# Patient Record
Sex: Male | Born: 1980 | Hispanic: Yes | Marital: Single | Smoking: Never smoker
Health system: Southern US, Community
[De-identification: ages and names within clinical notes are randomized; demographics above are authoritative.]

## PROBLEM LIST (undated history)

## (undated) DIAGNOSIS — I10 Essential (primary) hypertension: Secondary | ICD-10-CM

## (undated) DIAGNOSIS — I5020 Unspecified systolic (congestive) heart failure: Secondary | ICD-10-CM

## (undated) HISTORY — DX: Essential (primary) hypertension: I10

## (undated) HISTORY — DX: Unspecified systolic (congestive) heart failure: I50.20

---

## 2015-09-12 DIAGNOSIS — I10 Essential (primary) hypertension: Secondary | ICD-10-CM

## 2015-09-12 DIAGNOSIS — I5020 Unspecified systolic (congestive) heart failure: Secondary | ICD-10-CM

## 2015-09-12 HISTORY — DX: Essential (primary) hypertension: I10

## 2015-09-12 HISTORY — DX: Unspecified systolic (congestive) heart failure: I50.20

## 2018-03-04 ENCOUNTER — Ambulatory Visit
Admission: RE | Admit: 2018-03-04 | Discharge: 2018-03-04 | Disposition: A | Discharge status: Home / Self Care | Payer: No Typology Code available for payment source | Source: Ambulatory Visit | Attending: Nurse Practitioner | Admitting: Nurse Practitioner

## 2018-03-04 ENCOUNTER — Other Ambulatory Visit: Payer: Self-pay | Admitting: Nurse Practitioner

## 2018-03-04 DIAGNOSIS — R062 Wheezing: Secondary | ICD-10-CM

## 2018-03-05 ENCOUNTER — Telehealth: Payer: Self-pay | Admitting: Cardiology

## 2018-03-05 NOTE — Telephone Encounter (Signed)
Received records from [facility] on 03/05/18, Appt 03/29/18 @ 2:40PM. [place]

## 2018-03-29 ENCOUNTER — Encounter: Payer: Self-pay | Admitting: Cardiology

## 2018-03-29 ENCOUNTER — Ambulatory Visit (INDEPENDENT_AMBULATORY_CARE_PROVIDER_SITE_OTHER): Payer: Self-pay | Admitting: Cardiology

## 2018-03-29 VITALS — BP 148/104 | HR 73 | Ht 62.5 in | Wt 271.6 lb

## 2018-03-29 DIAGNOSIS — I5022 Chronic systolic (congestive) heart failure: Secondary | ICD-10-CM

## 2018-03-29 DIAGNOSIS — I10 Essential (primary) hypertension: Secondary | ICD-10-CM | POA: Insufficient documentation

## 2018-03-29 DIAGNOSIS — I428 Other cardiomyopathies: Secondary | ICD-10-CM

## 2018-03-29 DIAGNOSIS — Z79899 Other long term (current) drug therapy: Secondary | ICD-10-CM

## 2018-03-29 DIAGNOSIS — I5032 Chronic diastolic (congestive) heart failure: Secondary | ICD-10-CM | POA: Insufficient documentation

## 2018-03-29 NOTE — Progress Notes (Signed)
Cardiology Office Note:    Date:  03/29/2018   ID:  Justin [name], DOB 12/25/1980, MRN [id number]  PCP:  Patient, No Pcp Per  Cardiologist:  Jodelle Red, MD PhD  Referring MD: Willaim Rayas, NP   Chief Complaint  Patient presents with  . Congestive Heart Failure    History of Present Illness:    Justin [name] is a 37 y.o. male with a hx of chronic systolic heart failure and hypertension seen as a new consult at the request of Charma Igo for management of heart failure.  Patient is Spanish speaking, and he brought someone with him to interpret.  He notes that in 2017 he developed leg edema and shortness of breath. He had PND and orthopnea to the point that he wasn't sleeping and could barely slightly recline in a chair. Weight was up, unclear how much. Went to [facility] (see below) and was found to have hypertensive urgency, severe LVH, and severe systolic heart failure. After discharge he does not believe he saw a cardiologist. Since that time, he feels much improved. No chest pain or shortness of breath, no limitations to his walking or physical activity. Actively working as a Education administrator. No PND or orthopnea, rare LE edema. Takes BP at home, notes that it can range low or high. No headaches or vision changes. No tobacco, rare alcohol.   Records reviewed from [facility]: Labs 04/04/17: Tchol 201, HDL 38, LDL 130, TG 166. TSH 1.558, CBC with nl white and red cell count, UA with ketones and protein.  Notes from [facility] from 06/2016: admission for acute systolic HF and hypertensive urgency. Echo showed severe LV dysfunction, severe LCH. Cath done showed no coronary disease and an LV EDP of 43 mmHg.   Past Medical History:  Diagnosis Date  . Heart failure with reduced ejection fraction [facility]) 2017   [facility]  . Hypertension 2017   [facility]     History reviewed. No pertinent surgical  history.  Current Medications: Current Outpatient Medications on File Prior to Visit  Medication Sig  . carvedilol (COREG) 25 MG tablet Take 25 mg by mouth 2 (two) times daily with a meal.  . furosemide (LASIX) 40 MG tablet Take 40 mg by mouth daily.  Marland Kitchen lisinopril (PRINIVIL,ZESTRIL) 20 MG tablet Take 20 mg by mouth 2 (two) times daily.   Marland Kitchen spironolactone (ALDACTONE) 25 MG tablet Take 25 mg by mouth daily.   No current facility-administered medications on file prior to visit.      Allergies:   Patient has no allergy information on record.   Social History   Socioeconomic History  . Marital status: Single    Spouse name: Not on file  . Number of children: Not on file  . Years of education: Not on file  . Highest education level: Not on file  Occupational History  . Not on file  Social Needs  . Financial resource strain: Not on file  . Food insecurity:    Worry: Not on file    Inability: Not on file  . Transportation needs:    Medical: Not on file    Non-medical: Not on file  Tobacco Use  . Smoking status: Never Smoker  . Smokeless tobacco: Never Used  Substance and Sexual Activity  . Alcohol use: Not on file  . Drug use: Not on file  . Sexual activity: Not on file  Lifestyle  . Physical activity:  Days per week: Not on file    Minutes per session: Not on file  . Stress: Not on file  Relationships  . Social connections:    Talks on phone: Not on file    Gets together: Not on file    Attends religious service: Not on file    Active member of club or organization: Not on file    Attends meetings of clubs or organizations: Not on file    Relationship status: Not on file  Other Topics Concern  . Not on file  Social History Narrative  . Not on file     Family History: The patient's family history includes Hypertension in his father. No history of heart failure.  ROS:   Please see the history of present illness.  Additional pertinent ROS: Review of Systems   Constitutional: Negative for chills, fever, malaise/fatigue and weight loss.  HENT: Negative for ear pain and hearing loss.   Eyes: Negative for blurred vision and pain.  Respiratory: Negative for cough, shortness of breath and wheezing.   Cardiovascular: Negative for chest pain, palpitations, orthopnea, claudication, leg swelling and PND.  Gastrointestinal: Negative for abdominal pain, blood in stool and melena.  Genitourinary: Negative for hematuria.  Musculoskeletal: Negative for falls, joint pain and myalgias.  Skin: Negative for rash.  Neurological: Negative for focal weakness, loss of consciousness and headaches.  Endo/Heme/Allergies: Does not bruise/bleed easily.     EKGs/Labs/Other Studies Reviewed:    The following studies were reviewed today: See in HPI  EKG:  EKG is ordered today.  The ekg ordered today demonstrates normal sinus rhythm with poor R wave progression.  Recent Labs: No results found for requested labs within last 8760 hours.  Recent Lipid Panel No results found for: CHOL, TRIG, HDL, CHOLHDL, VLDL, LDLCALC, LDLDIRECT  Physical Exam:    VS:  BP (!) 148/104   Pulse 73   Ht 5' 2.5" (1.588 m) Comment: 2  Wt 271 lb 9.6 oz (123.2 kg)   BMI 48.88 kg/m     Wt Readings from Last 3 Encounters:  03/29/18 271 lb 9.6 oz (123.2 kg)    GEN: Well nourished, well developed in no acute distress HEENT: Normal NECK: No JVD; No carotid bruits LYMPHATICS: No lymphadenopathy CARDIAC: regular rhythm, normal S1 and S2, no murmurs, rubs, gallops. Radial and DP pulses 2+ bilaterally. RESPIRATORY:  Clear to auscultation without rales, wheezing or rhonchi  ABDOMEN: Soft, non-tender, non-distended MUSCULOSKELETAL:  Trace lower extremity bilateral edema; No deformity  SKIN: Warm and dry NEUROLOGIC:  Alert and oriented x 3 PSYCHIATRIC:  Normal affect   ASSESSMENT:    1. Chronic systolic heart failure (HCC)   2. Medication management   3. Essential hypertension     PLAN:    1. Chronic systolic heart failure: -on furosemide 40 mg daily, spironolactone 25 mg daily, carvedilol 25 mg BID, lisinopril 20 mg BID. -BP still elevated, have room for titration -will check labs today for renal function/potassium, magnesium, TSH, lipids, and A1c for medication management and risk factor analysis -will check echo, as this has not been done since his symptoms improved. Based on results, if EF still depressed, will need to adjust management. Would like him to be on Entresto. Discussed with him, he is amenable. Will need to do >36 hour washout of lisinopril first. Will have him work with Raquel for titration of Entresto. -if his EF is below 35%, will need to discuss possible ICD, need to have risk/benefit discussion as nonischemic cardiomyopathy  is the cause. -will refer to nutrition for assistance with low sodium diet. Would also benefit from weight reduction.  2. Hypertension: may be likely cause of his cardiomyopathy. Still elevated over goal -would like to titrate to entresto first -can also consider hydralazine/isordil -if continues to be resistant, will need to evaluate for additional causes of hypertension.  Plan for follow up: echo, labs, 2-4 weeks with PharmD, 2-3 months with me.  Medication Adjustments/Labs and Tests Ordered: Current medicines are reviewed at length with the patient today.  Concerns regarding medicines are outlined above.  Orders Placed This Encounter  Procedures  . Basic metabolic panel  . Magnesium  . TSH  . Lipid panel  . Hemoglobin A1c  . CBC  . ECHOCARDIOGRAM COMPLETE   No orders of the defined types were placed in this encounter.   Patient Instructions  Medication Instructions: Your physician recommends that you continue on your current medications as directed.    If you need a refill on your cardiac medications before your next appointment, please call your pharmacy.   Labwork: Your physician recommends that you  return for lab work in: Today BMP, Mg, TSH, Lipid, A1C, CBC   Procedures/Testing: Your physician has requested that you have an echocardiogram. Echocardiography is a painless test that uses sound waves to create images of your heart. It provides your doctor with information about the size and shape of your heart and how well your heart's chambers and valves are working. This procedure takes approximately one hour. There are no restrictions for this procedure. [address]   Follow-Up: Your physician wants you to follow-up in 2-4 weeks with Pharm D(Raquel) and 2-3 months with Dr. Cristal Deer.     Special Instructions:    Thank you for choosing Heartcare at [facility]!!       Signed, Jodelle Red, MD PhD 03/29/2018 5:35 PM    [facility]

## 2018-03-29 NOTE — Patient Instructions (Signed)
Medication Instructions: Your physician recommends that you continue on your current medications as directed.    If you need a refill on your cardiac medications before your next appointment, please call your pharmacy.   Labwork: Your physician recommends that you return for lab work in: Today BMP, Mg, TSH, Lipid, A1C, CBC   Procedures/Testing: Your physician has requested that you have an echocardiogram. Echocardiography is a painless test that uses sound waves to create images of your heart. It provides your doctor with information about the size and shape of your heart and how well your heart's chambers and valves are working. This procedure takes approximately one hour. There are no restrictions for this procedure. [address]   Follow-Up: Your physician wants you to follow-up in 2-4 weeks with Pharm D(Raquel) and 2-3 months with Dr. Cristal Deer.     Special Instructions:    Thank you for choosing Heartcare at [facility]!!

## 2018-03-30 LAB — BASIC METABOLIC PANEL
BUN / CREAT RATIO: 19 (ref 9–20)
BUN: 23 mg/dL — AB (ref 6–20)
CO2: 22 mmol/L (ref 20–29)
CREATININE: 1.22 mg/dL (ref 0.76–1.27)
Calcium: 10.1 mg/dL (ref 8.7–10.2)
Chloride: 102 mmol/L (ref 96–106)
GFR calc non Af Amer: 75 mL/min/{1.73_m2} (ref >59)
GFR, EST AFRICAN AMERICAN: 86 mL/min/{1.73_m2} (ref >59)
GLUCOSE: 88 mg/dL (ref 65–99)
POTASSIUM: 4.5 mmol/L (ref 3.5–5.2)
Sodium: 142 mmol/L (ref 134–144)

## 2018-03-30 LAB — CBC
Hematocrit: 47.3 % (ref 37.5–51.0)
Hemoglobin: 15.5 g/dL (ref 13.0–17.7)
MCH: 28.1 pg (ref 26.6–33.0)
MCHC: 32.8 g/dL (ref 31.5–35.7)
MCV: 86 fL (ref 79–97)
PLATELETS: 227 10*3/uL (ref 150–450)
RBC: 5.52 x10E6/uL (ref 4.14–5.80)
RDW: 13.8 % (ref 12.3–15.4)
WBC: 6.8 10*3/uL (ref 3.4–10.8)

## 2018-03-30 LAB — LIPID PANEL
CHOL/HDL RATIO: 6.5 ratio — AB (ref 0.0–5.0)
CHOLESTEROL TOTAL: 254 mg/dL — AB (ref 100–199)
HDL: 39 mg/dL — ABNORMAL LOW (ref >39)
Triglycerides: 562 mg/dL (ref 0–149)

## 2018-03-30 LAB — HEMOGLOBIN A1C
ESTIMATED AVERAGE GLUCOSE: 131 mg/dL
Hgb A1c MFr Bld: 6.2 % — ABNORMAL HIGH (ref 4.8–5.6)

## 2018-03-30 LAB — TSH: TSH: 1.79 u[IU]/mL (ref 0.450–4.500)

## 2018-03-30 LAB — MAGNESIUM: Magnesium: 2 mg/dL (ref 1.6–2.3)

## 2018-04-01 NOTE — Addendum Note (Signed)
Addended by: Chana Bode on: 04/01/2018 03:13 PM   Modules accepted: Orders

## 2018-04-02 ENCOUNTER — Telehealth: Payer: Self-pay

## 2018-04-02 NOTE — Telephone Encounter (Signed)
-----   Message from Jodelle Red, MD sent at 03/31/2018 11:05 AM EDT ----- Results reviewed. A1c is borderline, not yet diabetic but should be monitored. Triglycerides are very high. Would like him to come back and do a fasting lipid panel (or he can have done at his clinic and faxed to [place]). Important that he is truly fasting so we can get a better sense of his lipids. Thyroid, CBC and renal studies largely normal. Thanks!

## 2018-04-02 NOTE — Telephone Encounter (Signed)
Left message for pt to call back for lab results.

## 2018-04-03 ENCOUNTER — Other Ambulatory Visit (HOSPITAL_COMMUNITY): Payer: Self-pay

## 2018-04-03 ENCOUNTER — Other Ambulatory Visit: Payer: Self-pay

## 2018-04-03 DIAGNOSIS — R899 Unspecified abnormal finding in specimens from other organs, systems and tissues: Secondary | ICD-10-CM

## 2018-04-04 ENCOUNTER — Telehealth: Payer: Self-pay

## 2018-04-04 NOTE — Telephone Encounter (Signed)
Pt is having understanding over the phone. Requested a Nurse, learning disability and for [place] to call back.

## 2018-04-04 NOTE — Telephone Encounter (Signed)
LMOM; patient o call back an discuss lab results. Left message for patient to repeat FASTING blood work on 04/16/18 during appointment.

## 2018-04-12 ENCOUNTER — Other Ambulatory Visit: Payer: Self-pay

## 2018-04-12 ENCOUNTER — Ambulatory Visit (HOSPITAL_COMMUNITY): Payer: Self-pay | Attending: Cardiovascular Disease

## 2018-04-12 DIAGNOSIS — I110 Hypertensive heart disease with heart failure: Secondary | ICD-10-CM | POA: Insufficient documentation

## 2018-04-12 DIAGNOSIS — I5022 Chronic systolic (congestive) heart failure: Secondary | ICD-10-CM | POA: Insufficient documentation

## 2018-04-16 NOTE — Progress Notes (Deleted)
Patient ID: Justin [name]                 DOB: 1981-05-28                      MRN: [id number]     HPI: Justin [name] is a 37 y.o. male referred by Dr. Jenkins [name] to HTN and lipid clinic. PMH includes heart failure and uncontrolled hypertension.  Recent ECHO on 04/12/2018 shows EF of 55% but still showing very thick wall, likely due to uncontrol high blood pressure.    Current HTN meds:  Lisinopril 20mg  daily Carvedilol 25mg  twice daily Furosemide 40mg  daily Spironolactone 25mg  daily  Intolerance: none  BP goal: 130/80  Family History:   Social History:   Diet:   Exercise:   Home BP readings:   Relevant Laboratories: A1C = 6.2 (pre-diabetic) 03/30/18: CHO 254; TG 562; HDL 39; LDL-c unable to calculate  Wt Readings from Last 3 Encounters:  03/29/18 271 lb 9.6 oz (123.2 kg)   BP Readings from Last 3 Encounters:  03/29/18 (!) 148/104   Pulse Readings from Last 3 Encounters:  03/29/18 73    Renal function: CrCl cannot be calculated (Unknown ideal weight.).  Past Medical History:  Diagnosis Date  . Heart failure with reduced ejection fraction [facility]) 2017   [facility]  . Hypertension 2017   [facility]    Current Outpatient Medications on File Prior to Visit  Medication Sig Dispense Refill  . carvedilol (COREG) 25 MG tablet Take 25 mg by mouth 2 (two) times daily with a meal.    . furosemide (LASIX) 40 MG tablet Take 40 mg by mouth daily.    Marland Kitchen lisinopril (PRINIVIL,ZESTRIL) 20 MG tablet Take 20 mg by mouth 2 (two) times daily.     Marland Kitchen spironolactone (ALDACTONE) 25 MG tablet Take 25 mg by mouth daily.     No current facility-administered medications on file prior to visit.     Not on File  There were no vitals taken for this visit.  No problem-specific Assessment & Plan notes found for this encounter.

## 2018-04-17 NOTE — Telephone Encounter (Signed)
See updated encounter

## 2018-04-18 ENCOUNTER — Encounter: Payer: Self-pay | Admitting: Pharmacist

## 2018-04-18 ENCOUNTER — Ambulatory Visit (INDEPENDENT_AMBULATORY_CARE_PROVIDER_SITE_OTHER): Payer: Self-pay | Admitting: Pharmacist

## 2018-04-18 VITALS — BP 190/110 | HR 70 | Ht 62.0 in | Wt 270.0 lb

## 2018-04-18 DIAGNOSIS — I10 Essential (primary) hypertension: Secondary | ICD-10-CM

## 2018-04-18 DIAGNOSIS — E781 Pure hyperglyceridemia: Secondary | ICD-10-CM | POA: Insufficient documentation

## 2018-04-18 DIAGNOSIS — R899 Unspecified abnormal finding in specimens from other organs, systems and tissues: Secondary | ICD-10-CM

## 2018-04-18 LAB — LIPID PANEL
CHOLESTEROL TOTAL: 273 mg/dL — AB (ref 100–199)
Chol/HDL Ratio: 7.2 ratio — ABNORMAL HIGH (ref 0.0–5.0)
HDL: 38 mg/dL — ABNORMAL LOW (ref >39)
Triglycerides: 455 mg/dL — ABNORMAL HIGH (ref 0–149)

## 2018-04-18 MED ORDER — SPIRONOLACTONE 25 MG PO TABS: 50.0000 mg | ORAL_TABLET | Freq: Every day | ORAL | 1 refills | Status: DC

## 2018-04-18 MED ORDER — FENOFIBRATE 54 MG PO TABS: 54.0000 mg | ORAL_TABLET | Freq: Every day | ORAL | 1 refills | Status: DC

## 2018-04-18 NOTE — Progress Notes (Signed)
pPatient ID: Justin [name]                 DOB: August 05, 1981                      MRN: [id number]     HPI: Justin [name] is a 37 y.o. male referred by Dr. Cristal [name] to pharmacist clinic. PMH includes uncontrolled hypertension, HF with EF of 55%, and hyperlipidemia. Patient presents for HTN and lipid medication management. He works 6 days per week with Sundays off. Reports interest on improving his health but will need help with diet changes and lifestyle modification. He report drinking significant amount of beer every weekend but denies tobacco or other drugs.   Justin [name] is under the impression his BP of 180/110 is "good" due to prior readings in the 200's systolic. He remains asymptomatic during episode of extremely elevated BP. Denies chest pains, headaches, or blurry vision.   Current HTN meds: Carvedilol 25mg  twice daily Furosemide 40mg  daily Lisinopril 20mg  twice daily Spironolactone 25mg  daily   BP goal: 130/80  Family History: denies family history of heart disease of elevated BP  Social History: denies tobacco use, working on decreasing alcohol intake  Diet: working on low salt and low fat diet - will need help identifying proper changes. Likes [place] and [place] food.  Exercise: work 6 days per week  Home BP readings: 180/110 - 3-4 weeks ago (patient under the impression this is a "good" reading  Relevant labs:  04/18/2018: CHO 273; TG 455; HDL 38; LDL unable to calculate (fasting) 03/29/18: CHO 254; TG 562; HLD 39; LDL-c unable to calculate (non-fasting)  Wt Readings from Last 3 Encounters:  04/18/18 270 lb (122.5 kg)  03/29/18 271 lb 9.6 oz (123.2 kg)   BP Readings from Last 3 Encounters:  04/18/18 (!) 190/110  03/29/18 (!) 148/104   Pulse Readings from Last 3 Encounters:  04/18/18 70  03/29/18 73    Past Medical History:  Diagnosis Date  . Heart failure with reduced ejection fraction [facility]) 2017   [facility]  . Hypertension 2017    [facility]    Current Outpatient Medications on File Prior to Visit  Medication Sig Dispense Refill  . carvedilol (COREG) 25 MG tablet Take 25 mg by mouth 2 (two) times daily with a meal.    . furosemide (LASIX) 40 MG tablet Take 40 mg by mouth daily.    Marland Kitchen lisinopril (PRINIVIL,ZESTRIL) 20 MG tablet Take 20 mg by mouth 2 (two) times daily.      No current facility-administered medications on file prior to visit.     Blood pressure (!) 190/110, pulse 70, height 5\' 2"  (1.575 m), weight 270 lb (122.5 kg).  Hypertriglyceridemia TG above 400mg /dL on fasting blood work. Vascepa 2gm is a good therapeutic choice but he pays all medication out of pocket (cash). Will initiate fenofibrate 54mg  daily and encourage diet changes. Plan to repeat fasting lipid profile in 8-12 weeks as follow up. Patient already scheduled for follow up with Dr Justin [name] in 3 weeks.  Essential hypertension BP extremely elevated above goal of 130/80, but improved from previous readings of systolic > 200s. Patient denies symptoms of blurry vision, swelling, chest pain, increased fatigue, dizziness or headaches. Patient is genuinely concern about his health and express interest in working to better his overall health.   Lisinopril and carvedilol are already at maximum daily doses. Will increase spironolactone to 50mg  daily and repeat BMET  in 2 weeks. Plan to change lisinopril to valsartan 320mg  during next office visit if additional BP control needed and renal function remains appropriate.  Will consider adding hydralazine to therapy but any TID regimen is difficult follow due to work schedule. Doxazosin 4mg  every evening may be a better alternative once ACEi/ARB, diuretic and BB therapy maximized.  We also discuss decreasing amount of beer to not more than 1 beer per week, decrease salt in diet, and avoid ALL [place] or [place] food.    [name] PharmD, BCPS, CPP [facility]  [facility] [address] [place] [address] 04/18/2018 5:45 PM

## 2018-04-18 NOTE — Assessment & Plan Note (Signed)
TG above 400mg /dL on fasting blood work. Vascepa 2gm is a good therapeutic choice but he pays all medication out of pocket (cash). Will initiate fenofibrate 54mg  daily and encourage diet changes. Plan to repeat fasting lipid profile in 8-12 weeks as follow up. Patient already scheduled for follow up with Dr Cristal Deer in 3 weeks.

## 2018-04-18 NOTE — Patient Instructions (Addendum)
*   Cambie dosis de SPIRONOLACTONE a 50mg  (2 tabletas) diarios*  *MEDICAMENTO NUEVO fenofibrate 54mg  diarios*  * Repetir prueba de sangre en 2 semanas*

## 2018-04-18 NOTE — Assessment & Plan Note (Addendum)
BP extremely elevated above goal of 130/80, but improved from previous readings of systolic > 200s. Patient denies symptoms of blurry vision, swelling, chest pain, increased fatigue, dizziness or headaches. Patient is genuinely concern about his health and express interest in working to better his overall health.   Lisinopril and carvedilol are already at maximum daily doses. Will increase spironolactone to 50mg  daily and repeat BMET in 2 weeks. Plan to change lisinopril to valsartan 320mg  during next office visit if additional BP control needed and renal function remains appropriate.  Will consider adding hydralazine to therapy but any TID regimen is difficult follow due to work schedule. Doxazosin 4mg  every evening may be a better alternative once ACEi/ARB, diuretic and BB therapy maximized.  We also discuss decreasing amount of beer to not more than 1 beer per week, decrease salt in diet, and avoid ALL [place] or [place] food.

## 2018-04-26 LAB — BASIC METABOLIC PANEL
BUN / CREAT RATIO: 16 (ref 9–20)
BUN: 23 mg/dL — AB (ref 6–20)
CHLORIDE: 101 mmol/L (ref 96–106)
CO2: 23 mmol/L (ref 20–29)
Calcium: 10.3 mg/dL — ABNORMAL HIGH (ref 8.7–10.2)
Creatinine, Ser: 1.45 mg/dL — ABNORMAL HIGH (ref 0.76–1.27)
GFR calc Af Amer: 70 mL/min/{1.73_m2} (ref >59)
GFR calc non Af Amer: 61 mL/min/{1.73_m2} (ref >59)
GLUCOSE: 103 mg/dL — AB (ref 65–99)
Potassium: 4.6 mmol/L (ref 3.5–5.2)
Sodium: 140 mmol/L (ref 134–144)

## 2018-05-30 ENCOUNTER — Ambulatory Visit (INDEPENDENT_AMBULATORY_CARE_PROVIDER_SITE_OTHER): Payer: Self-pay | Admitting: Cardiology

## 2018-05-30 ENCOUNTER — Encounter: Payer: Self-pay | Admitting: Cardiology

## 2018-05-30 VITALS — BP 140/100 | HR 76 | Ht 62.0 in | Wt 271.4 lb

## 2018-05-30 DIAGNOSIS — E781 Pure hyperglyceridemia: Secondary | ICD-10-CM

## 2018-05-30 DIAGNOSIS — I5042 Chronic combined systolic (congestive) and diastolic (congestive) heart failure: Secondary | ICD-10-CM

## 2018-05-30 DIAGNOSIS — I428 Other cardiomyopathies: Secondary | ICD-10-CM

## 2018-05-30 DIAGNOSIS — I10 Essential (primary) hypertension: Secondary | ICD-10-CM

## 2018-05-30 DIAGNOSIS — Z7189 Other specified counseling: Secondary | ICD-10-CM

## 2018-05-30 MED ORDER — FUROSEMIDE 40 MG PO TABS: 40.0000 mg | ORAL_TABLET | Freq: Every day | ORAL | 3 refills | Status: DC

## 2018-05-30 MED ORDER — CARVEDILOL 25 MG PO TABS: 25.0000 mg | ORAL_TABLET | Freq: Two times a day (BID) | ORAL | 3 refills | Status: DC

## 2018-05-30 MED ORDER — LISINOPRIL 20 MG PO TABS: 20.0000 mg | ORAL_TABLET | Freq: Two times a day (BID) | ORAL | 3 refills | Status: DC

## 2018-05-30 MED ORDER — SPIRONOLACTONE 25 MG PO TABS: 50.0000 mg | ORAL_TABLET | Freq: Every day | ORAL | 3 refills | Status: DC

## 2018-05-30 MED ORDER — FENOFIBRATE 54 MG PO TABS: 54.0000 mg | ORAL_TABLET | Freq: Every day | ORAL | 3 refills | Status: DC

## 2018-05-30 NOTE — Patient Instructions (Signed)
Medication Instructions: Your physician recommends that you continue on your current medications as directed.    If you need a refill on your cardiac medications before your next appointment, please call your pharmacy.   Labwork: None  Procedures/Testing: None  Follow-Up: Your physician wants you to follow-up in 3 months with Dr. Christopher.   Special Instructions:    Thank you for choosing Heartcare at [facility]!!    

## 2018-05-30 NOTE — Progress Notes (Signed)
Cardiology Office Note:    Date:  05/30/2018   ID:  Justin [name], DOB 1980-11-23, MRN [id number]  PCP:  Patient, No Pcp Per  Cardiologist:  Jodelle Red, MD PhD  Referring MD: No ref. provider found   CC: hypertension follow up.  History of Present Illness:    Justin [name] is a 37 y.o. male with a hx of chronic systolic heart failure and hypertension presents today for follow up.  Patient is Spanish speaking, and he brought someone with him to interpret.  Last seen in the hypertension clinic on 04/18/18. Bps still in the 180s/190s systolic, which he thought was a good target given that his priors were over 200.   Today: occaisonally lightheaded, doesn't take his BP at that time. Working on avoiding salt and cutting down on beer. Doesn't weigh himself at home. No edema, SOB, PND, orthopnea, chest pain.  Cardiac history: He notes that in 2017 he developed leg edema and shortness of breath. He had PND and orthopnea to the point that he wasn't sleeping and could barely slightly recline in a chair. Weight was up, unclear how much. Went to [facility] (see below) and was found to have hypertensive urgency, severe LVH, and severe systolic heart failure. After discharge he does not believe he saw a cardiologist. Notes from [facility] from 06/2016: admission for acute systolic HF and hypertensive urgency. Echo showed severe LV dysfunction, severe LCH. Cath done showed no coronary disease and an LV EDP of 43 mmHg.   Past Medical History:  Diagnosis Date  . Heart failure with reduced ejection fraction [facility]) 2017   [facility]  . Hypertension 2017   [facility]     No past surgical history on file.  Current Medications: Current Outpatient Medications on File Prior to Visit  Medication Sig  . carvedilol (COREG) 25 MG tablet Take 25 mg by mouth 2 (two) times daily with a meal.  . fenofibrate 54 MG tablet Take 1 tablet (54 mg  total) by mouth daily.  . furosemide (LASIX) 40 MG tablet Take 40 mg by mouth daily.  Marland Kitchen lisinopril (PRINIVIL,ZESTRIL) 20 MG tablet Take 20 mg by mouth 2 (two) times daily.   Marland Kitchen spironolactone (ALDACTONE) 25 MG tablet Take 2 tablets (50 mg total) by mouth daily.   No current facility-administered medications on file prior to visit.      Allergies:   Patient has no allergy information on record.   Social History   Socioeconomic History  . Marital status: Single    Spouse name: Not on file  . Number of children: Not on file  . Years of education: Not on file  . Highest education level: Not on file  Occupational History  . Not on file  Social Needs  . Financial resource strain: Not on file  . Food insecurity:    Worry: Not on file    Inability: Not on file  . Transportation needs:    Medical: Not on file    Non-medical: Not on file  Tobacco Use  . Smoking status: Never Smoker  . Smokeless tobacco: Never Used  Substance and Sexual Activity  . Alcohol use: Not on file  . Drug use: Not on file  . Sexual activity: Not on file  Lifestyle  . Physical activity:    Days per week: Not on file    Minutes per session: Not on file  . Stress: Not on file  Relationships  . Social  connections:    Talks on phone: Not on file    Gets together: Not on file    Attends religious service: Not on file    Active member of club or organization: Not on file    Attends meetings of clubs or organizations: Not on file    Relationship status: Not on file  Other Topics Concern  . Not on file  Social History Narrative  . Not on file     Family History: The patient's family history includes Hypertension in his father. No history of heart failure.  ROS:   Please see the history of present illness.  Additional pertinent ROS: Review of Systems  Constitutional: Negative for chills, fever, malaise/fatigue and weight loss.  HENT: Negative for ear pain and hearing loss.   Eyes: Negative for blurred  vision and pain.  Respiratory: Negative for cough, shortness of breath and wheezing.   Cardiovascular: Negative for chest pain, palpitations, orthopnea, claudication, leg swelling and PND.  Gastrointestinal: Negative for abdominal pain, blood in stool and melena.  Genitourinary: Negative for hematuria.  Musculoskeletal: Negative for falls, joint pain and myalgias.  Skin: Negative for rash.  Neurological: Negative for focal weakness, loss of consciousness and headaches.  Endo/Heme/Allergies: Does not bruise/bleed easily.     EKGs/Labs/Other Studies Reviewed:    The following studies were reviewed today: Echo 04/12/18 - Left ventricle: The cavity size was normal. Wall thickness was   increased in a pattern of severe LVH. Systolic function was   normal. The estimated ejection fraction was in the range of 55%   to 60%. The study is not technically sufficient to allow   evaluation of LV diastolic function. - Left atrium: The atrium was mildly dilated.  EKG:  Previously reviewed, no new  Recent Labs: 03/29/2018: Hemoglobin 15.5; Magnesium 2.0; Platelets 227; TSH 1.790 04/25/2018: BUN 23; Creatinine, Ser 1.45; Potassium 4.6; Sodium 140  Recent Lipid Panel    Component Value Date/Time   CHOL 273 (H) 04/18/2018 1133   TRIG 455 (H) 04/18/2018 1133   HDL 38 (L) 04/18/2018 1133   CHOLHDL 7.2 (H) 04/18/2018 1133   LDLCALC Comment 04/18/2018 1133    Physical Exam:    VS:  BP (!) 140/100   Pulse 76   Ht 5\' 2"  (1.575 m)   Wt 271 lb 6.4 oz (123.1 kg)   BMI 49.64 kg/m     Wt Readings from Last 3 Encounters:  04/18/18 270 lb (122.5 kg)  03/29/18 271 lb 9.6 oz (123.2 kg)    GEN: Well nourished, well developed in no acute distress HEENT: Normal NECK: No JVD; No carotid bruits LYMPHATICS: No lymphadenopathy CARDIAC: regular rhythm, normal S1 and S2, no murmurs, rubs, gallops. Radial and DP pulses 2+ bilaterally. RESPIRATORY:  Clear to auscultation without rales, wheezing or rhonchi    ABDOMEN: Soft, non-tender, non-distended MUSCULOSKELETAL:  Trace lower extremity bilateral edema; No deformity  SKIN: Warm and dry NEUROLOGIC:  Alert and oriented x 3 PSYCHIATRIC:  Normal affect   ASSESSMENT:    1. Essential hypertension   2. Nonischemic cardiomyopathy (HCC)   3. Encounter for education about heart failure   4. Chronic combined systolic and diastolic heart failure (HCC)    PLAN:    1. Chronic systolic and diastolic heart heart failure: Had previously low EF, now improving with BP control -on furosemide 40 mg daily, spironolactone 50 mg daily, carvedilol 25 mg BID, lisinopril 20 mg BID. -reviewed heart failure teaching again today. Avoidance of salt, alcohol. Reviewed  diet and exercise goals. Reviewed daily weights.  2. Hypertension: much improved from prior, though still above goal. He is having intermittent lightheadedness, so will hold on titration for now. -continue medications as above -counseled on checking BP outside of the office. He [name]'t do it during his job usually, but encouraged him to check at the drug store, etc occasionally, especially if he is feeling poorly. They aren't the most accurate cuffs, but it will give [place] a range of where we are -keep PharmD follow up appt on 06/13/18  3. Hypertriglyceridemia: cannot afford vascepa. Tolerating fenofibrate. Discussed diet and exercise. Weight loss would also be important given his BMI of nearly 50. I get the sense he is a bit overwhelmed with everything at the moment. Will continue to address.  Plan for follow up: 3 mos with me  Medication Adjustments/Labs and Tests Ordered: Current medicines are reviewed at length with the patient today.  Concerns regarding medicines are outlined above.  No orders of the defined types were placed in this encounter.  Meds ordered this encounter  Medications  . carvedilol (COREG) 25 MG tablet    Sig: Take 1 tablet (25 mg total) by mouth 2 (two) times daily with a meal.     Dispense:  180 tablet    Refill:  3  . fenofibrate 54 MG tablet    Sig: Take 1 tablet (54 mg total) by mouth daily.    Dispense:  90 tablet    Refill:  3  . furosemide (LASIX) 40 MG tablet    Sig: Take 1 tablet (40 mg total) by mouth daily.    Dispense:  90 tablet    Refill:  3  . lisinopril (PRINIVIL,ZESTRIL) 20 MG tablet    Sig: Take 1 tablet (20 mg total) by mouth 2 (two) times daily.    Dispense:  180 tablet    Refill:  3  . spironolactone (ALDACTONE) 25 MG tablet    Sig: Take 2 tablets (50 mg total) by mouth daily.    Dispense:  90 tablet    Refill:  3    Patient Instructions  Medication Instructions: Your physician recommends that you continue on your current medications as directed.    If you need a refill on your cardiac medications before your next appointment, please call your pharmacy.   Labwork: None  Procedures/Testing: None  Follow-Up: Your physician wants you to follow-up in 3 months with Dr. Cristal Deer  Special Instructions:    Thank you for choosing Heartcare at [facility]!!       Signed, Jodelle Red, MD PhD 05/30/2018 7:46 AM    [facility]

## 2018-05-31 ENCOUNTER — Encounter: Payer: Self-pay | Admitting: Cardiology

## 2018-06-13 ENCOUNTER — Ambulatory Visit: Payer: Self-pay

## 2018-06-13 NOTE — Progress Notes (Deleted)
pPatient ID: Justin [name]                 DOB: [birth year]                      MRN: [id number]     HPI: Justin [name] is a 37 y.o. male referred by Dr. Cristal Deer to pharmacist clinic. PMH includes uncontrolled hypertension, HF with EF of 55%, and hyperlipidemia. Patient presents for HTN and lipid medication management. He works 6 days per week with Sundays off. Reports interest on improving his health but will need help with diet changes and lifestyle modification. He report drinking significant amount of beer every weekend but denies tobacco or other drugs.    He remains asymptomatic during episode of extremely elevated BP. Denies chest pains, headaches, or blurry vision. Reports occasional feeling of lightheadedness but denies falls  Current HTN meds: Carvedilol 25mg  twice daily Furosemide 40mg  daily (take only as needed for edema) Lisinopril 20mg  twice daily (change to valsartan/HCT 160mg /12.5 BID) Spironolactone 50mg  daily   BP goal: 130/80  Family History: denies family history of heart disease of elevated BP  Social History: denies tobacco use, working on decreasing alcohol intake  Diet: working on low salt and low fat diet - will need help identifying proper changes. Likes [place] and [place] food.  Exercise: work 6 days per week  Home BP readings: 180/110 - 3-4 weeks ago (patient under the impression this is a "good" reading  Relevant labs:  04/18/2018: CHO 273; TG 455; HDL 38; LDL unable to calculate (fasting) 03/29/18: CHO 254; TG 562; HLD 39; LDL-c unable to calculate (non-fasting)  Wt Readings from Last 3 Encounters:  05/30/18 271 lb 6.4 oz (123.1 kg)  04/18/18 270 lb (122.5 kg)  03/29/18 271 lb 9.6 oz (123.2 kg)   BP Readings from Last 3 Encounters:  05/30/18 (!) 140/100  04/18/18 (!) 190/110  03/29/18 (!) 148/104   Pulse Readings from Last 3 Encounters:  05/30/18 76  04/18/18 70  03/29/18 73    Past Medical History:  Diagnosis Date  .  Heart failure with reduced ejection fraction [facility]) 2017   [facility]  . Hypertension 2017   [facility]    Current Outpatient Medications on File Prior to Visit  Medication Sig Dispense Refill  . carvedilol (COREG) 25 MG tablet Take 1 tablet (25 mg total) by mouth 2 (two) times daily with a meal. 180 tablet 3  . fenofibrate 54 MG tablet Take 1 tablet (54 mg total) by mouth daily. 90 tablet 3  . furosemide (LASIX) 40 MG tablet Take 1 tablet (40 mg total) by mouth daily. 90 tablet 3  . lisinopril (PRINIVIL,ZESTRIL) 20 MG tablet Take 1 tablet (20 mg total) by mouth 2 (two) times daily. 180 tablet 3  . spironolactone (ALDACTONE) 25 MG tablet Take 2 tablets (50 mg total) by mouth daily. 90 tablet 3   No current facility-administered medications on file prior to visit.     There were no vitals taken for this visit.  No problem-specific Assessment & Plan notes found for this encounter.  Ayman Brull Rodriguez-Guzman PharmD, BCPS, CPP [facility] [address] [place] [address] 06/13/2018 8:48 AM

## 2018-08-23 ENCOUNTER — Ambulatory Visit (INDEPENDENT_AMBULATORY_CARE_PROVIDER_SITE_OTHER): Payer: Self-pay | Admitting: Cardiology

## 2018-08-23 ENCOUNTER — Other Ambulatory Visit: Payer: Self-pay

## 2018-08-23 ENCOUNTER — Encounter: Payer: Self-pay | Admitting: Cardiology

## 2018-08-23 VITALS — BP 126/70 | HR 81 | Ht 65.0 in | Wt 285.0 lb

## 2018-08-23 DIAGNOSIS — I10 Essential (primary) hypertension: Secondary | ICD-10-CM

## 2018-08-23 DIAGNOSIS — I428 Other cardiomyopathies: Secondary | ICD-10-CM

## 2018-08-23 DIAGNOSIS — Z79899 Other long term (current) drug therapy: Secondary | ICD-10-CM

## 2018-08-23 DIAGNOSIS — I5032 Chronic diastolic (congestive) heart failure: Secondary | ICD-10-CM

## 2018-08-23 DIAGNOSIS — E781 Pure hyperglyceridemia: Secondary | ICD-10-CM

## 2018-08-23 LAB — BASIC METABOLIC PANEL
BUN/Creatinine Ratio: 19 (ref 9–20)
BUN: 33 mg/dL — ABNORMAL HIGH (ref 6–20)
CALCIUM: 10.3 mg/dL — AB (ref 8.7–10.2)
CHLORIDE: 97 mmol/L (ref 96–106)
CO2: 23 mmol/L (ref 20–29)
Creatinine, Ser: 1.75 mg/dL — ABNORMAL HIGH (ref 0.76–1.27)
GFR calc Af Amer: 56 mL/min/{1.73_m2} — ABNORMAL LOW (ref >59)
GFR, EST NON AFRICAN AMERICAN: 48 mL/min/{1.73_m2} — AB (ref >59)
Glucose: 112 mg/dL — ABNORMAL HIGH (ref 65–99)
POTASSIUM: 5.1 mmol/L (ref 3.5–5.2)
Sodium: 137 mmol/L (ref 134–144)

## 2018-08-23 LAB — LIPID PANEL
Chol/HDL Ratio: 6.5 ratio — ABNORMAL HIGH (ref 0.0–5.0)
Cholesterol, Total: 265 mg/dL — ABNORMAL HIGH (ref 100–199)
HDL: 41 mg/dL (ref >39)
TRIGLYCERIDES: 460 mg/dL — AB (ref 0–149)

## 2018-08-23 NOTE — Patient Instructions (Addendum)
Medication Instructions:  Your Physician recommend you continue on your current medication as directed.    If you need a refill on your cardiac medications before your next appointment, please call your pharmacy.   Lab work: Your physician recommends that you return for lab work today (BMP, lipid)  If you have labs (blood work) drawn today and your tests are completely normal, you will receive your results only by: Marland Kitchen MyChart Message (if you have MyChart) OR . A paper copy in the mail If you have any lab test that is abnormal or we need to change your treatment, we will call you to review the results.  Testing/Procedures: None  Follow-Up: At [facility], you and your health needs are our priority.  As part of our continuing mission to provide you with exceptional heart care, we have created designated Provider Care Teams.  These Care Teams include your primary Cardiologist (physician) and Advanced Practice Providers (APPs -  Physician Assistants and Nurse Practitioners) who all work together to provide you with the care you need, when you need it. You will need a follow up appointment in 6 months.  Please call our office 2 months in advance to schedule this appointment.  You may see Jodelle Red, MD or one of the following Advanced Practice Providers on your designated Care Team:   Theodore Demark, PA-C . Joni Reining, DNP, ANP

## 2018-08-23 NOTE — Progress Notes (Signed)
Cardiology Office Note:    Date:  08/23/2018   ID:  Justin [name], DOB [birth year], MRN [id number]  PCP:  Patient, No Pcp Per  Cardiologist:  Jodelle Red, MD PhD  Referring MD: No ref. provider found   CC: hypertension follow up.  History of Present Illness:    Justin [name] is a 37 y.o. male with a hx of chronic systolic heart failure and hypertension presents today for follow up.  Cardiac history: He notes that in 2017 he developed leg edema and shortness of breath. He had PND and orthopnea to the point that he wasn't sleeping and could barely slightly recline in a chair. Weight was up, unclear how much. Went to [facility] (see below) and was found to have hypertensive urgency, severe LVH, and severe systolic heart failure. After discharge he does not believe he saw a cardiologist. Notes from [facility] from 06/2016: admission for acute systolic HF and hypertensive urgency. Echo showed severe LV dysfunction, severe LCH. Cath done showed no coronary disease and an LV EDP of 43 mmHg.   Patient is Spanish speaking, and he has a [employer] interpreter with him today.   Today: feeling well overall. No chest pain, shortness of breath, PND, orthopnea, LE edema, or syncope. Tolerating medications. Didn't take medications yet today but has been taking them routinely.   Past Medical History:  Diagnosis Date  . Heart failure with reduced ejection fraction [facility]) 2017   [facility]  . Hypertension 2017   [facility]     History reviewed. No pertinent surgical history.  Current Medications: Current Outpatient Medications on File Prior to Visit  Medication Sig  . carvedilol (COREG) 25 MG tablet Take 1 tablet (25 mg total) by mouth 2 (two) times daily with a meal.  . fenofibrate 54 MG tablet Take 1 tablet (54 mg total) by mouth daily.  . furosemide (LASIX) 40 MG tablet Take 1 tablet (40 mg total) by mouth daily.  Marland Kitchen lisinopril  (PRINIVIL,ZESTRIL) 20 MG tablet Take 1 tablet (20 mg total) by mouth 2 (two) times daily.  Marland Kitchen spironolactone (ALDACTONE) 25 MG tablet Take 2 tablets (50 mg total) by mouth daily.   No current facility-administered medications on file prior to visit.      Allergies:   Patient has no known allergies.   Social History   Socioeconomic History  . Marital status: Single    Spouse name: Not on file  . Number of children: Not on file  . Years of education: Not on file  . Highest education level: Not on file  Occupational History  . Not on file  Social Needs  . Financial resource strain: Not on file  . Food insecurity:    Worry: Not on file    Inability: Not on file  . Transportation needs:    Medical: Not on file    Non-medical: Not on file  Tobacco Use  . Smoking status: Never Smoker  . Smokeless tobacco: Never Used  Substance and Sexual Activity  . Alcohol use: Not on file  . Drug use: Not on file  . Sexual activity: Not on file  Lifestyle  . Physical activity:    Days per week: Not on file    Minutes per session: Not on file  . Stress: Not on file  Relationships  . Social connections:    Talks on phone: Not on file    Gets together: Not on file    Attends  religious service: Not on file    Active member of club or organization: Not on file    Attends meetings of clubs or organizations: Not on file    Relationship status: Not on file  Other Topics Concern  . Not on file  Social History Narrative  . Not on file     Family History: The patient's family history includes Hypertension in his father. No history of heart failure.  ROS:   Please see the history of present illness.  Additional pertinent ROS: Review of Systems  Constitutional: Negative for chills, fever, malaise/fatigue and weight loss.  HENT: Negative for ear pain and hearing loss.   Eyes: Negative for blurred vision and pain.  Respiratory: Negative for cough, shortness of breath and wheezing.     Cardiovascular: Negative for chest pain, palpitations, orthopnea, claudication, leg swelling and PND.  Gastrointestinal: Negative for abdominal pain, blood in stool and melena.  Genitourinary: Negative for hematuria.  Musculoskeletal: Negative for falls, joint pain and myalgias.  Skin: Negative for rash.  Neurological: Negative for focal weakness, loss of consciousness and headaches.  Endo/Heme/Allergies: Does not bruise/bleed easily.     EKGs/Labs/Other Studies Reviewed:    The following studies were reviewed today: Echo 04/12/18 - Left ventricle: The cavity size was normal. Wall thickness was   increased in a pattern of severe LVH. Systolic function was   normal. The estimated ejection fraction was in the range of 55%   to 60%. The study is not technically sufficient to allow   evaluation of LV diastolic function. - Left atrium: The atrium was mildly dilated.  EKG:  Previously reviewed, no new  Recent Labs: 03/29/2018: Hemoglobin 15.5; Magnesium 2.0; Platelets 227; TSH 1.790 04/25/2018: BUN 23; Creatinine, Ser 1.45; Potassium 4.6; Sodium 140  Recent Lipid Panel    Component Value Date/Time   CHOL 273 (H) 04/18/2018 1133   TRIG 455 (H) 04/18/2018 1133   HDL 38 (L) 04/18/2018 1133   CHOLHDL 7.2 (H) 04/18/2018 1133   LDLCALC Comment 04/18/2018 1133    Physical Exam:    VS:  BP 126/70   Pulse 81   Ht 5\' 5"  (1.651 m)   Wt 285 lb (129.3 kg)   BMI 47.43 kg/m     Wt Readings from Last 3 Encounters:  08/23/18 285 lb (129.3 kg)  05/30/18 271 lb 6.4 oz (123.1 kg)  04/18/18 270 lb (122.5 kg)    GEN: Well nourished, well developed in no acute distress HEENT: Normal NECK: No JVD; No carotid bruits LYMPHATICS: No lymphadenopathy CARDIAC: regular rhythm, normal S1 and S2, no murmurs, rubs, gallops. Radial and DP pulses 2+ bilaterally. RESPIRATORY:  Clear to auscultation without rales, wheezing or rhonchi  ABDOMEN: Soft, non-tender, non-distended MUSCULOSKELETAL:  Trace lower  extremity bilateral edema; No deformity  SKIN: Warm and dry NEUROLOGIC:  Alert and oriented x 3 PSYCHIATRIC:  Normal affect   ASSESSMENT:    1. Essential hypertension   2. Hypertriglyceridemia   3. Nonischemic cardiomyopathy (HCC)   4. Chronic diastolic heart failure (HCC)    PLAN:    1. Nonischemic cardiomyopathy, previously low EF now improved with BP control. Chronic diastolic heart failure:  -euvolemic and asymptomatic today -on furosemide 40 mg daily, spironolactone 50 mg daily, carvedilol 25 mg BID, lisinopril 20 mg BID. -check BMET today  2. Hypertension: at goal, and the best it has been thus far. Tolerating well. -continue medications as above  3. Hypertriglyceridemia: cannot afford vascepa. Tolerating fenofibrate. We have discussed diet, exercise,  and weight loss in the past. Will need to continue to discuss.  -he is fasting today, so will recheck TG  Plan for follow up: 6 mos, instructed to call sooner if needed  Medication Adjustments/Labs and Tests Ordered: Current medicines are reviewed at length with the patient today.  Concerns regarding medicines are outlined above.  Orders Placed This Encounter  Procedures  . Basic metabolic panel  . Lipid panel   No orders of the defined types were placed in this encounter.   Patient Instructions  Medication Instructions:  Your Physician recommend you continue on your current medication as directed.    If you need a refill on your cardiac medications before your next appointment, please call your pharmacy.   Lab work: Your physician recommends that you return for lab work today (BMP, lipid)  If you have labs (blood work) drawn today and your tests are completely normal, you will receive your results only by: Marland Kitchen MyChart Message (if you have MyChart) OR . A paper copy in the mail If you have any lab test that is abnormal or we need to change your treatment, we will call you to review the  results.  Testing/Procedures: None  Follow-Up: At [facility], you and your health needs are our priority.  As part of our continuing mission to provide you with exceptional heart care, we have created designated Provider Care Teams.  These Care Teams include your primary Cardiologist (physician) and Advanced Practice Providers (APPs -  Physician Assistants and Nurse Practitioners) who all work together to provide you with the care you need, when you need it. You will need a follow up appointment in 6 months.  Please call our office 2 months in advance to schedule this appointment.  You may see Jodelle Red, MD or one of the following Advanced Practice Providers on your designated Care Team:   Theodore Demark, PA-C . Joni Reining, DNP, ANP        Signed, Jodelle Red, MD PhD 08/23/2018 10:22 AM    [facility]

## 2018-09-06 ENCOUNTER — Other Ambulatory Visit: Payer: Self-pay | Admitting: *Deleted

## 2018-09-06 DIAGNOSIS — Z79899 Other long term (current) drug therapy: Secondary | ICD-10-CM

## 2018-09-06 LAB — BASIC METABOLIC PANEL
BUN/Creatinine Ratio: 17 (ref 9–20)
BUN: 23 mg/dL — ABNORMAL HIGH (ref 6–20)
CO2: 23 mmol/L (ref 20–29)
Calcium: 10.6 mg/dL — ABNORMAL HIGH (ref 8.7–10.2)
Chloride: 99 mmol/L (ref 96–106)
Creatinine, Ser: 1.39 mg/dL — ABNORMAL HIGH (ref 0.76–1.27)
GFR calc Af Amer: 74 mL/min/{1.73_m2} (ref >59)
GFR, EST NON AFRICAN AMERICAN: 64 mL/min/{1.73_m2} (ref >59)
Glucose: 111 mg/dL — ABNORMAL HIGH (ref 65–99)
Potassium: 5.1 mmol/L (ref 3.5–5.2)
SODIUM: 138 mmol/L (ref 134–144)

## 2018-09-09 ENCOUNTER — Encounter: Payer: Self-pay | Admitting: *Deleted

## 2022-02-09 ENCOUNTER — Inpatient Hospital Stay (HOSPITAL_COMMUNITY): Payer: Self-pay

## 2022-02-09 ENCOUNTER — Encounter (HOSPITAL_COMMUNITY): Payer: Self-pay | Admitting: Emergency Medicine

## 2022-02-09 ENCOUNTER — Other Ambulatory Visit: Payer: Self-pay

## 2022-02-09 ENCOUNTER — Emergency Department (HOSPITAL_COMMUNITY): Payer: Self-pay

## 2022-02-09 ENCOUNTER — Inpatient Hospital Stay (HOSPITAL_COMMUNITY)
Admission: EM | Admit: 2022-02-09 | Discharge: 2022-02-21 | DRG: 286 | Disposition: A | Discharge status: Home / Self Care | Payer: Self-pay | Attending: Internal Medicine | Admitting: Internal Medicine

## 2022-02-09 DIAGNOSIS — I340 Nonrheumatic mitral (valve) insufficiency: Secondary | ICD-10-CM

## 2022-02-09 DIAGNOSIS — K761 Chronic passive congestion of liver: Secondary | ICD-10-CM | POA: Diagnosis present

## 2022-02-09 DIAGNOSIS — I10 Essential (primary) hypertension: Secondary | ICD-10-CM

## 2022-02-09 DIAGNOSIS — R0600 Dyspnea, unspecified: Principal | ICD-10-CM

## 2022-02-09 DIAGNOSIS — Z6841 Body Mass Index (BMI) 40.0 and over, adult: Secondary | ICD-10-CM

## 2022-02-09 DIAGNOSIS — J189 Pneumonia, unspecified organism: Secondary | ICD-10-CM

## 2022-02-09 DIAGNOSIS — I5023 Acute on chronic systolic (congestive) heart failure: Secondary | ICD-10-CM

## 2022-02-09 DIAGNOSIS — Z8249 Family history of ischemic heart disease and other diseases of the circulatory system: Secondary | ICD-10-CM

## 2022-02-09 DIAGNOSIS — I472 Ventricular tachycardia, unspecified: Secondary | ICD-10-CM

## 2022-02-09 DIAGNOSIS — E1122 Type 2 diabetes mellitus with diabetic chronic kidney disease: Secondary | ICD-10-CM | POA: Diagnosis present

## 2022-02-09 DIAGNOSIS — Z79899 Other long term (current) drug therapy: Secondary | ICD-10-CM

## 2022-02-09 DIAGNOSIS — R6510 Systemic inflammatory response syndrome (SIRS) of non-infectious origin without acute organ dysfunction: Secondary | ICD-10-CM | POA: Diagnosis present

## 2022-02-09 DIAGNOSIS — I43 Cardiomyopathy in diseases classified elsewhere: Secondary | ICD-10-CM | POA: Diagnosis present

## 2022-02-09 DIAGNOSIS — E781 Pure hyperglyceridemia: Secondary | ICD-10-CM | POA: Diagnosis present

## 2022-02-09 DIAGNOSIS — Z91199 Patient's noncompliance with other medical treatment and regimen due to unspecified reason: Secondary | ICD-10-CM

## 2022-02-09 DIAGNOSIS — E1165 Type 2 diabetes mellitus with hyperglycemia: Secondary | ICD-10-CM | POA: Diagnosis present

## 2022-02-09 DIAGNOSIS — R590 Localized enlarged lymph nodes: Secondary | ICD-10-CM | POA: Diagnosis present

## 2022-02-09 DIAGNOSIS — Z20822 Contact with and (suspected) exposure to covid-19: Secondary | ICD-10-CM | POA: Diagnosis present

## 2022-02-09 DIAGNOSIS — I5021 Acute systolic (congestive) heart failure: Secondary | ICD-10-CM

## 2022-02-09 DIAGNOSIS — I110 Hypertensive heart disease with heart failure: Secondary | ICD-10-CM

## 2022-02-09 DIAGNOSIS — I428 Other cardiomyopathies: Secondary | ICD-10-CM | POA: Diagnosis present

## 2022-02-09 DIAGNOSIS — F1490 Cocaine use, unspecified, uncomplicated: Secondary | ICD-10-CM | POA: Diagnosis present

## 2022-02-09 DIAGNOSIS — N179 Acute kidney failure, unspecified: Secondary | ICD-10-CM | POA: Diagnosis present

## 2022-02-09 DIAGNOSIS — N1831 Chronic kidney disease, stage 3a: Secondary | ICD-10-CM | POA: Diagnosis present

## 2022-02-09 DIAGNOSIS — I5030 Unspecified diastolic (congestive) heart failure: Secondary | ICD-10-CM

## 2022-02-09 DIAGNOSIS — J9601 Acute respiratory failure with hypoxia: Secondary | ICD-10-CM | POA: Diagnosis present

## 2022-02-09 DIAGNOSIS — R0602 Shortness of breath: Secondary | ICD-10-CM

## 2022-02-09 DIAGNOSIS — I130 Hypertensive heart and chronic kidney disease with heart failure and stage 1 through stage 4 chronic kidney disease, or unspecified chronic kidney disease: Principal | ICD-10-CM | POA: Diagnosis present

## 2022-02-09 LAB — CBC WITH DIFFERENTIAL/PLATELET
Abs Immature Granulocytes: 0.11 10*3/uL — ABNORMAL HIGH (ref 0.00–0.07)
Basophils Absolute: 0.1 10*3/uL (ref 0.0–0.1)
Basophils Relative: 1 %
Eosinophils Absolute: 0.1 10*3/uL (ref 0.0–0.5)
Eosinophils Relative: 1 %
HCT: 40.9 % (ref 39.0–52.0)
Hemoglobin: 13.4 g/dL (ref 13.0–17.0)
Immature Granulocytes: 1 %
Lymphocytes Relative: 8 %
Lymphs Abs: 1.1 10*3/uL (ref 0.7–4.0)
MCH: 29.3 pg (ref 26.0–34.0)
MCHC: 32.8 g/dL (ref 30.0–36.0)
MCV: 89.5 fL (ref 80.0–100.0)
Monocytes Absolute: 0.9 10*3/uL (ref 0.1–1.0)
Monocytes Relative: 7 %
Neutro Abs: 11.3 10*3/uL — ABNORMAL HIGH (ref 1.7–7.7)
Neutrophils Relative %: 82 %
Platelets: 380 10*3/uL (ref 150–400)
RBC: 4.57 MIL/uL (ref 4.22–5.81)
RDW: 13.3 % (ref 11.5–15.5)
WBC: 13.6 10*3/uL — ABNORMAL HIGH (ref 4.0–10.5)
nRBC: 0.1 % (ref 0.0–0.2)

## 2022-02-09 LAB — I-STAT VENOUS BLOOD GAS, ED
Acid-Base Excess: 4 mmol/L — ABNORMAL HIGH (ref 0.0–2.0)
Bicarbonate: 27.6 mmol/L (ref 20.0–28.0)
Calcium, Ion: 1.15 mmol/L (ref 1.15–1.40)
HCT: 35 % — ABNORMAL LOW (ref 39.0–52.0)
Hemoglobin: 11.9 g/dL — ABNORMAL LOW (ref 13.0–17.0)
O2 Saturation: 97 %
Potassium: 4.1 mmol/L (ref 3.5–5.1)
Sodium: 135 mmol/L (ref 135–145)
TCO2: 29 mmol/L (ref 22–32)
pCO2, Ven: 37.9 mmHg — ABNORMAL LOW (ref 44–60)
pH, Ven: 7.47 — ABNORMAL HIGH (ref 7.25–7.43)
pO2, Ven: 90 mmHg — ABNORMAL HIGH (ref 32–45)

## 2022-02-09 LAB — APTT: aPTT: 31 seconds (ref 24–36)

## 2022-02-09 LAB — GAMMA GT: GGT: 152 U/L — ABNORMAL HIGH (ref 7–50)

## 2022-02-09 LAB — COMPREHENSIVE METABOLIC PANEL
ALT: 64 U/L — ABNORMAL HIGH (ref 0–44)
AST: 62 U/L — ABNORMAL HIGH (ref 15–41)
Albumin: 2.2 g/dL — ABNORMAL LOW (ref 3.5–5.0)
Alkaline Phosphatase: 140 U/L — ABNORMAL HIGH (ref 38–126)
Anion gap: 9 (ref 5–15)
BUN: 19 mg/dL (ref 6–20)
CO2: 25 mmol/L (ref 22–32)
Calcium: 8.8 mg/dL — ABNORMAL LOW (ref 8.9–10.3)
Chloride: 102 mmol/L (ref 98–111)
Creatinine, Ser: 1.27 mg/dL — ABNORMAL HIGH (ref 0.61–1.24)
GFR, Estimated: >60 mL/min (ref >60)
Glucose, Bld: 210 mg/dL — ABNORMAL HIGH (ref 70–99)
Potassium: 4.1 mmol/L (ref 3.5–5.1)
Sodium: 136 mmol/L (ref 135–145)
Total Bilirubin: 0.6 mg/dL (ref 0.3–1.2)
Total Protein: 6.6 g/dL (ref 6.5–8.1)

## 2022-02-09 LAB — RESPIRATORY PANEL BY PCR

## 2022-02-09 LAB — LACTIC ACID, PLASMA
Lactic Acid, Venous: 1.3 mmol/L (ref 0.5–1.9)
Lactic Acid, Venous: 1 mmol/L (ref 0.5–1.9)

## 2022-02-09 LAB — MRSA NEXT GEN BY PCR, NASAL: MRSA by PCR Next Gen: NOT DETECTED

## 2022-02-09 LAB — URINALYSIS, ROUTINE W REFLEX MICROSCOPIC
Bilirubin Urine: NEGATIVE
Glucose, UA: NEGATIVE mg/dL
Hgb urine dipstick: NEGATIVE
Ketones, ur: NEGATIVE mg/dL
Leukocytes,Ua: NEGATIVE
Nitrite: NEGATIVE
Protein, ur: NEGATIVE mg/dL
Specific Gravity, Urine: 1.009 (ref 1.005–1.030)
pH: 6 (ref 5.0–8.0)

## 2022-02-09 LAB — ECHOCARDIOGRAM COMPLETE BUBBLE STUDY
AR max vel: 4.09 cm2
AV Peak grad: 8 mmHg
Ao pk vel: 1.42 m/s
MV M vel: 4.99 m/s
MV Peak grad: 99.6 mmHg
S' Lateral: 5 cm
Single Plane A4C EF: 38.5 %

## 2022-02-09 LAB — RAPID URINE DRUG SCREEN, HOSP PERFORMED
Amphetamines: NOT DETECTED
Barbiturates: NOT DETECTED
Benzodiazepines: NOT DETECTED
Cocaine: NOT DETECTED
Opiates: NOT DETECTED
Tetrahydrocannabinol: NOT DETECTED

## 2022-02-09 LAB — RESP PANEL BY RT-PCR (FLU A&B, COVID) ARPGX2
Influenza A by PCR: NEGATIVE
Influenza B by PCR: NEGATIVE
SARS Coronavirus 2 by RT PCR: NEGATIVE

## 2022-02-09 LAB — STREP PNEUMONIAE URINARY ANTIGEN: Strep Pneumo Urinary Antigen: NEGATIVE

## 2022-02-09 LAB — GLUCOSE, CAPILLARY
Glucose-Capillary: 203 mg/dL — ABNORMAL HIGH (ref 70–99)
Glucose-Capillary: 171 mg/dL — ABNORMAL HIGH (ref 70–99)
Glucose-Capillary: 181 mg/dL — ABNORMAL HIGH (ref 70–99)
Glucose-Capillary: 166 mg/dL — ABNORMAL HIGH (ref 70–99)

## 2022-02-09 LAB — PROTIME-INR
INR: 1.2 (ref 0.8–1.2)
Prothrombin Time: 15.4 seconds — ABNORMAL HIGH (ref 11.4–15.2)

## 2022-02-09 LAB — PROCALCITONIN: Procalcitonin: 0.33 ng/mL

## 2022-02-09 LAB — BRAIN NATRIURETIC PEPTIDE: B Natriuretic Peptide: 717.4 pg/mL — ABNORMAL HIGH (ref 0.0–100.0)

## 2022-02-09 MED ORDER — IPRATROPIUM-ALBUTEROL 0.5-2.5 (3) MG/3ML IN SOLN
3.0000 mL | RESPIRATORY_TRACT | Status: DC | PRN
  Administered 2022-02-11: 3 mL via RESPIRATORY_TRACT
  Filled 2022-02-09 (×2): qty 3

## 2022-02-09 MED ORDER — HEPARIN SODIUM (PORCINE) 5000 UNIT/ML IJ SOLN
5000.0000 [IU] | Freq: Three times a day (TID) | INTRAMUSCULAR | Status: DC
  Administered 2022-02-09 – 2022-02-21 (×35): 5000 [IU] via SUBCUTANEOUS
  Filled 2022-02-09 (×34): qty 1

## 2022-02-09 MED ORDER — FUROSEMIDE 10 MG/ML IJ SOLN
40.0000 mg | Freq: Once | INTRAMUSCULAR | Status: AC
  Administered 2022-02-09: 40 mg via INTRAVENOUS
  Filled 2022-02-09: qty 4

## 2022-02-09 MED ORDER — IOHEXOL 300 MG/ML  SOLN
75.0000 mL | Freq: Once | INTRAMUSCULAR | Status: AC | PRN
  Administered 2022-02-09: 75 mL via INTRAVENOUS

## 2022-02-09 MED ORDER — VANCOMYCIN HCL 2000 MG/400ML IV SOLN
2000.0000 mg | Freq: Once | INTRAVENOUS | Status: AC
  Administered 2022-02-09: 2000 mg via INTRAVENOUS
  Filled 2022-02-09: qty 400

## 2022-02-09 MED ORDER — LACTATED RINGERS IV BOLUS: 500.0000 mL | Freq: Once | INTRAVENOUS | Status: DC

## 2022-02-09 MED ORDER — FUROSEMIDE 10 MG/ML IJ SOLN
INTRAMUSCULAR | Status: AC
  Filled 2022-02-09: qty 4

## 2022-02-09 MED ORDER — IPRATROPIUM-ALBUTEROL 0.5-2.5 (3) MG/3ML IN SOLN
3.0000 mL | Freq: Once | RESPIRATORY_TRACT | Status: AC
  Administered 2022-02-09: 3 mL via RESPIRATORY_TRACT
  Filled 2022-02-09: qty 3

## 2022-02-09 MED ORDER — METHYLPREDNISOLONE SODIUM SUCC 125 MG IJ SOLR
40.0000 mg | Freq: Every day | INTRAMUSCULAR | Status: AC
Start: 2022-02-09 — End: 2022-02-12
  Administered 2022-02-09 – 2022-02-12 (×4): 40 mg via INTRAVENOUS
  Filled 2022-02-09 (×4): qty 2

## 2022-02-09 MED ORDER — ACETAMINOPHEN 500 MG PO TABS
1000.0000 mg | ORAL_TABLET | Freq: Once | ORAL | Status: DC
  Filled 2022-02-09: qty 2

## 2022-02-09 MED ORDER — CHLORHEXIDINE GLUCONATE 0.12 % MT SOLN
15.0000 mL | Freq: Two times a day (BID) | OROMUCOSAL | Status: DC
  Administered 2022-02-10 – 2022-02-20 (×17): 15 mL via OROMUCOSAL
  Filled 2022-02-09 (×18): qty 15

## 2022-02-09 MED ORDER — VANCOMYCIN HCL 1500 MG/300ML IV SOLN
1500.0000 mg | Freq: Two times a day (BID) | INTRAVENOUS | Status: DC
  Filled 2022-02-09: qty 300

## 2022-02-09 MED ORDER — SODIUM CHLORIDE 0.9 % IV SOLN
2.0000 g | Freq: Once | INTRAVENOUS | Status: AC
  Administered 2022-02-09: 2 g via INTRAVENOUS
  Filled 2022-02-09: qty 12.5

## 2022-02-09 MED ORDER — HYDROCORTISONE SOD SUC (PF) 100 MG IJ SOLR
100.0000 mg | Freq: Two times a day (BID) | INTRAMUSCULAR | Status: DC
  Administered 2022-02-09: 100 mg via INTRAVENOUS
  Filled 2022-02-09: qty 2

## 2022-02-09 MED ORDER — ACETAMINOPHEN 325 MG PO TABS
650.0000 mg | ORAL_TABLET | Freq: Four times a day (QID) | ORAL | Status: DC | PRN
Start: 2022-02-09 — End: 2022-02-21
  Administered 2022-02-13: 650 mg via ORAL
  Filled 2022-02-09 (×2): qty 2

## 2022-02-09 MED ORDER — FUROSEMIDE 10 MG/ML IJ SOLN
40.0000 mg | Freq: Once | INTRAMUSCULAR | Status: AC
  Administered 2022-02-09: 40 mg via INTRAVENOUS

## 2022-02-09 MED ORDER — NYSTATIN 100000 UNIT/GM EX POWD
Freq: Two times a day (BID) | CUTANEOUS | Status: DC
  Filled 2022-02-09: qty 15

## 2022-02-09 MED ORDER — ORAL CARE MOUTH RINSE
15.0000 mL | Freq: Two times a day (BID) | OROMUCOSAL | Status: DC
  Administered 2022-02-12 – 2022-02-20 (×11): 15 mL via OROMUCOSAL

## 2022-02-09 MED ORDER — INSULIN ASPART 100 UNIT/ML IJ SOLN
0.0000 [IU] | INTRAMUSCULAR | Status: DC
  Administered 2022-02-09: 2 [IU] via SUBCUTANEOUS
  Administered 2022-02-09 (×2): 3 [IU] via SUBCUTANEOUS
  Administered 2022-02-09: 5 [IU] via SUBCUTANEOUS
  Administered 2022-02-09: 3 [IU] via SUBCUTANEOUS
  Administered 2022-02-10: 5 [IU] via SUBCUTANEOUS
  Administered 2022-02-10: 11 [IU] via SUBCUTANEOUS
  Administered 2022-02-10: 5 [IU] via SUBCUTANEOUS
  Administered 2022-02-10 (×2): 3 [IU] via SUBCUTANEOUS

## 2022-02-09 MED ORDER — SODIUM CHLORIDE 0.9 % IV SOLN
2.0000 g | Freq: Three times a day (TID) | INTRAVENOUS | Status: DC
  Administered 2022-02-09 – 2022-02-10 (×3): 2 g via INTRAVENOUS
  Filled 2022-02-09 (×3): qty 12.5

## 2022-02-09 MED ORDER — SODIUM CHLORIDE 0.9 % IV SOLN
500.0000 mg | INTRAVENOUS | Status: AC
  Administered 2022-02-09 – 2022-02-11 (×3): 500 mg via INTRAVENOUS
  Filled 2022-02-09 (×3): qty 5

## 2022-02-09 MED ORDER — CHLORHEXIDINE GLUCONATE CLOTH 2 % EX PADS
6.0000 | MEDICATED_PAD | Freq: Every day | CUTANEOUS | Status: DC
  Administered 2022-02-10 – 2022-02-16 (×7): 6 via TOPICAL

## 2022-02-09 NOTE — ED Notes (Signed)
CT tech states that pt unable to come to CT at this time due to pt needing to be decontaminated prior to coming to scanner. This RN explained that pt is on Bipap and will not be able to be taken to the shower due to clinical presentation.

## 2022-02-09 NOTE — Progress Notes (Addendum)
S: Patient lethargic but arouses to command. RR remains in 30-40s on bipap.  O:  Today's Vitals   02/09/22 0725 02/09/22 0815 02/09/22 0826 02/09/22 0830  BP:    132/83  Pulse: (!) 104 (!) 102 99 99  Resp: (!) 35 (!) 34 (!) 41 (!) 35  Temp:   98 F (36.7 C)   TempSrc:   Oral   SpO2: 93% 97% 96% 94%  Weight:      Height:      PainSc:       Body mass index is 47.44 kg/m.  General:  critically ill appearing on bipap HEENT: MM pink/moist; bipap in place Neuro: lethargic; arouses to command CV: s1s2, no m/r/g PULM:  dim clear BS bilaterally; bipap 12/5 70%; RR 30-40s; some accessory muscle use GI: soft, bsx4 active  Extremities: warm/dry, BLE edema  Skin: no rashes or lesions appreciated  AP: Acute hypoxemic resp failure Recent CAP H/o LVH w/ suspected diastolic dysfunction with acute decompensation CKD2 P: -repeat dose of lasix this am; monitor UOP -echo scheduled for today -will increase bipap settings to 15/8; wean fio2 for sats >92% -if resp status does not improve may need to consider intubation -continue abx; f/u cultures; unable to send exporated sputum at this time; check PCT   JD Daryel [facility] 02/09/2022, 9:24 AM  Please see [email or web address] for pager details.  From 7A-7P if no response, please call [phone]. After hours, please call ELink [phone].

## 2022-02-09 NOTE — ED Notes (Signed)
Pt to be taken to CT prior to going to floor

## 2022-02-09 NOTE — ED Triage Notes (Signed)
Patient comes in with shortness of breath, found at 70% oxygen on RA.  Audible wheezing.  Cough, obvious shortness of breath, recent diagnosis of PNA.  Patient has Bed bugs.

## 2022-02-09 NOTE — Progress Notes (Signed)
Pt was transported to Ct scan and to 2M03 without complications.

## 2022-02-09 NOTE — Progress Notes (Signed)
Pharmacy Antibiotic Note  Justin [name] is a 41 y.o. male admitted on 02/09/2022 with respiratory failure, possible PNA.  Pharmacy has been consulted for Vancomycin and Cefepime dosing.  Vancomycin 2 g IV given in ED at  0530  Plan: Vancomycin 1500 mg IV q12h Cefepime 2 g IV q8h  Height: 5\' 5"  (165.1 cm) Weight: 129.3 kg (285 lb 0.9 oz) IBW/kg (Calculated) : 61.5  Temp (24hrs), Avg:100.7 F (38.2 C), Min:100.7 F (38.2 C), Max:100.7 F (38.2 C)  Recent Labs  Lab 02/09/22 0415 02/09/22 0536  WBC 13.6*  --   CREATININE 1.27*  --   LATICACIDVEN 1.3 1.0    Estimated Creatinine Clearance: 95 mL/min (A) (by C-G formula based on SCr of 1.27 mg/dL (H)).    No Known Allergies   04/11/22 02/09/2022 6:50 AM

## 2022-02-09 NOTE — ED Provider Triage Note (Signed)
  Emergency Medicine Provider Triage Evaluation Note  MRN:  [id number]  Arrival date & time: 02/09/22    Medically screening exam initiated at 4:01 AM.   CC:   Shortness of Breath   HPI:  Kaushik Maul is a 41 y.o. year-old male presents to the ED with chief complaint of SOB.  Reported recent pneumonia diagnosis.  Febrile to 100.7 in triage.  History provided by History provided by patient. ROS:  -As included in HPI PE:  There were no vitals filed for this visit.  Ill appearing mild respiratory distress  MDM:  Based on signs and symptoms, sepsis 2/2 pneumonia is highest on my differential, followed by covid, flu. I've ordered labs and imaging in triage to expedite lab/diagnostic workup.  Patient was informed that the remainder of the evaluation will be completed by another provider, this initial triage assessment does not replace that evaluation, and the importance of remaining in the ED until their evaluation is complete.    Roxy Horseman, PA-C 02/09/22 0403

## 2022-02-09 NOTE — ED Provider Notes (Signed)
[facility] EMERGENCY DEPARTMENT Provider Note  CSN: [id number] Arrival date & time: 02/09/22 0349  Chief Complaint(s) Shortness of Breath  HPI Justin [name] is a 41 y.o. male with PMH CHF, nonischemic cardiomyopathy with echo 3 years ago showing an EF 55% with severe LVH who presents emergency department for evaluation of shortness of breath.  Patient found 70% on room air with audible wheezing, accessory muscle use and tachypnea.  Reports he has been diagnosed with pneumonia before, and arrives with noticeable bedbugs.  Patient states that he is sleeping in bed with somebody who  likely has the bedbugs and he is currently not sleeping on the street.  Patient arrives hypoxic, febrile, tachypneic and tachycardic.  Additional history unable to be obtained as patient is in severe respiratory distress.  Patient arrived to his room on a nonrebreather.   Past Medical History Past Medical History:  Diagnosis Date   Heart failure with reduced ejection fraction [facility]) 2017   [facility]   Hypertension 2017   [facility]   Patient Active Problem List   Diagnosis Date Noted   Hypertriglyceridemia 04/18/2018   Nonischemic cardiomyopathy ([place]) 03/29/2018   Chronic diastolic heart failure ([place]) 03/29/2018   Essential hypertension 03/29/2018   Home Medication(s) Prior to Admission medications   Medication Sig Start Date End Date Taking? Authorizing Provider  carvedilol (COREG) 25 MG tablet Take 1 tablet (25 mg total) by mouth 2 (two) times daily with a meal. 05/30/18   Buford Dresser, MD  fenofibrate 54 MG tablet Take 1 tablet (54 mg total) by mouth daily. 05/30/18   Buford Dresser, MD  furosemide (LASIX) 40 MG tablet Take 1 tablet (40 mg total) by mouth daily. 05/30/18   Buford Dresser, MD  lisinopril (PRINIVIL,ZESTRIL) 20 MG tablet Take 1 tablet (20 mg total) by mouth 2 (two) times daily. 05/30/18   Buford Dresser, MD   spironolactone (ALDACTONE) 25 MG tablet Take 2 tablets (50 mg total) by mouth daily. 05/30/18   Buford Dresser, MD                                                                                                                                    Past Surgical History History reviewed. No pertinent surgical history. Family History Family History  Problem Relation Age of Onset   Hypertension Father     Social History Social History   Tobacco Use   Smoking status: Never   Smokeless tobacco: Never   Allergies Patient has no known allergies.  Review of Systems Review of Systems  Constitutional:  Positive for fever.  Respiratory:  Positive for cough, shortness of breath and wheezing.   Cardiovascular:  Positive for leg swelling.   Physical Exam Vital Signs  I have reviewed the triage vital signs BP (!) 146/94   Pulse (!) 115   Temp (!) 100.7 F (38.2 C) (Oral)   Resp (!) 38  SpO2 98%   Physical Exam Constitutional:      General: He is not in acute distress.    Appearance: Normal appearance. He is ill-appearing.  HENT:     Head: Normocephalic and atraumatic.     Nose: No congestion or rhinorrhea.  Eyes:     General:        Right eye: No discharge.        Left eye: No discharge.     Extraocular Movements: Extraocular movements intact.     Pupils: Pupils are equal, round, and reactive to light.  Cardiovascular:     Rate and Rhythm: Normal rate and regular rhythm.     Heart sounds: No murmur heard. Pulmonary:     Effort: Tachypnea, accessory muscle usage and respiratory distress present.     Breath sounds: Wheezing and rales present.  Abdominal:     General: There is no distension.     Tenderness: There is no abdominal tenderness.  Musculoskeletal:        General: Normal range of motion.     Cervical back: Normal range of motion.     Right lower leg: Edema present.     Left lower leg: Edema present.  Skin:    General: Skin is warm and dry.   Neurological:     General: No focal deficit present.     Mental Status: He is alert.    ED Results and Treatments Labs (all labs ordered are listed, but only abnormal results are displayed) Labs Reviewed  COMPREHENSIVE METABOLIC PANEL - Abnormal; Notable for the following components:      Result Value   Glucose, Bld 210 (*)    Creatinine, Ser 1.27 (*)    Calcium 8.8 (*)    Albumin 2.2 (*)    AST 62 (*)    ALT 64 (*)    Alkaline Phosphatase 140 (*)    All other components within normal limits  CBC WITH DIFFERENTIAL/PLATELET - Abnormal; Notable for the following components:   WBC 13.6 (*)    Neutro Abs 11.3 (*)    Abs Immature Granulocytes 0.11 (*)    All other components within normal limits  PROTIME-INR - Abnormal; Notable for the following components:   Prothrombin Time 15.4 (*)    All other components within normal limits  CULTURE, BLOOD (ROUTINE X 2)  CULTURE, BLOOD (ROUTINE X 2)  LACTIC ACID, PLASMA  APTT  LACTIC ACID, PLASMA  URINALYSIS, ROUTINE W REFLEX MICROSCOPIC  BRAIN NATRIURETIC PEPTIDE                                                                                                                          Radiology No results found.  Pertinent labs & imaging results that were available during my care of the patient were reviewed by me and considered in my medical decision making (see MDM for details).  Medications Ordered in ED Medications  lactated ringers bolus 500 mL (has no administration in time  range)  vancomycin (VANCOREADY) IVPB 2000 mg/400 mL (has no administration in time range)  ceFEPIme (MAXIPIME) 2 g in sodium chloride 0.9 % 100 mL IVPB (has no administration in time range)  acetaminophen (TYLENOL) tablet 1,000 mg (has no administration in time range)  ipratropium-albuterol (DUONEB) 0.5-2.5 (3) MG/3ML nebulizer solution 3 mL (3 mLs Nebulization Given 02/09/22 0434)                                                                                                                                      Procedures .Critical Care Performed by: Teressa Lower, MD Authorized by: Teressa Lower, MD   Critical care provider statement:    Critical care time (minutes):  30   Critical care was necessary to treat or prevent imminent or life-threatening deterioration of the following conditions:  Sepsis and respiratory failure   Critical care was time spent personally by me on the following activities:  Development of treatment plan with patient or surrogate, discussions with consultants, evaluation of patient's response to treatment, examination of patient, ordering and review of laboratory studies, ordering and review of radiographic studies, ordering and performing treatments and interventions, pulse oximetry, re-evaluation of patient's condition and review of old charts  (including critical care time)  Medical Decision Making / ED Course   This patient presents to the ED for concern of fever, shortness of breath, cough, this involves an extensive number of treatment options, and is a complaint that carries with it a high risk of complications and morbidity.  The differential diagnosis includes pneumonia, sepsis, CHF exacerbation, ARDS  MDM: Patient seen emergency room for evaluation of cough, fever and shortness of breath.  Physical exam reveals a ill-appearing patient with expiratory wheezing, rales at bilateral bases, lower extremity edema and a regular tachycardia.  Patient meets sepsis criteria on arrival and broad-spectrum antibiotics begun with bank and cefepime.  Laboratory evaluation with a pH of 7.47, no significant hypercarbia, leukocytosis to 13.6, creatinine 1.27, AST 62, ALT 64, alk phos 140, BNP elevated to 717.4 and with a normal lactate and a chest x-ray showing severe CHF, we did not pursue aggressive fluid resuscitation despite patient meeting sepsis criteria.  Lasix was administered for diuresis and we attempted to wean the patient  down to nasal cannula but patient remains at best 85% on 6 L.  Thus we transition the patient to BiPAP and he has maintained his oxygen saturations on the BiPAP.  Patient require ICU admission and patient admitted.   Additional history obtained:  -External records from outside source obtained and reviewed including: Chart review including previous notes, labs, imaging, consultation notes   Lab Tests: -I ordered, reviewed, and interpreted labs.   The pertinent results include:   Labs Reviewed  COMPREHENSIVE METABOLIC PANEL - Abnormal; Notable for the following components:      Result Value   Glucose, Bld 210 (*)    Creatinine, Ser 1.27 (*)  Calcium 8.8 (*)    Albumin 2.2 (*)    AST 62 (*)    ALT 64 (*)    Alkaline Phosphatase 140 (*)    All other components within normal limits  CBC WITH DIFFERENTIAL/PLATELET - Abnormal; Notable for the following components:   WBC 13.6 (*)    Neutro Abs 11.3 (*)    Abs Immature Granulocytes 0.11 (*)    All other components within normal limits  PROTIME-INR - Abnormal; Notable for the following components:   Prothrombin Time 15.4 (*)    All other components within normal limits  CULTURE, BLOOD (ROUTINE X 2)  CULTURE, BLOOD (ROUTINE X 2)  LACTIC ACID, PLASMA  APTT  LACTIC ACID, PLASMA  URINALYSIS, ROUTINE W REFLEX MICROSCOPIC  BRAIN NATRIURETIC PEPTIDE      EKG   EKG Interpretation  Date/Time:  Thursday February 09 2022 04:06:57 EDT Ventricular Rate:  126 PR Interval:  138 QRS Duration: 96 QT Interval:  326 QTC Calculation: 472 R Axis:   70 Text Interpretation: Sinus tachycardia Incomplete right bundle branch block Abnormal ECG No previous ECGs available Confirmed by [place] ([id number]) on 02/09/2022 4:49:47 AM         Imaging Studies ordered: I ordered imaging studies including chest x-ray I independently visualized and interpreted imaging. I agree with the radiologist interpretation   Medicines ordered and prescription drug  management: Meds ordered this encounter  Medications   ipratropium-albuterol (DUONEB) 0.5-2.5 (3) MG/3ML nebulizer solution 3 mL   lactated ringers bolus 500 mL   vancomycin (VANCOREADY) IVPB 2000 mg/400 mL    Order Specific Question:   Indication:    Answer:   Sepsis   ceFEPIme (MAXIPIME) 2 g in sodium chloride 0.9 % 100 mL IVPB    Order Specific Question:   Antibiotic Indication:    Answer:   Sepsis   acetaminophen (TYLENOL) tablet 1,000 mg    -I have reviewed the patients home medicines and have made adjustments as needed  Critical interventions BiPAP, diuresis, multiple antibiotics  Consultations Obtained: I requested consultation with the intensivist,  and discussed lab and imaging findings as well as pertinent plan - they recommend: ICU admission   Cardiac Monitoring: The patient was maintained on a cardiac monitor.  I personally viewed and interpreted the cardiac monitored which showed an underlying rhythm of: Sinus tachycardia  Social Determinants of Health:  Factors impacting patients care include: Spanish-speaking   Reevaluation: After the interventions noted above, I reevaluated the patient and found that they have :improved  Co morbidities that complicate the patient evaluation  Past Medical History:  Diagnosis Date   Heart failure with reduced ejection fraction [facility]) 2017   [facility]   Hypertension 2017   [place]: I considered admission for this patient, due to severe respiratory distress requiring BiPAP patient will require admission     Final Clinical Impression(s) / ED Diagnoses Final diagnoses:  None     @PCDICTATION @    Teressa Lower, MD 02/09/22 [phone]

## 2022-02-09 NOTE — ED Notes (Signed)
Pts clothes removed, CHG bath given and gown placed on pt

## 2022-02-09 NOTE — ED Notes (Signed)
Justin [name] [id number] call when done

## 2022-02-09 NOTE — H&P (Addendum)
NAME:  Justin [name], MRN:  [id number], DOB:  [birth year], LOS: 0 ADMISSION DATE:  02/09/2022, CONSULTATION DATE:  02/09/22 REFERRING MD:  EDP, CHIEF COMPLAINT:  sob   History of Present Illness:  41 yo spanish speaking male with pmh LVH, HTN, hypertriglyceridemia presented after feeling sob. Oxygen sats found to be 70% with pulmonary edema on cxr. Pt did endorse a recent dx of pna, although unclear if he completed a course of abx.  History is limited as pt is spanish speaking only and translator unable to interpret while NIV in place. Chart review revealed le swelling x2-3 days but otherwise limited as to acuity of sob or any other associated symptoms. Hopefully has his resp status improves we will be able to collect more history.   At this time he is arousable  and follows simple commands. His wob is reportedly greatly improved on NIV per staff. Sats in mid 90's. He does deny any pain, + cough and nods when asked if breathing is easier.   Pertinent  Medical History  LVH HTN Hypertriglyceridemia Recent pna  Significant Hospital Events: Including procedures, antibiotic start and stop dates in addition to other pertinent events   6/1: admitted to [facility] 2/2 pulmonary edema and acute resp failure  Interim History / Subjective:  As above  Objective   Blood pressure (!) 131/96, pulse (!) 111, temperature (!) 100.7 F (38.2 C), temperature source Oral, resp. rate (!) 37, height $RemoveBe'5\' 5"'DexEJdIDL$  (1.651 m), weight 129.3 kg, SpO2 96 %.    FiO2 (%):  [70 %] 70 %  No intake or output data in the 24 hours ending 02/09/22 0553 Filed Weights   02/09/22 0529  Weight: 129.3 kg    Examination: General: drowsy obese male reclining in bed with NIV in place. No accessory muscle use but some tachypnea HENT: NCAT EOMI, PERRLA, mm unable to be assessed 2/2 NIV Lungs: bibasilar rales and + wheeze Cardiovascular: tachycardic but otherwise reg rhythm  Abdomen: obese, nt, nd, bowel sounds distant Extremities:  3-4+ LE edema to mid thigh, no clubbing, cyanosis, noted skin lesions c/w bedbugs Neuro: follows simple commands in all extremities, unable to assess orientation otherwise with NIV and language barrier GU: deferred  Resolved Hospital Problem list     Assessment & Plan:  Acute hypoxemic resp failure:  -2/2 pulmonary edema  -on NIV 12/5 and 70% -hopefully will turn the corner and not require intubation.  -titrate NIV at this time.  -schedule duonebs in light of wheezing on exam although likely 2/2 fluid. -check rvp and covid testing for completeness sake -ct chest pending as well.  Recent CAP:  -recent dx of pna  -cxr more c/w pulmonary edema but superimposed pna very much a possibility in light of leukocytosis and fever -broaden abx for now -wbc 13K -f/u blood cx  Transaminitis:  -noted -can trend tomorrow as well  -check RUQ u/s as well.  -bili normal Elevated alk phos:  -check ggt  H/o LVH with suspected diastolic dysfunction with acute decompensation:  -cont NIV -repeat echo 2019 showed LVH with EF 55-60% -diurese, 40 lasix ordered for now and will reassess in 4-6 hours.  -bnp is elevated at 717 -unclear if pt has been adherent to home med use H/o htn:  -on multiple home medications  Ckd2:  -noted -cr actually lower than previous in our system -will monitor as we work to [employer] bugs:  -noted -supportive care to wounds  Hyperglycemia:  -check a1c -ssi  Best Practice (right click and "Reselect all SmartList Selections" daily)   Diet/type: NPO DVT prophylaxis: LMWH GI prophylaxis: N/A Lines: N/A Foley:  N/A Code Status:  full code Last date of multidisciplinary goals of care discussion [pending family ]  Labs   CBC: Recent Labs  Lab 02/09/22 0415 02/09/22 0542  WBC 13.6*  --   NEUTROABS 11.3*  --   HGB 13.4 11.9*  HCT 40.9 35.0*  MCV 89.5  --   PLT 380  --     Basic Metabolic Panel: Recent Labs  Lab 02/09/22 0415 02/09/22 0542   NA 136 135  K 4.1 4.1  CL 102  --   CO2 25  --   GLUCOSE 210*  --   BUN 19  --   CREATININE 1.27*  --   CALCIUM 8.8*  --    GFR: Estimated Creatinine Clearance: 95 mL/min (A) (by C-G formula based on SCr of 1.27 mg/dL (H)). Recent Labs  Lab 02/09/22 0415  WBC 13.6*  LATICACIDVEN 1.3    Liver Function Tests: Recent Labs  Lab 02/09/22 0415  AST 62*  ALT 64*  ALKPHOS 140*  BILITOT 0.6  PROT 6.6  ALBUMIN 2.2*   No results for input(s): LIPASE, AMYLASE in the last 168 hours. No results for input(s): AMMONIA in the last 168 hours.  ABG    Component Value Date/Time   HCO3 27.6 02/09/2022 0542   TCO2 29 02/09/2022 0542   O2SAT 97 02/09/2022 0542     Coagulation Profile: Recent Labs  Lab 02/09/22 0415  INR 1.2    Cardiac Enzymes: No results for input(s): CKTOTAL, CKMB, CKMBINDEX, TROPONINI in the last 168 hours.  HbA1C: Hgb A1c MFr Bld  Date/Time Value Ref Range Status  03/29/2018 03:28 PM 6.2 (H) 4.8 - 5.6 % Final    Comment:             Prediabetes: 5.7 - 6.4          Diabetes: >6.4          Glycemic control for adults with diabetes: <7.0     CBG: No results for input(s): GLUCAP in the last 168 hours.  Review of Systems:   As per HPI  Past Medical History:  He,  has a past medical history of Heart failure with reduced ejection fraction ([place]) (2017) and Hypertension (2017).   Surgical History:  History reviewed. No pertinent surgical history.   Social History:   reports that he has never smoked. He has never used smokeless tobacco.   Family History:  His family history includes Hypertension in his father.   Allergies No Known Allergies   Home Medications  Prior to Admission medications   Medication Sig Start Date End Date Taking? Authorizing Provider  carvedilol (COREG) 25 MG tablet Take 1 tablet (25 mg total) by mouth 2 (two) times daily with a meal. 05/30/18   Buford Dresser, MD  fenofibrate 54 MG tablet Take 1 tablet (54 mg  total) by mouth daily. 05/30/18   Buford Dresser, MD  furosemide (LASIX) 40 MG tablet Take 1 tablet (40 mg total) by mouth daily. 05/30/18   Buford Dresser, MD  lisinopril (PRINIVIL,ZESTRIL) 20 MG tablet Take 1 tablet (20 mg total) by mouth 2 (two) times daily. 05/30/18   Buford Dresser, MD  spironolactone (ALDACTONE) 25 MG tablet Take 2 tablets (50 mg total) by mouth daily. 05/30/18   Buford Dresser, MD     Critical care time: 42 min excluding procedures between  0500-0700        

## 2022-02-09 NOTE — TOC Initial Note (Signed)
Transition of Care [facility]) - Initial/Assessment Note    Patient Details  Name: Justin [name] MRN: [id number] Date of Birth: Jan 05, 1981  Transition of Care [facility]) CM/SW Contact:    Angelita Ingles, RN Phone [phone]  02/09/2022, 4:13 PM  Clinical Narrative:                 41 yo spanish speaking male with pmh LVH, HTN, hypertriglyceridemia presented after feeling sob. Bipap IV antibiotics and high risk for intubation. TOC will continue to follow Transition of Care [facility]) Screening Note   Patient Details  Name: Justin [name] Date of Birth: 1981/02/27   Transition of Care [facility]) CM/SW Contact:    Angelita Ingles, RN Phone Number: 02/09/2022, 4:14 PM    Transition of Care Department [facility]) has reviewed patient and no TOC needs have been identified at this time. We will continue to monitor patient advancement through interdisciplinary progression rounds.           Patient Goals and CMS Choice        Expected Discharge Plan and Services                                                Prior Living Arrangements/Services                       Activities of Daily Living      Permission Sought/Granted                  Emotional Assessment              Admission diagnosis:  Acute hypoxemic respiratory failure ([place]) [J96.01] Dyspnea, unspecified type [R06.00] Patient Active Problem List   Diagnosis Date Noted   Acute hypoxemic respiratory failure ([place]) 02/09/2022   Hypertriglyceridemia 04/18/2018   Nonischemic cardiomyopathy ([place]) 03/29/2018   Chronic diastolic heart failure ([place]) 03/29/2018   Essential hypertension 03/29/2018   PCP:  Patient, No Pcp Per (Inactive) Pharmacy:   [place], [place] - [address] [address] Phone: [phone] Fax: [phone]     Social Determinants of Health (SDOH) Interventions    Readmission Risk  Interventions     View : No data to display.

## 2022-02-09 NOTE — ED Notes (Signed)
Pt transported to CT by SWOT RN and RT

## 2022-02-09 NOTE — ED Notes (Signed)
CT called to get pt for scan, stated there isn't an available bed at this time but pt is next to come to CT

## 2022-02-10 ENCOUNTER — Inpatient Hospital Stay (HOSPITAL_COMMUNITY): Payer: Self-pay

## 2022-02-10 LAB — CBC
HCT: 40.8 % (ref 39.0–52.0)
Hemoglobin: 13.3 g/dL (ref 13.0–17.0)
MCH: 29.3 pg (ref 26.0–34.0)
MCHC: 32.6 g/dL (ref 30.0–36.0)
MCV: 89.9 fL (ref 80.0–100.0)
Platelets: 353 10*3/uL (ref 150–400)
RBC: 4.54 MIL/uL (ref 4.22–5.81)
RDW: 13.5 % (ref 11.5–15.5)
WBC: 10.3 10*3/uL (ref 4.0–10.5)
nRBC: 0 % (ref 0.0–0.2)

## 2022-02-10 LAB — COMPREHENSIVE METABOLIC PANEL
ALT: 49 U/L — ABNORMAL HIGH (ref 0–44)
AST: 34 U/L (ref 15–41)
Albumin: 2 g/dL — ABNORMAL LOW (ref 3.5–5.0)
Alkaline Phosphatase: 126 U/L (ref 38–126)
Anion gap: 9 (ref 5–15)
BUN: 26 mg/dL — ABNORMAL HIGH (ref 6–20)
CO2: 26 mmol/L (ref 22–32)
Calcium: 8.6 mg/dL — ABNORMAL LOW (ref 8.9–10.3)
Chloride: 103 mmol/L (ref 98–111)
Creatinine, Ser: 1.19 mg/dL (ref 0.61–1.24)
GFR, Estimated: >60 mL/min (ref >60)
Glucose, Bld: 165 mg/dL — ABNORMAL HIGH (ref 70–99)
Potassium: 4.4 mmol/L (ref 3.5–5.1)
Sodium: 138 mmol/L (ref 135–145)
Total Bilirubin: 0.8 mg/dL (ref 0.3–1.2)
Total Protein: 6.4 g/dL — ABNORMAL LOW (ref 6.5–8.1)

## 2022-02-10 LAB — GLUCOSE, CAPILLARY
Glucose-Capillary: 149 mg/dL — ABNORMAL HIGH (ref 70–99)
Glucose-Capillary: 161 mg/dL — ABNORMAL HIGH (ref 70–99)
Glucose-Capillary: 189 mg/dL — ABNORMAL HIGH (ref 70–99)
Glucose-Capillary: 201 mg/dL — ABNORMAL HIGH (ref 70–99)
Glucose-Capillary: 216 mg/dL — ABNORMAL HIGH (ref 70–99)
Glucose-Capillary: 332 mg/dL — ABNORMAL HIGH (ref 70–99)

## 2022-02-10 LAB — HEMOGLOBIN A1C
Hgb A1c MFr Bld: 9.9 % — ABNORMAL HIGH (ref 4.8–5.6)
Mean Plasma Glucose: 237.43 mg/dL

## 2022-02-10 LAB — MAGNESIUM: Magnesium: 2.2 mg/dL (ref 1.7–2.4)

## 2022-02-10 LAB — PHOSPHORUS: Phosphorus: 4.1 mg/dL (ref 2.5–4.6)

## 2022-02-10 MED ORDER — CARVEDILOL 12.5 MG PO TABS
12.5000 mg | ORAL_TABLET | Freq: Two times a day (BID) | ORAL | Status: DC
  Administered 2022-02-10 – 2022-02-15 (×10): 12.5 mg via ORAL
  Filled 2022-02-10 (×10): qty 1

## 2022-02-10 MED ORDER — SODIUM CHLORIDE 0.9 % IV SOLN
1.0000 g | INTRAVENOUS | Status: DC
  Administered 2022-02-10 – 2022-02-11 (×2): 1 g via INTRAVENOUS
  Filled 2022-02-10 (×2): qty 10

## 2022-02-10 MED ORDER — CARVEDILOL 25 MG PO TABS
25.0000 mg | ORAL_TABLET | Freq: Two times a day (BID) | ORAL | Status: DC
  Administered 2022-02-10: 25 mg via ORAL
  Filled 2022-02-10: qty 1

## 2022-02-10 MED ORDER — INSULIN ASPART 100 UNIT/ML IJ SOLN
0.0000 [IU] | Freq: Three times a day (TID) | INTRAMUSCULAR | Status: DC
  Administered 2022-02-11: 5 [IU] via SUBCUTANEOUS
  Administered 2022-02-11 (×2): 15 [IU] via SUBCUTANEOUS
  Administered 2022-02-11: 3 [IU] via SUBCUTANEOUS
  Administered 2022-02-12: 5 [IU] via SUBCUTANEOUS

## 2022-02-10 MED ORDER — FUROSEMIDE 10 MG/ML IJ SOLN
40.0000 mg | Freq: Once | INTRAMUSCULAR | Status: AC
  Administered 2022-02-10: 40 mg via INTRAVENOUS
  Filled 2022-02-10: qty 4

## 2022-02-10 NOTE — Progress Notes (Signed)
[facility] Progress Note Patient Name: Justin [name] DOB: [birth year] MRN: [id number]   Date of Service  02/10/2022  HPI/Events of Note  Patient on diet  eICU Interventions  Changed CBG checks and insulin coverage to [facility] and HS     Intervention Category Minor Interventions: Routine modifications to care plan (e.g. PRN medications for pain, fever)  Justin [name] 02/10/2022, 8:38 PM

## 2022-02-10 NOTE — Progress Notes (Addendum)
NAME:  Justin [name], MRN:  [id number], DOB:  Oct 12, 1980, LOS: 1 ADMISSION DATE:  02/09/2022, CONSULTATION DATE:  02/09/2022 REFERRING MD:  EDP, CHIEF COMPLAINT:  SOB   History of Present Illness:  41 year old man (Spanish-speaking) who presented to [facility] 6/1 for SOB. PMHx significant for HTN, LVH, hypertriglyceridemia. Oxygen sats found to be 70% with pulmonary edema on CXR. Recent diagnosis of PNA, although unclear if he completed a course of abx.   Initial history limited as patient is Spanish-speaking only and translator unable to interpret while NIV in place. Chart review revealed LE swelling x 2-3 days but otherwise limited as to acuity of SOB or any other associated symptoms. Prior history of THC/cocaine use.  PCCM consulted for ICU admission.  Pertinent Medical History:  LVH HTN Hypertriglyceridemia Recent PNA  Significant Hospital Events: Including procedures, antibiotic start and stop dates in addition to other pertinent events   6/1: admitted to [facility] 2/2 pulmonary edema and acute resp failure 6/2 Improved respiratory status, on Salter 12L, BiPAP overnight. O2 sats improved. Continues with CAP coverage, steroids. Net -1.8L/24H, continuing Lasix.  Interim History / Subjective:  Looking/feeling better this morning Able to transition off of BiPAP to Salter 12L, tolerating well O2 sats remain 92-95% Abx narrowed from cefepime/azithro to ceftriaxone/azithro Steroid stop date set for 6/4 Coreg resumed for hypertension, tachycardia  Objective:  Blood pressure (!) 150/86, pulse 94, temperature (!) 97.5 F (36.4 C), temperature source Axillary, resp. rate (!) 32, height 5' 5"  (1.651 m), weight 129.3 kg, SpO2 91 %.    FiO2 (%):  [50 %-70 %] 50 %   Intake/Output Summary (Last 24 hours) at 02/10/2022 0721 Last data filed at 02/10/2022 0600 Gross per 24 hour  Intake 953.3 ml  Output 3775 ml  Net -2821.7 ml   Filed Weights   02/09/22 0529  Weight: 129.3 kg   Physical  Examination: General: Acutely ill-appearing middle-aged man in NAD. Sitting up in bed. HEENT: [place]/AT, anicteric sclera, PERRL, moist mucous membranes. Neuro: Awake, oriented x 4, assess with assistance of RN. Responds to verbal stimuli. Following commands consistently. Moves all 4 extremities spontaneously. CV: Tachycardic, regular rhythm, no m/g/r. PULM: Breathing even and unlabored on Salter 12L. Lung fields with crackles bilaterally, scattered rhonchi, no wheezing. GI: Soft, nontender, nondistended. Normoactive bowel sounds. Extremities: Bilateral symmetric 1+ pitting LE edema noted. Skin: Warm/dry, no significant rashes.  Resolved Hospital Problem List:     Assessment & Plan:  Acute hypoxemic respiratory failure Likely 2/2 pulmonary edema, multifocal PNA. CT Chest 6/1 with extensive patchy alveolar infiltrates bilaterally, suggesting possible multifocal pneumonia; no cavitary lesions; enlarged lymph nodes in the mediastinum, likely reactive. - Continue BiPAP PRN, titrate off as able - Continue supplemental O2 support - Wean FiO2 for O2 sat > 90% - Solumedrol x 4 days (end 6/4) - DuoNebs - Pulmonary hygiene - Diuresis as tolerated; repeat Lasix 6m x 1  Recent CAP Recently diagnosed with PNA; CXR on admission more c/w pulmonary edema but superimposed PNA very much a possibility in light of leukocytosis and fever. PCT 0.33. - Trend WBC, fever curve - F/u Cx data - Narrow cefepime to ceftriaxone/azithromycin for usual CAP coverage - F/u urine strep, legionella  Transaminitis, improving Elevated alk phos, improving UKoreaAbdomen RUQ 6/1 negative for abnormalities. GGT 152. - Trend LFTs to normal, significantly improved 6/2   HFrEF History of LVH Echo 6/1 with EF 30-35%, global hypokinesis; previous Echo 2019 EF 55-50%. CXR appears wet. BNP elevated 717. - Continue NIPPV/BiPAP  as tolerated - Diuresis as tolerated, additional Lasix 37m today 6/2  History of Hypertension On  multiple home medications. - Resume Coreg 6/2, monitor HR - Hold resumption of Lisinopril for now - Diuretics as above  CKD stage 2 Baseline Cr 1.2-1.4. Cr on admission 1.27, improving. - Trend BMP - Replete electrolytes as indicated - Monitor I&Os - Avoid nephrotoxic agents as able - Ensure adequate renal perfusion  Bed bugs  Noted. - Supportive care for lesions  Hyperglycemia A1c 9.9. - SSI - CBGs Q4H while NPO, then  AC/HS once tolerating PO - Goal 140s-180s - May require diabetes education  Best Practice (right click and "Reselect all SmartList Selections" daily)   Diet/type: NPO, ADAT if remaining off of BiPAP DVT prophylaxis: LMWH GI prophylaxis: N/A Lines: N/A Foley:  N/A Code Status:  full code Last date of multidisciplinary goals of care discussion [Briefly discussed at bedside 5/31, remains full code]  Critical care time: 37 minutes   SLestine Mount PA-C [facility] 02/10/22 7:22 AM  Please see [email or web address] for pager details.  From 7A-7P if no response, please call [phone] After hours, please call ELink [phone]

## 2022-02-11 DIAGNOSIS — J189 Pneumonia, unspecified organism: Secondary | ICD-10-CM

## 2022-02-11 DIAGNOSIS — R0600 Dyspnea, unspecified: Secondary | ICD-10-CM

## 2022-02-11 LAB — GLUCOSE, CAPILLARY
Glucose-Capillary: 188 mg/dL — ABNORMAL HIGH (ref 70–99)
Glucose-Capillary: 223 mg/dL — ABNORMAL HIGH (ref 70–99)
Glucose-Capillary: 399 mg/dL — ABNORMAL HIGH (ref 70–99)
Glucose-Capillary: 469 mg/dL — ABNORMAL HIGH (ref 70–99)

## 2022-02-11 LAB — GLUCOSE, RANDOM: Glucose, Bld: 452 mg/dL — ABNORMAL HIGH (ref 70–99)

## 2022-02-11 MED ORDER — BENZONATATE 100 MG PO CAPS
200.0000 mg | ORAL_CAPSULE | Freq: Three times a day (TID) | ORAL | Status: DC | PRN
Start: 2022-02-11 — End: 2022-02-21
  Administered 2022-02-11 – 2022-02-17 (×6): 200 mg via ORAL
  Filled 2022-02-11 (×7): qty 2

## 2022-02-11 MED ORDER — IRBESARTAN 75 MG PO TABS
75.0000 mg | ORAL_TABLET | Freq: Every day | ORAL | Status: DC
  Filled 2022-02-11: qty 1

## 2022-02-11 MED ORDER — MELATONIN 3 MG PO TABS
3.0000 mg | ORAL_TABLET | Freq: Every day | ORAL | Status: DC
  Administered 2022-02-11 – 2022-02-20 (×11): 3 mg via ORAL
  Filled 2022-02-11 (×11): qty 1

## 2022-02-11 MED ORDER — INSULIN GLARGINE-YFGN 100 UNIT/ML ~~LOC~~ SOLN
8.0000 [IU] | Freq: Every day | SUBCUTANEOUS | Status: DC
Start: 2022-02-11 — End: 2022-02-12
  Administered 2022-02-11: 8 [IU] via SUBCUTANEOUS
  Filled 2022-02-11 (×2): qty 0.08

## 2022-02-11 MED ORDER — IRBESARTAN 75 MG PO TABS
37.5000 mg | ORAL_TABLET | Freq: Every day | ORAL | Status: DC
  Administered 2022-02-11: 37.5 mg via ORAL
  Filled 2022-02-11 (×2): qty 0.5

## 2022-02-11 MED ORDER — FUROSEMIDE 10 MG/ML IJ SOLN
40.0000 mg | Freq: Two times a day (BID) | INTRAMUSCULAR | Status: DC
  Administered 2022-02-11 – 2022-02-16 (×10): 40 mg via INTRAVENOUS
  Filled 2022-02-11 (×10): qty 4

## 2022-02-11 NOTE — Progress Notes (Signed)
eLink Physician-Brief Progress Note Patient Name: Justin [name] DOB: 10-21-1980 MRN: [id number]   Date of Service  02/11/2022  HPI/Events of Note  Dry cough Request for melatonin for sleep  eICU Interventions  Tessalon perles ordered Melatonin ordered     Intervention Category Minor Interventions: Routine modifications to care plan (e.g. PRN medications for pain, fever)  Derald Lorge Mechele Collin 02/11/2022, 1:07 AM

## 2022-02-11 NOTE — Consult Note (Addendum)
Cardiology Consultation:   Patient ID: Justin [name]; [id number]; 1981-04-08   Admit date: 02/09/2022 Date of Consult: 02/11/2022  Primary Care Provider: Patient, No Pcp Per (Inactive) Primary Cardiologist: Jodelle Red, MD  Primary Electrophysiologist:  Patient, No Pcp Per (Inactive)   Patient Profile:   Justin [name] is a 41 y.o. male with a hx of systolic HF who is being seen today for the evaluation of SOB at the request of Dr. Delton Coombes.  History of Present Illness:   Justin [name] has a history of chronic systolic heart failure and was seen previously in 2019.  He has had hypertension not controlled with previous hypertensive urgency and severe LVH.  He had severe LV dysfunction.  He did have previous cardiac catheterization in 2017 demonstrating normal coronaries.  He presented with acute respiratory failure.  On presentation he required BiPAP.  He was managed for pneumonia versus heart failure.  He was treated with diuretics.  He was treated with antibiotics and steroids.  His ejection fraction is 30 to 35%.    BNP was 714.  Of note his ejection fraction in 2019 was 55 to 60%.  However, previous notes mention "severe LV dysfunction."  The patient reports that he is had slowly progressive shortness of breath.  This has been over probably a couple of weeks.  However, last couple of days before presentation he had lower extremity swelling.  He finally presented when he was more acutely short of breath.  He denies any chest pressure, neck or arm discomfort.  He does not usually have palpitations, presyncope or syncope.  He has lived in multiple places and has not been following with a doctor.  He does not take his medications.  History was done through a medical interpreter  Past Medical History:  Diagnosis Date   Heart failure with reduced ejection fraction [facility]) 2017   [facility]   Hypertension 2017   [facility]    History reviewed. No  pertinent surgical history.   Home Medications:  Prior to Admission medications   Medication Sig Start Date End Date Taking? Authorizing Provider  acetaminophen (TYLENOL) 500 MG tablet Take 1,000 mg by mouth every 6 (six) hours as needed for mild pain.   Yes [provider]  carvedilol (COREG) 25 MG tablet Take 1 tablet (25 mg total) by mouth 2 (two) times daily with a meal. Patient not taking: Reported on 02/10/2022 05/30/18   Jodelle Red, MD  fenofibrate 54 MG tablet Take 1 tablet (54 mg total) by mouth daily. Patient not taking: Reported on 02/10/2022 05/30/18   Jodelle Red, MD  furosemide (LASIX) 40 MG tablet Take 1 tablet (40 mg total) by mouth daily. Patient not taking: Reported on 02/10/2022 05/30/18   Jodelle Red, MD  lisinopril (PRINIVIL,ZESTRIL) 20 MG tablet Take 1 tablet (20 mg total) by mouth 2 (two) times daily. Patient not taking: Reported on 02/10/2022 05/30/18   Jodelle Red, MD  spironolactone (ALDACTONE) 25 MG tablet Take 2 tablets (50 mg total) by mouth daily. Patient not taking: Reported on 02/10/2022 05/30/18   Jodelle Red, MD    Inpatient Medications: Scheduled Meds:  carvedilol  12.5 mg Oral BID WC   chlorhexidine  15 mL Mouth Rinse BID   Chlorhexidine Gluconate Cloth  6 each Topical Q0600   heparin injection (subcutaneous)  5,000 Units Subcutaneous Q8H   insulin aspart  0-15 Units Subcutaneous TID AC & HS   mouth rinse  15 mL Mouth Rinse q12n4p  melatonin  3 mg Oral QHS   methylPREDNISolone (SOLU-MEDROL) injection  40 mg Intravenous QHS   nystatin   Topical BID   Continuous Infusions:  cefTRIAXone (ROCEPHIN)  IV Stopped (02/11/22 1241)   PRN Meds: acetaminophen, benzonatate, ipratropium-albuterol  Allergies:   No Known Allergies  Social History:   Social History   Socioeconomic History   Marital status: Single    Spouse name: Not on file   Number of children: Not on file   Years of education: Not on  file   Highest education level: Not on file  Occupational History   Not on file  Tobacco Use   Smoking status: Never   Smokeless tobacco: Never  Substance and Sexual Activity   Alcohol use: Not on file   Drug use: Not on file   Sexual activity: Not on file  Other Topics Concern   Not on file  Social History Narrative   Not on file   Social Determinants of Health   Financial Resource Strain: Not on file  Food Insecurity: Not on file  Transportation Needs: Not on file  Physical Activity: Not on file  Stress: Not on file  Social Connections: Not on file  Intimate Partner Violence: Not on file    Family History:    Family History  Problem Relation Age of Onset   Hypertension Father      ROS:  Please see the history of present illness.  ROS  All other ROS reviewed and negative.     Physical Exam/Data:   Vitals:   02/11/22 1220 02/11/22 1230 02/11/22 1245 02/11/22 1300  BP:    121/71  Pulse: 87 89 84 82  Resp: (!) 30 (!) 22 (!) 31 (!) 24  Temp:      TempSrc:      SpO2: 93% 93% 93% 94%  Weight:      Height:        Intake/Output Summary (Last 24 hours) at 02/11/2022 1453 Last data filed at 02/11/2022 1300 Gross per 24 hour  Intake 862.33 ml  Output 2675 ml  Net -1812.67 ml   Filed Weights   02/09/22 0529  Weight: 129.3 kg   Body mass index is 47.44 kg/m.  GENERAL:  Well appearing HEENT:   Pupils equal round and reactive, fundi not visualized, oral mucosa unremarkable NECK:    Positive jugular venous distention to jaw at 45 degrees, waveform within normal limits, carotid upstroke brisk and symmetric, no bruits, no thyromegaly LYMPHATICS:  No cervical, inguinal adenopathy LUNGS:  Bilateral crackles 2/3 up BACK:  No CVA tenderness CHEST:   Unremarkable HEART:  PMI not displaced or sustained,S1 and S2 within normal limits, no S3, no S4, no clicks, no rubs, no murmurs ABD:  Flat, positive bowel sounds normal in frequency in pitch, no bruits, no rebound, no  guarding, no midline pulsatile mass, no hepatomegaly, no splenomegaly EXT:  2 plus pulses throughout, trace edema, no cyanosis no clubbing SKIN:  No rashes no nodules NEURO:   Cranial nerves II through XII grossly intact, motor grossly intact throughout PSYCH:    Cognitively intact, oriented to person place and time   EKG:  The EKG was personally reviewed and demonstrates: Sinus tachycardia, rate 128, left ventricular hypertrophy with repolarization changes Telemetry:  Telemetry was personally reviewed and demonstrates: Normal sinus rhythm  Relevant CV Studies:  Echo:  1. Left ventricular ejection fraction, by estimation, is 30 to 35%. The  left ventricle has moderately decreased function. The left ventricle  demonstrates global hypokinesis. Left ventricular diastolic parameters are  indeterminate.   2. Right ventricular systolic function is normal. The right ventricular  size is normal.   3. The mitral valve is normal in structure. Mild to moderate mitral valve  regurgitation. No evidence of mitral stenosis.   4. The aortic valve is normal in structure. Aortic valve regurgitation is  not visualized. No aortic stenosis is present.   5. There is mild dilatation of the ascending aorta, measuring 38 mm.   6. The inferior vena cava is normal in size with greater than 50%  respiratory variability, suggesting right atrial pressure of 3 mmHg.   Laboratory Data:  Chemistry Recent Labs  Lab 02/09/22 0415 02/09/22 0542 02/10/22 0102  NA 136 135 138  K 4.1 4.1 4.4  CL 102  --  103  CO2 25  --  26  GLUCOSE 210*  --  165*  BUN 19  --  26*  CREATININE 1.27*  --  1.19  CALCIUM 8.8*  --  8.6*  GFRNONAA >60  --  >60  ANIONGAP 9  --  9    Recent Labs  Lab 02/09/22 0415 02/10/22 0102  PROT 6.6 6.4*  ALBUMIN 2.2* 2.0*  AST 62* 34  ALT 64* 49*  ALKPHOS 140* 126  BILITOT 0.6 0.8   Hematology Recent Labs  Lab 02/09/22 0415 02/09/22 0542 02/10/22 0102  WBC 13.6*  --  10.3   RBC 4.57  --  4.54  HGB 13.4 11.9* 13.3  HCT 40.9 35.0* 40.8  MCV 89.5  --  89.9  MCH 29.3  --  29.3  MCHC 32.8  --  32.6  RDW 13.3  --  13.5  PLT 380  --  353   Cardiac EnzymesNo results for input(s): TROPONINI in the last 168 hours. No results for input(s): TROPIPOC in the last 168 hours.  BNP Recent Labs  Lab 02/09/22 0415  BNP 717.4*    DDimer No results for input(s): DDIMER in the last 168 hours.  Radiology/Studies:  CT Chest W Contrast  Result Date: 02/09/2022 CLINICAL DATA:  Pneumonia, complication suspected EXAM: CT CHEST WITH CONTRAST TECHNIQUE: Multidetector CT imaging of the chest was performed during intravenous contrast administration. RADIATION DOSE REDUCTION: This exam was performed according to the departmental dose-optimization program which includes automated exposure control, adjustment of the mA and/or kV according to patient size and/or use of iterative reconstruction technique. CONTRAST:  98mL OMNIPAQUE IOHEXOL 300 MG/ML  SOLN COMPARISON:  Chest radiograph done earlier today FINDINGS: Cardiovascular: Heart is enlarged in size. There is minimal pericardial effusion. There is homogeneous enhancement in thoracic aorta. Contrast density in the pulmonary artery branches is less than optimal. There is no evidence of any large filling defects in the central pulmonary artery branches. Mediastinum/Nodes: There are enlarged lymph nodes in the mediastinum largest in the subcarinal region measuring approximally 2 x 1.7 cm. Thyroid appears smaller than usual in size. Lungs/Pleura: Extensive patchy alveolar infiltrates are seen in both lungs. There are no cavitary lesions. There is no significant thickening of interlobular septi. There is minimal right pleural effusion. There is no pneumothorax. Upper Abdomen: Unremarkable. Musculoskeletal: Unremarkable. IMPRESSION: Extensive patchy alveolar infiltrates are seen in both lungs suggesting possible multifocal pneumonia. There are no  cavitary lesions in the lung fields. There are enlarged lymph nodes in the mediastinum, possibly suggesting reactive hyperplasia. Follow-up CT chest in 3 months should be considered to assess resolution and to rule out any neoplastic process in the lymph  nodes. Part of this finding may suggest pulmonary edema. There is minimal right pleural effusion. Heart is enlarged in size. Electronically Signed   By: Elmer Picker M.D.   On: 02/09/2022 08:19   DG CHEST PORT 1 VIEW  Result Date: 02/10/2022 CLINICAL DATA:  Hypoxia. EXAM: PORTABLE CHEST 1 VIEW COMPARISON:  Chest CT yesterday at 7:48 a.m. FINDINGS: Moderate cardiomegaly. Central vessels are not well seen although were mildly prominent on CT and probably still are prominent. The mediastinal configuration is stable. Extensive bilateral patchy dense airspace disease continues to be seen with relative apical sparing. The lung opacities more confluent in the left lower lung field than yesterday but otherwise have not changed in the interval. There are bilateral minimal pleural effusions. No acute osseous abnormality. IMPRESSION: 1. Extensive airspace disease, worsened in the left lower zone today and otherwise unchanged. Findings may suggest pneumonia, edema or combination. 2. Cardiomegaly. Electronically Signed   By: Telford Nab M.D.   On: 02/10/2022 05:34   DG Chest Port 1 View  Result Date: 02/09/2022 CLINICAL DATA:  41 year old male with history of severe shortness of breath. EXAM: PORTABLE CHEST 1 VIEW COMPARISON:  Chest x-ray 03/04/2018. FINDINGS: There is marked cephalization of the pulmonary vasculature, severe indistinctness of the interstitial markings, and extensive multifocal airspace disease throughout the lungs bilaterally suggestive of severe pulmonary edema. Small bilateral pleural effusions. No pneumothorax. Severe cardiac enlargement. Upper mediastinal contours are within normal limits. IMPRESSION: 1. The appearance the chest is most  suggestive of severe congestive heart failure. Strictly speaking, the possibility of severe multilobar bilateral pneumonia is not excluded, but not strongly favored. Electronically Signed   By: Vinnie Langton M.D.   On: 02/09/2022 05:33   ECHOCARDIOGRAM COMPLETE BUBBLE STUDY  Result Date: 02/09/2022    ECHOCARDIOGRAM REPORT   Patient Name:   Justin [name] Date of Exam: 02/09/2022 Medical Rec #:  [id number]              Height:       65.0 in Accession #:    [id number]             Weight:       285.1 lb Date of Birth:  1981-07-18              BSA:          2.300 m Patient Age:    39 years               BP:           119/83 mmHg Patient Gender: M                      HR:           95 bpm. Exam Location:  Inpatient Procedure: 2D Echo, Cardiac Doppler, Color Doppler and Saline Contrast Bubble            Study Indications:    CHF  History:        Patient has prior history of Echocardiogram examinations, most                 recent 04/12/2018. Risk Factors:Hypertension.  Sonographer:    Jefferey Pica Referring Phys: [id number] [place]  1. Left ventricular ejection fraction, by estimation, is 30 to 35%. The left ventricle has moderately decreased function. The left ventricle demonstrates global hypokinesis. Left ventricular diastolic parameters are indeterminate.  2. Right ventricular systolic function is normal. The right ventricular  size is normal.  3. The mitral valve is normal in structure. Mild to moderate mitral valve regurgitation. No evidence of mitral stenosis.  4. The aortic valve is normal in structure. Aortic valve regurgitation is not visualized. No aortic stenosis is present.  5. There is mild dilatation of the ascending aorta, measuring 38 mm.  6. The inferior vena cava is normal in size with greater than 50% respiratory variability, suggesting right atrial pressure of 3 mmHg. FINDINGS  Left Ventricle: Left ventricular ejection fraction, by estimation, is 30 to 35%. The left  ventricle has moderately decreased function. The left ventricle demonstrates global hypokinesis. The left ventricular internal cavity size was normal in size. There is no left ventricular hypertrophy. Left ventricular diastolic parameters are indeterminate. Right Ventricle: The right ventricular size is normal. No increase in right ventricular wall thickness. Right ventricular systolic function is normal. Left Atrium: Left atrial size was normal in size. Right Atrium: Right atrial size was normal in size. Pericardium: There is no evidence of pericardial effusion. Mitral Valve: The mitral valve is normal in structure. Mild to moderate mitral valve regurgitation. No evidence of mitral valve stenosis. Tricuspid Valve: The tricuspid valve is normal in structure. Tricuspid valve regurgitation is not demonstrated. No evidence of tricuspid stenosis. Aortic Valve: The aortic valve is normal in structure. Aortic valve regurgitation is not visualized. No aortic stenosis is present. Aortic valve peak gradient measures 8.0 mmHg. Pulmonic Valve: The pulmonic valve was normal in structure. Pulmonic valve regurgitation is not visualized. No evidence of pulmonic stenosis. Aorta: There is mild dilatation of the ascending aorta, measuring 38 mm. Venous: The inferior vena cava is normal in size with greater than 50% respiratory variability, suggesting right atrial pressure of 3 mmHg. IAS/Shunts: No atrial level shunt detected by color flow Doppler. Agitated saline contrast was given intravenously to evaluate for intracardiac shunting.  LEFT VENTRICLE PLAX 2D LVIDd:         6.10 cm      Diastology LVIDs:         5.00 cm      LV e' lateral: 6.68 cm/s LV PW:         1.80 cm LV IVS:        1.70 cm LVOT diam:     2.40 cm LV SV:         76 LV SV Index:   33 LVOT Area:     4.52 cm  LV Volumes (MOD) LV vol d, MOD A4C: 247.0 ml LV vol s, MOD A4C: 152.0 ml LV SV MOD A4C:     247.0 ml RIGHT VENTRICLE             IVC RV S prime:     10.20 cm/s   IVC diam: 2.70 cm TAPSE (M-mode): 1.9 cm LEFT ATRIUM              Index        RIGHT ATRIUM           Index LA diam:        4.80 cm  2.09 cm/m   RA Area:     22.20 cm LA Vol (A2C):   98.1 ml  42.66 ml/m  RA Volume:   67.30 ml  29.27 ml/m LA Vol (A4C):   131.0 ml 56.97 ml/m LA Biplane Vol: 124.0 ml 53.92 ml/m  AORTIC VALVE                 PULMONIC VALVE AV Area (Vmax):  4.09 cm     PV Vmax:       0.80 m/s AV Vmax:        141.50 cm/s  PV Peak grad:  2.6 mmHg AV Peak Grad:   8.0 mmHg LVOT Vmax:      128.00 cm/s LVOT Vmean:     74.000 cm/s LVOT VTI:       0.169 m  AORTA Ao Root diam: 3.50 cm Ao Asc diam:  3.80 cm MR Peak grad: 99.6 mmHg MR Vmax:      499.00 cm/s SHUNTS                           Systemic VTI:  0.17 m                           Systemic Diam: 2.40 cm Kardie Tobb DO Electronically signed by Berniece Salines DO Signature Date/Time: 02/09/2022/1:36:18 PM    Final    US Abdomen Limited RUQ (LIVER/GB)  Result Date: 02/09/2022 CLINICAL DATA:  41 year old male with abnormal LFTs. EXAM: ULTRASOUND ABDOMEN LIMITED RIGHT UPPER QUADRANT COMPARISON:  None Available. FINDINGS: Gallbladder: No gallstones or wall thickening visualized. No sonographic Murphy sign noted by sonographer. Common bile duct: Diameter: 5 mm, normal. Liver: No focal lesion identified. Within normal limits in parenchymal echogenicity (image 31). Portal vein is patent on color Doppler imaging with normal direction of blood flow towards the liver. Other: Negative visible right kidney. IMPRESSION: Negative right upper quadrant ultrasound. Electronically Signed   By: Genevie Ann M.D.   On: 02/09/2022 07:36    Assessment and Plan:   Acute systolic heart failure: I think most of his symptoms are likely more acute systolic heart failure and we had a long discussion about this.  I think he still has acute congestion.  Needs continued IV diuresis.  He is actually started on beta-blocker.  He is not going to be able to afford Entresto or likely [place].   I am going to start with an ARB.  He be able to afford spironolactone.  3 talked about salt and fluid restriction.  Hypertension:  This is being managed in the context of treating his CHF  Diabetes mellitus:  A1C is 9.9.  Certainly Wilder Glade would be but we will have to try to figure out cost.  CKD class II: He has had some mild renal insufficiency so we will have to watch his creatinine closely.  Much regurgitation: This is probably secondary and I will follow this with repeat echoes in the future.   For questions or updates, please contact [place] Please consult [email or web address] for contact info under Cardiology/STEMI.   Signed, Minus Breeding, MD  02/11/2022 2:53 PM

## 2022-02-11 NOTE — Progress Notes (Signed)
[facility] Progress Note Patient Name: Jessiah Hipple DOB: 1980-10-16 MRN: [id number]   Date of Service  02/11/2022  HPI/Events of Note  Glucose in last 24 hours since starting diet [id number]  eICU Interventions  Start semglee 8U nightly Continue SSI     Intervention Category Minor Interventions: Electrolytes abnormality - evaluation and management  Karie Skowron Rodman Pickle 02/11/2022, 9:29 PM

## 2022-02-11 NOTE — Progress Notes (Signed)
NAME:  Justin [name], MRN:  [id number], DOB:  06/23/1981, LOS: 2 ADMISSION DATE:  02/09/2022, CONSULTATION DATE:  02/09/2022 REFERRING MD:  EDP, CHIEF COMPLAINT:  SOB   History of Present Illness:  41 year old man (Spanish-speaking) who presented to [facility] 6/1 for SOB. PMHx significant for HTN, LVH, hypertriglyceridemia. Oxygen sats found to be 70% with pulmonary edema on CXR. Recent diagnosis of PNA, although unclear if he completed a course of abx.   Initial history limited as patient is Spanish-speaking only and translator unable to interpret while NIV in place. Chart review revealed LE swelling x 2-3 days but otherwise limited as to acuity of SOB or any other associated symptoms. Prior history of THC/cocaine use.  PCCM consulted for ICU admission.  Pertinent Medical History:  LVH HTN Hypertriglyceridemia Recent PNA  Significant Hospital Events: Including procedures, antibiotic start and stop dates in addition to other pertinent events   6/1: admitted to [facility] 2/2 pulmonary edema and acute resp failure 6/2 Improved respiratory status, on [place] 12L, BiPAP overnight. O2 sats improved. Continues with CAP coverage, steroids. Net -1.8L/24H, continuing Lasix. 6/3 Did well overnight, on 6L, feeling improved   Interim History / Subjective:  No overnight events, O2 requirement continue to decrease, on 6L EF 30-35%  Breathing feels better Discussed his active problems with spanish interpreter and the need for follow up  Objective:  Blood pressure (!) 141/89, pulse 84, temperature 97.8 F (36.6 C), temperature source Oral, resp. rate (!) 28, height _0  (1.651 m), weight 129.3 kg, SpO2 92 %.        Intake/Output Summary (Last 24 hours) at 02/11/2022 0737 Last data filed at 02/11/2022 0400 Gross per 24 hour  Intake 790.03 ml  Output 1750 ml  Net -959.97 ml    Filed Weights   02/09/22 0529  Weight: 129.3 kg      General:  ill-appearing M, resting in bed on [place] in no acute  distress HEENT: MM pink/moist, sclera anicteric Neuro: awake, alert, oriented and following commands CV: s1s2 , no m/r/g PULM:  no distress on 6L [place], scattered rhonchi and wheezing, good air entry without tachypnea or increased WOB GI: soft, non-distended Extremities: warm/dry, no edema  Skin: no rashes or lesions     Resolved Hospital Problem List:     Assessment & Plan:    Acute hypoxemic respiratory failure Likely 2/2 pulmonary edema, multifocal PNA.  CT Chest 6/1 with extensive patchy alveolar infiltrates bilaterally, suggesting possible multifocal pneumonia; no cavitary lesions; enlarged lymph nodes in the mediastinum, likely reactive.  EF 30-35% - Continue BiPAP PRN, titrate off as able - Continue supplemental O2 support - Wean FiO2 for O2 sat > 90% - Solumedrol x 4 days (end 6/4) - DuoNebs - Pulmonary hygiene - Diuresis as tolerated; got Lasix yesterday  Recent CAP Recently diagnosed with PNA; CXR on admission more c/w pulmonary edema but superimposed PNA very much a possibility in light of leukocytosis and fever. PCT 0.33.  Denies smoking - Trend WBC, fever curve - F/u Cx data - continue ceftriaxone and azithromycin - F/u urine strep, legionella -steroids until 6/4  Transaminitis, improving Elevated alk phos, improving US Abdomen RUQ 6/1 negative for abnormalities. GGT 152. -LFT's down-trending  HFrEF History of LVH Echo 6/1 with EF 30-35%, global hypokinesis; previous Echo 2019 EF 55-50%. CXR appears wet. BNP elevated 717. -new since last echo three years ago, uses cocaine - Continue NIPPV/BiPAP as tolerated - negative 3L since admission, hold further Lasix today  History of  Hypertension On multiple home medications. - Resume Coreg 6/2, monitor HR - Hold resumption of Lisinopril for now   CKD stage 2 Baseline Cr 1.2-1.4. Cr on admission 1.27,  - Trend BMP - Replete electrolytes as indicated - Monitor I&Os - Avoid nephrotoxic agents as able -  Ensure adequate renal perfusion  Bed bugs  Noted. - Supportive care for lesions  Hyperglycemia A1c 9.9. - SSI - CBGs Q4H while NPO, then  AC/HS once tolerating PO - Goal 140s-180s - Consult diabetes coordinator for education  Best Practice (right click and "Reselect all SmartList Selections" daily)   Diet/type: NPO, ADAT if remaining off of BiPAP DVT prophylaxis: LMWH GI prophylaxis: N/A Lines: N/A Foley:  N/A Code Status:  full code Last date of multidisciplinary goals of care discussion [Briefly discussed at bedside 5/31, remains full code]  Critical care time: n/a   Otilio Carpen Robina Hamor, PA-C [facility] 02/11/22 7:37 AM  Please see [email or web address] for pager details.  From 7A-7P if no response, please call [phone] After hours, please call ELink [phone]

## 2022-02-12 LAB — BASIC METABOLIC PANEL
Anion gap: 8 (ref 5–15)
BUN: 49 mg/dL — ABNORMAL HIGH (ref 6–20)
CO2: 27 mmol/L (ref 22–32)
Calcium: 8.8 mg/dL — ABNORMAL LOW (ref 8.9–10.3)
Chloride: 100 mmol/L (ref 98–111)
Creatinine, Ser: 1.29 mg/dL — ABNORMAL HIGH (ref 0.61–1.24)
GFR, Estimated: >60 mL/min (ref >60)
Glucose, Bld: 205 mg/dL — ABNORMAL HIGH (ref 70–99)
Potassium: 4.7 mmol/L (ref 3.5–5.1)
Sodium: 135 mmol/L (ref 135–145)

## 2022-02-12 LAB — CBC
HCT: 41.8 % (ref 39.0–52.0)
Hemoglobin: 13.6 g/dL (ref 13.0–17.0)
MCH: 29.2 pg (ref 26.0–34.0)
MCHC: 32.5 g/dL (ref 30.0–36.0)
MCV: 89.9 fL (ref 80.0–100.0)
Platelets: 451 10*3/uL — ABNORMAL HIGH (ref 150–400)
RBC: 4.65 MIL/uL (ref 4.22–5.81)
RDW: 13.4 % (ref 11.5–15.5)
WBC: 11.9 10*3/uL — ABNORMAL HIGH (ref 4.0–10.5)
nRBC: 0 % (ref 0.0–0.2)

## 2022-02-12 LAB — GLUCOSE, CAPILLARY
Glucose-Capillary: 241 mg/dL — ABNORMAL HIGH (ref 70–99)
Glucose-Capillary: 252 mg/dL — ABNORMAL HIGH (ref 70–99)
Glucose-Capillary: 302 mg/dL — ABNORMAL HIGH (ref 70–99)
Glucose-Capillary: 208 mg/dL — ABNORMAL HIGH (ref 70–99)

## 2022-02-12 MED ORDER — IRBESARTAN 75 MG PO TABS
37.5000 mg | ORAL_TABLET | Freq: Every day | ORAL | Status: DC
  Administered 2022-02-12 – 2022-02-14 (×3): 37.5 mg via ORAL
  Filled 2022-02-12 (×3): qty 0.5

## 2022-02-12 MED ORDER — INSULIN GLARGINE-YFGN 100 UNIT/ML ~~LOC~~ SOLN
18.0000 [IU] | Freq: Every day | SUBCUTANEOUS | Status: DC
Start: 2022-02-12 — End: 2022-02-13
  Administered 2022-02-12 – 2022-02-13 (×2): 18 [IU] via SUBCUTANEOUS
  Filled 2022-02-12 (×2): qty 0.18

## 2022-02-12 MED ORDER — INSULIN GLARGINE-YFGN 100 UNIT/ML ~~LOC~~ SOLN
14.0000 [IU] | Freq: Every day | SUBCUTANEOUS | Status: DC
Start: 2022-02-12 — End: 2022-02-12

## 2022-02-12 MED ORDER — MORPHINE SULFATE (PF) 2 MG/ML IV SOLN: 2.0000 mg | INTRAVENOUS | Status: DC | PRN

## 2022-02-12 MED ORDER — INSULIN ASPART 100 UNIT/ML IJ SOLN
0.0000 [IU] | Freq: Every day | INTRAMUSCULAR | Status: DC
  Administered 2022-02-12: 2 [IU] via SUBCUTANEOUS

## 2022-02-12 MED ORDER — INSULIN ASPART 100 UNIT/ML IJ SOLN
2.0000 [IU] | Freq: Three times a day (TID) | INTRAMUSCULAR | Status: DC
Start: 2022-02-12 — End: 2022-02-14
  Administered 2022-02-12 – 2022-02-14 (×7): 2 [IU] via SUBCUTANEOUS

## 2022-02-12 MED ORDER — INSULIN ASPART 100 UNIT/ML IJ SOLN
0.0000 [IU] | Freq: Three times a day (TID) | INTRAMUSCULAR | Status: DC
  Administered 2022-02-12: 11 [IU] via SUBCUTANEOUS
  Administered 2022-02-12: 8 [IU] via SUBCUTANEOUS
  Administered 2022-02-13: 11 [IU] via SUBCUTANEOUS
  Administered 2022-02-13 (×2): 5 [IU] via SUBCUTANEOUS
  Administered 2022-02-14: 3 [IU] via SUBCUTANEOUS

## 2022-02-12 MED ORDER — AZITHROMYCIN 500 MG PO TABS
500.0000 mg | ORAL_TABLET | Freq: Every day | ORAL | Status: AC
  Administered 2022-02-12 – 2022-02-15 (×4): 500 mg via ORAL
  Filled 2022-02-12 (×4): qty 1

## 2022-02-12 NOTE — Progress Notes (Signed)
PROGRESS NOTE                                                                                                                                                                                                             Patient Demographics:    Justin [name], is a 41 y.o. male, DOB - 10/15/1980, [id number]  Outpatient Primary MD for the patient is Patient, No Pcp Per (Inactive)    LOS - 3  Admit date - 02/09/2022    Chief Complaint  Patient presents with   Shortness of Breath       Brief Narrative (HPI from H&P)   41 year old man (Spanish-speaking) who presented to [facility] 6/1 for SOB. PMHx significant for HTN, LVH, hypertriglyceridemia. Oxygen sats found to be 70% with pulmonary edema on CXR initially required BiPAP was admitted by the pulmonary critical care team, was also seen by cardiology, he was stabilized and transferred to my service on 02/09/2022.   Subjective:    Justin [name] today has, No headache, No chest pain, No abdominal pain - No Nausea, No new weakness tingling or numbness, No SOB.   Assessment  & Plan :    Acute hypoxic respiratory failure due to pulmonary edema and questionable atypical pneumonia. he was initially on BiPAP, has been diuresed on antibiotics, now on room air and much improved, continue IV Lasix, cardiology on board, echo noted with EF 35%, on Coreg, ARB combination which will be continued.  Defer any further cardiology work-up to the cardiology team.  For pneumonia will give him 4 days of oral azithromycin.  CT chest revealing mediastinal lymphadenopathy.  Outpatient follow-up with pulmonary, needs repeat CT scan within 3 months.   Acute on chronic systolic heart failure.  Kindly see #1 above.  Note in 2019 his EF was 55% which has dropped to 35% now.  Cardiology on board.  Management as a #1 above.  History of hypertension.  Stable on current combination of Coreg, ARB and  diuretic.  CKD stage II.  Baseline creatinine around 1.2, monitor, avoid nephrotoxins.   Mild asymptomatic transaminitis due to hepatic congestion.  Almost resolved, right upper quadrant ultrasound stable.  Morbid obesity.  BMI 47.  Outpatient follow-up with PCP for weight loss.  DM type II.  Lantus and sliding scale was adjusted on 02/12/2022, DM and insulin education.  Lab  Results  Component Value Date   HGBA1C 9.9 (H) 02/10/2022   CBG (last 3)  Recent Labs    02/11/22 1707 02/11/22 2043 02/12/22 0819  GLUCAP 399* 469* 241*        Condition - Fair  Family Communication  :  None present  Code Status :  Full  Consults  :  Cards, PCCM  PUD Prophylaxis :    Procedures  :     CTA - Extensive patchy alveolar infiltrates are seen in both lungs suggesting possible multifocal pneumonia. There are no cavitary lesions in the lung fields. There are enlarged lymph nodes in the mediastinum, possibly suggesting reactive hyperplasia. Follow-up CT chest in 3 months should be considered to assess resolution and to rule out any neoplastic process in the lymph nodes. Part of this finding may suggest pulmonary edema. There is minimal right pleural effusion. Heart is enlarged in size.   RUQ [place] - stable  TTE -  1. Left ventricular ejection fraction, by estimation, is 30 to 35%. The left ventricle has moderately decreased function. The left ventricle demonstrates global hypokinesis. Left ventricular diastolic parameters are indeterminate.  2. Right ventricular systolic function is normal. The right ventricular size is normal.  3. The mitral valve is normal in structure. Mild to moderate mitral valve regurgitation. No evidence of mitral stenosis.  4. The aortic valve is normal in structure. Aortic valve regurgitation is not visualized. No aortic stenosis is present.  5. There is mild dilatation of the ascending aorta, measuring 38 mm.  6. The inferior vena cava is normal in size with greater than  50% respiratory variability, suggesting right atrial pressure of 3 mmHg.      Disposition Plan  :    Status is: Inpatient  DVT Prophylaxis  :    heparin injection 5,000 Units Start: 02/09/22 1400    Lab Results  Component Value Date   PLT 451 (H) 02/12/2022    Diet :  Diet Order             Diet Carb Modified Fluid consistency: Thin; Room service appropriate? Yes  Diet effective now                    Inpatient Medications  Scheduled Meds:  carvedilol  12.5 mg Oral BID WC   chlorhexidine  15 mL Mouth Rinse BID   Chlorhexidine Gluconate Cloth  6 each Topical Q0600   furosemide  40 mg Intravenous Q12H   heparin injection (subcutaneous)  5,000 Units Subcutaneous Q8H   insulin aspart  0-15 Units Subcutaneous TID AC & HS   insulin glargine-yfgn  8 Units Subcutaneous QHS   irbesartan  37.5 mg Oral Daily   mouth rinse  15 mL Mouth Rinse q12n4p   melatonin  3 mg Oral QHS   methylPREDNISolone (SOLU-MEDROL) injection  40 mg Intravenous QHS   nystatin   Topical BID   Continuous Infusions:  cefTRIAXone (ROCEPHIN)  IV Stopped (02/11/22 1241)   PRN Meds:.acetaminophen, benzonatate, ipratropium-albuterol  Time Spent in minutes  30   Lala Lund M.D on 02/12/2022 at 10:10 AM  To page go to [email or web address]   [facility] -  Office  [phone]  See all Orders from today for further details    Objective:   Vitals:   02/11/22 1946 02/11/22 2349 02/12/22 0315 02/12/22 0800  BP:  125/86 111/80 120/80  Pulse:  81 81 88  Resp:  (!) 21 20 (!) 25  Temp:  97.9 F (  36.6 C) 98 F (36.7 C) (!) 97.5 F (36.4 C)  TempSrc:  Oral Oral Oral  SpO2: 92% 98% 90% 95%  Weight:      Height:        Wt Readings from Last 3 Encounters:  02/09/22 129.3 kg  08/23/18 129.3 kg  05/30/18 123.1 kg     Intake/Output Summary (Last 24 hours) at 02/12/2022 1010 Last data filed at 02/12/2022 [id number] Gross per 24 hour  Intake 172.3 ml  Output 2635 ml  Net -2462.7 ml      Physical Exam  Awake Alert, No new F.N deficits, Normal affect [place].AT,PERRAL Supple Neck, No JVD,   Symmetrical Chest wall movement, Good air movement bilaterally, CTAB RRR,No Gallops,Rubs or new Murmurs,  +ve B.Sounds, Abd Soft, No tenderness,   No Cyanosis, Clubbing or edema       Data Review:    CBC Recent Labs  Lab 02/09/22 0415 02/09/22 0542 02/10/22 0102 02/12/22 0151  WBC 13.6*  --  10.3 11.9*  HGB 13.4 11.9* 13.3 13.6  HCT 40.9 35.0* 40.8 41.8  PLT 380  --  353 451*  MCV 89.5  --  89.9 89.9  MCH 29.3  --  29.3 29.2  MCHC 32.8  --  32.6 32.5  RDW 13.3  --  13.5 13.4  LYMPHSABS 1.1  --   --   --   MONOABS 0.9  --   --   --   EOSABS 0.1  --   --   --   BASOSABS 0.1  --   --   --     Electrolytes Recent Labs  Lab 02/09/22 0415 02/09/22 0536 02/09/22 0542 02/10/22 0102 02/11/22 2120 02/12/22 0151  NA 136  --  135 138  --  135  K 4.1  --  4.1 4.4  --  4.7  CL 102  --   --  103  --  100  CO2 25  --   --  26  --  27  GLUCOSE 210*  --   --  165* 452* 205*  BUN 19  --   --  26*  --  49*  CREATININE 1.27*  --   --  1.19  --  1.29*  CALCIUM 8.8*  --   --  8.6*  --  8.8*  AST 62*  --   --  34  --   --   ALT 64*  --   --  49*  --   --   ALKPHOS 140*  --   --  126  --   --   BILITOT 0.6  --   --  0.8  --   --   ALBUMIN 2.2*  --   --  2.0*  --   --   MG  --   --   --  2.2  --   --   PROCALCITON 0.33  --   --   --   --   --   LATICACIDVEN 1.3 1.0  --   --   --   --   INR 1.2  --   --   --   --   --   HGBA1C  --   --   --  9.9*  --   --   BNP 717.4*  --   --   --   --   --     ------------------------------------------------------------------------------------------------------------------ No results for input(s): CHOL, HDL, LDLCALC, TRIG, CHOLHDL, LDLDIRECT in  the last 72 hours.  Lab Results  Component Value Date   HGBA1C 9.9 (H) 02/10/2022        Radiology Reports CT Chest W Contrast  Result Date: 02/09/2022 CLINICAL DATA:  Pneumonia,  complication suspected EXAM: CT CHEST WITH CONTRAST TECHNIQUE: Multidetector CT imaging of the chest was performed during intravenous contrast administration. RADIATION DOSE REDUCTION: This exam was performed according to the departmental dose-optimization program which includes automated exposure control, adjustment of the mA and/or kV according to patient size and/or use of iterative reconstruction technique. CONTRAST:  79mL OMNIPAQUE IOHEXOL 300 MG/ML  SOLN COMPARISON:  Chest radiograph done earlier today FINDINGS: Cardiovascular: Heart is enlarged in size. There is minimal pericardial effusion. There is homogeneous enhancement in thoracic aorta. Contrast density in the pulmonary artery branches is less than optimal. There is no evidence of any large filling defects in the central pulmonary artery branches. Mediastinum/Nodes: There are enlarged lymph nodes in the mediastinum largest in the subcarinal region measuring approximally 2 x 1.7 cm. Thyroid appears smaller than usual in size. Lungs/Pleura: Extensive patchy alveolar infiltrates are seen in both lungs. There are no cavitary lesions. There is no significant thickening of interlobular septi. There is minimal right pleural effusion. There is no pneumothorax. Upper Abdomen: Unremarkable. Musculoskeletal: Unremarkable. IMPRESSION: Extensive patchy alveolar infiltrates are seen in both lungs suggesting possible multifocal pneumonia. There are no cavitary lesions in the lung fields. There are enlarged lymph nodes in the mediastinum, possibly suggesting reactive hyperplasia. Follow-up CT chest in 3 months should be considered to assess resolution and to rule out any neoplastic process in the lymph nodes. Part of this finding may suggest pulmonary edema. There is minimal right pleural effusion. Heart is enlarged in size. Electronically Signed   By: Elmer Picker M.D.   On: 02/09/2022 08:19   DG CHEST PORT 1 VIEW  Result Date: 02/10/2022 CLINICAL DATA:   Hypoxia. EXAM: PORTABLE CHEST 1 VIEW COMPARISON:  Chest CT yesterday at 7:48 a.m. FINDINGS: Moderate cardiomegaly. Central vessels are not well seen although were mildly prominent on CT and probably still are prominent. The mediastinal configuration is stable. Extensive bilateral patchy dense airspace disease continues to be seen with relative apical sparing. The lung opacities more confluent in the left lower lung field than yesterday but otherwise have not changed in the interval. There are bilateral minimal pleural effusions. No acute osseous abnormality. IMPRESSION: 1. Extensive airspace disease, worsened in the left lower zone today and otherwise unchanged. Findings may suggest pneumonia, edema or combination. 2. Cardiomegaly. Electronically Signed   By: Telford Nab M.D.   On: 02/10/2022 05:34   DG Chest Port 1 View  Result Date: 02/09/2022 CLINICAL DATA:  41 year old male with history of severe shortness of breath. EXAM: PORTABLE CHEST 1 VIEW COMPARISON:  Chest x-ray 03/04/2018. FINDINGS: There is marked cephalization of the pulmonary vasculature, severe indistinctness of the interstitial markings, and extensive multifocal airspace disease throughout the lungs bilaterally suggestive of severe pulmonary edema. Small bilateral pleural effusions. No pneumothorax. Severe cardiac enlargement. Upper mediastinal contours are within normal limits. IMPRESSION: 1. The appearance the chest is most suggestive of severe congestive heart failure. Strictly speaking, the possibility of severe multilobar bilateral pneumonia is not excluded, but not strongly favored. Electronically Signed   By: Vinnie Langton M.D.   On: 02/09/2022 05:33   ECHOCARDIOGRAM COMPLETE BUBBLE STUDY  Result Date: 02/09/2022    ECHOCARDIOGRAM REPORT   Patient Name:   Justin [name] Date of Exam: 02/09/2022 Medical Rec #:  [id number]              Height:       65.0 in Accession #:    [id number]             Weight:       285.1 lb Date of  Birth:  [birth year]              BSA:          2.300 m Patient Age:    46 years               BP:           119/83 mmHg Patient Gender: M                      HR:           95 bpm. Exam Location:  Inpatient Procedure: 2D Echo, Cardiac Doppler, Color Doppler and Saline Contrast Bubble            Study Indications:    CHF  History:        Patient has prior history of Echocardiogram examinations, most                 recent 04/12/2018. Risk Factors:Hypertension.  Sonographer:    Jefferey Pica Referring Phys: [id number] [place]  1. Left ventricular ejection fraction, by estimation, is 30 to 35%. The left ventricle has moderately decreased function. The left ventricle demonstrates global hypokinesis. Left ventricular diastolic parameters are indeterminate.  2. Right ventricular systolic function is normal. The right ventricular size is normal.  3. The mitral valve is normal in structure. Mild to moderate mitral valve regurgitation. No evidence of mitral stenosis.  4. The aortic valve is normal in structure. Aortic valve regurgitation is not visualized. No aortic stenosis is present.  5. There is mild dilatation of the ascending aorta, measuring 38 mm.  6. The inferior vena cava is normal in size with greater than 50% respiratory variability, suggesting right atrial pressure of 3 mmHg. FINDINGS  Left Ventricle: Left ventricular ejection fraction, by estimation, is 30 to 35%. The left ventricle has moderately decreased function. The left ventricle demonstrates global hypokinesis. The left ventricular internal cavity size was normal in size. There is no left ventricular hypertrophy. Left ventricular diastolic parameters are indeterminate. Right Ventricle: The right ventricular size is normal. No increase in right ventricular wall thickness. Right ventricular systolic function is normal. Left Atrium: Left atrial size was normal in size. Right Atrium: Right atrial size was normal in size. Pericardium: There  is no evidence of pericardial effusion. Mitral Valve: The mitral valve is normal in structure. Mild to moderate mitral valve regurgitation. No evidence of mitral valve stenosis. Tricuspid Valve: The tricuspid valve is normal in structure. Tricuspid valve regurgitation is not demonstrated. No evidence of tricuspid stenosis. Aortic Valve: The aortic valve is normal in structure. Aortic valve regurgitation is not visualized. No aortic stenosis is present. Aortic valve peak gradient measures 8.0 mmHg. Pulmonic Valve: The pulmonic valve was normal in structure. Pulmonic valve regurgitation is not visualized. No evidence of pulmonic stenosis. Aorta: There is mild dilatation of the ascending aorta, measuring 38 mm. Venous: The inferior vena cava is normal in size with greater than 50% respiratory variability, suggesting right atrial pressure of 3 mmHg. IAS/Shunts: No atrial level shunt detected by color flow Doppler. Agitated saline contrast was given intravenously to evaluate for intracardiac shunting.  LEFT VENTRICLE PLAX 2D  LVIDd:         6.10 cm      Diastology LVIDs:         5.00 cm      LV e' lateral: 6.68 cm/s LV PW:         1.80 cm LV IVS:        1.70 cm LVOT diam:     2.40 cm LV SV:         76 LV SV Index:   33 LVOT Area:     4.52 cm  LV Volumes (MOD) LV vol d, MOD A4C: 247.0 ml LV vol s, MOD A4C: 152.0 ml LV SV MOD A4C:     247.0 ml RIGHT VENTRICLE             IVC RV S prime:     10.20 cm/s  IVC diam: 2.70 cm TAPSE (M-mode): 1.9 cm LEFT ATRIUM              Index        RIGHT ATRIUM           Index LA diam:        4.80 cm  2.09 cm/m   RA Area:     22.20 cm LA Vol (A2C):   98.1 ml  42.66 ml/m  RA Volume:   67.30 ml  29.27 ml/m LA Vol (A4C):   131.0 ml 56.97 ml/m LA Biplane Vol: 124.0 ml 53.92 ml/m  AORTIC VALVE                 PULMONIC VALVE AV Area (Vmax): 4.09 cm     PV Vmax:       0.80 m/s AV Vmax:        141.50 cm/s  PV Peak grad:  2.6 mmHg AV Peak Grad:   8.0 mmHg LVOT Vmax:      128.00 cm/s LVOT  Vmean:     74.000 cm/s LVOT VTI:       0.169 m  AORTA Ao Root diam: 3.50 cm Ao Asc diam:  3.80 cm MR Peak grad: 99.6 mmHg MR Vmax:      499.00 cm/s SHUNTS                           Systemic VTI:  0.17 m                           Systemic Diam: 2.40 cm Kardie Tobb DO Electronically signed by Berniece Salines DO Signature Date/Time: 02/09/2022/1:36:18 PM    Final    US Abdomen Limited RUQ (LIVER/GB)  Result Date: 02/09/2022 CLINICAL DATA:  41 year old male with abnormal LFTs. EXAM: ULTRASOUND ABDOMEN LIMITED RIGHT UPPER QUADRANT COMPARISON:  None Available. FINDINGS: Gallbladder: No gallstones or wall thickening visualized. No sonographic Murphy sign noted by sonographer. Common bile duct: Diameter: 5 mm, normal. Liver: No focal lesion identified. Within normal limits in parenchymal echogenicity (image 31). Portal vein is patent on color Doppler imaging with normal direction of blood flow towards the liver. Other: Negative visible right kidney. IMPRESSION: Negative right upper quadrant ultrasound. Electronically Signed   By: Genevie Ann M.D.   On: 02/09/2022 07:36

## 2022-02-12 NOTE — Progress Notes (Signed)
Progress Note  Patient Name: Justin [name] Date of Encounter: 02/12/2022  Primary Cardiologist:   Jodelle Red, MD   Subjective   He says he is breathing OK but has cough with productive sputum.  Denies pain  Interpreter service utilized.   Inpatient Medications    Scheduled Meds:  azithromycin  500 mg Oral Daily   carvedilol  12.5 mg Oral BID WC   chlorhexidine  15 mL Mouth Rinse BID   Chlorhexidine Gluconate Cloth  6 each Topical Q0600   furosemide  40 mg Intravenous Q12H   heparin injection (subcutaneous)  5,000 Units Subcutaneous Q8H   insulin aspart  0-15 Units Subcutaneous TID WC   insulin aspart  0-5 Units Subcutaneous QHS   insulin aspart  2 Units Subcutaneous TID WC   insulin glargine-yfgn  18 Units Subcutaneous Daily   irbesartan  37.5 mg Oral Daily   mouth rinse  15 mL Mouth Rinse q12n4p   melatonin  3 mg Oral QHS   methylPREDNISolone (SOLU-MEDROL) injection  40 mg Intravenous QHS   nystatin   Topical BID   Continuous Infusions:  PRN Meds: acetaminophen, benzonatate, ipratropium-albuterol   Vital Signs    Vitals:   02/11/22 1946 02/11/22 2349 02/12/22 0315 02/12/22 0800  BP:  125/86 111/80 120/80  Pulse:  81 81 88  Resp:  (!) 21 20 (!) 25  Temp:  97.9 F (36.6 C) 98 F (36.7 C) (!) 97.5 F (36.4 C)  TempSrc:  Oral Oral Oral  SpO2: 92% 98% 90% 95%  Weight:      Height:        Intake/Output Summary (Last 24 hours) at 02/12/2022 1031 Last data filed at 02/12/2022 6237 Gross per 24 hour  Intake 172.3 ml  Output 2635 ml  Net -2462.7 ml   Filed Weights   02/09/22 0529  Weight: 129.3 kg    Telemetry    NSR - Personally Reviewed  ECG    NA - Personally Reviewed  Physical Exam   GEN: No acute distress.   Neck: No  JVD Cardiac: RRR, no murmurs, rubs, or gallops.  Respiratory:      Bilateral basilar crackles GI: Soft, nontender, non-distended  MS: No edema; No deformity. Neuro:  Nonfocal  Psych: Normal affect   Labs     Chemistry Recent Labs  Lab 02/09/22 0415 02/09/22 0542 02/10/22 0102 02/11/22 2120 02/12/22 0151  NA 136 135 138  --  135  K 4.1 4.1 4.4  --  4.7  CL 102  --  103  --  100  CO2 25  --  26  --  27  GLUCOSE 210*  --  165* 452* 205*  BUN 19  --  26*  --  49*  CREATININE 1.27*  --  1.19  --  1.29*  CALCIUM 8.8*  --  8.6*  --  8.8*  PROT 6.6  --  6.4*  --   --   ALBUMIN 2.2*  --  2.0*  --   --   AST 62*  --  34  --   --   ALT 64*  --  49*  --   --   ALKPHOS 140*  --  126  --   --   BILITOT 0.6  --  0.8  --   --   GFRNONAA >60  --  >60  --  >60  ANIONGAP 9  --  9  --  8     Hematology Recent  Labs  Lab 02/09/22 0415 02/09/22 0542 02/10/22 0102 02/12/22 0151  WBC 13.6*  --  10.3 11.9*  RBC 4.57  --  4.54 4.65  HGB 13.4 11.9* 13.3 13.6  HCT 40.9 35.0* 40.8 41.8  MCV 89.5  --  89.9 89.9  MCH 29.3  --  29.3 29.2  MCHC 32.8  --  32.6 32.5  RDW 13.3  --  13.5 13.4  PLT 380  --  353 451*    Cardiac EnzymesNo results for input(s): TROPONINI in the last 168 hours. No results for input(s): TROPIPOC in the last 168 hours.   BNP Recent Labs  Lab 02/09/22 0415  BNP 717.4*     DDimer No results for input(s): DDIMER in the last 168 hours.   Radiology    No results found.  Cardiac Studies   ECHO:     1. Left ventricular ejection fraction, by estimation, is 30 to 35%. The  left ventricle has moderately decreased function. The left ventricle  demonstrates global hypokinesis. Left ventricular diastolic parameters are  indeterminate.   2. Right ventricular systolic function is normal. The right ventricular  size is normal.   3. The mitral valve is normal in structure. Mild to moderate mitral valve  regurgitation. No evidence of mitral stenosis.   4. The aortic valve is normal in structure. Aortic valve regurgitation is  not visualized. No aortic stenosis is present.   5. There is mild dilatation of the ascending aorta, measuring 38 mm.   6. The inferior vena cava  is normal in size with greater than 50%  respiratory variability, suggesting right atrial pressure of 3 mmHg.   Patient Profile     41 y.o. male with a hx of systolic HF who is being seen today for the evaluation of SOB at the request of Dr. Delton Coombes.  Assessment & Plan    Acute systolic heart failure:   Net negative 6.3 liters.  Good UO yesterday.  Continue current IV Lasix.  Cannot afford Sherryll Burger so will continue current meds.    Hypertension:  Context    Diabetes mellitus:  A1C is 9.9.  Certainly Marcelline Deist would be but we will have to try to figure out cost.   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     Much regurgitation: Repeat echo in the future.   For questions or updates, please contact [facility] Please consult [email or web address] for contact info under Cardiology/STEMI.   Signed, Rollene Rotunda, MD  02/12/2022, 10:31 AM

## 2022-02-12 NOTE — Progress Notes (Addendum)
Contacted Infection Prevention pertaining to the precautions for Coronavirus Oc43. Standard isolation precautions are needed and in place. Airborne & Contact isolations have been discontinued.

## 2022-02-13 ENCOUNTER — Other Ambulatory Visit (HOSPITAL_COMMUNITY): Payer: Self-pay

## 2022-02-13 ENCOUNTER — Inpatient Hospital Stay (HOSPITAL_COMMUNITY): Payer: Self-pay

## 2022-02-13 DIAGNOSIS — I10 Essential (primary) hypertension: Secondary | ICD-10-CM

## 2022-02-13 DIAGNOSIS — I5043 Acute on chronic combined systolic (congestive) and diastolic (congestive) heart failure: Secondary | ICD-10-CM

## 2022-02-13 DIAGNOSIS — I340 Nonrheumatic mitral (valve) insufficiency: Secondary | ICD-10-CM

## 2022-02-13 LAB — GLUCOSE, CAPILLARY
Glucose-Capillary: 205 mg/dL — ABNORMAL HIGH (ref 70–99)
Glucose-Capillary: 222 mg/dL — ABNORMAL HIGH (ref 70–99)
Glucose-Capillary: 316 mg/dL — ABNORMAL HIGH (ref 70–99)
Glucose-Capillary: 154 mg/dL — ABNORMAL HIGH (ref 70–99)

## 2022-02-13 LAB — COMPREHENSIVE METABOLIC PANEL
ALT: 34 U/L (ref 0–44)
AST: 20 U/L (ref 15–41)
Albumin: 2.3 g/dL — ABNORMAL LOW (ref 3.5–5.0)
Alkaline Phosphatase: 109 U/L (ref 38–126)
Anion gap: 8 (ref 5–15)
BUN: 47 mg/dL — ABNORMAL HIGH (ref 6–20)
CO2: 32 mmol/L (ref 22–32)
Calcium: 9.1 mg/dL (ref 8.9–10.3)
Chloride: 96 mmol/L — ABNORMAL LOW (ref 98–111)
Creatinine, Ser: 1.34 mg/dL — ABNORMAL HIGH (ref 0.61–1.24)
GFR, Estimated: >60 mL/min (ref >60)
Glucose, Bld: 231 mg/dL — ABNORMAL HIGH (ref 70–99)
Potassium: 5 mmol/L (ref 3.5–5.1)
Sodium: 136 mmol/L (ref 135–145)
Total Bilirubin: 0.5 mg/dL (ref 0.3–1.2)
Total Protein: 6.4 g/dL — ABNORMAL LOW (ref 6.5–8.1)

## 2022-02-13 LAB — CBC WITH DIFFERENTIAL/PLATELET
Abs Immature Granulocytes: 0.06 10*3/uL (ref 0.00–0.07)
Basophils Absolute: 0 10*3/uL (ref 0.0–0.1)
Basophils Relative: 0 %
Eosinophils Absolute: 0 10*3/uL (ref 0.0–0.5)
Eosinophils Relative: 0 %
HCT: 43.2 % (ref 39.0–52.0)
Hemoglobin: 14 g/dL (ref 13.0–17.0)
Immature Granulocytes: 1 %
Lymphocytes Relative: 6 %
Lymphs Abs: 0.6 10*3/uL — ABNORMAL LOW (ref 0.7–4.0)
MCH: 29.1 pg (ref 26.0–34.0)
MCHC: 32.4 g/dL (ref 30.0–36.0)
MCV: 89.8 fL (ref 80.0–100.0)
Monocytes Absolute: 0.4 10*3/uL (ref 0.1–1.0)
Monocytes Relative: 3 %
Neutro Abs: 10.1 10*3/uL — ABNORMAL HIGH (ref 1.7–7.7)
Neutrophils Relative %: 90 %
Platelets: 429 10*3/uL — ABNORMAL HIGH (ref 150–400)
RBC: 4.81 MIL/uL (ref 4.22–5.81)
RDW: 13.2 % (ref 11.5–15.5)
WBC: 11.2 10*3/uL — ABNORMAL HIGH (ref 4.0–10.5)
nRBC: 0 % (ref 0.0–0.2)

## 2022-02-13 LAB — LEGIONELLA PNEUMOPHILA SEROGP 1 UR AG: L. pneumophila Serogp 1 Ur Ag: NEGATIVE

## 2022-02-13 LAB — BRAIN NATRIURETIC PEPTIDE: B Natriuretic Peptide: 962.4 pg/mL — ABNORMAL HIGH (ref 0.0–100.0)

## 2022-02-13 LAB — MAGNESIUM: Magnesium: 2.3 mg/dL (ref 1.7–2.4)

## 2022-02-13 MED ORDER — INSULIN GLARGINE-YFGN 100 UNIT/ML ~~LOC~~ SOLN
22.0000 [IU] | Freq: Every day | SUBCUTANEOUS | Status: DC
Start: 2022-02-14 — End: 2022-02-14
  Administered 2022-02-14: 22 [IU] via SUBCUTANEOUS
  Filled 2022-02-13: qty 0.22

## 2022-02-13 MED ORDER — LIVING WELL WITH DIABETES BOOK - IN SPANISH
Freq: Once | Status: AC
Start: 2022-02-13 — End: 2022-02-13
  Filled 2022-02-13: qty 1

## 2022-02-13 MED ORDER — INSULIN GLARGINE-YFGN 100 UNIT/ML ~~LOC~~ SOLN
4.0000 [IU] | Freq: Once | SUBCUTANEOUS | Status: AC
  Administered 2022-02-13: 4 [IU] via SUBCUTANEOUS
  Filled 2022-02-13: qty 0.04

## 2022-02-13 NOTE — Progress Notes (Addendum)
Inpatient Diabetes Program Recommendations  AACE/ADA: New Consensus Statement on Inpatient Glycemic Control (2015)  Target Ranges:  Prepandial:   less than 140 mg/dL      Peak postprandial:   less than 180 mg/dL (1-2 hours)      Critically ill patients:  140 - 180 mg/dL   Lab Results  Component Value Date   GLUCAP 222 (H) 02/13/2022   HGBA1C 9.9 (H) 02/10/2022    Review of Glycemic Control  Latest Reference Range & Units 02/12/22 08:19 02/12/22 12:49 02/12/22 17:15 02/12/22 21:15 02/13/22 07:29 02/13/22 12:00  Glucose-Capillary 70 - 99 mg/dL 975 (H) 883 (H) 254 (H) 208 (H) 205 (H) 222 (H)   Diabetes history: DM 2- New diagnosis Outpatient Diabetes medications:  None Current orders for Inpatient glycemic control:  Novolog moderate tid with meals and HS Novolog 2 units tid with meals Semglee 22 units daily Inpatient Diabetes Program Recommendations:    Using translator [phone]), spoke with patient regarding new DM diagnosis. Note that patient also received IV solumedrol for 4 days which likely increased blood sugars as well.  Spoke with pt about new diagnosis.  Discussed A1C results with him and explained what an A1C is, basic pathophysiology of DM Type 2, basic home care, importance of checking CBGs and maintaining good CBG control to prevent long-term and short-term complications.  Reviewed signs and symptoms of hyperglycemia and hypoglycemia.  Will order Spanish DM educational booklet as well.   No insurance listed.  May do best with generic DM medications such as Metformin and/or Glipzide due to cost.  Explained to patient that he will also need PCP for DM management. Briefly discussed monitoring and [employer] meter.  We also discussed basic diet information including eliminating sugar from beverages, sources of CHO and importance of eating more non-starchy veggies.  Patient appreciative of information. He asked about beer and I told him that alcohol can affect blood sugars and that no  more than 2 beers per day.  Will follow.   Thanks,  Beryl Meager, RN, BC-ADM Inpatient Diabetes Coordinator Pager [phone]   (8a-5p)

## 2022-02-13 NOTE — Progress Notes (Signed)
PROGRESS NOTE                                                                                                                                                                                                             Patient Demographics:    Justin [name], is a 41 y.o. male, DOB - 24-Nov-1980, [id number]  Outpatient Primary MD for the patient is Patient, No Pcp Per (Inactive)    LOS - 4  Admit date - 02/09/2022    Chief Complaint  Patient presents with   Shortness of Breath       Brief Narrative (HPI from H&P)   41 year old man (Spanish-speaking) who presented to [facility] 6/1 for SOB. PMHx significant for HTN, LVH, hypertriglyceridemia. Oxygen sats found to be 70% with pulmonary edema on CXR initially required BiPAP was admitted by the pulmonary critical care team, was also seen by cardiology, he was stabilized and transferred to my service on 02/09/2022.   Subjective:   Patient in bed, appears comfortable, denies any headache, no fever, no chest pain or pressure, no shortness of breath , no abdominal pain. No new focal weakness.    Assessment  & Plan :    Acute hypoxic respiratory failure due to pulmonary edema and questionable atypical pneumonia. he was initially on BiPAP, has been diuresed and now on 2 L nasal cannula, cardiology on board, echo noted with EF 35% which is down from 55% in 2019, also some history of noncompliance with cardiologist,, on IV Lasix, Coreg, ARB combination which will be continued.  Defer any further cardiology work-up to the cardiology team.  For pneumonia will give him 4 days of oral azithromycin.  CKD stage IIIa.  Baseline creatinine around 1.3, monitor, avoid nephrotoxins.  He is developing mild hyperkalemia, if renal function worsens or hyperkalemia gets worse we might have to switch ARB to BiDil, defer to Cards.  Acute on chronic systolic heart failure.  Kindly see #1 above.  Note in 2019  his EF was 55% which has dropped to 35% now.  Cardiology on board.  Management as a #1 above.  CT chest revealing mediastinal lymphadenopathy.  Outpatient follow-up with pulmonary, needs repeat CT scan within 3 months.   History of hypertension.  Stable on current combination of Coreg, ARB and diuretic.  Mild asymptomatic transaminitis due to hepatic congestion.  Almost  resolved, right upper quadrant ultrasound stable.  Morbid obesity.  BMI 47.  Outpatient follow-up with PCP for weight loss.  DM type II.  Lantus and sliding scale, dose was adjusted further on 02/13/2022, DM and insulin education.  Lab Results  Component Value Date   HGBA1C 9.9 (H) 02/10/2022   CBG (last 3)  Recent Labs    02/12/22 1715 02/12/22 2115 02/13/22 0729  GLUCAP 302* 208* 205*        Condition - Fair  Family Communication  :  None present  Code Status :  Full  Consults  :  Cards, PCCM  PUD Prophylaxis :    Procedures  :     CTA - Extensive patchy alveolar infiltrates are seen in both lungs suggesting possible multifocal pneumonia. There are no cavitary lesions in the lung fields. There are enlarged lymph nodes in the mediastinum, possibly suggesting reactive hyperplasia. Follow-up CT chest in 3 months should be considered to assess resolution and to rule out any neoplastic process in the lymph nodes. Part of this finding may suggest pulmonary edema. There is minimal right pleural effusion. Heart is enlarged in size.   RUQ [place] - stable  TTE -  1. Left ventricular ejection fraction, by estimation, is 30 to 35%. The left ventricle has moderately decreased function. The left ventricle demonstrates global hypokinesis. Left ventricular diastolic parameters are indeterminate.  2. Right ventricular systolic function is normal. The right ventricular size is normal.  3. The mitral valve is normal in structure. Mild to moderate mitral valve regurgitation. No evidence of mitral stenosis.  4. The aortic valve is  normal in structure. Aortic valve regurgitation is not visualized. No aortic stenosis is present.  5. There is mild dilatation of the ascending aorta, measuring 38 mm.  6. The inferior vena cava is normal in size with greater than 50% respiratory variability, suggesting right atrial pressure of 3 mmHg.      Disposition Plan  :    Status is: Inpatient  DVT Prophylaxis  :    heparin injection 5,000 Units Start: 02/09/22 1400    Lab Results  Component Value Date   PLT 429 (H) 02/13/2022    Diet :  Diet Order             Diet Carb Modified Fluid consistency: Thin; Room service appropriate? Yes with Assist  Diet effective now                    Inpatient Medications  Scheduled Meds:  azithromycin  500 mg Oral Daily   carvedilol  12.5 mg Oral BID WC   chlorhexidine  15 mL Mouth Rinse BID   Chlorhexidine Gluconate Cloth  6 each Topical Q0600   furosemide  40 mg Intravenous Q12H   heparin injection (subcutaneous)  5,000 Units Subcutaneous Q8H   insulin aspart  0-15 Units Subcutaneous TID WC   insulin aspart  0-5 Units Subcutaneous QHS   insulin aspart  2 Units Subcutaneous TID WC   insulin glargine-yfgn  18 Units Subcutaneous Daily   irbesartan  37.5 mg Oral Daily   mouth rinse  15 mL Mouth Rinse q12n4p   melatonin  3 mg Oral QHS   nystatin   Topical BID   Continuous Infusions:   PRN Meds:.acetaminophen, benzonatate, ipratropium-albuterol  Time Spent in minutes  30   Lala Lund M.D on 02/13/2022 at 10:15 AM  To page go to [email or web address]   [facility] -  Office  [phone]  See all Orders from today for further details    Objective:   Vitals:   02/12/22 2020 02/13/22 0000 02/13/22 0333 02/13/22 0729  BP: 125/87 (!) 140/128 (!) 162/121 (!) 161/112  Pulse: 80 78 83 86  Resp: 20  18 20   Temp: 98 F (36.7 C) 97.8 F (36.6 C) 98 F (36.7 C) 98 F (36.7 C)  TempSrc: Oral Oral Oral Oral  SpO2: 97% 96% 95% 96%  Weight:      Height:         Wt Readings from Last 3 Encounters:  02/09/22 129.3 kg  08/23/18 129.3 kg  05/30/18 123.1 kg     Intake/Output Summary (Last 24 hours) at 02/13/2022 1015 Last data filed at 02/13/2022 1011 Gross per 24 hour  Intake --  Output 4025 ml  Net -4025 ml     Physical Exam  Awake Alert, No new F.N deficits, Normal affect [place].AT,PERRAL Supple Neck, No JVD,   Symmetrical Chest wall movement, Good air movement bilaterally, few rales RRR,No Gallops, Rubs or new Murmurs,  +ve B.Sounds, Abd Soft, No tenderness,   No Cyanosis, Clubbing or edema        Data Review:    CBC Recent Labs  Lab 02/09/22 0415 02/09/22 0542 02/10/22 0102 02/12/22 0151 02/13/22 0546  WBC 13.6*  --  10.3 11.9* 11.2*  HGB 13.4 11.9* 13.3 13.6 14.0  HCT 40.9 35.0* 40.8 41.8 43.2  PLT 380  --  353 451* 429*  MCV 89.5  --  89.9 89.9 89.8  MCH 29.3  --  29.3 29.2 29.1  MCHC 32.8  --  32.6 32.5 32.4  RDW 13.3  --  13.5 13.4 13.2  LYMPHSABS 1.1  --   --   --  0.6*  MONOABS 0.9  --   --   --  0.4  EOSABS 0.1  --   --   --  0.0  BASOSABS 0.1  --   --   --  0.0    Electrolytes Recent Labs  Lab 02/09/22 0415 02/09/22 0536 02/09/22 0542 02/10/22 0102 02/11/22 2120 02/12/22 0151 02/13/22 0546  NA 136  --  135 138  --  135 136  K 4.1  --  4.1 4.4  --  4.7 5.0  CL 102  --   --  103  --  100 96*  CO2 25  --   --  26  --  27 32  GLUCOSE 210*  --   --  165* 452* 205* 231*  BUN 19  --   --  26*  --  49* 47*  CREATININE 1.27*  --   --  1.19  --  1.29* 1.34*  CALCIUM 8.8*  --   --  8.6*  --  8.8* 9.1  AST 62*  --   --  34  --   --  20  ALT 64*  --   --  49*  --   --  34  ALKPHOS 140*  --   --  126  --   --  109  BILITOT 0.6  --   --  0.8  --   --  0.5  ALBUMIN 2.2*  --   --  2.0*  --   --  2.3*  MG  --   --   --  2.2  --   --  2.3  PROCALCITON 0.33  --   --   --   --   --   --  LATICACIDVEN 1.3 1.0  --   --   --   --   --   INR 1.2  --   --   --   --   --   --   HGBA1C  --   --   --  9.9*  --   --    --   BNP 717.4*  --   --   --   --   --  962.4*    ------------------------------------------------------------------------------------------------------------------ No results for input(s): CHOL, HDL, LDLCALC, TRIG, CHOLHDL, LDLDIRECT in the last 72 hours.  Lab Results  Component Value Date   HGBA1C 9.9 (H) 02/10/2022        Radiology Reports DG CHEST PORT 1 VIEW  Result Date: 02/10/2022 CLINICAL DATA:  Hypoxia. EXAM: PORTABLE CHEST 1 VIEW COMPARISON:  Chest CT yesterday at 7:48 a.m. FINDINGS: Moderate cardiomegaly. Central vessels are not well seen although were mildly prominent on CT and probably still are prominent. The mediastinal configuration is stable. Extensive bilateral patchy dense airspace disease continues to be seen with relative apical sparing. The lung opacities more confluent in the left lower lung field than yesterday but otherwise have not changed in the interval. There are bilateral minimal pleural effusions. No acute osseous abnormality. IMPRESSION: 1. Extensive airspace disease, worsened in the left lower zone today and otherwise unchanged. Findings may suggest pneumonia, edema or combination. 2. Cardiomegaly. Electronically Signed   By: Telford Nab M.D.   On: 02/10/2022 05:34   ECHOCARDIOGRAM COMPLETE BUBBLE STUDY  Result Date: 02/09/2022    ECHOCARDIOGRAM REPORT   Patient Name:   Storm Frisk Date of Exam: 02/09/2022 Medical Rec #:  [id number]              Height:       65.0 in Accession #:    [id number]             Weight:       285.1 lb Date of Birth:  Sep 20, 1980              BSA:          2.300 m Patient Age:    1 years               BP:           119/83 mmHg Patient Gender: M                      HR:           95 bpm. Exam Location:  Inpatient Procedure: 2D Echo, Cardiac Doppler, Color Doppler and Saline Contrast Bubble            Study Indications:    CHF  History:        Patient has prior history of Echocardiogram examinations, most                 recent  04/12/2018. Risk Factors:Hypertension.  Sonographer:    Jefferey Pica Referring Phys: [id number] Anthony  1. Left ventricular ejection fraction, by estimation, is 30 to 35%. The left ventricle has moderately decreased function. The left ventricle demonstrates global hypokinesis. Left ventricular diastolic parameters are indeterminate.  2. Right ventricular systolic function is normal. The right ventricular size is normal.  3. The mitral valve is normal in structure. Mild to moderate mitral valve regurgitation. No evidence of mitral stenosis.  4. The aortic valve is normal in structure. Aortic valve regurgitation is not  visualized. No aortic stenosis is present.  5. There is mild dilatation of the ascending aorta, measuring 38 mm.  6. The inferior vena cava is normal in size with greater than 50% respiratory variability, suggesting right atrial pressure of 3 mmHg. FINDINGS  Left Ventricle: Left ventricular ejection fraction, by estimation, is 30 to 35%. The left ventricle has moderately decreased function. The left ventricle demonstrates global hypokinesis. The left ventricular internal cavity size was normal in size. There is no left ventricular hypertrophy. Left ventricular diastolic parameters are indeterminate. Right Ventricle: The right ventricular size is normal. No increase in right ventricular wall thickness. Right ventricular systolic function is normal. Left Atrium: Left atrial size was normal in size. Right Atrium: Right atrial size was normal in size. Pericardium: There is no evidence of pericardial effusion. Mitral Valve: The mitral valve is normal in structure. Mild to moderate mitral valve regurgitation. No evidence of mitral valve stenosis. Tricuspid Valve: The tricuspid valve is normal in structure. Tricuspid valve regurgitation is not demonstrated. No evidence of tricuspid stenosis. Aortic Valve: The aortic valve is normal in structure. Aortic valve regurgitation is not  visualized. No aortic stenosis is present. Aortic valve peak gradient measures 8.0 mmHg. Pulmonic Valve: The pulmonic valve was normal in structure. Pulmonic valve regurgitation is not visualized. No evidence of pulmonic stenosis. Aorta: There is mild dilatation of the ascending aorta, measuring 38 mm. Venous: The inferior vena cava is normal in size with greater than 50% respiratory variability, suggesting right atrial pressure of 3 mmHg. IAS/Shunts: No atrial level shunt detected by color flow Doppler. Agitated saline contrast was given intravenously to evaluate for intracardiac shunting.  LEFT VENTRICLE PLAX 2D LVIDd:         6.10 cm      Diastology LVIDs:         5.00 cm      LV e' lateral: 6.68 cm/s LV PW:         1.80 cm LV IVS:        1.70 cm LVOT diam:     2.40 cm LV SV:         76 LV SV Index:   33 LVOT Area:     4.52 cm  LV Volumes (MOD) LV vol d, MOD A4C: 247.0 ml LV vol s, MOD A4C: 152.0 ml LV SV MOD A4C:     247.0 ml RIGHT VENTRICLE             IVC RV S prime:     10.20 cm/s  IVC diam: 2.70 cm TAPSE (M-mode): 1.9 cm LEFT ATRIUM              Index        RIGHT ATRIUM           Index LA diam:        4.80 cm  2.09 cm/m   RA Area:     22.20 cm LA Vol (A2C):   98.1 ml  42.66 ml/m  RA Volume:   67.30 ml  29.27 ml/m LA Vol (A4C):   131.0 ml 56.97 ml/m LA Biplane Vol: 124.0 ml 53.92 ml/m  AORTIC VALVE                 PULMONIC VALVE AV Area (Vmax): 4.09 cm     PV Vmax:       0.80 m/s AV Vmax:        141.50 cm/s  PV Peak grad:  2.6 mmHg AV Peak Grad:  8.0 mmHg LVOT Vmax:      128.00 cm/s LVOT Vmean:     74.000 cm/s LVOT VTI:       0.169 m  AORTA Ao Root diam: 3.50 cm Ao Asc diam:  3.80 cm MR Peak grad: 99.6 mmHg MR Vmax:      499.00 cm/s SHUNTS                           Systemic VTI:  0.17 m                           Systemic Diam: 2.40 cm Kardie Tobb DO Electronically signed by Berniece Salines DO Signature Date/Time: 02/09/2022/1:36:18 PM    Final

## 2022-02-13 NOTE — TOC Progression Note (Signed)
Transition of Care [facility]) - Progression Note    Patient Details  Name: Justin [name] MRN: [id number] Date of Birth: Jan 08, 1981  [facility] CM/SW Contact  Cyndi Bender, RN Phone Number: 02/13/2022, 2:56 PM  Clinical Narrative:     Spoke to patient regarding transition needs. Patient states he has a PCP but doesn't know the name of the clinic or the MD. Patient agreeable for RNCM to make an apt for new PCP. Request sent to CMA to make PCP apt. Discussed MATCH process and patient states he can afford the copay if needed. Brother can transport home once stable. Currently on 6L 02. TOC will follow for possible home 02 needs.  TOC will continue to follow for needs  Expected Discharge Plan: Home/Self Care Barriers to Discharge: Continued Medical Work up  Expected Discharge Plan and Services Expected Discharge Plan: Home/Self Care   Discharge Planning Services: [place] [facility], Follow-up appt scheduled, Medication Assistance   Living arrangements for the past 2 months: Single Family Home                                       Social Determinants of Health (SDOH) Interventions    Readmission Risk Interventions     View : No data to display.

## 2022-02-13 NOTE — Progress Notes (Signed)
Out of pocket expense for Justin [name] is $608.21, Justin [name] is $498.81.

## 2022-02-13 NOTE — Progress Notes (Addendum)
Progress Note  Patient Name: Justin [name] Date of Encounter: 02/13/2022  Primary Cardiologist:   Jodelle Red, MD   Subjective   Denies any chest pain or SOB.  Still has chronic cough that has been present for a week  Interpreter service utilized.   Inpatient Medications    Scheduled Meds:  azithromycin  500 mg Oral Daily   carvedilol  12.5 mg Oral BID WC   chlorhexidine  15 mL Mouth Rinse BID   Chlorhexidine Gluconate Cloth  6 each Topical Q0600   furosemide  40 mg Intravenous Q12H   heparin injection (subcutaneous)  5,000 Units Subcutaneous Q8H   insulin aspart  0-15 Units Subcutaneous TID WC   insulin aspart  0-5 Units Subcutaneous QHS   insulin aspart  2 Units Subcutaneous TID WC   insulin glargine-yfgn  18 Units Subcutaneous Daily   irbesartan  37.5 mg Oral Daily   mouth rinse  15 mL Mouth Rinse q12n4p   melatonin  3 mg Oral QHS   nystatin   Topical BID   Continuous Infusions:  PRN Meds: acetaminophen, benzonatate, ipratropium-albuterol   Vital Signs    Vitals:   02/12/22 2020 02/13/22 0000 02/13/22 0333 02/13/22 0729  BP: 125/87 (!) 140/128 (!) 162/121 (!) 161/112  Pulse: 80 78 83 86  Resp: 20  18 20   Temp: 98 F (36.7 C) 97.8 F (36.6 C) 98 F (36.7 C) 98 F (36.7 C)  TempSrc: Oral Oral Oral Oral  SpO2: 97% 96% 95% 96%  Weight:      Height:        Intake/Output Summary (Last 24 hours) at 02/13/2022 0932 Last data filed at 02/13/2022 0600 Gross per 24 hour  Intake --  Output 3450 ml  Net -3450 ml    Filed Weights   02/09/22 0529  Weight: 129.3 kg    Telemetry    NSR - Personally Reviewed  ECG    NA - Personally Reviewed  Physical Exam   GEN: Well nourished, well developed in no acute distress HEENT: Normal NECK: No JVD; No carotid bruits LYMPHATICS: No lymphadenopathy CARDIAC:RRR, no murmurs, rubs, gallops RESPIRATORY:  Clear to auscultation without rales, wheezing or rhonchi  ABDOMEN: Soft, non-tender,  non-distended MUSCULOSKELETAL:  No edema; No deformity  SKIN: Warm and dry NEUROLOGIC:  Alert and oriented x 3 PSYCHIATRIC:  Normal affect   Labs    Chemistry Recent Labs  Lab 02/09/22 0415 02/09/22 0542 02/10/22 0102 02/11/22 2120 02/12/22 0151 02/13/22 0546  NA 136   < > 138  --  135 136  K 4.1   < > 4.4  --  4.7 5.0  CL 102  --  103  --  100 96*  CO2 25  --  26  --  27 32  GLUCOSE 210*  --  165* 452* 205* 231*  BUN 19  --  26*  --  49* 47*  CREATININE 1.27*  --  1.19  --  1.29* 1.34*  CALCIUM 8.8*  --  8.6*  --  8.8* 9.1  PROT 6.6  --  6.4*  --   --  6.4*  ALBUMIN 2.2*  --  2.0*  --   --  2.3*  AST 62*  --  34  --   --  20  ALT 64*  --  49*  --   --  34  ALKPHOS 140*  --  126  --   --  109  BILITOT 0.6  --  0.8  --   --  0.5  GFRNONAA >60  --  >60  --  >60 >60  ANIONGAP 9  --  9  --  8 8   < > = values in this interval not displayed.      Hematology Recent Labs  Lab 02/10/22 0102 02/12/22 0151 02/13/22 0546  WBC 10.3 11.9* 11.2*  RBC 4.54 4.65 4.81  HGB 13.3 13.6 14.0  HCT 40.8 41.8 43.2  MCV 89.9 89.9 89.8  MCH 29.3 29.2 29.1  MCHC 32.6 32.5 32.4  RDW 13.5 13.4 13.2  PLT 353 451* 429*     Cardiac EnzymesNo results for input(s): TROPONINI in the last 168 hours. No results for input(s): TROPIPOC in the last 168 hours.   BNP Recent Labs  Lab 02/09/22 0415 02/13/22 0546  BNP 717.4* 962.4*      DDimer No results for input(s): DDIMER in the last 168 hours.   Radiology    No results found.  Cardiac Studies   ECHO:     1. Left ventricular ejection fraction, by estimation, is 30 to 35%. The  left ventricle has moderately decreased function. The left ventricle  demonstrates global hypokinesis. Left ventricular diastolic parameters are  indeterminate.   2. Right ventricular systolic function is normal. The right ventricular  size is normal.   3. The mitral valve is normal in structure. Mild to moderate mitral valve  regurgitation. No evidence  of mitral stenosis.   4. The aortic valve is normal in structure. Aortic valve regurgitation is  not visualized. No aortic stenosis is present.   5. There is mild dilatation of the ascending aorta, measuring 38 mm.   6. The inferior vena cava is normal in size with greater than 50%  respiratory variability, suggesting right atrial pressure of 3 mmHg.   Patient Profile     41 y.o. male with a hx of systolic HF who is being seen today for the evaluation of SOB at the request of Dr. Delton Coombes.  Assessment & Plan    Acute on chronic systolic heart failure:    -LV dysfunction felt to be hypertensive in etiology -cath 2017 with normal coronary arteries -good diuresis >> he put out 3.9L yesterday and is net meg 10.2 L since admit -weights not recorded -Cannot afford Entresto  -continue Irbesartan 37.5mg  daily, Carvedilol 12.5mg  BID  -per case management Marcelline Deist is $608.21, London Pepper is $498.81 so unlikely that he will be able to afford these drugs -he has not been seen in the office since 2019 at which time his dry weight was around 285lb (129kg). He was the same weight on this admission -it is very difficult to assess volume status in setting of morbid obesity -BNP increased from 717>>962 today -continue IV Lasix for now -SCr slightly up today and will need to follow closely with diuresis -check Cxray given cough  Hypertension:   -BP controlled on exam today -continue Irbesartan 37.5mg  daily and Carvedilol 12.5mg  BID   Diabetes mellitus:   -A1C is 9.9.   -Certainly Marcelline Deist would of benefit given CHF but we will have to try to figure out cost.   CKD class II:  -Creat up slightly (1.27>1.34 today) -continue to follow   Mitral regurgitation:  -follow with annual echo  I have spent a total of 35 minutes with patient reviewing 2D echo , telemetry, EKGs, labs and examining patient as well as establishing an assessment and plan that was discussed with the patient.  > 50% of  time was spent  in direct patient care.     For questions or updates, please contact [facility] Please consult [email or web address] for contact info under Cardiology/STEMI.   Signed, Armanda Magic, MD  02/13/2022, 9:32 AM

## 2022-02-14 ENCOUNTER — Other Ambulatory Visit (HOSPITAL_COMMUNITY): Payer: Self-pay

## 2022-02-14 ENCOUNTER — Inpatient Hospital Stay (HOSPITAL_COMMUNITY): Payer: Self-pay

## 2022-02-14 DIAGNOSIS — N179 Acute kidney failure, unspecified: Secondary | ICD-10-CM

## 2022-02-14 DIAGNOSIS — I5023 Acute on chronic systolic (congestive) heart failure: Secondary | ICD-10-CM

## 2022-02-14 DIAGNOSIS — I5021 Acute systolic (congestive) heart failure: Secondary | ICD-10-CM

## 2022-02-14 DIAGNOSIS — I5031 Acute diastolic (congestive) heart failure: Secondary | ICD-10-CM

## 2022-02-14 LAB — CBC WITH DIFFERENTIAL/PLATELET
Abs Immature Granulocytes: 0.07 10*3/uL (ref 0.00–0.07)
Basophils Absolute: 0 10*3/uL (ref 0.0–0.1)
Basophils Relative: 0 %
Eosinophils Absolute: 0.1 10*3/uL (ref 0.0–0.5)
Eosinophils Relative: 1 %
HCT: 44.5 % (ref 39.0–52.0)
Hemoglobin: 14 g/dL (ref 13.0–17.0)
Immature Granulocytes: 1 %
Lymphocytes Relative: 13 %
Lymphs Abs: 1.4 10*3/uL (ref 0.7–4.0)
MCH: 28.6 pg (ref 26.0–34.0)
MCHC: 31.5 g/dL (ref 30.0–36.0)
MCV: 90.8 fL (ref 80.0–100.0)
Monocytes Absolute: 1 10*3/uL (ref 0.1–1.0)
Monocytes Relative: 8 %
Neutro Abs: 8.9 10*3/uL — ABNORMAL HIGH (ref 1.7–7.7)
Neutrophils Relative %: 77 %
Platelets: 474 10*3/uL — ABNORMAL HIGH (ref 150–400)
RBC: 4.9 MIL/uL (ref 4.22–5.81)
RDW: 13.2 % (ref 11.5–15.5)
WBC: 11.6 10*3/uL — ABNORMAL HIGH (ref 4.0–10.5)
nRBC: 0 % (ref 0.0–0.2)

## 2022-02-14 LAB — COMPREHENSIVE METABOLIC PANEL
ALT: 32 U/L (ref 0–44)
AST: 22 U/L (ref 15–41)
Albumin: 2.2 g/dL — ABNORMAL LOW (ref 3.5–5.0)
Alkaline Phosphatase: 96 U/L (ref 38–126)
Anion gap: 11 (ref 5–15)
BUN: 45 mg/dL — ABNORMAL HIGH (ref 6–20)
CO2: 32 mmol/L (ref 22–32)
Calcium: 9.2 mg/dL (ref 8.9–10.3)
Chloride: 96 mmol/L — ABNORMAL LOW (ref 98–111)
Creatinine, Ser: 1.16 mg/dL (ref 0.61–1.24)
GFR, Estimated: >60 mL/min (ref >60)
Glucose, Bld: 104 mg/dL — ABNORMAL HIGH (ref 70–99)
Potassium: 4.4 mmol/L (ref 3.5–5.1)
Sodium: 139 mmol/L (ref 135–145)
Total Bilirubin: 0.6 mg/dL (ref 0.3–1.2)
Total Protein: 6.2 g/dL — ABNORMAL LOW (ref 6.5–8.1)

## 2022-02-14 LAB — GLUCOSE, CAPILLARY
Glucose-Capillary: 119 mg/dL — ABNORMAL HIGH (ref 70–99)
Glucose-Capillary: 179 mg/dL — ABNORMAL HIGH (ref 70–99)
Glucose-Capillary: 221 mg/dL — ABNORMAL HIGH (ref 70–99)
Glucose-Capillary: 183 mg/dL — ABNORMAL HIGH (ref 70–99)

## 2022-02-14 LAB — CULTURE, BLOOD (ROUTINE X 2)
Culture: NO GROWTH
Culture: NO GROWTH
Special Requests: ADEQUATE

## 2022-02-14 LAB — MAGNESIUM: Magnesium: 2.1 mg/dL (ref 1.7–2.4)

## 2022-02-14 LAB — C-REACTIVE PROTEIN: CRP: 4.3 mg/dL — ABNORMAL HIGH (ref <1.0)

## 2022-02-14 MED ORDER — DAPAGLIFLOZIN PROPANEDIOL 10 MG PO TABS
10.0000 mg | ORAL_TABLET | Freq: Every day | ORAL | Status: DC
  Administered 2022-02-14 – 2022-02-17 (×3): 10 mg via ORAL
  Filled 2022-02-14 (×4): qty 1

## 2022-02-14 MED ORDER — METFORMIN HCL 500 MG PO TABS
500.0000 mg | ORAL_TABLET | Freq: Two times a day (BID) | ORAL | Status: DC
  Administered 2022-02-14 – 2022-02-16 (×4): 500 mg via ORAL
  Filled 2022-02-14 (×4): qty 1

## 2022-02-14 MED ORDER — IRBESARTAN 75 MG PO TABS
37.5000 mg | ORAL_TABLET | Freq: Once | ORAL | Status: AC
  Filled 2022-02-14: qty 0.5

## 2022-02-14 MED ORDER — IRBESARTAN 75 MG PO TABS
75.0000 mg | ORAL_TABLET | Freq: Every day | ORAL | Status: DC
  Administered 2022-02-15: 75 mg via ORAL
  Filled 2022-02-14 (×2): qty 1

## 2022-02-14 NOTE — Progress Notes (Addendum)
Inpatient Diabetes Program Recommendations  AACE/ADA: New Consensus Statement on Inpatient Glycemic Control (2015)  Target Ranges:  Prepandial:   less than 140 mg/dL      Peak postprandial:   less than 180 mg/dL (1-2 hours)      Critically ill patients:  140 - 180 mg/dL   Lab Results  Component Value Date   GLUCAP 179 (H) 02/14/2022   HGBA1C 9.9 (H) 02/10/2022    Review of Glycemic Control  Latest Reference Range & Units 02/14/22 07:46 02/14/22 11:43  Glucose-Capillary 70 - 99 mg/dL 161 (H) 096 (H)   Diabetes history: DM 2-New diagnosis Outpatient Diabetes medications:  None Current orders for Inpatient glycemic control:  Semglee 22 units q HS Novolog 2 units tid with meals Novolog 0-15 units tid with meals and HS  Inpatient Diabetes Program Recommendations:    Visited with patient and pharmacist in room assisting patient with filling out form for assistance with [place].  Patient is newly diagnosed with DM.  Discussed new diagnosis with patient on 02/13/22 and lifestyle modifications.   Recommend the addition of Metformin 500 mg bid in addition to [place] for easier regimen and less risk for hypoglycemia.  Will f/u with PCP.  Also patient is eligible for MATCH and can get medications for [facility] in the future.  Gave patient RELI-ON glucose meter as well for monitoring (showed him "picture instructions" on how to use).   Will follow.   Thanks,  Beryl Meager, RN, BC-ADM Inpatient Diabetes Coordinator Pager [phone]  (8a-5p)

## 2022-02-14 NOTE — Progress Notes (Signed)
Heart Failure Stewardship Pharmacist Progress Note   PCP: Patient, No Pcp Per (Inactive) PCP-Cardiologist: Jodelle Red, MD   HPI:  41 yo spanish speaking male with pmh LVH, HTN, hypertriglyceridemia presented after feeling sob. Oxygen sats found to be 70% with pulmonary edema on cxr. Pt did endorse a recent dx of pna, although unclear if he completed a course of abx.   02/13/2022 Chest X-ray showed stable to minimal improvement in extensive bilateral airspace process concerning for pneumonia or acute alveloar edema.   Per med rec patient has not been taking any of his home medications. Upon asking the patient he told me that he stopped taking them because he felt fine. I however, also suspect that cost is an issue.   Echo this admission (02/09/2022) with EF 30-35%, RV normal, mild MR. This is a new drop as 2019 echo with EF of 55-60%.   Current HF Medications: Diuretic: furosemide 40 mg IV BID Beta Blocker: carvedilol 12.5 mg BID ACE/ARB/ARNI: irbesartan 75 mg daily  Prior to admission HF Medications: Diuretic: furosemide 40 mg daily Beta blocker: carvedilol 25 mg BID ACE/ARB/ARNI: lisinopril 20 mg BID Aldosterone Antagonist: spironolactone 50 mg daily SGLT2i: none Per med rec patient not taking any medications  Pertinent Lab Values: Serum creatinine 1.16, BUN 45, Potassium 4.4, Sodium 139, BNP 962, Magnesium 2.1, A1c 9.9%   Vital Signs: Weight: not documented since admit (admission weight: 285 lbs) Dry weight appears to be ~285 lbs per 2019 office visit Blood pressure: 133-145/105-115  Heart rate: 85  I/O: -4.2 L yesterday; net -14.7 L  Medication Assistance / Insurance Benefits Check: Does the patient have prescription insurance?  No Type of insurance plan: N/A  Does the patient qualify for medication assistance through manufacturers or grants?   Yes Eligible grants and/or patient assistance programs: [employer] Medication assistance applications in progress: pending   Medication assistance applications approved: none Approved medication assistance renewals will be completed by: pending  Outpatient Pharmacy:  Prior to admission outpatient pharmacy: [facility] Is the patient willing to use [facility] TOC pharmacy at discharge? Yes Is the patient willing to transition their outpatient pharmacy to utilize a [facility] outpatient pharmacy?   Pending   Assessment: 1. Acute on chronic systolic CHF (LVEF 30-35%), due to HTN. NYHA class II symptoms. -Patient still volume overloaded with BNP that increased from 717 to 962. He is diuresising well with current regimen. Continue furosemide 40 mg IV BID -HR controlled, continue carvedilol 12.5 mg BID, can further titrate as patient becomes more evolemic -BP elevated, agree with increase in irbesartan to 75 mg daily. Patient would be a good Entresto candidate given elevated BP if able to get him coverage.  -Patient not currently on MRA. K 4.4, Scr at baseline. Start spironolactone 25 mg daily.  -A1c elevated at 9.9%, patient would also benefit for SGLT2i.  -HF TOC team to speak with him and attempt to get RX insurance. 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   Plan: 1) Medication changes recommended at this time: -Continue furosemide 40 mg IV BID -Continue carvedilol 12.5 mg BID -Continue irbesartan 75 mg daily -Start spironolactone 25 mg daily -Start Farxiga 10 mg daily  2) Patient assistance: -patient assistance for [employer] completed 02/14/2022 -plan for Match at discharge  -can use 30 day free card for [place] after Match if patient assistance application is still pending -Sherryll Burger is not an option as he is not a citizen and tax returns are required by the company for patient  assistance -DM coordinator asked me about insulin options, I suggested community health and wellness  3)  Education  - To be completed prior to discharge  Drake Leach, PharmD, [facility] PGY2 Cardiology Pharmacy  Resident

## 2022-02-14 NOTE — Plan of Care (Signed)
Nutrition Education Note  RD consulted for nutrition education regarding CHF and diabetes.   Lab Results  Component Value Date   HGBA1C 9.9 (H) 02/10/2022     RD provided "Low Sodium Nutrition Therapy" and "Carbohydrate Counting for People with Diabetes" handout from the Academy of Nutrition and Dietetics in Spanish. Reviewed patient's dietary recall. 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 He denies changes to his PO intake and endorses having a continually good appetite. Provided examples on ways to decrease sodium intake in diet. Discouraged intake of processed foods and use of salt shaker. Encouraged fresh fruits and vegetables as well as whole grain sources of carbohydrates to maximize fiber intake.   RD discussed why it is important for patient to adhere to diet recommendations, and emphasized the role of fluids, foods to avoid, and importance of weighing self daily.   Discussed different food groups and their effects on blood sugar, emphasizing carbohydrate-containing foods. Provided list of carbohydrates and recommended serving sizes of common foods.  Discussed importance of controlled and consistent carbohydrate intake throughout the day. Provided examples of ways to balance meals/snacks and encouraged intake of high-fiber, whole grain complex carbohydrates. Teach back method used.  Expect fair compliance.  Body mass index is 47.44 kg/m. Pt meets criteria for morbid obesity based on current BMI.  Current diet order is Carb Modified, patient is consuming approximately 100% of meals at this time. Labs and medications reviewed. No further nutrition interventions warranted at this time. RD contact information provided. If additional nutrition issues arise, please re-consult RD.   Drusilla Kanner, RDN, LDN Clinical Nutrition

## 2022-02-14 NOTE — Progress Notes (Signed)
Progress Note  Patient Name: Justin [name] Date of Encounter: 02/14/2022  [facility] Cardiologist: Buford Dresser, MD   Subjective   Interpreter service utilized   Patient reports that his breathing has improved with diuresis, is able to lay flat and walk around room without SOB. Continues to need supplemental oxygen and has the same cough that he has had all week. Reports some left sided chest tightness, feels like he can't fully expand his lungs. Tightness is worse with deep inhalation, and his left chest wall is tender to palpation. Some ankle swelling.   Inpatient Medications    Scheduled Meds:  azithromycin  500 mg Oral Daily   carvedilol  12.5 mg Oral BID WC   chlorhexidine  15 mL Mouth Rinse BID   Chlorhexidine Gluconate Cloth  6 each Topical Q0600   furosemide  40 mg Intravenous Q12H   heparin injection (subcutaneous)  5,000 Units Subcutaneous Q8H   insulin aspart  0-15 Units Subcutaneous TID WC   insulin aspart  0-5 Units Subcutaneous QHS   insulin aspart  2 Units Subcutaneous TID WC   insulin glargine-yfgn  22 Units Subcutaneous Daily   irbesartan  37.5 mg Oral Daily   mouth rinse  15 mL Mouth Rinse q12n4p   melatonin  3 mg Oral QHS   nystatin   Topical BID   Continuous Infusions:  PRN Meds: acetaminophen, benzonatate, ipratropium-albuterol   Vital Signs    Vitals:   02/13/22 1940 02/14/22 0019 02/14/22 0347 02/14/22 0746  BP: (!) 155/96 (!) 133/109 (!) 136/107 (!) 148/116  Pulse: 81 78 74 95  Resp: (!) 21 18 (!) 23 20  Temp: 98 F (36.7 C) 98.6 F (37 C) 98.5 F (36.9 C) 98.2 F (36.8 C)  TempSrc: Oral Oral Oral Oral  SpO2: 97% (!) 87% 98% (!) 89%  Weight:      Height:        Intake/Output Summary (Last 24 hours) at 02/14/2022 0753 Last data filed at 02/14/2022 0708 Gross per 24 hour  Intake 240 ml  Output 4675 ml  Net -4435 ml      02/09/2022    5:29 AM 08/23/2018    9:36 AM 05/30/2018    9:19 AM  Last 3 Weights  Weight  (lbs) 285 lb 0.9 oz 285 lb 271 lb 6.4 oz  Weight (kg) 129.3 kg 129.275 kg 123.106 kg      Telemetry    Sinus rhythm  - Personally Reviewed  ECG    No new tracings since 6/3 - Personally Reviewed  Physical Exam   GEN: No acute distress. Laying comfortably in the bed  wearing nasal cannula  Neck: No JVD Cardiac: RRR, no murmurs, rubs, or gallops. Radial pulses 2+bilaterally  Respiratory: Occasional expiratory wheezes, inspiratory crackles in bilateral lung bases . Left chest wall mildly tender to palpation  GI: Soft, nontender, non-distended  MS: Trace edema in BLE  Neuro:  Nonfocal  Psych: Normal affect   Labs    High Sensitivity Troponin:  No results for input(s): TROPONINIHS in the last 720 hours.   Chemistry Recent Labs  Lab 02/10/22 0102 02/11/22 2120 02/12/22 0151 02/13/22 0546 02/14/22 0115  NA 138  --  135 136 139  K 4.4  --  4.7 5.0 4.4  CL 103  --  100 96* 96*  CO2 26  --  27 32 32  GLUCOSE 165*   < > 205* 231* 104*  BUN 26*  --  49* 47* 45*  CREATININE 1.19  --  1.29* 1.34* 1.16  CALCIUM 8.6*  --  8.8* 9.1 9.2  MG 2.2  --   --  2.3 2.1  PROT 6.4*  --   --  6.4* 6.2*  ALBUMIN 2.0*  --   --  2.3* 2.2*  AST 34  --   --  20 22  ALT 49*  --   --  34 32  ALKPHOS 126  --   --  109 96  BILITOT 0.8  --   --  0.5 0.6  GFRNONAA >60  --  >60 >60 >60  ANIONGAP 9  --  8 8 11    < > = values in this interval not displayed.    Lipids No results for input(s): CHOL, TRIG, HDL, LABVLDL, LDLCALC, CHOLHDL in the last 168 hours.  Hematology Recent Labs  Lab 02/12/22 0151 02/13/22 0546 02/14/22 0115  WBC 11.9* 11.2* 11.6*  RBC 4.65 4.81 4.90  HGB 13.6 14.0 14.0  HCT 41.8 43.2 44.5  MCV 89.9 89.8 90.8  MCH 29.2 29.1 28.6  MCHC 32.5 32.4 31.5  RDW 13.4 13.2 13.2  PLT 451* 429* 474*   Thyroid No results for input(s): TSH, FREET4 in the last 168 hours.  BNP Recent Labs  Lab 02/09/22 0415 02/13/22 0546  BNP 717.4* 962.4*    DDimer No results for input(s):  DDIMER in the last 168 hours.   Radiology    DG Chest 2 View  Result Date: 02/13/2022 CLINICAL DATA:  Cough, hypertension EXAM: CHEST - 2 VIEW COMPARISON:  02/10/2022 FINDINGS: Heart remains enlarged with similar to minimal improvement in the severe bilateral airspace disease nonspecific for bilateral pneumonia, or alveolar edema. No enlarging effusion or pneumothorax. Trachea midline. No acute osseous finding. IMPRESSION: Stable to minimal improvement in the extensive bilateral airspace process concerning for pneumonia or acute alveolar edema. Stable cardiomegaly Electronically Signed   By: Jerilynn Mages.  Shick M.D.   On: 02/13/2022 10:48    Cardiac Studies   Echocardiogram 02/09/22    1. Left ventricular ejection fraction, by estimation, is 30 to 35%. The  left ventricle has moderately decreased function. The left ventricle  demonstrates global hypokinesis. Left ventricular diastolic parameters are  indeterminate.   2. Right ventricular systolic function is normal. The right ventricular  size is normal.   3. The mitral valve is normal in structure. Mild to moderate mitral valve  regurgitation. No evidence of mitral stenosis.   4. The aortic valve is normal in structure. Aortic valve regurgitation is  not visualized. No aortic stenosis is present.   5. There is mild dilatation of the ascending aorta, measuring 38 mm.   6. The inferior vena cava is normal in size with greater than 50%  respiratory variability, suggesting right atrial pressure of 3 mmHg.   Patient Profile     41 y.o. male with a history of systolic CHF who was seen for the evaluation of SOB   Assessment & Plan    Acute on chronic systolic heart failure:    - Echocardiogram this admission showed EF 30-35%  (down from EF 55-60% in 2019)  - LV dysfunction felt to be hypertensive in etiology, cath 2017 with normal coronary arteries.  - Has been on lasix 40 mg IV BID this admission>> he put out 3.9L urine yesterday and is net -14.7 L  since admit. Breathing improving with diuresis  -Cannot afford Entresto  -continue Irbesartan 37.5mg  daily, Carvedilol 12.5mg  BID  -per case management, Wilder Glade is $  608.21, Vania Rea is $498.81 so it is unlikely that he will be able to afford these drugs -he has not been seen in the office since 2019 at which time his dry weight was around 285lb (129kg). He was the same weight on this admission -it is very difficult to assess volume status in setting of morbid obesity -BNP increased from 717 on admission to 962.4 yesterday  -continue IV Lasix for now  -SCr slightly up  yesterday (up to 1.34), however down to 1.16 today. Continue to follow renal function closely during diuresis. OK to continue ARB   - Patient has continued to complain of cough-- CXR repeated yesterday and showed stable to minimal improvement in extensive bilateral airspace process concerning for pneumonia  - Given patient's morbid obesity which makes it difficult to assess volume status, continued cough/hypoxia after good diuresis, may need RHC this admission. However, I suspect his cough and sob is in part due to possible PNA, follow to see if symptoms improve with antibiotics. Will discus with MD   Questionable atypical pneumonia  - Azithromycin per primary team    Hypertension:   -BP controlled on exam today -continue Irbesartan 37.5mg  daily and Carvedilol 12.5mg  BID   Diabetes mellitus:   -[id number] is 9.9.   -Certainly Wilder Glade would of benefit given CHF but we will have to try to figure out cost.   CKD class II:  -Creat improved (1.34>>1.16 today)  -continue to follow during diuresis    Mitral regurgitation:  -follow with annual echo      For questions or updates, please contact [facility] Please consult [email or web address] for contact info under        Signed, Margie Billet, PA-C  02/14/2022, 7:53 AM

## 2022-02-14 NOTE — Progress Notes (Addendum)
Progress Note  Patient Name: Justin [name] Date of Encounter: 02/14/2022  Primary Cardiologist:   Buford Dresser, MD   Subjective   Denies any chest pain or shortness of breath.  Chest x-ray done for chronic cough yesterday showed stable to minimal improvement in extensive bilateral airspace process concerning for pneumonia or acute alveolar edema. Interpreter service utilized.   Inpatient Medications    Scheduled Meds:  azithromycin  500 mg Oral Daily   carvedilol  12.5 mg Oral BID WC   chlorhexidine  15 mL Mouth Rinse BID   Chlorhexidine Gluconate Cloth  6 each Topical Q0600   furosemide  40 mg Intravenous Q12H   heparin injection (subcutaneous)  5,000 Units Subcutaneous Q8H   insulin aspart  0-15 Units Subcutaneous TID WC   insulin aspart  0-5 Units Subcutaneous QHS   insulin aspart  2 Units Subcutaneous TID WC   insulin glargine-yfgn  22 Units Subcutaneous Daily   irbesartan  37.5 mg Oral Daily   mouth rinse  15 mL Mouth Rinse q12n4p   melatonin  3 mg Oral QHS   nystatin   Topical BID   Continuous Infusions:  PRN Meds: acetaminophen, benzonatate, ipratropium-albuterol   Vital Signs    Vitals:   02/13/22 1940 02/14/22 0019 02/14/22 0347 02/14/22 0746  BP: (!) 155/96 (!) 133/109 (!) 136/107 (!) 148/116  Pulse: 81 78 74 95  Resp: (!) 21 18 (!) 23 20  Temp: 98 F (36.7 C) 98.6 F (37 C) 98.5 F (36.9 C) 98.2 F (36.8 C)  TempSrc: Oral Oral Oral Oral  SpO2: 97% (!) 87% 98% (!) 89%  Weight:      Height:        Intake/Output Summary (Last 24 hours) at 02/14/2022 0856 Last data filed at 02/14/2022 [id number] Gross per 24 hour  Intake 240 ml  Output 4675 ml  Net -4435 ml    Filed Weights   02/09/22 0529  Weight: 129.3 kg    Telemetry    Normal sinus rhythm- Personally Reviewed  ECG    NA - Personally Reviewed  Physical Exam   GEN: Well nourished, well developed in no acute distress HEENT: Normal NECK: No JVD; No carotid  bruits LYMPHATICS: No lymphadenopathy CARDIAC:RRR, no murmurs, rubs, gallops RESPIRATORY:  Clear to auscultation without rales, wheezing or rhonchi  ABDOMEN: Soft, non-tender, non-distended MUSCULOSKELETAL:  No edema; No deformity  SKIN: Warm and dry NEUROLOGIC:  Alert and oriented x 3 PSYCHIATRIC:  Normal affect   Labs    Chemistry Recent Labs  Lab 02/10/22 0102 02/11/22 2120 02/12/22 0151 02/13/22 0546 02/14/22 0115  NA 138  --  135 136 139  K 4.4  --  4.7 5.0 4.4  CL 103  --  100 96* 96*  CO2 26  --  27 32 32  GLUCOSE 165*   < > 205* 231* 104*  BUN 26*  --  49* 47* 45*  CREATININE 1.19  --  1.29* 1.34* 1.16  CALCIUM 8.6*  --  8.8* 9.1 9.2  PROT 6.4*  --   --  6.4* 6.2*  ALBUMIN 2.0*  --   --  2.3* 2.2*  AST 34  --   --  20 22  ALT 49*  --   --  34 32  ALKPHOS 126  --   --  109 96  BILITOT 0.8  --   --  0.5 0.6  GFRNONAA >60  --  >60 >60 >60  ANIONGAP 9  --  8 8 11    < > = values in this interval not displayed.      Hematology Recent Labs  Lab 02/12/22 0151 02/13/22 0546 02/14/22 0115  WBC 11.9* 11.2* 11.6*  RBC 4.65 4.81 4.90  HGB 13.6 14.0 14.0  HCT 41.8 43.2 44.5  MCV 89.9 89.8 90.8  MCH 29.2 29.1 28.6  MCHC 32.5 32.4 31.5  RDW 13.4 13.2 13.2  PLT 451* 429* 474*     Cardiac EnzymesNo results for input(s): TROPONINI in the last 168 hours. No results for input(s): TROPIPOC in the last 168 hours.   BNP Recent Labs  Lab 02/09/22 0415 02/13/22 0546  BNP 717.4* 962.4*      DDimer No results for input(s): DDIMER in the last 168 hours.   Radiology    DG Chest 2 View  Result Date: 02/13/2022 CLINICAL DATA:  Cough, hypertension EXAM: CHEST - 2 VIEW COMPARISON:  02/10/2022 FINDINGS: Heart remains enlarged with similar to minimal improvement in the severe bilateral airspace disease nonspecific for bilateral pneumonia, or alveolar edema. No enlarging effusion or pneumothorax. Trachea midline. No acute osseous finding. IMPRESSION: Stable to minimal  improvement in the extensive bilateral airspace process concerning for pneumonia or acute alveolar edema. Stable cardiomegaly Electronically Signed   By: Jerilynn Mages.  Shick M.D.   On: 02/13/2022 10:48    Cardiac Studies   ECHO:     1. Left ventricular ejection fraction, by estimation, is 30 to 35%. The  left ventricle has moderately decreased function. The left ventricle  demonstrates global hypokinesis. Left ventricular diastolic parameters are  indeterminate.   2. Right ventricular systolic function is normal. The right ventricular  size is normal.   3. The mitral valve is normal in structure. Mild to moderate mitral valve  regurgitation. No evidence of mitral stenosis.   4. The aortic valve is normal in structure. Aortic valve regurgitation is  not visualized. No aortic stenosis is present.   5. There is mild dilatation of the ascending aorta, measuring 38 mm.   6. The inferior vena cava is normal in size with greater than 50%  respiratory variability, suggesting right atrial pressure of 3 mmHg.   Patient Profile     41 y.o. male with a hx of systolic HF who is being seen today for the evaluation of SOB at the request of Dr. Lamonte Sakai.  Assessment & Plan    Acute on chronic systolic heart failure:    -LV dysfunction felt to be hypertensive in etiology -cath 2017 with normal coronary arteries -He continues to diurese well>> he put out 3.87 L yesterday and is net -14.7 L since admit -weights not recorded -Cannot afford Entresto  -BP is elevated today so will increase irbesartan to 75 mg daily and continue carvedilol 12.5 mg twice daily  -per case management Wilder Glade is $608.21, Vania Rea is $498.81 so unlikely that he will be able to afford these drugs -he has not been seen in the office since 2019 at which time his dry weight was around 285lb (129kg). He was the same weight on this admission -it is very difficult to assess volume status in setting of morbid obesity -BNP increased from  717>>962 yesterday consistent with ongoing volume overload -continue IV Lasix 40 mg twice daily for now since he is having good diuresis -SCr improved from 1.34-1.16 today with diuresis and potassium 4.4  -Chest x-ray consistent with ongoing CHF  Hypertension:   -BP elevated on exam today -Increase irbesartan to 75 mg daily and continue carvedilol  12.5 mg twice daily    Diabetes mellitus:   -[id number] is 9.9.   -Certainly Wilder Glade would of benefit given CHF but will likely be cost prohibitive   CKD class II:  -Creat improved today (1.27>1.34>1.16 today) -continue to follow   Mitral regurgitation:  -follow with annual echo  I have spent a total of 35 minutes with patient reviewing 2D echo , telemetry, EKGs, labs and examining patient as well as establishing an assessment and plan that was discussed with the patient.  > 50% of time was spent in direct patient care.     For questions or updates, please contact [place] Please consult [email or web address] for contact info under Cardiology/STEMI.   Signed, Fransico Him, MD  02/14/2022, 8:56 AM

## 2022-02-14 NOTE — Progress Notes (Signed)
PROGRESS NOTE                                                                                                                                                                                                             Patient Demographics:    Justin [name], is a 41 y.o. male, DOB - [birth year], CT:[id number]  Outpatient Primary MD for the patient is Patient, No Pcp Per (Inactive)    LOS - 5  Admit date - 02/09/2022    Chief Complaint  Patient presents with   Shortness of Breath       Brief Narrative (HPI from H&P)   40 year old man (Spanish-speaking) who presented to [facility] 6/1 for SOB. PMHx significant for HTN, LVH, hypertriglyceridemia. Oxygen sats found to be 70% with pulmonary edema on CXR initially required BiPAP was admitted by the pulmonary critical care team, was also seen by cardiology, he was stabilized and transferred to my service on 02/09/2022.   Subjective:   Patient in bed, appears comfortable, denies any headache, no fever, no chest pain or pressure, no shortness of breath , no abdominal pain. No new focal weakness.   Assessment  & Plan :    Acute hypoxic respiratory failure due to pulmonary edema and questionable atypical pneumonia. he was initially on BiPAP, has been diuresed and now on 2 L nasal cannula, cardiology on board, echo noted with EF 35% which is down from 55% in 2019, also some history of noncompliance with cardiologist,, on IV Lasix, Coreg, ARB combination which will be continued.  Defer any further cardiology work-up to the cardiology team.  For pneumonia will give him 4 days of oral azithromycin.  Chest x-ray continues to show pulmonary vascular congestion, will check CRP to correlate with inflammation versus fluid overload.  CKD stage IIIa.  Baseline creatinine around 1.3, monitor, avoid nephrotoxins.  Renal function and potassium seem to have improved.  Acute on chronic systolic heart failure.   Kindly see #1 above.  Note in 2019 his EF was 55% which has dropped to 35% now.  Cardiology on board.  Management as a #1 above.  CT chest revealing mediastinal lymphadenopathy.  Outpatient follow-up with pulmonary, needs repeat CT scan within 3 months.   History of hypertension.  Stable on current combination of Coreg, ARB and diuretic.  Mild asymptomatic transaminitis due to hepatic congestion.  Almost resolved, right upper quadrant ultrasound stable.  Morbid obesity.  BMI 47.  Outpatient follow-up with PCP for weight loss.  DM type II.  Lantus and sliding scale, dose was adjusted further on 02/13/2022, DM and insulin education.  Lab Results  Component Value Date   HGBA1C 9.9 (H) 02/10/2022   CBG (last 3)  Recent Labs    02/13/22 1530 02/13/22 2149 02/14/22 0746  GLUCAP 316* 154* 119*        Condition - Fair  Family Communication  :  None present  Code Status :  Full  Consults  :  Cards, PCCM  PUD Prophylaxis :    Procedures  :     CTA - Extensive patchy alveolar infiltrates are seen in both lungs suggesting possible multifocal pneumonia. There are no cavitary lesions in the lung fields. There are enlarged lymph nodes in the mediastinum, possibly suggesting reactive hyperplasia. Follow-up CT chest in 3 months should be considered to assess resolution and to rule out any neoplastic process in the lymph nodes. Part of this finding may suggest pulmonary edema. There is minimal right pleural effusion. Heart is enlarged in size.   RUQ [place] - stable  TTE -  1. Left ventricular ejection fraction, by estimation, is 30 to 35%. The left ventricle has moderately decreased function. The left ventricle demonstrates global hypokinesis. Left ventricular diastolic parameters are indeterminate.  2. Right ventricular systolic function is normal. The right ventricular size is normal.  3. The mitral valve is normal in structure. Mild to moderate mitral valve regurgitation. No evidence of mitral  stenosis.  4. The aortic valve is normal in structure. Aortic valve regurgitation is not visualized. No aortic stenosis is present.  5. There is mild dilatation of the ascending aorta, measuring 38 mm.  6. The inferior vena cava is normal in size with greater than 50% respiratory variability, suggesting right atrial pressure of 3 mmHg.      Disposition Plan  :    Status is: Inpatient  DVT Prophylaxis  :    heparin injection 5,000 Units Start: 02/09/22 1400    Lab Results  Component Value Date   PLT 474 (H) 02/14/2022    Diet :  Diet Order             Diet Carb Modified Fluid consistency: Thin; Room service appropriate? Yes with Assist; Fluid restriction: 1200 mL Fluid  Diet effective now                    Inpatient Medications  Scheduled Meds:  azithromycin  500 mg Oral Daily   carvedilol  12.5 mg Oral BID WC   chlorhexidine  15 mL Mouth Rinse BID   Chlorhexidine Gluconate Cloth  6 each Topical Q0600   furosemide  40 mg Intravenous Q12H   heparin injection (subcutaneous)  5,000 Units Subcutaneous Q8H   insulin aspart  0-15 Units Subcutaneous TID WC   insulin aspart  0-5 Units Subcutaneous QHS   insulin aspart  2 Units Subcutaneous TID WC   insulin glargine-yfgn  22 Units Subcutaneous Daily   [START ON 02/15/2022] irbesartan  75 mg Oral Daily   mouth rinse  15 mL Mouth Rinse q12n4p   melatonin  3 mg Oral QHS   nystatin   Topical BID   Continuous Infusions:   PRN Meds:.acetaminophen, benzonatate, ipratropium-albuterol  Time Spent in minutes  30   Lala Lund M.D on 02/14/2022 at 11:43 AM  To page go to [email or web address]  [facility] -  Office  [phone]  See all Orders from today for further details    Objective:   Vitals:   02/14/22 0347 02/14/22 0746 02/14/22 0833 02/14/22 1141  BP: (!) 136/107 (!) 148/116  (!) 146/103  Pulse: 74 95  77  Resp: (!) 23 20  (!) 24  Temp: 98.5 F (36.9 C) 98.2 F (36.8 C)  98 F (36.7 C)  TempSrc: Oral  Oral  Oral  SpO2: 98% (!) 89% 93% 100%  Weight:      Height:        Wt Readings from Last 3 Encounters:  02/09/22 129.3 kg  08/23/18 129.3 kg  05/30/18 123.1 kg     Intake/Output Summary (Last 24 hours) at 02/14/2022 1143 Last data filed at 02/14/2022 0833 Gross per 24 hour  Intake 240 ml  Output 5200 ml  Net -4960 ml     Physical Exam  Awake Alert, No new F.N deficits, Normal affect [place].AT,PERRAL Supple Neck, No JVD,   Symmetrical Chest wall movement, Good air movement bilaterally, mild crackles RRR,No Gallops, Rubs or new Murmurs,  +ve B.Sounds, Abd Soft, No tenderness,   No Cyanosis, Clubbing or edema     Data Review:    CBC Recent Labs  Lab 02/09/22 0415 02/09/22 0542 02/10/22 0102 02/12/22 0151 02/13/22 0546 02/14/22 0115  WBC 13.6*  --  10.3 11.9* 11.2* 11.6*  HGB 13.4 11.9* 13.3 13.6 14.0 14.0  HCT 40.9 35.0* 40.8 41.8 43.2 44.5  PLT 380  --  353 451* 429* 474*  MCV 89.5  --  89.9 89.9 89.8 90.8  MCH 29.3  --  29.3 29.2 29.1 28.6  MCHC 32.8  --  32.6 32.5 32.4 31.5  RDW 13.3  --  13.5 13.4 13.2 13.2  LYMPHSABS 1.1  --   --   --  0.6* 1.4  MONOABS 0.9  --   --   --  0.4 1.0  EOSABS 0.1  --   --   --  0.0 0.1  BASOSABS 0.1  --   --   --  0.0 0.0    Electrolytes Recent Labs  Lab 02/09/22 0415 02/09/22 0536 02/09/22 0542 02/10/22 0102 02/11/22 2120 02/12/22 0151 02/13/22 0546 02/14/22 0115  NA 136  --  135 138  --  135 136 139  K 4.1  --  4.1 4.4  --  4.7 5.0 4.4  CL 102  --   --  103  --  100 96* 96*  CO2 25  --   --  26  --  27 32 32  GLUCOSE 210*  --   --  165* 452* 205* 231* 104*  BUN 19  --   --  26*  --  49* 47* 45*  CREATININE 1.27*  --   --  1.19  --  1.29* 1.34* 1.16  CALCIUM 8.8*  --   --  8.6*  --  8.8* 9.1 9.2  AST 62*  --   --  34  --   --  20 22  ALT 64*  --   --  49*  --   --  34 32  ALKPHOS 140*  --   --  126  --   --  109 96  BILITOT 0.6  --   --  0.8  --   --  0.5 0.6  ALBUMIN 2.2*  --   --  2.0*  --   --  2.3* 2.2*  MG  --   --   --  2.2  --   --  2.3 2.1  PROCALCITON 0.33  --   --   --   --   --   --   --   LATICACIDVEN 1.3 1.0  --   --   --   --   --   --   INR 1.2  --   --   --   --   --   --   --   HGBA1C  --   --   --  9.9*  --   --   --   --   BNP 717.4*  --   --   --   --   --  962.4*  --     ------------------------------------------------------------------------------------------------------------------ No results for input(s): CHOL, HDL, LDLCALC, TRIG, CHOLHDL, LDLDIRECT in the last 72 hours.  Lab Results  Component Value Date   HGBA1C 9.9 (H) 02/10/2022        Radiology Reports DG Chest 2 View  Result Date: 02/13/2022 CLINICAL DATA:  Cough, hypertension EXAM: CHEST - 2 VIEW COMPARISON:  02/10/2022 FINDINGS: Heart remains enlarged with similar to minimal improvement in the severe bilateral airspace disease nonspecific for bilateral pneumonia, or alveolar edema. No enlarging effusion or pneumothorax. Trachea midline. No acute osseous finding. IMPRESSION: Stable to minimal improvement in the extensive bilateral airspace process concerning for pneumonia or acute alveolar edema. Stable cardiomegaly Electronically Signed   By: Jerilynn Mages.  Shick M.D.   On: 02/13/2022 10:48   DG Chest Port 1 View  Result Date: 02/14/2022 CLINICAL DATA:  Left chest pain. EXAM: PORTABLE CHEST 1 VIEW COMPARISON:  Radiograph February 13, 2022 FINDINGS: Similar enlarged cardiac silhouette and central vascular prominence. No significant interval change in the severe bilateral interstitial and airspace opacities. Visible pleural effusion or pneumothorax. The visualized skeletal structures are unchanged. IMPRESSION: Similar enlarged cardiac silhouette and central vascular prominence with severe bilateral interstitial and airspace opacities. Electronically Signed   By: Dahlia Bailiff M.D.   On: 02/14/2022 10:03

## 2022-02-15 ENCOUNTER — Other Ambulatory Visit (HOSPITAL_COMMUNITY): Payer: Self-pay

## 2022-02-15 DIAGNOSIS — I43 Cardiomyopathy in diseases classified elsewhere: Secondary | ICD-10-CM

## 2022-02-15 DIAGNOSIS — I110 Hypertensive heart disease with heart failure: Secondary | ICD-10-CM

## 2022-02-15 DIAGNOSIS — I10 Essential (primary) hypertension: Secondary | ICD-10-CM

## 2022-02-15 LAB — MAGNESIUM: Magnesium: 2.1 mg/dL (ref 1.7–2.4)

## 2022-02-15 LAB — GLUCOSE, CAPILLARY
Glucose-Capillary: 150 mg/dL — ABNORMAL HIGH (ref 70–99)
Glucose-Capillary: 196 mg/dL — ABNORMAL HIGH (ref 70–99)
Glucose-Capillary: 298 mg/dL — ABNORMAL HIGH (ref 70–99)
Glucose-Capillary: 259 mg/dL — ABNORMAL HIGH (ref 70–99)

## 2022-02-15 LAB — CBC WITH DIFFERENTIAL/PLATELET
Abs Immature Granulocytes: 0.08 10*3/uL — ABNORMAL HIGH (ref 0.00–0.07)
Basophils Absolute: 0 10*3/uL (ref 0.0–0.1)
Basophils Relative: 0 %
Eosinophils Absolute: 0.5 10*3/uL (ref 0.0–0.5)
Eosinophils Relative: 6 %
HCT: 46.8 % (ref 39.0–52.0)
Hemoglobin: 15.3 g/dL (ref 13.0–17.0)
Immature Granulocytes: 1 %
Lymphocytes Relative: 20 %
Lymphs Abs: 1.9 10*3/uL (ref 0.7–4.0)
MCH: 29.4 pg (ref 26.0–34.0)
MCHC: 32.7 g/dL (ref 30.0–36.0)
MCV: 89.8 fL (ref 80.0–100.0)
Monocytes Absolute: 0.8 10*3/uL (ref 0.1–1.0)
Monocytes Relative: 8 %
Neutro Abs: 6.2 10*3/uL (ref 1.7–7.7)
Neutrophils Relative %: 65 %
Platelets: 413 10*3/uL — ABNORMAL HIGH (ref 150–400)
RBC: 5.21 MIL/uL (ref 4.22–5.81)
RDW: 13.3 % (ref 11.5–15.5)
WBC: 9.5 10*3/uL (ref 4.0–10.5)
nRBC: 0 % (ref 0.0–0.2)

## 2022-02-15 LAB — COMPREHENSIVE METABOLIC PANEL
ALT: 37 U/L (ref 0–44)
AST: 26 U/L (ref 15–41)
Albumin: 2.4 g/dL — ABNORMAL LOW (ref 3.5–5.0)
Alkaline Phosphatase: 97 U/L (ref 38–126)
Anion gap: 10 (ref 5–15)
BUN: 41 mg/dL — ABNORMAL HIGH (ref 6–20)
CO2: 33 mmol/L — ABNORMAL HIGH (ref 22–32)
Calcium: 9.4 mg/dL (ref 8.9–10.3)
Chloride: 93 mmol/L — ABNORMAL LOW (ref 98–111)
Creatinine, Ser: 1.28 mg/dL — ABNORMAL HIGH (ref 0.61–1.24)
GFR, Estimated: >60 mL/min (ref >60)
Glucose, Bld: 118 mg/dL — ABNORMAL HIGH (ref 70–99)
Potassium: 4.4 mmol/L (ref 3.5–5.1)
Sodium: 136 mmol/L (ref 135–145)
Total Bilirubin: 0.6 mg/dL (ref 0.3–1.2)
Total Protein: 6.3 g/dL — ABNORMAL LOW (ref 6.5–8.1)

## 2022-02-15 LAB — BRAIN NATRIURETIC PEPTIDE: B Natriuretic Peptide: 495.3 pg/mL — ABNORMAL HIGH (ref 0.0–100.0)

## 2022-02-15 LAB — C-REACTIVE PROTEIN: CRP: 2.5 mg/dL — ABNORMAL HIGH (ref <1.0)

## 2022-02-15 MED ORDER — HYDRALAZINE HCL 50 MG PO TABS: 50.0000 mg | ORAL_TABLET | Freq: Three times a day (TID) | ORAL | Status: DC

## 2022-02-15 MED ORDER — ACETAZOLAMIDE 250 MG PO TABS
500.0000 mg | ORAL_TABLET | Freq: Once | ORAL | Status: AC
  Administered 2022-02-15: 500 mg via ORAL
  Filled 2022-02-15: qty 2

## 2022-02-15 MED ORDER — HYDRALAZINE HCL 20 MG/ML IJ SOLN: 10.0000 mg | Freq: Four times a day (QID) | INTRAMUSCULAR | Status: DC | PRN

## 2022-02-15 MED ORDER — CARVEDILOL 25 MG PO TABS
25.0000 mg | ORAL_TABLET | Freq: Two times a day (BID) | ORAL | Status: DC
Start: 2022-02-15 — End: 2022-02-16
  Administered 2022-02-15 – 2022-02-16 (×2): 25 mg via ORAL
  Filled 2022-02-15 (×2): qty 1

## 2022-02-15 NOTE — Progress Notes (Signed)
Progress Note  Patient Name: Justin [name] Date of Encounter: 02/15/2022  [facility] Cardiologist: Jodelle Red, MD   Subjective   Patient denies having any chest pain, SOB. Continues to have a nonproductive cough and is still on supplemental oxygen (3.5 L via nasal cannula)   Inpatient Medications    Scheduled Meds:  carvedilol  12.5 mg Oral BID WC   chlorhexidine  15 mL Mouth Rinse BID   Chlorhexidine Gluconate Cloth  6 each Topical Q0600   dapagliflozin propanediol  10 mg Oral Daily   furosemide  40 mg Intravenous Q12H   heparin injection (subcutaneous)  5,000 Units Subcutaneous Q8H   hydrALAZINE  50 mg Oral Q8H   irbesartan  75 mg Oral Daily   mouth rinse  15 mL Mouth Rinse q12n4p   melatonin  3 mg Oral QHS   metFORMIN  500 mg Oral BID WC   nystatin   Topical BID   Continuous Infusions:  PRN Meds: acetaminophen, benzonatate, hydrALAZINE, ipratropium-albuterol   Vital Signs    Vitals:   02/15/22 0000 02/15/22 0500 02/15/22 0515 02/15/22 0811  BP: (!) 142/92  (!) 148/110 (!) 155/118  Pulse: 75  83 84  Resp:   20 20  Temp: 98 F (36.7 C)  97.8 F (36.6 C) 97.7 F (36.5 C)  TempSrc: Oral  Oral Oral  SpO2: 92%  94% 90%  Weight:  103.8 kg    Height:        Intake/Output Summary (Last 24 hours) at 02/15/2022 1044 Last data filed at 02/15/2022 0943 Gross per 24 hour  Intake 1430 ml  Output 5820 ml  Net -4390 ml      02/15/2022    5:00 AM 02/09/2022    5:29 AM 08/23/2018    9:36 AM  Last 3 Weights  Weight (lbs) 228 lb 13.4 oz 285 lb 0.9 oz 285 lb  Weight (kg) 103.8 kg 129.3 kg 129.275 kg      Telemetry    Sinus rhythm  - Personally Reviewed  ECG    No new tracings since 6/3 - Personally Reviewed  Physical Exam   GEN: No acute distress. Laying comfortably in the bed wearing nasal cannula  Neck: No JVD Cardiac: RRR, no murmurs, rubs, or gallops.  Respiratory: Fine crackles in bilateral lung bases, otherwise clear to ausculation  bilaterally  GI: Soft, nontender, non-distended  MS: No edema; No deformity. Neuro:  Nonfocal  Psych: Normal affect   Labs    High Sensitivity Troponin:  No results for input(s): TROPONINIHS in the last 720 hours.   Chemistry Recent Labs  Lab 02/13/22 0546 02/14/22 0115 02/15/22 0342  NA 136 139 136  K 5.0 4.4 4.4  CL 96* 96* 93*  CO2 32 32 33*  GLUCOSE 231* 104* 118*  BUN 47* 45* 41*  CREATININE 1.34* 1.16 1.28*  CALCIUM 9.1 9.2 9.4  MG 2.3 2.1 2.1  PROT 6.4* 6.2* 6.3*  ALBUMIN 2.3* 2.2* 2.4*  AST 20 22 26   ALT 34 32 37  ALKPHOS 109 96 97  BILITOT 0.5 0.6 0.6  GFRNONAA >60 >60 >60  ANIONGAP 8 11 10     Lipids No results for input(s): CHOL, TRIG, HDL, LABVLDL, LDLCALC, CHOLHDL in the last 168 hours.  Hematology Recent Labs  Lab 02/13/22 0546 02/14/22 0115 02/15/22 0342  WBC 11.2* 11.6* 9.5  RBC 4.81 4.90 5.21  HGB 14.0 14.0 15.3  HCT 43.2 44.5 46.8  MCV 89.8 90.8 89.8  MCH 29.1 28.6  29.4  MCHC 32.4 31.5 32.7  RDW 13.2 13.2 13.3  PLT 429* 474* 413*   Thyroid No results for input(s): TSH, FREET4 in the last 168 hours.  BNP Recent Labs  Lab 02/09/22 0415 02/13/22 0546  BNP 717.4* 962.4*    DDimer No results for input(s): DDIMER in the last 168 hours.   Radiology    DG Chest Port 1 View  Result Date: 02/14/2022 CLINICAL DATA:  Left chest pain. EXAM: PORTABLE CHEST 1 VIEW COMPARISON:  Radiograph February 13, 2022 FINDINGS: Similar enlarged cardiac silhouette and central vascular prominence. No significant interval change in the severe bilateral interstitial and airspace opacities. Visible pleural effusion or pneumothorax. The visualized skeletal structures are unchanged. IMPRESSION: Similar enlarged cardiac silhouette and central vascular prominence with severe bilateral interstitial and airspace opacities. Electronically Signed   By: Maudry Mayhew M.D.   On: 02/14/2022 10:03    Cardiac Studies   Echocardiogram 02/09/22  1. Left ventricular ejection fraction,  by estimation, is 30 to 35%. The  left ventricle has moderately decreased function. The left ventricle  demonstrates global hypokinesis. Left ventricular diastolic parameters are  indeterminate.   2. Right ventricular systolic function is normal. The right ventricular  size is normal.   3. The mitral valve is normal in structure. Mild to moderate mitral valve  regurgitation. No evidence of mitral stenosis.   4. The aortic valve is normal in structure. Aortic valve regurgitation is  not visualized. No aortic stenosis is present.   5. There is mild dilatation of the ascending aorta, measuring 38 mm.   6. The inferior vena cava is normal in size with greater than 50%  respiratory variability, suggesting right atrial pressure of 3 mmHg.   Patient Profile     41 y.o. male with a hx of systolic HF who is was seen for the evaluation of SOB at the request of Dr. Delton Coombes.  Assessment & Plan    Acute on chronic systolic heart failure:    - Echocardiogram this admission showed EF 30-35%  (down from EF 55-60% in 2019)  - LV dysfunction felt to be hypertensive in etiology, cath 2017 with normal coronary arteries.  -Cannot afford Entresto  -continue Irbesartan 75 mg daily, Carvedilol 12.5mg  BID  -per case management, Marcelline Deist is $608.21, London Pepper is $498.81 so it is unlikely that he will be able to afford these drugs -it is very difficult to assess volume status in setting of morbid obesity -BNP increased from 717 on admission to 962.4 on 6/5 - Has been on lasix 40 mg IV BID this admission>> he put out 5.2 L urine yesterday and is net -20 L since admit. Breathing improving with diuresis  -continue IV Lasix for now  -SCr slightly up today, up to 1.28.  - Patient has continued to complain of cough and requiring supplemental oxygen-- CXR repeated yesterday and showed stable to minimal improvement in extensive bilateral airspace process concerning for pneumonia  - Given patient's morbid obesity which  makes it difficult to assess volume status, continued cough/hypoxia after good diuresis, may need RHC this admission. Will discuss with MD    Questionable atypical pneumonia  - Azithromycin per primary team    Hypertension:   -BP controlled on exam today - Primary team added hydralazine 50 mg TID today  -continue Irbesartan 75 mg daily and Carvedilol 12.5mg  BID   Diabetes mellitus:   -A1C is 9.9.   -Certainly Marcelline Deist would of benefit given CHF, however is too expensive  CKD class II:  -Creat slightly up to 1.28 today -continue to follow during diuresis    Mitral regurgitation:  -follow with annual echo      For questions or updates, please contact [facility] Please consult [email or web address] for contact info under        Signed, Jonita Albee, PA-C  02/15/2022, 10:44 AM

## 2022-02-15 NOTE — Progress Notes (Signed)
Heart Failure Navigator Progress Note  Following this hospitalization to assess for HV TOC readiness.   EF 30-35%   ? Right heart cath?  Justin [name], BSN, Scientist, clinical (histocompatibility and immunogenetics) Only

## 2022-02-15 NOTE — Progress Notes (Signed)
PROGRESS NOTE                                                                                                                                                                                                             Patient Demographics:    Justin [name], is a 41 y.o. male, DOB - [birth year], [id number]  Outpatient Primary MD for the patient is Patient, No Pcp Per (Inactive)    LOS - 6  Admit date - 02/09/2022    Chief Complaint  Patient presents with   Shortness of Breath       Brief Narrative (HPI from H&P)   41 year old man (Spanish-speaking) who presented to [facility] 6/1 for SOB. PMHx significant for HTN, LVH, hypertriglyceridemia. Oxygen sats found to be 70% with pulmonary edema on CXR initially required BiPAP was admitted by the pulmonary critical care team, was also seen by cardiology, he was stabilized and transferred to my service on 02/09/2022.   Subjective:   Patient in bed, appears comfortable, denies any headache, no fever, no chest pain or pressure, no shortness of breath , no abdominal pain. No focal weakness.   Assessment  & Plan :    Acute hypoxic respiratory failure due to pulmonary edema and questionable atypical pneumonia. he was initially on BiPAP, has been diuresed and now on 2 L nasal cannula, cardiology on board, echo noted with EF 35% which is down from 55% in 2019, also some history of noncompliance with cardiologist,, on IV Lasix, Coreg, ARB combination which will be continued. Adding Hydralazine as BP ++ high on 02/15/2022.  Also developing mild hypochloremia and hypercarbia due to diuresis, 1 dose of Diamox on 02/15/2022.  Defer any further cardiology work-up to the cardiology team.  For pneumonia will give him 4 days of oral azithromycin.  Chest x-ray continues to show pulmonary vascular congestion, CRP is unremarkable suggesting most of the chest x-ray changes are due to pulmonary congestion.  CKD  stage IIIa.  Baseline creatinine around 1.3, monitor, avoid nephrotoxins.  Renal function and potassium seem to have improved.  Acute on chronic systolic heart failure.  Kindly see #1 above.  Note in 2019 his EF was 55% which has dropped to 35% now.  Cardiology on board.  Management as a #1 above.  CT chest revealing mediastinal lymphadenopathy.  Outpatient follow-up with pulmonary, needs repeat  CT scan within 3 months.   History of hypertension.  Stable on current combination of Coreg, ARB and diuretic.  Mild asymptomatic transaminitis due to hepatic congestion.  Almost resolved, right upper quadrant ultrasound stable.  Morbid obesity.  BMI 47.  Outpatient follow-up with PCP for weight loss.  DM type II.  Received DM and insulin education, diabetic coordinator not confident that patient will be able to take insulin at home hence placed on metformin and Jardiance combination on 02/14/2022 continue to monitor CBGs  Lab Results  Component Value Date   HGBA1C 9.9 (H) 02/10/2022   CBG (last 3)  Recent Labs    02/14/22 1535 02/14/22 2110 02/15/22 0809  GLUCAP 221* 183* 150*        Condition - Fair  Family Communication  :  None present  Code Status :  Full  Consults  :  Cards, PCCM  PUD Prophylaxis :    Procedures  :     CTA - Extensive patchy alveolar infiltrates are seen in both lungs suggesting possible multifocal pneumonia. There are no cavitary lesions in the lung fields. There are enlarged lymph nodes in the mediastinum, possibly suggesting reactive hyperplasia. Follow-up CT chest in 3 months should be considered to assess resolution and to rule out any neoplastic process in the lymph nodes. Part of this finding may suggest pulmonary edema. There is minimal right pleural effusion. Heart is enlarged in size.   RUQ [place] - stable  TTE -  1. Left ventricular ejection fraction, by estimation, is 30 to 35%. The left ventricle has moderately decreased function. The left ventricle  demonstrates global hypokinesis. Left ventricular diastolic parameters are indeterminate.  2. Right ventricular systolic function is normal. The right ventricular size is normal.  3. The mitral valve is normal in structure. Mild to moderate mitral valve regurgitation. No evidence of mitral stenosis.  4. The aortic valve is normal in structure. Aortic valve regurgitation is not visualized. No aortic stenosis is present.  5. There is mild dilatation of the ascending aorta, measuring 38 mm.  6. The inferior vena cava is normal in size with greater than 50% respiratory variability, suggesting right atrial pressure of 3 mmHg.      Disposition Plan  :    Status is: Inpatient  DVT Prophylaxis  :    heparin injection 5,000 Units Start: 02/09/22 1400    Lab Results  Component Value Date   PLT 413 (H) 02/15/2022    Diet :  Diet Order             Diet Carb Modified Fluid consistency: Thin; Room service appropriate? Yes with Assist; Fluid restriction: 1200 mL Fluid  Diet effective now                    Inpatient Medications  Scheduled Meds:  acetaZOLAMIDE  500 mg Oral Once   azithromycin  500 mg Oral Daily   carvedilol  12.5 mg Oral BID WC   chlorhexidine  15 mL Mouth Rinse BID   Chlorhexidine Gluconate Cloth  6 each Topical Q0600   dapagliflozin propanediol  10 mg Oral Daily   furosemide  40 mg Intravenous Q12H   heparin injection (subcutaneous)  5,000 Units Subcutaneous Q8H   hydrALAZINE  50 mg Oral Q8H   irbesartan  75 mg Oral Daily   mouth rinse  15 mL Mouth Rinse q12n4p   melatonin  3 mg Oral QHS   metFORMIN  500 mg Oral BID WC  nystatin   Topical BID   Continuous Infusions:   PRN Meds:.acetaminophen, benzonatate, hydrALAZINE, ipratropium-albuterol  Time Spent in minutes  30   Lala Lund M.D on 02/15/2022 at 8:28 AM  To page go to [email or web address]   [facility] -  Office  [phone]  See all Orders from today for further details    Objective:    Vitals:   02/15/22 0000 02/15/22 0500 02/15/22 0515 02/15/22 0811  BP: (!) 142/92  (!) 148/110 (!) 155/118  Pulse: 75  83 84  Resp:   20 20  Temp: 98 F (36.7 C)  97.8 F (36.6 C) 97.7 F (36.5 C)  TempSrc: Oral  Oral Oral  SpO2: 92%  94% 90%  Weight:  103.8 kg    Height:        Wt Readings from Last 3 Encounters:  02/15/22 103.8 kg  08/23/18 129.3 kg  05/30/18 123.1 kg     Intake/Output Summary (Last 24 hours) at 02/15/2022 0828 Last data filed at 02/15/2022 0814 Gross per 24 hour  Intake 1670 ml  Output 6570 ml  Net -4900 ml     Physical Exam  Awake Alert, No new F.N deficits, Normal affect [place].AT,PERRAL Supple Neck, No JVD,   Symmetrical Chest wall movement, Good air movement bilaterally, CTAB RRR,No Gallops, Rubs or new Murmurs,  +ve B.Sounds, Abd Soft, No tenderness,   No Cyanosis, Clubbing or edema     Data Review:    CBC Recent Labs  Lab 02/09/22 0415 02/09/22 0542 02/10/22 0102 02/12/22 0151 02/13/22 0546 02/14/22 0115 02/15/22 0342  WBC 13.6*  --  10.3 11.9* 11.2* 11.6* 9.5  HGB 13.4   < > 13.3 13.6 14.0 14.0 15.3  HCT 40.9   < > 40.8 41.8 43.2 44.5 46.8  PLT 380  --  353 451* 429* 474* 413*  MCV 89.5  --  89.9 89.9 89.8 90.8 89.8  MCH 29.3  --  29.3 29.2 29.1 28.6 29.4  MCHC 32.8  --  32.6 32.5 32.4 31.5 32.7  RDW 13.3  --  13.5 13.4 13.2 13.2 13.3  LYMPHSABS 1.1  --   --   --  0.6* 1.4 1.9  MONOABS 0.9  --   --   --  0.4 1.0 0.8  EOSABS 0.1  --   --   --  0.0 0.1 0.5  BASOSABS 0.1  --   --   --  0.0 0.0 0.0   < > = values in this interval not displayed.    Electrolytes Recent Labs  Lab 02/09/22 0415 02/09/22 0536 02/09/22 0542 02/10/22 0102 02/11/22 2120 02/12/22 0151 02/13/22 0546 02/14/22 0115 02/14/22 1200 02/15/22 0342  NA 136  --    < > 138  --  135 136 139  --  136  K 4.1  --    < > 4.4  --  4.7 5.0 4.4  --  4.4  CL 102  --   --  103  --  100 96* 96*  --  93*  CO2 25  --   --  26  --  27 32 32  --  33*  GLUCOSE  210*  --   --  165* 452* 205* 231* 104*  --  118*  BUN 19  --   --  26*  --  49* 47* 45*  --  41*  CREATININE 1.27*  --   --  1.19  --  1.29* 1.34* 1.16  --  1.28*  CALCIUM 8.8*  --   --  8.6*  --  8.8* 9.1 9.2  --  9.4  AST 62*  --   --  34  --   --  20 22  --  26  ALT 64*  --   --  49*  --   --  34 32  --  37  ALKPHOS 140*  --   --  126  --   --  109 96  --  97  BILITOT 0.6  --   --  0.8  --   --  0.5 0.6  --  0.6  ALBUMIN 2.2*  --   --  2.0*  --   --  2.3* 2.2*  --  2.4*  MG  --   --   --  2.2  --   --  2.3 2.1  --  2.1  CRP  --   --   --   --   --   --   --   --  4.3* 2.5*  PROCALCITON 0.33  --   --   --   --   --   --   --   --   --   LATICACIDVEN 1.3 1.0  --   --   --   --   --   --   --   --   INR 1.2  --   --   --   --   --   --   --   --   --   HGBA1C  --   --   --  9.9*  --   --   --   --   --   --   BNP 717.4*  --   --   --   --   --  962.4*  --   --   --    < > = values in this interval not displayed.    ------------------------------------------------------------------------------------------------------------------ No results for input(s): CHOL, HDL, LDLCALC, TRIG, CHOLHDL, LDLDIRECT in the last 72 hours.  Lab Results  Component Value Date   HGBA1C 9.9 (H) 02/10/2022      Radiology Reports DG Chest 2 View  Result Date: 02/13/2022 CLINICAL DATA:  Cough, hypertension EXAM: CHEST - 2 VIEW COMPARISON:  02/10/2022 FINDINGS: Heart remains enlarged with similar to minimal improvement in the severe bilateral airspace disease nonspecific for bilateral pneumonia, or alveolar edema. No enlarging effusion or pneumothorax. Trachea midline. No acute osseous finding. IMPRESSION: Stable to minimal improvement in the extensive bilateral airspace process concerning for pneumonia or acute alveolar edema. Stable cardiomegaly Electronically Signed   By: Jerilynn Mages.  Shick M.D.   On: 02/13/2022 10:48   DG Chest Port 1 View  Result Date: 02/14/2022 CLINICAL DATA:  Left chest pain. EXAM: PORTABLE CHEST  1 VIEW COMPARISON:  Radiograph February 13, 2022 FINDINGS: Similar enlarged cardiac silhouette and central vascular prominence. No significant interval change in the severe bilateral interstitial and airspace opacities. Visible pleural effusion or pneumothorax. The visualized skeletal structures are unchanged. IMPRESSION: Similar enlarged cardiac silhouette and central vascular prominence with severe bilateral interstitial and airspace opacities. Electronically Signed   By: Dahlia Bailiff M.D.   On: 02/14/2022 10:03

## 2022-02-15 NOTE — Plan of Care (Signed)
  Problem: Education: Goal: Ability to describe self-care measures that may prevent or decrease complications (Diabetes Survival Skills Education) will improve Outcome: Progressing   Problem: Fluid Volume: Goal: Ability to maintain a balanced intake and output will improve Outcome: Progressing   Problem: Skin Integrity: Goal: Risk for impaired skin integrity will decrease Outcome: Progressing   

## 2022-02-15 NOTE — TOC Progression Note (Signed)
Transition of Care [facility]) - Progression Note    Patient Details  Name: Justin [name] MRN: [id number] Date of Birth: Jul 07, 1981  Transition of Care [facility]) CM/SW Contact  Cyndi Bender, RN Phone Number: 02/15/2022, 11:51 AM  Clinical Narrative:     HF pharmacist completed the medication assistance from for [facility]. Free 30day copay card will be given to patient to use after MATCH. The cost of the other medications: Metformin (Glucophage) $9.00, irbesartan (Avapro) $17.47, Furosemide (Lasix) $9.00, Carvedilol (Coreg) $9.00. PCP apt made and information on the AVS. Financial counselor emailed.  TOC will continue to follow for needs. Expected Discharge Plan: Home/Self Care Barriers to Discharge: Continued Medical Work up  Expected Discharge Plan and Services Expected Discharge Plan: Home/Self Care   Discharge Planning Services: [place] [facility], Follow-up appt scheduled, Medication Assistance   Living arrangements for the past 2 months: Single Family Home                                       Social Determinants of Health (SDOH) Interventions    Readmission Risk Interventions     View : No data to display.

## 2022-02-15 NOTE — Progress Notes (Signed)
RT called to room for desaturations. Upon arrival to pt room pt on 5L with SpO2 88%. Pt asleep upon arrival, no distress noted.  Pt endorses cough, no shortness of breath. PRN breathing treatment offered to pt at this time to which pt declines at this time. HFNC increased to 7L. SpO2 93% with a good pleth. RT will continue to monitor and be available as needed.

## 2022-02-15 NOTE — Progress Notes (Signed)
Heart Failure Stewardship Pharmacist Progress Note   PCP: Patient, No Pcp Per (Inactive) PCP-Cardiologist: Buford Dresser, MD   HPI:  41 yo spanish speaking male with pmh LVH, HTN, hypertriglyceridemia presented after feeling sob. Oxygen sats found to be 70% with pulmonary edema on cxr. Pt did endorse a recent dx of pna, although unclear if he completed a course of abx.   02/13/2022 Chest X-ray showed stable to minimal improvement in extensive bilateral airspace process concerning for pneumonia or acute alveloar edema.   Per med rec patient has not been taking any of his home medications. Upon asking the patient he told me that he stopped taking them because he felt fine. I however, also suspect that cost is an issue.   Echo this admission (02/09/2022) with EF 30-35%, RV normal, mild MR. This is a new drop as 2019 echo with EF of 55-60%.   Current HF Medications: Diuretic: furosemide 40 mg IV BID + acetazolamide 500 mg x1 Beta Blocker: carvedilol 12.5 mg BID ACE/ARB/ARNI: irbesartan 75 mg daily SGLT2i: Farxiga 10 mg daily Other: hydralazine 50 mg q8h  Prior to admission HF Medications: Diuretic: furosemide 40 mg daily Beta blocker: carvedilol 25 mg BID ACE/ARB/ARNI: lisinopril 20 mg BID Aldosterone Antagonist: spironolactone 50 mg daily SGLT2i: none Per med rec patient not taking any medications  Pertinent Lab Values: Serum creatinine 1.28, BUN 41, Potassium 4.4, Sodium 136, BNP 962, Magnesium 2.1, A1c 9.9%   Vital Signs: Weight: 228 lbs (admission weight: 285 lbs) Blood pressure: 140-155/90-120 Heart rate: 85  I/O: -4.6 L yesterday; net -19.9 L  Medication Assistance / Insurance Benefits Check: Does the patient have prescription insurance?  No Type of insurance plan: N/A  Does the patient qualify for medication assistance through manufacturers or grants?   Yes Eligible grants and/or patient assistance programs: Farxiga Medication assistance applications in progress:  pending  Medication assistance applications approved: none Approved medication assistance renewals will be completed by: pending  Outpatient Pharmacy:  Prior to admission outpatient pharmacy: [facility] Is the patient willing to use [place] at discharge? Yes Is the patient willing to transition their outpatient pharmacy to utilize a [facility] outpatient pharmacy?   Pending   Assessment: 1. Acute on chronic systolic CHF (LVEF 99991111), due to HTN. NYHA class II symptoms. -Patient still volume overloaded with BNP that increased from 717 to 962. Scr up today from 1.16 to 1.28. CO2 elevated at 33. Given acetazolamide 500 mg x1 with current furosemide regimen. He is diuresising well with current regimen. Continue furosemide 40 mg IV BID. -HR controlled, continue carvedilol 12.5 mg BID, can further titrate as patient becomes more evolemic -BP elevated, Scr bumped slightly. Hydralazine 50 mg q8h added by hospitalist. Recommend further titration of irbesartan, however patient receiving first dose of 75 mg today. Patient would be a good Entresto candidate given elevated BP however will not be able to obtain coverage.  -Patient not currently on MRA. K 4.4 stable, with Scr bump will defer addition of spironolactone 25 mg daily for now.  -A1c elevated at 9.9%, patient started on Farxiga 10 mg daily and further DM management per primary team.  Plan: 1) Medication changes recommended at this time: -Continue furosemide 40 mg IV BID + acetazolamide 500 mg x1 -Continue carvedilol 12.5 mg BID -Continue irbesartan 75 mg daily + hydralazine 50 mg q8h -Continue Farxiga 10 mg daily  2) Patient assistance: -patient assistance for Farxiga completed 02/14/2022 -plan for Match at discharge  -can use 30 day free  card for [facility] after Match if patient assistance application is still pending -Delene Loll is not an option as he is not a citizen and tax returns are required by the company for patient  assistance -DM coordinator asked me about insulin options, I suggested community health and wellness  3)  Education  - To be completed prior to discharge  Cathrine Muster, PharmD, [facility] PGY2 Cardiology Pharmacy Resident

## 2022-02-16 ENCOUNTER — Inpatient Hospital Stay (HOSPITAL_COMMUNITY): Payer: Self-pay

## 2022-02-16 ENCOUNTER — Encounter (HOSPITAL_COMMUNITY)
Admission: EM | Disposition: A | Discharge status: Home / Self Care | Payer: Self-pay | Source: Home / Self Care | Attending: Internal Medicine

## 2022-02-16 DIAGNOSIS — I472 Ventricular tachycardia, unspecified: Secondary | ICD-10-CM

## 2022-02-16 DIAGNOSIS — I5021 Acute systolic (congestive) heart failure: Secondary | ICD-10-CM

## 2022-02-16 HISTORY — PX: RIGHT/LEFT HEART CATH AND CORONARY ANGIOGRAPHY: CATH118266

## 2022-02-16 LAB — COMPREHENSIVE METABOLIC PANEL
ALT: 33 U/L (ref 0–44)
AST: 21 U/L (ref 15–41)
Albumin: 2.5 g/dL — ABNORMAL LOW (ref 3.5–5.0)
Alkaline Phosphatase: 102 U/L (ref 38–126)
Anion gap: 8 (ref 5–15)
BUN: 45 mg/dL — ABNORMAL HIGH (ref 6–20)
CO2: 31 mmol/L (ref 22–32)
Calcium: 9.7 mg/dL (ref 8.9–10.3)
Chloride: 95 mmol/L — ABNORMAL LOW (ref 98–111)
Creatinine, Ser: 1.44 mg/dL — ABNORMAL HIGH (ref 0.61–1.24)
GFR, Estimated: >60 mL/min (ref >60)
Glucose, Bld: 138 mg/dL — ABNORMAL HIGH (ref 70–99)
Potassium: 3.9 mmol/L (ref 3.5–5.1)
Sodium: 134 mmol/L — ABNORMAL LOW (ref 135–145)
Total Bilirubin: 0.9 mg/dL (ref 0.3–1.2)
Total Protein: 6.8 g/dL (ref 6.5–8.1)

## 2022-02-16 LAB — POCT I-STAT EG7
Acid-Base Excess: 6 mmol/L — ABNORMAL HIGH (ref 0.0–2.0)
Bicarbonate: 32.8 mmol/L — ABNORMAL HIGH (ref 20.0–28.0)
Calcium, Ion: 1.17 mmol/L (ref 1.15–1.40)
HCT: 50 % (ref 39.0–52.0)
Hemoglobin: 17 g/dL (ref 13.0–17.0)
O2 Saturation: 63 %
Potassium: 4.1 mmol/L (ref 3.5–5.1)
Sodium: 135 mmol/L (ref 135–145)
TCO2: 34 mmol/L — ABNORMAL HIGH (ref 22–32)
pCO2, Ven: 52 mmHg (ref 44–60)
pH, Ven: 7.407 (ref 7.25–7.43)
pO2, Ven: 33 mmHg (ref 32–45)

## 2022-02-16 LAB — GLUCOSE, CAPILLARY
Glucose-Capillary: 239 mg/dL — ABNORMAL HIGH (ref 70–99)
Glucose-Capillary: 153 mg/dL — ABNORMAL HIGH (ref 70–99)
Glucose-Capillary: 168 mg/dL — ABNORMAL HIGH (ref 70–99)
Glucose-Capillary: 229 mg/dL — ABNORMAL HIGH (ref 70–99)

## 2022-02-16 LAB — POCT I-STAT 7, (LYTES, BLD GAS, ICA,H+H)
Acid-Base Excess: 5 mmol/L — ABNORMAL HIGH (ref 0.0–2.0)
Bicarbonate: 30.3 mmol/L — ABNORMAL HIGH (ref 20.0–28.0)
Calcium, Ion: 1.27 mmol/L (ref 1.15–1.40)
HCT: 50 % (ref 39.0–52.0)
Hemoglobin: 17 g/dL (ref 13.0–17.0)
O2 Saturation: 93 %
Potassium: 4.3 mmol/L (ref 3.5–5.1)
Sodium: 133 mmol/L — ABNORMAL LOW (ref 135–145)
TCO2: 32 mmol/L (ref 22–32)
pCO2 arterial: 44.4 mmHg (ref 32–48)
pH, Arterial: 7.442 (ref 7.35–7.45)
pO2, Arterial: 66 mmHg — ABNORMAL LOW (ref 83–108)

## 2022-02-16 LAB — CBC WITH DIFFERENTIAL/PLATELET
Abs Immature Granulocytes: 0.06 10*3/uL (ref 0.00–0.07)
Basophils Absolute: 0 10*3/uL (ref 0.0–0.1)
Basophils Relative: 0 %
Eosinophils Absolute: 0.3 10*3/uL (ref 0.0–0.5)
Eosinophils Relative: 4 %
HCT: 50.2 % (ref 39.0–52.0)
Hemoglobin: 16.4 g/dL (ref 13.0–17.0)
Immature Granulocytes: 1 %
Lymphocytes Relative: 21 %
Lymphs Abs: 1.6 10*3/uL (ref 0.7–4.0)
MCH: 28.6 pg (ref 26.0–34.0)
MCHC: 32.7 g/dL (ref 30.0–36.0)
MCV: 87.5 fL (ref 80.0–100.0)
Monocytes Absolute: 0.6 10*3/uL (ref 0.1–1.0)
Monocytes Relative: 8 %
Neutro Abs: 5 10*3/uL (ref 1.7–7.7)
Neutrophils Relative %: 66 %
Platelets: 432 10*3/uL — ABNORMAL HIGH (ref 150–400)
RBC: 5.74 MIL/uL (ref 4.22–5.81)
RDW: 13.7 % (ref 11.5–15.5)
WBC: 7.5 10*3/uL (ref 4.0–10.5)
nRBC: 0 % (ref 0.0–0.2)

## 2022-02-16 LAB — C-REACTIVE PROTEIN: CRP: 1.8 mg/dL — ABNORMAL HIGH (ref <1.0)

## 2022-02-16 LAB — MAGNESIUM: Magnesium: 2.4 mg/dL (ref 1.7–2.4)

## 2022-02-16 SURGERY — RIGHT/LEFT HEART CATH AND CORONARY ANGIOGRAPHY
Anesthesia: LOCAL

## 2022-02-16 MED ORDER — ONDANSETRON HCL 4 MG/2ML IJ SOLN: 4.0000 mg | Freq: Four times a day (QID) | INTRAMUSCULAR | Status: DC | PRN

## 2022-02-16 MED ORDER — FENTANYL CITRATE (PF) 100 MCG/2ML IJ SOLN
INTRAMUSCULAR | Status: AC
  Filled 2022-02-16: qty 2

## 2022-02-16 MED ORDER — GLIMEPIRIDE 2 MG PO TABS
3.0000 mg | ORAL_TABLET | Freq: Every day | ORAL | Status: DC
  Administered 2022-02-17 – 2022-02-18 (×2): 3 mg via ORAL
  Filled 2022-02-16 (×4): qty 1

## 2022-02-16 MED ORDER — GLIMEPIRIDE 2 MG PO TABS
2.0000 mg | ORAL_TABLET | Freq: Every day | ORAL | Status: DC
Start: 2022-02-16 — End: 2022-02-16

## 2022-02-16 MED ORDER — SODIUM CHLORIDE 0.9 % IV SOLN: 250.0000 mL | INTRAVENOUS | Status: DC | PRN

## 2022-02-16 MED ORDER — HEPARIN (PORCINE) IN NACL 1000-0.9 UT/500ML-% IV SOLN
INTRAVENOUS | Status: DC | PRN
  Administered 2022-02-16 (×2): 500 mL

## 2022-02-16 MED ORDER — INSULIN ASPART 100 UNIT/ML IJ SOLN
0.0000 [IU] | Freq: Every day | INTRAMUSCULAR | Status: DC
  Administered 2022-02-16: 2 [IU] via SUBCUTANEOUS

## 2022-02-16 MED ORDER — LABETALOL HCL 5 MG/ML IV SOLN: 10.0000 mg | INTRAVENOUS | Status: AC | PRN

## 2022-02-16 MED ORDER — IOHEXOL 350 MG/ML SOLN
INTRAVENOUS | Status: DC | PRN
  Administered 2022-02-16: 35 mL via INTRA_ARTERIAL

## 2022-02-16 MED ORDER — SODIUM CHLORIDE 0.9 % IV SOLN: INTRAVENOUS | Status: AC

## 2022-02-16 MED ORDER — MIDAZOLAM HCL 2 MG/2ML IJ SOLN
INTRAMUSCULAR | Status: AC
  Filled 2022-02-16: qty 2

## 2022-02-16 MED ORDER — VERAPAMIL HCL 2.5 MG/ML IV SOLN
INTRAVENOUS | Status: AC
  Filled 2022-02-16: qty 2

## 2022-02-16 MED ORDER — ASPIRIN 81 MG PO CHEW: 81.0000 mg | CHEWABLE_TABLET | ORAL | Status: DC

## 2022-02-16 MED ORDER — HEPARIN SODIUM (PORCINE) 1000 UNIT/ML IJ SOLN
INTRAMUSCULAR | Status: DC | PRN
  Administered 2022-02-16: 5000 [IU] via INTRAVENOUS

## 2022-02-16 MED ORDER — SODIUM CHLORIDE 0.9% FLUSH
3.0000 mL | Freq: Two times a day (BID) | INTRAVENOUS | Status: DC
  Administered 2022-02-16 – 2022-02-21 (×11): 3 mL via INTRAVENOUS

## 2022-02-16 MED ORDER — INSULIN ASPART 100 UNIT/ML IJ SOLN
0.0000 [IU] | Freq: Three times a day (TID) | INTRAMUSCULAR | Status: DC
  Administered 2022-02-16 – 2022-02-17 (×2): 3 [IU] via SUBCUTANEOUS
  Administered 2022-02-17: 2 [IU] via SUBCUTANEOUS
  Administered 2022-02-18: 3 [IU] via SUBCUTANEOUS
  Administered 2022-02-19 – 2022-02-21 (×3): 2 [IU] via SUBCUTANEOUS

## 2022-02-16 MED ORDER — SODIUM CHLORIDE 0.9% FLUSH: 3.0000 mL | INTRAVENOUS | Status: DC | PRN

## 2022-02-16 MED ORDER — HEPARIN SODIUM (PORCINE) 1000 UNIT/ML IJ SOLN
INTRAMUSCULAR | Status: AC
  Filled 2022-02-16: qty 10

## 2022-02-16 MED ORDER — FENTANYL CITRATE (PF) 100 MCG/2ML IJ SOLN
INTRAMUSCULAR | Status: DC | PRN
  Administered 2022-02-16: 25 ug via INTRAVENOUS

## 2022-02-16 MED ORDER — IRBESARTAN 75 MG PO TABS: 75.0000 mg | ORAL_TABLET | Freq: Every day | ORAL | Status: DC

## 2022-02-16 MED ORDER — MIDAZOLAM HCL 2 MG/2ML IJ SOLN
INTRAMUSCULAR | Status: DC | PRN
  Administered 2022-02-16: 1 mg via INTRAVENOUS

## 2022-02-16 MED ORDER — FUROSEMIDE 40 MG PO TABS
40.0000 mg | ORAL_TABLET | Freq: Two times a day (BID) | ORAL | Status: DC
  Administered 2022-02-16: 40 mg via ORAL
  Filled 2022-02-16: qty 1

## 2022-02-16 MED ORDER — ISOSORBIDE MONONITRATE ER 30 MG PO TB24: 30.0000 mg | ORAL_TABLET | Freq: Every day | ORAL | Status: DC

## 2022-02-16 MED ORDER — SODIUM CHLORIDE 0.9 % IV SOLN: INTRAVENOUS | Status: DC

## 2022-02-16 MED ORDER — SODIUM CHLORIDE 0.9% FLUSH: 3.0000 mL | Freq: Two times a day (BID) | INTRAVENOUS | Status: DC

## 2022-02-16 MED ORDER — CARVEDILOL 12.5 MG PO TABS
12.5000 mg | ORAL_TABLET | Freq: Two times a day (BID) | ORAL | Status: DC
  Administered 2022-02-16 – 2022-02-17 (×2): 12.5 mg via ORAL
  Filled 2022-02-16 (×2): qty 1

## 2022-02-16 MED ORDER — VERAPAMIL HCL 2.5 MG/ML IV SOLN
INTRAVENOUS | Status: DC | PRN
  Administered 2022-02-16: 10 mL via INTRA_ARTERIAL

## 2022-02-16 MED ORDER — POTASSIUM CHLORIDE CRYS ER 20 MEQ PO TBCR
40.0000 meq | EXTENDED_RELEASE_TABLET | Freq: Once | ORAL | Status: AC
  Administered 2022-02-16: 40 meq via ORAL
  Filled 2022-02-16: qty 2

## 2022-02-16 MED ORDER — LIDOCAINE HCL (PF) 1 % IJ SOLN
INTRAMUSCULAR | Status: DC | PRN
  Administered 2022-02-16 (×2): 2 mL

## 2022-02-16 SURGICAL SUPPLY — 13 items
BAND ZEPHYR COMPRESS 30 LONG (HEMOSTASIS) ×1 IMPLANT
CATH 5FR JL3.5 JR4 ANG PIG MP (CATHETERS) ×1 IMPLANT
CATH BALLN WEDGE 5F 110CM (CATHETERS) ×1 IMPLANT
GLIDESHEATH SLEND SS 6F .021 (SHEATH) ×1 IMPLANT
GUIDEWIRE ANGLED .035X150CM (WIRE) ×1 IMPLANT
GUIDEWIRE INQWIRE 1.5J.035X260 (WIRE) IMPLANT
INQWIRE 1.5J .035X260CM (WIRE) ×2
KIT HEART LEFT (KITS) ×2 IMPLANT
PACK CARDIAC CATHETERIZATION (CUSTOM PROCEDURE TRAY) ×2 IMPLANT
SHEATH GLIDE SLENDER 4/5FR (SHEATH) ×1 IMPLANT
SYR MEDRAD MARK 7 150ML (SYRINGE) ×2 IMPLANT
TRANSDUCER W/STOPCOCK (MISCELLANEOUS) ×2 IMPLANT
TUBING CIL FLEX 10 FLL-RA (TUBING) ×2 IMPLANT

## 2022-02-16 NOTE — Progress Notes (Addendum)
Heart Failure Stewardship Pharmacist Progress Note   PCP: Patient, No Pcp Per (Inactive) PCP-Cardiologist: Jodelle Red, MD   HPI:  41 yo spanish speaking male with pmh LVH, HTN, hypertriglyceridemia presented after feeling sob. Oxygen sats found to be 70% with pulmonary edema on cxr. Pt did endorse a recent dx of pna, although unclear if he completed a course of abx.   02/13/2022 Chest X-ray showed stable to minimal improvement in extensive bilateral airspace process concerning for pneumonia or acute alveloar edema.   Per med rec patient has not been taking any of his home medications. Upon asking the patient he told me that he stopped taking them because he felt fine. I however, also suspect that cost is an issue.   Echo this admission (02/09/2022) with EF 30-35%, RV normal, mild MR. This is a new drop as 2019 echo with EF of 55-60%.   Patient with 22 beat run of VT 6/8. Chest X-ray this AM also showed no significant change in the appearance of bilateral interstitial and airspace opacities.   Plan for [facility] 02/16/2022 to further evaluate with patent coronary arteries with minimal luminal irregularity with low cardiac filling pressure (RA 5, WP 3) with borderline CO 4.2/CI 2.0, Papi 3.4.   Current HF Medications: Diuretic: furosemide 40 mg PO daily Beta Blocker: carvedilol 25 mg BID ACE/ARB/ARNI: irbesartan 75 mg daily (to start 6/9) MRA: none SGLT2i: Farxiga 10 mg daily  Prior to admission HF Medications: Diuretic: furosemide 40 mg daily Beta blocker: carvedilol 25 mg BID ACE/ARB/ARNI: lisinopril 20 mg BID Aldosterone Antagonist: spironolactone 50 mg daily SGLT2i: none Per med rec patient not taking any medications  Pertinent Lab Values: Serum creatinine 1.44, BUN 41, Potassium 3.9, Sodium 134, BNP 962, Magnesium 2.4, A1c 9.9%   Vital Signs: Weight: 228 lbs (admission weight: 285 lbs) Blood pressure: 100-116/80-95 Heart rate: 85  I/O: -4.6 L yesterday; net -19.7  L  Medication Assistance / Insurance Benefits Check: Does the patient have prescription insurance?  No Type of insurance plan: N/A  Does the patient qualify for medication assistance through manufacturers or grants?   Yes Eligible grants and/or patient assistance programs: Farxiga Medication assistance applications in progress: pending  Medication assistance applications approved: none Approved medication assistance renewals will be completed by: pending  Outpatient Pharmacy:  Prior to admission outpatient pharmacy: [facility] Is the patient willing to use [facility] TOC pharmacy at discharge? Yes Is the patient willing to transition their outpatient pharmacy to utilize a [facility] outpatient pharmacy?   Pending   Assessment: 1. Acute on chronic systolic CHF (LVEF 30-35%), due to HTN. NYHA class II symptoms. --Scr bumped 1.28 to 1.44, BUN 45, BUN/SCR 31:1 Patient dry per cath (RA 5, WP3). Agree to holding diuretics. -Patient evolemic on exam, but still requiring supplemental oxygen and new EF drop. Agree with R/LHC to further evaluate.  -Patient with 22 beat NSVT run this morning. K 3.9, Mg 2.4. Agree with 40 mEq Kcl given.  -HR 85 bpm, continue carvedilol 25 mg BID. HF goal dose 50 mg BID based on weight > 85 kg. Would not currently titrate further given CI of 2.0 on cath and evolemic. May need to consider ivabradine for additional HR control, however patient assistance for this medication will likely be an issue as he is not a citizen.  -BP controlled. With Scr bump agree with holding irbesartan today.  -Patient not currently on MRA. K 3.9 stable, with Scr bump will defer addition of spironolactone 25 mg daily for  now.  -A1c elevated at 9.9%, patient started on Farxiga 10 mg daily and further DM management per primary team. -Consider LiveVest with low EF and VT.   Plan: 1) Medication changes recommended at this time: -Continue carvedilol 25 mg BID  -Recommend referral to advanced HF  service  2) Patient assistance: -patient assistance for Farxiga completed 02/14/2022 -plan for Match at discharge  -can use 30 day free card for [facility] after Match if patient assistance application is still pending -Sherryll Burger is not an option as he is not a citizen and tax returns are required by the company for patient assistance   3)  Education  - To be completed prior to discharge  Drake Leach, PharmD, [facility] PGY2 Cardiology Pharmacy Resident

## 2022-02-16 NOTE — Progress Notes (Signed)
Progress Note  Patient Name: Justin [name] Date of Encounter: 02/16/2022  [facility] Cardiologist: Buford Dresser, MD   Subjective   Patient is feeling well this AM. Denies sob. Continues to have cough. Reports some chest soreness on the sides of his chest when he coughs.   Inpatient Medications    Scheduled Meds:  carvedilol  25 mg Oral BID WC   chlorhexidine  15 mL Mouth Rinse BID   Chlorhexidine Gluconate Cloth  6 each Topical Q0600   dapagliflozin propanediol  10 mg Oral Daily   furosemide  40 mg Oral BID   glimepiride  3 mg Oral Q breakfast   heparin injection (subcutaneous)  5,000 Units Subcutaneous Q8H   [START ON 02/17/2022] irbesartan  75 mg Oral Daily   mouth rinse  15 mL Mouth Rinse q12n4p   melatonin  3 mg Oral QHS   metFORMIN  500 mg Oral BID WC   nystatin   Topical BID   potassium chloride  40 mEq Oral Once   Continuous Infusions:  PRN Meds: acetaminophen, benzonatate, hydrALAZINE, ipratropium-albuterol   Vital Signs    Vitals:   02/15/22 1821 02/15/22 2300 02/16/22 0324 02/16/22 0753  BP: 103/71  (!) 141/100 (!) 116/95  Pulse: 83 80 80 87  Resp: 18  (!) 8   Temp: 98.5 F (36.9 C) 98.3 F (36.8 C) 98.3 F (36.8 C) 98.1 F (36.7 C)  TempSrc: Oral Oral Oral   SpO2: 92% 91% 91% 95%  Weight:      Height:        Intake/Output Summary (Last 24 hours) at 02/16/2022 0812 Last data filed at 02/15/2022 1821 Gross per 24 hour  Intake 960 ml  Output 1700 ml  Net -740 ml      02/15/2022    5:00 AM 02/09/2022    5:29 AM 08/23/2018    9:36 AM  Last 3 Weights  Weight (lbs) 228 lb 13.4 oz 285 lb 0.9 oz 285 lb  Weight (kg) 103.8 kg 129.3 kg 129.275 kg      Telemetry    Sinus rhythm, 22 beat run of V-tach overnight - Personally Reviewed  ECG    No new tracings since 6/3 - Personally Reviewed  Physical Exam   GEN: No acute distress. Sitting comfortably in the chair wearing nasal cannula  Neck: No JVD Cardiac: RRR, no murmurs, rubs,  or gallops. Radial pulses 2+ bilaterally  Respiratory: Fine crackles in left lung base, otherwise clear to ausculation bilaterally  GI: Soft, nontender, non-distended  MS: No edema; No deformity. Neuro:  Nonfocal  Psych: Normal affect   Labs    High Sensitivity Troponin:  No results for input(s): "TROPONINIHS" in the last 720 hours.   Chemistry Recent Labs  Lab 02/14/22 0115 02/15/22 0342 02/16/22 0414  NA 139 136 134*  K 4.4 4.4 3.9  CL 96* 93* 95*  CO2 32 33* 31  GLUCOSE 104* 118* 138*  BUN 45* 41* 45*  CREATININE 1.16 1.28* 1.44*  CALCIUM 9.2 9.4 9.7  MG 2.1 2.1 2.4  PROT 6.2* 6.3* 6.8  ALBUMIN 2.2* 2.4* 2.5*  AST 22 26 21   ALT 32 37 33  ALKPHOS 96 97 102  BILITOT 0.6 0.6 0.9  GFRNONAA >60 >60 >60  ANIONGAP 11 10 8     Lipids No results for input(s): "CHOL", "TRIG", "HDL", "LABVLDL", "LDLCALC", "CHOLHDL" in the last 168 hours.  Hematology Recent Labs  Lab 02/14/22 0115 02/15/22 0342 02/16/22 0414  WBC 11.6* 9.5  7.5  RBC 4.90 5.21 5.74  HGB 14.0 15.3 16.4  HCT 44.5 46.8 50.2  MCV 90.8 89.8 87.5  MCH 28.6 29.4 28.6  MCHC 31.5 32.7 32.7  RDW 13.2 13.3 13.7  PLT 474* 413* 432*   Thyroid No results for input(s): "TSH", "FREET4" in the last 168 hours.  BNP Recent Labs  Lab 02/13/22 0546 02/15/22 0342  BNP 962.4* 495.3*    DDimer No results for input(s): "DDIMER" in the last 168 hours.   Radiology    DG Chest Port 1 View  Result Date: 02/16/2022 CLINICAL DATA:  Acute hypoxic respiratory failure. EXAM: PORTABLE CHEST 1 VIEW COMPARISON:  02/14/2022 FINDINGS: Stable enlargement of the cardiac silhouette. Bilateral interstitial and airspace opacities are again noted and appear unchanged when compared with the previous exam. No signs of pneumothorax or pleural effusion. Visualized osseous structures are unremarkable. IMPRESSION: 1. No significant change in the appearance of bilateral interstitial and airspace opacities. Electronically Signed   By: Kerby Moors  M.D.   On: 02/16/2022 07:16    Cardiac Studies   Echocardiogram 02/09/22  1. Left ventricular ejection fraction, by estimation, is 30 to 35%. The  left ventricle has moderately decreased function. The left ventricle  demonstrates global hypokinesis. Left ventricular diastolic parameters are  indeterminate.   2. Right ventricular systolic function is normal. The right ventricular  size is normal.   3. The mitral valve is normal in structure. Mild to moderate mitral valve  regurgitation. No evidence of mitral stenosis.   4. The aortic valve is normal in structure. Aortic valve regurgitation is  not visualized. No aortic stenosis is present.   5. There is mild dilatation of the ascending aorta, measuring 38 mm.   6. The inferior vena cava is normal in size with greater than 50%  respiratory variability, suggesting right atrial pressure of 3 mmHg.   Patient Profile     41 y.o. male with a hx of systolic HF who is was seen for the evaluation of SOB at the request of Dr. Lamonte Sakai  Assessment & Plan    Acute on chronic systolic heart failure:    - Echocardiogram this admission showed EF 30-35%  (down from EF 55-60% in 2019)  - BNP 717 on admission, up to 962.4 on 6/5, now down to 495.3 - Has been on lasix 40 mg IV BID this admission>> he put out at least 3.3 L urine yesterday (I/Os incomplete) and is net -20 L since admission. Reportedly down 55 lbs  - Creatinine bumped to 1.44 this AM - Oxygen requirement is improving, down to 2 L via nasal cannula this AM but did require 7 L overnight  - Repeat CXR this AM showed no significant change in the appearance of bilateral interstitial and airspace opacities.  - As patient has a reduced EF of 30-35% (down from 55-60% in 2019), continues to require supplemental oxygen after diuresing 20 L, CXR has been unchanged with diuresis, and now has new VT, we will proceed with R/L heart Cath this afternoon.  - Patient now NPO. Diuretics held to preserve renal  function  - Continue carvedilol 25 mg BID, irbesartan 75 mg daily, farxiga 10 mg daily - Shared Decision Making/Informed Consent{ The risks [stroke (1 in 1000), death (1 in 1000), kidney failure [usually temporary] (1 in 500), bleeding (1 in 200), allergic reaction [possibly serious] (1 in 200)], benefits (diagnostic support and management of coronary artery disease) and alternatives of a cardiac catheterization were discussed in detail  with Mr. Fiume and he is willing to proceed.   V-Tach  - Per telemetry (reviewed by Dr. Radford Pax) patient had a 22 beat run of V-tach overnight on 6/7-6/8 - K 3.9, mag 2.4  - On maximum dose of Carvedilol - Given v-tach and newly reduced EF-- R/L heart cath today    Questionable atypical pneumonia  - Azithromycin per primary team    Hypertension:   -continue Irbesartan 75 mg daily and Carvedilol 25mg  BID - Imdur started per primary team    Diabetes mellitus:   -A1C is 9.9.   - Now on farxiga, appreciate pharmacy's assistance with obtaining financial assistance    CKD class II:  -Creat slightly up to 1.44 today - Holding diuretics    Mitral regurgitation:  -follow with annual echo       For questions or updates, please contact [facility] Please consult [email or web address] for contact info under        Signed, Margie Billet, PA-C  02/16/2022, 8:12 AM

## 2022-02-16 NOTE — Progress Notes (Addendum)
Consent signed by patient for RIGHT/LEFT HEART CATH AND CORONARY ANGIOGRAPHY (N/A) as a surgical intervention. This RN has witnessed the consent with a spanish interpretor by patient bedside. Ordering provider reviewed any questions patient may have concerning this procedure. Transport has now arrived to take patient to procedure.

## 2022-02-16 NOTE — Progress Notes (Signed)
PROGRESS NOTE                                                                                                                                                                                                             Patient Demographics:    Justin [name], is a 41 y.o. male, DOB - [birth year], DH:[id number]  Outpatient Primary MD for the patient is Patient, No Pcp Per (Inactive)    LOS - 7  Admit date - 02/09/2022    Chief Complaint  Patient presents with   Shortness of Breath       Brief Narrative (HPI from H&P)   41 year old man (Spanish-speaking) who presented to [facility] 6/[facility] for SOB. PMHx significant for HTN, LVH, hypertriglyceridemia. Oxygen sats found to be 70% with pulmonary edema on CXR initially required BiPAP was admitted by the pulmonary critical care team, was also seen by cardiology, he was stabilized and transferred to my service on 02/09/2022.   Subjective:   Patient in bed, appears comfortable, denies any headache, no fever, no chest pain or pressure, no shortness of breath , no abdominal pain. No new focal weakness.   Assessment  & Plan :    Acute hypoxic respiratory failure due to pulmonary edema and questionable atypical pneumonia. he was initially on BiPAP, has been diuresed and now on 2 L nasal cannula, cardiology on board, echo noted with EF 35% which is down from 55% in 2019, also some history of noncompliance with cardiologist, been adequately diuresed with IV Lasix now transition to oral Lasix, on max dose Coreg along with ARB, low-dose Imdur added.  Defer any further cardiology work-up to the cardiology team.  He has finished 4 days of antibiotics for possible pneumonia/bronchitis.  CRP now unremarkable.  No productive cough. Niccoli much improved likely discharge on 02/17/2022 if remains stable.  CKD stage IIIa.  Baseline creatinine around 1.3, monitor, avoid nephrotoxins.  Renal function and potassium  seem to have improved.  Acute on chronic systolic heart failure.  Kindly see #1 above.  Note in 2019 his EF was 55% which has dropped to 35% now.  Cardiology on board.  Management as a #1 above.  Had asymptomatic none sustained V. tach run night of 02/15/2022, 22 beats.  On max dose beta-blocker, stable electrolytes.  Have informed the cardiology team.  Likely outpatient follow-up with  appropriate medications and compliance.  CT chest revealing mediastinal lymphadenopathy.  Outpatient follow-up with pulmonary, needs repeat CT scan within 3 months.   History of hypertension.  Stable on current combination of Coreg, ARB and diuretic.  Mild asymptomatic transaminitis due to hepatic congestion.  Almost resolved, right upper quadrant ultrasound stable.  Morbid obesity.  BMI 47.  Outpatient follow-up with PCP for weight loss.  DM type II.  Received DM and insulin education, diabetic coordinator not confident that patient will be able to take insulin at home hence placed on metformin and Jardiance combination on 02/14/2022 sugars are still very high will add Amaryl, will need testing supplies and teaching education  Lab Results  Component Value Date   HGBA1C 9.9 (H) 02/10/2022   CBG (last 3)  Recent Labs    02/15/22 1208 02/15/22 1824 02/15/22 2149  GLUCAP 196* 298* 259*        Condition - Fair  Family Communication  :  None present  Code Status :  Full  Consults  :  Cards, PCCM  PUD Prophylaxis :    Procedures  :     CTA - Extensive patchy alveolar infiltrates are seen in both lungs suggesting possible multifocal pneumonia. There are no cavitary lesions in the lung fields. There are enlarged lymph nodes in the mediastinum, possibly suggesting reactive hyperplasia. Follow-up CT chest in 3 months should be considered to assess resolution and to rule out any neoplastic process in the lymph nodes. Part of this finding may suggest pulmonary edema. There is minimal right pleural effusion.  Heart is enlarged in size.   RUQ [place] - stable  TTE -  1. Left ventricular ejection fraction, by estimation, is 30 to 35%. The left ventricle has moderately decreased function. The left ventricle demonstrates global hypokinesis. Left ventricular diastolic parameters are indeterminate.  2. Right ventricular systolic function is normal. The right ventricular size is normal.  3. The mitral valve is normal in structure. Mild to moderate mitral valve regurgitation. No evidence of mitral stenosis.  4. The aortic valve is normal in structure. Aortic valve regurgitation is not visualized. No aortic stenosis is present.  5. There is mild dilatation of the ascending aorta, measuring 38 mm.  6. The inferior vena cava is normal in size with greater than 50% respiratory variability, suggesting right atrial pressure of 3 mmHg.      Disposition Plan  :    Status is: Inpatient  DVT Prophylaxis  :    heparin injection 5,000 Units Start: 02/09/22 1400    Lab Results  Component Value Date   PLT 432 (H) 02/16/2022    Diet :  Diet Order             Diet Carb Modified Fluid consistency: Thin; Room service appropriate? Yes with Assist; Fluid restriction: 1200 mL Fluid  Diet effective now                    Inpatient Medications  Scheduled Meds:  carvedilol  25 mg Oral BID WC   chlorhexidine  15 mL Mouth Rinse BID   Chlorhexidine Gluconate Cloth  6 each Topical Q0600   dapagliflozin propanediol  10 mg Oral Daily   furosemide  40 mg Oral BID   heparin injection (subcutaneous)  5,000 Units Subcutaneous Q8H   [START ON 02/17/2022] irbesartan  75 mg Oral Daily   isosorbide mononitrate  30 mg Oral Daily   mouth rinse  15 mL Mouth Rinse q12n4p  melatonin  3 mg Oral QHS   metFORMIN  500 mg Oral BID WC   nystatin   Topical BID   potassium chloride  40 mEq Oral Once   Continuous Infusions:   PRN Meds:.acetaminophen, benzonatate, hydrALAZINE, ipratropium-albuterol  Time Spent in minutes   30   Lala Lund M.D on 02/16/2022 at 7:47 AM  To page go to [email or web address]   [facility] -  Office  [phone]  See all Orders from today for further details    Objective:   Vitals:   02/15/22 1209 02/15/22 1821 02/15/22 2300 02/16/22 0324  BP: 100/78 103/71  (!) 141/100  Pulse: 84 83 80 80  Resp: 18 18  (!) 8  Temp: 98.4 F (36.9 C) 98.5 F (36.9 C) 98.3 F (36.8 C) 98.3 F (36.8 C)  TempSrc: Oral Oral Oral Oral  SpO2: 92% 92% 91% 91%  Weight:      Height:        Wt Readings from Last 3 Encounters:  02/15/22 103.8 kg  08/23/18 129.3 kg  05/30/18 123.1 kg     Intake/Output Summary (Last 24 hours) at 02/16/2022 0747 Last data filed at 02/15/2022 1821 Gross per 24 hour  Intake 960 ml  Output 1700 ml  Net -740 ml     Physical Exam  Awake Alert, No new F.N deficits, Normal affect [place].AT,PERRAL Supple Neck, No JVD,   Symmetrical Chest wall movement, Good air movement bilaterally, CTAB RRR,No Gallops, Rubs or new Murmurs,  +ve B.Sounds, Abd Soft, No tenderness,   No Cyanosis, Clubbing or edema    Data Review:    CBC Recent Labs  Lab 02/12/22 0151 02/13/22 0546 02/14/22 0115 02/15/22 0342 02/16/22 0414  WBC 11.9* 11.2* 11.6* 9.5 7.5  HGB 13.6 14.0 14.0 15.3 16.4  HCT 41.8 43.2 44.5 46.8 50.2  PLT 451* 429* 474* 413* 432*  MCV 89.9 89.8 90.8 89.8 87.5  MCH 29.2 29.1 28.6 29.4 28.6  MCHC 32.5 32.4 31.5 32.7 32.7  RDW 13.4 13.2 13.2 13.3 13.7  LYMPHSABS  --  0.6* 1.4 1.9 1.6  MONOABS  --  0.4 1.0 0.8 0.6  EOSABS  --  0.0 0.1 0.5 0.3  BASOSABS  --  0.0 0.0 0.0 0.0    Electrolytes Recent Labs  Lab 02/10/22 0102 02/11/22 2120 02/12/22 0151 02/13/22 0546 02/14/22 0115 02/14/22 1200 02/15/22 0342 02/16/22 0414  NA 138  --  135 136 139  --  136 134*  K 4.4  --  4.7 5.0 4.4  --  4.4 3.9  CL 103  --  100 96* 96*  --  93* 95*  CO2 26  --  27 32 32  --  33* 31  GLUCOSE 165*   < > 205* 231* 104*  --  118* 138*  BUN 26*  --  49* 47* 45*   --  41* 45*  CREATININE 1.19  --  1.29* 1.34* 1.16  --  1.28* 1.44*  CALCIUM 8.6*  --  8.8* 9.1 9.2  --  9.4 9.7  AST 34  --   --  20 22  --  26 21  ALT 49*  --   --  34 32  --  37 33  ALKPHOS 126  --   --  109 96  --  97 102  BILITOT 0.8  --   --  0.5 0.6  --  0.6 0.9  ALBUMIN 2.0*  --   --  2.3* 2.2*  --  2.4* 2.5*  MG 2.2  --   --  2.3 2.1  --  2.1 2.4  CRP  --   --   --   --   --  4.3* 2.5* 1.8*  HGBA1C 9.9*  --   --   --   --   --   --   --   BNP  --   --   --  962.4*  --   --  495.3*  --    < > = values in this interval not displayed.    ------------------------------------------------------------------------------------------------------------------ No results for input(s): "CHOL", "HDL", "LDLCALC", "TRIG", "CHOLHDL", "LDLDIRECT" in the last 72 hours.  Lab Results  Component Value Date   HGBA1C 9.9 (H) 02/10/2022      Radiology Reports DG Chest Port 1 View  Result Date: 02/16/2022 CLINICAL DATA:  Acute hypoxic respiratory failure. EXAM: PORTABLE CHEST 1 VIEW COMPARISON:  02/14/2022 FINDINGS: Stable enlargement of the cardiac silhouette. Bilateral interstitial and airspace opacities are again noted and appear unchanged when compared with the previous exam. No signs of pneumothorax or pleural effusion. Visualized osseous structures are unremarkable. IMPRESSION: 1. No significant change in the appearance of bilateral interstitial and airspace opacities. Electronically Signed   By: Kerby Moors M.D.   On: 02/16/2022 07:16   DG Chest Port 1 View  Result Date: 02/14/2022 CLINICAL DATA:  Left chest pain. EXAM: PORTABLE CHEST 1 VIEW COMPARISON:  Radiograph February 13, 2022 FINDINGS: Similar enlarged cardiac silhouette and central vascular prominence. No significant interval change in the severe bilateral interstitial and airspace opacities. Visible pleural effusion or pneumothorax. The visualized skeletal structures are unchanged. IMPRESSION: Similar enlarged cardiac silhouette and central  vascular prominence with severe bilateral interstitial and airspace opacities. Electronically Signed   By: Dahlia Bailiff M.D.   On: 02/14/2022 10:03   DG Chest 2 View  Result Date: 02/13/2022 CLINICAL DATA:  Cough, hypertension EXAM: CHEST - 2 VIEW COMPARISON:  02/10/2022 FINDINGS: Heart remains enlarged with similar to minimal improvement in the severe bilateral airspace disease nonspecific for bilateral pneumonia, or alveolar edema. No enlarging effusion or pneumothorax. Trachea midline. No acute osseous finding. IMPRESSION: Stable to minimal improvement in the extensive bilateral airspace process concerning for pneumonia or acute alveolar edema. Stable cardiomegaly Electronically Signed   By: Jerilynn Mages.  Shick M.D.   On: 02/13/2022 10:48

## 2022-02-16 NOTE — Interval H&P Note (Signed)
History and Physical Interval Note:  02/16/2022 1:26 PM  Justin [name]  has presented today for surgery, with the diagnosis of acute systolic heart failure.  The various methods of treatment have been discussed with the patient and family. After consideration of risks, benefits and other options for treatment, the patient has consented to  Procedure(s): RIGHT/LEFT HEART CATH AND CORONARY ANGIOGRAPHY (N/A) as a surgical intervention.  The patient's history has been reviewed, patient examined, no change in status, stable for surgery.  I have reviewed the patient's chart and labs.  Questions were answered to the patient's satisfaction.     Tonny Bollman

## 2022-02-16 NOTE — H&P (View-Only) (Signed)
Progress Note  Patient Name: Justin [name] Date of Encounter: 02/16/2022  [facility] Cardiologist: Buford Dresser, MD   Subjective   Patient is feeling well this AM. Denies sob. Continues to have cough. Reports some chest soreness on the sides of his chest when he coughs.   Inpatient Medications    Scheduled Meds:  carvedilol  25 mg Oral BID WC   chlorhexidine  15 mL Mouth Rinse BID   Chlorhexidine Gluconate Cloth  6 each Topical Q0600   dapagliflozin propanediol  10 mg Oral Daily   furosemide  40 mg Oral BID   glimepiride  3 mg Oral Q breakfast   heparin injection (subcutaneous)  5,000 Units Subcutaneous Q8H   [START ON 02/17/2022] irbesartan  75 mg Oral Daily   mouth rinse  15 mL Mouth Rinse q12n4p   melatonin  3 mg Oral QHS   metFORMIN  500 mg Oral BID WC   nystatin   Topical BID   potassium chloride  40 mEq Oral Once   Continuous Infusions:  PRN Meds: acetaminophen, benzonatate, hydrALAZINE, ipratropium-albuterol   Vital Signs    Vitals:   02/15/22 1821 02/15/22 2300 02/16/22 0324 02/16/22 0753  BP: 103/71  (!) 141/100 (!) 116/95  Pulse: 83 80 80 87  Resp: 18  (!) 8   Temp: 98.5 F (36.9 C) 98.3 F (36.8 C) 98.3 F (36.8 C) 98.1 F (36.7 C)  TempSrc: Oral Oral Oral   SpO2: 92% 91% 91% 95%  Weight:      Height:        Intake/Output Summary (Last 24 hours) at 02/16/2022 0812 Last data filed at 02/15/2022 1821 Gross per 24 hour  Intake 960 ml  Output 1700 ml  Net -740 ml      02/15/2022    5:00 AM 02/09/2022    5:29 AM 08/23/2018    9:36 AM  Last 3 Weights  Weight (lbs) 228 lb 13.4 oz 285 lb 0.9 oz 285 lb  Weight (kg) 103.8 kg 129.3 kg 129.275 kg      Telemetry    Sinus rhythm, 22 beat run of V-tach overnight - Personally Reviewed  ECG    No new tracings since 6/3 - Personally Reviewed  Physical Exam   GEN: No acute distress. Sitting comfortably in the chair wearing nasal cannula  Neck: No JVD Cardiac: RRR, no murmurs, rubs,  or gallops. Radial pulses 2+ bilaterally  Respiratory: Fine crackles in left lung base, otherwise clear to ausculation bilaterally  GI: Soft, nontender, non-distended  MS: No edema; No deformity. Neuro:  Nonfocal  Psych: Normal affect   Labs    High Sensitivity Troponin:  No results for input(s): "TROPONINIHS" in the last 720 hours.   Chemistry Recent Labs  Lab 02/14/22 0115 02/15/22 0342 02/16/22 0414  NA 139 136 134*  K 4.4 4.4 3.9  CL 96* 93* 95*  CO2 32 33* 31  GLUCOSE 104* 118* 138*  BUN 45* 41* 45*  CREATININE 1.16 1.28* 1.44*  CALCIUM 9.2 9.4 9.7  MG 2.1 2.1 2.4  PROT 6.2* 6.3* 6.8  ALBUMIN 2.2* 2.4* 2.5*  AST 22 26 21   ALT 32 37 33  ALKPHOS 96 97 102  BILITOT 0.6 0.6 0.9  GFRNONAA >60 >60 >60  ANIONGAP 11 10 8     Lipids No results for input(s): "CHOL", "TRIG", "HDL", "LABVLDL", "LDLCALC", "CHOLHDL" in the last 168 hours.  Hematology Recent Labs  Lab 02/14/22 0115 02/15/22 0342 02/16/22 0414  WBC 11.6* 9.5  7.5  RBC 4.90 5.21 5.74  HGB 14.0 15.3 16.4  HCT 44.5 46.8 50.2  MCV 90.8 89.8 87.5  MCH 28.6 29.4 28.6  MCHC 31.5 32.7 32.7  RDW 13.2 13.3 13.7  PLT 474* 413* 432*   Thyroid No results for input(s): "TSH", "FREET4" in the last 168 hours.  BNP Recent Labs  Lab 02/13/22 0546 02/15/22 0342  BNP 962.4* 495.3*    DDimer No results for input(s): "DDIMER" in the last 168 hours.   Radiology    DG Chest Port 1 View  Result Date: 02/16/2022 CLINICAL DATA:  Acute hypoxic respiratory failure. EXAM: PORTABLE CHEST 1 VIEW COMPARISON:  02/14/2022 FINDINGS: Stable enlargement of the cardiac silhouette. Bilateral interstitial and airspace opacities are again noted and appear unchanged when compared with the previous exam. No signs of pneumothorax or pleural effusion. Visualized osseous structures are unremarkable. IMPRESSION: 1. No significant change in the appearance of bilateral interstitial and airspace opacities. Electronically Signed   By: Kerby Moors  M.D.   On: 02/16/2022 07:16    Cardiac Studies   Echocardiogram 02/09/22  1. Left ventricular ejection fraction, by estimation, is 30 to 35%. The  left ventricle has moderately decreased function. The left ventricle  demonstrates global hypokinesis. Left ventricular diastolic parameters are  indeterminate.   2. Right ventricular systolic function is normal. The right ventricular  size is normal.   3. The mitral valve is normal in structure. Mild to moderate mitral valve  regurgitation. No evidence of mitral stenosis.   4. The aortic valve is normal in structure. Aortic valve regurgitation is  not visualized. No aortic stenosis is present.   5. There is mild dilatation of the ascending aorta, measuring 38 mm.   6. The inferior vena cava is normal in size with greater than 50%  respiratory variability, suggesting right atrial pressure of 3 mmHg.   Patient Profile     41 y.o. male with a hx of systolic HF who is was seen for the evaluation of SOB at the request of Dr. Lamonte Sakai  Assessment & Plan    Acute on chronic systolic heart failure:    - Echocardiogram this admission showed EF 30-35%  (down from EF 55-60% in 2019)  - BNP 717 on admission, up to 962.4 on 6/5, now down to 495.3 - Has been on lasix 40 mg IV BID this admission>> he put out at least 3.3 L urine yesterday (I/Os incomplete) and is net -20 L since admission. Reportedly down 55 lbs  - Creatinine bumped to 1.44 this AM - Oxygen requirement is improving, down to 2 L via nasal cannula this AM but did require 7 L overnight  - Repeat CXR this AM showed no significant change in the appearance of bilateral interstitial and airspace opacities.  - As patient has a reduced EF of 30-35% (down from 55-60% in 2019), continues to require supplemental oxygen after diuresing 20 L, CXR has been unchanged with diuresis, and now has new VT, we will proceed with R/L heart Cath this afternoon.  - Patient now NPO. Diuretics held to preserve renal  function  - Continue carvedilol 25 mg BID, irbesartan 75 mg daily, farxiga 10 mg daily - Shared Decision Making/Informed Consent{ The risks [stroke (1 in 1000), death (1 in 1000), kidney failure [usually temporary] (1 in 500), bleeding (1 in 200), allergic reaction [possibly serious] (1 in 200)], benefits (diagnostic support and management of coronary artery disease) and alternatives of a cardiac catheterization were discussed in detail  with Mr. Ridenhour and he is willing to proceed.   V-Tach  - Per telemetry (reviewed by Dr. Radford Pax) patient had a 22 beat run of V-tach overnight on 6/7-6/8 - K 3.9, mag 2.4  - On maximum dose of Carvedilol - Given v-tach and newly reduced EF-- R/L heart cath today    Questionable atypical pneumonia  - Azithromycin per primary team    Hypertension:   -continue Irbesartan 75 mg daily and Carvedilol 25mg  BID - Imdur started per primary team    Diabetes mellitus:   -A1C is 9.9.   - Now on farxiga, appreciate pharmacy's assistance with obtaining financial assistance    CKD class II:  -Creat slightly up to 1.44 today - Holding diuretics    Mitral regurgitation:  -follow with annual echo       For questions or updates, please contact [facility] Please consult [email or web address] for contact info under        Signed, Margie Billet, PA-C  02/16/2022, 8:12 AM

## 2022-02-16 NOTE — Progress Notes (Signed)
Inpatient Diabetes Program Recommendations  AACE/ADA: New Consensus Statement on Inpatient Glycemic Control (2015)  Target Ranges:  Prepandial:   less than 140 mg/dL      Peak postprandial:   less than 180 mg/dL (1-2 hours)      Critically ill patients:  140 - 180 mg/dL   Lab Results  Component Value Date   GLUCAP 239 (H) 02/16/2022   HGBA1C 9.9 (H) 02/10/2022    Review of Glycemic Control  Latest Reference Range & Units 02/15/22 12:08 02/15/22 18:24 02/15/22 21:49 02/16/22 09:05  Glucose-Capillary 70 - 99 mg/dL 196 (H) 298 (H) 259 (H) 239 (H)  (H): Data is abnormally high Diabetes history: DM 2-New diagnosis Outpatient Diabetes medications:  None Current orders for Inpatient glycemic control:  Farxiga 10 mg QD, Amaryl 3 mg QD   Inpatient Diabetes Program Recommendations:    Spoke with patient again regarding concepts discussed previously with DM coordinator. Unable to remember details from education.  Reviewed patient's current A1c of 9.9%. Explained what a A1c is and what it measures. Also reviewed goal A1c with patient, importance of good glucose control @ home, and blood sugar goals. Reviewed survival skills, interventions, risk factors, Relion 70/30, vascular changes and commorbidities. Reviewed meter and supplies. Discussed frequency and when to call MD. Patient able to state when to call MD. Educated patient on insulin pen use at home. Reviewed contents of insulin flexpen starter kit. Reviewed all steps if insulin pen including attachment of needle, 2-unit air shot, dialing up dose, giving injection, removing needle, disposal of sharps, storage of unused insulin, disposal of insulin etc. Patient able to provide successful return demonstration. Also reviewed troubleshooting with insulin pen. MD to give patient Rxs for insulin pens and insulin pen needles. Discussed how to obtain supplies through [facility], cost and when to perform injections. Secure chat sent to Dr Candiss Norse to discuss  recommendations at discharge for 70/30 and noted Metformin was discontinued.  Patient plans to follow up with PCP. RN to continue reinforcing at bedside and performing self injections.   Thanks, Bronson Curb, MSN, RNC-OB Diabetes Coordinator [phone] (8a-5p)

## 2022-02-16 NOTE — Progress Notes (Signed)
Patient had 22 beat run of v-tach. Sheran Luz informed. Mag and BNP orders put in.

## 2022-02-17 ENCOUNTER — Inpatient Hospital Stay (HOSPITAL_COMMUNITY): Payer: Self-pay

## 2022-02-17 ENCOUNTER — Encounter (HOSPITAL_COMMUNITY): Payer: Self-pay | Admitting: Cardiovascular Disease

## 2022-02-17 DIAGNOSIS — I4729 Other ventricular tachycardia: Secondary | ICD-10-CM

## 2022-02-17 LAB — BASIC METABOLIC PANEL
Anion gap: 11 (ref 5–15)
BUN: 44 mg/dL — ABNORMAL HIGH (ref 6–20)
CO2: 27 mmol/L (ref 22–32)
Calcium: 9.4 mg/dL (ref 8.9–10.3)
Chloride: 97 mmol/L — ABNORMAL LOW (ref 98–111)
Creatinine, Ser: 1.54 mg/dL — ABNORMAL HIGH (ref 0.61–1.24)
GFR, Estimated: 57 mL/min — ABNORMAL LOW (ref >60)
Glucose, Bld: 112 mg/dL — ABNORMAL HIGH (ref 70–99)
Potassium: 4.7 mmol/L (ref 3.5–5.1)
Sodium: 135 mmol/L (ref 135–145)

## 2022-02-17 LAB — CBC
HCT: 48.2 % (ref 39.0–52.0)
Hemoglobin: 16 g/dL (ref 13.0–17.0)
MCH: 29.3 pg (ref 26.0–34.0)
MCHC: 33.2 g/dL (ref 30.0–36.0)
MCV: 88.3 fL (ref 80.0–100.0)
Platelets: 393 10*3/uL (ref 150–400)
RBC: 5.46 MIL/uL (ref 4.22–5.81)
RDW: 13.6 % (ref 11.5–15.5)
WBC: 9.8 10*3/uL (ref 4.0–10.5)
nRBC: 0 % (ref 0.0–0.2)

## 2022-02-17 LAB — GLUCOSE, CAPILLARY
Glucose-Capillary: 154 mg/dL — ABNORMAL HIGH (ref 70–99)
Glucose-Capillary: 125 mg/dL — ABNORMAL HIGH (ref 70–99)
Glucose-Capillary: 111 mg/dL — ABNORMAL HIGH (ref 70–99)
Glucose-Capillary: 114 mg/dL — ABNORMAL HIGH (ref 70–99)

## 2022-02-17 LAB — MAGNESIUM: Magnesium: 2.2 mg/dL (ref 1.7–2.4)

## 2022-02-17 LAB — BRAIN NATRIURETIC PEPTIDE: B Natriuretic Peptide: 193.8 pg/mL — ABNORMAL HIGH (ref 0.0–100.0)

## 2022-02-17 MED ORDER — LACTATED RINGERS IV SOLN: INTRAVENOUS | Status: AC

## 2022-02-17 MED ORDER — CARVEDILOL 25 MG PO TABS
25.0000 mg | ORAL_TABLET | Freq: Two times a day (BID) | ORAL | Status: DC
  Administered 2022-02-17 – 2022-02-21 (×8): 25 mg via ORAL
  Filled 2022-02-17 (×8): qty 1

## 2022-02-17 MED ORDER — DAPAGLIFLOZIN PROPANEDIOL 10 MG PO TABS
10.0000 mg | ORAL_TABLET | Freq: Every day | ORAL | Status: DC
  Administered 2022-02-20 – 2022-02-21 (×2): 10 mg via ORAL
  Filled 2022-02-17 (×2): qty 1

## 2022-02-17 MED ORDER — HYDRALAZINE HCL 25 MG PO TABS
25.0000 mg | ORAL_TABLET | Freq: Three times a day (TID) | ORAL | Status: DC
  Administered 2022-02-17 – 2022-02-18 (×4): 25 mg via ORAL
  Filled 2022-02-17 (×4): qty 1

## 2022-02-17 NOTE — Progress Notes (Signed)
PROGRESS NOTE                                                                                                                                                                                                             Patient Demographics:    Justin [name], is a 41 y.o. male, DOB - 01/13/1981, [id number]  Outpatient Primary MD for the patient is Patient, No Pcp Per (Inactive)    LOS - 8  Admit date - 02/09/2022    Chief Complaint  Patient presents with   Shortness of Breath       Brief Narrative (HPI from H&P)   41 year old man (Spanish-speaking) who presented to [facility] 6/1 for SOB. PMHx significant for HTN, LVH, hypertriglyceridemia. Oxygen sats found to be 70% with pulmonary edema on CXR initially required BiPAP was admitted by the pulmonary critical care team, was also seen by cardiology, he was stabilized and transferred to my service on 02/09/2022.   Subjective:   Patient in bed, appears comfortable, denies any headache, no fever, no chest pain or pressure, no shortness of breath , no abdominal pain. No new focal weakness.   Assessment  & Plan :    Acute hypoxic respiratory failure due to pulmonary edema and questionable atypical pneumonia. he was initially on BiPAP, has been diuresed and now on 2 L nasal cannula, cardiology on board, echo noted with EF 35% which is down from 55% in 2019, also some history of noncompliance with cardiologist, he has been adequately diuresed, he is also completed his pneumonia treatment, currently on medical treatment with beta-blocker, hydralazine, now clinically dehydrated so gentle IV fluids on 02/17/2022, ACE/ARB/Entresto held due to AKI, skip Jardiance till renal function.  Underwent left heart catheterization on 02/16/2022 which was unremarkable with low filling pressures.  Acute on chronic systolic heart failure.  Kindly see #1 above.  .  AKI on CKD stage IIIa.  Baseline creatinine  around 1.3, monitor, is dehydrated hold further diuretics and nephrotoxins gentle hydration on 02/17/2022 and monitor.  CT chest revealing mediastinal lymphadenopathy.  Outpatient follow-up with pulmonary, needs repeat CT scan within 3 months.   History of hypertension.  Stable on current combination of Coreg, ARB and diuretic.  Mild asymptomatic transaminitis due to hepatic congestion.  Almost resolved, right upper quadrant ultrasound stable.  Morbid obesity.  BMI 47.  Outpatient  follow-up with PCP for weight loss.  DM type II.  Received DM and insulin education, diabetic coordinator not confident that patient will be able to take insulin at home hence placed on metformin and Jardiance combination on 02/14/2022 sugars are still very high will add Amaryl, will need testing supplies and teaching education  Lab Results  Component Value Date   HGBA1C 9.9 (H) 02/10/2022   CBG (last 3)  Recent Labs    02/16/22 1611 02/16/22 2133 02/17/22 0817  GLUCAP 168* 229* 154*        Condition - Fair  Family Communication  :  None present  Code Status :  Full  Consults  :  Cards, PCCM  PUD Prophylaxis :    Procedures  :     Left heart catheterization by Dr. Excell Seltzer 02/16/2022.    Patent coronary arteries with minimal luminal irregularity Low cardiac filling pressures (PCWP mean of 3 mmHg) with borderline cardiac output (CO 4.2 L/m and CI 2.0)   CTA - Extensive patchy alveolar infiltrates are seen in both lungs suggesting possible multifocal pneumonia. There are no cavitary lesions in the lung fields. There are enlarged lymph nodes in the mediastinum, possibly suggesting reactive hyperplasia. Follow-up CT chest in 3 months should be considered to assess resolution and to rule out any neoplastic process in the lymph nodes. Part of this finding may suggest pulmonary edema. There is minimal right pleural effusion. Heart is enlarged in size.   RUQ [place] - stable  TTE -  1. Left ventricular ejection  fraction, by estimation, is 30 to 35%. The left ventricle has moderately decreased function. The left ventricle demonstrates global hypokinesis. Left ventricular diastolic parameters are indeterminate.  2. Right ventricular systolic function is normal. The right ventricular size is normal.  3. The mitral valve is normal in structure. Mild to moderate mitral valve regurgitation. No evidence of mitral stenosis.  4. The aortic valve is normal in structure. Aortic valve regurgitation is not visualized. No aortic stenosis is present.  5. There is mild dilatation of the ascending aorta, measuring 38 mm.  6. The inferior vena cava is normal in size with greater than 50% respiratory variability, suggesting right atrial pressure of 3 mmHg.      Disposition Plan  :    Status is: Inpatient  DVT Prophylaxis  :    heparin injection 5,000 Units Start: 02/09/22 1400    Lab Results  Component Value Date   PLT 393 02/17/2022    Diet :  Diet Order             Diet Carb Modified Fluid consistency: Thin; Room service appropriate? Yes  Diet effective now                    Inpatient Medications  Scheduled Meds:  carvedilol  25 mg Oral BID WC   chlorhexidine  15 mL Mouth Rinse BID   [START ON 02/20/2022] dapagliflozin propanediol  10 mg Oral Daily   glimepiride  3 mg Oral Q breakfast   heparin injection (subcutaneous)  5,000 Units Subcutaneous Q8H   hydrALAZINE  25 mg Oral Q8H   insulin aspart  0-15 Units Subcutaneous TID WC   insulin aspart  0-5 Units Subcutaneous QHS   mouth rinse  15 mL Mouth Rinse q12n4p   melatonin  3 mg Oral QHS   nystatin   Topical BID   sodium chloride flush  3 mL Intravenous Q12H   Continuous Infusions:  sodium chloride  lactated ringers      PRN Meds:.sodium chloride, acetaminophen, benzonatate, hydrALAZINE, ipratropium-albuterol, ondansetron (ZOFRAN) IV  Time Spent in minutes  30   Lala Lund M.D on 02/17/2022 at 8:50 AM  To page go to  [email or web address]   [facility] -  Office  [phone]  See all Orders from today for further details    Objective:   Vitals:   02/16/22 2006 02/17/22 0506 02/17/22 0700 02/17/22 0828  BP: 106/69 120/87  (!) 141/99  Pulse: 86 75 78 80  Resp:  (!) 22 (!) 21   Temp: 98.4 F (36.9 C) 98.4 F (36.9 C)    TempSrc: Oral Oral    SpO2: 93%  97%   Weight:  95.7 kg    Height:        Wt Readings from Last 3 Encounters:  02/17/22 95.7 kg  08/23/18 129.3 kg  05/30/18 123.1 kg     Intake/Output Summary (Last 24 hours) at 02/17/2022 0850 Last data filed at 02/17/2022 [id number] Gross per 24 hour  Intake --  Output 1075 ml  Net -1075 ml     Physical Exam  Awake Alert, No new F.N deficits, Normal affect [place].AT,PERRAL Supple Neck, No JVD,   Symmetrical Chest wall movement, Good air movement bilaterally, CTAB RRR,No Gallops, Rubs or new Murmurs,  +ve B.Sounds, Abd Soft, No tenderness,   No Cyanosis, Clubbing or edema     Data Review:    CBC Recent Labs  Lab 02/13/22 0546 02/14/22 0115 02/15/22 0342 02/16/22 0414 02/16/22 1425 02/16/22 1433 02/17/22 0611  WBC 11.2* 11.6* 9.5 7.5  --   --  9.8  HGB 14.0 14.0 15.3 16.4 17.0 17.0 16.0  HCT 43.2 44.5 46.8 50.2 50.0 50.0 48.2  PLT 429* 474* 413* 432*  --   --  393  MCV 89.8 90.8 89.8 87.5  --   --  88.3  MCH 29.1 28.6 29.4 28.6  --   --  29.3  MCHC 32.4 31.5 32.7 32.7  --   --  33.2  RDW 13.2 13.2 13.3 13.7  --   --  13.6  LYMPHSABS 0.6* 1.4 1.9 1.6  --   --   --   MONOABS 0.4 1.0 0.8 0.6  --   --   --   EOSABS 0.0 0.1 0.5 0.3  --   --   --   BASOSABS 0.0 0.0 0.0 0.0  --   --   --     Electrolytes Recent Labs  Lab 02/13/22 0546 02/14/22 0115 02/14/22 1200 02/15/22 0342 02/16/22 0414 02/16/22 1425 02/16/22 1433 02/17/22 0611  NA 136 139  --  136 134* 133* 135 135  K 5.0 4.4  --  4.4 3.9 4.3 4.1 4.7  CL 96* 96*  --  93* 95*  --   --  97*  CO2 32 32  --  33* 31  --   --  27  GLUCOSE 231* 104*  --  118* 138*   --   --  112*  BUN 47* 45*  --  41* 45*  --   --  44*  CREATININE 1.34* 1.16  --  1.28* 1.44*  --   --  1.54*  CALCIUM 9.1 9.2  --  9.4 9.7  --   --  9.4  AST 20 22  --  26 21  --   --   --   ALT 34 32  --  37 33  --   --   --  ALKPHOS 109 96  --  97 102  --   --   --   BILITOT 0.5 0.6  --  0.6 0.9  --   --   --   ALBUMIN 2.3* 2.2*  --  2.4* 2.5*  --   --   --   MG 2.3 2.1  --  2.1 2.4  --   --  2.2  CRP  --   --  4.3* 2.5* 1.8*  --   --   --   BNP 962.4*  --   --  495.3*  --   --   --  193.8*    ------------------------------------------------------------------------------------------------------------------ No results for input(s): "CHOL", "HDL", "LDLCALC", "TRIG", "CHOLHDL", "LDLDIRECT" in the last 72 hours.  Lab Results  Component Value Date   HGBA1C 9.9 (H) 02/10/2022      Radiology Reports CARDIAC CATHETERIZATION  Result Date: 02/16/2022 Patent coronary arteries with minimal luminal irregularity Low cardiac filling pressures (PCWP mean of 3 mmHg) with borderline cardiac output (CO 4.2 L/m and CI 2.0) Recommend: medical therapy, would decrease or stop diuretics defer to cardiology rounding team   DG Chest Port 1 View  Result Date: 02/16/2022 CLINICAL DATA:  Acute hypoxic respiratory failure. EXAM: PORTABLE CHEST 1 VIEW COMPARISON:  02/14/2022 FINDINGS: Stable enlargement of the cardiac silhouette. Bilateral interstitial and airspace opacities are again noted and appear unchanged when compared with the previous exam. No signs of pneumothorax or pleural effusion. Visualized osseous structures are unremarkable. IMPRESSION: 1. No significant change in the appearance of bilateral interstitial and airspace opacities. Electronically Signed   By: Kerby Moors M.D.   On: 02/16/2022 07:16   DG Chest Port 1 View  Result Date: 02/14/2022 CLINICAL DATA:  Left chest pain. EXAM: PORTABLE CHEST 1 VIEW COMPARISON:  Radiograph February 13, 2022 FINDINGS: Similar enlarged cardiac silhouette and central  vascular prominence. No significant interval change in the severe bilateral interstitial and airspace opacities. Visible pleural effusion or pneumothorax. The visualized skeletal structures are unchanged. IMPRESSION: Similar enlarged cardiac silhouette and central vascular prominence with severe bilateral interstitial and airspace opacities. Electronically Signed   By: Dahlia Bailiff M.D.   On: 02/14/2022 10:03   DG Chest 2 View  Result Date: 02/13/2022 CLINICAL DATA:  Cough, hypertension EXAM: CHEST - 2 VIEW COMPARISON:  02/10/2022 FINDINGS: Heart remains enlarged with similar to minimal improvement in the severe bilateral airspace disease nonspecific for bilateral pneumonia, or alveolar edema. No enlarging effusion or pneumothorax. Trachea midline. No acute osseous finding. IMPRESSION: Stable to minimal improvement in the extensive bilateral airspace process concerning for pneumonia or acute alveolar edema. Stable cardiomegaly Electronically Signed   By: Jerilynn Mages.  Shick M.D.   On: 02/13/2022 10:48

## 2022-02-17 NOTE — Progress Notes (Signed)
Mobility Specialist Progress Note    02/17/22 1723  Mobility  Activity Ambulated independently in hallway  Level of Assistance Standby assist, set-up cues, supervision of patient - no hands on  Assistive Device None  Distance Ambulated (ft) 450 ft  Activity Response Tolerated well  $Mobility charge 1 Mobility   Pre-Mobility: 86 HR, 95% SpO2 During Mobility: >/=90% SpO2 Post-Mobility: 91 HR, 90% SpO2  Pt received in bed and agreeable. No complaints on walk. Able to maintain SpO2 >/=90% throughout. Returned to chair with call bell in reach.    [place] Mobility Specialist

## 2022-02-17 NOTE — Progress Notes (Signed)
Progress Note  Patient Name: Justin [name] Date of Encounter: 02/17/2022  Justin [name] Cardiologist: Buford Dresser, MD   Subjective   Patient overall is feeling well. He denies CP or SOB, still on supplemental O2. Cardiac cath was discussed. Cath site, right radial, is stable.  Inpatient Medications    Scheduled Meds:  carvedilol  12.5 mg Oral BID WC   chlorhexidine  15 mL Mouth Rinse BID   dapagliflozin propanediol  10 mg Oral Daily   glimepiride  3 mg Oral Q breakfast   heparin injection (subcutaneous)  5,000 Units Subcutaneous Q8H   insulin aspart  0-15 Units Subcutaneous TID WC   insulin aspart  0-5 Units Subcutaneous QHS   mouth rinse  15 mL Mouth Rinse q12n4p   melatonin  3 mg Oral QHS   nystatin   Topical BID   sodium chloride flush  3 mL Intravenous Q12H   sodium chloride flush  3 mL Intravenous Q12H   Continuous Infusions:  sodium chloride     PRN Meds: sodium chloride, acetaminophen, benzonatate, hydrALAZINE, ipratropium-albuterol, ondansetron (ZOFRAN) IV, sodium chloride flush   Vital Signs    Vitals:   02/16/22 1923 02/16/22 2006 02/17/22 0506 02/17/22 0700  BP:  106/69 120/87   Pulse: 85 86 75 78  Resp: 20  (!) 22 (!) 21  Temp:  98.4 F (36.9 C) 98.4 F (36.9 C)   TempSrc:  Oral Oral   SpO2: 92% 93%  97%  Weight:   95.7 kg   Height:        Intake/Output Summary (Last 24 hours) at 02/17/2022 0740 Last data filed at 02/17/2022 [id number] Gross per 24 hour  Intake --  Output 1075 ml  Net -1075 ml      02/17/2022    5:06 AM 02/15/2022    5:00 AM 02/09/2022    5:29 AM  Last 3 Weights  Weight (lbs) 211 lb 228 lb 13.4 oz 285 lb 0.9 oz  Weight (kg) 95.709 kg 103.8 kg 129.3 kg      Telemetry    NSR, HR 70s, no further VT noted - Personally Reviewed  ECG    No new - Personally Reviewed  Physical Exam   GEN: No acute distress.   Neck: No JVD Cardiac: RRR, no murmurs, rubs, or gallops.  Respiratory: Clear to auscultation  bilaterally. GI: Soft, nontender, non-distended  MS: No edema; No deformity. Neuro:  Nonfocal  Psych: Normal affect   Labs    High Sensitivity Troponin:  No results for input(s): "TROPONINIHS" in the last 720 hours.   Chemistry Recent Labs  Lab 02/14/22 0115 02/15/22 0342 02/16/22 0414 02/16/22 1425 02/16/22 1433 02/17/22 0611  NA 139 136 134* 133* 135 135  K 4.4 4.4 3.9 4.3 4.1 4.7  CL 96* 93* 95*  --   --  97*  CO2 32 33* 31  --   --  27  GLUCOSE 104* 118* 138*  --   --  112*  BUN 45* 41* 45*  --   --  44*  CREATININE 1.16 1.28* 1.44*  --   --  1.54*  CALCIUM 9.2 9.4 9.7  --   --  9.4  MG 2.1 2.1 2.4  --   --  2.2  PROT 6.2* 6.3* 6.8  --   --   --   ALBUMIN 2.2* 2.4* 2.5*  --   --   --   AST 22 26 21   --   --   --  ALT 32 37 33  --   --   --   ALKPHOS 96 97 102  --   --   --   BILITOT 0.6 0.6 0.9  --   --   --   GFRNONAA >60 >60 >60  --   --  57*  ANIONGAP 11 10 8   --   --  11    Lipids No results for input(s): "CHOL", "TRIG", "HDL", "LABVLDL", "LDLCALC", "CHOLHDL" in the last 168 hours.  Hematology Recent Labs  Lab 02/15/22 0342 02/16/22 0414 02/16/22 1425 02/16/22 1433 02/17/22 0611  WBC 9.5 7.5  --   --  9.8  RBC 5.21 5.74  --   --  5.46  HGB 15.3 16.4 17.0 17.0 16.0  HCT 46.8 50.2 50.0 50.0 48.2  MCV 89.8 87.5  --   --  88.3  MCH 29.4 28.6  --   --  29.3  MCHC 32.7 32.7  --   --  33.2  RDW 13.3 13.7  --   --  13.6  PLT 413* 432*  --   --  393   Thyroid No results for input(s): "TSH", "FREET4" in the last 168 hours.  BNP Recent Labs  Lab 02/13/22 0546 02/15/22 0342  BNP 962.4* 495.3*    DDimer No results for input(s): "DDIMER" in the last 168 hours.   Radiology    CARDIAC CATHETERIZATION  Result Date: 02/16/2022 Patent coronary arteries with minimal luminal irregularity Low cardiac filling pressures (PCWP mean of 3 mmHg) with borderline cardiac output (CO 4.2 L/m and CI 2.0) Recommend: medical therapy, would decrease or stop diuretics defer to  cardiology rounding team   DG Chest Port 1 View  Result Date: 02/16/2022 CLINICAL DATA:  Acute hypoxic respiratory failure. EXAM: PORTABLE CHEST 1 VIEW COMPARISON:  02/14/2022 FINDINGS: Stable enlargement of the cardiac silhouette. Bilateral interstitial and airspace opacities are again noted and appear unchanged when compared with the previous exam. No signs of pneumothorax or pleural effusion. Visualized osseous structures are unremarkable. IMPRESSION: 1. No significant change in the appearance of bilateral interstitial and airspace opacities. Electronically Signed   By: Kerby Moors M.D.   On: 02/16/2022 07:16    Cardiac Studies   R/L heart cath 02/16/22 Patent coronary arteries with minimal luminal irregularity Low cardiac filling pressures (PCWP mean of 3 mmHg) with borderline cardiac output (CO 4.2 L/m and CI 2.0)   Recommend: medical therapy, would decrease or stop diuretics defer to cardiology rounding team  Echocardiogram 02/09/22  1. Left ventricular ejection fraction, by estimation, is 30 to 35%. The  left ventricle has moderately decreased function. The left ventricle  demonstrates global hypokinesis. Left ventricular diastolic parameters are  indeterminate.   2. Right ventricular systolic function is normal. The right ventricular  size is normal.   3. The mitral valve is normal in structure. Mild to moderate mitral valve  regurgitation. No evidence of mitral stenosis.   4. The aortic valve is normal in structure. Aortic valve regurgitation is  not visualized. No aortic stenosis is present.   5. There is mild dilatation of the ascending aorta, measuring 38 mm.   6. The inferior vena cava is normal in size with greater than 50%  respiratory variability, suggesting right atrial pressure of 3 mmHg.   Patient Profile     41 y.o. male with a hx of systolic HF who is was seen for the evaluation of SOB.  Assessment & Plan    Acute on chronic systolic  heart failure NICM - Echo  this admission showed reduced LVEF 30-35%, down from EF 55-60% in 2019 - BNP 717>495 - patient underwent R/L heart cath that showed patent coronary arteries and low cardiac filing pressures with borderline cardiac output. Recommendation to stop diuretics. - patient was on IV lasix 40mg  BID>>held for rise in creatinine and cath showed no volume overload as above - UOP Net -20.8L - Scr mildly up today - Wean O2 - Continue Farxiga 10mg  daily, Coreg 12.5mg  BID. Continue GDTM as tolerated. Plan to re-check an echo after 2-3 months on GDMT. CHF eduction discussed.  Vtach - 22 beat run of Vtach overnight 6/8 - Cath with normal cors - continue Coreg 12.5mg  BID - no further VT noted  PNA - abx per IM - wean O2 as able  HTN - Coreg 12.5mg  BID  CKD stage 2 - Scr 1.44>1.54 - continue to monitor, baseline around 1.2-1.3  For questions or updates, please contact [place] [name] Please consult [email or web address] for contact info under        Signed, Kishaun Erekson Ninfa Meeker, PA-C  02/17/2022, 7:40 AM

## 2022-02-17 NOTE — Progress Notes (Signed)
Heart Failure Stewardship Pharmacist Progress Note   PCP: Patient, No Pcp Per (Inactive) PCP-Cardiologist: Jodelle Red, MD   HPI:  41 yo spanish speaking male with pmh LVH, HTN, hypertriglyceridemia presented after feeling sob. Oxygen sats found to be 70% with pulmonary edema on cxr. Pt did endorse a recent dx of pna, although unclear if he completed a course of abx.   02/13/2022 Chest X-ray showed stable to minimal improvement in extensive bilateral airspace process concerning for pneumonia or acute alveloar edema.   Per med rec patient has not been taking any of his home medications. Upon asking the patient he told me that he stopped taking them because he felt fine. I however, also suspect that cost is an issue.   Echo this admission (02/09/2022) with EF 30-35%, RV normal, mild MR. This is a new drop as 2019 echo with EF of 55-60%.   Patient with 22 beat run of VT 6/8. Chest X-ray this AM also showed no significant change in the appearance of bilateral interstitial and airspace opacities.   R/LHC 02/16/2022 with patent coronary arteries with minimal luminal irregularity with low cardiac filling pressure (RA 5, WP 3) with borderline CO 4.2/CI 2.0, Papi 3.4.   EP and pulmonary consulted given VT and ongoing oxygen requirements and evolemic.   Current HF Medications: Diuretic: held Beta Blocker: carvedilol 25 mg BID ACE/ARB/ARNI: irbesartan 75 mg daily (held) MRA: none SGLT2i: Farxiga 10 mg daily Other: hydralazine 25 mg q8h  Prior to admission HF Medications: Diuretic: furosemide 40 mg daily Beta blocker: carvedilol 25 mg BID ACE/ARB/ARNI: lisinopril 20 mg BID Aldosterone Antagonist: spironolactone 50 mg daily SGLT2i: none Per med rec patient not taking any medications  Pertinent Lab Values: Serum creatinine 1.54, BUN 44, Potassium 4.7, Sodium 135, BNP 193, Magnesium 2.4, A1c 9.9%   Vital Signs: Weight: 211 lbs (admission weight: 285 lbs) Blood pressure:  115-140/90s Heart rate: 80s I/O: not longer documented  Medication Assistance / Insurance Benefits Check: Does the patient have prescription insurance?  No Type of insurance plan: N/A  Does the patient qualify for medication assistance through manufacturers or grants?   Yes Eligible grants and/or patient assistance programs: Farxiga Medication assistance applications in progress: pending  Medication assistance applications approved: none Approved medication assistance renewals will be completed by: pending  Outpatient Pharmacy:  Prior to admission outpatient pharmacy[facility] Is the patient willing to use [facility] pharmacy at discharge? Yes Is the patient willing to transition their outpatient pharmacy to utilize a [facility] outpatient pharmacy?   Pending   Assessment: 1. Acute on chronic systolic CHF (LVEF 30-35%), due to HTN. NYHA class II symptoms. --Scr bumped 1.44 to 1.54, post cath. Patient dry per cath (RA 5, WP3). Agree to holding diuretics. -Patient evolemic on exam, but still requiring supplemental oxygen and new EF drop. Agree pulmonary consult and EP consult and amyloid work-up -HR ~80 bpm, continue carvedilol 25 mg BID. HF goal dose 50 mg BID based on weight > 85 kg. Would not currently titrate further given CI of 2.0 on cath and evolemic. May need to consider ivabradine for additional HR control, however patient assistance for this medication will likely be an issue as he is not a citizen.  -BP controlled. With Scr bump agree with holding irbesartan today and continuing hydralazine for now.  -Patient not currently on MRA. Scr bump will defer addition of spironolactone 25 mg daily for now.  -A1c elevated at 9.9%, patient started on Farxiga 10 mg daily and  further DM management per primary team. -Consider LiveVest with low EF and VT.   Plan: 1) Medication changes recommended at this time: -Recommend amyloid work-up  2) Patient assistance: -patient assistance for  Farxiga completed 02/14/2022 -plan for Match at discharge  -can use 30 day free card for [facility] after Match if patient assistance application is still pending -Delene Loll is not an option as he is not a citizen and tax returns are required by the company for patient assistance  3)  Education  - To be completed prior to discharge  Cathrine Muster, PharmD, [facility] PGY2 Cardiology Pharmacy Resident

## 2022-02-17 NOTE — TOC Progression Note (Signed)
Transition of Care [facility]) - Progression Note    Patient Details  Name: Justin [name] MRN: [id number] Date of Birth: [birth year]  Transition of Care [facility]) CM/SW Contact  Lockie Pares, RN Phone Number: 02/17/2022, 8:59 AM  Clinical Narrative:     Progression of patient, still with high glucose, Diabetic educator is not favorable that he would be a good candidate  for insulin. Possibly DC on jardiance, glucophage. Will need MATCH. Continue with teaching. F/U appt scheduled   Expected Discharge Plan: Home/Self Care Barriers to Discharge: Continued Medical Work up  Expected Discharge Plan and Services Expected Discharge Plan: Home/Self Care   Discharge Planning Services: MATCH Program, Follow-up appt scheduled, Medication Assistance   Living arrangements for the past 2 months: Single Family Home                                       Social Determinants of Health (SDOH) Interventions    Readmission Risk Interventions     No data to display

## 2022-02-17 NOTE — Consult Note (Signed)
Cardiology Consultation:   Patient ID: Justin [name] MRN: [id number]; DOB: 1980/09/21  Admit date: 02/09/2022 Date of Consult: 02/17/2022  PCP:  Patient, No Pcp Per (Inactive)   [facility] Providers Cardiologist:  Buford Dresser, MD        Patient Profile:   Justin [name] is a 41 y.o. male with a hx of NICM, HTN (w/hx of urgency hospitalization), revocery of his EF > D.HF, HLD who is being seen 02/17/2022 for the evaluation of VT at the request of Justin [name].  History of Present Illness:   Justin [name] last saw Dr. Harrell Gave in 2019.  He had previously in 2017 been known to have NICM after a hospitalization elsewhere with heart failure and HTN urgency, his LVEF was down then with severe LVH.  He had subsequent normalization in EF with development of chronic CHF (diastolic). At his last visit with Dr. Harrell Gave he was doing well.. I dont see any cardiac care since 2019.  He was [facility] to [facility] 02/09/22 with marked SOB/hypoxic requiring BIPAP support initial management for pneumonia +.- volume OL, TTE noted recurrent reduction in his PVEF and cardiology consulted 02/11/22. IV diuretics initiated, GDMT likely to be difficult with financial constrictions, given uninsured. Diuresed well over the next few days though also still requiring O2. Over night 6/7-6/8 he had a 22 beat run of NSVT. Planned for [facility] Cath yesterday noted normal coronaries. Planned for High Res CT and c.MRI given despite diuresis, remained SOB and requiring O2  EP is asked to weigh in given his NSVT episode and suspect infiltrative disease, and setting of reduced LVEF  LABS K+ 3.8, 4.7 Mag 2.2 BUN/Creat 44/1.54 (baseline unclear, probably about 1.2 or so) WBC 9.8 H/H 16/48 Plts 393  Today's visit is with video translator: Wynetta Fines # [phone] He denies any CP, no hx of syncope He feels much better, no rest SOB No palpitations, was unaware of the NSVT the other night.  He had not  followed up the last couple years because he was feeling well, stopped his medicines years ago for the same reason.   Past Medical History:  Diagnosis Date   Heart failure with reduced ejection fraction ([place]) 2017   [facility]   Hypertension 2017   [facility]    Past Surgical History:  Procedure Laterality Date   RIGHT/LEFT HEART CATH AND CORONARY ANGIOGRAPHY N/A 02/16/2022   Procedure: RIGHT/LEFT HEART CATH AND CORONARY ANGIOGRAPHY;  Surgeon: Sherren Mocha, MD;  Location: [place] [facility];  Service: Cardiovascular;  Laterality: N/A;     Home Medications:  Prior to Admission medications   Medication Sig Start Date End Date Taking? Authorizing Provider  acetaminophen (TYLENOL) 500 MG tablet Take 1,000 mg by mouth every 6 (six) hours as needed for mild pain.   Yes [provider]  carvedilol (COREG) 25 MG tablet Take 1 tablet (25 mg total) by mouth 2 (two) times daily with a meal. Patient not taking: Reported on 02/10/2022 05/30/18   Buford Dresser, MD  fenofibrate 54 MG tablet Take 1 tablet (54 mg total) by mouth daily. Patient not taking: Reported on 02/10/2022 05/30/18   Buford Dresser, MD  furosemide (LASIX) 40 MG tablet Take 1 tablet (40 mg total) by mouth daily. Patient not taking: Reported on 02/10/2022 05/30/18   Buford Dresser, MD  lisinopril (PRINIVIL,ZESTRIL) 20 MG tablet Take 1 tablet (20 mg total) by mouth 2 (two) times daily. Patient not taking: Reported on 02/10/2022 05/30/18   Buford Dresser,  MD  spironolactone (ALDACTONE) 25 MG tablet Take 2 tablets (50 mg total) by mouth daily. Patient not taking: Reported on 02/10/2022 05/30/18   Buford Dresser, MD    Inpatient Medications: Scheduled Meds:  carvedilol  25 mg Oral BID WC   chlorhexidine  15 mL Mouth Rinse BID   [START ON 02/20/2022] dapagliflozin propanediol  10 mg Oral Daily   glimepiride  3 mg Oral Q breakfast   heparin injection (subcutaneous)  5,000  Units Subcutaneous Q8H   hydrALAZINE  25 mg Oral Q8H   insulin aspart  0-15 Units Subcutaneous TID WC   insulin aspart  0-5 Units Subcutaneous QHS   mouth rinse  15 mL Mouth Rinse q12n4p   melatonin  3 mg Oral QHS   nystatin   Topical BID   sodium chloride flush  3 mL Intravenous Q12H   Continuous Infusions:  sodium chloride     lactated ringers 100 mL/hr at 02/17/22 1051   PRN Meds: sodium chloride, acetaminophen, benzonatate, hydrALAZINE, ipratropium-albuterol, ondansetron (ZOFRAN) IV  Allergies:   No Known Allergies  Social History:   Social History   Socioeconomic History   Marital status: Single    Spouse name: Not on file   Number of children: Not on file   Years of education: Not on file   Highest education level: Not on file  Occupational History   Not on file  Tobacco Use   Smoking status: Never   Smokeless tobacco: Never  Substance and Sexual Activity   Alcohol use: Not on file   Drug use: Not on file   Sexual activity: Not on file  Other Topics Concern   Not on file  Social History Narrative   Not on file   Social Determinants of Health   Financial Resource Strain: Not on file  Food Insecurity: Not on file  Transportation Needs: Not on file  Physical Activity: Not on file  Stress: Not on file  Social Connections: Not on file  Intimate Partner Violence: Not on file    Family History:   Family History  Problem Relation Age of Onset   Hypertension Father      ROS:  Please see the history of present illness.  All other ROS reviewed and negative.     Physical Exam/Data:   Vitals:   02/17/22 0506 02/17/22 0700 02/17/22 0828 02/17/22 1049  BP: 120/87  (!) 141/99 (!) 131/99  Pulse: 75 78 80   Resp: (!) 22 (!) 21    Temp: 98.4 F (36.9 C)     TempSrc: Oral     SpO2:  97%    Weight: 95.7 kg     Height:        Intake/Output Summary (Last 24 hours) at 02/17/2022 1107 Last data filed at 02/17/2022 [id number] Gross per 24 hour  Intake --  Output 1075  ml  Net -1075 ml      02/17/2022    5:06 AM 02/15/2022    5:00 AM 02/09/2022    5:29 AM  Last 3 Weights  Weight (lbs) 211 lb 228 lb 13.4 oz 285 lb 0.9 oz  Weight (kg) 95.709 kg 103.8 kg 129.3 kg     Body mass index is 35.11 kg/m.  General:  Well nourished, well developed, in no acute distress HEENT: normal Neck: no JVD Vascular: No carotid bruits; Distal pulses 2+ bilaterally Cardiac:  RRR; no murmurs, gallops or rubs Lungs:  CTA b/l, no wheezing, rhonchi or rales  Abd: soft, nontender, no  hepatomegaly  Ext: no edema Musculoskeletal:  No deformities Skin: warm and dry  Neuro:  no gross focal motor abnormalities noted Psych:  Normal affect   EKG:  The EKG was personally reviewed and demonstrates:    ST 126bpm, diffuse T changes, no ST changes SR 88bpm  Telemetry:  Telemetry was personally reviewed and demonstrates:   SR 70's One NSMMVT 23 beats early AM 02/16/22    Relevant CV Studies:  02/16/22: R/LHC Patent coronary arteries with minimal luminal irregularity Low cardiac filling pressures (PCWP mean of 3 mmHg) with borderline cardiac output (CO 4.2 L/m and CI 2.0)   Recommend: medical therapy, would decrease or stop diuretics defer to cardiology rounding team    02/09/22: TTE 1. Left ventricular ejection fraction, by estimation, is 30 to 35%. The  left ventricle has moderately decreased function. The left ventricle  demonstrates global hypokinesis. Left ventricular diastolic parameters are  indeterminate.   2. Right ventricular systolic function is normal. The right ventricular  size is normal.   3. The mitral valve is normal in structure. Mild to moderate mitral valve  regurgitation. No evidence of mitral stenosis.   4. The aortic valve is normal in structure. Aortic valve regurgitation is  not visualized. No aortic stenosis is present.   5. There is mild dilatation of the ascending aorta, measuring 38 mm.   6. The inferior vena cava is normal in size with greater than  50%  respiratory variability, suggesting right atrial pressure of 3 mmHg.    04/12/2018: TTE Study Conclusions - Left ventricle: The cavity size was normal. Wall thickness was    increased in a pattern of severe LVH. Systolic function was    normal. The estimated ejection fraction was in the range of 55%    to 60%. The study is not technically sufficient to allow    evaluation of LV diastolic function.  - Left atrium: The atrium was mildly dilated.   Laboratory Data:  High Sensitivity Troponin:  No results for input(s): "TROPONINIHS" in the last 720 hours.   Chemistry Recent Labs  Lab 02/15/22 0342 02/16/22 0414 02/16/22 1425 02/16/22 1433 02/17/22 0611  NA 136 134* 133* 135 135  K 4.4 3.9 4.3 4.1 4.7  CL 93* 95*  --   --  97*  CO2 33* 31  --   --  27  GLUCOSE 118* 138*  --   --  112*  BUN 41* 45*  --   --  44*  CREATININE 1.28* 1.44*  --   --  1.54*  CALCIUM 9.4 9.7  --   --  9.4  MG 2.1 2.4  --   --  2.2  GFRNONAA >60 >60  --   --  57*  ANIONGAP 10 8  --   --  11    Recent Labs  Lab 02/14/22 0115 02/15/22 0342 02/16/22 0414  PROT 6.2* 6.3* 6.8  ALBUMIN 2.2* 2.4* 2.5*  AST 22 26 21   ALT 32 37 33  ALKPHOS 96 97 102  BILITOT 0.6 0.6 0.9   Lipids No results for input(s): "CHOL", "TRIG", "HDL", "LABVLDL", "LDLCALC", "CHOLHDL" in the last 168 hours.  Hematology Recent Labs  Lab 02/15/22 0342 02/16/22 0414 02/16/22 1425 02/16/22 1433 02/17/22 0611  WBC 9.5 7.5  --   --  9.8  RBC 5.21 5.74  --   --  5.46  HGB 15.3 16.4 17.0 17.0 16.0  HCT 46.8 50.2 50.0 50.0 48.2  MCV 89.8 87.5  --   --  88.3  MCH 29.4 28.6  --   --  29.3  MCHC 32.7 32.7  --   --  33.2  RDW 13.3 13.7  --   --  13.6  PLT 413* 432*  --   --  393   Thyroid No results for input(s): "TSH", "FREET4" in the last 168 hours.  BNP Recent Labs  Lab 02/13/22 0546 02/15/22 0342 02/17/22 0611  BNP 962.4* 495.3* 193.8*    DDimer No results for input(s): "DDIMER" in the last 168  hours.   Radiology/Studies:    [facility] Chest Port 1 View Result Date: 02/16/2022 CLINICAL DATA:  Acute hypoxic respiratory failure. EXAM: PORTABLE CHEST 1 VIEW COMPARISON:  02/14/2022 FINDINGS: Stable enlargement of the cardiac silhouette. Bilateral interstitial and airspace opacities are again noted and appear unchanged when compared with the previous exam. No signs of pneumothorax or pleural effusion. Visualized osseous structures are unremarkable. IMPRESSION: 1. No significant change in the appearance of bilateral interstitial and airspace opacities. Electronically Signed   By: Kerby Moors M.D.   On: 02/16/2022 07:16   DG Chest Port 1 View Result Date: 02/14/2022 CLINICAL DATA:  Left chest pain. EXAM: PORTABLE CHEST 1 VIEW COMPARISON:  Radiograph February 13, 2022 FINDINGS: Similar enlarged cardiac silhouette and central vascular prominence. No significant interval change in the severe bilateral interstitial and airspace opacities. Visible pleural effusion or pneumothorax. The visualized skeletal structures are unchanged. IMPRESSION: Similar enlarged cardiac silhouette and central vascular prominence with severe bilateral interstitial and airspace opacities. Electronically Signed   By: Dahlia Bailiff M.D.   On: 02/14/2022 10:03     Assessment and Plan:   NICM Acute/chronic CHF (combined) NSVT Pending: High Res CT and c.MRI (when renal function allows) No hx of syncope  AT this juncture, would continue to advance GDMT as able, no role for AAD or device yet Await ongoing w/u findings  Dr. Quentin Ore will see him later today      Risk Assessment/Risk Scores:    For questions or updates, please contact [facility] Please consult [email or web address] for contact info under    Signed, Baldwin Jamaica, PA-C  02/17/2022 11:07 AM

## 2022-02-17 NOTE — Progress Notes (Signed)
NAME:  Justin [name], MRN:  [id number], DOB:  [birth year], LOS: 61 ADMISSION DATE:  02/09/2022, CONSULTATION DATE:  02/09/2022 REFERRING MD:  EDP, CHIEF COMPLAINT:  SOB   History of Present Illness:  41 year old man (Spanish-speaking) who presented to [facility] 6/1 for SOB. PMHx significant for HTN, LVH, hypertriglyceridemia. Oxygen sats found to be 70% with pulmonary edema on CXR. Recent diagnosis of PNA, although unclear if he completed a course of abx.   Initial history limited as patient is Spanish-speaking only and translator unable to interpret while NIV in place. Chart review revealed LE swelling x 2-3 days but otherwise limited as to acuity of SOB or any other associated symptoms. Prior history of THC/cocaine use.  PCCM consulted for ICU admission.  Asked to see for follow up 6/9 -He is a Curator by trade -Never smoker -No past history of lung disease -No history of chronic shortness of breath -No history of chronic cough -No past history of arthritis, no skin rash  Pertinent Medical History:  LVH HTN Hypertriglyceridemia Recent PNA  Significant Hospital Events: Including procedures, antibiotic start and stop dates in addition to other pertinent events   6/1: admitted to [facility] 2/2 pulmonary edema and acute resp failure 6/2 Improved respiratory status, on [place] 12L, BiPAP overnight. O2 sats improved. Continues with CAP coverage, steroids. Net -1.8L/24H, continuing Lasix. 6/3 Did well overnight, on 6L, feeling improved  Diuresed 6/9 asked to see for follow-up  Interim History / Subjective:  Feels well Overall feels better Oxygen requirement improving Occasional cough Objective:  Blood pressure (!) 131/99, pulse 80, temperature 98.4 F (36.9 C), temperature source Oral, resp. rate (!) 21, height 5\' 5"  (1.651 m), weight 95.7 kg, SpO2 97 %.        Intake/Output Summary (Last 24 hours) at 02/17/2022 1113 Last data filed at 02/17/2022 [id number] Gross per 24 hour  Intake --   Output 1075 ml  Net -1075 ml   Filed Weights   02/09/22 0529 02/15/22 0500 02/17/22 0506  Weight: 129.3 kg 103.8 kg 95.7 kg      General: Middle-age gentleman, does not appear to be in distress HEENT: Moist oral mucosa Neuro: Oriented, no focality CV: S1-S2 appreciated, no murmur PULM: Clear breath sounds with good air entry bilaterally GI: soft, non-distended Extremities: warm/dry, no edema  Skin: no rashes or lesions   Resolved Hospital Problem List:     Assessment & Plan:   Acute hypoxemic respiratory failure Multilobar pneumonia Respiratory viral panel did reveal coronavirus-usually will cause upper respiratory tract infection but rarely may cause pneumonia -He has completed antibiotic course -7 days in total of azithromycin, did have cefepime, ceftriaxone -Was treated with a course of steroids -Oxygen requirement is improving now on 2 L of oxygen  Did have a recent community-acquired pneumonia -Legionella, strep pneumo negative  Cardiomyopathy -Ejection fraction of 30 to 35% -Diuresed -Did have cardiac cath 02/16/2022-results noted  Hypertension  Class II obesity  Chronic kidney disease stage II  Type 2 diabetes   CT scan changes likely in the context of a viral pneumonia  Adenopathy also likely in the context of an infectious process-reactive adenopathy -Follow-up CT for evolution of findings will be appropriate to follow infiltrate to resolution -Clinically he does appear to be improving -May require oxygen supplementation short-term  Best Practice (right click and "Reselect all SmartList Selections" daily)   Diet/type: Regular DVT prophylaxis: LMWH GI prophylaxis: N/A Lines: N/A Foley:  N/A Code Status:  full code Last date of multidisciplinary goals  of care discussion [Per primary  Sherrilyn Rist, MD [place] PCCM Pager: See Shea Evans

## 2022-02-17 NOTE — Progress Notes (Signed)
SATURATION QUALIFICATIONS: (This note is used to comply with regulatory documentation for home oxygen)  Patient Saturations on Room Air at Rest = 95%  Patient Saturations on Room Air while Ambulating = 90%   

## 2022-02-18 LAB — COMPREHENSIVE METABOLIC PANEL
ALT: 30 U/L (ref 0–44)
AST: 21 U/L (ref 15–41)
Albumin: 2.6 g/dL — ABNORMAL LOW (ref 3.5–5.0)
Alkaline Phosphatase: 87 U/L (ref 38–126)
Anion gap: 7 (ref 5–15)
BUN: 31 mg/dL — ABNORMAL HIGH (ref 6–20)
CO2: 23 mmol/L (ref 22–32)
Calcium: 9 mg/dL (ref 8.9–10.3)
Chloride: 103 mmol/L (ref 98–111)
Creatinine, Ser: 1.37 mg/dL — ABNORMAL HIGH (ref 0.61–1.24)
GFR, Estimated: >60 mL/min (ref >60)
Glucose, Bld: 91 mg/dL (ref 70–99)
Potassium: 4.3 mmol/L (ref 3.5–5.1)
Sodium: 133 mmol/L — ABNORMAL LOW (ref 135–145)
Total Bilirubin: 0.9 mg/dL (ref 0.3–1.2)
Total Protein: 6.4 g/dL — ABNORMAL LOW (ref 6.5–8.1)

## 2022-02-18 LAB — LIPOPROTEIN A (LPA): Lipoprotein (a): 21.5 nmol/L (ref <75.0)

## 2022-02-18 LAB — GLUCOSE, CAPILLARY
Glucose-Capillary: 120 mg/dL — ABNORMAL HIGH (ref 70–99)
Glucose-Capillary: 263 mg/dL — ABNORMAL HIGH (ref 70–99)
Glucose-Capillary: 173 mg/dL — ABNORMAL HIGH (ref 70–99)
Glucose-Capillary: 127 mg/dL — ABNORMAL HIGH (ref 70–99)

## 2022-02-18 LAB — ANGIOTENSIN CONVERTING ENZYME: Angiotensin-Converting Enzyme: 16 U/L (ref 14–82)

## 2022-02-18 MED ORDER — HYDRALAZINE HCL 50 MG PO TABS
50.0000 mg | ORAL_TABLET | Freq: Three times a day (TID) | ORAL | Status: DC
Start: 2022-02-18 — End: 2022-02-18

## 2022-02-18 MED ORDER — HYDRALAZINE HCL 25 MG PO TABS
25.0000 mg | ORAL_TABLET | Freq: Every day | ORAL | Status: DC
  Administered 2022-02-19: 25 mg via ORAL
  Filled 2022-02-18: qty 1

## 2022-02-18 MED ORDER — HYDRALAZINE HCL 25 MG PO TABS
25.0000 mg | ORAL_TABLET | Freq: Three times a day (TID) | ORAL | Status: DC
  Administered 2022-02-18: 25 mg via ORAL
  Filled 2022-02-18: qty 1

## 2022-02-18 MED ORDER — ISOSORBIDE MONONITRATE ER 30 MG PO TB24
30.0000 mg | ORAL_TABLET | Freq: Every day | ORAL | Status: DC
  Administered 2022-02-18 – 2022-02-19 (×2): 30 mg via ORAL
  Filled 2022-02-18 (×2): qty 1

## 2022-02-18 NOTE — Progress Notes (Signed)
PROGRESS NOTE                                                                                                                                                                                                             Patient Demographics:    Justin [name], is a 41 y.o. male, DOB - Dec 24, 1980, [id number]  Outpatient Primary MD for the patient is Patient, No Pcp Per (Inactive)    LOS - 9  Admit date - 02/09/2022    Chief Complaint  Patient presents with   Shortness of Breath       Brief Narrative (HPI from H&P)   41 year old man (Spanish-speaking) who presented to [facility] 6/1 for SOB. PMHx significant for HTN, LVH, hypertriglyceridemia. Oxygen sats found to be 70% with pulmonary edema on CXR initially required BiPAP was admitted by the pulmonary critical care team, was also seen by cardiology, he was stabilized and transferred to my service on 02/09/2022.   Subjective:   Patient in bed, appears comfortable, denies any headache, no fever, no chest pain or pressure, no shortness of breath , no abdominal pain. No new focal weakness.   Assessment  & Plan :    Acute hypoxic respiratory failure due to pulmonary edema and questionable atypical pneumonia. he was initially on BiPAP, has been diuresed and now on 2 L nasal cannula, cardiology on board, echo noted with EF 35% which is down from 55% in 2019, also some history of noncompliance with cardiologist, he has been adequately diuresed, he is also completed his pneumonia treatment, currently on medical treatment with beta-blocker, hydralazine, Imdur, and Jardiance, he had developed AKI hence we are holding off currently on ACE/ARB/Entresto held due to AKI.  He Underwent left heart catheterization on 02/16/2022 which was unremarkable with low filling pressures.  He is now undergoing cardiac MRI and being evaluated for EP due to brief run of VT.  Acute on chronic systolic heart failure.   Kindly see #1 above.  .  AKI on CKD stage IIIa.  Baseline creatinine around 1.3, monitor, improved after IV fluids on 02/17/2022.  Avoid nephrotoxins for now  CT chest revealing mediastinal lymphadenopathy.  Outpatient follow-up with pulmonary, needs repeat CT scan within 3 months.   History of hypertension.  Stable on current combination of Coreg, hydralazine and Imdur.  Monitor.  Mild asymptomatic transaminitis due  to hepatic congestion.  Almost resolved, right upper quadrant ultrasound stable.  Morbid obesity.  BMI 47.  Outpatient follow-up with PCP for weight loss.  DM type II.  Received DM and insulin education, diabetic coordinator not confident that patient will be able to take insulin at home hence placed on metformin and Jardiance combination on 02/14/2022 sugars are still very high will add Amaryl, will need testing supplies and teaching education  Lab Results  Component Value Date   HGBA1C 9.9 (H) 02/10/2022   CBG (last 3)  Recent Labs    02/17/22 1741 02/17/22 2215 02/18/22 0758  GLUCAP 111* 114* 120*        Condition - Fair  Family Communication  :  None present  Code Status :  Full  Consults  :  Cards, PCCM  PUD Prophylaxis :    Procedures  :     Left heart catheterization by Dr. Burt Knack 02/16/2022.    Patent coronary arteries with minimal luminal irregularity Low cardiac filling pressures (PCWP mean of 3 mmHg) with borderline cardiac output (CO 4.2 L/m and CI 2.0)   CTA - Extensive patchy alveolar infiltrates are seen in both lungs suggesting possible multifocal pneumonia. There are no cavitary lesions in the lung fields. There are enlarged lymph nodes in the mediastinum, possibly suggesting reactive hyperplasia. Follow-up CT chest in 3 months should be considered to assess resolution and to rule out any neoplastic process in the lymph nodes. Part of this finding may suggest pulmonary edema. There is minimal right pleural effusion. Heart is enlarged in size.    RUQ [place] - stable  TTE -  1. Left ventricular ejection fraction, by estimation, is 30 to 35%. The left ventricle has moderately decreased function. The left ventricle demonstrates global hypokinesis. Left ventricular diastolic parameters are indeterminate.  2. Right ventricular systolic function is normal. The right ventricular size is normal.  3. The mitral valve is normal in structure. Mild to moderate mitral valve regurgitation. No evidence of mitral stenosis.  4. The aortic valve is normal in structure. Aortic valve regurgitation is not visualized. No aortic stenosis is present.  5. There is mild dilatation of the ascending aorta, measuring 38 mm.  6. The inferior vena cava is normal in size with greater than 50% respiratory variability, suggesting right atrial pressure of 3 mmHg.      Disposition Plan  :    Status is: Inpatient  DVT Prophylaxis  :    heparin injection 5,000 Units Start: 02/09/22 1400    Lab Results  Component Value Date   PLT 393 02/17/2022    Diet :  Diet Order             Diet Carb Modified Fluid consistency: Thin; Room service appropriate? Yes  Diet effective now                    Inpatient Medications  Scheduled Meds:  carvedilol  25 mg Oral BID WC   chlorhexidine  15 mL Mouth Rinse BID   [START ON 02/20/2022] dapagliflozin propanediol  10 mg Oral Daily   glimepiride  3 mg Oral Q breakfast   heparin injection (subcutaneous)  5,000 Units Subcutaneous Q8H   hydrALAZINE  25 mg Oral Q8H   insulin aspart  0-15 Units Subcutaneous TID WC   insulin aspart  0-5 Units Subcutaneous QHS   isosorbide mononitrate  30 mg Oral Daily   mouth rinse  15 mL Mouth Rinse q12n4p   melatonin  3 mg Oral QHS   nystatin   Topical BID   sodium chloride flush  3 mL Intravenous Q12H   Continuous Infusions:  sodium chloride      PRN Meds:.sodium chloride, acetaminophen, benzonatate, hydrALAZINE, ipratropium-albuterol, ondansetron (ZOFRAN) IV  Time Spent in minutes   30   Lala Lund M.D on 02/18/2022 at 8:58 AM  To page go to [email or web address]   [facility] -  Office  [phone]  See all Orders from today for further details    Objective:   Vitals:   02/17/22 1804 02/17/22 1956 02/18/22 0610 02/18/22 0800  BP: (!) 117/92 108/77 (!) 134/97 (!) 143/104  Pulse: 90 80 86 81  Resp:  (!) 25 (!) 25 (!) 22  Temp:  98.5 F (36.9 C) 98.6 F (37 C) 98.3 F (36.8 C)  TempSrc:  Oral Oral Oral  SpO2:  93% 90% 94%  Weight:   99.9 kg   Height:        Wt Readings from Last 3 Encounters:  02/18/22 99.9 kg  08/23/18 129.3 kg  05/30/18 123.1 kg     Intake/Output Summary (Last 24 hours) at 02/18/2022 0858 Last data filed at 02/18/2022 0800 Gross per 24 hour  Intake 941.33 ml  Output 600 ml  Net 341.33 ml     Physical Exam  Awake Alert, No new F.N deficits, Normal affect [place].AT,PERRAL Supple Neck, No JVD,   Symmetrical Chest wall movement, Good air movement bilaterally, CTAB RRR,No Gallops, Rubs or new Murmurs,  +ve B.Sounds, Abd Soft, No tenderness,   No Cyanosis, Clubbing or edema     Data Review:    CBC Recent Labs  Lab 02/13/22 0546 02/14/22 0115 02/15/22 0342 02/16/22 0414 02/16/22 1425 02/16/22 1433 02/17/22 0611  WBC 11.2* 11.6* 9.5 7.5  --   --  9.8  HGB 14.0 14.0 15.3 16.4 17.0 17.0 16.0  HCT 43.2 44.5 46.8 50.2 50.0 50.0 48.2  PLT 429* 474* 413* 432*  --   --  393  MCV 89.8 90.8 89.8 87.5  --   --  88.3  MCH 29.1 28.6 29.4 28.6  --   --  29.3  MCHC 32.4 31.5 32.7 32.7  --   --  33.2  RDW 13.2 13.2 13.3 13.7  --   --  13.6  LYMPHSABS 0.6* 1.4 1.9 1.6  --   --   --   MONOABS 0.4 1.0 0.8 0.6  --   --   --   EOSABS 0.0 0.1 0.5 0.3  --   --   --   BASOSABS 0.0 0.0 0.0 0.0  --   --   --     Electrolytes Recent Labs  Lab 02/13/22 0546 02/14/22 0115 02/14/22 1200 02/15/22 0342 02/16/22 0414 02/16/22 1425 02/16/22 1433 02/17/22 0611 02/18/22 0130  NA 136 139  --  136 134* 133* 135 135 133*  K 5.0  4.4  --  4.4 3.9 4.3 4.1 4.7 4.3  CL 96* 96*  --  93* 95*  --   --  97* 103  CO2 32 32  --  33* 31  --   --  27 23  GLUCOSE 231* 104*  --  118* 138*  --   --  112* 91  BUN 47* 45*  --  41* 45*  --   --  44* 31*  CREATININE 1.34* 1.16  --  1.28* 1.44*  --   --  1.54* 1.37*  CALCIUM 9.1 9.2  --  9.4 9.7  --   --  9.4 9.0  AST 20 22  --  26 21  --   --   --  21  ALT 34 32  --  37 33  --   --   --  30  ALKPHOS 109 96  --  97 102  --   --   --  87  BILITOT 0.5 0.6  --  0.6 0.9  --   --   --  0.9  ALBUMIN 2.3* 2.2*  --  2.4* 2.5*  --   --   --  2.6*  MG 2.3 2.1  --  2.1 2.4  --   --  2.2  --   CRP  --   --  4.3* 2.5* 1.8*  --   --   --   --   BNP 962.4*  --   --  495.3*  --   --   --  193.8*  --     ------------------------------------------------------------------------------------------------------------------ No results for input(s): "CHOL", "HDL", "LDLCALC", "TRIG", "CHOLHDL", "LDLDIRECT" in the last 72 hours.  Lab Results  Component Value Date   HGBA1C 9.9 (H) 02/10/2022      Radiology Reports CARDIAC CATHETERIZATION  Result Date: 02/16/2022 Patent coronary arteries with minimal luminal irregularity Low cardiac filling pressures (PCWP mean of 3 mmHg) with borderline cardiac output (CO 4.2 L/m and CI 2.0) Recommend: medical therapy, would decrease or stop diuretics defer to cardiology rounding team   DG Chest Port 1 View  Result Date: 02/16/2022 CLINICAL DATA:  Acute hypoxic respiratory failure. EXAM: PORTABLE CHEST 1 VIEW COMPARISON:  02/14/2022 FINDINGS: Stable enlargement of the cardiac silhouette. Bilateral interstitial and airspace opacities are again noted and appear unchanged when compared with the previous exam. No signs of pneumothorax or pleural effusion. Visualized osseous structures are unremarkable. IMPRESSION: 1. No significant change in the appearance of bilateral interstitial and airspace opacities. Electronically Signed   By: Kerby Moors M.D.   On: 02/16/2022 07:16

## 2022-02-18 NOTE — Progress Notes (Signed)
NAME:  Justin [name], MRN:  [id number], DOB:  1980/12/31, LOS: 26 ADMISSION DATE:  02/09/2022, CONSULTATION DATE:  02/09/2022 REFERRING MD:  EDP, CHIEF COMPLAINT:  SOB   History of Present Illness:  41 year old man (Spanish-speaking) who presented to [facility] 6/1 for SOB. PMHx significant for HTN, LVH, hypertriglyceridemia. Oxygen sats found to be 70% with pulmonary edema on CXR. Recent diagnosis of PNA, although unclear if he completed a course of abx.   Initial history limited as patient is Spanish-speaking only and translator unable to interpret while NIV in place. Chart review revealed LE swelling x 2-3 days but otherwise limited as to acuity of SOB or any other associated symptoms. Prior history of THC/cocaine use.  PCCM consulted for ICU admission.  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 2/2 pulmonary edema and acute resp failure 6/2 Improved respiratory status, on [place] 12L, BiPAP overnight. O2 sats improved. Continues with CAP coverage, steroids. Net -1.8L/24H, continuing Lasix. 6/3 Did well overnight, on 6L, feeling improved  Diuresed 6/9 asked to see for follow-up  Interim History / Subjective:   Feels better and on room air now  Objective:  Blood pressure (!) 103/55, pulse 81, temperature 98.1 F (36.7 C), temperature source Oral, resp. rate 20, height 5\' 5"  (1.651 m), weight 99.9 kg, SpO2 90 %.        Intake/Output Summary (Last 24 hours) at 02/18/2022 1423 Last data filed at 02/18/2022 0800 Gross per 24 hour  Intake 941.33 ml  Output 600 ml  Net 341.33 ml   Filed  Weights   02/15/22 0500 02/17/22 0506 02/18/22 0610  Weight: 103.8 kg 95.7 kg 99.9 kg   Blood pressure (!) 103/55, pulse 81, temperature 98.1 F (36.7 C), temperature source Oral, resp. rate 20, height 5\' 5"  (1.651 m), weight 99.9 kg, SpO2 90 %. Gen:      No acute distress HEENT:  EOMI, sclera anicteric Neck:     No masses; no thyromegaly Lungs:    Clear to auscultation bilaterally; normal respiratory effort CV:         Regular rate and rhythm; no murmurs Abd:      + bowel sounds; soft, non-tender; no palpable masses, no distension Ext:    No edema; adequate peripheral perfusion Skin:      Warm and dry; no rash Neuro: alert and oriented x 3 Psych: normal mood and affect   CT high-resolution on 6/9 with mild improvement in bilateral airspace consolidative disease compared to admission CT on 6/1  Resolved Hospital Problem List:     Assessment & Plan:   Acute hypoxemic respiratory failure Multilobar pneumonia, recent community-acquired pneumonia Respiratory viral panel did reveal coronavirus-usually will cause upper respiratory tract infection but rarely may cause pneumonia He has completed a course of antibiotics and steroids Now oxygen requirements have improved and on room air Have underlying interstitial lung disease but it is difficult to evaluate due to bilateral airspace disease We will arrange for follow-up in the pulmonary clinic for reevaluation and repeat scan.   Cardiomyopathy s/p cardiac cath -Ejection fraction of 30 to 35% Management per primary team  and cardiology   PCCM will be available as needed.  Please call with any questions  Signature:   Marshell Garfinkel MD [place] [facility] [facility] See Amion for pager  If no response to pager , please call [phone] until 7pm After 7:00 pm call Elink  [phone] 02/18/2022, 2:30 PM

## 2022-02-18 NOTE — Progress Notes (Signed)
Progress Note  Patient Name: Justin [name] Date of Encounter: 02/18/2022  [facility] Cardiologist: Jodelle Red, MD   Subjective   Continue to feel well.  No chest pain or shortness of breath.  Off oxygen today.  Inpatient Medications    Scheduled Meds:  carvedilol  25 mg Oral BID WC   chlorhexidine  15 mL Mouth Rinse BID   [START ON 02/20/2022] dapagliflozin propanediol  10 mg Oral Daily   glimepiride  3 mg Oral Q breakfast   heparin injection (subcutaneous)  5,000 Units Subcutaneous Q8H   hydrALAZINE  25 mg Oral Q8H   insulin aspart  0-15 Units Subcutaneous TID WC   insulin aspart  0-5 Units Subcutaneous QHS   mouth rinse  15 mL Mouth Rinse q12n4p   melatonin  3 mg Oral QHS   nystatin   Topical BID   sodium chloride flush  3 mL Intravenous Q12H   Continuous Infusions:  sodium chloride     PRN Meds: sodium chloride, acetaminophen, benzonatate, hydrALAZINE, ipratropium-albuterol, ondansetron (ZOFRAN) IV   Vital Signs    Vitals:   02/17/22 1804 02/17/22 1956 02/18/22 0610 02/18/22 0800  BP: (!) 117/92 108/77 (!) 134/97 (!) 143/104  Pulse: 90 80 86 81  Resp:  (!) 25 (!) 25 (!) 22  Temp:  98.5 F (36.9 C) 98.6 F (37 C) 98.3 F (36.8 C)  TempSrc:  Oral Oral Oral  SpO2:  93% 90% 94%  Weight:   99.9 kg   Height:        Intake/Output Summary (Last 24 hours) at 02/18/2022 0840 Last data filed at 02/18/2022 0800 Gross per 24 hour  Intake 941.33 ml  Output 600 ml  Net 341.33 ml       02/18/2022    6:10 AM 02/17/2022    5:06 AM 02/15/2022    5:00 AM  Last 3 Weights  Weight (lbs) 220 lb 3.2 oz 211 lb 228 lb 13.4 oz  Weight (kg) 99.882 kg 95.709 kg 103.8 kg      Telemetry    Sinus rhythm-personally reviewed  ECG    None noted  Physical Exam   GEN: Well nourished, well developed, in no acute distress  HEENT: normal  Neck: no JVD, carotid bruits, or masses Cardiac: RRR; no murmurs, rubs, or gallops,no edema  Respiratory:  clear to  auscultation bilaterally, normal work of breathing GI: soft, nontender, nondistended, + BS MS: no deformity or atrophy  Skin: warm and dry Neuro:  Strength and sensation are intact Psych: euthymic mood, full affect   Labs    High Sensitivity Troponin:  No results for input(s): "TROPONINIHS" in the last 720 hours.   Chemistry Recent Labs  Lab 02/15/22 0342 02/16/22 0414 02/16/22 1425 02/16/22 1433 02/17/22 0611 02/18/22 0130  NA 136 134*   < > 135 135 133*  K 4.4 3.9   < > 4.1 4.7 4.3  CL 93* 95*  --   --  97* 103  CO2 33* 31  --   --  27 23  GLUCOSE 118* 138*  --   --  112* 91  BUN 41* 45*  --   --  44* 31*  CREATININE 1.28* 1.44*  --   --  1.54* 1.37*  CALCIUM 9.4 9.7  --   --  9.4 9.0  MG 2.1 2.4  --   --  2.2  --   PROT 6.3* 6.8  --   --   --  6.4*  ALBUMIN 2.4* 2.5*  --   --   --  2.6*  AST 26 21  --   --   --  21  ALT 37 33  --   --   --  30  ALKPHOS 97 102  --   --   --  87  BILITOT 0.6 0.9  --   --   --  0.9  GFRNONAA >60 >60  --   --  57* >60  ANIONGAP 10 8  --   --  11 7   < > = values in this interval not displayed.     Lipids No results for input(s): "CHOL", "TRIG", "HDL", "LABVLDL", "LDLCALC", "CHOLHDL" in the last 168 hours.  Hematology Recent Labs  Lab 02/15/22 0342 02/16/22 0414 02/16/22 1425 02/16/22 1433 02/17/22 0611  WBC 9.5 7.5  --   --  9.8  RBC 5.21 5.74  --   --  5.46  HGB 15.3 16.4 17.0 17.0 16.0  HCT 46.8 50.2 50.0 50.0 48.2  MCV 89.8 87.5  --   --  88.3  MCH 29.4 28.6  --   --  29.3  MCHC 32.7 32.7  --   --  33.2  RDW 13.3 13.7  --   --  13.6  PLT 413* 432*  --   --  393    Thyroid No results for input(s): "TSH", "FREET4" in the last 168 hours.  BNP Recent Labs  Lab 02/13/22 0546 02/15/22 0342 02/17/22 0611  BNP 962.4* 495.3* 193.8*     DDimer No results for input(s): "DDIMER" in the last 168 hours.   Radiology    CARDIAC CATHETERIZATION  Result Date: 02/16/2022 Patent coronary arteries with minimal luminal  irregularity Low cardiac filling pressures (PCWP mean of 3 mmHg) with borderline cardiac output (CO 4.2 L/m and CI 2.0) Recommend: medical therapy, would decrease or stop diuretics defer to cardiology rounding team    Cardiac Studies   R/L heart cath 02/16/22 Patent coronary arteries with minimal luminal irregularity Low cardiac filling pressures (PCWP mean of 3 mmHg) with borderline cardiac output (CO 4.2 L/m and CI 2.0)   Recommend: medical therapy, would decrease or stop diuretics defer to cardiology rounding team  Echocardiogram 02/09/22  1. Left ventricular ejection fraction, by estimation, is 30 to 35%. The  left ventricle has moderately decreased function. The left ventricle  demonstrates global hypokinesis. Left ventricular diastolic parameters are  indeterminate.   2. Right ventricular systolic function is normal. The right ventricular  size is normal.   3. The mitral valve is normal in structure. Mild to moderate mitral valve  regurgitation. No evidence of mitral stenosis.   4. The aortic valve is normal in structure. Aortic valve regurgitation is  not visualized. No aortic stenosis is present.   5. There is mild dilatation of the ascending aorta, measuring 38 mm.   6. The inferior vena cava is normal in size with greater than 50%  respiratory variability, suggesting right atrial pressure of 3 mmHg.   Patient Profile     41 y.o. male with a hx of systolic HF who is was seen for the evaluation of SOB.  Assessment & Plan    1.  Acute on chronic systolic heart failure due to nonischemic cardiomyopathy: Ejection fraction 30 to 35%.  BNP was significantly elevated.  Left and right heart catheterization showed low filling pressures and borderline cardiac output but no coronary disease.  Continue optimal medical therapy for heart failure.  Plan for cardiac MRI on Monday.  2.  Ventricular tachycardia: Had 22 beat run of ventricular tachycardia on 02/16/2022.  Coronary artery is normal.   Continue carvedilol.  We Elliannah Wayment reassess post cardiac MRI.  3.  Pneumonia: Per primary team  4.  Hypertension: Continue Coreg  For questions or updates, please contact [facility] Please consult [email or web address] for contact info under        Signed, Calia Napp Jorja Loa, MD  02/18/2022, 8:40 AM

## 2022-02-19 LAB — GLUCOSE, CAPILLARY
Glucose-Capillary: 140 mg/dL — ABNORMAL HIGH (ref 70–99)
Glucose-Capillary: 103 mg/dL — ABNORMAL HIGH (ref 70–99)
Glucose-Capillary: 167 mg/dL — ABNORMAL HIGH (ref 70–99)

## 2022-02-19 LAB — COMPREHENSIVE METABOLIC PANEL
ALT: 25 U/L (ref 0–44)
AST: 21 U/L (ref 15–41)
Albumin: 2.5 g/dL — ABNORMAL LOW (ref 3.5–5.0)
Alkaline Phosphatase: 78 U/L (ref 38–126)
Anion gap: 9 (ref 5–15)
BUN: 29 mg/dL — ABNORMAL HIGH (ref 6–20)
CO2: 23 mmol/L (ref 22–32)
Calcium: 8.9 mg/dL (ref 8.9–10.3)
Chloride: 102 mmol/L (ref 98–111)
Creatinine, Ser: 1.47 mg/dL — ABNORMAL HIGH (ref 0.61–1.24)
GFR, Estimated: >60 mL/min (ref >60)
Glucose, Bld: 127 mg/dL — ABNORMAL HIGH (ref 70–99)
Potassium: 4.5 mmol/L (ref 3.5–5.1)
Sodium: 134 mmol/L — ABNORMAL LOW (ref 135–145)
Total Bilirubin: 0.7 mg/dL (ref 0.3–1.2)
Total Protein: 6.1 g/dL — ABNORMAL LOW (ref 6.5–8.1)

## 2022-02-19 MED ORDER — LACTATED RINGERS IV SOLN: INTRAVENOUS | Status: AC

## 2022-02-19 MED ORDER — GLIMEPIRIDE 4 MG PO TABS
4.0000 mg | ORAL_TABLET | Freq: Every day | ORAL | Status: DC
Start: 2022-02-20 — End: 2022-02-21
  Administered 2022-02-20 – 2022-02-21 (×2): 4 mg via ORAL
  Filled 2022-02-19 (×2): qty 1

## 2022-02-19 NOTE — Progress Notes (Signed)
PROGRESS NOTE                                                                                                                                                                                                             Patient Demographics:    Justin [name], is a 41 y.o. male, DOB - 12-30-1980, [id number]  Outpatient Primary MD for the patient is Patient, No Pcp Per (Inactive)    LOS - 10  Admit date - 02/09/2022    Chief Complaint  Patient presents with   Shortness of Breath       Brief Narrative (HPI from H&P)   41 year old man (Spanish-speaking) who presented to [facility] 6/1 for SOB. PMHx significant for HTN, LVH, hypertriglyceridemia. Oxygen sats found to be 70% with pulmonary edema on CXR initially required BiPAP was admitted by the pulmonary critical care team, was also seen by cardiology, he was stabilized and transferred to my service on 02/09/2022.   Subjective:   Patient in bed, appears comfortable, denies any headache, no fever, no chest pain or pressure, no shortness of breath , no abdominal pain. No new focal weakness.   Assessment  & Plan :    Acute hypoxic respiratory failure due to pulmonary edema and questionable atypical pneumonia. he was initially on BiPAP, has been diuresed and now on 2 L nasal cannula, cardiology on board, echo noted with EF 35% which is down from 55% in 2019, also some history of noncompliance with cardiologist, he has been adequately diuresed, he is also completed his pneumonia treatment, currently on medical treatment with beta-blocker, hydralazine, Imdur, and Jardiance, he had developed AKI hence we are holding off currently on ACE/ARB/Entresto held due to AKI.  He Underwent left heart catheterization on 02/16/2022 which was unremarkable with low filling pressures.  He is now awaiting cardiac MRI and being evaluated for EP due to brief run of VT.  Acute on chronic systolic heart failure.   Kindly see #1 above.  .  AKI on CKD stage IIIa.  Baseline creatinine around 1.3, monitor, improved after IV fluids on 02/17/2022, repeat 02/19/22.  Avoid nephrotoxins for now  CT chest revealing mediastinal lymphadenopathy.  Outpatient follow-up with pulmonary, needs repeat CT scan within 3 months.  High-resolution CT noted needs to follow-up with pulmonary within 1 to 2 weeks of discharge.  Has finished course  for presumed pneumonia.   History of hypertension.  Stable on current combination of Coreg, hydralazine and Imdur.  Monitor.  Mild asymptomatic transaminitis due to hepatic congestion.  Almost resolved, right upper quadrant ultrasound stable.  Morbid obesity.  BMI 47.  Outpatient follow-up with PCP for weight loss.  DM type II.  Received DM and insulin education, diabetic coordinator not confident that patient will be able to take insulin at home hence placed on metformin and Jardiance combination on 02/14/2022 sugars are still very high will add Amaryl, will need testing supplies and teaching education  Lab Results  Component Value Date   HGBA1C 9.9 (H) 02/10/2022   CBG (last 3)  Recent Labs    02/18/22 1202 02/18/22 1608 02/18/22 2153  GLUCAP 263* 173* 127*        Condition - Fair  Family Communication  :  None present  Code Status :  Full  Consults  :  Cards, PCCM  PUD Prophylaxis :    Procedures  :     High Res. CT - 1. Examination is generally somewhat limited by breath motion artifact throughout. Within this limitation, extensive bilateral heterogeneous and consolidative airspace disease is persistent, although somewhat improved compared to prior examination, and is primarily characterized by irregular interstitial opacity, ground-glass, and small areas of consolidation throughout, with notable exception of almost complete sparing of the lung apices. These findings are consistent with nonspecific multifocal infection or inflammation without particular findings to  suggest sarcoidosis, and note evaluation for presence of fibrotic interstitial lung disease is very limited in the setting of acute airspace disease. Consider follow-up outpatient interstitial lung disease protocol examination in 6-8 weeks following the resolution of acute clinical presentation. 2. Numerous prominent mediastinal lymph nodes are similar to prior examination, most likely reactive. 3. Cardiomegaly.   heart catheterization by Dr. Burt Knack 02/16/2022.    Patent coronary arteries with minimal luminal irregularity Low cardiac filling pressures (PCWP mean of 3 mmHg) with borderline cardiac output (CO 4.2 L/m and CI 2.0)   CTA - Extensive patchy alveolar infiltrates are seen in both lungs suggesting possible multifocal pneumonia. There are no cavitary lesions in the lung fields. There are enlarged lymph nodes in the mediastinum, possibly suggesting reactive hyperplasia. Follow-up CT chest in 3 months should be considered to assess resolution and to rule out any neoplastic process in the lymph nodes. Part of this finding may suggest pulmonary edema. There is minimal right pleural effusion. Heart is enlarged in size.   RUQ [place] - stable  TTE -  1. Left ventricular ejection fraction, by estimation, is 30 to 35%. The left ventricle has moderately decreased function. The left ventricle demonstrates global hypokinesis. Left ventricular diastolic parameters are indeterminate.  2. Right ventricular systolic function is normal. The right ventricular size is normal.  3. The mitral valve is normal in structure. Mild to moderate mitral valve regurgitation. No evidence of mitral stenosis.  4. The aortic valve is normal in structure. Aortic valve regurgitation is not visualized. No aortic stenosis is present.  5. There is mild dilatation of the ascending aorta, measuring 38 mm.  6. The inferior vena cava is normal in size with greater than 50% respiratory variability, suggesting right atrial pressure of 3 mmHg.       Disposition Plan  :    Status is: Inpatient  DVT Prophylaxis  :    heparin injection 5,000 Units Start: 02/09/22 1400    Lab Results  Component Value Date   PLT 393  02/17/2022    Diet :  Diet Order             Diet Carb Modified Fluid consistency: Thin; Room service appropriate? Yes  Diet effective now                    Inpatient Medications  Scheduled Meds:  carvedilol  25 mg Oral BID WC   chlorhexidine  15 mL Mouth Rinse BID   [START ON 02/20/2022] dapagliflozin propanediol  10 mg Oral Daily   [START ON 02/20/2022] glimepiride  4 mg Oral Q breakfast   heparin injection (subcutaneous)  5,000 Units Subcutaneous Q8H   hydrALAZINE  25 mg Oral QHS   insulin aspart  0-15 Units Subcutaneous TID WC   isosorbide mononitrate  30 mg Oral Daily   mouth rinse  15 mL Mouth Rinse q12n4p   melatonin  3 mg Oral QHS   nystatin   Topical BID   sodium chloride flush  3 mL Intravenous Q12H   Continuous Infusions:  sodium chloride     lactated ringers      PRN Meds:.sodium chloride, acetaminophen, benzonatate, hydrALAZINE, ipratropium-albuterol, ondansetron (ZOFRAN) IV  Time Spent in minutes  30   Lala Lund M.D on 02/19/2022 at 9:46 AM  To page go to [email or web address]   [facility] -  Office  [phone]  See all Orders from today for further details    Objective:   Vitals:   02/18/22 1609 02/18/22 1818 02/18/22 2047 02/19/22 0600  BP: 108/66 116/77 107/73 (!) 132/94  Pulse: 86 94 83 78  Resp: (!) 22  (!) 24 (!) 23  Temp: 98.2 F (36.8 C)  99 F (37.2 C) 98.3 F (36.8 C)  TempSrc: Oral  Oral Oral  SpO2: 91%  100% 95%  Weight:    99.9 kg  Height:        Wt Readings from Last 3 Encounters:  02/19/22 99.9 kg  08/23/18 129.3 kg  05/30/18 123.1 kg     Intake/Output Summary (Last 24 hours) at 02/19/2022 0946 Last data filed at 02/19/2022 0600 Gross per 24 hour  Intake --  Output 700 ml  Net -700 ml     Physical Exam  Awake Alert, No new  F.N deficits, Normal affect [place].AT,PERRAL Supple Neck, No JVD,   Symmetrical Chest wall movement, Good air movement bilaterally, CTAB RRR,No Gallops, Rubs or new Murmurs,  +ve B.Sounds, Abd Soft, No tenderness,   No Cyanosis, Clubbing or edema     Data Review:    CBC Recent Labs  Lab 02/13/22 0546 02/14/22 0115 02/15/22 0342 02/16/22 0414 02/16/22 1425 02/16/22 1433 02/17/22 0611  WBC 11.2* 11.6* 9.5 7.5  --   --  9.8  HGB 14.0 14.0 15.3 16.4 17.0 17.0 16.0  HCT 43.2 44.5 46.8 50.2 50.0 50.0 48.2  PLT 429* 474* 413* 432*  --   --  393  MCV 89.8 90.8 89.8 87.5  --   --  88.3  MCH 29.1 28.6 29.4 28.6  --   --  29.3  MCHC 32.4 31.5 32.7 32.7  --   --  33.2  RDW 13.2 13.2 13.3 13.7  --   --  13.6  LYMPHSABS 0.6* 1.4 1.9 1.6  --   --   --   MONOABS 0.4 1.0 0.8 0.6  --   --   --   EOSABS 0.0 0.1 0.5 0.3  --   --   --   BASOSABS 0.0 0.0 0.0  0.0  --   --   --     Electrolytes Recent Labs  Lab 02/13/22 0546 02/14/22 0115 02/14/22 1200 02/15/22 0342 02/16/22 0414 02/16/22 1425 02/16/22 1433 02/17/22 0611 02/18/22 0130 02/19/22 0022  NA 136 139  --  136 134* 133* 135 135 133* 134*  K 5.0 4.4  --  4.4 3.9 4.3 4.1 4.7 4.3 4.5  CL 96* 96*  --  93* 95*  --   --  97* 103 102  CO2 32 32  --  33* 31  --   --  27 23 23   GLUCOSE 231* 104*  --  118* 138*  --   --  112* 91 127*  BUN 47* 45*  --  41* 45*  --   --  44* 31* 29*  CREATININE 1.34* 1.16  --  1.28* 1.44*  --   --  1.54* 1.37* 1.47*  CALCIUM 9.1 9.2  --  9.4 9.7  --   --  9.4 9.0 8.9  AST 20 22  --  26 21  --   --   --  21 21  ALT 34 32  --  37 33  --   --   --  30 25  ALKPHOS 109 96  --  97 102  --   --   --  87 78  BILITOT 0.5 0.6  --  0.6 0.9  --   --   --  0.9 0.7  ALBUMIN 2.3* 2.2*  --  2.4* 2.5*  --   --   --  2.6* 2.5*  MG 2.3 2.1  --  2.1 2.4  --   --  2.2  --   --   CRP  --   --  4.3* 2.5* 1.8*  --   --   --   --   --   BNP 962.4*  --   --  495.3*  --   --   --  193.8*  --   --      ------------------------------------------------------------------------------------------------------------------ No results for input(s): "CHOL", "HDL", "LDLCALC", "TRIG", "CHOLHDL", "LDLDIRECT" in the last 72 hours.  Lab Results  Component Value Date   HGBA1C 9.9 (H) 02/10/2022      Radiology Reports CT Chest High Resolution  Result Date: 02/18/2022 CLINICAL DATA:  Cardiomyopathy, recent pneumonia, abnormal chest radiograph, concern for sarcoidosis EXAM: CT CHEST WITHOUT CONTRAST TECHNIQUE: Multidetector CT imaging of the chest was performed following the standard protocol without intravenous contrast. High resolution imaging of the lungs, as well as inspiratory and expiratory imaging, was performed. RADIATION DOSE REDUCTION: This exam was performed according to the departmental dose-optimization program which includes automated exposure control, adjustment of the mA and/or kV according to patient size and/or use of iterative reconstruction technique. COMPARISON:  02/09/2022 FINDINGS: Cardiovascular: No significant vascular findings. Cardiomegaly. No pericardial effusion. Mediastinum/Nodes: Numerous prominent mediastinal lymph nodes are similar to prior examination (series 5, image 42). Thyroid gland, trachea, and esophagus demonstrate no significant findings. Lungs/Pleura: Examination is generally somewhat limited by breath motion artifact throughout. Within this limitation, extensive bilateral heterogeneous and consolidative airspace disease is persistent, although somewhat improved compared to prior examination, and is primarily characterized by irregular interstitial opacity, ground-glass, and small areas of consolidation throughout, with notable exception of almost complete sparing of the lung apices (series 10, image 25). No significant air trapping on expiratory phase imaging. Upper Abdomen: No acute abnormality. Musculoskeletal: No chest wall abnormality. No suspicious osseous lesions  identified. IMPRESSION:  1. Examination is generally somewhat limited by breath motion artifact throughout. Within this limitation, extensive bilateral heterogeneous and consolidative airspace disease is persistent, although somewhat improved compared to prior examination, and is primarily characterized by irregular interstitial opacity, ground-glass, and small areas of consolidation throughout, with notable exception of almost complete sparing of the lung apices. These findings are consistent with nonspecific multifocal infection or inflammation without particular findings to suggest sarcoidosis, and note evaluation for presence of fibrotic interstitial lung disease is very limited in the setting of acute airspace disease. Consider follow-up outpatient interstitial lung disease protocol examination in 6-8 weeks following the resolution of acute clinical presentation. 2. Numerous prominent mediastinal lymph nodes are similar to prior examination, most likely reactive. 3. Cardiomegaly. Electronically Signed   By: Delanna Ahmadi M.D.   On: 02/18/2022 11:26   CARDIAC CATHETERIZATION  Result Date: 02/16/2022 Patent coronary arteries with minimal luminal irregularity Low cardiac filling pressures (PCWP mean of 3 mmHg) with borderline cardiac output (CO 4.2 L/m and CI 2.0) Recommend: medical therapy, would decrease or stop diuretics defer to cardiology rounding team   DG Chest Port 1 View  Result Date: 02/16/2022 CLINICAL DATA:  Acute hypoxic respiratory failure. EXAM: PORTABLE CHEST 1 VIEW COMPARISON:  02/14/2022 FINDINGS: Stable enlargement of the cardiac silhouette. Bilateral interstitial and airspace opacities are again noted and appear unchanged when compared with the previous exam. No signs of pneumothorax or pleural effusion. Visualized osseous structures are unremarkable. IMPRESSION: 1. No significant change in the appearance of bilateral interstitial and airspace opacities. Electronically Signed   By: Kerby Moors M.D.   On: 02/16/2022 07:16

## 2022-02-20 ENCOUNTER — Inpatient Hospital Stay (HOSPITAL_COMMUNITY): Payer: Self-pay

## 2022-02-20 ENCOUNTER — Encounter (HOSPITAL_COMMUNITY): Payer: Self-pay | Admitting: Critical Care Medicine

## 2022-02-20 DIAGNOSIS — I3139 Other pericardial effusion (noninflammatory): Secondary | ICD-10-CM

## 2022-02-20 LAB — COMPREHENSIVE METABOLIC PANEL
ALT: 27 U/L (ref 0–44)
AST: 25 U/L (ref 15–41)
Albumin: 2.5 g/dL — ABNORMAL LOW (ref 3.5–5.0)
Alkaline Phosphatase: 80 U/L (ref 38–126)
Anion gap: 7 (ref 5–15)
BUN: 24 mg/dL — ABNORMAL HIGH (ref 6–20)
CO2: 20 mmol/L — ABNORMAL LOW (ref 22–32)
Calcium: 8.5 mg/dL — ABNORMAL LOW (ref 8.9–10.3)
Chloride: 106 mmol/L (ref 98–111)
Creatinine, Ser: 1.44 mg/dL — ABNORMAL HIGH (ref 0.61–1.24)
GFR, Estimated: >60 mL/min (ref >60)
Glucose, Bld: 131 mg/dL — ABNORMAL HIGH (ref 70–99)
Potassium: 4.4 mmol/L (ref 3.5–5.1)
Sodium: 133 mmol/L — ABNORMAL LOW (ref 135–145)
Total Bilirubin: 0.6 mg/dL (ref 0.3–1.2)
Total Protein: 6.3 g/dL — ABNORMAL LOW (ref 6.5–8.1)

## 2022-02-20 LAB — GLUCOSE, CAPILLARY
Glucose-Capillary: 138 mg/dL — ABNORMAL HIGH (ref 70–99)
Glucose-Capillary: 138 mg/dL — ABNORMAL HIGH (ref 70–99)
Glucose-Capillary: 114 mg/dL — ABNORMAL HIGH (ref 70–99)
Glucose-Capillary: 117 mg/dL — ABNORMAL HIGH (ref 70–99)
Glucose-Capillary: 117 mg/dL — ABNORMAL HIGH (ref 70–99)

## 2022-02-20 MED ORDER — SACUBITRIL-VALSARTAN 24-26 MG PO TABS
1.0000 | ORAL_TABLET | Freq: Two times a day (BID) | ORAL | Status: DC
  Administered 2022-02-20 – 2022-02-21 (×3): 1 via ORAL
  Filled 2022-02-20 (×3): qty 1

## 2022-02-20 MED ORDER — GADOBUTROL 1 MMOL/ML IV SOLN
10.0000 mL | Freq: Once | INTRAVENOUS | Status: AC | PRN
Start: 2022-02-20 — End: 2022-02-20
  Administered 2022-02-20: 10 mL via INTRAVENOUS

## 2022-02-20 MED ORDER — SPIRONOLACTONE 12.5 MG HALF TABLET
12.5000 mg | ORAL_TABLET | Freq: Every day | ORAL | Status: DC
  Administered 2022-02-20 – 2022-02-21 (×2): 12.5 mg via ORAL
  Filled 2022-02-20 (×2): qty 1

## 2022-02-20 NOTE — Progress Notes (Signed)
Heart Failure Stewardship Pharmacist Progress Note   PCP: Patient, No Pcp Per (Inactive) PCP-Cardiologist: Buford Dresser, MD   HPI:  41 yo spanish speaking male with pmh LVH, HTN, hypertriglyceridemia presented after feeling sob. Oxygen sats found to be 70% with pulmonary edema on cxr. Pt did endorse a recent dx of pna, although unclear if he completed a course of abx.   02/13/2022 Chest X-ray showed stable to minimal improvement in extensive bilateral airspace process concerning for pneumonia or acute alveloar edema.   Per med rec patient has not been taking any of his home medications. Upon asking the patient he told me that he stopped taking them because he felt fine. I however, also suspect that cost is an issue.   Echo this admission (02/09/2022) with EF 30-35%, RV normal, mild MR. This is a new drop as 2019 echo with EF of 55-60%.   Patient with 22 beat run of VT 6/8. Chest X-ray this AM also showed no significant change in the appearance of bilateral interstitial and airspace opacities.   R/LHC 02/16/2022 with patent coronary arteries with minimal luminal irregularity with low cardiac filling pressure (RA 5, WP 3) with borderline CO 4.2/CI 2.0, Papi 3.4.   EP and pulmonary consulted given VT and ongoing oxygen requirements and evolemic. Was able to transition to room air. PCCM signed off with follow up and repeat CT as outpatient. Pending cMRI today.  Current HF Medications: Beta Blocker: carvedilol 25 mg BID ACE/ARB/ARNI: Entresto 24/26 mg BID MRA: spironolactone 12.5 mg daily SGLT2i: Farxiga 10 mg daily  Prior to admission HF Medications: Diuretic: furosemide 40 mg daily Beta blocker: carvedilol 25 mg BID ACE/ARB/ARNI: lisinopril 20 mg BID Aldosterone Antagonist: spironolactone 50 mg daily SGLT2i: none Per med rec patient not taking any medications  Pertinent Lab Values: Serum creatinine 1.44, BUN 24, Potassium 4.4, Sodium 133, BNP 193, Magnesium 2.2, A1c 9.9%   Vital  Signs: Weight: 214 lbs (admission weight: 285 lbs) Blood pressure: 140/100s Heart rate: 70s I/O: not well documented  Medication Assistance / Insurance Benefits Check: Does the patient have prescription insurance?  No  Does the patient qualify for medication assistance through manufacturers or grants?   Yes Eligible grants and/or patient assistance programs: Farxiga Medication assistance applications in progress: pending  Medication assistance applications approved: none Approved medication assistance renewals will be completed by: pending  Outpatient Pharmacy:  Prior to admission outpatient pharmacy: [facility] Is the patient willing to use [facility] at discharge? Yes Is the patient willing to transition their outpatient pharmacy to utilize a [facility] outpatient pharmacy?   Pending   Assessment: 1. Acute on chronic systolic CHF (LVEF [id number]), due to HTN. NYHA class II symptoms. -Patient evolemic on exam - no further diuretics at this time -Continue carvedilol 25 mg BID. HF goal dose 50 mg BID based on weight > 85 kg. Would not currently titrate further given CI of 2.0 on cath and evolemic. May need to consider ivabradine for additional HR control, however patient assistance for this medication will likely be an issue as he is not a citizen.  -Started on Entresto 24/26 mg BID - we will not be able to obtain patient assistance through the manufacturer for him (not a [place] citizen and their program requires tax forms). Unless he becomes an advanced HF patient and is able to use their HF fund, he will not be able to afford this. Can continue for now with 30-day free trial card with follow up at [facility]  clinic.  -On spironolactone 12.5 mg daily - consider increasing to target dose 25 mg daily -A1c elevated at 9.9%, patient started on Farxiga 10 mg daily and further DM management per primary team. -Consider LiveVest with low EF and VT. May be difficult to obtain without  insurance/citizenship. Pending cMRI today.   Plan: 1) Medication changes recommended at this time: -Increase spironolactone to 25 mg daily  2) Patient assistance: -patient assistance for Farxiga completed 02/14/2022 -plan for Match at discharge  -can use 30 day free card for [facility] after Match if patient assistance application is still pending -Entresto patient assistance program is not an option as he is not a citizen and tax returns are required by the company for patient assistance. Can use 30-day free trial card and can determine long-term therapy option for him in [facility] clinic.  3)  Education  - To be completed prior to discharge  Kerby Nora, PharmD, BCPS Heart Failure Stewardship Pharmacist Phone [phone]  Please check [email or web address] for unit-specific pharmacist phone numbers

## 2022-02-20 NOTE — Plan of Care (Signed)

## 2022-02-20 NOTE — Progress Notes (Signed)
PROGRESS NOTE                                                                                                                                                                                                             Patient Demographics:    Justin [name], is a 41 y.o. male, DOB - 06/18/1981, DH:[id number]  Outpatient Primary MD for the patient is Patient, No Pcp Per (Inactive)    LOS - 11  Admit date - 02/09/2022    Chief Complaint  Patient presents with   Shortness of Breath       Brief Narrative (HPI from H&P)   41 year old man (Spanish-speaking) who presented to [facility] 6/1 for SOB. PMHx significant for HTN, LVH, hypertriglyceridemia. Oxygen sats found to be 70% with pulmonary edema on CXR initially required BiPAP was admitted by the pulmonary critical care team, was also seen by cardiology, he was stabilized and transferred to my service on 02/09/2022.   Subjective:   Patient in bed, appears comfortable, denies any headache, no fever, no chest pain or pressure, no shortness of breath , no abdominal pain. No new focal weakness.   Assessment  & Plan :    Acute hypoxic respiratory failure due to pulmonary edema and questionable atypical pneumonia. he was initially on BiPAP, has been diuresed and now on 2 L nasal cannula, cardiology on board, echo noted with EF 35% which is down from 55% in 2019, also some history of noncompliance with cardiologist, he has been adequately diuresed, he is also completed his pneumonia treatment, currently on medical treatment with beta-blocker, hydralazine, Imdur, and Jardiance, he had developed AKI hence we are holding off currently on ACE/ARB/Entresto held due to AKI.  He Underwent left heart catheterization on 02/16/2022 which was unremarkable with low filling pressures.  He is now awaiting cardiac MRI on 02/20/22 and being evaluated for EP due to run of VT.  Acute on chronic systolic heart  failure.  Kindly see #1 above.  .  AKI on CKD stage IIIa.  Baseline creatinine around 1.3, monitor, improved after IV fluids on 02/17/2022, repeat 02/19/22.  Avoid nephrotoxins for now  CT chest revealing mediastinal lymphadenopathy.  Outpatient follow-up with pulmonary, needs repeat CT scan within 3 months.  High-resolution CT noted needs to follow-up with pulmonary within 1 to 2 weeks of discharge.  Has finished  course for presumed pneumonia.   History of hypertension.  Stable on current combination of Coreg, hydralazine and Imdur.  Monitor.  Mild asymptomatic transaminitis due to hepatic congestion.  Almost resolved, right upper quadrant ultrasound stable.  Morbid obesity.  BMI 47.  Outpatient follow-up with PCP for weight loss.  DM type II.  Received DM and insulin education, diabetic coordinator not confident that patient will be able to take insulin at home hence placed on metformin and Jardiance combination on 02/14/2022 sugars are still very high will add Amaryl, will need testing supplies and teaching education  Lab Results  Component Value Date   HGBA1C 9.9 (H) 02/10/2022   CBG (last 3)  Recent Labs    02/19/22 1613 02/19/22 2112 02/20/22 0814  GLUCAP 103* 167* 138*        Condition - Fair  Family Communication  :  None present  Code Status :  Full  Consults  :  Cards, PCCM  PUD Prophylaxis :    Procedures  :     High Res. CT - 1. Examination is generally somewhat limited by breath motion artifact throughout. Within this limitation, extensive bilateral heterogeneous and consolidative airspace disease is persistent, although somewhat improved compared to prior examination, and is primarily characterized by irregular interstitial opacity, ground-glass, and small areas of consolidation throughout, with notable exception of almost complete sparing of the lung apices. These findings are consistent with nonspecific multifocal infection or inflammation without particular  findings to suggest sarcoidosis, and note evaluation for presence of fibrotic interstitial lung disease is very limited in the setting of acute airspace disease. Consider follow-up outpatient interstitial lung disease protocol examination in 6-8 weeks following the resolution of acute clinical presentation. 2. Numerous prominent mediastinal lymph nodes are similar to prior examination, most likely reactive. 3. Cardiomegaly.   heart catheterization by Dr. Burt Knack 02/16/2022.    Patent coronary arteries with minimal luminal irregularity Low cardiac filling pressures (PCWP mean of 3 mmHg) with borderline cardiac output (CO 4.2 L/m and CI 2.0)   CTA - Extensive patchy alveolar infiltrates are seen in both lungs suggesting possible multifocal pneumonia. There are no cavitary lesions in the lung fields. There are enlarged lymph nodes in the mediastinum, possibly suggesting reactive hyperplasia. Follow-up CT chest in 3 months should be considered to assess resolution and to rule out any neoplastic process in the lymph nodes. Part of this finding may suggest pulmonary edema. There is minimal right pleural effusion. Heart is enlarged in size.   RUQ [place] - stable  TTE -  1. Left ventricular ejection fraction, by estimation, is 30 to 35%. The left ventricle has moderately decreased function. The left ventricle demonstrates global hypokinesis. Left ventricular diastolic parameters are indeterminate.  2. Right ventricular systolic function is normal. The right ventricular size is normal.  3. The mitral valve is normal in structure. Mild to moderate mitral valve regurgitation. No evidence of mitral stenosis.  4. The aortic valve is normal in structure. Aortic valve regurgitation is not visualized. No aortic stenosis is present.  5. There is mild dilatation of the ascending aorta, measuring 38 mm.  6. The inferior vena cava is normal in size with greater than 50% respiratory variability, suggesting right atrial pressure of  3 mmHg.      Disposition Plan  :    Status is: Inpatient  DVT Prophylaxis  :    heparin injection 5,000 Units Start: 02/09/22 1400    Lab Results  Component Value Date   PLT  393 02/17/2022    Diet :  Diet Order             Diet Carb Modified Fluid consistency: Thin; Room service appropriate? Yes  Diet effective now                    Inpatient Medications  Scheduled Meds:  carvedilol  25 mg Oral BID WC   chlorhexidine  15 mL Mouth Rinse BID   dapagliflozin propanediol  10 mg Oral Daily   glimepiride  4 mg Oral Q breakfast   heparin injection (subcutaneous)  5,000 Units Subcutaneous Q8H   insulin aspart  0-15 Units Subcutaneous TID WC   mouth rinse  15 mL Mouth Rinse q12n4p   melatonin  3 mg Oral QHS   nystatin   Topical BID   sacubitril-valsartan  1 tablet Oral BID   sodium chloride flush  3 mL Intravenous Q12H   spironolactone  12.5 mg Oral Daily   Continuous Infusions:  sodium chloride      PRN Meds:.sodium chloride, acetaminophen, benzonatate, hydrALAZINE, ipratropium-albuterol, ondansetron (ZOFRAN) IV  Time Spent in minutes  30   Lala Lund M.D on 02/20/2022 at 9:51 AM  To page go to [email or web address]   [facility] -  Office  [phone]  See all Orders from today for further details    Objective:   Vitals:   02/20/22 0553 02/20/22 0557 02/20/22 0813 02/20/22 0838  BP:   (!) 154/103   Pulse:   83 83  Resp: 17 12 16    Temp:   98.1 F (36.7 C)   TempSrc:   Oral   SpO2:   90%   Weight:      Height:        Wt Readings from Last 3 Encounters:  02/20/22 97.5 kg  08/23/18 129.3 kg  05/30/18 123.1 kg     Intake/Output Summary (Last 24 hours) at 02/20/2022 0951 Last data filed at 02/19/2022 1700 Gross per 24 hour  Intake 118 ml  Output 100 ml  Net 18 ml     Physical Exam  Awake Alert, No new F.N deficits, Normal affect [place].AT,PERRAL Supple Neck, No JVD,   Symmetrical Chest wall movement, Good air movement bilaterally,  CTAB RRR,No Gallops, Rubs or new Murmurs,  +ve B.Sounds, Abd Soft, No tenderness,   No Cyanosis, Clubbing or edema     Data Review:    CBC Recent Labs  Lab 02/14/22 0115 02/15/22 0342 02/16/22 0414 02/16/22 1425 02/16/22 1433 02/17/22 0611  WBC 11.6* 9.5 7.5  --   --  9.8  HGB 14.0 15.3 16.4 17.0 17.0 16.0  HCT 44.5 46.8 50.2 50.0 50.0 48.2  PLT 474* 413* 432*  --   --  393  MCV 90.8 89.8 87.5  --   --  88.3  MCH 28.6 29.4 28.6  --   --  29.3  MCHC 31.5 32.7 32.7  --   --  33.2  RDW 13.2 13.3 13.7  --   --  13.6  LYMPHSABS 1.4 1.9 1.6  --   --   --   MONOABS 1.0 0.8 0.6  --   --   --   EOSABS 0.1 0.5 0.3  --   --   --   BASOSABS 0.0 0.0 0.0  --   --   --     Electrolytes Recent Labs  Lab 02/14/22 0115 02/14/22 1200 02/15/22 0342 02/16/22 0414 02/16/22 1425 02/16/22 1433 02/17/22 0611 02/18/22 0130 02/19/22  0022 02/20/22 0119  NA 139  --  136 134*   < > 135 135 133* 134* 133*  K 4.4  --  4.4 3.9   < > 4.1 4.7 4.3 4.5 4.4  CL 96*  --  93* 95*  --   --  97* 103 102 106  CO2 32  --  33* 31  --   --  27 23 23  20*  GLUCOSE 104*  --  118* 138*  --   --  112* 91 127* 131*  BUN 45*  --  41* 45*  --   --  44* 31* 29* 24*  CREATININE 1.16  --  1.28* 1.44*  --   --  1.54* 1.37* 1.47* 1.44*  CALCIUM 9.2  --  9.4 9.7  --   --  9.4 9.0 8.9 8.5*  AST 22  --  26 21  --   --   --  21 21 25   ALT 32  --  37 33  --   --   --  30 25 27   ALKPHOS 96  --  97 102  --   --   --  87 78 80  BILITOT 0.6  --  0.6 0.9  --   --   --  0.9 0.7 0.6  ALBUMIN 2.2*  --  2.4* 2.5*  --   --   --  2.6* 2.5* 2.5*  MG 2.1  --  2.1 2.4  --   --  2.2  --   --   --   CRP  --  4.3* 2.5* 1.8*  --   --   --   --   --   --   BNP  --   --  495.3*  --   --   --  193.8*  --   --   --    < > = values in this interval not displayed.    ------------------------------------------------------------------------------------------------------------------ No results for input(s): "CHOL", "HDL", "LDLCALC", "TRIG",  "CHOLHDL", "LDLDIRECT" in the last 72 hours.  Lab Results  Component Value Date   HGBA1C 9.9 (H) 02/10/2022      Radiology Reports CT Chest High Resolution  Result Date: 02/18/2022 CLINICAL DATA:  Cardiomyopathy, recent pneumonia, abnormal chest radiograph, concern for sarcoidosis EXAM: CT CHEST WITHOUT CONTRAST TECHNIQUE: Multidetector CT imaging of the chest was performed following the standard protocol without intravenous contrast. High resolution imaging of the lungs, as well as inspiratory and expiratory imaging, was performed. RADIATION DOSE REDUCTION: This exam was performed according to the departmental dose-optimization program which includes automated exposure control, adjustment of the mA and/or kV according to patient size and/or use of iterative reconstruction technique. COMPARISON:  02/09/2022 FINDINGS: Cardiovascular: No significant vascular findings. Cardiomegaly. No pericardial effusion. Mediastinum/Nodes: Numerous prominent mediastinal lymph nodes are similar to prior examination (series 5, image 42). Thyroid gland, trachea, and esophagus demonstrate no significant findings. Lungs/Pleura: Examination is generally somewhat limited by breath motion artifact throughout. Within this limitation, extensive bilateral heterogeneous and consolidative airspace disease is persistent, although somewhat improved compared to prior examination, and is primarily characterized by irregular interstitial opacity, ground-glass, and small areas of consolidation throughout, with notable exception of almost complete sparing of the lung apices (series 10, image 25). No significant air trapping on expiratory phase imaging. Upper Abdomen: No acute abnormality. Musculoskeletal: No chest wall abnormality. No suspicious osseous lesions identified. IMPRESSION: 1. Examination is generally somewhat limited by breath motion artifact throughout. Within this limitation, extensive  bilateral heterogeneous and consolidative  airspace disease is persistent, although somewhat improved compared to prior examination, and is primarily characterized by irregular interstitial opacity, ground-glass, and small areas of consolidation throughout, with notable exception of almost complete sparing of the lung apices. These findings are consistent with nonspecific multifocal infection or inflammation without particular findings to suggest sarcoidosis, and note evaluation for presence of fibrotic interstitial lung disease is very limited in the setting of acute airspace disease. Consider follow-up outpatient interstitial lung disease protocol examination in 6-8 weeks following the resolution of acute clinical presentation. 2. Numerous prominent mediastinal lymph nodes are similar to prior examination, most likely reactive. 3. Cardiomegaly. Electronically Signed   By: Delanna Ahmadi M.D.   On: 02/18/2022 11:26   CARDIAC CATHETERIZATION  Result Date: 02/16/2022 Patent coronary arteries with minimal luminal irregularity Low cardiac filling pressures (PCWP mean of 3 mmHg) with borderline cardiac output (CO 4.2 L/m and CI 2.0) Recommend: medical therapy, would decrease or stop diuretics defer to cardiology rounding team

## 2022-02-20 NOTE — Care Management (Signed)
1024 02-20-22 MATCH has been completed for this patient. All new medications to be escribed to [facility]. Hospital Follow up Appointment has been scheduled and placed on the AVS. No further needs from Case Manager at this time.

## 2022-02-20 NOTE — Progress Notes (Signed)
Heart Failure Nurse Navigator Progress Note  PCP: Patient, No Pcp Per (Inactive) PCP-Cardiologist: Harrell Gave Admission Diagnosis: None Admitted from: Home  Presentation:   Storm Frisk ( with use of spanish interpreter) presented with shortness of breath, bilateral leg swelling, oxygen sats in the 70's, noticeable bed bugs, put on NRB mask , the Bi-pap, recent history of PNA, started on antibiotics. Admitted and currently awaiting cMRI  With assistance of Spanish interpretor, patient educated about the sign and symptoms of heart failure, daily weights, (gave him a scale) when to call his doctor or go to the ER. Diet/ fluid restrictions, ( patient reported that he drinks on weekends between 30-40 beers, or till he can't anymore, ) spoke of last use of cocaine being 2 weeks ago. Was educated on the importance of taking his medications and going to all his medical appointment, patient voiced his understanding of education, as spanish HF book was given to patient. Scheduled for a HF TOC on 03/03/22.   ECHO/ LVEF: 30-35%  Clinical Course:  Past Medical History:  Diagnosis Date   Heart failure with reduced ejection fraction ([place]) 2017   [facility]   Hypertension 2017   [facility]     Social History   Socioeconomic History   Marital status: Single    Spouse name: Not on file   Number of children: 3   Years of education: Not on file   Highest education level: 7th grade  Occupational History   Occupation: painter    Comment: varies  Tobacco Use   Smoking status: Never   Smokeless tobacco: Never  Vaping Use   Vaping Use: Never used  Substance and Sexual Activity   Alcohol use: Yes    Alcohol/week: 30.0 standard drinks of alcohol    Types: 30 Cans of beer per week    Comment: 30-40 on weekends   Drug use: Yes    Types: "Crack" cocaine    Comment: 2 weeks   Sexual activity: Not on file  Other Topics Concern   Not on file  Social History Narrative    Not on file   Social Determinants of Health   Financial Resource Strain: High Risk (02/20/2022)   Overall Financial Resource Strain (CARDIA)    Difficulty of Paying Living Expenses: Hard  Food Insecurity: No Food Insecurity (02/20/2022)   Hunger Vital Sign    Worried About Running Out of Food in the Last Year: Never true    Ran Out of Food in the Last Year: Never true  Transportation Needs: No Transportation Needs (02/20/2022)   PRAPARE - Hydrologist (Medical): No    Lack of Transportation (Non-Medical): No  Physical Activity: Not on file  Stress: Not on file  Social Connections: Not on file    High Risk Criteria for Readmission and/or Poor Patient Outcomes: Heart failure hospital admissions (last 6 months): 0  No Show rate: 12 % Difficult social situation: yes, No INS, Not a citizen,  Demonstrates medication adherence: No, due to No Ins.  Primary Language: Spanish Literacy level: 7th grade completed, reading and writing, comprehension.   Barriers of Care:   No Insurance/ Not a Citizen Medication compliance Diet/ fluid restrictions ( ETOH on weekends)  Polysubstance  Considerations/Referrals:   Referral made to Heart Failure Pharmacist Stewardship: yes Referral made to Heart Failure CSW/NCM TOC: yes Referral made to Heart & Vascular TOC clinic: yes , 03/03/22  Items for Follow-up on DC/TOC: Medication compliance Diet/ fluid  restrictions ( weekend ETOH) Daily weights (gave him a scale)   Earnestine Leys, BSN, RN Heart Failure Transport planner Only

## 2022-02-20 NOTE — Progress Notes (Signed)
Progress Note  Patient Name: Justin [name] Date of Encounter: 02/20/2022  [facility] Cardiologist: Buford Dresser, MD   Subjective   No CP or dyspnea  Inpatient Medications    Scheduled Meds:  carvedilol  25 mg Oral BID WC   chlorhexidine  15 mL Mouth Rinse BID   dapagliflozin propanediol  10 mg Oral Daily   glimepiride  4 mg Oral Q breakfast   heparin injection (subcutaneous)  5,000 Units Subcutaneous Q8H   hydrALAZINE  25 mg Oral QHS   insulin aspart  0-15 Units Subcutaneous TID WC   isosorbide mononitrate  30 mg Oral Daily   mouth rinse  15 mL Mouth Rinse q12n4p   melatonin  3 mg Oral QHS   nystatin   Topical BID   sodium chloride flush  3 mL Intravenous Q12H   Continuous Infusions:  sodium chloride     PRN Meds: sodium chloride, acetaminophen, benzonatate, hydrALAZINE, ipratropium-albuterol, ondansetron (ZOFRAN) IV   Vital Signs    Vitals:   02/20/22 0541 02/20/22 0544 02/20/22 0553 02/20/22 0557  BP:      Pulse:      Resp: 20 16 17 12   Temp:      TempSrc:      SpO2:      Weight:      Height:        Intake/Output Summary (Last 24 hours) at 02/20/2022 0752 Last data filed at 02/19/2022 1700 Gross per 24 hour  Intake 118 ml  Output 100 ml  Net 18 ml      02/20/2022    4:51 AM 02/19/2022    6:00 AM 02/18/2022    6:10 AM  Last 3 Weights  Weight (lbs) 214 lb 15.2 oz 220 lb 3.2 oz 220 lb 3.2 oz  Weight (kg) 97.5 kg 99.882 kg 99.882 kg      Telemetry    Sinus with rare PVC- Personally Reviewed    GEN: No acute distress.   Neck: No JVD Cardiac: RRR, 2/6 systolic murmur Respiratory: Clear to auscultation bilaterally. GI: Soft, nontender, non-distended  MS: No edema Neuro:  Nonfocal  Psych: Normal affect   Labs   Chemistry Recent Labs  Lab 02/15/22 0342 02/16/22 0414 02/16/22 1425 02/17/22 0611 02/18/22 0130 02/19/22 0022 02/20/22 0119  NA 136 134*   < > 135 133* 134* 133*  K 4.4 3.9   < > 4.7 4.3 4.5 4.4  CL 93*  95*  --  97* 103 102 106  CO2 33* 31  --  27 23 23  20*  GLUCOSE 118* 138*  --  112* 91 127* 131*  BUN 41* 45*  --  44* 31* 29* 24*  CREATININE 1.28* 1.44*  --  1.54* 1.37* 1.47* 1.44*  CALCIUM 9.4 9.7  --  9.4 9.0 8.9 8.5*  MG 2.1 2.4  --  2.2  --   --   --   PROT 6.3* 6.8  --   --  6.4* 6.1* 6.3*  ALBUMIN 2.4* 2.5*  --   --  2.6* 2.5* 2.5*  AST 26 21  --   --  21 21 25   ALT 37 33  --   --  30 25 27   ALKPHOS 97 102  --   --  87 78 80  BILITOT 0.6 0.9  --   --  0.9 0.7 0.6  GFRNONAA >60 >60  --  57* >60 >60 >60  ANIONGAP 10 8  --  11 7 9 7    < > =  values in this interval not displayed.     Hematology Recent Labs  Lab 02/15/22 0342 02/16/22 0414 02/16/22 1425 02/16/22 1433 02/17/22 0611  WBC 9.5 7.5  --   --  9.8  RBC 5.21 5.74  --   --  5.46  HGB 15.3 16.4 17.0 17.0 16.0  HCT 46.8 50.2 50.0 50.0 48.2  MCV 89.8 87.5  --   --  88.3  MCH 29.4 28.6  --   --  29.3  MCHC 32.7 32.7  --   --  33.2  RDW 13.3 13.7  --   --  13.6  PLT 413* 432*  --   --  393    BNP Recent Labs  Lab 02/15/22 0342 02/17/22 0611  BNP 495.3* 193.8*     Patient Profile     41 y.o. male with past medical history of congestive heart failure for evaluation of dyspnea.  Echocardiogram this admission shows ejection fraction 30 to 35%, mild to moderate mitral regurgitation.  Cardiac catheterization shows minimal luminal irregularities and pulmonary capillary wedge pressure of 3 mmHg.  Assessment & Plan    1 nonischemic cardiomyopathy-cardiac catheterization revealed no coronary disease.  We will plan to treat medically.  Continue carvedilol.  Renal function has improved.  Discontinue hydralazine and isosorbide.  We will add Entresto 24/26 twice daily.  Titrate medications as tolerated.  2 acute on chronic systolic congestive heart failure-patient appears to be euvolemic on examination.  Pulmonary capillary wedge pressure low at time of catheterization.  We will hold on diuresis.  Continue Jardiance.   Add spironolactone 12.5 mg daily.  3 nonsustained ventricular tachycardia-continue beta-blocker.  Patient is undergoing cardiac MRI today.  Will await results.  Electrophysiology following.  4 respiratory failure-abnormal chest CT; pulmonary will follow-up with pulmonary following discharge.  5 acute on chronic stage III kidney disease-creatinine unchanged today.  We will follow.  6 hypertension-patient's blood pressure is elevated.  Add Entresto as outlined above and follow.  7 mild to moderate mitral regurgitation-he will need follow-up echoes as an outpatient.  For questions or updates, please contact [place] Please consult [email or web address] for contact info under        Signed, Kirk Ruths, MD  02/20/2022, 7:52 AM

## 2022-02-21 ENCOUNTER — Other Ambulatory Visit (HOSPITAL_COMMUNITY): Payer: Self-pay

## 2022-02-21 LAB — COMPREHENSIVE METABOLIC PANEL
ALT: 29 U/L (ref 0–44)
AST: 26 U/L (ref 15–41)
Albumin: 2.6 g/dL — ABNORMAL LOW (ref 3.5–5.0)
Alkaline Phosphatase: 81 U/L (ref 38–126)
Anion gap: 10 (ref 5–15)
BUN: 20 mg/dL (ref 6–20)
CO2: 20 mmol/L — ABNORMAL LOW (ref 22–32)
Calcium: 9.1 mg/dL (ref 8.9–10.3)
Chloride: 103 mmol/L (ref 98–111)
Creatinine, Ser: 1.39 mg/dL — ABNORMAL HIGH (ref 0.61–1.24)
GFR, Estimated: >60 mL/min (ref >60)
Glucose, Bld: 101 mg/dL — ABNORMAL HIGH (ref 70–99)
Potassium: 4.3 mmol/L (ref 3.5–5.1)
Sodium: 133 mmol/L — ABNORMAL LOW (ref 135–145)
Total Bilirubin: 0.9 mg/dL (ref 0.3–1.2)
Total Protein: 6.8 g/dL (ref 6.5–8.1)

## 2022-02-21 LAB — GLUCOSE, CAPILLARY
Glucose-Capillary: 124 mg/dL — ABNORMAL HIGH (ref 70–99)
Glucose-Capillary: 179 mg/dL — ABNORMAL HIGH (ref 70–99)

## 2022-02-21 MED ORDER — SPIRONOLACTONE 25 MG PO TABS
12.5000 mg | ORAL_TABLET | Freq: Every day | ORAL | 0 refills | Status: DC
  Filled 2022-02-21: qty 15, 30d supply, fill #0

## 2022-02-21 MED ORDER — ACCU-CHEK GUIDE VI STRP
1.0000 | ORAL_STRIP | Freq: Three times a day (TID) | 0 refills | Status: DC
  Filled 2022-02-21: qty 100, 25d supply, fill #0

## 2022-02-21 MED ORDER — GLIMEPIRIDE 2 MG PO TABS
4.0000 mg | ORAL_TABLET | Freq: Every day | ORAL | 0 refills | Status: DC
  Filled 2022-02-21: qty 60, 30d supply, fill #0

## 2022-02-21 MED ORDER — BLOOD GLUCOSE MONITOR KIT: PACK | 1 refills | Status: DC

## 2022-02-21 MED ORDER — SACUBITRIL-VALSARTAN 24-26 MG PO TABS
1.0000 | ORAL_TABLET | Freq: Two times a day (BID) | ORAL | 0 refills | Status: DC
  Filled 2022-02-21: qty 60, 30d supply, fill #0

## 2022-02-21 MED ORDER — CARVEDILOL 25 MG PO TABS
25.0000 mg | ORAL_TABLET | Freq: Two times a day (BID) | ORAL | 0 refills | Status: DC
Start: 2022-02-21 — End: 2022-06-06
  Filled 2022-02-21: qty 60, 30d supply, fill #0

## 2022-02-21 MED ORDER — DAPAGLIFLOZIN PROPANEDIOL 10 MG PO TABS
10.0000 mg | ORAL_TABLET | Freq: Every day | ORAL | 0 refills | Status: DC
  Filled 2022-02-21: qty 30, 30d supply, fill #0

## 2022-02-21 MED ORDER — METFORMIN HCL 500 MG PO TABS
500.0000 mg | ORAL_TABLET | Freq: Two times a day (BID) | ORAL | 0 refills | Status: DC
  Filled 2022-02-21: qty 60, 30d supply, fill #0

## 2022-02-21 MED ORDER — BLOOD GLUCOSE METER KIT
PACK | 0 refills | Status: DC
  Filled 2022-02-21: qty 1, 30d supply, fill #0

## 2022-02-21 MED ORDER — FENOFIBRATE 54 MG PO TABS
54.0000 mg | ORAL_TABLET | Freq: Every day | ORAL | 0 refills | Status: DC
  Filled 2022-02-21: qty 30, 30d supply, fill #0

## 2022-02-21 MED ORDER — ACCU-CHEK SOFTCLIX LANCETS MISC
0 refills | Status: DC
  Filled 2022-02-21: qty 100, 25d supply, fill #0

## 2022-02-21 NOTE — Progress Notes (Signed)
Progress Note  Patient Name: Justin [name] Date of Encounter: 02/21/2022  [facility] Cardiologist: Buford Dresser, MD   Subjective   Pt denies CP or dyspnea  Inpatient Medications    Scheduled Meds:  carvedilol  25 mg Oral BID WC   chlorhexidine  15 mL Mouth Rinse BID   dapagliflozin propanediol  10 mg Oral Daily   glimepiride  4 mg Oral Q breakfast   heparin injection (subcutaneous)  5,000 Units Subcutaneous Q8H   insulin aspart  0-15 Units Subcutaneous TID WC   mouth rinse  15 mL Mouth Rinse q12n4p   melatonin  3 mg Oral QHS   nystatin   Topical BID   sacubitril-valsartan  1 tablet Oral BID   sodium chloride flush  3 mL Intravenous Q12H   spironolactone  12.5 mg Oral Daily   Continuous Infusions:  sodium chloride     PRN Meds: sodium chloride, acetaminophen, benzonatate, hydrALAZINE, ipratropium-albuterol, ondansetron (ZOFRAN) IV   Vital Signs    Vitals:   02/20/22 2040 02/21/22 0022 02/21/22 0400 02/21/22 0440  BP: (!) 157/124 (!) 145/99  113/80  Pulse: 78 78  79  Resp: 16 16  16   Temp: 98.5 F (36.9 C) 98.4 F (36.9 C)  98 F (36.7 C)  TempSrc: Oral Oral  Oral  SpO2: 93% 92% 92% (!) 87%  Weight:    99.2 kg  Height:        Intake/Output Summary (Last 24 hours) at 02/21/2022 0746 Last data filed at 02/21/2022 0526 Gross per 24 hour  Intake 120 ml  Output 1800 ml  Net -1680 ml       02/21/2022    4:40 AM 02/20/2022    4:51 AM 02/19/2022    6:00 AM  Last 3 Weights  Weight (lbs) 218 lb 11.1 oz 214 lb 15.2 oz 220 lb 3.2 oz  Weight (kg) 99.2 kg 97.5 kg 99.882 kg      Telemetry    Sinus with 4 beats NSVT- Personally Reviewed    GEN: NAD  Neck: supple Cardiac: RRR, 2/6 systolic murmur, no gallop Respiratory: CTA GI: Soft, NT/ND MS: No edema Neuro:  Grossly intact Psych: Normal affect   Labs   Chemistry Recent Labs  Lab 02/15/22 0342 02/16/22 0414 02/16/22 1425 02/17/22 0611 02/18/22 0130 02/19/22 0022  02/20/22 0119 02/21/22 0144  NA 136 134*   < > 135   < > 134* 133* 133*  K 4.4 3.9   < > 4.7   < > 4.5 4.4 4.3  CL 93* 95*  --  97*   < > 102 106 103  CO2 33* 31  --  27   < > 23 20* 20*  GLUCOSE 118* 138*  --  112*   < > 127* 131* 101*  BUN 41* 45*  --  44*   < > 29* 24* 20  CREATININE 1.28* 1.44*  --  1.54*   < > 1.47* 1.44* 1.39*  CALCIUM 9.4 9.7  --  9.4   < > 8.9 8.5* 9.1  MG 2.1 2.4  --  2.2  --   --   --   --   PROT 6.3* 6.8  --   --    < > 6.1* 6.3* 6.8  ALBUMIN 2.4* 2.5*  --   --    < > 2.5* 2.5* 2.6*  AST 26 21  --   --    < > 21 25 26   ALT 37 33  --   --    < >  25 27 29   ALKPHOS 97 102  --   --    < > 78 80 81  BILITOT 0.6 0.9  --   --    < > 0.7 0.6 0.9  GFRNONAA >60 >60  --  57*   < > >60 >60 >60  ANIONGAP 10 8  --  11   < > 9 7 10    < > = values in this interval not displayed.      Hematology Recent Labs  Lab 02/15/22 0342 02/16/22 0414 02/16/22 1425 02/16/22 1433 02/17/22 0611  WBC 9.5 7.5  --   --  9.8  RBC 5.21 5.74  --   --  5.46  HGB 15.3 16.4 17.0 17.0 16.0  HCT 46.8 50.2 50.0 50.0 48.2  MCV 89.8 87.5  --   --  88.3  MCH 29.4 28.6  --   --  29.3  MCHC 32.7 32.7  --   --  33.2  RDW 13.3 13.7  --   --  13.6  PLT 413* 432*  --   --  393     BNP Recent Labs  Lab 02/15/22 0342 02/17/22 0611  BNP 495.3* 193.8*      Patient Profile     41 y.o. male with past medical history of congestive heart failure for evaluation of dyspnea.  Echocardiogram this admission shows ejection fraction 30 to 35%, mild to moderate mitral regurgitation.  Cardiac catheterization shows minimal luminal irregularities and pulmonary capillary wedge pressure of 3 mmHg.  Cardiac MRI revealed ejection fraction 29%, basal septal hypertrophy possibly secondary to hypertrophic cardiomyopathy but also possible cardiac sarcoid.  PET scan recommended.  Assessment & Plan    1 nonischemic cardiomyopathy-cardiac catheterization revealed no coronary disease.  Plan medical therapy.   Continue Entresto, carvedilol, Farxiga and spironolactone.  We will advance Entresto later based on follow-up blood pressure readings.  2 acute on chronic systolic congestive heart failure-patient appears to be euvolemic on examination.  Pulmonary capillary wedge pressure low at time of catheterization.  We will hold on diuresis.  Continue Farxiga and spironolactone.  3 nonsustained ventricular tachycardia-continue carvedilol.  Cardiac MRI shows ejection fraction 29%.  Findings felt possibly consistent with hypertrophic cardiomyopathy versus sarcoid and PET scan recommended.  May need LifeVest at discharge.  Will review with electrophysiology.    4 respiratory failure-abnormal chest CT; pulmonary will follow-up after discharge.  5 acute on chronic stage III kidney disease-creatinine unchanged today.  We will continue to monitor.  6 hypertension-patient's blood pressure is elevated.  Entresto added yesterday.  We will follow and advance as needed.  7 mild to moderate mitral regurgitation-he will need follow-up echoes as an outpatient.  For questions or updates, please contact [place] Please consult [email or web address] for contact info under        Signed, Kirk Ruths, MD  02/21/2022, 7:46 AM

## 2022-02-21 NOTE — Discharge Instructions (Signed)
Follow with Primary MD in 7 days   Get CBC, CMP, 2 view Chest X ray -  checked next visit within 1 week by Primary MD    Activity: As tolerated with Full fall precautions use walker/cane & assistance as needed  Disposition Home   Diet: Heart healthy, low carbohydrate diet.  1.5 L strict fluid restriction per day.   Check your Weight same time everyday, if you gain over 2 pounds, or you develop in leg swelling, experience more shortness of breath or chest pain, call your Primary MD immediately. Follow Cardiac Low Salt Diet and 1.5 lit/day fluid restriction.  Accuchecks 4 times/day, Once in AM empty stomach and then before each meal. Log in all results and show them to your Prim.MD in 3 days. If any glucose reading is under 80 or above 300 call your Prim MD immidiately. Follow Low glucose instructions for glucose under 80 as instructed. Special Instructions: If you have smoked or chewed Tobacco  in the last 2 yrs please stop smoking, stop any regular Alcohol  and or any Recreational drug use.  On your next visit with your primary care physician please Get Medicines reviewed and adjusted.  Please request your Prim.MD to go over all Hospital Tests and Procedure/Radiological results at the follow up, please get all Hospital records sent to your Prim MD by signing hospital release before you go home.  If you experience worsening of your admission symptoms, develop shortness of breath, life threatening emergency, suicidal or homicidal thoughts you must seek medical attention immediately by calling 911 or calling your MD immediately  if symptoms less severe.  You Must read complete instructions/literature along with all the possible adverse reactions/side effects for all the Medicines you take and that have been prescribed to you. Take any new Medicines after you have completely understood and accpet all the possible adverse reactions/side effects.

## 2022-02-21 NOTE — Progress Notes (Signed)
Heart Failure Stewardship Pharmacist Progress Note   PCP: Patient, No Pcp Per (Inactive) PCP-Cardiologist: Buford Dresser, MD   HPI:  41 yo spanish speaking male with pmh LVH, HTN, hypertriglyceridemia presented after feeling sob. Oxygen sats found to be 70% with pulmonary edema on cxr. Pt did endorse a recent dx of pna, although unclear if he completed a course of abx.   02/13/2022 Chest X-ray showed stable to minimal improvement in extensive bilateral airspace process concerning for pneumonia or acute alveloar edema.   Per med rec patient has not been taking any of his home medications. Upon asking the patient he told me that he stopped taking them because he felt fine. I however, also suspect that cost is an issue.   Echo this admission (02/09/2022) with EF 30-35%, RV normal, mild MR. This is a new drop as 2019 echo with EF of 55-60%.   Patient with 22 beat run of VT 6/8. Chest X-ray this AM also showed no significant change in the appearance of bilateral interstitial and airspace opacities.   R/LHC 02/16/2022 with patent coronary arteries with minimal luminal irregularity with low cardiac filling pressure (RA 5, WP 3) with borderline CO 4.2/CI 2.0, Papi 3.4.   EP and pulmonary consulted given VT and ongoing oxygen requirements and evolemic. Was able to transition to room air. PCCM signed off with follow up and repeat CT as outpatient. cMRI on 6/12 with severe systolic dysfunction (EF [id number]), asymmetric LVH meeting criteria for hypertrophic cardiomyopathy. Recommend PET for sarcoid workup.  Current HF Medications: Beta Blocker: carvedilol 25 mg BID ACE/ARB/ARNI: Entresto 24/26 mg BID MRA: spironolactone 12.5 mg daily SGLT2i: Farxiga 10 mg daily  Prior to admission HF Medications: Diuretic: furosemide 40 mg daily Beta blocker: carvedilol 25 mg BID ACE/ARB/ARNI: lisinopril 20 mg BID Aldosterone Antagonist: spironolactone 50 mg daily SGLT2i: none Per med rec patient not taking any  medications  Pertinent Lab Values: Serum creatinine 1.39, BUN 20, Potassium 4.3, Sodium 133, BNP 193, Magnesium 2.2, A1c 9.9%   Vital Signs: Weight: 218 lbs (admission weight: 285 lbs) Blood pressure: 130-150/90-100s Heart rate: 80s I/O: not well documented  Medication Assistance / Insurance Benefits Check: Does the patient have prescription insurance?  No  Does the patient qualify for medication assistance through manufacturers or grants?   Yes Eligible grants and/or patient assistance programs: Farxiga Medication assistance applications in progress: pending  Medication assistance applications approved: none Approved medication assistance renewals will be completed by: pending  Outpatient Pharmacy:  Prior to admission outpatient pharmacy: [facility] Is the patient willing to use [facility] at discharge? Yes Is the patient willing to transition their outpatient pharmacy to utilize a [facility] outpatient pharmacy?   Pending   Assessment: 1. Acute on chronic systolic CHF (LVEF 99991111), due to HTN. NYHA class II symptoms. -Patient euvolemic on exam, documented weights and I/O unreliable - no further diuretics at this time. -Continue carvedilol 25 mg BID. HF goal dose 50 mg BID based on weight > 85 kg. Would not currently titrate further given CI of 2.0 on cath and euvolemic. May need to consider ivabradine for additional HR control, however patient assistance for this medication will likely be an issue as he is not a citizen.  -Started on Entresto 24/26 mg BID - we will not be able to obtain patient assistance through the manufacturer for him (not a [place] citizen and their program requires tax forms). Unless he becomes an advanced HF patient and is able to use their HF  fund, he will not be able to afford this. Can continue for now with 30-day free trial card with follow up at [facility] clinic.  -On spironolactone 12.5 mg daily - consider increasing to target dose 25 mg daily -A1c  elevated at 9.9%, patient started on Farxiga 10 mg daily and further DM management per primary team. -Consider LiveVest with low EF and VT. May be difficult to obtain without insurance/citizenship.   Plan: 1) Medication changes recommended at this time: -Increase spironolactone to 25 mg daily  2) Patient assistance: -patient assistance for Farxiga completed 02/14/2022 -plan for Match at discharge  -can use 30 day free card for [facility] after Match if patient assistance application is still pending -Entresto patient assistance program is not an option as he is not a citizen and tax returns are required by the company for patient assistance. Can use 30-day free trial card and can determine long-term therapy option for him in [facility] clinic.  3)  Education  - To be completed prior to discharge  Kerby Nora, PharmD, BCPS Heart Failure Stewardship Pharmacist Phone [phone]  Please check [email or web address] for unit-specific pharmacist phone numbers

## 2022-02-21 NOTE — Discharge Summary (Signed)
Damarious Holtsclaw ZHG:[id number] DOB: March 02, 1981 DOA: 02/09/2022  PCP: Patient, No Pcp Per (Inactive)  Admit date: 02/09/2022  Discharge date: 02/21/2022  Admitted From: Home   Disposition:  Home   Recommendations for Outpatient Follow-up:   Follow up with PCP in 1-2 weeks  PCP Please obtain BMP/CBC, 2 view CXR in 1week,  (see Discharge instructions)   PCP Please follow up on the following pending results:    Home Health: None   Equipment/Devices: None  Consultations: Cards, DM educator Discharge Condition: Stable    CODE STATUS: Full    Diet Recommendation: Heart Healthy low carbohydrate with 1500 cc of fluid restriction per day.    Chief Complaint  Patient presents with   Shortness of Breath     Brief history of present illness from the day of admission and additional interim summary    41 year old man (Spanish-speaking) who presented to [facility] 6/1 for SOB. PMHx significant for HTN, LVH, hypertriglyceridemia. Oxygen sats found to be 70% with pulmonary edema on CXR initially required BiPAP was admitted by the pulmonary critical care team, was also seen by cardiology, he was stabilized and transferred to my service on 02/09/2022.                                                                 Hospital Course    Acute hypoxic respiratory failure due to pulmonary edema and questionable atypical pneumonia. he was initially on BiPAP, has been diuresed and now on stable on room air for several days, he has been seen by cardiology and GI.  He underwent echocardiogram which suggested an EF of 35%, followed by cardiac MRI showing some structural changes suspicious for underlying sarcoidosis.  Cardiology and pulmonary has seen and cleared him for discharge for now, per cardiology he will be discharged on high-dose  Coreg, Entresto along with Aldactone.  In the past he has been noncompliant with his cardiology appointments and he has been counseled for that.  He also underwent an unremarkable left heart catheterization with no significant CAD.  Plan is close outpatient cardiology and pulmonary follow-up postdischarge.  PCP to arrange.  He will get 30-day supplies of his medications through [facility] upon discharge.  Currently he is symptom-free   Acute on chronic systolic heart failure.  Kindly see #1 above.  .   AKI on CKD stage IIIa.  Baseline creatinine around 1.3, monitor, improved after IV fluids on 02/17/2022, repeat 02/19/22.  ECP to monitor renal function closely on Entresto and Aldactone combination   CT chest revealing mediastinal lymphadenopathy.  Outpatient follow-up with pulmonary, needs repeat CT scan within 3 months.  High-resolution CT and cardiac MRI noted needs to follow-up with pulmonary within 1 to 2 weeks of discharge.  Has finished course for presumed pneumonia.  Of note his  ACE inhibitor levels are normal   History of hypertension.  Stable on current combination of Coreg, Entresto and Aldactone combination.  PCP to monitor and adjust   Mild asymptomatic transaminitis due to hepatic congestion.  Almost resolved, right upper quadrant ultrasound stable.  Much improved check CMP in 7 to 10 days in the outpatient setting.   Morbid obesity.  BMI 47.  Outpatient follow-up with PCP for weight loss.   DM type II.  Received DM and insulin education, diabetic coordinator not confident that patient will be able to take insulin at home hence placed on metformin, Farxiga and Amaryl combination.  CBGs for now stable.  Testing supplies in 1 month supply of medications will be provided.  Close outpatient monitoring of A1c and CBGs by PCP.   Discharge diagnosis     Principal Problem:   Acute hypoxemic respiratory failure (HCC) Active Problems:   Dyspnea   Community acquired pneumonia   Nonrheumatic  mitral valve regurgitation   Acute systolic CHF (congestive heart failure) ([place])   Primary hypertension   Cardiomyopathy due to hypertension, with heart failure ([place])   Ventricular tachycardia [facility])    Discharge instructions    Discharge Instructions     Discharge instructions   Complete by: As directed    Follow with Primary MD in 7 days   Get CBC, CMP, 2 view Chest X ray -  checked next visit within 1 week by Primary MD    Activity: As tolerated with Full fall precautions use walker/cane & assistance as needed  Disposition Home   Diet: Heart healthy, low carbohydrate diet.  1.5 L strict fluid restriction per day.   Check your Weight same time everyday, if you gain over 2 pounds, or you develop in leg swelling, experience more shortness of breath or chest pain, call your Primary MD immediately. Follow Cardiac Low Salt Diet and 1.5 lit/day fluid restriction.  Accuchecks 4 times/day, Once in AM empty stomach and then before each meal. Log in all results and show them to your Prim.MD in 3 days. If any glucose reading is under 80 or above 300 call your Prim MD immidiately. Follow Low glucose instructions for glucose under 80 as instructed. Special Instructions: If you have smoked or chewed Tobacco  in the last 2 yrs please stop smoking, stop any regular Alcohol  and or any Recreational drug use.  On your next visit with your primary care physician please Get Medicines reviewed and adjusted.  Please request your Prim.MD to go over all Hospital Tests and Procedure/Radiological results at the follow up, please get all Hospital records sent to your Prim MD by signing hospital release before you go home.  If you experience worsening of your admission symptoms, develop shortness of breath, life threatening emergency, suicidal or homicidal thoughts you must seek medical attention immediately by calling 911 or calling your MD immediately  if symptoms less severe.  You Must read complete  instructions/literature along with all the possible adverse reactions/side effects for all the Medicines you take and that have been prescribed to you. Take any new Medicines after you have completely understood and accpet all the possible adverse reactions/side effects.   Increase activity slowly   Complete by: As directed        Discharge Medications   Allergies as of 02/21/2022   No Known Allergies      Medication List     STOP taking these medications    furosemide 40 MG tablet Commonly known as: LASIX  lisinopril 20 MG tablet Commonly known as: ZESTRIL       TAKE these medications    acetaminophen 500 MG tablet Commonly known as: TYLENOL Take 1,000 mg by mouth every 6 (six) hours as needed for mild pain.   blood glucose meter kit and supplies Kit Dispense based on patient and insurance preference. Use up to four times daily as directed. (FOR ICD-9 250.00, 250.01). For QAC - HS accuchecks.   carvedilol 25 MG tablet Commonly known as: COREG Take 1 tablet (25 mg total) by mouth 2 (two) times daily with a meal.   dapagliflozin propanediol 10 MG Tabs tablet Commonly known as: FARXIGA Take 1 tablet (10 mg total) by mouth daily. Start taking on: February 22, 2022   fenofibrate 54 MG tablet Take 1 tablet (54 mg total) by mouth daily.   FREESTYLE LITE test strip Generic drug: glucose blood 1 each by Other route 4 (four) times daily -  before meals and at bedtime. Glucometer with diabetic testing supplies QAC-HS for any approved brand 1 month supply   glimepiride 4 MG tablet Commonly known as: AMARYL Take 1 tablet (4 mg total) by mouth daily with breakfast. Start taking on: February 22, 2022   metFORMIN 500 MG tablet Commonly known as: Glucophage Take 1 tablet (500 mg total) by mouth 2 (two) times daily with a meal.   sacubitril-valsartan 24-26 MG Commonly known as: ENTRESTO Take 1 tablet by mouth 2 (two) times daily.   spironolactone 25 MG tablet Commonly known  as: ALDACTONE Take 0.5 tablets (12.5 mg total) by mouth daily. Start taking on: February 22, 2022 What changed: how much to take         Follow-up Information     Camillia Herter, NP Follow up.   Specialty: Nurse Practitioner Why: Follow-up appointment with Durene Fruits, Tuesday, June 26 @ 2:00pm. Reminded office of patient's interpreter needs (Spanish). I spoke w/[facility] Contact information: [place] [address] [phone]         Camillia Herter, NP .   Specialty: Nurse Practitioner Contact information: [place] [address] [phone]         Marshell Garfinkel, MD. Schedule an appointment as soon as possible for a visit in 2 week(s).   Specialty: Pulmonary Disease Contact information: [address] [place] [address] [phone]         Jerline Pain, MD. Schedule an appointment as soon as possible for a visit in 1 week(s).   Specialty: Cardiology Contact information: [address] [place] [address] [phone]         [place]. Go in 11 day(s).   Specialty: Cardiology Why: Hospital followw up PLEASE bring a current list of medications to appointment FREE valet parking, Entrance C, off of [address]. Contact information: [address] [id number] [place] [phone]                Major procedures and Radiology Reports - PLEASE review detailed and final reports thoroughly  -      MR CARDIAC MORPHOLOGY W WO CONTRAST  Result Date: 02/20/2022 CLINICAL DATA:  71M with NICM. Echo with EF 30-35%. Cath shows no obstructive CAD. EXAM: CARDIAC MRI TECHNIQUE: The patient was scanned on a 1.5 Tesla Siemens magnet. A dedicated cardiac coil was used. Functional imaging was done using Fiesta sequences. 2,3, and 4 chamber  views were done to assess for RWMA's. Modified Simpson's rule  using a short axis stack was used to calculate an ejection fraction on a dedicated work Conservation officer, nature. The patient received 10 cc of Gadavist. After 10 minutes inversion recovery sequences were used to assess for infiltration and scar tissue. CONTRAST:  10 cc  of Gadavist FINDINGS: Left ventricle: -Asymmetric hypertrophy measuring 84m in basal septum (183min posterior wall) -Severe dilatation -Severe systolic dysfunction -Nonspecific ECV elevation (31%) -Normal myocardial T2 values -Patchy LGE in septum and RV insertion sites, along with subepicardial LGE in inferolateral wall. LGE accounts for 12% of total myocardial mass LV EF: 29% (Normal 56-78%) Absolute volumes: LV EDV: 34863mNormal 77-195 mL) LV ESV: 248m66mormal 19-72 mL) LV SV: 100mL68mrmal 51-133 mL) CO: 8.1L/min (Normal 2.8-8.8 L/min) Indexed volumes: LV EDV: 164mL/22m (Normal 47-92 mL/sq-m) LV ESV: 117mL/s26m(Normal 13-30 mL/sq-m) LV SV: 47mL/sq31mNormal 32-62 mL/sq-m) CI: 3.9L/min/sq-m (Normal 1.7-4.2 L/min/sq-m) Right ventricle: Normal size with mild systolic dysfunction RV EF:  45% (Normal 47-74%) Absolute volumes: RV EDV: 194mL (No67m 88-227 mL) RV ESV: 107mL (Nor28m23-103 mL) RV SV: 87mL (Norm71m2-138 mL) CO: 7.1L/min (Normal 2.8-8.8 L/min) Indexed volumes: RV EDV: 92mL/sq-m (72mal 55-105 mL/sq-m) RV ESV: 51mL/sq-m (N68ml 15-43 mL/sq-m) RV SV: 41mL/sq-m (No40m 32-64 mL/sq-m) CI: 3.4L/min/sq-m (Normal 1.7-4.2 L/min/sq-m) Left atrium: Moderate enlargement Right atrium: Mild enlargement Mitral valve: Mild regurgitation Aortic valve: No regurgitation Tricuspid valve: Trivial regurgitation Pulmonic valve: No regurgitation Aorta: Normal proximal ascending aorta Pericardium: Small effusion IMPRESSION: 1.  Severe LV dilatation with severe systolic dysfunction (EF 29%) 2. Asymme00%c LV hypertrophy measuring 17mm in basal 21mum (10mm in posteri53mall), meeting criteria for hypertrophic cardiomyopathy 3. Patchy LGE in septum and  RV insertion sites, along with subepicardial LGE in inferolateral wall. LGE accounts for 12% of total myocardial mass. While scar pattern is consistent with HCM, the subepicardial LGE in inferolateral wall is atypical, and would also consider sarcoidosis in the differential. While suspect HCM is most likely diagnosis, sarcoid can mimick HCM MRI findings, and in setting of atypical HCM LGE pattern and mediastinal lymphadenopathy, recommend cardiac PET to evaluate for sarcoid 4.  Normal RV size with mild systolic dysfunction (EF 45%) 5.  Small p34%cardial effusion Electronically Signed   By: Christopher  SchOswaldo Milian2/2023 23:20   CT Chest High Resolution  Result Date: 02/18/2022 CLINICAL DATA:  Cardiomyopathy, recent pneumonia, abnormal chest radiograph, concern for sarcoidosis EXAM: CT CHEST WITHOUT CONTRAST TECHNIQUE: Multidetector CT imaging of the chest was performed following the standard protocol without intravenous contrast. High resolution imaging of the lungs, as well as inspiratory and expiratory imaging, was performed. RADIATION DOSE REDUCTION: This exam was performed according to the departmental dose-optimization program which includes automated exposure control, adjustment of the mA and/or kV according to patient size and/or use of iterative reconstruction technique. COMPARISON:  02/09/2022 FINDINGS: Cardiovascular: No significant vascular findings. Cardiomegaly. No pericardial effusion. Mediastinum/Nodes: Numerous prominent mediastinal lymph nodes are similar to prior examination (series 5, image 42). Thyroid gland, trachea, and esophagus demonstrate no significant findings. Lungs/Pleura: Examination is generally somewhat limited by breath motion artifact throughout. Within this limitation, extensive bilateral heterogeneous and consolidative airspace disease is persistent, although somewhat improved compared to prior examination, and is primarily characterized by irregular interstitial  opacity, ground-glass, and small areas of consolidation throughout, with notable exception of almost complete sparing of the lung apices (series 10, image 25). No significant air trapping on expiratory phase imaging. Upper Abdomen:  No acute abnormality. Musculoskeletal: No chest wall abnormality. No suspicious osseous lesions identified. IMPRESSION: 1. Examination is generally somewhat limited by breath motion artifact throughout. Within this limitation, extensive bilateral heterogeneous and consolidative airspace disease is persistent, although somewhat improved compared to prior examination, and is primarily characterized by irregular interstitial opacity, ground-glass, and small areas of consolidation throughout, with notable exception of almost complete sparing of the lung apices. These findings are consistent with nonspecific multifocal infection or inflammation without particular findings to suggest sarcoidosis, and note evaluation for presence of fibrotic interstitial lung disease is very limited in the setting of acute airspace disease. Consider follow-up outpatient interstitial lung disease protocol examination in 6-8 weeks following the resolution of acute clinical presentation. 2. Numerous prominent mediastinal lymph nodes are similar to prior examination, most likely reactive. 3. Cardiomegaly. Electronically Signed   By: Delanna Ahmadi M.D.   On: 02/18/2022 11:26   CARDIAC CATHETERIZATION  Result Date: 02/16/2022 Patent coronary arteries with minimal luminal irregularity Low cardiac filling pressures (PCWP mean of 3 mmHg) with borderline cardiac output (CO 4.2 L/m and CI 2.0) Recommend: medical therapy, would decrease or stop diuretics defer to cardiology rounding team   DG Chest Port 1 View  Result Date: 02/16/2022 CLINICAL DATA:  Acute hypoxic respiratory failure. EXAM: PORTABLE CHEST 1 VIEW COMPARISON:  02/14/2022 FINDINGS: Stable enlargement of the cardiac silhouette. Bilateral interstitial and  airspace opacities are again noted and appear unchanged when compared with the previous exam. No signs of pneumothorax or pleural effusion. Visualized osseous structures are unremarkable. IMPRESSION: 1. No significant change in the appearance of bilateral interstitial and airspace opacities. Electronically Signed   By: Kerby Moors M.D.   On: 02/16/2022 07:16   DG Chest Port 1 View  Result Date: 02/14/2022 CLINICAL DATA:  Left chest pain. EXAM: PORTABLE CHEST 1 VIEW COMPARISON:  Radiograph February 13, 2022 FINDINGS: Similar enlarged cardiac silhouette and central vascular prominence. No significant interval change in the severe bilateral interstitial and airspace opacities. Visible pleural effusion or pneumothorax. The visualized skeletal structures are unchanged. IMPRESSION: Similar enlarged cardiac silhouette and central vascular prominence with severe bilateral interstitial and airspace opacities. Electronically Signed   By: Dahlia Bailiff M.D.   On: 02/14/2022 10:03   DG Chest 2 View  Result Date: 02/13/2022 CLINICAL DATA:  Cough, hypertension EXAM: CHEST - 2 VIEW COMPARISON:  02/10/2022 FINDINGS: Heart remains enlarged with similar to minimal improvement in the severe bilateral airspace disease nonspecific for bilateral pneumonia, or alveolar edema. No enlarging effusion or pneumothorax. Trachea midline. No acute osseous finding. IMPRESSION: Stable to minimal improvement in the extensive bilateral airspace process concerning for pneumonia or acute alveolar edema. Stable cardiomegaly Electronically Signed   By: Jerilynn Mages.  Shick M.D.   On: 02/13/2022 10:48   DG CHEST PORT 1 VIEW  Result Date: 02/10/2022 CLINICAL DATA:  Hypoxia. EXAM: PORTABLE CHEST 1 VIEW COMPARISON:  Chest CT yesterday at 7:48 a.m. FINDINGS: Moderate cardiomegaly. Central vessels are not well seen although were mildly prominent on CT and probably still are prominent. The mediastinal configuration is stable. Extensive bilateral patchy dense  airspace disease continues to be seen with relative apical sparing. The lung opacities more confluent in the left lower lung field than yesterday but otherwise have not changed in the interval. There are bilateral minimal pleural effusions. No acute osseous abnormality. IMPRESSION: 1. Extensive airspace disease, worsened in the left lower zone today and otherwise unchanged. Findings may suggest pneumonia, edema or combination. 2. Cardiomegaly. Electronically Signed   By:  Telford Nab M.D.   On: 02/10/2022 05:34   ECHOCARDIOGRAM COMPLETE BUBBLE STUDY  Result Date: 02/09/2022    ECHOCARDIOGRAM REPORT   Patient Name:   Storm Frisk Date of Exam: 02/09/2022 Medical Rec #:  [id number]              Height:       65.0 in Accession #:    [id number]             Weight:       285.1 lb Date of Birth:  [birth year]              BSA:          2.300 m Patient Age:    89 years               BP:           119/83 mmHg Patient Gender: M                      HR:           95 bpm. Exam Location:  Inpatient Procedure: 2D Echo, Cardiac Doppler, Color Doppler and Saline Contrast Bubble            Study Indications:    CHF  History:        Patient has prior history of Echocardiogram examinations, most                 recent 04/12/2018. Risk Factors:Hypertension.  Sonographer:    Jefferey Pica Referring Phys: [id number] [place]  1. Left ventricular ejection fraction, by estimation, is 30 to 35%. The left ventricle has moderately decreased function. The left ventricle demonstrates global hypokinesis. Left ventricular diastolic parameters are indeterminate.  2. Right ventricular systolic function is normal. The right ventricular size is normal.  3. The mitral valve is normal in structure. Mild to moderate mitral valve regurgitation. No evidence of mitral stenosis.  4. The aortic valve is normal in structure. Aortic valve regurgitation is not visualized. No aortic stenosis is present.  5. There is mild dilatation  of the ascending aorta, measuring 38 mm.  6. The inferior vena cava is normal in size with greater than 50% respiratory variability, suggesting right atrial pressure of 3 mmHg. FINDINGS  Left Ventricle: Left ventricular ejection fraction, by estimation, is 30 to 35%. The left ventricle has moderately decreased function. The left ventricle demonstrates global hypokinesis. The left ventricular internal cavity size was normal in size. There is no left ventricular hypertrophy. Left ventricular diastolic parameters are indeterminate. Right Ventricle: The right ventricular size is normal. No increase in right ventricular wall thickness. Right ventricular systolic function is normal. Left Atrium: Left atrial size was normal in size. Right Atrium: Right atrial size was normal in size. Pericardium: There is no evidence of pericardial effusion. Mitral Valve: The mitral valve is normal in structure. Mild to moderate mitral valve regurgitation. No evidence of mitral valve stenosis. Tricuspid Valve: The tricuspid valve is normal in structure. Tricuspid valve regurgitation is not demonstrated. No evidence of tricuspid stenosis. Aortic Valve: The aortic valve is normal in structure. Aortic valve regurgitation is not visualized. No aortic stenosis is present. Aortic valve peak gradient measures 8.0 mmHg. Pulmonic Valve: The pulmonic valve was normal in structure. Pulmonic valve regurgitation is not visualized. No evidence of pulmonic stenosis. Aorta: There is mild dilatation of the ascending aorta, measuring 38 mm. Venous: The inferior vena cava is normal in  size with greater than 50% respiratory variability, suggesting right atrial pressure of 3 mmHg. IAS/Shunts: No atrial level shunt detected by color flow Doppler. Agitated saline contrast was given intravenously to evaluate for intracardiac shunting.  LEFT VENTRICLE PLAX 2D LVIDd:         6.10 cm      Diastology LVIDs:         5.00 cm      LV e' lateral: 6.68 cm/s LV PW:          1.80 cm LV IVS:        1.70 cm LVOT diam:     2.40 cm LV SV:         76 LV SV Index:   33 LVOT Area:     4.52 cm  LV Volumes (MOD) LV vol d, MOD A4C: 247.0 ml LV vol s, MOD A4C: 152.0 ml LV SV MOD A4C:     247.0 ml RIGHT VENTRICLE             IVC RV S prime:     10.20 cm/s  IVC diam: 2.70 cm TAPSE (M-mode): 1.9 cm LEFT ATRIUM              Index        RIGHT ATRIUM           Index LA diam:        4.80 cm  2.09 cm/m   RA Area:     22.20 cm LA Vol (A2C):   98.1 ml  42.66 ml/m  RA Volume:   67.30 ml  29.27 ml/m LA Vol (A4C):   131.0 ml 56.97 ml/m LA Biplane Vol: 124.0 ml 53.92 ml/m  AORTIC VALVE                 PULMONIC VALVE AV Area (Vmax): 4.09 cm     PV Vmax:       0.80 m/s AV Vmax:        141.50 cm/s  PV Peak grad:  2.6 mmHg AV Peak Grad:   8.0 mmHg LVOT Vmax:      128.00 cm/s LVOT Vmean:     74.000 cm/s LVOT VTI:       0.169 m  AORTA Ao Root diam: 3.50 cm Ao Asc diam:  3.80 cm MR Peak grad: 99.6 mmHg MR Vmax:      499.00 cm/s SHUNTS                           Systemic VTI:  0.17 m                           Systemic Diam: 2.40 cm Kardie Tobb DO Electronically signed by Berniece Salines DO Signature Date/Time: 02/09/2022/1:36:18 PM    Final    CT Chest W Contrast  Result Date: 02/09/2022 CLINICAL DATA:  Pneumonia, complication suspected EXAM: CT CHEST WITH CONTRAST TECHNIQUE: Multidetector CT imaging of the chest was performed during intravenous contrast administration. RADIATION DOSE REDUCTION: This exam was performed according to the departmental dose-optimization program which includes automated exposure control, adjustment of the mA and/or kV according to patient size and/or use of iterative reconstruction technique. CONTRAST:  72m OMNIPAQUE IOHEXOL 300 MG/ML  SOLN COMPARISON:  Chest radiograph done earlier today FINDINGS: Cardiovascular: Heart is enlarged in size. There is minimal pericardial effusion. There is homogeneous enhancement in thoracic aorta. Contrast density in the pulmonary artery  branches is  less than optimal. There is no evidence of any large filling defects in the central pulmonary artery branches. Mediastinum/Nodes: There are enlarged lymph nodes in the mediastinum largest in the subcarinal region measuring approximally 2 x 1.7 cm. Thyroid appears smaller than usual in size. Lungs/Pleura: Extensive patchy alveolar infiltrates are seen in both lungs. There are no cavitary lesions. There is no significant thickening of interlobular septi. There is minimal right pleural effusion. There is no pneumothorax. Upper Abdomen: Unremarkable. Musculoskeletal: Unremarkable. IMPRESSION: Extensive patchy alveolar infiltrates are seen in both lungs suggesting possible multifocal pneumonia. There are no cavitary lesions in the lung fields. There are enlarged lymph nodes in the mediastinum, possibly suggesting reactive hyperplasia. Follow-up CT chest in 3 months should be considered to assess resolution and to rule out any neoplastic process in the lymph nodes. Part of this finding may suggest pulmonary edema. There is minimal right pleural effusion. Heart is enlarged in size. Electronically Signed   By: Elmer Picker M.D.   On: 02/09/2022 08:19   US Abdomen Limited RUQ (LIVER/GB)  Result Date: 02/09/2022 CLINICAL DATA:  41 year old male with abnormal LFTs. EXAM: ULTRASOUND ABDOMEN LIMITED RIGHT UPPER QUADRANT COMPARISON:  None Available. FINDINGS: Gallbladder: No gallstones or wall thickening visualized. No sonographic Murphy sign noted by sonographer. Common bile duct: Diameter: 5 mm, normal. Liver: No focal lesion identified. Within normal limits in parenchymal echogenicity (image 31). Portal vein is patent on color Doppler imaging with normal direction of blood flow towards the liver. Other: Negative visible right kidney. IMPRESSION: Negative right upper quadrant ultrasound. Electronically Signed   By: Genevie Ann M.D.   On: 02/09/2022 07:36   DG Chest Port 1 View  Result Date: 02/09/2022 CLINICAL DATA:   41 year old male with history of severe shortness of breath. EXAM: PORTABLE CHEST 1 VIEW COMPARISON:  Chest x-ray 03/04/2018. FINDINGS: There is marked cephalization of the pulmonary vasculature, severe indistinctness of the interstitial markings, and extensive multifocal airspace disease throughout the lungs bilaterally suggestive of severe pulmonary edema. Small bilateral pleural effusions. No pneumothorax. Severe cardiac enlargement. Upper mediastinal contours are within normal limits. IMPRESSION: 1. The appearance the chest is most suggestive of severe congestive heart failure. Strictly speaking, the possibility of severe multilobar bilateral pneumonia is not excluded, but not strongly favored. Electronically Signed   By: Vinnie Langton M.D.   On: 02/09/2022 05:33      Today   Subjective    Danthony Kendrix today has no headache,no chest abdominal pain,no new weakness tingling or numbness, feels much better wants to go home today.     Objective   Blood pressure  130/90, pulse 81, temperature 98 F (36.7 C), temperature source Oral, resp. rate 17, height 5' 5"  (1.651 m), weight 99.2 kg, SpO2 93 %.   Intake/Output Summary (Last 24 hours) at 02/21/2022 1111 Last data filed at 02/21/2022 0526 Gross per 24 hour  Intake 120 ml  Output 1800 ml  Net -1680 ml    Exam  Awake Alert, No new F.N deficits,    [place].AT,PERRAL Supple Neck,   Symmetrical Chest wall movement, Good air movement bilaterally, CTAB RRR,No Gallops,   +ve B.Sounds, Abd Soft, Non tender,  No Cyanosis, Clubbing or edema    Data Review   Recent Labs  Lab 02/15/22 0342 02/16/22 0414 02/16/22 1425 02/16/22 1433 02/17/22 0611  WBC 9.5 7.5  --   --  9.8  HGB 15.3 16.4 17.0 17.0 16.0  HCT 46.8 50.2 50.0 50.0 48.2  PLT  413* 432*  --   --  393  MCV 89.8 87.5  --   --  88.3  MCH 29.4 28.6  --   --  29.3  MCHC 32.7 32.7  --   --  33.2  RDW 13.3 13.7  --   --  13.6  LYMPHSABS 1.9 1.6  --   --   --   MONOABS 0.8 0.6   --   --   --   EOSABS 0.5 0.3  --   --   --   BASOSABS 0.0 0.0  --   --   --     Recent Labs  Lab 02/14/22 1200 02/15/22 0342 02/15/22 0342 02/16/22 0414 02/16/22 1425 02/17/22 0611 02/18/22 0130 02/19/22 0022 02/20/22 0119 02/21/22 0144  NA  --  136   < > 134*   < > 135 133* 134* 133* 133*  K  --  4.4   < > 3.9   < > 4.7 4.3 4.5 4.4 4.3  CL  --  93*   < > 95*  --  97* 103 102 106 103  CO2  --  33*   < > 31  --  27 23 23  20* 20*  GLUCOSE  --  118*   < > 138*  --  112* 91 127* 131* 101*  BUN  --  41*   < > 45*  --  44* 31* 29* 24* 20  CREATININE  --  1.28*   < > 1.44*  --  1.54* 1.37* 1.47* 1.44* 1.39*  CALCIUM  --  9.4   < > 9.7  --  9.4 9.0 8.9 8.5* 9.1  AST  --  26   < > 21  --   --  21 21 25 26   ALT  --  37   < > 33  --   --  30 25 27 29   ALKPHOS  --  97   < > 102  --   --  87 78 80 81  BILITOT  --  0.6   < > 0.9  --   --  0.9 0.7 0.6 0.9  ALBUMIN  --  2.4*   < > 2.5*  --   --  2.6* 2.5* 2.5* 2.6*  MG  --  2.1  --  2.4  --  2.2  --   --   --   --   CRP 4.3* 2.5*  --  1.8*  --   --   --   --   --   --   BNP  --  495.3*  --   --   --  193.8*  --   --   --   --    < > = values in this interval not displayed.   Lab Results  Component Value Date   HGBA1C 9.9 (H) 02/10/2022   CBG (last 3)  Recent Labs    02/20/22 1558 02/20/22 2116 02/21/22 0749  GLUCAP 117* 117* 124*    Total Time in preparing paper work, data evaluation and todays exam - 2 minutes  Lala Lund M.D on 02/21/2022 at 11:11 AM  [facility]

## 2022-02-28 NOTE — Progress Notes (Unsigned)
Subjective:    Justin [name] - 41 y.o. male MRN [id number]  Date of birth: 06-03-1981  HPI  Justin [name] is to establish care and hospital discharge follow-up.  Current issues and/or concerns: Hospital discharge follow-up: 02/09/2022 - 02/21/2022 [facility] per MD note: Recommendations for Outpatient Follow-up:    Follow up with PCP in 1-2 weeks   PCP Please obtain BMP/CBC, 2 view CXR in 1week,  (see Discharge instructions)    PCP Please follow up on the following pending results:    Discharge diagnosis: Principal Problem:   Acute hypoxemic respiratory failure (HCC) Active Problems:   Dyspnea   Community acquired pneumonia   Nonrheumatic mitral valve regurgitation   Acute systolic CHF (congestive heart failure) (HCC)   Primary hypertension   Cardiomyopathy due to hypertension, with heart failure [facility])   Ventricular tachycardia [facility])   Hospital course: Acute hypoxic respiratory failure due to pulmonary edema and questionable atypical pneumonia. he was initially on BiPAP, has been diuresed and now on stable on room air for several days, he has been seen by cardiology and GI.  He underwent echocardiogram which suggested an EF of 35%, followed by cardiac MRI showing some structural changes suspicious for underlying sarcoidosis.   Cardiology and pulmonary has seen and cleared him for discharge for now, per cardiology he will be discharged on high-dose Coreg, Entresto along with Aldactone.  In the past he has been noncompliant with his cardiology appointments and he has been counseled for that.  He also underwent an unremarkable left heart catheterization with no significant CAD.  Plan is close outpatient cardiology and pulmonary follow-up postdischarge.  PCP to arrange.  He will get 30-day supplies of his medications through [facility] pharmacy upon discharge.  Currently he is symptom-free  03/27/2022 [name], MD at Pulmonology   Acute on chronic systolic  heart failure.  Kindly see #1 above.  .  03/28/2022 [name], PA at Cardiology  Go to [facility] (Cardiology) in 11 days (03/04/2022);  AKI on CKD stage IIIa.  Baseline creatinine around 1.3, monitor, improved after IV fluids on 02/17/2022, repeat 02/19/22.  ECP to monitor renal function closely on Entresto and Aldactone combination   CT chest revealing mediastinal lymphadenopathy.  Outpatient follow-up with pulmonary, needs repeat CT scan within 3 months.  High-resolution CT and cardiac MRI noted needs to follow-up with pulmonary within 1 to 2 weeks of discharge.  Has finished course for presumed pneumonia.  Of note his ACE inhibitor levels are normal  03/27/2022 [name], MD at Pulmonology   History of hypertension.  Stable on current combination of Coreg, Entresto and Aldactone combination.  PCP to monitor and adjust  Go to [facility] (Cardiology) in 11 days (03/04/2022);  Mild asymptomatic transaminitis due to hepatic congestion.  Almost resolved, right upper quadrant ultrasound stable.  Much improved check CMP in 7 to 10 days in the outpatient setting.   Morbid obesity.  BMI 47.  Outpatient follow-up with PCP for weight loss.   DM type II.  Received DM and insulin education, diabetic coordinator not confident that patient will be able to take insulin at home hence placed on metformin, Farxiga and Amaryl combination.  CBGs for now stable.  Testing supplies in 1 month supply of medications will be provided.  Close outpatient monitoring of A1c and CBGs by PCP.  F/u 1 month DM    Follow-Ups  Schedule an appointment with  [name], MD (Pulmonary Disease) in 2 weeks (03/07/2022) Schedule an appointment with [name], MD (Cardiology) in 1 week (02/28/2022) Go to [facility] (Cardiology) in 11 days (03/04/2022); Hospital followw up  PLEASE bring a  current list of medications to appointment  FREE valet parking, Entrance C, off of [employer]  Today's visit:      ROS per HPI     Health Maintenance:  Health Maintenance Due  Topic Date Due   COVID-19 Vaccine (1) Never done   HIV Screening  Never done   Hepatitis C Screening  Never done   TETANUS/TDAP  Never done     Past Medical History: Patient Active Problem List   Diagnosis Date Noted   Ventricular tachycardia (HCC)    Primary hypertension    Cardiomyopathy due to hypertension, with heart failure (HCC)    Acute systolic CHF (congestive heart failure) (HCC)    Nonrheumatic mitral valve regurgitation    Dyspnea    Community acquired pneumonia    Acute hypoxemic respiratory failure (HCC) 02/09/2022   Hypertriglyceridemia 04/18/2018   Nonischemic cardiomyopathy (HCC) 03/29/2018   Chronic diastolic heart failure (HCC) 03/29/2018   Essential hypertension 03/29/2018      Social History   reports that he has never smoked. He has never used smokeless tobacco. He reports current alcohol use of about 30.0 standard drinks of alcohol per week. He reports current drug use. Drug: "Crack" cocaine.   Family History  family history includes Hypertension in his father.   Medications: reviewed and updated   Objective:   Physical Exam There were no vitals taken for this visit. Physical Exam      Assessment & Plan:         Patient was given clear instructions to go to Emergency Department or return to medical center if symptoms don't improve, worsen, or new problems develop.The patient verbalized understanding.  I discussed the assessment and treatment plan with the patient. The patient was provided an opportunity to ask questions and all were answered. The patient agreed with the plan and demonstrated an understanding of the instructions.   The patient was advised to call back or seek an in-person evaluation if the symptoms worsen or if the condition fails  to improve as anticipated.    [name], NP 02/28/2022, 8:17 AM Primary Care at [facility]

## 2022-03-03 ENCOUNTER — Encounter (HOSPITAL_COMMUNITY): Payer: Self-pay

## 2022-03-03 ENCOUNTER — Telehealth (HOSPITAL_COMMUNITY): Payer: Self-pay | Admitting: *Deleted

## 2022-03-06 ENCOUNTER — Encounter: Payer: Self-pay | Admitting: Family

## 2022-03-06 ENCOUNTER — Telehealth (HOSPITAL_COMMUNITY): Payer: Self-pay

## 2022-03-06 DIAGNOSIS — I340 Nonrheumatic mitral (valve) insufficiency: Secondary | ICD-10-CM

## 2022-03-06 DIAGNOSIS — J9601 Acute respiratory failure with hypoxia: Secondary | ICD-10-CM

## 2022-03-06 DIAGNOSIS — I5021 Acute systolic (congestive) heart failure: Secondary | ICD-10-CM

## 2022-03-06 DIAGNOSIS — I43 Cardiomyopathy in diseases classified elsewhere: Secondary | ICD-10-CM

## 2022-03-06 DIAGNOSIS — I472 Ventricular tachycardia, unspecified: Secondary | ICD-10-CM

## 2022-03-06 DIAGNOSIS — Z09 Encounter for follow-up examination after completed treatment for conditions other than malignant neoplasm: Secondary | ICD-10-CM

## 2022-03-06 DIAGNOSIS — J189 Pneumonia, unspecified organism: Secondary | ICD-10-CM

## 2022-03-06 DIAGNOSIS — R0600 Dyspnea, unspecified: Secondary | ICD-10-CM

## 2022-03-06 DIAGNOSIS — Z789 Other specified health status: Secondary | ICD-10-CM

## 2022-03-06 DIAGNOSIS — I10 Essential (primary) hypertension: Secondary | ICD-10-CM

## 2022-03-06 DIAGNOSIS — Z7689 Persons encountering health services in other specified circumstances: Secondary | ICD-10-CM

## 2022-03-21 NOTE — Progress Notes (Deleted)
PCP:  Patient, No Pcp Per Primary Cardiologist: Buford Dresser, MD Electrophysiologist: None   Justin [name] is a 41 y.o. male seen today for None for routine electrophysiology followup.  Since last being seen in our clinic the patient reports doing ***.  he denies chest pain, palpitations, dyspnea, PND, orthopnea, nausea, vomiting, dizziness, syncope, edema, weight gain, or early satiety.  Past Medical History:  Diagnosis Date   Heart failure with reduced ejection fraction ([place]) 2017   [facility]   Hypertension 2017   [facility]   Past Surgical History:  Procedure Laterality Date   RIGHT/LEFT HEART CATH AND CORONARY ANGIOGRAPHY N/A 02/16/2022   Procedure: RIGHT/LEFT HEART CATH AND CORONARY ANGIOGRAPHY;  Surgeon: Sherren Mocha, MD;  Location: [facility];  Service: Cardiovascular;  Laterality: N/A;    Current Outpatient Medications  Medication Sig Dispense Refill   Accu-Chek Softclix Lancets lancets use to check blood sugar up to four times daily 100 each 0   acetaminophen (TYLENOL) 500 MG tablet Take 1,000 mg by mouth every 6 (six) hours as needed for mild pain.     blood glucose meter kit and supplies KIT Dispense based on patient and insurance preference. Use up to four times daily as directed. (FOR ICD-9 250.00, 250.01). For QAC - HS accuchecks. 1 each 1   blood glucose meter kit and supplies use to check blood sugars 4 times daily 1 each 0   carvedilol (COREG) 25 MG tablet Take 1 tablet (25 mg total) by mouth 2 (two) times daily with a meal. 60 tablet 0   dapagliflozin propanediol (FARXIGA) 10 MG TABS tablet Take 1 tablet (10 mg total) by mouth daily. 30 tablet 0   fenofibrate 54 MG tablet Take 1 tablet (54 mg total) by mouth daily. 30 tablet 0   glimepiride (AMARYL) 2 MG tablet Take 2 tablets (4 mg total) by mouth daily with breakfast. 60 tablet 0   glucose blood (ACCU-CHEK GUIDE) test strip Use to test 4 (four) times daily -  before  meals and at bedtime. 100 strip 0   metFORMIN (GLUCOPHAGE) 500 MG tablet Take 1 tablet (500 mg total) by mouth 2 (two) times daily with a meal. 60 tablet 0   sacubitril-valsartan (ENTRESTO) 24-26 MG Take 1 tablet by mouth 2 (two) times daily. 60 tablet 0   spironolactone (ALDACTONE) 25 MG tablet Take 1/2 tablet (12.5 mg total) by mouth daily. 30 tablet 0   No current facility-administered medications for this visit.    No Known Allergies  Social History   Socioeconomic History   Marital status: Single    Spouse name: Not on file   Number of children: 3   Years of education: Not on file   Highest education level: 7th grade  Occupational History   Occupation: Curator    Comment: varies  Tobacco Use   Smoking status: Never   Smokeless tobacco: Never  Vaping Use   Vaping Use: Never used  Substance and Sexual Activity   Alcohol use: Yes    Alcohol/week: 30.0 standard drinks of alcohol    Types: 30 Cans of beer per week    Comment: 30-40 on weekends   Drug use: Yes    Types: "Crack" cocaine    Comment: 2 weeks   Sexual activity: Not on file  Other Topics Concern   Not on file  Social History Narrative   Not on file   Social Determinants of Health   Financial Resource Strain:  High Risk (02/20/2022)   Overall Financial Resource Strain (CARDIA)    Difficulty of Paying Living Expenses: Hard  Food Insecurity: No Food Insecurity (02/20/2022)   Hunger Vital Sign    Worried About Running Out of Food in the Last Year: Never true    Ran Out of Food in the Last Year: Never true  Transportation Needs: No Transportation Needs (02/20/2022)   PRAPARE - Hydrologist (Medical): No    Lack of Transportation (Non-Medical): No  Physical Activity: Not on file  Stress: Not on file  Social Connections: Not on file  Intimate Partner Violence: Not on file     Review of Systems: All other systems reviewed and are otherwise negative except as noted  above.  Physical Exam: There were no vitals filed for this visit.  GEN- The patient is well appearing, alert and oriented x 3 today.   HEENT: normocephalic, atraumatic; sclera clear, conjunctiva pink; hearing intact; oropharynx clear; neck supple, no JVP Lymph- no cervical lymphadenopathy Lungs- Clear to ausculation bilaterally, normal work of breathing.  No wheezes, rales, rhonchi Heart- Regular rate and rhythm, no murmurs, rubs or gallops, PMI not laterally displaced GI- soft, non-tender, non-distended, bowel sounds present, no hepatosplenomegaly Extremities- no clubbing, cyanosis, or edema; DP/PT/radial pulses 2+ bilaterally MS- no significant deformity or atrophy Skin- warm and dry, no rash or lesion Psych- euthymic mood, full affect Neuro- strength and sensation are intact  EKG is not ordered.   Additional studies reviewed include: Previous EP office notes.   Assessment and Plan:  1. Chronic systolic heart failure due to nonischemic cardiomyopathy:  Echo 02/09/2022 30-35%.   Left and right heart catheterization showed low filling pressures and borderline cardiac output but no coronary disease.   cMRI 02/20/2022 showed EF 29% and met criteria for hypertrophic cardiomyopathy, also somewhat suggestive of  Continue optimal medical therapy for heart failure.   He needs follow up with HF clinic, missed prior appointment.    2.  Ventricular tachycardia: Will need GDMT and then consideration of ICD pending EF in 3-4 months.     3.  Hypertension Adjust meds in setting of GDMT.     Follow up with {Blank single:19197::"Dr. Allred","Dr. Arlan Organ. Klein","Dr. Camnitz","Dr. Lambert","EP APP"} in {Blank single:19197::"2 weeks","4 weeks","3 months","6 months","12 months","as usual post gen change"}   Shirley Friar, PA-C  03/21/22 10:14 AM

## 2022-03-27 ENCOUNTER — Inpatient Hospital Stay: Payer: Self-pay | Admitting: Pulmonary Disease

## 2022-03-28 ENCOUNTER — Ambulatory Visit: Payer: Self-pay | Admitting: Student

## 2022-03-28 DIAGNOSIS — I472 Ventricular tachycardia, unspecified: Secondary | ICD-10-CM

## 2022-03-28 DIAGNOSIS — I428 Other cardiomyopathies: Secondary | ICD-10-CM

## 2022-03-28 DIAGNOSIS — I5032 Chronic diastolic (congestive) heart failure: Secondary | ICD-10-CM

## 2022-03-28 DIAGNOSIS — I10 Essential (primary) hypertension: Secondary | ICD-10-CM

## 2022-03-28 NOTE — Telephone Encounter (Signed)
Pt also no showed visit at [facility] today. Unable to complete/submit [place] pt assistance.

## 2022-05-09 ENCOUNTER — Other Ambulatory Visit: Payer: Self-pay

## 2022-06-02 ENCOUNTER — Other Ambulatory Visit: Payer: Self-pay

## 2022-06-02 ENCOUNTER — Encounter (HOSPITAL_COMMUNITY): Payer: Self-pay | Admitting: Emergency Medicine

## 2022-06-02 ENCOUNTER — Emergency Department (HOSPITAL_COMMUNITY): Payer: Self-pay

## 2022-06-02 ENCOUNTER — Inpatient Hospital Stay (HOSPITAL_COMMUNITY): Payer: Self-pay

## 2022-06-02 ENCOUNTER — Inpatient Hospital Stay (HOSPITAL_COMMUNITY)
Admission: EM | Admit: 2022-06-02 | Discharge: 2022-06-06 | DRG: 291 | Disposition: A | Discharge status: Home / Self Care | Payer: Self-pay | Attending: Internal Medicine | Admitting: Internal Medicine

## 2022-06-02 DIAGNOSIS — I10 Essential (primary) hypertension: Secondary | ICD-10-CM | POA: Diagnosis present

## 2022-06-02 DIAGNOSIS — T502X5A Adverse effect of carbonic-anhydrase inhibitors, benzothiadiazides and other diuretics, initial encounter: Secondary | ICD-10-CM | POA: Diagnosis not present

## 2022-06-02 DIAGNOSIS — I422 Other hypertrophic cardiomyopathy: Secondary | ICD-10-CM | POA: Diagnosis present

## 2022-06-02 DIAGNOSIS — I248 Other forms of acute ischemic heart disease: Secondary | ICD-10-CM | POA: Diagnosis present

## 2022-06-02 DIAGNOSIS — E1122 Type 2 diabetes mellitus with diabetic chronic kidney disease: Secondary | ICD-10-CM | POA: Diagnosis present

## 2022-06-02 DIAGNOSIS — Z8249 Family history of ischemic heart disease and other diseases of the circulatory system: Secondary | ICD-10-CM

## 2022-06-02 DIAGNOSIS — N182 Chronic kidney disease, stage 2 (mild): Secondary | ICD-10-CM | POA: Diagnosis present

## 2022-06-02 DIAGNOSIS — I472 Ventricular tachycardia, unspecified: Secondary | ICD-10-CM | POA: Diagnosis present

## 2022-06-02 DIAGNOSIS — E785 Hyperlipidemia, unspecified: Secondary | ICD-10-CM | POA: Diagnosis present

## 2022-06-02 DIAGNOSIS — J9601 Acute respiratory failure with hypoxia: Secondary | ICD-10-CM

## 2022-06-02 DIAGNOSIS — N179 Acute kidney failure, unspecified: Secondary | ICD-10-CM

## 2022-06-02 DIAGNOSIS — F1410 Cocaine abuse, uncomplicated: Secondary | ICD-10-CM | POA: Diagnosis present

## 2022-06-02 DIAGNOSIS — R7989 Other specified abnormal findings of blood chemistry: Secondary | ICD-10-CM

## 2022-06-02 DIAGNOSIS — Z5989 Other problems related to housing and economic circumstances: Secondary | ICD-10-CM

## 2022-06-02 DIAGNOSIS — Z597 Insufficient social insurance and welfare support: Secondary | ICD-10-CM

## 2022-06-02 DIAGNOSIS — E66813 Obesity, class 3: Secondary | ICD-10-CM | POA: Diagnosis present

## 2022-06-02 DIAGNOSIS — I5022 Chronic systolic (congestive) heart failure: Secondary | ICD-10-CM | POA: Diagnosis present

## 2022-06-02 DIAGNOSIS — E871 Hypo-osmolality and hyponatremia: Secondary | ICD-10-CM | POA: Diagnosis present

## 2022-06-02 DIAGNOSIS — I5023 Acute on chronic systolic (congestive) heart failure: Secondary | ICD-10-CM | POA: Diagnosis present

## 2022-06-02 DIAGNOSIS — Z7984 Long term (current) use of oral hypoglycemic drugs: Secondary | ICD-10-CM

## 2022-06-02 DIAGNOSIS — J9621 Acute and chronic respiratory failure with hypoxia: Secondary | ICD-10-CM | POA: Diagnosis present

## 2022-06-02 DIAGNOSIS — Z91141 Patient's other noncompliance with medication regimen due to financial hardship: Secondary | ICD-10-CM

## 2022-06-02 DIAGNOSIS — F1010 Alcohol abuse, uncomplicated: Secondary | ICD-10-CM | POA: Diagnosis present

## 2022-06-02 DIAGNOSIS — Z79899 Other long term (current) drug therapy: Secondary | ICD-10-CM

## 2022-06-02 DIAGNOSIS — F1910 Other psychoactive substance abuse, uncomplicated: Secondary | ICD-10-CM | POA: Diagnosis present

## 2022-06-02 DIAGNOSIS — Z6841 Body Mass Index (BMI) 40.0 and over, adult: Secondary | ICD-10-CM

## 2022-06-02 DIAGNOSIS — N189 Chronic kidney disease, unspecified: Secondary | ICD-10-CM

## 2022-06-02 DIAGNOSIS — E1165 Type 2 diabetes mellitus with hyperglycemia: Secondary | ICD-10-CM | POA: Diagnosis present

## 2022-06-02 DIAGNOSIS — I340 Nonrheumatic mitral (valve) insufficiency: Secondary | ICD-10-CM | POA: Diagnosis present

## 2022-06-02 DIAGNOSIS — I130 Hypertensive heart and chronic kidney disease with heart failure and stage 1 through stage 4 chronic kidney disease, or unspecified chronic kidney disease: Principal | ICD-10-CM | POA: Diagnosis present

## 2022-06-02 DIAGNOSIS — R778 Other specified abnormalities of plasma proteins: Secondary | ICD-10-CM

## 2022-06-02 DIAGNOSIS — E1169 Type 2 diabetes mellitus with other specified complication: Secondary | ICD-10-CM | POA: Diagnosis present

## 2022-06-02 DIAGNOSIS — N289 Disorder of kidney and ureter, unspecified: Secondary | ICD-10-CM

## 2022-06-02 LAB — COMPREHENSIVE METABOLIC PANEL
ALT: 17 U/L (ref 0–44)
AST: 31 U/L (ref 15–41)
Albumin: 3.1 g/dL — ABNORMAL LOW (ref 3.5–5.0)
Alkaline Phosphatase: 61 U/L (ref 38–126)
Anion gap: 11 (ref 5–15)
BUN: 22 mg/dL — ABNORMAL HIGH (ref 6–20)
CO2: 26 mmol/L (ref 22–32)
Calcium: 9.6 mg/dL (ref 8.9–10.3)
Chloride: 100 mmol/L (ref 98–111)
Creatinine, Ser: 1.49 mg/dL — ABNORMAL HIGH (ref 0.61–1.24)
GFR, Estimated: 60 mL/min — ABNORMAL LOW (ref >60)
Glucose, Bld: 202 mg/dL — ABNORMAL HIGH (ref 70–99)
Potassium: 4 mmol/L (ref 3.5–5.1)
Sodium: 137 mmol/L (ref 135–145)
Total Bilirubin: 0.9 mg/dL (ref 0.3–1.2)
Total Protein: 6.3 g/dL — ABNORMAL LOW (ref 6.5–8.1)

## 2022-06-02 LAB — ECHOCARDIOGRAM COMPLETE
Area-P 1/2: 7.09 cm2
Calc EF: 27.3 %
Height: 65 in
MV M vel: 5.39 m/s
MV Peak grad: 116.2 mmHg
Radius: 0.9 cm
S' Lateral: 4.6 cm
Single Plane A2C EF: 27.3 %
Single Plane A4C EF: 23.8 %
Weight: 3488 oz

## 2022-06-02 LAB — LACTIC ACID, PLASMA: Lactic Acid, Venous: 1.7 mmol/L (ref 0.5–1.9)

## 2022-06-02 LAB — HEMOGLOBIN A1C
Hgb A1c MFr Bld: 7.5 % — ABNORMAL HIGH (ref 4.8–5.6)
Mean Plasma Glucose: 168.55 mg/dL

## 2022-06-02 LAB — CBC WITH DIFFERENTIAL/PLATELET
Abs Immature Granulocytes: 0.06 10*3/uL (ref 0.00–0.07)
Basophils Absolute: 0.1 10*3/uL (ref 0.0–0.1)
Basophils Relative: 0 %
Eosinophils Absolute: 0.2 10*3/uL (ref 0.0–0.5)
Eosinophils Relative: 1 %
HCT: 48.4 % (ref 39.0–52.0)
Hemoglobin: 16.3 g/dL (ref 13.0–17.0)
Immature Granulocytes: 1 %
Lymphocytes Relative: 11 %
Lymphs Abs: 1.3 10*3/uL (ref 0.7–4.0)
MCH: 30.3 pg (ref 26.0–34.0)
MCHC: 33.7 g/dL (ref 30.0–36.0)
MCV: 90 fL (ref 80.0–100.0)
Monocytes Absolute: 0.8 10*3/uL (ref 0.1–1.0)
Monocytes Relative: 7 %
Neutro Abs: 9 10*3/uL — ABNORMAL HIGH (ref 1.7–7.7)
Neutrophils Relative %: 80 %
Platelets: 259 10*3/uL (ref 150–400)
RBC: 5.38 MIL/uL (ref 4.22–5.81)
RDW: 14.6 % (ref 11.5–15.5)
WBC: 11.4 10*3/uL — ABNORMAL HIGH (ref 4.0–10.5)
nRBC: 0 % (ref 0.0–0.2)

## 2022-06-02 LAB — LIPID PANEL
Cholesterol: 272 mg/dL — ABNORMAL HIGH (ref 0–200)
HDL: 47 mg/dL (ref >40)
LDL Cholesterol: 173 mg/dL — ABNORMAL HIGH (ref 0–99)
Total CHOL/HDL Ratio: 5.8 RATIO
Triglycerides: 261 mg/dL — ABNORMAL HIGH (ref <150)
VLDL: 52 mg/dL — ABNORMAL HIGH (ref 0–40)

## 2022-06-02 LAB — BRAIN NATRIURETIC PEPTIDE: B Natriuretic Peptide: 551.9 pg/mL — ABNORMAL HIGH (ref 0.0–100.0)

## 2022-06-02 LAB — GLUCOSE, CAPILLARY
Glucose-Capillary: 146 mg/dL — ABNORMAL HIGH (ref 70–99)
Glucose-Capillary: 169 mg/dL — ABNORMAL HIGH (ref 70–99)
Glucose-Capillary: 133 mg/dL — ABNORMAL HIGH (ref 70–99)

## 2022-06-02 LAB — TROPONIN I (HIGH SENSITIVITY)
Troponin I (High Sensitivity): 143 ng/L (ref <18)
Troponin I (High Sensitivity): 165 ng/L (ref <18)
Troponin I (High Sensitivity): 146 ng/L (ref <18)
Troponin I (High Sensitivity): 105 ng/L (ref <18)

## 2022-06-02 LAB — ETHANOL: Alcohol, Ethyl (B): <10 mg/dL (ref <10)

## 2022-06-02 LAB — HIV ANTIBODY (ROUTINE TESTING W REFLEX): HIV Screen 4th Generation wRfx: NONREACTIVE

## 2022-06-02 MED ORDER — ENOXAPARIN SODIUM 40 MG/0.4ML IJ SOSY
40.0000 mg | PREFILLED_SYRINGE | INTRAMUSCULAR | Status: DC
  Administered 2022-06-02 – 2022-06-05 (×4): 40 mg via SUBCUTANEOUS
  Filled 2022-06-02 (×4): qty 0.4

## 2022-06-02 MED ORDER — POLYETHYLENE GLYCOL 3350 17 G PO PACK: 17.0000 g | PACK | Freq: Every day | ORAL | Status: DC | PRN

## 2022-06-02 MED ORDER — NITROGLYCERIN 2 % TD OINT
1.0000 [in_us] | TOPICAL_OINTMENT | Freq: Once | TRANSDERMAL | Status: AC
  Administered 2022-06-02: 1 [in_us] via TOPICAL
  Filled 2022-06-02: qty 1

## 2022-06-02 MED ORDER — ASPIRIN 81 MG PO CHEW
324.0000 mg | CHEWABLE_TABLET | Freq: Once | ORAL | Status: AC
Start: 2022-06-02 — End: 2022-06-02
  Administered 2022-06-02: 324 mg via ORAL
  Filled 2022-06-02: qty 4

## 2022-06-02 MED ORDER — LORAZEPAM 1 MG PO TABS: 1.0000 mg | ORAL_TABLET | ORAL | Status: DC | PRN

## 2022-06-02 MED ORDER — ONDANSETRON HCL 4 MG/2ML IJ SOLN: 4.0000 mg | Freq: Four times a day (QID) | INTRAMUSCULAR | Status: DC | PRN

## 2022-06-02 MED ORDER — BISACODYL 5 MG PO TBEC: 5.0000 mg | DELAYED_RELEASE_TABLET | Freq: Every day | ORAL | Status: DC | PRN

## 2022-06-02 MED ORDER — SODIUM CHLORIDE 0.9% FLUSH
3.0000 mL | Freq: Two times a day (BID) | INTRAVENOUS | Status: DC
  Administered 2022-06-02 – 2022-06-06 (×9): 3 mL via INTRAVENOUS

## 2022-06-02 MED ORDER — THIAMINE HCL 100 MG/ML IJ SOLN
100.0000 mg | Freq: Every day | INTRAMUSCULAR | Status: DC
  Filled 2022-06-02: qty 2

## 2022-06-02 MED ORDER — THIAMINE MONONITRATE 100 MG PO TABS
100.0000 mg | ORAL_TABLET | Freq: Every day | ORAL | Status: DC
  Administered 2022-06-02 – 2022-06-03 (×2): 100 mg via ORAL
  Filled 2022-06-02 (×3): qty 1

## 2022-06-02 MED ORDER — FUROSEMIDE 10 MG/ML IJ SOLN
80.0000 mg | Freq: Two times a day (BID) | INTRAMUSCULAR | Status: DC
  Administered 2022-06-02 – 2022-06-03 (×2): 80 mg via INTRAVENOUS
  Filled 2022-06-02 (×2): qty 8

## 2022-06-02 MED ORDER — ACETAMINOPHEN 650 MG RE SUPP: 650.0000 mg | Freq: Four times a day (QID) | RECTAL | Status: DC | PRN

## 2022-06-02 MED ORDER — ACETAMINOPHEN 325 MG PO TABS: 650.0000 mg | ORAL_TABLET | Freq: Four times a day (QID) | ORAL | Status: DC | PRN

## 2022-06-02 MED ORDER — SPIRONOLACTONE 25 MG PO TABS
25.0000 mg | ORAL_TABLET | Freq: Every day | ORAL | Status: DC
  Administered 2022-06-03 – 2022-06-06 (×4): 25 mg via ORAL
  Filled 2022-06-02 (×4): qty 1

## 2022-06-02 MED ORDER — FOLIC ACID 1 MG PO TABS
1.0000 mg | ORAL_TABLET | Freq: Every day | ORAL | Status: DC
  Administered 2022-06-02 – 2022-06-06 (×5): 1 mg via ORAL
  Filled 2022-06-02 (×5): qty 1

## 2022-06-02 MED ORDER — FUROSEMIDE 10 MG/ML IJ SOLN: 40.0000 mg | Freq: Two times a day (BID) | INTRAMUSCULAR | Status: DC

## 2022-06-02 MED ORDER — ADULT MULTIVITAMIN W/MINERALS CH
1.0000 | ORAL_TABLET | Freq: Every day | ORAL | Status: DC
  Administered 2022-06-02 – 2022-06-06 (×5): 1 via ORAL
  Filled 2022-06-02 (×5): qty 1

## 2022-06-02 MED ORDER — SPIRONOLACTONE 12.5 MG HALF TABLET
12.5000 mg | ORAL_TABLET | Freq: Every day | ORAL | Status: DC
  Administered 2022-06-02: 12.5 mg via ORAL
  Filled 2022-06-02: qty 1

## 2022-06-02 MED ORDER — LORAZEPAM 2 MG/ML IJ SOLN: 1.0000 mg | INTRAMUSCULAR | Status: DC | PRN

## 2022-06-02 MED ORDER — INSULIN ASPART 100 UNIT/ML IJ SOLN
0.0000 [IU] | Freq: Every day | INTRAMUSCULAR | Status: DC
  Administered 2022-06-04: 3 [IU] via SUBCUTANEOUS

## 2022-06-02 MED ORDER — DOCUSATE SODIUM 100 MG PO CAPS
100.0000 mg | ORAL_CAPSULE | Freq: Two times a day (BID) | ORAL | Status: DC
  Administered 2022-06-02 – 2022-06-06 (×9): 100 mg via ORAL
  Filled 2022-06-02 (×9): qty 1

## 2022-06-02 MED ORDER — ASPIRIN 81 MG PO TBEC
81.0000 mg | DELAYED_RELEASE_TABLET | Freq: Every day | ORAL | Status: DC
  Administered 2022-06-03 – 2022-06-05 (×3): 81 mg via ORAL
  Filled 2022-06-02 (×4): qty 1

## 2022-06-02 MED ORDER — DAPAGLIFLOZIN PROPANEDIOL 10 MG PO TABS
10.0000 mg | ORAL_TABLET | Freq: Every day | ORAL | Status: DC
  Administered 2022-06-03 – 2022-06-05 (×3): 10 mg via ORAL
  Filled 2022-06-02 (×3): qty 1

## 2022-06-02 MED ORDER — SACUBITRIL-VALSARTAN 49-51 MG PO TABS
1.0000 | ORAL_TABLET | Freq: Two times a day (BID) | ORAL | Status: DC
  Administered 2022-06-02 – 2022-06-03 (×3): 1 via ORAL
  Filled 2022-06-02 (×3): qty 1

## 2022-06-02 MED ORDER — ONDANSETRON HCL 4 MG PO TABS: 4.0000 mg | ORAL_TABLET | Freq: Four times a day (QID) | ORAL | Status: DC | PRN

## 2022-06-02 MED ORDER — POTASSIUM CHLORIDE CRYS ER 20 MEQ PO TBCR
40.0000 meq | EXTENDED_RELEASE_TABLET | Freq: Once | ORAL | Status: AC
  Administered 2022-06-02: 40 meq via ORAL
  Filled 2022-06-02: qty 2

## 2022-06-02 MED ORDER — HYDRALAZINE HCL 20 MG/ML IJ SOLN: 5.0000 mg | INTRAMUSCULAR | Status: DC | PRN

## 2022-06-02 MED ORDER — INSULIN ASPART 100 UNIT/ML IJ SOLN
0.0000 [IU] | Freq: Three times a day (TID) | INTRAMUSCULAR | Status: DC
  Administered 2022-06-02: 3 [IU] via SUBCUTANEOUS
  Administered 2022-06-03 (×2): 2 [IU] via SUBCUTANEOUS
  Administered 2022-06-03 – 2022-06-04 (×2): 3 [IU] via SUBCUTANEOUS
  Administered 2022-06-04 (×2): 2 [IU] via SUBCUTANEOUS
  Administered 2022-06-05: 3 [IU] via SUBCUTANEOUS
  Administered 2022-06-05 – 2022-06-06 (×3): 2 [IU] via SUBCUTANEOUS

## 2022-06-02 MED ORDER — FENOFIBRATE 54 MG PO TABS
54.0000 mg | ORAL_TABLET | Freq: Every day | ORAL | Status: DC
  Administered 2022-06-02 – 2022-06-05 (×4): 54 mg via ORAL
  Filled 2022-06-02 (×6): qty 1

## 2022-06-02 MED ORDER — INSULIN ASPART 100 UNIT/ML IJ SOLN: 0.0000 [IU] | Freq: Three times a day (TID) | INTRAMUSCULAR | Status: DC

## 2022-06-02 MED ORDER — FUROSEMIDE 10 MG/ML IJ SOLN
40.0000 mg | Freq: Once | INTRAMUSCULAR | Status: AC
Start: 2022-06-02 — End: 2022-06-02
  Administered 2022-06-02: 40 mg via INTRAVENOUS
  Filled 2022-06-02: qty 4

## 2022-06-02 MED ORDER — SACUBITRIL-VALSARTAN 24-26 MG PO TABS: 1.0000 | ORAL_TABLET | Freq: Two times a day (BID) | ORAL | Status: DC

## 2022-06-02 NOTE — ED Notes (Signed)
ED TO INPATIENT HANDOFF REPORT    S Name/Age/Gender Justin [name] 41 y.o. male Room/Bed: RESUSC/RESUSC  Code Status: Full  Alert and Oriented x4  Chief Complaint Acute CHF (congestive heart failure) ([place]) [I50.9]  Triage Note Patient [place] with EMS from a local restaurant reports SOB this evening with occasional dry cough , history of heart failure with reduced ejection fraction and cardiomyopathy.    Allergies No Known Allergies  Level of Care/Admitting Diagnosis ED Disposition     ED Disposition  Admit   Condition  --   Comment  Hospital Area: [place] [[id number]]  Level of Care: Progressive [102]  Admit to Progressive based on following criteria: CARDIOVASCULAR & THORACIC of moderate stability with acute coronary syndrome symptoms/low risk myocardial infarction/hypertensive urgency/arrhythmias/heart failure potentially compromising stability and stable post cardiovascular intervention patients.  May admit patient to [facility] or [facility] if equivalent level of care is available:: No  Covid Evaluation: Asymptomatic - no recent exposure (last 10 days) testing not required  Diagnosis: Acute CHF (congestive heart failure) [facility]) [id number]  Admitting Physician: Kayleen Memos [id number]  Attending Physician: Kayleen Memos [id number]  Certification:: I certify this patient will need inpatient services for at least 2 midnights  Estimated Length of Stay: 2          B Medical/Surgery History Past Medical History:  Diagnosis Date   Heart failure with reduced ejection fraction ([place]) 2017   [facility]   Hypertension 2017   [facility]   Past Surgical History:  Procedure Laterality Date   RIGHT/LEFT HEART CATH AND CORONARY ANGIOGRAPHY N/A 02/16/2022   Procedure: RIGHT/LEFT HEART CATH AND CORONARY ANGIOGRAPHY;  Surgeon: Sherren Mocha, MD;  Location: [place] [facility];  Service: Cardiovascular;  Laterality: N/A;      A IV Location/Drains/Wounds Patient Lines/Drains/Airways Status     Active Line/Drains/Airways     Name Placement date Placement time Site Days   Peripheral IV 06/02/22 18 G Left Antecubital 06/02/22  --  Antecubital  less than 1   Peripheral IV 06/02/22 18 G Left Hand 06/02/22  0459  Hand  less than 1            Intake/Output Last 24 hours  Intake/Output Summary (Last 24 hours) at 06/02/2022 0530 Last data filed at 06/02/2022 0500 Gross per 24 hour  Intake --  Output 1600 ml  Net -1600 ml    Labs/Imaging Results for orders placed or performed during the hospital encounter of 06/02/22 (from the past 48 hour(s))  Comprehensive metabolic panel     Status: Abnormal   Collection Time: 06/02/22  3:22 AM  Result Value Ref Range   Sodium 137 135 - 145 mmol/L   Potassium 4.0 3.5 - 5.1 mmol/L    Comment: HEMOLYSIS AT THIS LEVEL MAY AFFECT RESULT   Chloride 100 98 - 111 mmol/L   CO2 26 22 - 32 mmol/L   Glucose, Bld 202 (H) 70 - 99 mg/dL    Comment: Glucose reference range applies only to samples taken after fasting for at least 8 hours.   BUN 22 (H) 6 - 20 mg/dL   Creatinine, Ser 1.49 (H) 0.61 - 1.24 mg/dL   Calcium 9.6 8.9 - 10.3 mg/dL   Total Protein 6.3 (L) 6.5 - 8.1 g/dL   Albumin 3.1 (L) 3.5 - 5.0 g/dL   AST 31 15 - 41 U/L    Comment: HEMOLYSIS AT THIS LEVEL MAY AFFECT RESULT   ALT  17 0 - 44 U/L    Comment: HEMOLYSIS AT THIS LEVEL MAY AFFECT RESULT   Alkaline Phosphatase 61 38 - 126 U/L   Total Bilirubin 0.9 0.3 - 1.2 mg/dL    Comment: HEMOLYSIS AT THIS LEVEL MAY AFFECT RESULT   GFR, Estimated 60 (L) >60 mL/min    Comment: (NOTE) Calculated using the CKD-EPI Creatinine Equation (2021)    Anion gap 11 5 - 15    Comment: Performed at [facility] [address], [place], [place] [address]  Brain natriuretic peptide     Status: Abnormal   Collection Time: 06/02/22  3:22 AM  Result Value Ref Range   B Natriuretic Peptide 551.9 (H) 0.0 - 100.0 pg/mL     Comment: Performed at [facility] [address], [place], [place] [address]  Troponin I (High Sensitivity)     Status: Abnormal   Collection Time: 06/02/22  3:22 AM  Result Value Ref Range   Troponin I (High Sensitivity) 143 (HH) <18 ng/L    Comment: CRITICAL RESULT CALLED TO, READ BACK BY AND VERIFIED WITH R. Victorino December, RN, [phone] 06/02/22, A. RAMSEY (NOTE) Elevated high sensitivity troponin I (hsTnI) values and significant  changes across serial measurements may suggest ACS but many other  chronic and acute conditions are known to elevate hsTnI results.  Refer to the "Links" section for chest pain algorithms and additional  guidance. Performed at [facility], [place] [address] [place], [place] [address]   CBC with Differential     Status: Abnormal   Collection Time: 06/02/22  3:22 AM  Result Value Ref Range   WBC 11.4 (H) 4.0 - 10.5 K/uL   RBC 5.38 4.22 - 5.81 MIL/uL   Hemoglobin 16.3 13.0 - 17.0 g/dL   HCT 48.4 39.0 - 52.0 %   MCV 90.0 80.0 - 100.0 fL   MCH 30.3 26.0 - 34.0 pg   MCHC 33.7 30.0 - 36.0 g/dL   RDW 14.6 11.5 - 15.5 %   Platelets 259 150 - 400 K/uL   nRBC 0.0 0.0 - 0.2 %   Neutrophils Relative % 80 %   Neutro Abs 9.0 (H) 1.7 - 7.7 K/uL   Lymphocytes Relative 11 %   Lymphs Abs 1.3 0.7 - 4.0 K/uL   Monocytes Relative 7 %   Monocytes Absolute 0.8 0.1 - 1.0 K/uL   Eosinophils Relative 1 %   Eosinophils Absolute 0.2 0.0 - 0.5 K/uL   Basophils Relative 0 %   Basophils Absolute 0.1 0.0 - 0.1 K/uL   Immature Granulocytes 1 %   Abs Immature Granulocytes 0.06 0.00 - 0.07 K/uL    Comment: Performed at [facility] [address] [place], [place] [address]   DG Chest Port 1 View  Result Date: 06/02/2022 CLINICAL DATA:  Shortness of breath EXAM: PORTABLE CHEST 1 VIEW COMPARISON:  02/16/2022 FINDINGS: Diffuse airspace opacity of the chest. There is recent diagnosis of pneumonia and some consideration in the chart of sarcoid. Cardiomegaly and vascular  pedicle widening. No visible effusion or pneumothorax. Artifact from EKG leads. IMPRESSION: 1. Bilateral airspace disease which is largely chronic based on prior studies. 2. Chronic cardiomegaly and vascular pedicle widening. 3. The above could easily obscure acute pneumonia or failure. Electronically Signed   By: Jorje Guild M.D.   On: 06/02/2022 04:16    Pending Labs Unresulted Labs (From admission, onward)    None       Vitals/Pain Today's Vitals  06/02/22 0421 06/02/22 0430 06/02/22 0445 06/02/22 0515  BP:  123/72 (!) 138/92 (!) 127/95  Pulse:  94 94 87  Resp:  (!) 24 (!) 25 (!) 22  Temp:      TempSrc:      SpO2:  97% 98% 99%  PainSc: Asleep       Isolation Precautions:  None   Medications Medications  aspirin chewable tablet 324 mg (has no administration in time range)  furosemide (LASIX) injection 40 mg (40 mg Intravenous Given 06/02/22 0348)  nitroGLYCERIN (NITROGLYN) 2 % ointment 1 inch (1 inch Topical Given 06/02/22 0504)    Mobility: Ambulatory     R Recommendations: See Admitting Provider Note

## 2022-06-02 NOTE — H&P (Signed)
History and Physical    Patient: Justin [name] YWV:[id number] DOB: May 03, 1981 DOA: 06/02/2022 DOS: the patient was seen and examined on 06/02/2022 PCP: Patient, No Pcp Per  Patient coming from: Home; NOK: Lilyan Gilford, [phone] - incorrect telephone number   Chief Complaint: SOB  HPI: Justin [name] is a 41 y.o. male with medical history significant of chronic systolic heart failure and HTN presenting with SOB.  He was last admitted from 6/1-13 with acute respiratory failure from decompensated heart failure.  Echo showed EF 35% and cardiac MRI was suspicious for sarcoidosis.  He underwent an unremarkable cath.  He appears to have been lost to f/u since.   He is quite somnolent this AM (RN reports that he has been up all night, already diuresed almost 2.5L).  He would answer questions briefly with his eyes closed but did not engage at all with me or the Spanish interpreter.  He denies CP and appears to acknowledge no f/u since his last hospitalization.  Per med rec, he has not been taking any of his medications prescribed at the time of last hospitalization.    ER Course:  Carryover, per Dr. Nevada Crane:  41 year old male with history of chronic hypoxia on 4 L nasal cannula, CHF, who presents with complaints of shortness of breath and peripheral edema.     Found to be in pulmonary edema requiring NRB in the setting of acute on chronic HFrEF 30 to 35% (June 2023).  Started on IV diuretics in the ED.  High-sensitivity troponin elevated and up trending, 143>>165.  Consider cardiology involvement.    Review of Systems: unable to review all systems due to the inability of the patient to answer questions. Past Medical History:  Diagnosis Date   Heart failure with reduced ejection fraction ([place]) 2017   [facility]   Hypertension 2017   [facility]   Past Surgical History:  Procedure Laterality Date   RIGHT/LEFT HEART CATH AND CORONARY ANGIOGRAPHY N/A  02/16/2022   Procedure: RIGHT/LEFT HEART CATH AND CORONARY ANGIOGRAPHY;  Surgeon: Sherren Mocha, MD;  Location: [facility];  Service: Cardiovascular;  Laterality: N/A;   Social History:  reports that he has never smoked. He has never used smokeless tobacco. He reports current alcohol use of about 30.0 standard drinks of alcohol per week. He reports current drug use. Drugs: "Crack" cocaine and Cocaine.  No Known Allergies  Family History  Problem Relation Age of Onset   Hypertension Father     Prior to Admission medications   Medication Sig Start Date End Date Taking? Authorizing Provider  Accu-Chek Softclix Lancets lancets use to check blood sugar up to four times daily 02/21/22   Thurnell Lose, MD  acetaminophen (TYLENOL) 500 MG tablet Take 1,000 mg by mouth every 6 (six) hours as needed for mild pain.    [provider]  blood glucose meter kit and supplies KIT Dispense based on patient and insurance preference. Use up to four times daily as directed. (FOR ICD-9 250.00, 250.01). For QAC - HS accuchecks. 02/21/22   Thurnell Lose, MD  blood glucose meter kit and supplies use to check blood sugars 4 times daily 02/21/22   Thurnell Lose, MD  carvedilol (COREG) 25 MG tablet Take 1 tablet (25 mg total) by mouth 2 (two) times daily with a meal. 02/21/22   Thurnell Lose, MD  dapagliflozin propanediol (FARXIGA) 10 MG TABS tablet Take 1 tablet (10 mg total) by mouth daily.  02/22/22   Thurnell Lose, MD  fenofibrate 54 MG tablet Take 1 tablet (54 mg total) by mouth daily. 02/21/22   Thurnell Lose, MD  glimepiride (AMARYL) 2 MG tablet Take 2 tablets (4 mg total) by mouth daily with breakfast. 02/22/22   Thurnell Lose, MD  glucose blood (ACCU-CHEK GUIDE) test strip Use to test 4 (four) times daily -  before meals and at bedtime. 02/21/22   Thurnell Lose, MD  metFORMIN (GLUCOPHAGE) 500 MG tablet Take 1 tablet (500 mg total) by mouth 2 (two) times daily with a meal.  02/21/22 03/23/22  Thurnell Lose, MD  sacubitril-valsartan (ENTRESTO) 24-26 MG Take 1 tablet by mouth 2 (two) times daily. 02/21/22   Thurnell Lose, MD  spironolactone (ALDACTONE) 25 MG tablet Take 1/2 tablet (12.5 mg total) by mouth daily. 02/22/22   Thurnell Lose, MD    Physical Exam: Vitals:   06/02/22 1517 06/02/22 0809 06/02/22 0845 06/02/22 0856  BP:   120/81   Pulse: 81  77   Resp: 19  18   Temp:    97.6 F (36.4 C)  TempSrc:    Oral  SpO2: 100%  96%   Weight:  98.9 kg    Height:  5' 5"  (1.651 m)     General:  Appears calm and comfortable and is in NAD, on 12L HFNC O2 Eyes:  normal lids, eyes closed throughout ENT:  grossly normal hearing, lips & tongue, mmm Neck:  no LAD, masses or thyromegaly Cardiovascular:  RRR, no m/r/g. No LE edema.  Respiratory:   CTA bilaterally with no wheezes/rales/rhonchi.  Normal to mildly increased respiratory effort. Abdomen:  soft, NT, ND Skin:  no rash or induration seen on limited exam Musculoskeletal:  grossly normal tone BUE/BLE, good ROM, no bony abnormality Psychiatric:  somnolent mood and affect, speech sparse but appropriate Neurologic:  CN 2-12 grossly intact - limited by somnolence   Radiological Exams on Admission: Independently reviewed - see discussion in A/P where applicable  DG Chest Port 1 View  Result Date: 06/02/2022 CLINICAL DATA:  Shortness of breath EXAM: PORTABLE CHEST 1 VIEW COMPARISON:  02/16/2022 FINDINGS: Diffuse airspace opacity of the chest. There is recent diagnosis of pneumonia and some consideration in the chart of sarcoid. Cardiomegaly and vascular pedicle widening. No visible effusion or pneumothorax. Artifact from EKG leads. IMPRESSION: 1. Bilateral airspace disease which is largely chronic based on prior studies. 2. Chronic cardiomegaly and vascular pedicle widening. 3. The above could easily obscure acute pneumonia or failure. Electronically Signed   By: Jorje Guild M.D.   On: 06/02/2022 04:16     EKG: Independently reviewed.  Sinus tachcyardia with rate 112; anterolateral ST elevation that is different from prior, concerning for ischemia   Labs on Admission: I have personally reviewed the available labs and imaging studies at the time of the admission.  Pertinent labs:    Glucose 202 BUN 22/Creatinine 1.49/GFR 60 - stable Albumin 3.1 BNP 551.9; 193.8 on 6/9 HS troponin 143, 165, 146 WBC 11.4   Assessment and Plan: Principal Problem:   Acute on chronic systolic CHF (congestive heart failure) (HCC) Active Problems:   Primary hypertension   Uncontrolled type 2 diabetes mellitus with hyperglycemia, without long-term current use of insulin (HCC)   Polysubstance abuse (HCC)    Acute on chronic systolic CHF -Patient with known h/o chronic systolic CHF presenting with worsening SOB and hypoxia -There was concern for cardiac sarcoidosis during prior hospitalization and he  appears to have been lost to f/u -CXR consistent with chronic airspace disease, likely also pulmonary edema -Elevated BNP compared to prior -With elevated BNP and abnl CXR, acute decompensated CHF seems probable as diagnosis -Will admit to progressive care, as per the Emergency HF Mortality Risk Grade.  The patient has: markedly elevated new O2 requirement (currently on 12L HFNC) -Will request repeat echocardiogram -Will start daily ASA -Will restart Entresto, spironolactone -Will hold Carvedilol in the setting of decompensated heart failure -Will hold Farxiga but will need to restart at the time of dc -CHF order set utilized -Cardiology consulted -Was given Lasix 40 mg x 1 in ER and will repeat with 40 mg IV BID -Continue [place] O2 for now - wean as tolerated -Stable kidney function at this time, will follow -EKG with possible ischemia, was reviewed with cardiology and no current concern for STEMI -Elevated HS troponin is likely related to demand ischemia; he does not c/o chest pain -Will check lipid  panel  HTN -Resume carvedilol when appropriate -Will also add prn hydralazine  DM -Last A1c was 9.9 on 6/2; it would not be expected to be improved since he is not taking medications -He has not been taking glimepride or metformin as prescribed -Will cover with SSI for now -DM coordinator consulted  Polysubstance abuse -Patient was too somnolent to answer questions at the time of my evaluation -However, his prior SH indicates that he drinks 30-60 beers per week and also uses cocaine/crack -Will check ETOH level (that might explain his somnolence) and UDS -Will order CIWA protocol      Advance Care Planning:   Code Status: Full Code   Consults: Cardiology; CHF navigator; DM coordinator; PT/OT; nutrition; TOC team  DVT Prophylaxis: Lovenox  Family Communication: None present and I was unable to reach family by telephone at the time of admission (wrong number)  Severity of Illness: The appropriate patient status for this patient is INPATIENT. Inpatient status is judged to be reasonable and necessary in order to provide the required intensity of service to ensure the patient's safety. The patient's presenting symptoms, physical exam findings, and initial radiographic and laboratory data in the context of their chronic comorbidities is felt to place them at high risk for further clinical deterioration. Furthermore, it is not anticipated that the patient will be medically stable for discharge from the hospital within 2 midnights of admission.   * I certify that at the point of admission it is my clinical judgment that the patient will require inpatient hospital care spanning beyond 2 midnights from the point of admission due to high intensity of service, high risk for further deterioration and high frequency of surveillance required.*  Author: Karmen Bongo, MD 06/02/2022 9:34 AM  For on call review [email or web address].

## 2022-06-02 NOTE — Evaluation (Signed)
Occupational Therapy Evaluation Patient Details Name: Justin [name] MRN: [id number] DOB: November 14, 1980 Today's Date: 06/02/2022   History of Present Illness Patient is a 41 yo male presenting to the ED on 06/02/22 with SOB and cough. Patient admitted with CHGF exacerbation and elevated tropinin. PMH includes: hypertension, hyperlipidemia, nonischemic cardiomyopathy, recent ECHO with 35% EF, DM II, mitral regurgitation, ETOH abuse, hx "crack"/cocaine use.   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 Currently, patient is at his baseline for transfers and ADLs, with his main limitation requiring 4L of oxygen to maintain saturations above 90%. OT not recommending further OT at discharge, but will provide education with regard to energy conservation and CHF management next session and then defer to mobility specialist.      Recommendations for follow up therapy are one component of a multi-disciplinary discharge planning process, led by the attending physician.  Recommendations may be updated based on patient status, additional functional criteria and insurance authorization.   Follow Up Recommendations  No OT follow up    Assistance Recommended at Discharge PRN  Patient can return home with the following Assist for transportation (initially)    Functional Status Assessment  Patient has had a recent decline in their functional status and demonstrates the ability to make significant improvements in function in a reasonable and predictable amount of time.  Equipment Recommendations  None recommended by OT    Recommendations for Other Services       Precautions / Restrictions Precautions Precautions: Fall Precaution Comments: watch O2 Restrictions Weight Bearing Restrictions: No      Mobility Bed Mobility Overal bed mobility: Modified Independent             General bed  mobility comments: able to complete with minimal extra time    Transfers Overall transfer level: Independent                 General transfer comment: able to complete sit<>stand from bed, and handed off to PT to assess mobility and balance      Balance Overall balance assessment: Modified Independent                                         ADL either performed or assessed with clinical judgement   ADL Overall ADL's : At baseline                                       General ADL Comments: Main limitation is requiring 4L O2 for ambulation     Vision Baseline Vision/History: 0 No visual deficits Ability to See in Adequate Light: 0 Adequate Patient Visual Report: No change from baseline       Perception     Praxis      Pertinent Vitals/Pain       Hand Dominance     Extremity/Trunk Assessment Upper Extremity Assessment Upper Extremity Assessment: Overall WFL for tasks assessed   Lower Extremity Assessment Lower Extremity Assessment: Defer to PT evaluation   Cervical / Trunk Assessment Cervical / Trunk Assessment: Normal   Communication Communication Communication: Interpreter utilized;Prefers language other than English   Cognition  General Comments  Requiring 4L O2 for mobility    Exercises     Shoulder Instructions      Home Living Family/patient expects to be discharged to:: Private residence Living Arrangements: Non-relatives/Friends Available Help at Discharge: Friend(s) Type of Home: House Home Access: Stairs to enter Technical brewer of Steps: 1 Entrance Stairs-Rails: None Home Layout: One level     Bathroom Shower/Tub: Teacher, early years/pre: Standard     Home Equipment: None          Prior Functioning/Environment Prior Level of Function : Independent/Modified Independent;Driving             Mobility  Comments: independent ADLs Comments: independent, working as a Curator and driving        OT Problem List: Decreased activity tolerance;Cardiopulmonary status limiting activity;Decreased knowledge of precautions      OT Treatment/Interventions: Self-care/ADL training;Therapeutic exercise;Energy conservation;DME and/or AE instruction;Manual therapy;Therapeutic activities;Patient/family education;Balance training    OT Goals(Current goals can be found in the care plan section) Acute Rehab OT Goals Patient Stated Goal: to feel better OT Goal Formulation: With patient Time For Goal Achievement: 06/16/22 Potential to Achieve Goals: Good  OT Frequency: Min 2X/week    Co-evaluation              AM-PAC OT "6 Clicks" Daily Activity     Outcome Measure Help from another person eating meals?: None Help from another person taking care of personal grooming?: None Help from another person toileting, which includes using toliet, bedpan, or urinal?: None Help from another person bathing (including washing, rinsing, drying)?: None Help from another person to put on and taking off regular upper body clothing?: None Help from another person to put on and taking off regular lower body clothing?: None 6 Click Score: 24   End of Session Equipment Utilized During Treatment: Oxygen Nurse Communication: Mobility status  Activity Tolerance: Patient tolerated treatment well Patient left: Other (comment) (handed off to PT for mobility)  OT Visit Diagnosis: Unsteadiness on feet (R26.81)                Time: 3491-7915 OT Time Calculation (min): 13 min Charges:  OT General Charges $OT Visit: 1 Visit OT Evaluation $OT Eval Moderate Complexity: 1 Mod  Justin [name], OTR/L Acute Rehabilitation Services [phone]   Justin [name] 06/02/2022, 3:23 PM

## 2022-06-02 NOTE — Evaluation (Signed)
Physical Therapy Evaluation & Discharge  Patient Details Name: Justin [name] MRN: [id number] DOB: 02-21-1981 Today's Date: 06/02/2022  History of Present Illness  Patient is a 41 yo male presenting to the ED on 06/02/22 with SOB and cough. Patient admitted with CHGF exacerbation and elevated tropinin. PMH includes: hypertension, hyperlipidemia, nonischemic cardiomyopathy, recent ECHO with 35% EF, DM II, mitral regurgitation, ETOH abuse, hx "crack"/cocaine use.  Clinical Impression  Patient admitted with the above. PTA, patient lives with roommate/friend and was independent requiring no O2. Patient currently requiring 4L O2 [place] to maintain spO2 >89% during mobility. Patient functioning at modI level for mobility with no AD. Encouraged continued mobility to build endurance to wean off O2. No further skilled PT needs identified acutely. No PT follow up recommended at this time. Will defer further mobility to mobility specialists and nursing staff.        Recommendations for follow up therapy are one component of a multi-disciplinary discharge planning process, led by the attending physician.  Recommendations may be updated based on patient status, additional functional criteria and insurance authorization.  Follow Up Recommendations No PT follow up      Assistance Recommended at Discharge PRN  Patient can return home with the following       Equipment Recommendations None recommended by PT  Recommendations for Other Services       Functional Status Assessment Patient has had a recent decline in their functional status and demonstrates the ability to make significant improvements in function in a reasonable and predictable amount of time.     Precautions / Restrictions Precautions Precautions: Fall Precaution Comments: watch O2 Restrictions Weight Bearing Restrictions: No      Mobility  Bed Mobility Overal bed mobility: Modified Independent                   Transfers Overall transfer level: Independent Equipment used: None                    Ambulation/Gait Ambulation/Gait assistance: Modified independent (Device/Increase time) Gait Distance (Feet): 300 Feet Assistive device: None Gait Pattern/deviations: WFL(Within Functional Limits) Gait velocity: normal Gait velocity interpretation: >4.37 ft/sec, indicative of normal walking speed      Stairs            Wheelchair Mobility    Modified Rankin (Stroke Patients Only)       Balance Overall balance assessment: Modified Independent                                           Pertinent Vitals/Pain Pain Assessment Pain Assessment: No/denies pain    Home Living Family/patient expects to be discharged to:: Private residence Living Arrangements: Non-relatives/Friends Available Help at Discharge: Friend(s) Type of Home: House Home Access: Stairs to enter Entrance Stairs-Rails: None Technical brewer of Steps: 1   Home Layout: One level Home Equipment: None      Prior Function Prior Level of Function : Independent/Modified Independent;Driving             Mobility Comments: independent ADLs Comments: independent, working as a Optician, dispensing        Extremity/Trunk Assessment   Upper Extremity Assessment Upper Extremity Assessment: Defer to OT evaluation    Lower Extremity Assessment Lower Extremity Assessment: Overall WFL for tasks assessed    Cervical / Trunk Assessment  Cervical / Trunk Assessment: Normal  Communication   Communication: Interpreter utilized;Prefers language other than English  Cognition Arousal/Alertness: Awake/alert Behavior During Therapy: WFL for tasks assessed/performed Overall Cognitive Status: Within Functional Limits for tasks assessed                                          General Comments General comments (skin integrity, edema, etc.): Requires  4L O2 for mobility with drop to 89% during ambulation    Exercises     Assessment/Plan    PT Assessment Patient does not need any further PT services  PT Problem List         PT Treatment Interventions      PT Goals (Current goals can be found in the Care Plan section)  Acute Rehab PT Goals Patient Stated Goal: did not state PT Goal Formulation: All assessment and education complete, DC therapy    Frequency       Co-evaluation               AM-PAC PT "6 Clicks" Mobility  Outcome Measure Help needed turning from your back to your side while in a flat bed without using bedrails?: None Help needed moving from lying on your back to sitting on the side of a flat bed without using bedrails?: None Help needed moving to and from a bed to a chair (including a wheelchair)?: None Help needed standing up from a chair using your arms (e.g., wheelchair or bedside chair)?: None Help needed to walk in hospital room?: None Help needed climbing 3-5 steps with a railing? : None 6 Click Score: 24    End of Session Equipment Utilized During Treatment: Oxygen Activity Tolerance: Patient tolerated treatment well Patient left: Other (comment) (with transport in w/c) Nurse Communication: Mobility status PT Visit Diagnosis: Muscle weakness (generalized) (M62.81)    Time: 2951-8841 PT Time Calculation (min) (ACUTE ONLY): 13 min   Charges:   PT Evaluation $PT Eval Low Complexity: 1 Low          Mccartney Chuba A. Dan Humphreys PT, DPT Acute Rehabilitation Services Office [phone]   [name] 06/02/2022, 5:00 PM

## 2022-06-02 NOTE — ED Triage Notes (Signed)
Patient [place] local [place] reports SOB this evening with occasional dry cough , history of heart failure with reduced ejection fraction and cardiomyopathy.

## 2022-06-02 NOTE — Inpatient Diabetes Management (Addendum)
Inpatient Diabetes Program Recommendations  AACE/ADA: New Consensus Statement on Inpatient Glycemic Control (2015)  Target Ranges:  Prepandial:   less than 140 mg/dL      Peak postprandial:   less than 180 mg/dL (1-2 hours)      Critically ill patients:  140 - 180 mg/dL    Latest Reference Range & Units 06/02/22 03:22  Glucose 70 - 99 mg/dL 202 (H)  (H): Data is abnormally high   Admit with: Acute on chronic systolic CHF  History: DM2, CHF, Polysubstance abuse  Home DM Meds: Farxiga 10 mg daily       Amaryl 4 mg daily       Metformin 500 mg BID       (NOT taking any of the above meds per Home Med Rec)  Current Orders: Novolog Moderate Correction Scale/ SSI (0-15 units) TID AC + HS    Note orders placed for Novolog Correction scale--Will start at 12pm today  UDS pending  NOT taking any of his Diabetes meds per [place] started on Metformin, Farxiga, and Metformin during last admission (June 2023) when he was newly diagnosed with diabetes--Was counseled by the Diabetes Coordinator 2 times during last admission  Agree with current orders   --Will follow patient during hospitalization--  Wyn Quaker RN, MSN, [place] Diabetes Coordinator Inpatient Glycemic Control Team Team Pager: [phone] (8a-5p)

## 2022-06-02 NOTE — Progress Notes (Signed)
Heart Failure Navigator Progress Note  Assessed for Heart & Vascular TOC clinic readiness.  Patient does not meet criteria due to Advanced Heart Failure Team ( Dr. Scharlene Gloss).    Justin [name], BSN, Clinical cytogeneticist Only

## 2022-06-02 NOTE — ED Notes (Signed)
ED TO INPATIENT HANDOFF REPORT  ED Nurse Name and Phone #: [phone]  S Name/Age/Gender Justin [name] 41 y.o. male Room/Bed: RESUSC/RESUSC  Code Status   Code Status: Full Code  Home/SNF/Other Home Patient oriented to: self, place, time, and situation Is this baseline? Yes   Triage Complete: Triage complete  Chief Complaint Acute CHF (congestive heart failure) ([place]) [I50.9]  Triage Note Patient [place] with EMS [place] a local restaurant reports SOB this evening with occasional dry cough , history of heart failure with reduced ejection fraction and cardiomyopathy.    Allergies No Known Allergies  Level of Care/Admitting Diagnosis ED Disposition     ED Disposition  Admit   Condition  --   Comment  Hospital Area: [facility] [[id number]]  Level of Care: Progressive [102]  Admit to Progressive based on following criteria: CARDIOVASCULAR & THORACIC of moderate stability with acute coronary syndrome symptoms/low risk myocardial infarction/hypertensive urgency/arrhythmias/heart failure potentially compromising stability and stable post cardiovascular intervention patients.  May admit patient to [facility] or [facility] if equivalent level of care is available:: No  Covid Evaluation: Asymptomatic - no recent exposure (last 10 days) testing not required  Diagnosis: Acute CHF (congestive heart failure) [facility]) [[id number]]  Admitting Physician: Kayleen Memos [[id number]]  Attending Physician: Kayleen Memos [[id number]]  Certification:: I certify this patient will need inpatient services for at least 2 midnights  Estimated Length of Stay: 2          B Medical/Surgery History Past Medical History:  Diagnosis Date   Heart failure with reduced ejection fraction ([place]) 2017   [facility]   Hypertension 2017   [facility]   Past Surgical History:  Procedure Laterality Date   RIGHT/LEFT HEART CATH AND CORONARY ANGIOGRAPHY N/A 02/16/2022    Procedure: RIGHT/LEFT HEART CATH AND CORONARY ANGIOGRAPHY;  Surgeon: Sherren Mocha, MD;  Location: [facility];  Service: Cardiovascular;  Laterality: N/A;     A IV Location/Drains/Wounds Patient Lines/Drains/Airways Status     Active Line/Drains/Airways     Name Placement date Placement time Site Days   Peripheral IV 06/02/22 18 G Left Antecubital 06/02/22  --  Antecubital  less than 1   Peripheral IV 06/02/22 18 G Left Hand 06/02/22  0459  Hand  less than 1            Intake/Output Last 24 hours  Intake/Output Summary (Last 24 hours) at 06/02/2022 1104 Last data filed at 06/02/2022 1700 Gross per 24 hour  Intake --  Output 2300 ml  Net -2300 ml    Labs/Imaging Results for orders placed or performed during the hospital encounter of 06/02/22 (from the past 48 hour(s))  Comprehensive metabolic panel     Status: Abnormal   Collection Time: 06/02/22  3:22 AM  Result Value Ref Range   Sodium 137 135 - 145 mmol/L   Potassium 4.0 3.5 - 5.1 mmol/L    Comment: HEMOLYSIS AT THIS LEVEL MAY AFFECT RESULT   Chloride 100 98 - 111 mmol/L   CO2 26 22 - 32 mmol/L   Glucose, Bld 202 (H) 70 - 99 mg/dL    Comment: Glucose reference range applies only to samples taken after fasting for at least 8 hours.   BUN 22 (H) 6 - 20 mg/dL   Creatinine, Ser 1.49 (H) 0.61 - 1.24 mg/dL   Calcium 9.6 8.9 - 10.3 mg/dL   Total Protein 6.3 (L) 6.5 - 8.1 g/dL   Albumin 3.1 (  L) 3.5 - 5.0 g/dL   AST 31 15 - 41 U/L    Comment: HEMOLYSIS AT THIS LEVEL MAY AFFECT RESULT   ALT 17 0 - 44 U/L    Comment: HEMOLYSIS AT THIS LEVEL MAY AFFECT RESULT   Alkaline Phosphatase 61 38 - 126 U/L   Total Bilirubin 0.9 0.3 - 1.2 mg/dL    Comment: HEMOLYSIS AT THIS LEVEL MAY AFFECT RESULT   GFR, Estimated 60 (L) >60 mL/min    Comment: (NOTE) Calculated using the CKD-EPI Creatinine Equation (2021)    Anion gap 11 5 - 15    Comment: Performed at [facility] [address] [place], [place] [address]   Brain natriuretic peptide     Status: Abnormal   Collection Time: 06/02/22  3:22 AM  Result Value Ref Range   B Natriuretic Peptide 551.9 (H) 0.0 - 100.0 pg/mL    Comment: Performed at [facility] [address], [place], [place] [address]  Troponin I (High Sensitivity)     Status: Abnormal   Collection Time: 06/02/22  3:22 AM  Result Value Ref Range   Troponin I (High Sensitivity) 143 (HH) <18 ng/L    Comment: CRITICAL RESULT CALLED TO, READ BACK BY AND VERIFIED WITH R. Victorino December, RN, [phone] 06/02/22, A. RAMSEY (NOTE) Elevated high sensitivity troponin I (hsTnI) values and significant  changes across serial measurements may suggest ACS but many other  chronic and acute conditions are known to elevate hsTnI results.  Refer to the "Links" section for chest pain algorithms and additional  guidance. Performed at [facility], [place] [address] [place], [place] [address]   CBC with Differential     Status: Abnormal   Collection Time: 06/02/22  3:22 AM  Result Value Ref Range   WBC 11.4 (H) 4.0 - 10.5 K/uL   RBC 5.38 4.22 - 5.81 MIL/uL   Hemoglobin 16.3 13.0 - 17.0 g/dL   HCT 48.4 39.0 - 52.0 %   MCV 90.0 80.0 - 100.0 fL   MCH 30.3 26.0 - 34.0 pg   MCHC 33.7 30.0 - 36.0 g/dL   RDW 14.6 11.5 - 15.5 %   Platelets 259 150 - 400 K/uL   nRBC 0.0 0.0 - 0.2 %   Neutrophils Relative % 80 %   Neutro Abs 9.0 (H) 1.7 - 7.7 K/uL   Lymphocytes Relative 11 %   Lymphs Abs 1.3 0.7 - 4.0 K/uL   Monocytes Relative 7 %   Monocytes Absolute 0.8 0.1 - 1.0 K/uL   Eosinophils Relative 1 %   Eosinophils Absolute 0.2 0.0 - 0.5 K/uL   Basophils Relative 0 %   Basophils Absolute 0.1 0.0 - 0.1 K/uL   Immature Granulocytes 1 %   Abs Immature Granulocytes 0.06 0.00 - 0.07 K/uL    Comment: Performed at [facility] [address], [place], [place] [address]  Troponin I (High Sensitivity)     Status: Abnormal   Collection Time: 06/02/22  5:20 AM  Result Value Ref Range   Troponin I (High  Sensitivity) 165 (HH) <18 ng/L    Comment: CRITICAL VALUE NOTED.  VALUE IS CONSISTENT WITH PREVIOUSLY REPORTED AND CALLED VALUE. (NOTE) Elevated high sensitivity troponin I (hsTnI) values and significant  changes across serial measurements may suggest ACS but many other  chronic and acute conditions are known to elevate hsTnI results.  Refer to the "Links" section for chest pain algorithms and additional  guidance. Performed at [facility], [place] [address], [place], [place] [address]   Troponin I (High Sensitivity)     Status: Abnormal   Collection Time: 06/02/22  6:51 AM  Result Value Ref Range   Troponin I (High Sensitivity) 146 (HH) <18 ng/L    Comment: CRITICAL VALUE NOTED. VALUE IS CONSISTENT WITH PREVIOUSLY REPORTED/CALLED VALUE (NOTE) Elevated high sensitivity troponin I (hsTnI) values and significant  changes across serial measurements may suggest ACS but many other  chronic and acute conditions are known to elevate hsTnI results.  Refer to the "Links" section for chest pain algorithms and additional  guidance. Performed at [facility], [place] [address], [place], [place] [address]   Troponin I (High Sensitivity)     Status: Abnormal   Collection Time: 06/02/22  8:52 AM  Result Value Ref Range   Troponin I (High Sensitivity) 105 (HH) <18 ng/L    Comment: CRITICAL VALUE NOTED. VALUE IS CONSISTENT WITH PREVIOUSLY REPORTED/CALLED VALUE (NOTE) Elevated high sensitivity troponin I (hsTnI) values and significant  changes across serial measurements may suggest ACS but many other  chronic and acute conditions are known to elevate hsTnI results.  Refer to the "Links" section for chest pain algorithms and additional  guidance. Performed at [facility], [place] [address], [place], [place] [address]   Ethanol     Status: None   Collection Time: 06/02/22  8:52 AM  Result Value Ref Range   Alcohol, Ethyl (B) <10 <10 mg/dL    Comment: (NOTE) Lowest detectable limit  for serum alcohol is 10 mg/dL.  For medical purposes only. Performed at [facility], [place] [address] [place], [place] [address]    DG Chest Port 1 View  Result Date: 06/02/2022 CLINICAL DATA:  Shortness of breath EXAM: PORTABLE CHEST 1 VIEW COMPARISON:  02/16/2022 FINDINGS: Diffuse airspace opacity of the chest. There is recent diagnosis of pneumonia and some consideration in the chart of sarcoid. Cardiomegaly and vascular pedicle widening. No visible effusion or pneumothorax. Artifact from EKG leads. IMPRESSION: 1. Bilateral airspace disease which is largely chronic based on prior studies. 2. Chronic cardiomegaly and vascular pedicle widening. 3. The above could easily obscure acute pneumonia or failure. Electronically Signed   By: Jorje Guild M.D.   On: 06/02/2022 04:16    Pending Labs Unresulted Labs (From admission, onward)     Start     Ordered   06/03/22 [id number]  Basic metabolic panel  Tomorrow morning,   R        06/02/22 0841   06/03/22 0500  CBC  Tomorrow morning,   R        06/02/22 0841   06/02/22 0932  Lipid panel  Once,   R        06/02/22 0936   06/02/22 0841  Rapid urine drug screen (hospital performed)  ONCE - STAT,   STAT        06/02/22 0841   06/02/22 0839  HIV Antibody (routine testing w rflx)  (HIV Antibody (Routine testing w reflex) panel)  Once,   R        06/02/22 0841            Vitals/Pain Today's Vitals   06/02/22 0900 06/02/22 0945 06/02/22 1000 06/02/22 1030  BP: 120/87 130/88 128/83 126/84  Pulse: 88 74 73 73  Resp: (!) 22 16 19 19   Temp:      TempSrc:      SpO2: 98% 95% 97% 95%  Weight:      Height:      PainSc:        Isolation Precautions No active isolations  Medications Medications  fenofibrate tablet 54 mg (54 mg Oral Given 06/02/22 1042)  sacubitril-valsartan (ENTRESTO) 24-26 mg per tablet (has no administration in time range)  spironolactone (ALDACTONE) tablet 12.5 mg (12.5 mg Oral Given 06/02/22 1042)  furosemide  (LASIX) injection 40 mg (has no administration in time range)  insulin aspart (novoLOG) injection 0-15 Units (has no administration in time range)  insulin aspart (novoLOG) injection 0-5 Units (has no administration in time range)  sodium chloride flush (NS) 0.9 % injection 3 mL (3 mLs Intravenous Given 06/02/22 1044)  acetaminophen (TYLENOL) tablet 650 mg (has no administration in time range)    Or  acetaminophen (TYLENOL) suppository 650 mg (has no administration in time range)  docusate sodium (COLACE) capsule 100 mg (100 mg Oral Given 06/02/22 1042)  polyethylene glycol (MIRALAX / GLYCOLAX) packet 17 g (has no administration in time range)  bisacodyl (DULCOLAX) EC tablet 5 mg (has no administration in time range)  ondansetron (ZOFRAN) tablet 4 mg (has no administration in time range)    Or  ondansetron (ZOFRAN) injection 4 mg (has no administration in time range)  hydrALAZINE (APRESOLINE) injection 5 mg (has no administration in time range)  enoxaparin (LOVENOX) injection 40 mg (has no administration in time range)  LORazepam (ATIVAN) tablet 1-4 mg (has no administration in time range)    Or  LORazepam (ATIVAN) injection 1-4 mg (has no administration in time range)  thiamine (VITAMIN B1) tablet 100 mg (100 mg Oral Given 06/02/22 1042)    Or  thiamine (VITAMIN B1) injection 100 mg ( Intravenous See Alternative [id number])  folic acid (FOLVITE) tablet 1 mg (1 mg Oral Given 06/02/22 1042)  multivitamin with minerals tablet 1 tablet (1 tablet Oral Given 06/02/22 1042)  aspirin EC tablet 81 mg (has no administration in time range)  furosemide (LASIX) injection 40 mg (40 mg Intravenous Given 06/02/22 0348)  nitroGLYCERIN (NITROGLYN) 2 % ointment 1 inch (1 inch Topical Given 06/02/22 0504)  aspirin chewable tablet 324 mg (324 mg Oral Given 06/02/22 0549)    Mobility walks Low fall risk   Focused Assessments Pulmonary Assessment Handoff:  Lung sounds: Bilateral Breath Sounds: Clear,  Diminished O2 Device: High Flow Nasal Cannula (salter) O2 Flow Rate (L/min): 12 L/min    R Recommendations: See Admitting Provider Note  Report given to:   Additional Notes: output 2325ml after the 40 lasix

## 2022-06-02 NOTE — ED Notes (Signed)
Called RT to titrate oxygen to maintain oxygen sats >92% so patient doesn't have to be on NRB

## 2022-06-02 NOTE — ED Notes (Signed)
Dr. Roxanne Mins EDP notified on abnormal blood tests results.

## 2022-06-02 NOTE — ED Provider Notes (Signed)
[facility] EMERGENCY DEPARTMENT Provider Note   CSN: [id number] Arrival date & time: 06/02/22  0325     History  Chief Complaint  Patient presents with   Shortness of Breath    Justin [name] is a 41 y.o. male.  The history is provided by the patient.  Shortness of Breath He has history of hypertension, hyperlipidemia, nonischemic cardiomyopathy and comes in with onset about 1 hour ago of shortness of breath.  He denies chest pain, heaviness, tightness, pressure.  There has been a slight cough which is nonproductive.  He denies fever or chills.   Home Medications Prior to Admission medications   Medication Sig Start Date End Date Taking? Authorizing Provider  Accu-Chek Softclix Lancets lancets use to check blood sugar up to four times daily 02/21/22   Thurnell Lose, MD  acetaminophen (TYLENOL) 500 MG tablet Take 1,000 mg by mouth every 6 (six) hours as needed for mild pain.    [provider]  blood glucose meter kit and supplies KIT Dispense based on patient and insurance preference. Use up to four times daily as directed. (FOR ICD-9 250.00, 250.01). For QAC - HS accuchecks. 02/21/22   Thurnell Lose, MD  blood glucose meter kit and supplies use to check blood sugars 4 times daily 02/21/22   Thurnell Lose, MD  carvedilol (COREG) 25 MG tablet Take 1 tablet (25 mg total) by mouth 2 (two) times daily with a meal. 02/21/22   Thurnell Lose, MD  dapagliflozin propanediol (FARXIGA) 10 MG TABS tablet Take 1 tablet (10 mg total) by mouth daily. 02/22/22   Thurnell Lose, MD  fenofibrate 54 MG tablet Take 1 tablet (54 mg total) by mouth daily. 02/21/22   Thurnell Lose, MD  glimepiride (AMARYL) 2 MG tablet Take 2 tablets (4 mg total) by mouth daily with breakfast. 02/22/22   Thurnell Lose, MD  glucose blood (ACCU-CHEK GUIDE) test strip Use to test 4 (four) times daily -  before meals and at bedtime. 02/21/22   Thurnell Lose, MD   metFORMIN (GLUCOPHAGE) 500 MG tablet Take 1 tablet (500 mg total) by mouth 2 (two) times daily with a meal. 02/21/22 03/23/22  Thurnell Lose, MD  sacubitril-valsartan (ENTRESTO) 24-26 MG Take 1 tablet by mouth 2 (two) times daily. 02/21/22   Thurnell Lose, MD  spironolactone (ALDACTONE) 25 MG tablet Take 1/2 tablet (12.5 mg total) by mouth daily. 02/22/22   Thurnell Lose, MD      Allergies    Patient has no known allergies.    Review of Systems   Review of Systems  Respiratory:  Positive for shortness of breath.   All other systems reviewed and are negative.   Physical Exam Updated Vital Signs BP (!) 175/111 (BP Location: Right Arm)   Pulse (!) 108   Temp 97.8 F (36.6 C) (Oral)   Resp (!) 22   SpO2 92%  Physical Exam Vitals and nursing note reviewed.   41 year old male, resting comfortably and in no acute distress. Vital signs are significant for elevated heart rate, respiratory rate, blood pressure. Oxygen saturation is 92%, which is normal. Head is normocephalic and atraumatic. PERRLA, EOMI. Oropharynx is clear. Neck is nontender and supple without adenopathy or JVD. Back is nontender and there is no CVA tenderness.  There is 2+ presacral edema. Lungs have rales two thirds of the way up.  There are no wheezes or rhonchi. Chest is nontender.  Heart has regular rate and rhythm without murmur. Abdomen is soft, flat, nontender. Extremities have 2+ edema, full range of motion is present. Skin is warm and dry without rash. Neurologic: Mental status is normal, cranial nerves are intact, moves all extremities equally.  ED Results / Procedures / Treatments   Labs (all labs ordered are listed, but only abnormal results are displayed) Labs Reviewed  COMPREHENSIVE METABOLIC PANEL - Abnormal; Notable for the following components:      Result Value   Glucose, Bld 202 (*)    BUN 22 (*)    Creatinine, Ser 1.49 (*)    Total Protein 6.3 (*)    Albumin 3.1 (*)    GFR,  Estimated 60 (*)    All other components within normal limits  BRAIN NATRIURETIC PEPTIDE - Abnormal; Notable for the following components:   B Natriuretic Peptide 551.9 (*)    All other components within normal limits  CBC WITH DIFFERENTIAL/PLATELET - Abnormal; Notable for the following components:   WBC 11.4 (*)    Neutro Abs 9.0 (*)    All other components within normal limits  TROPONIN I (HIGH SENSITIVITY) - Abnormal; Notable for the following components:   Troponin I (High Sensitivity) 143 (*)    All other components within normal limits  TROPONIN I (HIGH SENSITIVITY) - Abnormal; Notable for the following components:   Troponin I (High Sensitivity) 165 (*)    All other components within normal limits  TROPONIN I (HIGH SENSITIVITY)    EKG EKG Interpretation  Date/Time:  Friday June 02 2022 03:32:56 EDT Ventricular Rate:  112 PR Interval:  160 QRS Duration: 107 QT Interval:  339 QTC Calculation: 463 R Axis:   150 Text Interpretation: Sinus tachycardia Atrial premature complex Left atrial enlargement Consider left ventricular hypertrophy Lateral infarct, recent Anterior ST elevation, probably due to LVH Right axis deviation When compared with ECG of 02/11/2022, HEART RATE has increased QT has lengthened Confirmed by Delora Fuel ([id number]) on 06/02/2022 3:39:39 AM  Radiology DG Chest Port 1 View  Result Date: 06/02/2022 CLINICAL DATA:  Shortness of breath EXAM: PORTABLE CHEST 1 VIEW COMPARISON:  02/16/2022 FINDINGS: Diffuse airspace opacity of the chest. There is recent diagnosis of pneumonia and some consideration in the chart of sarcoid. Cardiomegaly and vascular pedicle widening. No visible effusion or pneumothorax. Artifact from EKG leads. IMPRESSION: 1. Bilateral airspace disease which is largely chronic based on prior studies. 2. Chronic cardiomegaly and vascular pedicle widening. 3. The above could easily obscure acute pneumonia or failure. Electronically Signed   By: Jorje Guild M.D.   On: 06/02/2022 04:16    Procedures Procedures  Cardiac monitor shows sinus tachycardia with PACs, per my interpretation.  Medications Ordered in ED Medications  furosemide (LASIX) injection 40 mg (40 mg Intravenous Given 06/02/22 0348)  nitroGLYCERIN (NITROGLYN) 2 % ointment 1 inch (1 inch Topical Given 06/02/22 0504)  aspirin chewable tablet 324 mg (324 mg Oral Given 06/02/22 0549)    ED Course/ Medical Decision Making/ A&P                           Medical Decision Making Amount and/or Complexity of Data Reviewed Labs: ordered. Radiology: ordered.  Risk OTC drugs. Prescription drug management. Decision regarding hospitalization.   Acute dyspnea and patient with history of heart failure.  Physical findings are strongly suggestive of heart failure.  However, differential does include pneumonia, pulmonary embolism.  Old records are reviewed, and he  had been admitted to the hospital on 02/09/2022 with shortness of breath.  Work-up showed reduced ejection fraction of 30-35%, cardiac catheterization showed essentially normal coronary arteries.  I have ordered chest x-ray and laboratory work-up which includes a CBC, comprehensive metabolic panel, troponin, BNP.  I have ordered a dose of furosemide intravenously.  Chest x-ray shows cardiomegaly and pulmonary vascular congestion, but not changed from prior.  I have independently viewed the image, and do feel that the findings are most consistent with heart failure.  I have reviewed and interpreted the laboratory tests, and my interpretation is stable renal insufficiency, hypoalbuminemia which is improved compared with baseline, elevated BNP consistent with heart failure, elevated troponin which is most likely demand ischemia given recent catheterization which showed essentially clean coronary arteries, mild leukocytosis which is nonspecific.  I have ordered a dose of aspirin but I am planning to hold off on heparin since the feel the  troponin is related to demand ischemia and not ACS.  I have ordered topical nitroglycerin.  Case is discussed with Dr. [place] Crane of [facility] hospital[facility], who agrees to admit the patient.  Repeat troponin has risen significantly, but I still feel this is related to demand ischemia and not ACS, I have not started heparin.  Final Clinical Impression(s) / ED Diagnoses Final diagnoses:  Acute on chronic systolic heart failure ([place])  Acute respiratory failure with hypoxia ([facility])  Elevated troponin  Renal insufficiency    Rx / DC Orders ED Discharge Orders     None         Delora Fuel, MD 00/41/59 [phone]

## 2022-06-02 NOTE — Progress Notes (Signed)
  Echocardiogram 2D Echocardiogram has been performed.  Justin [name] 06/02/2022, 3:36 PM

## 2022-06-02 NOTE — Progress Notes (Signed)
Pt arrived to unit from  ED VSS, A/O x 4, CCMD called ,CHG given, pt oriented to unit,Will continue to monitor. Pt on HFNC 8L  Phoebe Sharps, RN    06/02/22 1212  Vitals  Temp 97.8 F (36.6 C)  Temp Source Oral  BP 133/84  MAP (mmHg) 96  BP Location Right Arm  BP Method Automatic  Patient Position (if appropriate) Lying  Pulse Rate 80  Pulse Rate Source Monitor  ECG Heart Rate 80  Resp 18  Level of Consciousness  Level of Consciousness Alert  Oxygen Therapy  SpO2 96 %  O2 Device HFNC  Heater temperature 53.6 F (12 C)  O2 Flow Rate (L/min) 8 L/min  Pulse Oximetry Type Continuous  Pain Assessment  Pain Scale 0-10  Pain Score 0

## 2022-06-02 NOTE — Consult Note (Addendum)
Advanced Heart Failure Team Consult Note   Primary Physician: Patient, No Pcp Per PCP-Cardiologist:  Buford Dresser, MD  Reason for Consultation: Acute on chronic systolic CHF  HPI:    Justin [name] is seen today for evaluation of acute on chronic systolic CHF at the request of Dr. Lorin Mercy with [facility]. 41 y.o. Spanish-speaking male with history of chronic systolic CHF, hx uncontrolled HTN, DM II, mitral regurgitation, ETOH abuse, hx "crack"/cocaine use.  Heart failure dates back to 2017. Admitted to [facility] with acute systolic CHF and hypertensive urgency. Apparently echo showed severe LV dsyfunction and severe LVH. Cath with no CAD.  Last seen in Cardiology clinic in 2019. Echo 08/19: EF improved to 55%, severe LVH, RV okay  He was admitted in June 2023 with acute respiratory failure with hypoxia requiring BiPAP 2/2 a/c CHF. He was diuresed and initiated on GDMT. Echo 02/09/22: EF 30-35%, RV okay, mild to moderate MR.  [facility] 02/16/22: No significant CAD, RA mean 1, PA mean 19, PCWP 3, LVEDP 7, Fick CO/CI 4.17/1.99  cMRI 02/20/22: LVEF 29%, LV severely dilated, asymmetric LVH with basal septum measuring 17 mm, RVEF 45%, patchy LGE in setpum and RV insertion sites + subepicardial LGE in inferolateral wall. Scar pattern felt to be consistent with HCM. However, d/t subepicardial LGE in inferolateral wall and presence of mediastinal lymphadenopathy, cardiac PET recommended to r/o sarcoid, small pericardial effusion.   After discharge he no-showed TOC and cardiology clinic follow-ups.   History obtained with assistance from [place] interpreter. Reports worsening dyspnea X 1 week. No orthopnea, PND, CP or early satiety. Has been out of all of his medications. Presented to the ED early this am with worsening symptoms. BNP 551, HS troponin 143>165>146>105, Scr 1.49, CO2 26, Na 137, K 4.0. HIV not reactive. ETOH negative. Given 40 mg lasix IV and started on 40 mg IV BID.  Coreg held. Resumed Entresto 24/26 BID and 12.5 mg spiro daily.   Works as a Curator. Drinks a 24 pack of beer and uses cocaine on weekends.  No family history of heart failure.   Review of Systems: [y] = yes, _0  = no   General: Weight gain _1 ; Weight loss _2 ; Anorexia _3 ; Fatigue _4 ; Fever _5 ; Chills _6 ; Weakness _7   Cardiac: Chest pain/pressure _8 ; Resting SOB [Y]; Exertional SOB [Y]; Orthopnea _9 ; Pedal Edema _10 ; Palpitations _11 ; Syncope _12 ; Presyncope _13 ; Paroxysmal nocturnal dyspnea_14   Pulmonary: Cough [Y]; Wheezing_15 ; Hemoptysis_16 ; Sputum _17 ; Snoring _18   GI: Vomiting_19 ; Dysphagia_20 ; Melena_21 ; Hematochezia _22 ; Heartburn_23 ; Abdominal pain _24 ; Constipation _25 ; Diarrhea _26 ; BRBPR _27   GU: Hematuria_28 ; Dysuria _29 ; Nocturia_30   Vascular: Pain in legs with walking _31 ; Pain in feet with lying flat _32 ; Non-healing sores _33 ; Stroke _34 ; TIA _35 ; Slurred speech _36 ;  Neuro: Headaches_37 ; Vertigo_38 ; Seizures_39 ; Paresthesias_40 ;Blurred vision _41 ; Diplopia _42 ; Vision changes _43   Ortho/Skin: Arthritis _44 ; Joint pain _45 ; Muscle pain _46 ; Joint swelling _47 ; Back Pain _48 ; Rash _49   Psych: Depression_50 ; Anxiety_51   Heme: Bleeding problems _52 ; Clotting disorders _53 ; Anemia _54   Endocrine: Diabetes _55 ; Thyroid dysfunction_56   Home Medications Prior to Admission medications   Medication Sig Start Date End Date Taking? Authorizing Provider  Accu-Chek Softclix Lancets lancets use to check blood sugar up to four times daily 02/21/22   Thurnell Lose, MD  blood glucose meter kit and supplies KIT Dispense based on patient and insurance preference. Use up to four times daily as directed. (FOR ICD-9 250.00, 250.01). For QAC - HS accuchecks. 02/21/22   Thurnell Lose, MD  blood glucose meter kit and supplies use to check blood sugars 4 times daily 02/21/22   Thurnell Lose, MD  carvedilol (COREG) 25 MG tablet Take 1 tablet (25 mg total) by mouth 2 (two) times  daily with a meal. Patient not taking: Reported on 06/02/2022 02/21/22   Thurnell Lose, MD  dapagliflozin propanediol (FARXIGA) 10 MG TABS tablet Take 1 tablet (10 mg total) by mouth daily. Patient not taking: Reported on 06/02/2022 02/22/22   Thurnell Lose, MD  fenofibrate 54 MG tablet Take 1 tablet (54 mg total) by mouth daily. Patient not taking: Reported on 06/02/2022 02/21/22   Thurnell Lose, MD  glimepiride (AMARYL) 2 MG tablet Take 2 tablets (4 mg total) by mouth daily with breakfast. Patient not taking: Reported on 06/02/2022 02/22/22   Thurnell Lose, MD  glucose blood (ACCU-CHEK GUIDE) test strip Use to test 4 (four) times daily -  before meals and at bedtime. 02/21/22   Thurnell Lose, MD  metFORMIN (GLUCOPHAGE) 500 MG tablet Take 1 tablet (500 mg total) by mouth 2 (two) times daily with a meal. Patient not taking: Reported on 06/02/2022 02/21/22 03/23/22  Thurnell Lose, MD  sacubitril-valsartan (ENTRESTO) 24-26 MG Take 1 tablet by mouth 2 (two) times daily. Patient not taking: Reported on 06/02/2022 02/21/22   Thurnell Lose, MD  spironolactone (ALDACTONE) 25 MG tablet Take 1/2 tablet (12.5 mg total) by mouth daily. Patient not taking: Reported on 06/02/2022 02/22/22   Thurnell Lose, MD    Past Medical History: Past Medical History:  Diagnosis Date   Heart failure with reduced ejection fraction [facility]) 2017   [facility]   Hypertension 2017   [facility]    Past Surgical History: Past Surgical History:  Procedure Laterality Date   RIGHT/LEFT HEART CATH AND CORONARY ANGIOGRAPHY N/A 02/16/2022   Procedure: RIGHT/LEFT HEART CATH AND CORONARY ANGIOGRAPHY;  Surgeon: Sherren Mocha, MD;  Location: [facility];  Service: Cardiovascular;  Laterality: N/A;    Family History: Family History  Problem Relation Age of Onset   Hypertension Father     Social History: Social History   Socioeconomic History   Marital status: Single    Spouse  name: Not on file   Number of children: 3   Years of education: Not on file   Highest education level: 7th grade  Occupational History   Occupation: Curator    Comment: varies  Tobacco Use   Smoking status: Never   Smokeless tobacco: Never  Vaping Use   Vaping Use: Never used  Substance and Sexual Activity   Alcohol use: Yes    Alcohol/week: 30.0 standard drinks of alcohol    Types: 30 Cans of beer per week    Comment: 30-40 on weekends   Drug use: Yes    Types: "Crack" cocaine, Cocaine    Comment: 2 weeks   Sexual activity: Not on file  Other Topics Concern   Not on file  Social History Narrative   Not on file   Social Determinants of Health   Financial Resource Strain: High Risk (02/20/2022)   Overall  Financial Resource Strain (CARDIA)    Difficulty of Paying Living Expenses: Hard  Food Insecurity: No Food Insecurity (02/20/2022)   Hunger Vital Sign    Worried About Running Out of Food in the Last Year: Never true    Ran Out of Food in the Last Year: Never true  Transportation Needs: No Transportation Needs (02/20/2022)   PRAPARE - Hydrologist (Medical): No    Lack of Transportation (Non-Medical): No  Physical Activity: Not on file  Stress: Not on file  Social Connections: Not on file    Allergies:  No Known Allergies  Objective:    Vital Signs:   Temp:  [97.6 F (36.4 C)-97.9 F (36.6 C)] 97.6 F (36.4 C) (09/22 0856) Pulse Rate:  [77-108] 77 (09/22 0845) Resp:  [18-25] 18 (09/22 0845) BP: (113-175)/(65-111) 120/81 (09/22 0845) SpO2:  [92 %-100 %] 96 % (09/22 0845) Weight:  [98.9 kg] 98.9 kg (09/22 0809)    Weight change: Filed Weights   06/02/22 0809  Weight: 98.9 kg    Intake/Output:   Intake/Output Summary (Last 24 hours) at 06/02/2022 0943 Last data filed at 06/02/2022 0647 Gross per 24 hour  Intake --  Output 2300 ml  Net -2300 ml      Physical Exam    General:  Resting comfortably in bed.  HEENT:  normal Neck: supple. JVP ~ 10 cm . Carotids 2+ bilat; no bruits.  Cor: PMI nondisplaced. Regular rate & rhythm. No rubs, gallops or murmurs. Lungs: clear Abdomen: obese, soft, nontender, + distended.  Extremities: no cyanosis, clubbing, rash, 1+ edema Neuro: alert & orientedx3, cranial nerves grossly intact. moves all 4 extremities w/o difficulty. Affect pleasant   Telemetry   Sinus 80s  EKG    Sinus tach 112 bpm,   Labs   Basic Metabolic Panel: Recent Labs  Lab 06/02/22 0322  NA 137  K 4.0  CL 100  CO2 26  GLUCOSE 202*  BUN 22*  CREATININE 1.49*  CALCIUM 9.6    Liver Function Tests: Recent Labs  Lab 06/02/22 0322  AST 31  ALT 17  ALKPHOS 61  BILITOT 0.9  PROT 6.3*  ALBUMIN 3.1*   No results for input(s): "LIPASE", "AMYLASE" in the last 168 hours. No results for input(s): "AMMONIA" in the last 168 hours.  CBC: Recent Labs  Lab 06/02/22 0322  WBC 11.4*  NEUTROABS 9.0*  HGB 16.3  HCT 48.4  MCV 90.0  PLT 259    Cardiac Enzymes: No results for input(s): "CKTOTAL", "CKMB", "CKMBINDEX", "TROPONINI" in the last 168 hours.  BNP: BNP (last 3 results) Recent Labs    02/15/22 0342 02/17/22 0611 06/02/22 0322  BNP 495.3* 193.8* 551.9*    ProBNP (last 3 results) No results for input(s): "PROBNP" in the last 8760 hours.   CBG: No results for input(s): "GLUCAP" in the last 168 hours.  Coagulation Studies: No results for input(s): "LABPROT", "INR" in the last 72 hours.   Imaging   DG Chest Port 1 View  Result Date: 06/02/2022 CLINICAL DATA:  Shortness of breath EXAM: PORTABLE CHEST 1 VIEW COMPARISON:  02/16/2022 FINDINGS: Diffuse airspace opacity of the chest. There is recent diagnosis of pneumonia and some consideration in the chart of sarcoid. Cardiomegaly and vascular pedicle widening. No visible effusion or pneumothorax. Artifact from EKG leads. IMPRESSION: 1. Bilateral airspace disease which is largely chronic based on prior studies. 2.  Chronic cardiomegaly and vascular pedicle widening. 3. The above could easily obscure acute  pneumonia or failure. Electronically Signed   By: Jorje Guild M.D.   On: 06/02/2022 04:16     Medications:     Current Medications:  [START ON 06/03/2022] aspirin EC  81 mg Oral Daily   docusate sodium  100 mg Oral BID   enoxaparin (LOVENOX) injection  40 mg Subcutaneous Q24H   fenofibrate  54 mg Oral Daily   folic acid  1 mg Oral Daily   furosemide  40 mg Intravenous BID   insulin aspart  0-15 Units Subcutaneous TID WC   insulin aspart  0-5 Units Subcutaneous QHS   multivitamin with minerals  1 tablet Oral Daily   sacubitril-valsartan  1 tablet Oral BID   sodium chloride flush  3 mL Intravenous Q12H   spironolactone  12.5 mg Oral Daily   thiamine  100 mg Oral Daily   Or   thiamine  100 mg Intravenous Daily    Infusions:     Patient Profile   41 y.o. male with history of chronic systolic heart failure with previously recovered EF, uncontrolled HTN, DM II, polysubstance abuse, noncompliance. Admitted with a/c CHF.  Assessment/Plan   Acute on chronic systolic CHF: -EF severely reduced in 2017 in setting of uncontrolled HTN. EF recovered in 2019. -Echo 02/09/22: EF 30-35%, RV okay, mild to moderate MR. -R/LHC 02/16/22: No significant CAD, RA mean 1, PA mean 19, PCWP 3, LVEDP 7, Fick CO/CI 4.17/1.99 -cMRI 02/20/22: LVEF 29%, LV severely dilated, asymmetric LVH with basal septum measuring 17 mm, RVEF 45%, patchy LGE in setpum and RV insertion sites + subepicardial LGE in inferolateral wall. Scar pattern felt to be consistent with HCM. However, d/t subepicardial LGE in inferolateral wall and presence of mediastinal lymphadenopathy, cardiac PET recommended to r/o sarcoid, small pericardial effusion.  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 Volume overloaded. Increase IV lasix to 80 BID -Does not look  low-output. LFTs okay, CO2 26. Check lactic acid. -Increase entresto to 49/51 BID -Increase spiro to 25 daily -Add Farxiga back next -Hold coreg for now with acute exacerbation  2. NSVT: -Noted during recent admit in June -Follow on tele  3. Uncontrolled HTN: -Meds as above -Reiterated importance of compliance with medical therapy  4. Polysubstance abuse: -ETOH and crack/cocaine -Cessation discussed -Consult HF CSW to assist with resources  5. DM II: -A1c 9.9 in June -Not taking any meds PTA -Recheck A1c  6. CKD II: - Scr averaging 1.3-1.5 - Follow with diuresis   SDOH: -Uninsured. Not a [place] citizen.  -HF TOC CSW consulted -Would be a good candidate for paramedicine if willing to participate.   Length of Stay: 0  Sherisse Fullilove N, PA-C  06/02/2022, 9:43 AM  Advanced Heart Failure Team Pager [phone] (M-F; 7a - 5p)  Please contact [facility] for night-coverage after hours (4p -7a ) and weekends on [email or web address]

## 2022-06-03 DIAGNOSIS — N189 Chronic kidney disease, unspecified: Secondary | ICD-10-CM

## 2022-06-03 DIAGNOSIS — F1910 Other psychoactive substance abuse, uncomplicated: Secondary | ICD-10-CM

## 2022-06-03 DIAGNOSIS — N179 Acute kidney failure, unspecified: Secondary | ICD-10-CM

## 2022-06-03 DIAGNOSIS — E1169 Type 2 diabetes mellitus with other specified complication: Secondary | ICD-10-CM

## 2022-06-03 DIAGNOSIS — E785 Hyperlipidemia, unspecified: Secondary | ICD-10-CM

## 2022-06-03 DIAGNOSIS — I10 Essential (primary) hypertension: Secondary | ICD-10-CM

## 2022-06-03 LAB — CBC
HCT: 51.6 % (ref 39.0–52.0)
Hemoglobin: 16.9 g/dL (ref 13.0–17.0)
MCH: 29.5 pg (ref 26.0–34.0)
MCHC: 32.8 g/dL (ref 30.0–36.0)
MCV: 90.1 fL (ref 80.0–100.0)
Platelets: 298 10*3/uL (ref 150–400)
RBC: 5.73 MIL/uL (ref 4.22–5.81)
RDW: 14.7 % (ref 11.5–15.5)
WBC: 9.1 10*3/uL (ref 4.0–10.5)
nRBC: 0 % (ref 0.0–0.2)

## 2022-06-03 LAB — BASIC METABOLIC PANEL
Anion gap: 6 (ref 5–15)
BUN: 22 mg/dL — ABNORMAL HIGH (ref 6–20)
CO2: 31 mmol/L (ref 22–32)
Calcium: 9.1 mg/dL (ref 8.9–10.3)
Chloride: 97 mmol/L — ABNORMAL LOW (ref 98–111)
Creatinine, Ser: 1.24 mg/dL (ref 0.61–1.24)
GFR, Estimated: >60 mL/min (ref >60)
Glucose, Bld: 126 mg/dL — ABNORMAL HIGH (ref 70–99)
Potassium: 3.8 mmol/L (ref 3.5–5.1)
Sodium: 134 mmol/L — ABNORMAL LOW (ref 135–145)

## 2022-06-03 LAB — GLUCOSE, CAPILLARY
Glucose-Capillary: 146 mg/dL — ABNORMAL HIGH (ref 70–99)
Glucose-Capillary: 159 mg/dL — ABNORMAL HIGH (ref 70–99)
Glucose-Capillary: 141 mg/dL — ABNORMAL HIGH (ref 70–99)
Glucose-Capillary: 191 mg/dL — ABNORMAL HIGH (ref 70–99)

## 2022-06-03 LAB — MAGNESIUM: Magnesium: 2 mg/dL (ref 1.7–2.4)

## 2022-06-03 MED ORDER — ACETAZOLAMIDE 250 MG PO TABS
250.0000 mg | ORAL_TABLET | Freq: Once | ORAL | Status: AC
  Administered 2022-06-03: 250 mg via ORAL
  Filled 2022-06-03: qty 1

## 2022-06-03 MED ORDER — FUROSEMIDE 10 MG/ML IJ SOLN
60.0000 mg | Freq: Two times a day (BID) | INTRAMUSCULAR | Status: DC
  Administered 2022-06-03: 60 mg via INTRAVENOUS
  Filled 2022-06-03: qty 6

## 2022-06-03 MED ORDER — SACUBITRIL-VALSARTAN 97-103 MG PO TABS
1.0000 | ORAL_TABLET | Freq: Two times a day (BID) | ORAL | Status: DC
  Administered 2022-06-03 – 2022-06-04 (×3): 1 via ORAL
  Filled 2022-06-03 (×4): qty 1

## 2022-06-03 MED ORDER — POTASSIUM CHLORIDE CRYS ER 20 MEQ PO TBCR
40.0000 meq | EXTENDED_RELEASE_TABLET | Freq: Once | ORAL | Status: AC
  Administered 2022-06-03: 40 meq via ORAL
  Filled 2022-06-03: qty 2

## 2022-06-03 NOTE — Assessment & Plan Note (Addendum)
Glucose remained well controlled during his hospitalization, he was placed on insulin sliding scale for glucose cover and monitoring.  Continue diabetic prudent diet.   Continue with ezetimibe.

## 2022-06-03 NOTE — Plan of Care (Signed)
  Problem: Activity: Goal: Capacity to carry out activities will improve Outcome: Progressing   Problem: Activity: Goal: Risk for activity intolerance will decrease Outcome: Progressing   

## 2022-06-03 NOTE — Assessment & Plan Note (Signed)
Echocardiogram with reduced LV systolic function 15 to 84%, with global hypokinesis. RV with moderate reduction in systolic function, moderate to severe eccentric mitral regurgitation. Left atrium with severe dilatation.   Urine output 6,962 ml Systolic blood pressure 952 to 113 mmHg.   Plan to continue diuresis with furosemide 60 mg IV q12 hrs Continue with dapagliflozin, entresto and spironolactone.

## 2022-06-03 NOTE — Progress Notes (Signed)
Initial Nutrition Assessment  DOCUMENTATION CODES:   Obesity unspecified  INTERVENTION:  Adjust diet order to Carb modified with 2g Na restriction.  Encourage PO intake Continue MVI, thiamine, and folic acid  NUTRITION DIAGNOSIS:   Increased nutrient needs related to chronic illness (CHF) as evidenced by estimated needs.  GOAL:   Patient will meet greater than or equal to 90% of their needs  MONITOR:   PO intake, I & O's, Labs  REASON FOR ASSESSMENT:   Consult  (nutritional goals)  ASSESSMENT:   Pt with hx of CHF, HTN, DM type 2, and drug use/etoh abuse presented to ED with SOB and peripheral edema. Found to have a CHF exacerbation.  Unable to reach pt on room phone at this time.  Cardiology evaluated this AM and noted medicine noncompliance. Significant amount of fluid removed overnight and pt reported resolution of chest pain. Recorded weight likely falsely elevated due to fluid.   Pt currently with good meal intake. Will adjust diet to better meet needs and continue to monitor intake for nutritional adequacy.   Average Meal Intake: 9/22-9/23: 98% intake x 2 recorded meals  Nutritionally Relevant Medications: Scheduled Meds:  docusate sodium  100 mg Oral BID   fenofibrate  54 mg Oral Daily   folic acid  1 mg Oral Daily   furosemide  60 mg Intravenous BID   insulin aspart  0-15 Units Subcutaneous TID WC   insulin aspart  0-5 Units Subcutaneous QHS   multivitamin with minerals  1 tablet Oral Daily   spironolactone  25 mg Oral Daily   thiamine  100 mg Oral Daily   Labs Reviewed: Na 134, chloride 97 BUN 22 CBG ranges from 126-169 mg/dL over the last 24 hours HgbA1c 7.5%   Intake/Output Summary (Last 24 hours) at 06/03/2022 1125 Last data filed at 06/03/2022 1112 Gross per 24 hour  Intake 850 ml  Output 5225 ml  Net -4375 ml  Net IO Since Admission: -6,675 mL [06/03/22 1125]  NUTRITION - FOCUSED PHYSICAL EXAM: Defer to in-person assessment  Diet  Order:   Diet Order             Diet heart healthy/carb modified Room service appropriate? Yes; Fluid consistency: Thin; Fluid restriction: 1500 mL Fluid  Diet effective now                   EDUCATION NEEDS:   No education needs have been identified at this time  Skin:  Skin Assessment: Reviewed RN Assessment  Last BM:  9/22  Height:   Ht Readings from Last 1 Encounters:  06/02/22 5\' 5"  (1.651 m)    Weight:   Wt Readings from Last 1 Encounters:  06/03/22 113.9 kg    Ideal Body Weight:  61.8 kg  BMI:  Body mass index is 41.8 kg/m.  Estimated Nutritional Needs:   Kcal:  1800-2000 kcal/d  Protein:  90-110g/d  Fluid:  1.8-2L/d    Justin [name], RD, LDN Clinical Dietitian RD pager # available in AMION  After hours/weekend pager # available in [facility]

## 2022-06-03 NOTE — Assessment & Plan Note (Signed)
No signs of alcohol withdrawal, will discontinue benzodiazepines.

## 2022-06-03 NOTE — Hospital Course (Addendum)
Justin [name] was admitted to the hospital with the working diagnosis of heart failure decompensation.   41 yo male with the past medical history of heart failure, chronic hypoxemic respiratory failure and hypertension who presented with dyspnea. Recent hospitalization for heart failure 02/2022. Patient stopped taking his medications about 2 months, on the day of hospitalization he was in a party dancing when suddenly developed severe dyspnea prompting him to come to the hospital. On his initial physical examination his blood pressure was 120/81, HR 77, RR 18 and 02 saturation 96%, lungs with no wheezing or rales, heart with S1 and S2 present and rhythmic, abdomen with no distention and no lower extremity edema.  Na 137, K 4,0 CL 100 bicarbonate 26, glucose 202 bun 22 cr 1,49  BNP 551 High sensitive troponin 143, 165, 146 Wbc 11,4 hgb 16,3 plt 259    Chest radiograph with cardiomegaly, bilateral hilar vascular congestion, bilateral cephalization of the vasculature, bilateral symmetric interstitial infiltrates, predominant at the lower lobes.  EKG 112 bpm, right axis deviation, normal intervals, sinus rhythm with left atrial enlargement, J point elevation V2 to V4, with LVH, no significant T wave changes.   Patient was placed on furosemide for diuresis.  09/24 volume has improved 09/25 prolonged hospitalization due to renal failure.

## 2022-06-03 NOTE — Consult Note (Addendum)
Advanced Heart Failure Team Consult Note   Primary Physician: Patient, No Pcp Per PCP-Cardiologist:  Buford Dresser, MD  Reason for Consultation: Acute on chronic systolic CHF  Subjective / Interval hx  Significant improvement in shortness of breath and lower extremity edema after IV diuresis yesterday.  States his chest pain has also resolved. Objective:    Vital Signs:   Temp:  [97.8 F (36.6 C)-98.5 F (36.9 C)] 98 F (36.7 C) (09/23 0832) Pulse Rate:  [73-92] 83 (09/23 0346) Resp:  [18-20] 18 (09/23 0832) BP: (113-138)/(75-99) 113/98 (09/23 0832) SpO2:  [92 %-99 %] 92 % (09/23 0819) Weight:  [113.9 kg] 113.9 kg (09/23 0346) Last BM Date : 06/02/22  Weight change: Filed Weights   06/02/22 0809 06/03/22 0346  Weight: 98.9 kg 113.9 kg    Intake/Output:   Intake/Output Summary (Last 24 hours) at 06/03/2022 1033 Last data filed at 06/03/2022 0839 Gross per 24 hour  Intake 850 ml  Output 3725 ml  Net -2875 ml       Physical Exam    General:  Resting comfortably in bed.  HEENT: normal Neck: supple. JVP 9-10 Cor: PMI nondisplaced. Regular rate & rhythm. No rubs, gallops or murmurs. Lungs: CTA bilaterally Abdomen: obese, soft, nontender, + distended.  Extremities: no cyanosis, clubbing, rash, warm to touch with 1+ edema Neuro: alert & orientedx3, cranial nerves grossly intact. moves all 4 extremities w/o difficulty. Affect pleasant   Telemetry   Sinus in the 90s  EKG    Sinus tach 112 bpm,   Labs   Basic Metabolic Panel: Recent Labs  Lab 06/02/22 0322 06/03/22 0400  NA 137 134*  K 4.0 3.8  CL 100 97*  CO2 26 31  GLUCOSE 202* 126*  BUN 22* 22*  CREATININE 1.49* 1.24  CALCIUM 9.6 9.1  MG  --  2.0     Liver Function Tests: Recent Labs  Lab 06/02/22 0322  AST 31  ALT 17  ALKPHOS 61  BILITOT 0.9  PROT 6.3*  ALBUMIN 3.1*    No results for input(s): "LIPASE", "AMYLASE" in the last 168 hours. No results for input(s): "AMMONIA"  in the last 168 hours.  CBC: Recent Labs  Lab 06/02/22 0322 06/03/22 0400  WBC 11.4* 9.1  NEUTROABS 9.0*  --   HGB 16.3 16.9  HCT 48.4 51.6  MCV 90.0 90.1  PLT 259 298     Cardiac Enzymes: No results for input(s): "CKTOTAL", "CKMB", "CKMBINDEX", "TROPONINI" in the last 168 hours.  BNP: BNP (last 3 results) Recent Labs    02/15/22 0342 02/17/22 0611 06/02/22 0322  BNP 495.3* 193.8* 551.9*     ProBNP (last 3 results) No results for input(s): "PROBNP" in the last 8760 hours.   CBG: Recent Labs  Lab 06/02/22 1326 06/02/22 1647 06/02/22 2102 06/03/22 0617  GLUCAP 146* 169* 133* 146*    Coagulation Studies: No results for input(s): "LABPROT", "INR" in the last 72 hours.   Imaging   ECHOCARDIOGRAM COMPLETE  Result Date: 06/02/2022    ECHOCARDIOGRAM REPORT   Patient Name:   Justin [name] Date of Exam: 06/02/2022 Medical Rec #:  [id number]              Height:       65.0 in Accession #:    [id number]             Weight:       218.0 lb Date of Birth:  31-Dec-1980  BSA:          2.052 m Patient Age:    41 years               BP:           133/84 mmHg Patient Gender: M                      HR:           80 bpm. Exam Location:  Inpatient Procedure: 2D Echo, Cardiac Doppler and Color Doppler Indications:    CHF  History:        Patient has prior history of Echocardiogram examinations, most                 recent 02/09/2022. CHF, Arrythmias:Tachycardia,                 Signs/Symptoms:Shortness of Breath and Dyspnea; Risk                 Factors:Diabetes and Dyslipidemia.  Sonographer:    Greer Pickerel Sonographer#2:  Melissa Morford RDCS (AE, PE) Referring Phys: [id number] JENNIFER YATES  Sonographer Comments: Technically challenging study due to limited acoustic windows. Image acquisition challenging due to respiratory motion. IMPRESSIONS 1. There is severe concentric hypertrophy of the left ventricle. LV systolic function is severely reduced with an estimated EF of  15%-20% and global hypokinesis. Grade III diastolic dysfuncton with echocardiographic signs of elevated filling pressures. 2. The right ventricle is mildly dilated with moderately reduced systolic function. 3. There is at least moderate eccentric mitral regurgitation. The MR jet is likely underestimated due to its eccentricity. 4. The left atrium is severely enlarged. FINDINGS  Left Ventricle: Left ventricular ejection fraction, by estimation, is <20%. The left ventricle has severely decreased function. The left ventricle has no regional wall motion abnormalities. The left ventricular internal cavity size was normal in size. There is severe concentric left ventricular hypertrophy. Left ventricular diastolic parameters are consistent with Grade III diastolic dysfunction (restrictive). Right Ventricle: The right ventricular size is mildly enlarged. No increase in right ventricular wall thickness. Right ventricular systolic function is moderately reduced. Left Atrium: Left atrial size was severely dilated. Right Atrium: Right atrial size was normal in size. Pericardium: There is no evidence of pericardial effusion. Mitral Valve: The mitral valve is normal in structure. Moderate to severe mitral valve regurgitation. No evidence of mitral valve stenosis. Tricuspid Valve: The tricuspid valve is normal in structure. Tricuspid valve regurgitation is trivial. No evidence of tricuspid stenosis. Aortic Valve: The aortic valve is normal in structure. Aortic valve regurgitation is not visualized. No aortic stenosis is present. Pulmonic Valve: The pulmonic valve was normal in structure. Pulmonic valve regurgitation is trivial. No evidence of pulmonic stenosis. Aorta: The aortic root is normal in size and structure. Venous: The inferior vena cava is normal in size with greater than 50% respiratory variability, suggesting right atrial pressure of 3 mmHg. IAS/Shunts: No atrial level shunt detected by color flow Doppler.  LEFT  VENTRICLE PLAX 2D LVIDd:         5.50 cm      Diastology LVIDs:         4.60 cm      LV e' medial:    4.68 cm/s LV PW:         2.00 cm      LV E/e' medial:  24.4 LV IVS:        2.00 cm  LV e' lateral:   5.00 cm/s LVOT diam:     2.20 cm      LV E/e' lateral: 22.8 LVOT Area:     3.80 cm  LV Volumes (MOD) LV vol d, MOD A2C: 161.0 ml LV vol d, MOD A4C: 181.0 ml LV vol s, MOD A2C: 117.0 ml LV vol s, MOD A4C: 138.0 ml LV SV MOD A2C:     44.0 ml LV SV MOD A4C:     181.0 ml LV SV MOD BP:      47.5 ml RIGHT VENTRICLE RV S prime:     9.24 cm/s TAPSE (M-mode): 1.6 cm LEFT ATRIUM              Index        RIGHT ATRIUM           Index LA diam:        5.40 cm  2.63 cm/m   RA Area:     30.70 cm LA Vol (A2C):   168.0 ml 81.87 ml/m  RA Volume:   111.00 ml 54.10 ml/m LA Vol (A4C):   188.0 ml 91.62 ml/m LA Biplane Vol: 179.0 ml 87.24 ml/m   AORTA Ao Root diam: 3.40 cm Ao Asc diam:  3.70 cm MITRAL VALVE MV Area (PHT): 7.09 cm       SHUNTS MV Decel Time: 107 msec       Systemic Diam: 2.20 cm MR Peak grad:    116.2 mmHg MR Mean grad:    80.0 mmHg MR Vmax:         539.00 cm/s MR Vmean:        421.0 cm/s MR PISA:         5.09 cm MR PISA Eff ROA: 36 mm MR PISA Radius:  0.90 cm MV E velocity: 114.00 cm/s MV A velocity: 45.60 cm/s MV E/A ratio:  2.50 Bandon Sherwin Electronically signed by Hebert Soho Signature Date/Time: 06/02/2022/7:26:05 PM    Final      Medications:     Current Medications:  acetaZOLAMIDE  250 mg Oral Once   aspirin EC  81 mg Oral Daily   dapagliflozin propanediol  10 mg Oral Daily   docusate sodium  100 mg Oral BID   enoxaparin (LOVENOX) injection  40 mg Subcutaneous Q24H   fenofibrate  54 mg Oral Daily   folic acid  1 mg Oral Daily   furosemide  60 mg Intravenous BID   insulin aspart  0-15 Units Subcutaneous TID WC   insulin aspart  0-5 Units Subcutaneous QHS   multivitamin with minerals  1 tablet Oral Daily   sacubitril-valsartan  1 tablet Oral BID   sodium chloride flush  3 mL  Intravenous Q12H   spironolactone  25 mg Oral Daily   thiamine  100 mg Oral Daily   Or   thiamine  100 mg Intravenous Daily    Infusions:     Patient Profile   41 y.o. male with history of chronic systolic heart failure with previously recovered EF, uncontrolled HTN, DM II, polysubstance abuse, noncompliance. Admitted with a/c CHF.  Assessment/Plan   Acute on chronic systolic CHF: -EF severely reduced in 2017 in setting of uncontrolled HTN. EF recovered in 2019. -Echo 02/09/22: EF 30-35%, RV okay, mild to moderate MR. -R/LHC 02/16/22: No significant CAD, RA mean 1, PA mean 19, PCWP 3, LVEDP 7, Fick CO/CI 4.17/1.99 -cMRI 02/20/22: LVEF 29%, LV severely dilated, asymmetric LVH with basal septum measuring 17 mm, RVEF  45%, patchy LGE in setpum and RV insertion sites + subepicardial LGE in inferolateral wall. Scar pattern felt to be consistent with HCM. However, d/t subepicardial LGE in inferolateral wall and presence of mediastinal lymphadenopathy, cardiac PET recommended to r/o sarcoid, small pericardial effusion.  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 Volume overloaded.  -Increase Entresto to 97/103 twice daily, decrease Lasix to 60 mg significant improvement in volume status.  We will give 1 dose of Diamox 250. -Plan to start beta-blocker tomorrow.  2. NSVT: -Will consider ziopatch at discharge if he continues to have a significant burden of NSVT.   3. Uncontrolled HTN: -Medication compliance discussed at length.  -Uptitrating medical therapy.  4. Polysubstance abuse: -ETOH and crack/cocaine -Cessation discussed -Consult HF CSW to assist with resources  5. DM II: -A1c 9.9 in June -Not taking any meds PTA -Repeat A1C this admission of 7.5  6. CKD II: - Scr averaging 1.3-1.5 - Follow with diuresis   SDOH: -Uninsured. Not a [place] citizen.  -HF TOC CSW consulted -Would be a good  candidate for paramedicine if willing to participate.   Length of Stay: [place], DO  06/03/2022, 10:33 AM

## 2022-06-03 NOTE — Progress Notes (Signed)
Progress Note   Patient: Justin [name] [id number] DOB: 06-03-1981 DOA: 06/02/2022     1 DOS: the patient was seen and examined on 06/03/2022   Brief hospital course: Justin [name] was admitted to the hospital with the working diagnosis of heart failure decompensation.   41 yo male with the past medical history of heart failure, chronic hypoxemic respiratory failure and hypertension who presented with dyspnea. Recent hospitalization for heart failure 02/2022. Patient stopped taking his medications about 2 months, on the day of hospitalization he was in a party dancing when suddenly developed severe dyspnea prompting him to come to the hospital. On his initial physical examination his blood pressure was 120/81, HR 77, RR 18 and 02 saturation 96%, lungs with no wheezing or rales, heart with S1 and S2 present and rhythmic, abdomen with no distention and no lower extremity edema.  Na 137, K 4,0 CL 100 bicarbonate 26, glucose 202 bun 22 cr 1,49  BNP 551 High sensitive troponin 143, 165, 146 Wbc 11,4 hgb 16,3 plt 259    Chest radiograph with cardiomegaly, bilateral hilar vascular congestion, bilateral cephalization of the vasculature, bilateral symmetric interstitial infiltrates, predominant at the lower lobes.  EKG 112 bpm, right axis deviation, normal intervals, sinus rhythm with left atrial enlargement, J point elevation V2 to V4, with LVH, no significant T wave changes.   Patient was placed on furosemide for diuresis.   Assessment and Plan: * Acute on chronic systolic CHF (congestive heart failure) (HCC) Echocardiogram with reduced LV systolic function 15 to 123456, with global hypokinesis. RV with moderate reduction in systolic function, moderate to severe eccentric mitral regurgitation. Left atrium with severe dilatation.   Urine output [id number] ml Systolic blood pressure [id number] to 113 mmHg.   Plan to continue diuresis with furosemide 60 mg IV q12 hrs Continue with dapagliflozin,  entresto and spironolactone.   Essential hypertension Continue blood pressure control with entresto and spironolactone Will resume B blocker during this hospitalization.   Type 2 diabetes mellitus with hyperlipidemia (HCC) Fasting glucose is controlled 126 this am Continue diabetic prudent diet.  Insulin sliding scale for glucose cover and monitoring  Continue with ezetimibe.    Acute kidney injury superimposed on chronic kidney disease (HCC) CKD stage 2, Hyponatremia,   Renal function with serum cr at 1,24 with K at 3,8 and serum bicarbonate at 31. Plan to continue diuresis and follow up renal function in am One dose of acetazolamide today.  Add 40 meq Kcl to prevent hypokalemia,   Polysubstance abuse (HCC) No signs of alcohol withdrawal, will discontinue benzodiazepines.         Subjective: patient with improvement in dyspnea, no chest pain   Physical Exam: Vitals:   06/02/22 2224 06/03/22 0346 06/03/22 0819 06/03/22 0832  BP: (!) 138/91 127/75 120/89 (!) 113/98  Pulse: 92 83    Resp: 20  18 18   Temp: 98.5 F (36.9 C) 98 F (36.7 C)  98 F (36.7 C)  TempSrc: Oral   Oral  SpO2: 98% 98% 92%   Weight:  113.9 kg    Height:       Neurology awake and alert ENT with mild pallor Cardiovascular with S1 and S2 present and rhythmic with no gallops, positive murmur at the apex No JVD No lower extremity edema Respiratory with rales at bases with no wheezing or rhonchi Abdomen with no distention  Data Reviewed:    Family Communication: no family at the bedside   Disposition: Status is: Inpatient Remains inpatient  appropriate because: heart failure   Planned Discharge Destination: Home      Author: Tawni Millers, MD 06/03/2022 11:43 AM  For on call review [email or web address].

## 2022-06-03 NOTE — Assessment & Plan Note (Addendum)
CKD stage 2, Hyponatremia,   Renal function with serum cr at 1,24 with K at 3,8 and serum bicarbonate at 31. Plan to continue diuresis and follow up renal function in am One dose of acetazolamide today.  Add 40 meq Kcl to prevent hypokalemia,

## 2022-06-03 NOTE — Assessment & Plan Note (Signed)
Continue blood pressure control with losartan, spironolactone and metoprolol succinate.

## 2022-06-04 DIAGNOSIS — E66813 Obesity, class 3: Secondary | ICD-10-CM | POA: Diagnosis present

## 2022-06-04 LAB — BASIC METABOLIC PANEL
Anion gap: 13 (ref 5–15)
BUN: 29 mg/dL — ABNORMAL HIGH (ref 6–20)
CO2: 25 mmol/L (ref 22–32)
Calcium: 9.7 mg/dL (ref 8.9–10.3)
Chloride: 98 mmol/L (ref 98–111)
Creatinine, Ser: 1.66 mg/dL — ABNORMAL HIGH (ref 0.61–1.24)
GFR, Estimated: 52 mL/min — ABNORMAL LOW (ref >60)
Glucose, Bld: 138 mg/dL — ABNORMAL HIGH (ref 70–99)
Potassium: 3.9 mmol/L (ref 3.5–5.1)
Sodium: 136 mmol/L (ref 135–145)

## 2022-06-04 LAB — GLUCOSE, CAPILLARY
Glucose-Capillary: 167 mg/dL — ABNORMAL HIGH (ref 70–99)
Glucose-Capillary: 146 mg/dL — ABNORMAL HIGH (ref 70–99)
Glucose-Capillary: 127 mg/dL — ABNORMAL HIGH (ref 70–99)
Glucose-Capillary: 296 mg/dL — ABNORMAL HIGH (ref 70–99)

## 2022-06-04 LAB — MAGNESIUM: Magnesium: 2.4 mg/dL (ref 1.7–2.4)

## 2022-06-04 MED ORDER — METOPROLOL SUCCINATE ER 25 MG PO TB24
12.5000 mg | ORAL_TABLET | Freq: Every day | ORAL | Status: DC
  Administered 2022-06-04 – 2022-06-05 (×2): 12.5 mg via ORAL
  Filled 2022-06-04 (×2): qty 1

## 2022-06-04 NOTE — Assessment & Plan Note (Signed)
Calculated BMI is 40,6

## 2022-06-04 NOTE — Consult Note (Signed)
Advanced Heart Failure Team Consult Note   Primary Physician: Patient, No Pcp Per PCP-Cardiologist:  Buford Dresser, MD  Reason for Consultation: Acute on chronic systolic CHF  Subjective / Interval hx  Laying flat, comfortably in bed this morning with no signs of distress.  Patient states his dyspnea has completely resolved now after 5 L of urine output yesterday. Objective:    Vital Signs:   Temp:  [97.8 F (36.6 C)-98.1 F (36.7 C)] 98.1 F (36.7 C) (09/24 0837) Pulse Rate:  [85-96] 88 (09/24 0837) Resp:  [18] 18 (09/24 0837) BP: (98-126)/(70-84) 101/74 (09/24 0837) SpO2:  [94 %-97 %] 97 % (09/24 0837) Weight:  [110.8 kg] 110.8 kg (09/24 0004) Last BM Date : 06/02/22  Weight change: Filed Weights   06/02/22 0809 06/03/22 0346 06/04/22 0004  Weight: 98.9 kg 113.9 kg 110.8 kg    Intake/Output:   Intake/Output Summary (Last 24 hours) at 06/04/2022 1030 Last data filed at 06/04/2022 0423 Gross per 24 hour  Intake 1440 ml  Output 4650 ml  Net -3210 ml       Physical Exam    General:  Resting comfortably in bed.  HEENT: normal Neck: supple. JVP <8cm  Cor: PMI nondisplaced. Regular rate & rhythm. No rubs, gallops or murmurs. Lungs: CTA bilaterally Abdomen: obese, soft, nontender, + distended.  Extremities: no cyanosis, clubbing, rash, warm to touch with no edema Neuro: alert & orientedx3, cranial nerves grossly intact. moves all 4 extremities w/o difficulty. Affect pleasant   Telemetry   Sinus in the 90s  EKG    Sinus tach 112 bpm,   Labs   Basic Metabolic Panel: Recent Labs  Lab 06/02/22 0322 06/03/22 0400 06/04/22 0542  NA 137 134* 136  K 4.0 3.8 3.9  CL 100 97* 98  CO2 26 31 25   GLUCOSE 202* 126* 138*  BUN 22* 22* 29*  CREATININE 1.49* 1.24 1.66*  CALCIUM 9.6 9.1 9.7  MG  --  2.0 2.4    Liver Function Tests: Recent Labs  Lab 06/02/22 0322  AST 31  ALT 17  ALKPHOS 61  BILITOT 0.9  PROT 6.3*  ALBUMIN 3.1*     CBC: Recent Labs  Lab 06/02/22 0322 06/03/22 0400  WBC 11.4* 9.1  NEUTROABS 9.0*  --   HGB 16.3 16.9  HCT 48.4 51.6  MCV 90.0 90.1  PLT 259 298    BNP: BNP (last 3 results) Recent Labs    02/15/22 0342 02/17/22 0611 06/02/22 0322  BNP 495.3* 193.8* 551.9*     CBG: Recent Labs  Lab 06/03/22 0617 06/03/22 1115 06/03/22 1707 06/03/22 2146 06/04/22 0608  GLUCAP 146* 159* 141* 191* 167*     Imaging   Reviewed Medications:     Current Medications:  aspirin EC  81 mg Oral Daily   dapagliflozin propanediol  10 mg Oral Daily   docusate sodium  100 mg Oral BID   enoxaparin (LOVENOX) injection  40 mg Subcutaneous Q24H   fenofibrate  54 mg Oral Daily   folic acid  1 mg Oral Daily   insulin aspart  0-15 Units Subcutaneous TID WC   insulin aspart  0-5 Units Subcutaneous QHS   metoprolol succinate  12.5 mg Oral Daily   multivitamin with minerals  1 tablet Oral Daily   sacubitril-valsartan  1 tablet Oral BID   sodium chloride flush  3 mL Intravenous Q12H   spironolactone  25 mg Oral Daily    Infusions:  Patient Profile   41 y.o. male with history of chronic systolic heart failure with previously recovered EF, uncontrolled HTN, DM II, polysubstance abuse, noncompliance. Admitted with a/c CHF.  Assessment/Plan   Acute on chronic systolic CHF: -EF severely reduced in 2017 in setting of uncontrolled HTN. EF recovered in 2019. -Echo 02/09/22: EF 30-35%, RV okay, mild to moderate MR. -R/LHC 02/16/22: No significant CAD, RA mean 1, PA mean 19, PCWP 3, LVEDP 7, Fick CO/CI 4.17/1.99 -cMRI 02/20/22: LVEF 29%, LV severely dilated, asymmetric LVH with basal septum measuring 17 mm, RVEF 45%, patchy LGE in setpum and RV insertion sites + subepicardial LGE in inferolateral wall. Scar pattern felt to be consistent with HCM. However, d/t subepicardial LGE in inferolateral wall and presence of mediastinal lymphadenopathy, cardiac PET recommended to r/o sarcoid, small  pericardial effusion.  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 Volume overloaded.  -Continue Entresto to 97/103 twice daily. -Slight AKI today likely secondary to overdiuresis.  We will hold off on further Lasix, euvolemic on exam today.  Start metoprolol XL 12.5 mg daily.  Plan for discharge tomorrow  2. NSVT: -Will consider ziopatch at discharge if he continues to have a significant burden of NSVT.  -No significant events overnight telemetry overnight  3. Uncontrolled HTN: -Medication compliance discussed at length.  -Uptitrating medical therapy.  Controlled over the last 24 hours  4. Polysubstance abuse: -ETOH and crack/cocaine -Cessation discussed -Consult HF CSW to assist with resources  5. DM II: -A1c 9.9 in June -Not taking any meds PTA -Repeat A1C this admission of 7.5  6. CKD II: - Scr averaging 1.3-1.5 -AKI today, will hold off on further diuresis.   SDOH: -Uninsured. Not a [place] citizen.  -HF TOC CSW consulted -Would be a good candidate for paramedicine if willing to participate.   Length of Stay: 2  Justin Rekowski, DO  06/04/2022, 10:30 AM

## 2022-06-04 NOTE — Progress Notes (Signed)
Patient oxygen saturation on room air 88% upon entering room. Provided 1L [place] currently at 92%. Patient lying supine.  Daymon Larsen, RN

## 2022-06-04 NOTE — Progress Notes (Signed)
Progress Note   Patient: Justin [name] [id number] DOB: 16-Jun-1981 DOA: 06/02/2022     2 DOS: the patient was seen and examined on 06/04/2022   Brief hospital course: Mr. Cost was admitted to the hospital with the working diagnosis of heart failure decompensation.   41 yo male with the past medical history of heart failure, chronic hypoxemic respiratory failure and hypertension who presented with dyspnea. Recent hospitalization for heart failure 02/2022. Patient stopped taking his medications about 2 months, on the day of hospitalization he was in a party dancing when suddenly developed severe dyspnea prompting him to come to the hospital. On his initial physical examination his blood pressure was 120/81, HR 77, RR 18 and 02 saturation 96%, lungs with no wheezing or rales, heart with S1 and S2 present and rhythmic, abdomen with no distention and no lower extremity edema.  Na 137, K 4,0 CL 100 bicarbonate 26, glucose 202 bun 22 cr 1,49  BNP 551 High sensitive troponin 143, 165, 146 Wbc 11,4 hgb 16,3 plt 259    Chest radiograph with cardiomegaly, bilateral hilar vascular congestion, bilateral cephalization of the vasculature, bilateral symmetric interstitial infiltrates, predominant at the lower lobes.  EKG 112 bpm, right axis deviation, normal intervals, sinus rhythm with left atrial enlargement, J point elevation V2 to V4, with LVH, no significant T wave changes.   Patient was placed on furosemide for diuresis.  09/24 volume has improved, plan for possible discharge tomorrow if renal function stable.   Assessment and Plan: * Acute on chronic systolic CHF (congestive heart failure) (HCC) Echocardiogram with reduced LV systolic function 15 to 123456, with global hypokinesis. RV with moderate reduction in systolic function, moderate to severe eccentric mitral regurgitation. Left atrium with severe dilatation.   Urine output 123XX123 ml Systolic blood pressure [id number] to 125 mmHg.    Continue with dapagliflozin, entresto and spironolactone.  Plan to hold on loop diuretic therapy for today.  Added metoprolol succinate 25 mg daily.   Acute hypoxemic respiratory failure due to acute cardiogenic pulmonary edema. Improved oxygenation, plan to wean to room air as tolerated, to keep 02 saturation 92% or greater.   Essential hypertension Continue blood pressure control with entresto and spironolactone Added metoprolol succinate.   Type 2 diabetes mellitus with hyperlipidemia (HCC) Fasting glucose is controlled 138 this am Continue diabetic prudent diet.  Insulin sliding scale for glucose cover and monitoring  Continue with ezetimibe.    Acute kidney injury superimposed on chronic kidney disease (HCC) CKD stage 2, Hyponatremia,   Today renal function with serum cr at 1,66, with K at 3,9 and serum bicarbonate at 25. Plan to continue close follow up renal function and electrolytes Hold on loop diuretic for now.   Polysubstance abuse ([place]) No signs of alcohol withdrawal, will discontinue benzodiazepines.   Class 3 obesity (HCC) Calculated BMI is 40,6         Subjective: Patient with no chest pain or dyspnea, edema has improved.   Physical Exam: Vitals:   06/04/22 0004 06/04/22 0422 06/04/22 0837 06/04/22 1122  BP: 108/74 105/70 101/74 125/83  Pulse: 90 85 88 87  Resp:  18 18 18   Temp: 97.9 F (36.6 C) 97.8 F (36.6 C) 98.1 F (36.7 C) 98.1 F (36.7 C)  TempSrc: Oral Oral Oral Oral  SpO2: 94% 94% 97% 94%  Weight: 110.8 kg     Height:       Neurology awake and alert ENT with mild pallor Cardiovascular with S1 and S2 present  and rhythmic with no gallops, rubs. Positive murmur at the apex.  Respiratory with no rales or wheezing Abdomen with no distention  No lower extremity edema  Data Reviewed:    Family Communication: no family at the bedside   Disposition: Status is: Inpatient Remains inpatient appropriate because: heart failure and  renal failure   Planned Discharge Destination: Home    Author: Tawni Millers, MD 06/04/2022 1:48 PM  For on call review [email or web address].

## 2022-06-04 NOTE — Plan of Care (Signed)
  Problem: Activity: Goal: Capacity to carry out activities will improve Outcome: Progressing   Problem: Activity: Goal: Risk for activity intolerance will decrease Outcome: Progressing   Problem: Elimination: Goal: Will not experience complications related to urinary retention Outcome: Progressing   Problem: Safety: Goal: Ability to remain free from injury will improve Outcome: Progressing

## 2022-06-05 ENCOUNTER — Telehealth (HOSPITAL_COMMUNITY): Payer: Self-pay | Admitting: Pharmacy Technician

## 2022-06-05 LAB — BASIC METABOLIC PANEL
Anion gap: 11 (ref 5–15)
BUN: 37 mg/dL — ABNORMAL HIGH (ref 6–20)
CO2: 26 mmol/L (ref 22–32)
Calcium: 9.5 mg/dL (ref 8.9–10.3)
Chloride: 98 mmol/L (ref 98–111)
Creatinine, Ser: 1.81 mg/dL — ABNORMAL HIGH (ref 0.61–1.24)
GFR, Estimated: 47 mL/min — ABNORMAL LOW (ref >60)
Glucose, Bld: 130 mg/dL — ABNORMAL HIGH (ref 70–99)
Potassium: 3.6 mmol/L (ref 3.5–5.1)
Sodium: 135 mmol/L (ref 135–145)

## 2022-06-05 LAB — GLUCOSE, CAPILLARY
Glucose-Capillary: 153 mg/dL — ABNORMAL HIGH (ref 70–99)
Glucose-Capillary: 127 mg/dL — ABNORMAL HIGH (ref 70–99)
Glucose-Capillary: 143 mg/dL — ABNORMAL HIGH (ref 70–99)
Glucose-Capillary: 163 mg/dL — ABNORMAL HIGH (ref 70–99)

## 2022-06-05 MED ORDER — LOSARTAN POTASSIUM 50 MG PO TABS: 50.0000 mg | ORAL_TABLET | Freq: Every day | ORAL | Status: DC

## 2022-06-05 MED ORDER — EMPAGLIFLOZIN 10 MG PO TABS
10.0000 mg | ORAL_TABLET | Freq: Every day | ORAL | Status: DC
  Administered 2022-06-06: 10 mg via ORAL
  Filled 2022-06-05: qty 1

## 2022-06-05 MED ORDER — LOSARTAN POTASSIUM 50 MG PO TABS
100.0000 mg | ORAL_TABLET | Freq: Every day | ORAL | Status: DC
  Administered 2022-06-05 – 2022-06-06 (×2): 100 mg via ORAL
  Filled 2022-06-05 (×2): qty 2

## 2022-06-05 MED ORDER — METOPROLOL SUCCINATE ER 25 MG PO TB24
25.0000 mg | ORAL_TABLET | Freq: Every day | ORAL | Status: DC
  Administered 2022-06-06: 25 mg via ORAL
  Filled 2022-06-05: qty 1

## 2022-06-05 NOTE — Progress Notes (Addendum)
Advanced Heart Failure Rounding Note  PCP-Cardiologist: Buford Dresser, MD   Subjective:    Laying in bed comfortably. Denies CP and SOB, able to ambulate around room.   Objective:   Weight Range: 111.1 kg Body mass index is 40.75 kg/m.   Vital Signs:   Temp:  [97.5 F (36.4 C)-98.1 F (36.7 C)] 97.5 F (36.4 C) (09/25 0538) Pulse Rate:  [87-93] 87 (09/25 0538) Resp:  [18] 18 (09/25 0855) BP: (107-125)/(71-89) 107/72 (09/25 0855) SpO2:  [90 %-95 %] 95 % (09/25 0855) Weight:  [111.1 kg] 111.1 kg (09/25 0538) Last BM Date : 06/02/22  Weight change: Filed Weights   06/03/22 0346 06/04/22 0004 06/05/22 0538  Weight: 113.9 kg 110.8 kg 111.1 kg    Intake/Output:   Intake/Output Summary (Last 24 hours) at 06/05/2022 0857 Last data filed at 06/05/2022 0855 Gross per 24 hour  Intake 1093 ml  Output 900 ml  Net 193 ml      Physical Exam    General:  Well appearing. No resp difficulty HEENT: Normal Neck: Supple. JVP ~7. Carotids 2+ bilat; no bruits. No lymphadenopathy or thyromegaly appreciated. Cor: PMI nondisplaced. Regular rate & rhythm. No rubs, gallops or murmurs. Lungs: Clear Abdomen: Soft, nontender, nondistended. No hepatosplenomegaly. No bruits or masses. Good bowel sounds. Extremities: No cyanosis, clubbing, rash, edema Neuro: Alert & orientedx3, cranial nerves grossly intact. moves all 4 extremities w/o difficulty. Affect pleasant  Telemetry   NSR 90s (Personally reviewed)    EKG    No new EKG to review  Labs    CBC Recent Labs    06/03/22 0400  WBC 9.1  HGB 16.9  HCT 51.6  MCV 90.1  PLT 580   Basic Metabolic Panel Recent Labs    06/03/22 0400 06/04/22 0542 06/05/22 0547  NA 134* 136 135  K 3.8 3.9 3.6  CL 97* 98 98  CO2 31 25 26   GLUCOSE 126* 138* 130*  BUN 22* 29* 37*  CREATININE 1.24 1.66* 1.81*  CALCIUM 9.1 9.7 9.5  MG 2.0 2.4  --    Liver Function Tests No results for input(s): "AST", "ALT", "ALKPHOS",  "BILITOT", "PROT", "ALBUMIN" in the last 72 hours. No results for input(s): "LIPASE", "AMYLASE" in the last 72 hours. Cardiac Enzymes No results for input(s): "CKTOTAL", "CKMB", "CKMBINDEX", "TROPONINI" in the last 72 hours.  BNP: BNP (last 3 results) Recent Labs    02/15/22 0342 02/17/22 0611 06/02/22 0322  BNP 495.3* 193.8* 551.9*    ProBNP (last 3 results) No results for input(s): "PROBNP" in the last 8760 hours.   D-Dimer No results for input(s): "DDIMER" in the last 72 hours. Hemoglobin A1C Recent Labs    06/02/22 1239  HGBA1C 7.5*   Fasting Lipid Panel No results for input(s): "CHOL", "HDL", "LDLCALC", "TRIG", "CHOLHDL", "LDLDIRECT" in the last 72 hours. Thyroid Function Tests No results for input(s): "TSH", "T4TOTAL", "T3FREE", "THYROIDAB" in the last 72 hours.  Invalid input(s): "FREET3"  Other results:   Imaging    No results found.   Medications:     Scheduled Medications:  aspirin EC  81 mg Oral Daily   dapagliflozin propanediol  10 mg Oral Daily   docusate sodium  100 mg Oral BID   enoxaparin (LOVENOX) injection  40 mg Subcutaneous Q24H   fenofibrate  54 mg Oral Daily   folic acid  1 mg Oral Daily   insulin aspart  0-15 Units Subcutaneous TID WC   insulin aspart  0-5 Units Subcutaneous  QHS   metoprolol succinate  12.5 mg Oral Daily   multivitamin with minerals  1 tablet Oral Daily   sacubitril-valsartan  1 tablet Oral BID   sodium chloride flush  3 mL Intravenous Q12H   spironolactone  25 mg Oral Daily    Infusions:   PRN Medications: acetaminophen **OR** acetaminophen, bisacodyl, ondansetron **OR** ondansetron (ZOFRAN) IV, polyethylene glycol    Patient Profile   41 y.o. male with history of chronic systolic heart failure with previously recovered EF, uncontrolled HTN, DM II, polysubstance abuse, noncompliance. Admitted with a/c CHF.  Assessment/Plan   Acute on chronic systolic CHF: -EF severely reduced in 2017 in setting of  uncontrolled HTN. EF recovered in 2019. -Echo 02/09/22: EF 30-35%, RV okay, mild to moderate MR. -R/LHC 02/16/22: No significant CAD, RA mean 1, PA mean 19, PCWP 3, LVEDP 7, Fick CO/CI 4.17/1.99 -cMRI 02/20/22: LVEF 29%, LV severely dilated, asymmetric LVH with basal septum measuring 17 mm, RVEF 45%, patchy LGE in setpum and RV insertion sites + subepicardial LGE in inferolateral wall. Scar pattern felt to be consistent with HCM. However, d/t subepicardial LGE in inferolateral wall and presence of mediastinal lymphadenopathy, cardiac PET recommended to r/o sarcoid, small pericardial effusion.  -NYHA IIIb. 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  -Switching Entresto > Losartan 100 daily d/t insurance issues, to start tomorrow -Increase metoprolol to 25 mg daily -Continue Jardiance 10mg  daily -AKI, likely secondary to overdiuresis. Will hold off on further Lasix, euvolemic on exam today. Will likely need PRN diuretic will arrange and reassess at close f/u.   2. NSVT: -Consider ziopatch at discharge if he continues to have a significant burden of NSVT, no SVT last 2 nights.    3. Uncontrolled HTN: -Medication compliance discussed at length.  -Uptitrating medical therapy. Controlled over the last 24 hours   4. Polysubstance abuse: -ETOH and crack/cocaine -Cessation discussed -Consult HF CSW to assist with resources   5. DM II: -A1c 9.9 in June -Not taking any meds PTA -Repeat A1C this admission of 7.5   6. CKD II: - Scr averaging 1.3-1.5 - AKI Cr. 1.24>1.66>1.81, will hold off on further diuresis, hold entresto today.   SDOH: -Uninsured. Not a [place] citizen.  -HF TOC CSW consulted, should be stopping by today -Would be a good candidate for paramedicine if willing to participate.   OK to d/c today/tomorrow from AHF standpoint, close f/u scheduled.  Discussed importance of med compliance, etoh/cocaine cessation, and showing up to f/u appt.   AHF meds at d/c: ASA 81 mg daily Jardiance 10 mg daily Metoprolol 25 mg daily Losartan 100 mg daily Spiro 25 mg daily  Length of Stay: [place], AGACNP-BC  06/05/2022, 8:57 AM  Advanced Heart Failure Team Pager [phone] (M-F; 7a - 5p)  Please contact [facility] for night-coverage after hours (5p -7a ) and weekends on [email or web address]

## 2022-06-05 NOTE — Progress Notes (Addendum)
Occupational Therapy Treatment Patient Details Name: Rion Catala MRN: [id number] DOB: September 04, 1981 Today's Date: 06/05/2022   History of present illness Patient is a 41 yo male presenting to the ED on 06/02/22 with SOB and cough. Patient admitted with CHGF exacerbation and elevated tropinin. PMH includes: hypertension, hyperlipidemia, nonischemic cardiomyopathy, recent ECHO with 35% EF, DM II, mitral regurgitation, ETOH abuse, hx "crack"/cocaine use.   OT comments  Patient seen with in-house spanish interpreter Graciela.  Patient seen to address education for energy conservation and CHF. Patient provided handouts for energy conservation strategies and CHF management. Handouts were reviewed with patient with him demonstrating good understanding and restating key points. Patient is independent with self care and mobility tasks and OT will sign off.    Recommendations for follow up therapy are one component of a multi-disciplinary discharge planning process, led by the attending physician.  Recommendations may be updated based on patient status, additional functional criteria and insurance authorization.    Follow Up Recommendations  No OT follow up    Assistance Recommended at Discharge PRN  Patient can return home with the following  Assist for transportation (initially)   Equipment Recommendations  None recommended by OT    Recommendations for Other Services      Precautions / Restrictions Precautions Precautions: Fall Precaution Comments: watch O2 Restrictions Weight Bearing Restrictions: No       Mobility Bed Mobility Overal bed mobility: Modified Independent                  Transfers Overall transfer level: Independent                       Balance Overall balance assessment: Modified Independent                                         ADL either performed or assessed with clinical judgement   ADL Overall ADL's : At  baseline                                       General ADL Comments: focused on patient education for energy conservation and CHF    Extremity/Trunk Assessment              Vision       Perception     Praxis      Cognition Arousal/Alertness: Awake/alert Behavior During Therapy: WFL for tasks assessed/performed Overall Cognitive Status: Within Functional Limits for tasks assessed                                 General Comments: Patient seen for education for energy conservation and CHF with in-house interpreter Graciela with patient demonstrating good understanding of education        Exercises      Shoulder Instructions       General Comments Patient seen for patient education for energy conservation and CHF    Pertinent Vitals/ Pain       Pain Assessment Pain Assessment: No/denies pain  Home Living  Prior Functioning/Environment              Frequency  Min 2X/week        Progress Toward Goals  OT Goals(current goals can now be found in the care plan section)  Progress towards OT goals: Progressing toward goals  Acute Rehab OT Goals Patient Stated Goal: go home OT Goal Formulation: With patient Time For Goal Achievement: 06/16/22 Potential to Achieve Goals: Good ADL Goals Pt Will Transfer to Toilet: Independently Pt Will Perform Toileting - Clothing Manipulation and hygiene: Independently Additional ADL Goal #1: Patient will be able to verbalize 3 strategies for energy conservation for safe return to independence. Additional ADL Goal #2: Patient will be able to verbalize 3 strategies to manage CHF diagnosis to prevent further hospital admissions.  Plan Discharge plan remains appropriate    Co-evaluation                 AM-PAC OT "6 Clicks" Daily Activity     Outcome Measure   Help from another person eating meals?: None Help from another  person taking care of personal grooming?: None Help from another person toileting, which includes using toliet, bedpan, or urinal?: None Help from another person bathing (including washing, rinsing, drying)?: None Help from another person to put on and taking off regular upper body clothing?: None Help from another person to put on and taking off regular lower body clothing?: None 6 Click Score: 24    End of Session    OT Visit Diagnosis: Unsteadiness on feet (R26.81)   Activity Tolerance Patient tolerated treatment well   Patient Left in bed;with call bell/phone within reach   Nurse Communication Mobility status        Time: 1610-9604 OT Time Calculation (min): 17 min  Charges: OT General Charges $OT Visit: 1 Visit OT Treatments $Therapeutic Activity: 8-22 mins  Lodema Hong, [place]  Office [place] 06/05/2022, 12:31 PM

## 2022-06-05 NOTE — Telephone Encounter (Signed)
Patient Advocate Encounter  Emailed to pharmacist [employer] Patient Assistance Program application for Jardiance 10 mg for this patient who is uninsured.      Lyndel Safe, [place] Patient Advocate Specialist [place] Patient Advocate Team Direct Number: [phone]  Fax: [phone]

## 2022-06-05 NOTE — TOC Initial Note (Addendum)
Transition of Care [facility]) - Initial/Assessment Note    Patient Details  Name: Justin [name] MRN: [id number] Date of Birth: 1980-11-03  [facility]) CM/SW Contact:    Elliot Cousin, RN Phone Number: [phone] 06/05/2022, 10:12 AM  Clinical Narrative:                 Referral sent for outpatient HF Paramedicine follow up.   HF TOC CM spoke to pt with Spanish Interpreter at bedside. Pt states he has transportation to his appts. Pt states he is self-employed and has lived in [place] for 11 years. Currently is not a citizen. Pt has scale at home. Pt has a PCP appt in Primary Care at [facility] 06/29/2022.Will request meds come up from [facility] pharmacy at dc.   Expected Discharge Plan: Home/Self Care Barriers to Discharge: No Barriers Identified   Patient Goals and CMS Choice Patient states their goals for this hospitalization and ongoing recovery are:: wants to stay well      Expected Discharge Plan and Services Expected Discharge Plan: Home/Self Care   Discharge Planning Services: MATCH Program, CM Consult, Follow-up appt scheduled   Living arrangements for the past 2 months: Apartment                                      Prior Living Arrangements/Services Living arrangements for the past 2 months: Apartment Lives with:: Friends Patient language and need for interpreter reviewed:: Yes Do you feel safe going back to the place where you live?: Yes      Need for Family Participation in Patient Care: No (Comment) Care giver support system in place?: No (comment) Current home services: DME (scale) Criminal Activity/Legal Involvement Pertinent to Current Situation/Hospitalization: No - Comment as needed  Activities of Daily Living      Permission Sought/Granted Permission sought to share information with : Case Manager Permission granted to share information with : Yes, Verbal Permission Granted  Share Information with NAME: Elray Buba      Permission granted to share info w Relationship: friend  Permission granted to share info w Contact Information: [phone]  Emotional Assessment Appearance:: Appears stated age Attitude/Demeanor/Rapport: Engaged Affect (typically observed): Accepting Orientation: : Oriented to Self, Oriented to Place, Oriented to  Time, Oriented to Situation Alcohol / Substance Use: Illicit Drugs Psych Involvement: No (comment)  Admission diagnosis:  Acute on chronic systolic heart failure (HCC) [I50.23] Renal insufficiency [N28.9] Elevated troponin [R77.8] Acute CHF (congestive heart failure) (HCC) [I50.9] Acute respiratory failure with hypoxia (HCC) [J96.01] Patient Active Problem List   Diagnosis Date Noted   Class 3 obesity (HCC) 06/04/2022   Acute kidney injury superimposed on chronic kidney disease (HCC) 06/03/2022   Acute on chronic systolic CHF (congestive heart failure) (HCC) 06/02/2022   Type 2 diabetes mellitus with hyperlipidemia (HCC) 06/02/2022   Polysubstance abuse (HCC) 06/02/2022   Ventricular tachycardia (HCC)    Primary hypertension    Cardiomyopathy due to hypertension, with heart failure (HCC)    Acute systolic CHF (congestive heart failure) (HCC)    Nonrheumatic mitral valve regurgitation    Dyspnea    Community acquired pneumonia    Acute hypoxemic respiratory failure (HCC) 02/09/2022   Hypertriglyceridemia 04/18/2018   Nonischemic cardiomyopathy (HCC) 03/29/2018   Chronic diastolic heart failure (HCC) 03/29/2018   Essential hypertension 03/29/2018   PCP:  Patient, No Pcp Per Pharmacy:  [place], [place] - [address] [place] [address] Phone: [phone] Fax: [phone]  [facility] [address] [address] Phone: [phone] Fax: [phone]     Social Determinants of Health (SDOH) Interventions    Readmission Risk Interventions     No data to  display

## 2022-06-05 NOTE — Progress Notes (Signed)
Progress Note   Patient: Justin [name] YIR:[id number] DOB: 17-Dec-1980 DOA: 06/02/2022     3 DOS: the patient was seen and examined on 06/05/2022   Brief hospital course: Mr. Helle was admitted to the hospital with the working diagnosis of heart failure decompensation.   41 yo male with the past medical history of heart failure, chronic hypoxemic respiratory failure and hypertension who presented with dyspnea. Recent hospitalization for heart failure 02/2022. Patient stopped taking his medications about 2 months, on the day of hospitalization he was in a party dancing when suddenly developed severe dyspnea prompting him to come to the hospital. On his initial physical examination his blood pressure was 120/81, HR 77, RR 18 and 02 saturation 96%, lungs with no wheezing or rales, heart with S1 and S2 present and rhythmic, abdomen with no distention and no lower extremity edema.  Na 137, K 4,0 CL 100 bicarbonate 26, glucose 202 bun 22 cr 1,49  BNP 551 High sensitive troponin 143, 165, 146 Wbc 11,4 hgb 16,3 plt 259    Chest radiograph with cardiomegaly, bilateral hilar vascular congestion, bilateral cephalization of the vasculature, bilateral symmetric interstitial infiltrates, predominant at the lower lobes.  EKG 112 bpm, right axis deviation, normal intervals, sinus rhythm with left atrial enlargement, J point elevation V2 to V4, with LVH, no significant T wave changes.   Patient was placed on furosemide for diuresis.  09/24 volume has improved 09/25 prolonged hospitalization due to renal failure.   Assessment and Plan: * Acute on chronic systolic CHF (congestive heart failure) (HCC) Echocardiogram with reduced LV systolic function 15 to 50%, with global hypokinesis. RV with moderate reduction in systolic function, moderate to severe eccentric mitral regurgitation. Left atrium with severe dilatation.   Urine output 0,938 ml Systolic blood pressure 182 to 123 mmHg.   Continue  heart failure management with spironolactone, metoprolol, losartan, and empagliflozin Hold on loop diuretic therapy until renal function more stable.   Acute hypoxemic respiratory failure due to acute cardiogenic pulmonary edema. 02 saturation is 95% on room air.   Essential hypertension Continue blood pressure control with losartan, spironolactone and metoprolol succinate.   Type 2 diabetes mellitus with hyperlipidemia (HCC) Fasting glucose is controlled 130 this am Continue diabetic prudent diet.  Insulin sliding scale for glucose cover and monitoring  Continue with ezetimibe.   Acute kidney injury superimposed on chronic kidney disease (HCC) CKD stage 2, Hyponatremia,   His volume status has improved, renal function today with a serum cr of 1,30 with K at 3,6 and serum bicarbonate at 26, Na 135.   Plan to hold on loop diuretic therapy for now Follow up renal function in am, avoid hypotension and nephrotoxic medications.   Polysubstance abuse ([place]) No signs of alcohol withdrawal, will discontinue benzodiazepines.   Class 3 obesity (HCC) Calculated BMI is 40,6         Subjective: Patient with no chest pain or dyspnea, out of bed   Physical Exam: Vitals:   06/04/22 1840 06/04/22 2015 06/05/22 0538 06/05/22 0855  BP:  111/71 123/89 107/72  Pulse:  93 87   Resp:  18 18 18   Temp:  98 F (36.7 C) (!) 97.5 F (36.4 C)   TempSrc:  Oral Oral   SpO2: 92% 92% 91% 95%  Weight:   111.1 kg   Height:       Neurology awake and alert ENT with no pallor Cardiovascular with S1 and S2 present and rhythmic with no gallops, rubs or murmurs  Respiratory with no rales or wheezing Abdomen with no distention  No lower extremity edema  Data Reviewed:    Family Communication: no family at the bedside   Disposition: Status is: Inpatient Remains inpatient appropriate because: renal failure   Planned Discharge Destination: Home     Author: Tawni Millers,  MD 06/05/2022 12:11 PM  For on call review [email or web address].

## 2022-06-05 NOTE — Plan of Care (Signed)
  Problem: Education: Goal: Ability to demonstrate management of disease process will improve Outcome: Adequate for Discharge   Problem: Clinical Measurements: Goal: Ability to maintain clinical measurements within normal limits will improve Outcome: Adequate for Discharge

## 2022-06-06 ENCOUNTER — Other Ambulatory Visit (HOSPITAL_COMMUNITY): Payer: Self-pay

## 2022-06-06 LAB — BASIC METABOLIC PANEL
Anion gap: 9 (ref 5–15)
BUN: 34 mg/dL — ABNORMAL HIGH (ref 6–20)
CO2: 24 mmol/L (ref 22–32)
Calcium: 9.1 mg/dL (ref 8.9–10.3)
Chloride: 101 mmol/L (ref 98–111)
Creatinine, Ser: 1.76 mg/dL — ABNORMAL HIGH (ref 0.61–1.24)
GFR, Estimated: 49 mL/min — ABNORMAL LOW (ref >60)
Glucose, Bld: 119 mg/dL — ABNORMAL HIGH (ref 70–99)
Potassium: 4 mmol/L (ref 3.5–5.1)
Sodium: 134 mmol/L — ABNORMAL LOW (ref 135–145)

## 2022-06-06 LAB — GLUCOSE, CAPILLARY: Glucose-Capillary: 133 mg/dL — ABNORMAL HIGH (ref 70–99)

## 2022-06-06 MED ORDER — LOSARTAN POTASSIUM 50 MG PO TABS
50.0000 mg | ORAL_TABLET | Freq: Every day | ORAL | 0 refills | Status: DC
  Filled 2022-06-06: qty 30, 30d supply, fill #0

## 2022-06-06 MED ORDER — EMPAGLIFLOZIN 10 MG PO TABS
10.0000 mg | ORAL_TABLET | Freq: Every day | ORAL | 0 refills | Status: DC
  Filled 2022-06-06: qty 30, 30d supply, fill #0

## 2022-06-06 MED ORDER — SPIRONOLACTONE 25 MG PO TABS
25.0000 mg | ORAL_TABLET | Freq: Every day | ORAL | 0 refills | Status: DC
  Filled 2022-06-06: qty 30, 30d supply, fill #0

## 2022-06-06 MED ORDER — LOSARTAN POTASSIUM 50 MG PO TABS: 50.0000 mg | ORAL_TABLET | Freq: Every day | ORAL | Status: DC

## 2022-06-06 MED ORDER — ROSUVASTATIN CALCIUM 10 MG PO TABS
10.0000 mg | ORAL_TABLET | Freq: Every day | ORAL | 0 refills | Status: DC
  Filled 2022-06-06: qty 30, 30d supply, fill #0

## 2022-06-06 MED ORDER — METOPROLOL SUCCINATE ER 25 MG PO TB24
25.0000 mg | ORAL_TABLET | Freq: Every day | ORAL | 0 refills | Status: DC
  Filled 2022-06-06: qty 30, 30d supply, fill #0

## 2022-06-06 MED ORDER — ROSUVASTATIN CALCIUM 5 MG PO TABS
10.0000 mg | ORAL_TABLET | Freq: Every day | ORAL | Status: DC
  Administered 2022-06-06: 10 mg via ORAL
  Filled 2022-06-06: qty 2

## 2022-06-06 NOTE — TOC CM/SW Note (Signed)
HF TOC spoke to [place] and meds are $16 out of pocket. MATCH not needed. Pt states his friend will pick him up at dc. [employer], Heart Failure TOC CM [phone]

## 2022-06-06 NOTE — Progress Notes (Signed)
   06/06/22 1000  Mobility  Activity Ambulated independently in hallway  Level of Assistance Independent  Assistive Device None  Distance Ambulated (ft) 500 ft  Activity Response Tolerated well  $Mobility charge 1 Mobility   Mobility Specialist Progress Note  Received pt in bed having no complaints and agreeable to mobility. Pt was asymptomatic throughout ambulation and returned to room w/o fault. Left in bed w/ call bell in reach and all needs met.   Lucious Groves Mobility Specialist

## 2022-06-06 NOTE — Progress Notes (Signed)
Explained discharge instructions with hospital spanish interpreter. Patient understood little english. Reviewed diabetic and heart failure education. Reviewed follow up appointments and next medication administration times patient verbalized having an understanding. Removed two IV and telemetry monitor. Notified CCMD. All belongings are in the patient's possession. Picking up his TOC meds on route to the discharge lounge. No further need noted .

## 2022-06-06 NOTE — Progress Notes (Addendum)
Advanced Heart Failure Rounding Note  PCP-Cardiologist: Buford Dresser, MD   Subjective:    Feels good today, laying in bed comfortably. Denies CP and SOB.   Objective:   Weight Range: 111.2 kg Body mass index is 40.79 kg/m.   Vital Signs:   Temp:  [97.8 F (36.6 C)-98 F (36.7 C)] 98 F (36.7 C) (09/26 0420) Pulse Rate:  [80-90] 81 (09/26 0420) Resp:  [18-20] 18 (09/26 0420) BP: (107-133)/(72-86) 133/79 (09/26 0420) SpO2:  [90 %-95 %] 90 % (09/26 0420) Weight:  [111.2 kg] 111.2 kg (09/26 0020) Last BM Date : 06/05/22  Weight change: Filed Weights   06/04/22 0004 06/05/22 0538 06/06/22 0020  Weight: 110.8 kg 111.1 kg 111.2 kg    Intake/Output:   Intake/Output Summary (Last 24 hours) at 06/06/2022 0748 Last data filed at 06/06/2022 0644 Gross per 24 hour  Intake 1320 ml  Output 1800 ml  Net -480 ml      Physical Exam    General:  well appearing. Looks stated age. No respiratory difficulty HEENT: normal Neck: supple. JVD ~7 cm. Carotids 2+ bilat; no bruits. No lymphadenopathy or thyromegaly appreciated. Cor: PMI nondisplaced. Regular rate & rhythm. No rubs, gallops or murmurs. Lungs: clear Abdomen: soft, nontender, nondistended. No hepatosplenomegaly. No bruits or masses. Good bowel sounds. Extremities: no cyanosis, clubbing, rash, edema  Neuro: alert & oriented x 3, cranial nerves grossly intact. moves all 4 extremities w/o difficulty. Affect pleasant.   Telemetry   NSR 90s (Personally reviewed)    EKG    No new EKG to review  Labs    CBC No results for input(s): "WBC", "NEUTROABS", "HGB", "HCT", "MCV", "PLT" in the last 72 hours.  Basic Metabolic Panel Recent Labs    06/04/22 0542 06/05/22 0547 06/06/22 0117  NA 136 135 134*  K 3.9 3.6 4.0  CL 98 98 101  CO2 25 26 24   GLUCOSE 138* 130* 119*  BUN 29* 37* 34*  CREATININE 1.66* 1.81* 1.76*  CALCIUM 9.7 9.5 9.1  MG 2.4  --   --    Liver Function Tests No results for input(s):  "AST", "ALT", "ALKPHOS", "BILITOT", "PROT", "ALBUMIN" in the last 72 hours. No results for input(s): "LIPASE", "AMYLASE" in the last 72 hours. Cardiac Enzymes No results for input(s): "CKTOTAL", "CKMB", "CKMBINDEX", "TROPONINI" in the last 72 hours.  BNP: BNP (last 3 results) Recent Labs    02/15/22 0342 02/17/22 0611 06/02/22 0322  BNP 495.3* 193.8* 551.9*    ProBNP (last 3 results) No results for input(s): "PROBNP" in the last 8760 hours.   D-Dimer No results for input(s): "DDIMER" in the last 72 hours. Hemoglobin A1C No results for input(s): "HGBA1C" in the last 72 hours.  Fasting Lipid Panel No results for input(s): "CHOL", "HDL", "LDLCALC", "TRIG", "CHOLHDL", "LDLDIRECT" in the last 72 hours. Thyroid Function Tests No results for input(s): "TSH", "T4TOTAL", "T3FREE", "THYROIDAB" in the last 72 hours.  Invalid input(s): "FREET3"  Other results:   Imaging    No results found.   Medications:     Scheduled Medications:  aspirin EC  81 mg Oral Daily   docusate sodium  100 mg Oral BID   empagliflozin  10 mg Oral Daily   enoxaparin (LOVENOX) injection  40 mg Subcutaneous Q24H   fenofibrate  54 mg Oral Daily   folic acid  1 mg Oral Daily   insulin aspart  0-15 Units Subcutaneous TID WC   insulin aspart  0-5 Units Subcutaneous QHS  losartan  100 mg Oral Daily   metoprolol succinate  25 mg Oral Daily   multivitamin with minerals  1 tablet Oral Daily   sodium chloride flush  3 mL Intravenous Q12H   spironolactone  25 mg Oral Daily    Infusions:   PRN Medications: acetaminophen **OR** acetaminophen, bisacodyl, ondansetron **OR** ondansetron (ZOFRAN) IV, polyethylene glycol    Patient Profile   41 y.o. male with history of chronic systolic heart failure with previously recovered EF, uncontrolled HTN, DM II, polysubstance abuse, noncompliance. Admitted with a/c CHF.  Assessment/Plan   Acute on chronic systolic CHF: -EF severely reduced in 2017 in  setting of uncontrolled HTN. EF recovered in 2019. -Echo 02/09/22: EF 30-35%, RV okay, mild to moderate MR. -R/LHC 02/16/22: No significant CAD, RA mean 1, PA mean 19, PCWP 3, LVEDP 7, Fick CO/CI 4.17/1.99 -cMRI 02/20/22: LVEF 29%, LV severely dilated, asymmetric LVH with basal septum measuring 17 mm, RVEF 45%, patchy LGE in setpum and RV insertion sites + subepicardial LGE in inferolateral wall. Scar pattern felt to be consistent with HCM. However, d/t subepicardial LGE in inferolateral wall and presence of mediastinal lymphadenopathy, cardiac PET recommended to r/o sarcoid, small pericardial effusion.  -NYHA IIIb. 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  -Continue Losartan 100 mg daily -Continue metoprolol to 25 mg daily -Continue Jardiance 10mg  daily -AKI, likely secondary to overdiuresis, now improving.    2. NSVT: -Consider ziopatch at discharge if he continues to have a significant burden of NSVT, no SVT last 3 nights.    3. Uncontrolled HTN: -Medication compliance discussed at length.  -Continue current regimen. Stable   4. Polysubstance abuse: -ETOH and crack/cocaine -Cessation discussed -Consult HF CSW to assist with resources   5. DM II: -A1c 9.9 in June -Not taking any meds PTA -Repeat A1C this admission of 7.5   6. CKD II: - Scr averaging 1.3-1.5 - AKI Cr. 1.24>1.66>1.81>1.76, downtrending, will hold off on further diuresis.   7. Hyperlipidemia - LDL 173, start rosuvastatin  SDOH: -Uninsured. Not a [place] citizen.  -HF TOC CSW consulted, should be stopping by today -Would be a good candidate for paramedicine if willing to participate.   OK to d/c today from AHF standpoint, close f/u scheduled. Discussed importance of med compliance, etoh/cocaine cessation, and showing up to f/u appts.   AHF meds at d/c: ASA 81 mg daily Jardiance 10  mg daily Metoprolol 25 mg daily Losartan 100 mg daily Spiro 25 mg daily Rosuvastatin 10 mg daily  Length of Stay: [address] AGACNP-BC  06/06/2022, 7:48 AM  Advanced Heart Failure Team Pager [phone] (M-F; 7a - 5p)  Please contact [facility] for night-coverage after hours (5p -7a ) and weekends on [email or web address]

## 2022-06-06 NOTE — Discharge Summary (Signed)
Physician Discharge Summary   Patient: Sylus Stgermain MRN: [id number] DOB: August 24, 1981  Admit date:     06/02/2022  Discharge date: 06/06/22  Discharge Physician: Jimmy Picket Ovetta Bazzano   PCP: Patient, No Pcp Per   Recommendations at discharge:    New prescriptions given for his medications and he was advised to be compliant with medical therapy and follow up. Changed to losartan from entresto due to financial constrains Increased spironolactone to 25 mg daily.  Plan to follow up as outpatient renal function in 7 days Loop diuretic to be started as outpatient Patient was instructed to keep fluid restriction 1200 to 1500 ml per day and 2 g salt or less per day.   Discharge Diagnoses: Principal Problem:   Acute on chronic systolic CHF (congestive heart failure) (HCC) Active Problems:   Essential hypertension   Type 2 diabetes mellitus with hyperlipidemia (HCC)   Acute kidney injury superimposed on chronic kidney disease (HCC)   Polysubstance abuse ([place])   Class 3 obesity ([place])  Resolved Problems:   * No resolved hospital problems. [facility] Course: Mr. Pickerill was admitted to the hospital with the working diagnosis of heart failure decompensation.   41 yo male with the past medical history of heart failure and hypertension who presented with dyspnea. Recent hospitalization for heart failure 02/2022. Patient stopped taking his medications for about 2 months, on the day of hospitalization he was in a party dancing when he suddenly developed severe dyspnea prompting him to come to the hospital. On his initial physical examination his blood pressure was 120/81, HR 77, RR 18 and 02 saturation 96%, lungs with no wheezing or rales, heart with S1 and S2 present and rhythmic, abdomen with no distention and no lower extremity edema.  Na 137, K 4,0 CL 100 bicarbonate 26, glucose 202 bun 22 cr 1,49  BNP 551 High sensitive troponin 143, 165, 146 Wbc 11,4 hgb 16,3 plt 259     Chest radiograph with cardiomegaly, bilateral hilar vascular congestion, bilateral cephalization of the vasculature, bilateral symmetric interstitial infiltrates, predominant at the lower lobes.  EKG 112 bpm, right axis deviation, normal intervals, sinus rhythm with left atrial enlargement, J point elevation V2 to V4, with LVH, no significant T wave changes.   Patient was placed on furosemide for diuresis.  09/24 volume has improved 09/25 prolonged hospitalization due to renal failure.  09/26 renal function has improved, patient will follow up with cardiology as outpatient.  For now will continue to hold on loop diuretic until follow up as outpatient.   Assessment and Plan: * Acute on chronic systolic CHF (congestive heart failure) (HCC) Echocardiogram with reduced LV systolic function 15 to 30%, with global hypokinesis. RV with moderate reduction in systolic function, moderate to severe eccentric mitral regurgitation. Left atrium with severe dilatation.   Patient was placed on IV furosemide for diuresis, negative fluid balance was achieved, -8,315 ml, with significant improvement in his symptoms.   Medical management with spironolactone, metoprolol, losartan, and empagliflozin Hold on loop diuretic therapy until follow up as outpatient.    Acute hypoxemic respiratory failure due to acute cardiogenic pulmonary edema. 02 saturation is 95% on room air.   Essential hypertension Continue blood pressure control with losartan, spironolactone and metoprolol succinate.   Type 2 diabetes mellitus with hyperlipidemia (HCC) Glucose remained well controlled during his hospitalization, he was placed on insulin sliding scale for glucose cover and monitoring.  Continue diabetic prudent diet.   Continue with ezetimibe.   Acute kidney  injury superimposed on chronic kidney disease ([place]) CKD stage 2, Hyponatremia,   His renal function has improved, at the time of discharge his serum cr is 1,76  from peak 1,81, his K is 4,0 and serum bicarbonate at 24.   Continue with diuretic therapy with spironolactone and SGLT 2 inh, follow up renal function as outpatient. Holding on loop diuretic until outpatient follow up. (advised to be compliant with outpatient follow up appointments).   Polysubstance abuse ([place]) No signs of alcohol withdrawal, will discontinue benzodiazepines.   Class 3 obesity (HCC) Calculated BMI is 40,6          Consultants: cardiology  Procedures performed: none   Disposition: Home Diet recommendation:  Discharge Diet Orders (From admission, onward)     Start     Ordered   06/06/22 0000  Diet - low sodium heart healthy        06/06/22 0930           Cardiac and Carb modified diet DISCHARGE MEDICATION: Allergies as of 06/06/2022   No Known Allergies      Medication List     STOP taking these medications    carvedilol 25 MG tablet Commonly known as: COREG   Entresto 24-26 MG Generic drug: sacubitril-valsartan   Farxiga 10 MG Tabs tablet Generic drug: dapagliflozin propanediol   fenofibrate 54 MG tablet   glimepiride 2 MG tablet Commonly known as: AMARYL   metFORMIN 500 MG tablet Commonly known as: Glucophage       TAKE these medications    Accu-Chek Guide test strip Generic drug: glucose blood Use to test 4 (four) times daily -  before meals and at bedtime.   Accu-Chek Guide w/Device Kit use to check blood sugars 4 times daily   Accu-Chek Softclix Lancets lancets use to check blood sugar up to four times daily   blood glucose meter kit and supplies Kit Dispense based on patient and insurance preference. Use up to four times daily as directed. (FOR ICD-9 250.00, 250.01). For QAC - HS accuchecks.   empagliflozin 10 MG Tabs tablet Commonly known as: JARDIANCE Take 1 tablet (10 mg total) by mouth daily. Start taking on: June 07, 2022   losartan 50 MG tablet Commonly known as: COZAAR Take 1 tablet (50 mg total) by  mouth daily. Start taking on: June 07, 2022   metoprolol succinate 25 MG 24 hr tablet Commonly known as: TOPROL-XL Take 1 tablet (25 mg total) by mouth daily. Start taking on: June 07, 2022   rosuvastatin 10 MG tablet Commonly known as: CRESTOR Take 1 tablet (10 mg total) by mouth daily. Start taking on: June 07, 2022   spironolactone 25 MG tablet Commonly known as: ALDACTONE Take 1 tablet (25 mg total) by mouth daily. Start taking on: June 07, 2022 What changed: how much to take        Follow-up Information     Buford Dresser, MD Follow up.   Specialty: Cardiology Contact information: [address] [place] [address] [phone]         Constance Haw, MD .   Specialty: Cardiology Contact information: [place] [address] [phone]         Primary Care at [facility]. Go on 06/29/2022.   Specialty: Family Medicine Why: _0 :00pm Contact information: [address], [place]        [place] Follow  up on 06/14/2022.   Specialty: Cardiology Why: Regrese con el Dr. Providence Lanius [employer] a las 9:40 am.  [facility] [place] en la clinical de Advanced Heart Failure.  Traiga sus medicinas con used. Contact information: [address] [phone] [place] [phone]               Discharge Exam: Danley Danker Weights   06/04/22 0004 06/05/22 0538 06/06/22 0020  Weight: 110.8 kg 111.1 kg 111.2 kg   BP (!) 108/94 (BP Location: Right Arm)   Pulse 84   Temp 98.1 F (36.7 C) (Oral)   Resp 20   Ht _0  (1.651 m)   Wt 111.2 kg   SpO2 93%   BMI 40.79 kg/m   Patient is feeling better with no dyspnea or edema  Neurology awake and alert ENT with no pallor Cardiovascular with S1 and S2 present and rhythmic with no gallops or  rubs, positive systolic murmur at the apex Respiratory with no rales or wheezing Abdomen with no distention  No lower extremity edema   Condition at discharge: stable  The results of significant diagnostics from this hospitalization (including imaging, microbiology, ancillary and laboratory) are listed below for reference.   Imaging Studies: ECHOCARDIOGRAM COMPLETE  Result Date: 06/02/2022    ECHOCARDIOGRAM REPORT   Patient Name:   NYSIR FERGUSSON Date of Exam: 06/02/2022 Medical Rec #:  [id number]              Height:       65.0 in Accession #:    [id number]             Weight:       218.0 lb Date of Birth:  12-26-1980              BSA:          2.052 m Patient Age:    60 years               BP:           133/84 mmHg Patient Gender: M                      HR:           80 bpm. Exam Location:  Inpatient Procedure: 2D Echo, Cardiac Doppler and Color Doppler Indications:    CHF  History:        Patient has prior history of Echocardiogram examinations, most                 recent 02/09/2022. CHF, Arrythmias:Tachycardia,                 Signs/Symptoms:Shortness of Breath and Dyspnea; Risk                 Factors:Diabetes and Dyslipidemia.  Sonographer:    Greer Pickerel Sonographer#2:  Melissa Morford RDCS (AE, PE) Referring Phys: [id number] JENNIFER YATES  Sonographer Comments: Technically challenging study due to limited acoustic windows. Image acquisition challenging due to respiratory motion. IMPRESSIONS 1. There is severe concentric hypertrophy of the left ventricle. LV systolic function is severely reduced with an estimated EF of 15%-20% and global hypokinesis. Grade III diastolic dysfuncton with echocardiographic signs of elevated filling pressures. 2. The right ventricle is mildly dilated with moderately reduced systolic function. 3. There is at least moderate eccentric mitral regurgitation. The MR jet is likely underestimated due to its eccentricity. 4. The left  atrium is severely enlarged. FINDINGS   Left Ventricle: Left ventricular ejection fraction, by estimation, is <20%. The left ventricle has severely decreased function. The left ventricle has no regional wall motion abnormalities. The left ventricular internal cavity size was normal in size. There is severe concentric left ventricular hypertrophy. Left ventricular diastolic parameters are consistent with Grade III diastolic dysfunction (restrictive). Right Ventricle: The right ventricular size is mildly enlarged. No increase in right ventricular wall thickness. Right ventricular systolic function is moderately reduced. Left Atrium: Left atrial size was severely dilated. Right Atrium: Right atrial size was normal in size. Pericardium: There is no evidence of pericardial effusion. Mitral Valve: The mitral valve is normal in structure. Moderate to severe mitral valve regurgitation. No evidence of mitral valve stenosis. Tricuspid Valve: The tricuspid valve is normal in structure. Tricuspid valve regurgitation is trivial. No evidence of tricuspid stenosis. Aortic Valve: The aortic valve is normal in structure. Aortic valve regurgitation is not visualized. No aortic stenosis is present. Pulmonic Valve: The pulmonic valve was normal in structure. Pulmonic valve regurgitation is trivial. No evidence of pulmonic stenosis. Aorta: The aortic root is normal in size and structure. Venous: The inferior vena cava is normal in size with greater than 50% respiratory variability, suggesting right atrial pressure of 3 mmHg. IAS/Shunts: No atrial level shunt detected by color flow Doppler.  LEFT VENTRICLE PLAX 2D LVIDd:         5.50 cm      Diastology LVIDs:         4.60 cm      LV e' medial:    4.68 cm/s LV PW:         2.00 cm      LV E/e' medial:  24.4 LV IVS:        2.00 cm      LV e' lateral:   5.00 cm/s LVOT diam:     2.20 cm      LV E/e' lateral: 22.8 LVOT Area:     3.80 cm  LV Volumes (MOD) LV vol d, MOD A2C: 161.0 ml LV vol d, MOD A4C: 181.0 ml LV vol s, MOD A2C:  117.0 ml LV vol s, MOD A4C: 138.0 ml LV SV MOD A2C:     44.0 ml LV SV MOD A4C:     181.0 ml LV SV MOD BP:      47.5 ml RIGHT VENTRICLE RV S prime:     9.24 cm/s TAPSE (M-mode): 1.6 cm LEFT ATRIUM              Index        RIGHT ATRIUM           Index LA diam:        5.40 cm  2.63 cm/m   RA Area:     30.70 cm LA Vol (A2C):   168.0 ml 81.87 ml/m  RA Volume:   111.00 ml 54.10 ml/m LA Vol (A4C):   188.0 ml 91.62 ml/m LA Biplane Vol: 179.0 ml 87.24 ml/m   AORTA Ao Root diam: 3.40 cm Ao Asc diam:  3.70 cm MITRAL VALVE MV Area (PHT): 7.09 cm       SHUNTS MV Decel Time: 107 msec       Systemic Diam: 2.20 cm MR Peak grad:    116.2 mmHg MR Mean grad:    80.0 mmHg MR Vmax:         539.00 cm/s MR Vmean:  421.0 cm/s MR PISA:         5.09 cm MR PISA Eff ROA: 36 mm MR PISA Radius:  0.90 cm MV E velocity: 114.00 cm/s MV A velocity: 45.60 cm/s MV E/A ratio:  2.50 Aditya Sabharwal Electronically signed by Hebert Soho Signature Date/Time: 06/02/2022/7:26:05 PM    Final    DG Chest Port 1 View  Result Date: 06/02/2022 CLINICAL DATA:  Shortness of breath EXAM: PORTABLE CHEST 1 VIEW COMPARISON:  02/16/2022 FINDINGS: Diffuse airspace opacity of the chest. There is recent diagnosis of pneumonia and some consideration in the chart of sarcoid. Cardiomegaly and vascular pedicle widening. No visible effusion or pneumothorax. Artifact from EKG leads. IMPRESSION: 1. Bilateral airspace disease which is largely chronic based on prior studies. 2. Chronic cardiomegaly and vascular pedicle widening. 3. The above could easily obscure acute pneumonia or failure. Electronically Signed   By: Jorje Guild M.D.   On: 06/02/2022 04:16    Microbiology: Results for orders placed or performed during the hospital encounter of 02/09/22  Blood Culture (routine x 2)     Status: None   Collection Time: 02/09/22  4:15 AM   Specimen: BLOOD RIGHT ARM  Result Value Ref Range Status   Specimen Description BLOOD RIGHT ARM  Final    Special Requests   Final    BOTTLES DRAWN AEROBIC AND ANAEROBIC Blood Culture adequate volume   Culture   Final    NO GROWTH 5 DAYS Performed at [facility], [place] [address], [place], [place] [address]    Report Status 02/14/2022 FINAL  Final  Blood Culture (routine x 2)     Status: None   Collection Time: 02/09/22  4:16 AM   Specimen: BLOOD LEFT ARM  Result Value Ref Range Status   Specimen Description BLOOD LEFT ARM  Final   Special Requests   Final    AEROBIC BOTTLE ONLY Blood Culture results may not be optimal due to an excessive volume of blood received in culture bottles   Culture   Final    NO GROWTH 5 DAYS Performed at [facility], [place] [address], [place], [place] [address]    Report Status 02/14/2022 FINAL  Final  Respiratory (~20 pathogens) panel by PCR     Status: Abnormal   Collection Time: 02/09/22  6:00 AM   Specimen: Nasopharyngeal Swab; Respiratory  Result Value Ref Range Status   Adenovirus NOT DETECTED NOT DETECTED Final   Coronavirus 229E NOT DETECTED NOT DETECTED Final    Comment: (NOTE) The Coronavirus on the Respiratory Panel, DOES NOT test for the novel  Coronavirus (2019 nCoV)    Coronavirus HKU1 NOT DETECTED NOT DETECTED Final   Coronavirus NL63 NOT DETECTED NOT DETECTED Final   Coronavirus OC43 DETECTED (A) NOT DETECTED Final   Metapneumovirus NOT DETECTED NOT DETECTED Final   Rhinovirus / Enterovirus NOT DETECTED NOT DETECTED Final   Influenza A NOT DETECTED NOT DETECTED Final   Influenza B NOT DETECTED NOT DETECTED Final   Parainfluenza Virus 1 NOT DETECTED NOT DETECTED Final   Parainfluenza Virus 2 NOT DETECTED NOT DETECTED Final   Parainfluenza Virus 3 NOT DETECTED NOT DETECTED Final   Parainfluenza Virus 4 NOT DETECTED NOT DETECTED Final   Respiratory Syncytial Virus NOT DETECTED NOT DETECTED Final   Bordetella pertussis NOT DETECTED NOT DETECTED Final   Bordetella Parapertussis NOT DETECTED NOT DETECTED Final   Chlamydophila  pneumoniae NOT DETECTED NOT DETECTED Final   Mycoplasma pneumoniae NOT DETECTED NOT  DETECTED Final    Comment: Performed at [facility], [place] [address], [place], [place] [address]  Resp Panel by RT-PCR (Flu A&B, Covid) Anterior Nasal Swab     Status: None   Collection Time: 02/09/22  6:00 AM   Specimen: Anterior Nasal Swab  Result Value Ref Range Status   SARS Coronavirus 2 by RT PCR NEGATIVE NEGATIVE Final    Comment: (NOTE) SARS-CoV-2 target nucleic acids are NOT DETECTED.  The SARS-CoV-2 RNA is generally detectable in upper respiratory specimens during the acute phase of infection. The lowest concentration of SARS-CoV-2 viral copies this assay can detect is 138 copies/mL. A negative result does not preclude SARS-Cov-2 infection and should not be used as the sole basis for treatment or other patient management decisions. A negative result may occur with  improper specimen collection/handling, submission of specimen other than nasopharyngeal swab, presence of viral mutation(s) within the areas targeted by this assay, and inadequate number of viral copies(<138 copies/mL). A negative result must be combined with clinical observations, patient history, and epidemiological information. The expected result is Negative.  Fact Sheet for Patients:  EntrepreneurPulse.com.au  Fact Sheet for Healthcare Providers:  IncredibleEmployment.be  This test is no t yet approved or cleared by the [place] FDA and  has been authorized for detection and/or diagnosis of SARS-CoV-2 by FDA under an Emergency Use Authorization (EUA). This EUA will remain  in effect (meaning this test can be used) for the duration of the COVID-19 declaration under Section 564(b)(1) of the Act, 21 U.S.C.section 360bbb-3(b)(1), unless the authorization is terminated  or revoked sooner.       Influenza A by PCR NEGATIVE NEGATIVE Final   Influenza B by PCR NEGATIVE NEGATIVE  Final    Comment: (NOTE) The Xpert Xpress SARS-CoV-2/FLU/RSV plus assay is intended as an aid in the diagnosis of influenza from Nasopharyngeal swab specimens and should not be used as a sole basis for treatment. Nasal washings and aspirates are unacceptable for Xpert Xpress SARS-CoV-2/FLU/RSV testing.  Fact Sheet for Patients: EntrepreneurPulse.com.au  Fact Sheet for Healthcare Providers: IncredibleEmployment.be  This test is not yet approved or cleared by the [place] FDA and has been authorized for detection and/or diagnosis of SARS-CoV-2 by FDA under an Emergency Use Authorization (EUA). This EUA will remain in effect (meaning this test can be used) for the duration of the COVID-19 declaration under Section 564(b)(1) of the Act, 21 U.S.C. section 360bbb-3(b)(1), unless the authorization is terminated or revoked.  Performed at [facility], [place] [address] [place], [place] [address]   MRSA Next Gen by PCR, Nasal     Status: None   Collection Time: 02/09/22  8:30 AM   Specimen: Nasal Mucosa; Nasal Swab  Result Value Ref Range Status   MRSA by PCR Next Gen NOT DETECTED NOT DETECTED Final    Comment: (NOTE) The GeneXpert MRSA Assay (FDA approved for NASAL specimens only), is one component of a comprehensive MRSA colonization surveillance program. It is not intended to diagnose MRSA infection nor to guide or monitor treatment for MRSA infections. Test performance is not FDA approved in patients less than 41 years old. Performed at [facility], [place] [address], [place], [place] [address]     Labs: CBC: Recent Labs  Lab 06/02/22 0322 06/03/22 0400  WBC 11.4* 9.1  NEUTROABS 9.0*  --   HGB 16.3 16.9  HCT 48.4 51.6  MCV 90.0 90.1  PLT 259 353   Basic Metabolic Panel:  Recent Labs  Lab 06/02/22 0322 06/03/22 0400 06/04/22 0542 06/05/22 0547 06/06/22 0117  NA 137 134* 136 135 134*  K 4.0 3.8 3.9 3.6 4.0  CL 100  97* 98 98 101  CO2 _0 GLUCOSE 202* 126* 138* 130* 119*  BUN 22* 22* 29* 37* 34*  CREATININE 1.49* 1.24 1.66* 1.81* 1.76*  CALCIUM 9.6 9.1 9.7 9.5 9.1  MG  --  2.0 2.4  --   --    Liver Function Tests: Recent Labs  Lab 06/02/22 0322  AST 31  ALT 17  ALKPHOS 61  BILITOT 0.9  PROT 6.3*  ALBUMIN 3.1*   CBG: Recent Labs  Lab 06/05/22 0625 06/05/22 1131 06/05/22 1537 06/05/22 2040 06/06/22 0613  GLUCAP 153* 127* 143* 163* 133*    Discharge time spent: greater than 30 minutes.  Signed: Tawni Millers, MD [facility] 06/06/2022

## 2022-06-06 NOTE — Plan of Care (Signed)
  Problem: Education: Goal: Ability to demonstrate management of disease process will improve Outcome: Adequate for Discharge Goal: Ability to verbalize understanding of medication therapies will improve Outcome: Adequate for Discharge Goal: Individualized Educational Video(s) Outcome: Adequate for Discharge   Problem: Cardiac: Goal: Ability to achieve and maintain adequate cardiopulmonary perfusion will improve Outcome: Adequate for Discharge   Problem: Education: Goal: Knowledge of General Education information will improve Description: Including pain rating scale, medication(s)/side effects and non-pharmacologic comfort measures Outcome: Adequate for Discharge   Problem: Health Behavior/Discharge Planning: Goal: Ability to manage health-related needs will improve Outcome: Adequate for Discharge   Problem: Clinical Measurements: Goal: Ability to maintain clinical measurements within normal limits will improve Outcome: Adequate for Discharge Goal: Will remain free from infection Outcome: Adequate for Discharge Goal: Diagnostic test results will improve Outcome: Adequate for Discharge Goal: Respiratory complications will improve Outcome: Adequate for Discharge Goal: Cardiovascular complication will be avoided Outcome: Adequate for Discharge   Problem: Activity: Goal: Risk for activity intolerance will decrease Outcome: Adequate for Discharge   Problem: Nutrition: Goal: Adequate nutrition will be maintained Outcome: Adequate for Discharge   Problem: Coping: Goal: Level of anxiety will decrease Outcome: Adequate for Discharge   Problem: Elimination: Goal: Will not experience complications related to bowel motility Outcome: Adequate for Discharge Goal: Will not experience complications related to urinary retention Outcome: Adequate for Discharge   Problem: Pain Managment: Goal: General experience of comfort will improve Outcome: Adequate for Discharge   Problem:  Safety: Goal: Ability to remain free from injury will improve Outcome: Adequate for Discharge   Problem: Skin Integrity: Goal: Risk for impaired skin integrity will decrease Outcome: Adequate for Discharge   Problem: Education: Goal: Ability to describe self-care measures that may prevent or decrease complications (Diabetes Survival Skills Education) will improve Outcome: Adequate for Discharge Goal: Individualized Educational Video(s) Outcome: Adequate for Discharge   Problem: Coping: Goal: Ability to adjust to condition or change in health will improve Outcome: Adequate for Discharge   Problem: Fluid Volume: Goal: Ability to maintain a balanced intake and output will improve Outcome: Adequate for Discharge   Problem: Fluid Volume: Goal: Ability to maintain a balanced intake and output will improve Outcome: Adequate for Discharge   Problem: Health Behavior/Discharge Planning: Goal: Ability to identify and utilize available resources and services will improve Outcome: Adequate for Discharge Goal: Ability to manage health-related needs will improve Outcome: Adequate for Discharge   Problem: Metabolic: Goal: Ability to maintain appropriate glucose levels will improve Outcome: Adequate for Discharge   Problem: Nutritional: Goal: Maintenance of adequate nutrition will improve Outcome: Adequate for Discharge Goal: Progress toward achieving an optimal weight will improve Outcome: Adequate for Discharge   Problem: Skin Integrity: Goal: Risk for impaired skin integrity will decrease Outcome: Adequate for Discharge   Problem: Tissue Perfusion: Goal: Adequacy of tissue perfusion will improve Outcome: Adequate for Discharge

## 2022-06-09 ENCOUNTER — Telehealth (HOSPITAL_COMMUNITY): Payer: Self-pay | Admitting: Pharmacy Technician

## 2022-06-09 NOTE — Telephone Encounter (Signed)
Advanced Heart Failure Patient Advocate Encounter  Patient is currently uninsured. Sent [employer] application for Jardiance assistance 09/28. Document scanned to chart.

## 2022-06-12 NOTE — Telephone Encounter (Signed)
Advanced Heart Failure Patient Advocate Encounter   Patient was approved to receive Jardiance from [employer]  Effective dates: 06/09/22 through 06/09/23  Document scanned to chart.   Harveen Flesch F Merrillyn Ackerley, CPhT   

## 2022-06-14 ENCOUNTER — Encounter (HOSPITAL_COMMUNITY): Payer: Self-pay | Admitting: Cardiology

## 2022-06-14 ENCOUNTER — Telehealth (HOSPITAL_COMMUNITY): Payer: Self-pay | Admitting: Licensed Clinical Social Worker

## 2022-06-14 NOTE — Telephone Encounter (Signed)
CSW had planned to meet with pt during appt this morning to complete enrollment in [employer] but pt did not come to appt.  CSW texted pt using translate app to request he inform me when he is available to speak so I could call him with an interpreter- awaiting response  Jorge Ny, [place] Worker [facility] Desk#: [phone] Cell#: [phone]

## 2022-06-29 ENCOUNTER — Telehealth (HOSPITAL_COMMUNITY): Payer: Self-pay | Admitting: Licensed Clinical Social Worker

## 2022-06-29 ENCOUNTER — Inpatient Hospital Stay: Payer: Self-pay | Admitting: Family Medicine

## 2022-06-29 NOTE — Telephone Encounter (Signed)
Pt has not responded to attempts to reach at this time.  CSW saw pt had PCP appt today and planned to reach out to that provider to help establish contact but pt did not show.  Will remove pt from paramedicine waitlist at this time and will reconsider for paramedicine if he re-establishes with clinic.  Jorge Ny, LCSW Clinical Social Worker [facility] Desk#: [phone] Cell#: [phone]

## 2022-07-05 ENCOUNTER — Institutional Professional Consult (permissible substitution): Payer: Self-pay | Admitting: Internal Medicine

## 2022-07-05 DIAGNOSIS — I428 Other cardiomyopathies: Secondary | ICD-10-CM

## 2022-07-25 ENCOUNTER — Encounter: Payer: Self-pay | Admitting: Internal Medicine

## 2022-09-21 ENCOUNTER — Emergency Department (HOSPITAL_COMMUNITY): Payer: Self-pay

## 2022-09-21 ENCOUNTER — Encounter (HOSPITAL_COMMUNITY): Payer: Self-pay | Admitting: Emergency Medicine

## 2022-09-21 ENCOUNTER — Other Ambulatory Visit: Payer: Self-pay

## 2022-09-21 ENCOUNTER — Inpatient Hospital Stay (HOSPITAL_COMMUNITY)
Admission: EM | Admit: 2022-09-21 | Discharge: 2022-09-26 | DRG: 291 | Disposition: A | Discharge status: Home / Self Care | Payer: Self-pay | Attending: Internal Medicine | Admitting: Internal Medicine

## 2022-09-21 DIAGNOSIS — J9601 Acute respiratory failure with hypoxia: Secondary | ICD-10-CM

## 2022-09-21 DIAGNOSIS — I160 Hypertensive urgency: Secondary | ICD-10-CM | POA: Diagnosis present

## 2022-09-21 DIAGNOSIS — I10 Essential (primary) hypertension: Secondary | ICD-10-CM

## 2022-09-21 DIAGNOSIS — Z597 Insufficient social insurance and welfare support: Secondary | ICD-10-CM

## 2022-09-21 DIAGNOSIS — E66813 Obesity, class 3: Secondary | ICD-10-CM | POA: Diagnosis present

## 2022-09-21 DIAGNOSIS — I5043 Acute on chronic combined systolic (congestive) and diastolic (congestive) heart failure: Secondary | ICD-10-CM

## 2022-09-21 DIAGNOSIS — I5023 Acute on chronic systolic (congestive) heart failure: Secondary | ICD-10-CM | POA: Diagnosis present

## 2022-09-21 DIAGNOSIS — I493 Ventricular premature depolarization: Secondary | ICD-10-CM | POA: Diagnosis present

## 2022-09-21 DIAGNOSIS — F1490 Cocaine use, unspecified, uncomplicated: Secondary | ICD-10-CM | POA: Diagnosis present

## 2022-09-21 DIAGNOSIS — Z91148 Patient's other noncompliance with medication regimen for other reason: Secondary | ICD-10-CM

## 2022-09-21 DIAGNOSIS — R0602 Shortness of breath: Secondary | ICD-10-CM

## 2022-09-21 DIAGNOSIS — Z8249 Family history of ischemic heart disease and other diseases of the circulatory system: Secondary | ICD-10-CM

## 2022-09-21 DIAGNOSIS — I130 Hypertensive heart and chronic kidney disease with heart failure and stage 1 through stage 4 chronic kidney disease, or unspecified chronic kidney disease: Principal | ICD-10-CM | POA: Diagnosis present

## 2022-09-21 DIAGNOSIS — I340 Nonrheumatic mitral (valve) insufficiency: Secondary | ICD-10-CM | POA: Diagnosis present

## 2022-09-21 DIAGNOSIS — E785 Hyperlipidemia, unspecified: Secondary | ICD-10-CM

## 2022-09-21 DIAGNOSIS — I428 Other cardiomyopathies: Secondary | ICD-10-CM | POA: Diagnosis present

## 2022-09-21 DIAGNOSIS — N182 Chronic kidney disease, stage 2 (mild): Secondary | ICD-10-CM

## 2022-09-21 DIAGNOSIS — Z6841 Body Mass Index (BMI) 40.0 and over, adult: Secondary | ICD-10-CM

## 2022-09-21 DIAGNOSIS — E1122 Type 2 diabetes mellitus with diabetic chronic kidney disease: Secondary | ICD-10-CM | POA: Diagnosis present

## 2022-09-21 DIAGNOSIS — I5022 Chronic systolic (congestive) heart failure: Secondary | ICD-10-CM | POA: Diagnosis present

## 2022-09-21 DIAGNOSIS — N1831 Chronic kidney disease, stage 3a: Secondary | ICD-10-CM | POA: Diagnosis present

## 2022-09-21 DIAGNOSIS — F1010 Alcohol abuse, uncomplicated: Secondary | ICD-10-CM

## 2022-09-21 DIAGNOSIS — Z91199 Patient's noncompliance with other medical treatment and regimen due to unspecified reason: Secondary | ICD-10-CM

## 2022-09-21 DIAGNOSIS — I509 Heart failure, unspecified: Principal | ICD-10-CM

## 2022-09-21 DIAGNOSIS — N1832 Chronic kidney disease, stage 3b: Secondary | ICD-10-CM | POA: Diagnosis present

## 2022-09-21 DIAGNOSIS — I426 Alcoholic cardiomyopathy: Secondary | ICD-10-CM | POA: Diagnosis present

## 2022-09-21 DIAGNOSIS — Z79899 Other long term (current) drug therapy: Secondary | ICD-10-CM

## 2022-09-21 DIAGNOSIS — Z23 Encounter for immunization: Secondary | ICD-10-CM

## 2022-09-21 DIAGNOSIS — Z7984 Long term (current) use of oral hypoglycemic drugs: Secondary | ICD-10-CM

## 2022-09-21 DIAGNOSIS — I480 Paroxysmal atrial fibrillation: Secondary | ICD-10-CM | POA: Diagnosis present

## 2022-09-21 DIAGNOSIS — E1169 Type 2 diabetes mellitus with other specified complication: Secondary | ICD-10-CM

## 2022-09-21 LAB — CBC WITH DIFFERENTIAL/PLATELET
Abs Immature Granulocytes: 0.03 10*3/uL (ref 0.00–0.07)
Basophils Absolute: 0.1 10*3/uL (ref 0.0–0.1)
Basophils Relative: 1 %
Eosinophils Absolute: 0.2 10*3/uL (ref 0.0–0.5)
Eosinophils Relative: 3 %
HCT: 53 % — ABNORMAL HIGH (ref 39.0–52.0)
Hemoglobin: 16.3 g/dL (ref 13.0–17.0)
Immature Granulocytes: 0 %
Lymphocytes Relative: 13 %
Lymphs Abs: 1.2 10*3/uL (ref 0.7–4.0)
MCH: 27.7 pg (ref 26.0–34.0)
MCHC: 30.8 g/dL (ref 30.0–36.0)
MCV: 90.1 fL (ref 80.0–100.0)
Monocytes Absolute: 0.8 10*3/uL (ref 0.1–1.0)
Monocytes Relative: 9 %
Neutro Abs: 6.9 10*3/uL (ref 1.7–7.7)
Neutrophils Relative %: 74 %
Platelets: 327 10*3/uL (ref 150–400)
RBC: 5.88 MIL/uL — ABNORMAL HIGH (ref 4.22–5.81)
RDW: 13.8 % (ref 11.5–15.5)
WBC: 9.3 10*3/uL (ref 4.0–10.5)
nRBC: 0 % (ref 0.0–0.2)

## 2022-09-21 LAB — BASIC METABOLIC PANEL
Anion gap: 9 (ref 5–15)
BUN: 23 mg/dL — ABNORMAL HIGH (ref 6–20)
CO2: 28 mmol/L (ref 22–32)
Calcium: 9 mg/dL (ref 8.9–10.3)
Chloride: 99 mmol/L (ref 98–111)
Creatinine, Ser: 1.51 mg/dL — ABNORMAL HIGH (ref 0.61–1.24)
GFR, Estimated: 59 mL/min — ABNORMAL LOW (ref >60)
Glucose, Bld: 170 mg/dL — ABNORMAL HIGH (ref 70–99)
Potassium: 4 mmol/L (ref 3.5–5.1)
Sodium: 136 mmol/L (ref 135–145)

## 2022-09-21 LAB — TROPONIN I (HIGH SENSITIVITY)
Troponin I (High Sensitivity): 73 ng/L — ABNORMAL HIGH (ref <18)
Troponin I (High Sensitivity): 77 ng/L — ABNORMAL HIGH (ref <18)

## 2022-09-21 LAB — BRAIN NATRIURETIC PEPTIDE: B Natriuretic Peptide: 833.3 pg/mL — ABNORMAL HIGH (ref 0.0–100.0)

## 2022-09-21 MED ORDER — THIAMINE MONONITRATE 100 MG PO TABS
100.0000 mg | ORAL_TABLET | Freq: Every day | ORAL | Status: DC
  Administered 2022-09-21 – 2022-09-22 (×2): 100 mg via ORAL
  Filled 2022-09-21 (×2): qty 1

## 2022-09-21 MED ORDER — HYDRALAZINE HCL 20 MG/ML IJ SOLN
20.0000 mg | Freq: Once | INTRAMUSCULAR | Status: AC
  Administered 2022-09-21: 20 mg via INTRAVENOUS
  Filled 2022-09-21: qty 1

## 2022-09-21 MED ORDER — TRAMADOL HCL 50 MG PO TABS
50.0000 mg | ORAL_TABLET | Freq: Once | ORAL | Status: AC
  Administered 2022-09-21: 50 mg via ORAL
  Filled 2022-09-21: qty 1

## 2022-09-21 MED ORDER — ACETAMINOPHEN 325 MG PO TABS
650.0000 mg | ORAL_TABLET | Freq: Four times a day (QID) | ORAL | Status: DC | PRN
  Administered 2022-09-22: 650 mg via ORAL
  Filled 2022-09-21: qty 2

## 2022-09-21 MED ORDER — ENOXAPARIN SODIUM 40 MG/0.4ML IJ SOSY
40.0000 mg | PREFILLED_SYRINGE | INTRAMUSCULAR | Status: DC
  Administered 2022-09-21 – 2022-09-22 (×2): 40 mg via SUBCUTANEOUS
  Filled 2022-09-21 (×2): qty 0.4

## 2022-09-21 MED ORDER — INSULIN ASPART 100 UNIT/ML IJ SOLN
0.0000 [IU] | Freq: Three times a day (TID) | INTRAMUSCULAR | Status: DC
  Administered 2022-09-22: 3 [IU] via SUBCUTANEOUS
  Administered 2022-09-22 (×2): 1 [IU] via SUBCUTANEOUS
  Administered 2022-09-23: 2 [IU] via SUBCUTANEOUS

## 2022-09-21 MED ORDER — ADULT MULTIVITAMIN W/MINERALS CH
1.0000 | ORAL_TABLET | Freq: Every day | ORAL | Status: DC
  Administered 2022-09-21 – 2022-09-26 (×6): 1 via ORAL
  Filled 2022-09-21 (×6): qty 1

## 2022-09-21 MED ORDER — SPIRONOLACTONE 25 MG PO TABS
25.0000 mg | ORAL_TABLET | Freq: Every day | ORAL | Status: DC
  Administered 2022-09-21 – 2022-09-24 (×4): 25 mg via ORAL
  Filled 2022-09-21 (×4): qty 1

## 2022-09-21 MED ORDER — THIAMINE HCL 100 MG/ML IJ SOLN: 100.0000 mg | Freq: Every day | INTRAMUSCULAR | Status: DC

## 2022-09-21 MED ORDER — LOSARTAN POTASSIUM 50 MG PO TABS
50.0000 mg | ORAL_TABLET | Freq: Once | ORAL | Status: AC
  Administered 2022-09-21: 50 mg via ORAL
  Filled 2022-09-21: qty 1

## 2022-09-21 MED ORDER — HYDRALAZINE HCL 20 MG/ML IJ SOLN: 10.0000 mg | Freq: Four times a day (QID) | INTRAMUSCULAR | Status: DC | PRN

## 2022-09-21 MED ORDER — METOPROLOL TARTRATE 25 MG PO TABS
25.0000 mg | ORAL_TABLET | Freq: Once | ORAL | Status: AC
  Administered 2022-09-21: 25 mg via ORAL
  Filled 2022-09-21: qty 1

## 2022-09-21 MED ORDER — FUROSEMIDE 10 MG/ML IJ SOLN
40.0000 mg | Freq: Every day | INTRAMUSCULAR | Status: DC
  Administered 2022-09-22: 40 mg via INTRAVENOUS
  Filled 2022-09-21: qty 4

## 2022-09-21 MED ORDER — HYDRALAZINE HCL 20 MG/ML IJ SOLN: 10.0000 mg | Freq: Once | INTRAMUSCULAR | Status: DC

## 2022-09-21 MED ORDER — LOSARTAN POTASSIUM 50 MG PO TABS: 50.0000 mg | ORAL_TABLET | Freq: Every day | ORAL | Status: DC

## 2022-09-21 MED ORDER — ROSUVASTATIN CALCIUM 5 MG PO TABS
10.0000 mg | ORAL_TABLET | Freq: Every day | ORAL | Status: DC
  Administered 2022-09-22 – 2022-09-26 (×5): 10 mg via ORAL
  Filled 2022-09-21 (×5): qty 2

## 2022-09-21 MED ORDER — FOLIC ACID 1 MG PO TABS
1.0000 mg | ORAL_TABLET | Freq: Every day | ORAL | Status: DC
  Administered 2022-09-21 – 2022-09-23 (×3): 1 mg via ORAL
  Filled 2022-09-21 (×3): qty 1

## 2022-09-21 MED ORDER — SPIRONOLACTONE 12.5 MG HALF TABLET: 25.0000 mg | ORAL_TABLET | Freq: Every day | ORAL | Status: DC

## 2022-09-21 MED ORDER — LORAZEPAM 1 MG PO TABS: 1.0000 mg | ORAL_TABLET | ORAL | Status: DC | PRN

## 2022-09-21 MED ORDER — FUROSEMIDE 10 MG/ML IJ SOLN
40.0000 mg | Freq: Once | INTRAMUSCULAR | Status: AC
  Administered 2022-09-21: 40 mg via INTRAVENOUS
  Filled 2022-09-21: qty 4

## 2022-09-21 NOTE — Assessment & Plan Note (Signed)
-  secondary to cardiogenic pulmonary edema requiring 2L via [place] -continue to wean as tolerated with IV diuresis -Goal oxygen saturation of greater than 92%

## 2022-09-21 NOTE — ED Triage Notes (Addendum)
Translator/pt Im haing swelling, and decrease in oxgen meaning SOB. This started a week ago.  Pt. Has not had his BP medicine in a week just have not picked it up.

## 2022-09-21 NOTE — ED Notes (Signed)
Interpreter used for medication administration and needs. #[id number]

## 2022-09-21 NOTE — H&P (Signed)
History and Physical    Patient: Justin [name] EPP:[id number] DOB: February 29, 1981 DOA: 09/21/2022 DOS: the patient was seen and examined on 09/21/2022 PCP: Patient, No Pcp Per  Patient coming from: Home  Chief Complaint:  Chief Complaint  Patient presents with   Shortness of Breath   Leg Swelling   HPI: [name] is a 42 y.o. male with medical history significant of chronic combined systolic and diastolic heart failure (EF of 15% to 20%-05/2022), hypertension, type 2 diabetes, CKD stage II, medication noncompliance who presents with concerns of increasing shortness of breath and lower extremity edema.  HPI was obtained with the help of virtual interpreter as patient is Spanish-speaking only. Patient last admitted in September for acute on chronic CHF due to medication noncompliance and was advised to follow-up outpatient for the start of diuretics due to AKI while being diuresed inpatient.  He had negative fluid balance of over 8 L while hospitalized.  Patient has not followed up outpatient and ran out of all his medications a week ago. Started to have dyspnea and orthopnea. Severe LE edema with weeping. Also has heavy alcohol use. Reports "a lot" and last drank a week ago. Denies illicit drug use.   In the ED, he was afebrile, blood pressure elevated 184/136.  He was hypoxic down to 80 to 91% on room air and placed on 2 L via nasal cannula.  BNP of 833.  Troponin elevated but flat at 73-77.  Chest x-ray concerning for cardiogenic pulmonary edema.  No leukocytosis, hemoglobin of 16.3.  Creatinine stable at 1.51 from a prior of 1.7. Review of Systems: As mentioned in the history of present illness. All other systems reviewed and are negative. Past Medical History:  Diagnosis Date   Heart failure with reduced ejection fraction ([place]) 2017   [facility]   Hypertension 2017   [facility]   Past Surgical History:  Procedure Laterality Date   RIGHT/LEFT  HEART CATH AND CORONARY ANGIOGRAPHY N/A 02/16/2022   Procedure: RIGHT/LEFT HEART CATH AND CORONARY ANGIOGRAPHY;  Surgeon: Sherren Mocha, MD;  Location: [facility];  Service: Cardiovascular;  Laterality: N/A;   Social History:  reports that he has never smoked. He has never used smokeless tobacco. He reports current alcohol use of about 30.0 standard drinks of alcohol per week. He reports that he does not currently use drugs after having used the following drugs: "Crack" cocaine and Cocaine.  No Known Allergies  Family History  Problem Relation Age of Onset   Hypertension Father     Prior to Admission medications   Medication Sig Start Date End Date Taking? Authorizing Provider  empagliflozin (JARDIANCE) 10 MG TABS tablet Take 1 tablet (10 mg total) by mouth daily. 06/07/22  Yes Arrien, Jimmy Picket, MD  losartan (COZAAR) 50 MG tablet Take 1 tablet (50 mg total) by mouth daily. 06/07/22 09/21/22 Yes Arrien, Jimmy Picket, MD  metoprolol succinate (TOPROL-XL) 25 MG 24 hr tablet Take 1 tablet (25 mg total) by mouth daily. 06/07/22 09/21/22 Yes Arrien, Jimmy Picket, MD  rosuvastatin (CRESTOR) 10 MG tablet Take 1 tablet (10 mg total) by mouth daily. 06/07/22 09/21/22 Yes Arrien, Jimmy Picket, MD  spironolactone (ALDACTONE) 25 MG tablet Take 1 tablet (25 mg total) by mouth daily. 06/07/22 09/21/22 Yes Arrien, Jimmy Picket, MD  Accu-Chek Softclix Lancets lancets use to check blood sugar up to four times daily 02/21/22   Thurnell Lose, MD  blood glucose meter kit and supplies KIT Dispense  based on patient and insurance preference. Use up to four times daily as directed. (FOR ICD-9 250.00, 250.01). For QAC - HS accuchecks. 02/21/22   Leroy Sea, MD  blood glucose meter kit and supplies use to check blood sugars 4 times daily 02/21/22   Leroy Sea, MD  glucose blood (ACCU-CHEK GUIDE) test strip Use to test 4 (four) times daily -  before meals and at bedtime. 02/21/22   Leroy Sea, MD    Physical Exam: Vitals:   09/21/22 1900 09/21/22 1945 09/21/22 2001 09/21/22 2001  BP: (!) 140/97 (!) 153/93    Pulse: 87 96 91   Resp: 20  17   Temp:    97.9 F (36.6 C)  TempSrc:    Oral  SpO2: 97% 96% 95%   Weight:      Height:       Constitutional: NAD, calm, comfortable, diaphoretic appearing morbidly obese male sitting upright in bed Eyes:  lids and conjunctivae normal ENMT: Mucous membranes are moist.  Neck: normal, supple Respiratory: clear to auscultation bilaterally, no wheezing, no crackles. Normal respiratory effort. No accessory muscle use.  Cardiovascular: Regular rate and rhythm, no murmurs / rubs / gallops.  +4 pitting edema up to bilateral thigh.   Abdomen: no tenderness,  Bowel sounds positive.  Musculoskeletal: no clubbing / cyanosis. No joint deformity upper and lower extremities. Good ROM, no contractures. Normal muscle tone.  Skin: no rashes, lesions, ulcers. No induration Neurologic: CN 2-12 grossly intact.  Strength 5/5 in all 4.  Psychiatric: Normal judgment and insight. Alert and oriented x 3. Normal mood. Data Reviewed:  See HPI  Assessment and Plan: * Acute on chronic congestive heart failure (HCC) -Acute on chronic combined systolic and diastolic heart failure secondary to medication non-adherent leading to uncontrolled hypertension. 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  Grade 3 diastolic dysfunction.  Severe concentric hypertrophy of the left ventricle -start IV Lasix 40mg  daily for now but likely will need uptitration depending on renal function and output.  Monitor intake and output.  Last admission he had greater than 8 L of negative balance. -Resume losartan-patient not able to afford Entresto due to financial constraints -Continue home spironolactone  Acute hypoxemic respiratory failure (HCC) -secondary to  cardiogenic pulmonary edema requiring 2L via [place] -continue to wean as tolerated with IV diuresis -Goal oxygen saturation of greater than 92%  Type 2 diabetes mellitus with hyperlipidemia (HCC) - Last hemoglobin A1c 3 months ago around 7.5.  Placed on sensitive SSI  Essential hypertension - Uncontrolled due to medication nonadherence.  SBP of 180 on presentation.  This has improved following the administration of 20 mg of IV hydralazine, 25 mg of spironolactone, 50 mg of losartan and 25 mg of metoprolol succinate -Will continue losartan, spironolactone.  Will not reinitiate beta-blocker for now with acute CHF. -PRN IV hydralazine 10mg  for SBP >170  Class 3 obesity (HCC) BMI of 40  Alcohol abuse -reports heavy alcohol use but would not quantify. Reports last use a week ago.  -place on CIWA protocol      Advance Care Planning:Full  Consults: none  Family Communication: none at bedside  Severity of Illness: The appropriate patient status for this patient is INPATIENT. Inpatient status is judged to be reasonable and necessary in order to provide the required intensity of service to ensure the patient's safety. The patient's presenting symptoms,  physical exam findings, and initial radiographic and laboratory data in the context of their chronic comorbidities is felt to place them at high risk for further clinical deterioration. Furthermore, it is not anticipated that the patient will be medically stable for discharge from the hospital within 2 midnights of admission.   * I certify that at the point of admission it is my clinical judgment that the patient will require inpatient hospital care spanning beyond 2 midnights from the point of admission due to high intensity of service, high risk for further deterioration and high frequency of surveillance required.*  Author: Orene Desanctis, DO 09/21/2022 8:07 PM  For on call review [email or web address].

## 2022-09-21 NOTE — Assessment & Plan Note (Addendum)
No signs of alcohol withdrawal syndrome.  Continue neuro checks per unit protocol.

## 2022-09-21 NOTE — Assessment & Plan Note (Addendum)
Glucose has been stable and patient is tolerating po well.  No insulin needed during his hospitalization.

## 2022-09-21 NOTE — Assessment & Plan Note (Signed)
BMI of 40 

## 2022-09-21 NOTE — ED Provider Triage Note (Signed)
Emergency Medicine Provider Triage Evaluation Note  Justin [name] , a 42 y.o. male  was evaluated in triage.  Pt complains of worsening shortness of breath over the past week.  Patient endorses a history of heart failure, fluid retention, and hypertension.  He states he has been out of his medications.  He also endorses increased lower extremity swelling.  He denies chest pain, abdominal pain, nausea, vomiting  Review of Systems  Positive: As above Negative: As above  Physical Exam  There were no vitals taken for this visit. Gen:   Awake, no distress   Resp:  Normal effort  MSK:   Moves extremities without difficulty  Other:  Bilateral dependent edema  Medical Decision Making  Medically screening exam initiated at 10:36 AM.  Appropriate orders placed.  Justin [name] was informed that the remainder of the evaluation will be completed by another provider, this initial triage assessment does not replace that evaluation, and the importance of remaining in the ED until their evaluation is complete.     Justin Peng, PA-C 09/21/22 1037

## 2022-09-21 NOTE — Assessment & Plan Note (Addendum)
With diuresis his blood pressure has improved, continue heart failure management.

## 2022-09-21 NOTE — ED Notes (Signed)
Patient denies pain and is resting comfortably.  

## 2022-09-21 NOTE — ED Notes (Signed)
Patient given food per request

## 2022-09-21 NOTE — Assessment & Plan Note (Signed)
-  Acute on chronic combined systolic and diastolic heart failure secondary to medication non-adherent leading to uncontrolled hypertension. 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  Grade 3 diastolic dysfunction.  Severe concentric hypertrophy of the left ventricle -start IV Lasix 40mg  daily for now but likely will need uptitration depending on renal function and output.  Monitor intake and output.  Last admission he had greater than 8 L of negative balance. -Resume losartan-patient not able to afford Entresto due to financial constraints -Continue home spironolactone

## 2022-09-21 NOTE — ED Provider Notes (Signed)
[facility] EMERGENCY DEPARTMENT Provider Note   CSN: [id number] Arrival date & time: 09/21/22  1026     History  Chief Complaint  Patient presents with   Shortness of Breath   Leg Swelling    Justin [name] is a 42 y.o. male.  Patient presents to the emergency department complaining of bilateral lower extremity swelling and shortness of breath for the past week.  He states that he has not taken any of his medication including his blood pressure medication over the past week.  He denies chest pain, abdominal pain, nausea, vomiting at this time.  Past medical history includes heart failure with reduced ejection fraction, hypertension, polysubstance abuse, nonischemic cardiomyopathy, hypertension, type 2 diabetes mellitus with hyperlipidemia, class III obesity  HPI     Home Medications Prior to Admission medications   Medication Sig Start Date End Date Taking? Authorizing Provider  empagliflozin (JARDIANCE) 10 MG TABS tablet Take 1 tablet (10 mg total) by mouth daily. 06/07/22  Yes Arrien, York Ram, MD  losartan (COZAAR) 50 MG tablet Take 1 tablet (50 mg total) by mouth daily. 06/07/22 09/21/22 Yes Arrien, York Ram, MD  metoprolol succinate (TOPROL-XL) 25 MG 24 hr tablet Take 1 tablet (25 mg total) by mouth daily. 06/07/22 09/21/22 Yes Arrien, York Ram, MD  rosuvastatin (CRESTOR) 10 MG tablet Take 1 tablet (10 mg total) by mouth daily. 06/07/22 09/21/22 Yes Arrien, York Ram, MD  spironolactone (ALDACTONE) 25 MG tablet Take 1 tablet (25 mg total) by mouth daily. 06/07/22 09/21/22 Yes Arrien, York Ram, MD  Accu-Chek Softclix Lancets lancets use to check blood sugar up to four times daily 02/21/22   Leroy Sea, MD  blood glucose meter kit and supplies KIT Dispense based on patient and insurance preference. Use up to four times daily as directed. (FOR ICD-9 250.00, 250.01). For QAC - HS accuchecks. 02/21/22   Leroy Sea, MD   blood glucose meter kit and supplies use to check blood sugars 4 times daily 02/21/22   Leroy Sea, MD  glucose blood (ACCU-CHEK GUIDE) test strip Use to test 4 (four) times daily -  before meals and at bedtime. 02/21/22   Leroy Sea, MD      Allergies    Patient has no known allergies.    Review of Systems   Review of Systems  Respiratory:  Positive for shortness of breath. Negative for cough.   Cardiovascular:  Positive for leg swelling. Negative for chest pain.  Gastrointestinal:  Positive for vomiting. Negative for abdominal pain and nausea.  Genitourinary:  Negative for dysuria.    Physical Exam Updated Vital Signs BP (!) 142/98   Pulse 78   Temp (!) 96.7 F (35.9 C) (Axillary)   Resp (!) 21   Ht 5\' 5"  (1.651 m)   Wt 111 kg   SpO2 98%   BMI 40.72 kg/m  Physical Exam Vitals and nursing note reviewed.  Constitutional:      General: He is not in acute distress.    Appearance: He is well-developed.  HENT:     Head: Normocephalic and atraumatic.  Eyes:     Extraocular Movements: Extraocular movements intact.     Conjunctiva/sclera: Conjunctivae normal.  Cardiovascular:     Rate and Rhythm: Normal rate and regular rhythm.     Heart sounds: No murmur heard. Pulmonary:     Effort: Pulmonary effort is normal. No respiratory distress.     Breath sounds: Normal breath sounds.  Abdominal:  Palpations: Abdomen is soft.     Tenderness: There is no abdominal tenderness.  Musculoskeletal:        General: No swelling.     Cervical back: Neck supple.     Right lower leg: Edema present.     Left lower leg: Edema present.  Skin:    General: Skin is warm and dry.     Capillary Refill: Capillary refill takes less than 2 seconds.  Neurological:     Mental Status: He is alert.  Psychiatric:        Mood and Affect: Mood normal.     ED Results / Procedures / Treatments   Labs (all labs ordered are listed, but only abnormal results are displayed) Labs  Reviewed  BASIC METABOLIC PANEL - Abnormal; Notable for the following components:      Result Value   Glucose, Bld 170 (*)    BUN 23 (*)    Creatinine, Ser 1.51 (*)    GFR, Estimated 59 (*)    All other components within normal limits  CBC WITH DIFFERENTIAL/PLATELET - Abnormal; Notable for the following components:   RBC 5.88 (*)    HCT 53.0 (*)    All other components within normal limits  BRAIN NATRIURETIC PEPTIDE - Abnormal; Notable for the following components:   B Natriuretic Peptide 833.3 (*)    All other components within normal limits  TROPONIN I (HIGH SENSITIVITY) - Abnormal; Notable for the following components:   Troponin I (High Sensitivity) 73 (*)    All other components within normal limits  TROPONIN I (HIGH SENSITIVITY) - Abnormal; Notable for the following components:   Troponin I (High Sensitivity) 77 (*)    All other components within normal limits    EKG EKG Interpretation  Date/Time:  Thursday September 21 2022 10:28:34 EST Ventricular Rate:  111 PR Interval:  174 QRS Duration: 102 QT Interval:  372 QTC Calculation: 505 R Axis:   179 Text Interpretation: Sinus tachycardia with Premature atrial complexes with Abberant conduction Right axis deviation Abnormal ECG When compared with ECG of 02-Jun-2022 03:32, PREVIOUS ECG IS PRESENT Confirmed by Octaviano Glow [phone]) on 09/21/2022 5:10:05 PM  Radiology DG Chest 2 View  Result Date: 09/21/2022 CLINICAL DATA:  Shortness of breath EXAM: CHEST - 2 VIEW COMPARISON:  June 02, 2022 FINDINGS: The cardiomediastinal silhouette is unchanged and enlarged in contour. No pleural effusion. No pneumothorax. Peribronchial cuffing with diffuse interstitial prominence and bibasilar heterogeneous opacities. Visualized abdomen is unremarkable. Minimal degenerative changes of the thoracic spine. IMPRESSION: Constellation of findings are favored to reflect pulmonary edema with bibasilar atelectasis. Electronically Signed   By:  Valentino Saxon M.D.   On: 09/21/2022 11:03    Procedures Procedures    Medications Ordered in ED Medications  spironolactone (ALDACTONE) tablet 25 mg (25 mg Oral Given 09/21/22 1426)  furosemide (LASIX) injection 40 mg (40 mg Intravenous Given 09/21/22 1420)  metoprolol tartrate (LOPRESSOR) tablet 25 mg (25 mg Oral Given 09/21/22 1426)  losartan (COZAAR) tablet 50 mg (50 mg Oral Given 09/21/22 1732)  hydrALAZINE (APRESOLINE) injection 20 mg (20 mg Intravenous Given 09/21/22 1732)    ED Course/ Medical Decision Making/ A&P                           Medical Decision Making Amount and/or Complexity of Data Reviewed Labs: ordered. Radiology: ordered.  Risk Prescription drug management.   This patient presents to the ED for concern of shortness  of breath and lower extremity swelling, this involves an extensive number of treatment options, and is a complaint that carries with it a high risk of complications and morbidity.  The differential diagnosis includes CHF exacerbation, viral infection, respiratory failure, others   Co morbidities that complicate the patient evaluation  History of chronic diastolic heart failure, class III obesity, type 2 diabetes mellitus   Additional history obtained:   External records from outside source obtained and reviewed including discharge summary from September 26 after the patient was admitted for acute on chronic systolic CHF.  At that time he presented on the 22nd due to dyspnea.  He had also been hospitalized in June of this past year for the same.  He has a history of noncompliance with medications.  During the last hospitalization he had stopped taking his medications for approximately 2 months.   Lab Tests:  I Ordered, and personally interpreted labs.  The pertinent results include: BNP 833.3, troponin 77, BMP with creatinine 1.51 (near baseline), grossly unremarkable CBC   Imaging Studies ordered:  I ordered imaging studies including  chest x-ray I independently visualized and interpreted imaging which showed signs of pulmonary edema I agree with the radiologist interpretation   Cardiac Monitoring: / EKG:  The patient was maintained on a cardiac monitor.  I personally viewed and interpreted the cardiac monitored which showed an underlying rhythm of: Sinus tachycardia   Consultations Obtained:  I requested consultation with the hospitalist, Dr.Tu, and discussed lab and imaging findings as well as pertinent plan - they recommend: admission   Problem List / ED Course / Critical interventions / Medication management   I ordered medication including Lasix for fluid overload, spironolactone, hydralazine, losartan, metoprolol for hypertensive urgency  Reevaluation of the patient after these medicines showed that the patient improved I have reviewed the patients home medicines and have made adjustments as needed    Test / Admission - Considered:  Patient appears to have fluid overload due to an exacerbation of underlying congestive heart failure likely due to noncompliance with medications.  It does not appear that the patient has followed up as an outpatient for diuretic therapy.  He has been not taking medications for the past week.  He states he is out of his medications.  The patient was borderline hypoxic on arrival with SpO2 saturation greater than 90%.  On 2 L he is able to maintain oxygen saturations in the upper 90 range.  He does still continue to feel short of breath.  His blood pressure has been significantly elevated.  His BNP is higher than what is since the last admission.  I believe he would benefit from admission for further IV diuretics prior to discharge home.        Final Clinical Impression(s) / ED Diagnoses Final diagnoses:  Acute on chronic heart failure, unspecified heart failure type ([place])  Shortness of breath  Hypertensive urgency    Rx / DC Orders ED Discharge Orders     None          Ronny Bacon 09/21/22 1844    Isla Pence, MD 09/22/22 [phone]

## 2022-09-22 ENCOUNTER — Other Ambulatory Visit (HOSPITAL_COMMUNITY): Payer: Self-pay

## 2022-09-22 ENCOUNTER — Encounter (HOSPITAL_COMMUNITY): Payer: Self-pay | Admitting: Family Medicine

## 2022-09-22 DIAGNOSIS — I4891 Unspecified atrial fibrillation: Secondary | ICD-10-CM

## 2022-09-22 DIAGNOSIS — I5023 Acute on chronic systolic (congestive) heart failure: Secondary | ICD-10-CM

## 2022-09-22 DIAGNOSIS — I480 Paroxysmal atrial fibrillation: Secondary | ICD-10-CM

## 2022-09-22 LAB — BASIC METABOLIC PANEL
Anion gap: 8 (ref 5–15)
BUN: 20 mg/dL (ref 6–20)
CO2: 28 mmol/L (ref 22–32)
Calcium: 8.7 mg/dL — ABNORMAL LOW (ref 8.9–10.3)
Chloride: 100 mmol/L (ref 98–111)
Creatinine, Ser: 1.52 mg/dL — ABNORMAL HIGH (ref 0.61–1.24)
GFR, Estimated: 58 mL/min — ABNORMAL LOW (ref >60)
Glucose, Bld: 123 mg/dL — ABNORMAL HIGH (ref 70–99)
Potassium: 3.7 mmol/L (ref 3.5–5.1)
Sodium: 136 mmol/L (ref 135–145)

## 2022-09-22 LAB — CBG MONITORING, ED
Glucose-Capillary: 144 mg/dL — ABNORMAL HIGH (ref 70–99)
Glucose-Capillary: 161 mg/dL — ABNORMAL HIGH (ref 70–99)
Glucose-Capillary: 246 mg/dL — ABNORMAL HIGH (ref 70–99)

## 2022-09-22 LAB — GLUCOSE, CAPILLARY
Glucose-Capillary: 150 mg/dL — ABNORMAL HIGH (ref 70–99)
Glucose-Capillary: 161 mg/dL — ABNORMAL HIGH (ref 70–99)

## 2022-09-22 LAB — CBC
HCT: 49.4 % (ref 39.0–52.0)
Hemoglobin: 15.4 g/dL (ref 13.0–17.0)
MCH: 28.1 pg (ref 26.0–34.0)
MCHC: 31.2 g/dL (ref 30.0–36.0)
MCV: 90.1 fL (ref 80.0–100.0)
Platelets: 337 10*3/uL (ref 150–400)
RBC: 5.48 MIL/uL (ref 4.22–5.81)
RDW: 14 % (ref 11.5–15.5)
WBC: 8.6 10*3/uL (ref 4.0–10.5)
nRBC: 0 % (ref 0.0–0.2)

## 2022-09-22 MED ORDER — LOSARTAN POTASSIUM 50 MG PO TABS
50.0000 mg | ORAL_TABLET | Freq: Every day | ORAL | Status: DC
  Administered 2022-09-22 – 2022-09-25 (×4): 50 mg via ORAL
  Filled 2022-09-22 (×5): qty 1

## 2022-09-22 MED ORDER — METOPROLOL TARTRATE 5 MG/5ML IV SOLN: 5.0000 mg | INTRAVENOUS | Status: DC | PRN

## 2022-09-22 MED ORDER — POTASSIUM CHLORIDE CRYS ER 20 MEQ PO TBCR
40.0000 meq | EXTENDED_RELEASE_TABLET | Freq: Once | ORAL | Status: AC
  Administered 2022-09-22: 40 meq via ORAL
  Filled 2022-09-22: qty 2

## 2022-09-22 MED ORDER — METOPROLOL TARTRATE 5 MG/5ML IV SOLN
INTRAVENOUS | Status: AC
  Administered 2022-09-22: 5 mg via INTRAVENOUS
  Filled 2022-09-22: qty 5

## 2022-09-22 MED ORDER — INFLUENZA VAC SPLIT QUAD 0.5 ML IM SUSY
0.5000 mL | PREFILLED_SYRINGE | INTRAMUSCULAR | Status: AC
  Administered 2022-09-25: 0.5 mL via INTRAMUSCULAR
  Filled 2022-09-22 (×2): qty 0.5

## 2022-09-22 MED ORDER — FUROSEMIDE 10 MG/ML IJ SOLN
80.0000 mg | Freq: Two times a day (BID) | INTRAMUSCULAR | Status: DC
  Administered 2022-09-22 – 2022-09-24 (×4): 80 mg via INTRAVENOUS
  Filled 2022-09-22 (×4): qty 8

## 2022-09-22 MED ORDER — METOPROLOL TARTRATE 5 MG/5ML IV SOLN
5.0000 mg | INTRAVENOUS | Status: DC | PRN
  Administered 2022-09-22: 5 mg via INTRAVENOUS
  Filled 2022-09-22: qty 5

## 2022-09-22 MED ORDER — METOPROLOL SUCCINATE ER 25 MG PO TB24
25.0000 mg | ORAL_TABLET | Freq: Every day | ORAL | Status: DC
  Administered 2022-09-22 – 2022-09-26 (×5): 25 mg via ORAL
  Filled 2022-09-22 (×5): qty 1

## 2022-09-22 MED ORDER — EMPAGLIFLOZIN 10 MG PO TABS
10.0000 mg | ORAL_TABLET | Freq: Every day | ORAL | Status: DC
  Administered 2022-09-22 – 2022-09-26 (×5): 10 mg via ORAL
  Filled 2022-09-22 (×5): qty 1

## 2022-09-22 NOTE — Progress Notes (Signed)
Progress Note   Patient: Justin [name] WUJ:[id number] DOB: 1980-10-02 DOA: 09/21/2022     1 DOS: the patient was seen and examined on 09/22/2022   Brief hospital course: Mr. Devera was admitted to the hospital with the working diagnosis of decompensated heart failure.   42 yo male with the past medical history of heart failure, hypertension, T2DM, CKD and obesity who presented with dyspnea and edema. Patient run out medications for the last 7 days, he developed worsening dyspnea, orthopnea and lower extremity edema. He continue to drink alcohol. On his initial physical examination his blood pressure is 184/136. 02 saturation 80 to 91%, RR 17 and HR 87, lungs with no wheezing or rales, heart with S1 and S2 present and rhythmic, abdomen with no distention and positive lower extremity edema.   Na 136, K 4,0 CL 99, bicarbonate 28, glucose 170 bun 23 cr 1,51 BNP 833 High sensitive troponin 73 and 77  Wbc 9,3 hgb 16.3 plt 327   Chest radiograph with cardiomegaly, bilateral hilar vascular congestion, bilateral interstitial infiltrates.   EKG 111 bpm, right axis, qtc 505, sinus rhythm with pac and pvc, poor R R wave progression, with no significant ST segment or T wave changes.   Patient was placed on IV furosemide for diuresis.   Developed atrial fibrillation with RVR.   10/12 converted to sinus rhythm.    Assessment and Plan: * Acute on chronic systolic CHF (congestive heart failure) (HCC) Echocardiogram with reduced LV systolic function EF 15 to 20%, global hypokinesis, RV mild dilatation. RV with mild reduction in systolic function. Moderate to severe MR, LA with severe enlargement.   Urine output is 9562 cc Systolic blood pressure 130 to 140 mmHg.   Plan to continue diuresis with IV furosemide 80 mg bid Medical therapy with losartan, empagliflozin and spironolactone.  Patient has been non compliant with outpatient medical therapy.   Acute hypoxemic respiratory failure  due to acute cardiogenic pulmonary edema. Continue diuresis and supplemental 02 per [place] to keep 02 saturation 92% or greater.    Acute on chronic congestive heart failure (HCC) -Acute on chronic combined systolic and diastolic heart failure secondary to medication non-adherent leading to uncontrolled hypertension. 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  Grade 3 diastolic dysfunction.  Severe concentric hypertrophy of the left ventricle -start IV Lasix 40mg  daily for now but likely will need uptitration depending on renal function and output.  Monitor intake and output.  Last admission he had greater than 8 L of negative balance. -Resume losartan-patient not able to afford Entresto due to financial constraints -Continue home spironolactone  Paroxysmal atrial fibrillation with RVR ([place]) Patient has converted to sinus rhythm Continue telemetry monitoring.   If he goes back in atrial fibrillation with RVR will need amiodarone drip for rate control.  Considering his low EF not candidate for AV blockade with diltiazem or metoprolol.    Acute hypoxemic respiratory failure (HCC) -secondary to cardiogenic pulmonary edema requiring 2L via [place] -continue to wean as tolerated with IV diuresis -Goal oxygen saturation of greater than 92%  Essential hypertension Continue blood pressure control with losartan Continue aggressive diuresis.   Type 2 diabetes mellitus with hyperlipidemia (HCC) - Last hemoglobin A1c 3 months ago around 7.5.  Placed on sensitive SSI  Chronic kidney disease, stage II (mild) Renal function with serum cr at 1,52 with K at 3,7 and serum bicarbonate at  28. Plan to continue diuresis and close follow up renal function and electrolytes.   Alcohol abuse -reports heavy alcohol use but would not quantify. Reports last use a week ago.  -place on CIWA protocol  Class 3  obesity (HCC) BMI of 40        Subjective: Patient with no chest pain, dyspnea is improving but not back to baseline, continue with significant edema   Physical Exam: Vitals:   09/22/22 1130 09/22/22 1355 09/22/22 1600 09/22/22 1603  BP: (!) 151/110 (!) 161/104  (!) 146/90  Pulse: 97 96  88  Resp: (!) 21 20  19   Temp:  98.2 F (36.8 C)  98.2 F (36.8 C)  TempSrc:  Oral  Oral  SpO2: 94% 95% 92% 91%  Weight:      Height:       Neurology awake and alert ENT with mild pallor Cardiovascular with S1 and S2 present and rhythmic with no gallops or rubs, positive murmur at the apex.  Respiratory with with bilateral rales with no wheezing or rhonchi\ Abdomen with no distention Positive lower extremity edema +++  Data Reviewed:    Family Communication: no family at the bedside   Disposition: Status is: Inpatient Remains inpatient appropriate because: IV diuresis   Planned Discharge Destination: Home      Author: Tawni Millers, MD 09/22/2022 6:23 PM  For on call review [email or web address].

## 2022-09-22 NOTE — Progress Notes (Signed)
Othro tech call to place [employer] new order

## 2022-09-22 NOTE — Consult Note (Addendum)
Advanced Heart Failure Team Consult Note   Primary Physician: Patient, No Pcp Per PCP-Cardiologist:  Buford Dresser, MD  Reason for Consultation: Acute on chronic combine systolic and diastolic CHF  HPI:    Justin [name] is seen today for evaluation of acute on chronic combined systolic and diastolic CHF at the request of Dr. Cathlean Sauer, internal medicine.   Justin [name] is a 42 y.o. Spanish-speaking male with history of chronic systolic CHF, hx uncontrolled HTN, DM II, mitral regurgitation, ETOH abuse, hx "crack"/cocaine use.   Heart failure dates back to 2017. Admitted to [facility] with acute systolic CHF and hypertensive urgency. Apparently echo showed severe LV dsyfunction and severe LVH. Cath with no CAD.   Admitted in 06/23 with acute respiratory failure with hypoxia requiring BiPAP 2/2 a/c CHF. He was diuresed and initiated on GDMT.    After discharge he no-showed TOC and cardiology clinic follow-ups.    He has been out of his meds for a month now. 1 week ago he started to notice the leg swelling and he started to get SOB with ambulating short distances. Continues to drink beer, he states he's cutting back but still drinks >6 beers on the weekend and will drink during the week if beer is available. Reports last day he used cocaine was this past weekend. Primarily eats out, lives with a friend. Works as a Curator. No family history of heart disease. When asked why he doesn't show up to his f/u appts he states "pure laziness".   In the ED BNP 833, Cr 1.51 (baseline around 1.3), CXR with pulmonary edema. Found to be in a fib RVR vs flutter vs ST? Req multiple doses of hydralazine / lopressor. Hypertensive up to 180s, PRNs given. Started diuresing with IV lasix.   In stretcher, denies CP.   Cardiac studies reviewed:   Echo 9/23: EF 15-20%, GIIIDD, RV mildly dilated and mod reduced function, Mod MR, trivial TR Echo 02/09/22: EF 30-35%, RV okay, mild  to moderate MR. [facility] 02/16/22: No significant CAD, RA mean 1, PA mean 19, PCWP 3, LVEDP 7, Fick CO/CI 4.17/1.99 cMRI 02/20/22: LVEF 29%, LV severely dilated, asymmetric LVH with basal septum measuring 17 mm, RVEF 45%, patchy LGE in setpum and RV insertion sites + subepicardial LGE in inferolateral wall. Scar pattern felt to be consistent with HCM. However, d/t subepicardial LGE in inferolateral wall and presence of mediastinal lymphadenopathy, cardiac PET recommended to r/o sarcoid, small pericardial effusion.  Echo 08/19: EF improved to 55%, severe LVH, RV okay  Home Medications Prior to Admission medications   Medication Sig Start Date End Date Taking? Authorizing Provider  empagliflozin (JARDIANCE) 10 MG TABS tablet Take 1 tablet (10 mg total) by mouth daily. 06/07/22  Yes Arrien, Jimmy Picket, MD  losartan (COZAAR) 50 MG tablet Take 1 tablet (50 mg total) by mouth daily. 06/07/22 09/21/22 Yes Arrien, Jimmy Picket, MD  metoprolol succinate (TOPROL-XL) 25 MG 24 hr tablet Take 1 tablet (25 mg total) by mouth daily. 06/07/22 09/21/22 Yes Arrien, Jimmy Picket, MD  rosuvastatin (CRESTOR) 10 MG tablet Take 1 tablet (10 mg total) by mouth daily. 06/07/22 09/21/22 Yes Arrien, Jimmy Picket, MD  spironolactone (ALDACTONE) 25 MG tablet Take 1 tablet (25 mg total) by mouth daily. 06/07/22 09/21/22 Yes Arrien, Jimmy Picket, MD  Accu-Chek Softclix Lancets lancets use to check blood sugar up to four times daily 02/21/22   Thurnell Lose, MD  blood glucose meter kit and supplies KIT Dispense  based on patient and insurance preference. Use up to four times daily as directed. (FOR ICD-9 250.00, 250.01). For QAC - HS accuchecks. 02/21/22   Thurnell Lose, MD  blood glucose meter kit and supplies use to check blood sugars 4 times daily 02/21/22   Thurnell Lose, MD  glucose blood (ACCU-CHEK GUIDE) test strip Use to test 4 (four) times daily -  before meals and at bedtime. 02/21/22   Thurnell Lose, MD     Past Medical History: Past Medical History:  Diagnosis Date   Heart failure with reduced ejection fraction ([place]) 2017   [facility]   Hypertension 2017   [facility]    Past Surgical History: Past Surgical History:  Procedure Laterality Date   RIGHT/LEFT HEART CATH AND CORONARY ANGIOGRAPHY N/A 02/16/2022   Procedure: RIGHT/LEFT HEART CATH AND CORONARY ANGIOGRAPHY;  Surgeon: Sherren Mocha, MD;  Location: [place] [facility];  Service: Cardiovascular;  Laterality: N/A;    Family History: Family History  Problem Relation Age of Onset   Hypertension Father     Social History: Social History   Socioeconomic History   Marital status: Single    Spouse name: Not on file   Number of children: 3   Years of education: Not on file   Highest education level: 7th grade  Occupational History   Occupation: Curator    Comment: varies  Tobacco Use   Smoking status: Never   Smokeless tobacco: Never  Vaping Use   Vaping Use: Never used  Substance and Sexual Activity   Alcohol use: Yes    Alcohol/week: 30.0 standard drinks of alcohol    Types: 30 Cans of beer per week    Comment: 30-40 on weekends   Drug use: Not Currently    Types: "Crack" cocaine, Cocaine    Comment: 2 weeks   Sexual activity: Not on file  Other Topics Concern   Not on file  Social History Narrative   Not on file   Social Determinants of Health   Financial Resource Strain: Medium Risk (06/05/2022)   Overall Financial Resource Strain (CARDIA)    Difficulty of Paying Living Expenses: Somewhat hard  Food Insecurity: No Food Insecurity (06/05/2022)   Hunger Vital Sign    Worried About Running Out of Food in the Last Year: Never true    Ran Out of Food in the Last Year: Never true  Transportation Needs: No Transportation Needs (06/05/2022)   PRAPARE - Hydrologist (Medical): No    Lack of Transportation (Non-Medical): No  Physical Activity: Not on file   Stress: Not on file  Social Connections: Not on file    Allergies:  No Known Allergies  Objective:    Vital Signs:   Temp:  [96.7 F (35.9 C)-98.4 F (36.9 C)] 98 F (36.7 C) (01/12 0920) Pulse Rate:  [42-165] 90 (01/12 0920) Resp:  [11-29] 19 (01/12 0920) BP: (107-174)/(81-140) 118/91 (01/12 0920) SpO2:  [90 %-98 %] 97 % (01/12 0920) Weight:  [111 kg] 111 kg (01/11 1406) Last BM Date : 09/21/22  Weight change: Filed Weights   09/21/22 1406  Weight: 111 kg    Intake/Output:   Intake/Output Summary (Last 24 hours) at 09/22/2022 1158 Last data filed at 09/21/2022 1816 Gross per 24 hour  Intake --  Output 1200 ml  Net -1200 ml      Physical Exam    General:  chronically ill appearing.  No respiratory difficulty HEENT:  normal Neck: supple. JVD to jaw. Carotids 2+ bilat; no bruits. No lymphadenopathy or thyromegaly appreciated. Cor: PMI nondisplaced. Regular rate & rhythm. No rubs, gallops or murmurs. Lungs: clear Abdomen: soft, nontender, distended. No hepatosplenomegaly. No bruits or masses. Good bowel sounds. Extremities: no cyanosis, clubbing, rash, +2 BLE edema  Neuro: alert & oriented x 3, cranial nerves grossly intact. moves all 4 extremities w/o difficulty. Affect pleasant.   Telemetry   NSR 80s, converted from fib/flutter? today at 0750. Short runs of NSVT.  (Personally reviewed)    EKG    NSR, PACs, LAE 94 bpm  Labs   Basic Metabolic Panel: Recent Labs  Lab 09/21/22 1047 09/22/22 0530  NA 136 136  K 4.0 3.7  CL 99 100  CO2 28 28  GLUCOSE 170* 123*  BUN 23* 20  CREATININE 1.51* 1.52*  CALCIUM 9.0 8.7*    Liver Function Tests: No results for input(s): "AST", "ALT", "ALKPHOS", "BILITOT", "PROT", "ALBUMIN" in the last 168 hours. No results for input(s): "LIPASE", "AMYLASE" in the last 168 hours. No results for input(s): "AMMONIA" in the last 168 hours.  CBC: Recent Labs  Lab 09/21/22 1047 09/22/22 0530  WBC 9.3 8.6  NEUTROABS 6.9   --   HGB 16.3 15.4  HCT 53.0* 49.4  MCV 90.1 90.1  PLT 327 337    Cardiac Enzymes: No results for input(s): "CKTOTAL", "CKMB", "CKMBINDEX", "TROPONINI" in the last 168 hours.  BNP: BNP (last 3 results) Recent Labs    02/17/22 0611 06/02/22 0322 09/21/22 1047  BNP 193.8* 551.9* 833.3*    ProBNP (last 3 results) No results for input(s): "PROBNP" in the last 8760 hours.   CBG: Recent Labs  Lab 09/22/22 0032 09/22/22 0758 09/22/22 1105  GLUCAP 161* 246* 144*    Coagulation Studies: No results for input(s): "LABPROT", "INR" in the last 72 hours.   Imaging   No results found.   Medications:     Current Medications:  enoxaparin (LOVENOX) injection  40 mg Subcutaneous Q24H   folic acid  1 mg Oral Daily   furosemide  40 mg Intravenous Daily   insulin aspart  0-9 Units Subcutaneous TID WC   multivitamin with minerals  1 tablet Oral Daily   rosuvastatin  10 mg Oral Daily   spironolactone  25 mg Oral Daily   thiamine  100 mg Oral Daily   Or   thiamine  100 mg Intravenous Daily    Infusions:   Patient Profile   Justin [name] is a 42 y.o. Spanish-speaking male with history of chronic systolic CHF, hx uncontrolled HTN, DM II, mitral regurgitation, ETOH abuse, hx "crack"/cocaine use. AHF team asked to see for acute on chronic combined systolic and diastolic heart failure.   Assessment/Plan  Acute on chronic combined systolic and diastolic CHF: -EF severely reduced in 2017 in setting of uncontrolled HTN. EF recovered in 2019. -Echo 02/09/22: EF 30-35%, RV okay, mild to moderate MR. -R/LHC 02/16/22: No significant CAD, RA mean 1, PA mean 19, PCWP 3, LVEDP 7, Fick CO/CI 4.17/1.99 -cMRI 02/20/22: LVEF 29%, LV severely dilated, asymmetric LVH with basal septum measuring 17 mm, RVEF 45%, patchy LGE in setpum and RV insertion sites + subepicardial LGE in inferolateral wall. Scar pattern felt to be consistent with HCM. However, d/t subepicardial LGE in  inferolateral wall and presence of mediastinal lymphadenopathy, cardiac PET recommended to r/o sarcoid, small pericardial effusion.  - Echo 9/23: EF 15-20%, GIIIDD, RV mildly dilated and mod reduced function, Mod MR,  trivial TR -NYHA IV on admission. Volume overloaded.  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  - Volume overloaded. Currently on 40 IV lasix daily, will increase to 80 mg BID - Start home Losartan 50 mg daily - Continue metoprolol 25 mg daily  - Continue Jardiance 10mg  daily  - Continue spiro 25 mg daily - Place UNNA boots - Strict I&O, daily weights   2. Afib vs flutter vs ST? - Asymptomatic, rates into 170s - will review with MD, p waves on EKG? - back in NSR   3. Uncontrolled HTN: -Up to 180's on admission -Received multiple doses of hydralazine and metoprolol yesterday - starting GDMT as above   4. Polysubstance abuse: -ETOH and crack/cocaine -Cessation discussed -Consult HF CSW to assist with resources, previously consulted during last admission and failed to reply multiple times. Will reattempt.    5. DM II: - A1c 9.9 6/23>7.5 9/23 - Not taking any meds PTA - recheck A1c tomorrow morning   6. CKD II: - Scr averaging 1.3-1.5 - 1.52 today, close to baseline with diuresis    7. Hyperlipidemia - LDL 173 9/23, continue statin    SDOH: -Uninsured. Not a 10/23 citizen.  - HF TOC CSW consulted - Attempted to introduce paramedicine during last admission but patient never answered.  - Had lengthy conversation about his health and bad habits. Acknowledges them and wishes to decrease ETOH  and cocaine use. Difficult for him to cook as he lives with a friend and is frequently influenced to eat out. Also discussed medication assistance in length to promote med compliance.   Length of Stay: 1  [place], NP  09/22/2022, 11:58 AM  Advanced  Heart Failure Team Pager [phone] (M-F; 7a - 5p)  Please contact [facility] for night-coverage after hours (4p -7a ) and weekends on [email or web address]  Patient seen with NP, agree with the above note.    He has not been taking meds at home, still using cocaine.  Admitted with marked volume overload.  He was noted to be in atrial fibrillation with RVR during this admission, currently in NSR.   General: NAD Neck: Thick, JVP 16+, no thyromegaly or thyroid nodule.  Lungs: Clear to auscultation bilaterally with normal respiratory effort. CV: Nondisplaced PMI.  Heart regular S1/S2, no S3/S4, no murmur.  2+ edema to knees.  No carotid bruit.  Normal pedal pulses.  Abdomen: Soft, nontender, no hepatosplenomegaly, no distention.  Skin: Intact without lesions or rashes.  Neurologic: Alert and oriented x 3.  Psych: Normal affect. Extremities: No clubbing or cyanosis.  HEENT: Normal.   Nonischemic cardiomyopathy, uncontrolled HTN and cocaine use likely play a role, but there is also concern that he has hypertrophic cardiomyopathy.  He has not been taking meds at home, admitted with marked volume overload and dyspnea.  BP elevated.  Creatinine stable from prior at 1.5. Situation complicated because he is not a citizen and has no insurance.  - Lasix 80 mg IV bid. - Can restart losartan 50 mg daily, Toprol XL 25 mg daily, spironolactone 25 mg daily, Jardiance 10 mg daily.  - Consult social worker to help with meds as outpatient.   He was in atrial fibrillation/RVR, now back in NSR.  Will start Xarelto, but not sure he will be able to get as outpatient.    [phone] 09/22/2022 4:33 PM

## 2022-09-22 NOTE — ED Notes (Signed)
MD Arrien notified and aware of cardiac rhythm change.  No new orders at this time.

## 2022-09-22 NOTE — Hospital Course (Signed)
Justin [name] was admitted to the hospital with the working diagnosis of decompensated heart failure.   42 yo male with the past medical history of heart failure, hypertension, T2DM, CKD and obesity who presented with dyspnea and edema. Patient run out medications for the last 7 days, he developed worsening dyspnea, orthopnea and lower extremity edema. He continue to drink alcohol. On his initial physical examination his blood pressure is 184/136. 02 saturation 80 to 91%, RR 17 and HR 87, lungs with no wheezing or rales, heart with S1 and S2 present and rhythmic, abdomen with no distention and positive lower extremity edema.   Na 136, K 4,0 CL 99, bicarbonate 28, glucose 170 bun 23 cr 1,51 BNP 833 High sensitive troponin 73 and 77  Wbc 9,3 hgb 16.3 plt 327   Chest radiograph with cardiomegaly, bilateral hilar vascular congestion, bilateral interstitial infiltrates.   EKG 111 bpm, right axis, qtc 505, sinus rhythm with pac and pvc, poor R R wave progression, with no significant ST segment or T wave changes.   Patient was placed on IV furosemide for diuresis.   Developed atrial fibrillation with RVR.   10/12 converted to sinus rhythm.

## 2022-09-22 NOTE — ED Notes (Signed)
ED TO INPATIENT HANDOFF REPORT  ED Nurse Name and Phone #:  Laiyah Exline [phone]  S Name/Age/Gender Justin [name] 42 y.o. male Room/Bed: 046C/046C  Code Status   Code Status: Full Code  Home/SNF/Other Home Patient oriented to: self, place, time, and situation Is this baseline? Yes   Triage Complete: Triage complete  Chief Complaint Acute on chronic congestive heart failure (HCC) [I50.9]  Triage Note Translator/pt Im haing swelling, and decrease in oxgen meaning SOB. This started a week ago.  Pt. Has not had his BP medicine in a week just have not picked it up.   Allergies No Known Allergies  Level of Care/Admitting Diagnosis ED Disposition     ED Disposition  Admit   Condition  --   Comment  Hospital Area: [facility] [[id number]]  Level of Care: Telemetry Cardiac [[id number]]  May admit patient to [facility] or [facility] if equivalent level of care is available:: No  Covid Evaluation: Asymptomatic - no recent exposure (last 10 days) testing not required  Diagnosis: Acute on chronic congestive heart failure [facility]) [[id number]]  Admitting Physician: Anselm Jungling [[id number]]  Attending Physician: Anselm Jungling [[id number]]  Certification:: I certify this patient will need inpatient services for at least 2 midnights  Estimated Length of Stay: 5          B Medical/Surgery History Past Medical History:  Diagnosis Date   Heart failure with reduced ejection fraction (HCC) 2017   [facility]   Hypertension 2017   [facility]   Past Surgical History:  Procedure Laterality Date   RIGHT/LEFT HEART CATH AND CORONARY ANGIOGRAPHY N/A 02/16/2022   Procedure: RIGHT/LEFT HEART CATH AND CORONARY ANGIOGRAPHY;  Surgeon: Tonny Bollman, MD;  Location: [facility] INVASIVE CV LAB;  Service: Cardiovascular;  Laterality: N/A;     A IV Location/Drains/Wounds Patient Lines/Drains/Airways Status     Active Line/Drains/Airways     Name Placement date Placement  time Site Days   Peripheral IV 09/21/22 20 G Right Antecubital 09/21/22  1418  Antecubital  1            Intake/Output Last 24 hours  Intake/Output Summary (Last 24 hours) at 09/22/2022 1249 Last data filed at 09/21/2022 1816 Gross per 24 hour  Intake --  Output 1200 ml  Net -1200 ml    Labs/Imaging Results for orders placed or performed during the hospital encounter of 09/21/22 (from the past 48 hour(s))  Basic metabolic panel     Status: Abnormal   Collection Time: 09/21/22 10:47 AM  Result Value Ref Range   Sodium 136 135 - 145 mmol/L   Potassium 4.0 3.5 - 5.1 mmol/L   Chloride 99 98 - 111 mmol/L   CO2 28 22 - 32 mmol/L   Glucose, Bld 170 (H) 70 - 99 mg/dL    Comment: Glucose reference range applies only to samples taken after fasting for at least 8 hours.   BUN 23 (H) 6 - 20 mg/dL   Creatinine, Ser 0.34 (H) 0.61 - 1.24 mg/dL   Calcium 9.0 8.9 - 74.2 mg/dL   GFR, Estimated 59 (L) >60 mL/min    Comment: (NOTE) Calculated using the CKD-EPI Creatinine Equation (2021)    Anion gap 9 5 - 15    Comment: Performed at [facility], [address], [place], [place] [address]  CBC with Differential     Status: Abnormal   Collection Time: 09/21/22 10:47 AM  Result Value Ref Range  WBC 9.3 4.0 - 10.5 K/uL   RBC 5.88 (H) 4.22 - 5.81 MIL/uL   Hemoglobin 16.3 13.0 - 17.0 g/dL   HCT 53.0 (H) 39.0 - 52.0 %   MCV 90.1 80.0 - 100.0 fL   MCH 27.7 26.0 - 34.0 pg   MCHC 30.8 30.0 - 36.0 g/dL   RDW 13.8 11.5 - 15.5 %   Platelets 327 150 - 400 K/uL   nRBC 0.0 0.0 - 0.2 %   Neutrophils Relative % 74 %   Neutro Abs 6.9 1.7 - 7.7 K/uL   Lymphocytes Relative 13 %   Lymphs Abs 1.2 0.7 - 4.0 K/uL   Monocytes Relative 9 %   Monocytes Absolute 0.8 0.1 - 1.0 K/uL   Eosinophils Relative 3 %   Eosinophils Absolute 0.2 0.0 - 0.5 K/uL   Basophils Relative 1 %   Basophils Absolute 0.1 0.0 - 0.1 K/uL   Immature Granulocytes 0 %   Abs Immature Granulocytes 0.03 0.00 - 0.07 K/uL     Comment: Performed at [facility] [address], [place], [place] [address]  Brain natriuretic peptide     Status: Abnormal   Collection Time: 09/21/22 10:47 AM  Result Value Ref Range   B Natriuretic Peptide 833.3 (H) 0.0 - 100.0 pg/mL    Comment: Performed at [facility] [address], [place], [place] [address]  Troponin I (High Sensitivity)     Status: Abnormal   Collection Time: 09/21/22  2:03 PM  Result Value Ref Range   Troponin I (High Sensitivity) 73 (H) <18 ng/L    Comment: (NOTE) Elevated high sensitivity troponin I (hsTnI) values and significant  changes across serial measurements may suggest ACS but many other  chronic and acute conditions are known to elevate hsTnI results.  Refer to the "Links" section for chest pain algorithms and additional  guidance. Performed at [facility], [place] [address], [place], [place] [address]   Troponin I (High Sensitivity)     Status: Abnormal   Collection Time: 09/21/22  4:05 PM  Result Value Ref Range   Troponin I (High Sensitivity) 77 (H) <18 ng/L    Comment: (NOTE) Elevated high sensitivity troponin I (hsTnI) values and significant  changes across serial measurements may suggest ACS but many other  chronic and acute conditions are known to elevate hsTnI results.  Refer to the "Links" section for chest pain algorithms and additional  guidance. Performed at [facility], [place] [address] [place], [place] [address]   CBG monitoring, ED     Status: Abnormal   Collection Time: 09/22/22 12:32 AM  Result Value Ref Range   Glucose-Capillary 161 (H) 70 - 99 mg/dL    Comment: Glucose reference range applies only to samples taken after fasting for at least 8 hours.  CBC     Status: None   Collection Time: 09/22/22  5:30 AM  Result Value Ref Range   WBC 8.6 4.0 - 10.5 K/uL   RBC 5.48 4.22 - 5.81 MIL/uL   Hemoglobin 15.4 13.0 - 17.0 g/dL   HCT 49.4 39.0 - 52.0 %   MCV 90.1 80.0 - 100.0 fL   MCH 28.1 26.0 -  34.0 pg   MCHC 31.2 30.0 - 36.0 g/dL   RDW 14.0 11.5 - 15.5 %   Platelets 337 150 - 400 K/uL   nRBC 0.0 0.0 - 0.2 %    Comment: Performed at [facility], [place]  [address], [place], [place] [address]  Basic metabolic panel     Status: Abnormal   Collection Time: 09/22/22  5:30 AM  Result Value Ref Range   Sodium 136 135 - 145 mmol/L   Potassium 3.7 3.5 - 5.1 mmol/L   Chloride 100 98 - 111 mmol/L   CO2 28 22 - 32 mmol/L   Glucose, Bld 123 (H) 70 - 99 mg/dL    Comment: Glucose reference range applies only to samples taken after fasting for at least 8 hours.   BUN 20 6 - 20 mg/dL   Creatinine, Ser 1.52 (H) 0.61 - 1.24 mg/dL   Calcium 8.7 (L) 8.9 - 10.3 mg/dL   GFR, Estimated 58 (L) >60 mL/min    Comment: (NOTE) Calculated using the CKD-EPI Creatinine Equation (2021)    Anion gap 8 5 - 15    Comment: Performed at [facility] [address], [place], [place] [address]  CBG monitoring, ED     Status: Abnormal   Collection Time: 09/22/22  7:58 AM  Result Value Ref Range   Glucose-Capillary 246 (H) 70 - 99 mg/dL    Comment: Glucose reference range applies only to samples taken after fasting for at least 8 hours.  CBG monitoring, ED     Status: Abnormal   Collection Time: 09/22/22 11:05 AM  Result Value Ref Range   Glucose-Capillary 144 (H) 70 - 99 mg/dL    Comment: Glucose reference range applies only to samples taken after fasting for at least 8 hours.   DG Chest 2 View  Result Date: 09/21/2022 CLINICAL DATA:  Shortness of breath EXAM: CHEST - 2 VIEW COMPARISON:  June 02, 2022 FINDINGS: The cardiomediastinal silhouette is unchanged and enlarged in contour. No pleural effusion. No pneumothorax. Peribronchial cuffing with diffuse interstitial prominence and bibasilar heterogeneous opacities. Visualized abdomen is unremarkable. Minimal degenerative changes of the thoracic spine. IMPRESSION: Constellation of findings are favored to reflect pulmonary edema with bibasilar  atelectasis. Electronically Signed   By: Valentino Saxon M.D.   On: 09/21/2022 11:03    Pending Labs Unresulted Labs (From admission, onward)    None       Vitals/Pain Today's Vitals   09/22/22 0815 09/22/22 0900 09/22/22 0920 09/22/22 1130  BP: 108/84 (!) 134/121 (!) 118/91 (!) 151/110  Pulse: 99 95 90 97  Resp: (!) 24  19 (!) 21  Temp:   98 F (36.7 C)   TempSrc:   Oral   SpO2: 93%  97% 94%  Weight:      Height:      PainSc:        Isolation Precautions No active isolations  Medications Medications  spironolactone (ALDACTONE) tablet 25 mg (25 mg Oral Given 09/22/22 0929)  furosemide (LASIX) injection 40 mg (40 mg Intravenous Given 09/22/22 0931)  enoxaparin (LOVENOX) injection 40 mg (40 mg Subcutaneous Given 09/21/22 2026)  rosuvastatin (CRESTOR) tablet 10 mg (10 mg Oral Given 09/22/22 0929)  LORazepam (ATIVAN) tablet 1-4 mg (has no administration in time range)  thiamine (VITAMIN B1) tablet 100 mg (100 mg Oral Given 09/22/22 0929)    Or  thiamine (VITAMIN B1) injection 100 mg ( Intravenous See Alternative 1/74/08 1448)  folic acid (FOLVITE) tablet 1 mg (1 mg Oral Given 09/22/22 0930)  multivitamin with minerals tablet 1 tablet (1 tablet Oral Given 09/22/22 0929)  insulin aspart (novoLOG) injection 0-9 Units (1 Units Subcutaneous Given 09/22/22 1109)  acetaminophen (TYLENOL) tablet 650 mg (has no administration in time range)  metoprolol tartrate (LOPRESSOR) injection 5 mg (5 mg Intravenous Given 09/22/22 0651)  furosemide (LASIX) injection 40 mg (40 mg Intravenous Given 09/21/22 1420)  metoprolol tartrate (LOPRESSOR) tablet 25 mg (25 mg Oral Given 09/21/22 1426)  losartan (COZAAR) tablet 50 mg (50 mg Oral Given 09/21/22 1732)  hydrALAZINE (APRESOLINE) injection 20 mg (20 mg Intravenous Given 09/21/22 1732)  traMADol (ULTRAM) tablet 50 mg (50 mg Oral Given 09/21/22 2335)    Mobility walks Low fall risk   Focused Assessments Cardiac Assessment Handoff:  Cardiac Rhythm:  Normal sinus rhythm No results found for: "CKTOTAL", "CKMB", "CKMBINDEX", "TROPONINI" No results found for: "DDIMER" Does the Patient currently have chest pain? No    R Recommendations: See Admitting Provider Note  Report given to:   Additional Notes: Spanish speaking pt

## 2022-09-22 NOTE — Progress Notes (Signed)
Patient went into rapid atrial fibrillation with HR as high as 170s. Fortunately, he is asymptomatic and BP is stable.   Plan to give IV Lopressor now for rate-control, hold ARB to allow room for titration of rate-control agent, consider Eliquis for stroke-prevention if he does not convert shortly (CHADS-VASc is 3 (HTN, DM, CHF)).

## 2022-09-22 NOTE — TOC Initial Note (Signed)
Transition of Care [facility]) - Initial/Assessment Note    Patient Details  Name: Justin [name] MRN: [id number] Date of Birth: [birth year]  Transition of Care [facility]) CM/SW Contact:    Erenest Rasher, RN Phone Number: [phone] 09/22/2022, 6:01 PM  Clinical Narrative:                 HF TOC CM spoke to pt at bedside with [place] interpreter. Pt states he lives with a friend. Reports he has not worked in 3 weeks. He works as a Curator. Reports he did not have a ride to his appts.He does not take the bus. Will assist pt with medications with HF fund/MATCH. Will arrange appt with PCP follow up.   Expected Discharge Plan: Home/Self Care Barriers to Discharge: Continued Medical Work up   Patient Goals and CMS Choice            Expected Discharge Plan and Services   Discharge Planning Services: CM Consult   Living arrangements for the past 2 months: Apartment                                      Prior Living Arrangements/Services Living arrangements for the past 2 months: Apartment Lives with:: Friends Patient language and need for interpreter reviewed:: Yes Do you feel safe going back to the place where you live?: Yes      Need for Family Participation in Patient Care: No (Comment) Care giver support system in place?: No (comment)   Criminal Activity/Legal Involvement Pertinent to Current Situation/Hospitalization: No - Comment as needed  Activities of Daily Living Home Assistive Devices/Equipment: None ADL Screening (condition at time of admission) Patient's cognitive ability adequate to safely complete daily activities?: Yes Is the patient deaf or have difficulty hearing?: No Does the patient have difficulty seeing, even when wearing glasses/contacts?: No Does the patient have difficulty concentrating, remembering, or making decisions?: No Patient able to express need for assistance with ADLs?: Yes Does the patient have difficulty dressing or  bathing?: No Independently performs ADLs?: Yes (appropriate for developmental age) Does the patient have difficulty walking or climbing stairs?: No Weakness of Legs: None Weakness of Arms/Hands: None  Permission Sought/Granted Permission sought to share information with : Case Manager Permission granted to share information with : Yes, Verbal Permission Granted  Share Information with NAME: Mikeal Hawthorne     Permission granted to share info w Relationship: friend     Emotional Assessment Appearance:: Appears stated age Attitude/Demeanor/Rapport: Engaged Affect (typically observed): Accepting Orientation: : Oriented to Self, Oriented to Place, Oriented to  Time, Oriented to Situation   Psych Involvement: No (comment)  Admission diagnosis:  Shortness of breath [R06.02] Hypertensive urgency [I16.0] Acute on chronic congestive heart failure (HCC) [I50.9] Acute on chronic heart failure, unspecified heart failure type [facility]) [I50.9] Patient Active Problem List   Diagnosis Date Noted   Acute on chronic congestive heart failure ([place]) 09/21/2022   Chronic kidney disease, stage II (mild) 09/21/2022   Alcohol abuse 09/21/2022   Class 3 obesity ([place]) 06/04/2022   Acute kidney injury superimposed on chronic kidney disease ([place]) 06/03/2022   Acute on chronic systolic CHF (congestive heart failure) ([place]) 06/02/2022   Type 2 diabetes mellitus with hyperlipidemia ([place]) 06/02/2022   Polysubstance abuse ([place]) 06/02/2022   Ventricular tachycardia ([place])    Primary hypertension    Cardiomyopathy due to hypertension, with heart failure ([place])  Acute systolic CHF (congestive heart failure) (HCC)    Nonrheumatic mitral valve regurgitation    Dyspnea    Community acquired pneumonia    Acute hypoxemic respiratory failure ([place]) 02/09/2022   Hypertriglyceridemia 04/18/2018   Nonischemic cardiomyopathy ([place]) 03/29/2018   Chronic diastolic heart failure ([place]) 03/29/2018   Essential hypertension 03/29/2018    PCP:  Patient, No Pcp Per Pharmacy:   [place], [place] - [address] [address] Phone: [phone] Fax: [phone]  [facility] [address] [place] [address] Phone: [phone] Fax: [phone]     Social Determinants of Health (SDOH) Social History: SDOH Screenings   Food Insecurity: No Food Insecurity (09/22/2022)  Housing: Low Risk  (09/22/2022)  Transportation Needs: Unmet Transportation Needs (09/22/2022)  Utilities: Not At Risk (09/22/2022)  Alcohol Screen: Medium Risk (02/20/2022)  Financial Resource Strain: Medium Risk (06/05/2022)  Tobacco Use: Low Risk  (09/22/2022)   SDOH Interventions:     Readmission Risk Interventions     No data to display

## 2022-09-22 NOTE — Assessment & Plan Note (Addendum)
Patient has converted to sinus rhythm Continue telemetry monitoring.   If he goes back in atrial fibrillation with RVR will need amiodarone drip for rate control.  On metoprolol  25 mg succinate.  Anticoagulation with rivaroxaban.

## 2022-09-22 NOTE — Assessment & Plan Note (Addendum)
Echocardiogram with reduced LV systolic function EF 15 to 20%, global hypokinesis, RV mild dilatation. RV with mild reduction in systolic function. Moderate to severe MR, LA with severe enlargement.   Urine output is 0,258 ml Systolic blood pressure 527-782 and diastolic 90 to 423 mg.   Plan to continue diuresis with IV furosemide bid.  Medical therapy with losartan, metoprolol succinate, and empagliflozin. Patient with persistent elevation in diastolic blood pressure, will add hydralazine for after load reduction.   Acute hypoxemic respiratory failure due to acute cardiogenic pulmonary edema. Continue diuresis and supplemental 02 per [place] to keep 02 saturation 92% or greater.  02 saturation is 91% on room air.

## 2022-09-22 NOTE — ED Notes (Signed)
Hospital Provider, [place], at bedside

## 2022-09-22 NOTE — Progress Notes (Signed)
Heart Failure Navigator Progress Note  Assessed for Heart & Vascular TOC clinic readiness.  Patient does not meet criteria due to Advanced Heart Failure Team patient.   Navigator will sign off at this time.    Petra Dumler, BSN, RN Heart Failure Nurse Navigator Secure Chat Only   

## 2022-09-22 NOTE — Progress Notes (Signed)
   Heart Failure Stewardship Pharmacist Progress Note   PCP: Patient, No Pcp Per PCP-Cardiologist: Buford Dresser, MD    HPI:  42 yo M with PMH of HFrEF, NICM, HTN, T2DM, HLD, and obesity.   Admitted in June 2023 with acute CHF exacerbation. He was diuresed and started on GDMT. EF at that time was 30-35%. Cath showed patent coronary arteries with low filling pressures and borderline cardiac output. Cardiac MRI 6/12 showed EF 29%, asymmetric LV hypertrophy meeting criteria for hypertrophic cardiomyopathy, RV 45%, and recommended cardiac PET for sarcoid.   Admitted again Sept 2023 with acute on chronic HF secondary to medication noncompliance. He was diuresed and discharged with GDMT.  He has not followed up to appts with HF TOC and AHF clinic.  Presented to the ED on 1/11 with LE edema and shortness of breath. Last ECHO 06/02/22 with LVEF 15-20%, G3DD, global hypokinesis, and RV mildly dilated with moderately reduced systolic function.  Current HF Medications: Diuretic: furosemide 40 mg IV daily MRA: spironolactone 25 mg daily  Prior to admission HF Medications: None - has been out of Jardiance, losartan, metoprolol, and spironolactone  Pertinent Lab Values: Serum creatinine 1.52, BUN 20, Potassium 3.7, Sodium 136, BNP 833.3, A1c 7.5   Vital Signs: Weight: 244 lbs (admission weight: 244 lbs) Blood pressure: 120-140/90s  Heart rate: 90-100s  I/O: -1.2L yesterday; net -1.2L  Medication Assistance / Insurance Benefits Check: Does the patient have prescription insurance?  No  Does the patient qualify for medication assistance through manufacturers or grants?   Yes Eligible grants and/or patient assistance programs: Jardiance Medication assistance applications in progress: none  Medication assistance applications approved: Jardiance Approved medication assistance renewals will be completed by: HF clinic  Outpatient Pharmacy:  Prior to admission [facility] Is the patient willing to use [facility] at discharge? Yes Is the patient willing to transition their outpatient pharmacy to utilize a [facility] outpatient pharmacy?   Pending   Assessment: 1. Acute on chronic systolic CHF (LVEF 67-12%), due to NICM. NYHA class III-IV symptoms. - Consider increasing to furosemide 80 mg IV BID. Strict I/Os and daily weights. Keep K>4 and check magnesium  - Consider resuming PTA metoprolol XL 25 mg daily - Consider restarting ARB prior to discharge (cannot get Entresto since he is uninsured and will not qualify for assistance as he is not a citizen) - Continue spironolactone 25 mg daily - Consider resuming Jardiance 10 mg daily   Plan: 1) Medication changes recommended at this time: - Increase IV lasix to 80 mg BID - Restart metoprolol XL and Jardiance 10 mg daily  2) Patient assistance: - Uninsured and not a [place] citizen - Previously was approved for [employer] assistance from 06/09/22-06/09/23. Will educate patient on how to obtain refills from [facility] cares.  3)  Education  - To be completed prior to discharge  Kerby Nora, PharmD, BCPS Heart Failure Stewardship Pharmacist Phone [phone]

## 2022-09-22 NOTE — Progress Notes (Incomplete)
Rounding Note    Patient Name: Justin [name] Date of Encounter: 09/23/2022  [place] Cardiologist: Buford Dresser, MD   Subjective   Breathing improving. No chest pain. Remains in NSR. Brisk response to lasix last night. Blood pressures much better controlled.  Cr 1.52>1.63 Net negative 4.2L   Inpatient Medications    Scheduled Meds:  empagliflozin  10 mg Oral Daily   enoxaparin (LOVENOX) injection  40 mg Subcutaneous [id number]   folic acid  1 mg Oral Daily   furosemide  80 mg Intravenous BID   influenza vac split quadrivalent PF  0.5 mL Intramuscular Tomorrow-1000   insulin aspart  0-9 Units Subcutaneous TID WC   losartan  50 mg Oral Daily   metoprolol succinate  25 mg Oral Daily   multivitamin with minerals  1 tablet Oral Daily   rosuvastatin  10 mg Oral Daily   spironolactone  25 mg Oral Daily   Continuous Infusions:  PRN Meds: acetaminophen, LORazepam, metoprolol tartrate   Vital Signs    Vitals:   09/22/22 1939 09/23/22 0031 09/23/22 0805 09/23/22 0900  BP: (!) 146/96 (!) 142/101 (!) 145/117 (!) 132/96  Pulse:   94   Resp:   20   Temp: 97.7 F (36.5 C) 98.7 F (37.1 C) 98.6 F (37 C)   TempSrc: Oral Oral Oral   SpO2:   98%   Weight:  130.5 kg    Height:        Intake/Output Summary (Last 24 hours) at 09/23/2022 1110 Last data filed at 09/23/2022 1055 Gross per 24 hour  Intake 1320 ml  Output 7575 ml  Net -6255 ml      09/23/2022   12:31 AM 09/21/2022    2:06 PM 06/06/2022   12:20 AM  Last 3 Weights  Weight (lbs) 287 lb 11.2 oz 244 lb 11.4 oz 245 lb 1.6 oz  Weight (kg) 130.5 kg 111 kg 111.177 kg      Telemetry    NSR - Personally Reviewed  ECG    Sinus tachycardia with PACs - Personally Reviewed  Physical Exam   GEN: No acute distress.   Neck: JVD to the ear lobe Cardiac: RRR, no murmurs, rubs, or gallops.  Respiratory: Clear to auscultation bilaterally. GI: Obese, soft, NTTP MS: 1-2+ edema to the knees.  Warm Neuro:  Nonfocal  Psych: Normal affect   Labs    High Sensitivity Troponin:   Recent Labs  Lab 09/21/22 1403 09/21/22 1605  TROPONINIHS 73* 77*     Chemistry Recent Labs  Lab 09/21/22 1047 09/22/22 0530 09/23/22 0033  NA 136 136 138  K 4.0 3.7 3.9  CL 99 100 100  CO2 28 28 28   GLUCOSE 170* 123* 123*  BUN 23* 20 23*  CREATININE 1.51* 1.52* 1.63*  CALCIUM 9.0 8.7* 8.6*  MG  --   --  2.0  GFRNONAA 59* 58* 54*  ANIONGAP 9 8 10     Lipids No results for input(s): "CHOL", "TRIG", "HDL", "LABVLDL", "LDLCALC", "CHOLHDL" in the last 168 hours.  Hematology Recent Labs  Lab 09/21/22 1047 09/22/22 0530  WBC 9.3 8.6  RBC 5.88* 5.48  HGB 16.3 15.4  HCT 53.0* 49.4  MCV 90.1 90.1  MCH 27.7 28.1  MCHC 30.8 31.2  RDW 13.8 14.0  PLT 327 337   Thyroid No results for input(s): "TSH", "FREET4" in the last 168 hours.  BNP Recent Labs  Lab 09/21/22 1047  BNP 833.3*    DDimer No  results for input(s): "DDIMER" in the last 168 hours.   Radiology    No results found.  Cardiac Studies   TTE 05/2022: IMPRESSIONS  1. There is severe concentric hypertrophy of the left ventricle. LV  systolic function is severely reduced with an estimated EF of 15%-20% and  global hypokinesis. Grade III diastolic dysfuncton with echocardiographic  signs of elevated filling pressures.  2. The right ventricle is mildly dilated with moderately reduced systolic  function.  3. There is at least moderate eccentric mitral regurgitation. The MR jet  is likely underestimated due to its eccentricity.  4. The left atrium is severely enlarged.  FINDINGS   Left Ventricle: Left ventricular ejection fraction, by estimation, is  <20%. The left ventricle has severely decreased function. The left  ventricle has no regional wall motion abnormalities. The left ventricular  internal cavity size was normal in size.  There is severe concentric left ventricular hypertrophy. Left ventricular  diastolic  parameters are consistent with Grade III diastolic dysfunction  (restrictive).    Patient Profile     42 y.o. male with history of chronic systolic HF, HTN, DMII, ETOH abuse, cocaine abuse who presented with acute on chronic combined systolic and diastolic HF exacerbation in the setting medication noncompliance and continued cocaine use.   Assessment & Plan    #Acute on chronic combined systolic and diastolic CHF: -EF severely reduced in 2017 in setting of uncontrolled HTN. EF recovered in 2019. -Echo 02/09/22: EF 30-35%, RV okay, mild to moderate MR. -R/LHC 02/16/22: No significant CAD, RA mean 1, PA mean 19, PCWP 3, LVEDP 7, Fick CO/CI 4.17/1.99 -cMRI 02/20/22: LVEF 29%, LV severely dilated, asymmetric LVH with basal septum measuring 17 mm, RVEF 45%, patchy LGE in setpum and RV insertion sites + subepicardial LGE in inferolateral wall. Scar pattern felt to be consistent with HCM. However, d/t subepicardial LGE in inferolateral wall and presence of mediastinal lymphadenopathy, cardiac PET recommended to r/o sarcoid, small pericardial effusion.  - Echo 9/23: EF 15-20%, GIIIDD, RV mildly dilated and mod reduced function, Mod MR, trivial TR -NYHA IV on admission. Volume overloaded.  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  - Continue lasix 80mg  IV BID - Continue Losartan 50 mg daily - Continue metoprolol 25 mg daily  - Continue Jardiance 10mg  daily  - Continue spiro 25 mg daily - Strict I&O, daily weights   #Afib with RVR - Noted on admission but converted back to NSR on 09/22/21 - Continue metoprolol 25mg  XL daily - Will start xarelto for [facility] for now; may be difficult to get as outpatient given lack of insurance and citizenship. Compliance is a major issue and likely not a good warfarin candidate. Favor xarelto due to daily dosing over BID with apixaban   #Uncontrolled  HTN: - Improved with resuming GDMT as detailed above   #Polysubstance abuse: -ETOH and crack/cocaine -Cessation discussed -SW following   #DM II: - A1C 7.8; was not taking meds prior to admission - Management per primary   #CKD II: - Scr averaging 1.3-1.5; bumped slightly with resuming GDMT - Will monitor closely with diuresis   #Hyperlipidemia - Continue crestor 10mg  daily       For questions or updates, please contact [place] Please consult [email or web address] for contact info under        Signed, Freada Bergeron, MD  09/23/2022, 11:10 AM

## 2022-09-22 NOTE — ED Notes (Signed)
Upon assessment, this RN noted that patients HR rhythm had changed into AFIB RVR at a rate of 140-160s BPM. Patient currently denies CP or palpitations. Bedside EKG performed and uploaded to chart. MD made aware. Awaiting orders at this time.

## 2022-09-22 NOTE — ED Notes (Signed)
Patient was in A-fib during the night with previous RN and this RN noticed rhythm change.  EKG change noted and repeat EKG done to be stored in chart.  No new orders at this time.

## 2022-09-22 NOTE — Assessment & Plan Note (Addendum)
CKD stage 3a.   At the time of his discharge his renal function has a serum cr of 1,42 with K at 4,0 and serum bicarbonate at 32.  Na 137 and Cl 95   Continue diuresis with furosemide, spironolactone and empaglyflozin.

## 2022-09-23 LAB — BASIC METABOLIC PANEL
Anion gap: 10 (ref 5–15)
BUN: 23 mg/dL — ABNORMAL HIGH (ref 6–20)
CO2: 28 mmol/L (ref 22–32)
Calcium: 8.6 mg/dL — ABNORMAL LOW (ref 8.9–10.3)
Chloride: 100 mmol/L (ref 98–111)
Creatinine, Ser: 1.63 mg/dL — ABNORMAL HIGH (ref 0.61–1.24)
GFR, Estimated: 54 mL/min — ABNORMAL LOW (ref >60)
Glucose, Bld: 123 mg/dL — ABNORMAL HIGH (ref 70–99)
Potassium: 3.9 mmol/L (ref 3.5–5.1)
Sodium: 138 mmol/L (ref 135–145)

## 2022-09-23 LAB — GLUCOSE, CAPILLARY
Glucose-Capillary: 116 mg/dL — ABNORMAL HIGH (ref 70–99)
Glucose-Capillary: 195 mg/dL — ABNORMAL HIGH (ref 70–99)

## 2022-09-23 LAB — HEMOGLOBIN A1C
Hgb A1c MFr Bld: 7.8 % — ABNORMAL HIGH (ref 4.8–5.6)
Mean Plasma Glucose: 177.16 mg/dL

## 2022-09-23 LAB — MAGNESIUM: Magnesium: 2 mg/dL (ref 1.7–2.4)

## 2022-09-23 MED ORDER — RIVAROXABAN 20 MG PO TABS
20.0000 mg | ORAL_TABLET | Freq: Every day | ORAL | Status: DC
  Administered 2022-09-23 – 2022-09-25 (×3): 20 mg via ORAL
  Filled 2022-09-23 (×3): qty 1

## 2022-09-23 NOTE — Plan of Care (Signed)
  Problem: Education: Goal: Ability to demonstrate management of disease process will improve Outcome: Progressing Goal: Ability to verbalize understanding of medication therapies will improve Outcome: Progressing Goal: Individualized Educational Video(s) Outcome: Progressing   Problem: Activity: Goal: Capacity to carry out activities will improve Outcome: Progressing   Problem: Cardiac: Goal: Ability to achieve and maintain adequate cardiopulmonary perfusion will improve Outcome: Progressing   Problem: Education: Goal: Ability to describe self-care measures that may prevent or decrease complications (Diabetes Survival Skills Education) will improve Outcome: Progressing Goal: Individualized Educational Video(s) Outcome: Progressing   

## 2022-09-23 NOTE — Progress Notes (Signed)
Progress Note   Patient: Justin [name] QQV:[id number] DOB: Sep 11, 1981 DOA: 09/21/2022     2 DOS: the patient was seen and examined on 09/23/2022   Brief hospital course: [name] was admitted to the hospital with the working diagnosis of decompensated heart failure.   42 yo male with the past medical history of heart failure, hypertension, T2DM, CKD and obesity who presented with dyspnea and edema. Patient run out medications for the last 7 days, he developed worsening dyspnea, orthopnea and lower extremity edema. He continue to drink alcohol. On his initial physical examination his blood pressure is 184/136. 02 saturation 80 to 91%, RR 17 and HR 87, lungs with no wheezing or rales, heart with S1 and S2 present and rhythmic, abdomen with no distention and positive lower extremity edema.   Na 136, K 4,0 CL 99, bicarbonate 28, glucose 170 bun 23 cr 1,51 BNP 833 High sensitive troponin 73 and 77  Wbc 9,3 hgb 16.3 plt 327   Chest radiograph with cardiomegaly, bilateral hilar vascular congestion, bilateral interstitial infiltrates.   EKG 111 bpm, right axis, qtc 505, sinus rhythm with pac and pvc, poor R R wave progression, with no significant ST segment or T wave changes.   Patient was placed on IV furosemide for diuresis.   Developed atrial fibrillation with RVR.   01/12 converted to sinus rhythm.  01/13 Patient is diuresing well.   Assessment and Plan: * Acute on chronic systolic CHF (congestive heart failure) (HCC) Echocardiogram with reduced LV systolic function EF 15 to 20%, global hypokinesis, RV mild dilatation. RV with mild reduction in systolic function. Moderate to severe MR, LA with severe enlargement.   Urine output is 3,329 ml Systolic blood pressure 518 and diastolic 90 to 841 mg.   Plan to continue diuresis with IV furosemide 80 mg bid Medical therapy with losartan, empagliflozin and spironolactone.  Patient has been non compliant with outpatient medical  therapy.   Acute hypoxemic respiratory failure due to acute cardiogenic pulmonary edema. Continue diuresis and supplemental 02 per [place] to keep 02 saturation 92% or greater.  Today oxygenation has improved with 02 sat 98% on room air.   Acute on chronic congestive heart failure (HCC) -Acute on chronic combined systolic and diastolic heart failure secondary to medication non-adherent leading to uncontrolled hypertension. 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  Grade 3 diastolic dysfunction.  Severe concentric hypertrophy of the left ventricle -start IV Lasix 40mg  daily for now but likely will need uptitration depending on renal function and output.  Monitor intake and output.  Last admission he had greater than 8 L of negative balance. -Resume losartan-patient not able to afford Entresto due to financial constraints -Continue home spironolactone  Paroxysmal atrial fibrillation with RVR ([place]) Patient has converted to sinus rhythm Continue telemetry monitoring.   If he goes back in atrial fibrillation with RVR will need amiodarone drip for rate control.  Considering his low EF not candidate for AV blockade with diltiazem or metoprolol.  Anticoagulation with rivaroxaban.   Acute hypoxemic respiratory failure (HCC) -secondary to cardiogenic pulmonary edema requiring 2L via [place] -continue to wean as tolerated with IV diuresis -Goal oxygen saturation of greater than 92%  Essential hypertension Continue blood pressure control with losartan Continue aggressive diuresis.  Possible transition to entresto before his discharge.   Type 2 diabetes mellitus with hyperlipidemia (HCC) Fasting glucose has been controlled, today  at 123, capillary 161, 116 and 195.  Plan to discontinue insulin and continue capillary glucose monitoring as needed.   Chronic kidney disease, stage II (mild) CKD  stage 3a.   Improvement in volume status, his follow up renal function with serum cr at 1,63, K is 3,9 and serum bicarbonate at 28. Na 138, Mg 2,0 Plan to continue diuresis and follow up renal function and electrolytes in am.   Alcohol abuse No signs of alcohol withdrawal syndrome.  Continue neuro checks per unit protocol.   Class 3 obesity (HCC) BMI of 40        Subjective: Patient is feeling better, dyspnea and edema have been improving, no chest pain.   Physical Exam: Vitals:   09/23/22 0031 09/23/22 0805 09/23/22 0900 09/23/22 1115  BP: (!) 142/101 (!) 145/117 (!) 132/96 (!) 140/101  Pulse:  94  94  Resp:  20  20  Temp: 98.7 F (37.1 C) 98.6 F (37 C)  98.5 F (36.9 C)  TempSrc: Oral Oral  Oral  SpO2:  98%  98%  Weight: 130.5 kg     Height:       Neurology awake and alert ENT with no pallor Cardiovascular with S1 and S2 present and rhythmic with no gallops, rubs or murmurs No JVD (wide short neck) Positive lower extremity edema ++ to +++ Respiratory with no rales or wheezing on anterior auscultation  Abdomen with no distention  Data Reviewed:    Family Communication: no family at the bedside   Disposition: Status is: Inpatient Remains inpatient appropriate because: IV diuresis for heart failure  Planned Discharge Destination: Home     Author: Tawni Millers, MD 09/23/2022 12:32 PM  For on call review [email or web address].

## 2022-09-23 NOTE — Progress Notes (Signed)
Justin [name] for Xarelto  Indication: atrial fibrillation  No Known Allergies  Patient Measurements: Height: 5\' 5"  (165.1 cm) Weight: 130.5 kg (287 lb 11.2 oz) IBW/kg (Calculated) : 61.5   Vital Signs: Temp: 98.5 F (36.9 C) (01/13 1115) Temp Source: Oral (01/13 1115) BP: 140/101 (01/13 1115) Pulse Rate: 94 (01/13 1115)  Labs: Recent Labs    09/21/22 1047 09/21/22 1403 09/21/22 1605 09/22/22 0530 09/23/22 0033  HGB 16.3  --   --  15.4  --   HCT 53.0*  --   --  49.4  --   PLT 327  --   --  337  --   CREATININE 1.51*  --   --  1.52* 1.63*  TROPONINIHS  --  73* 77*  --   --     Estimated Creatinine Clearance: 74.4 mL/min (A) (by C-G formula based on SCr of 1.63 mg/dL (H)).   Medical History: Past Medical History:  Diagnosis Date   Heart failure with reduced ejection fraction ([place]) 2017   [facility]   Hypertension 2017   [facility]    Assessment: Patient admitted for decompensated heart failure. PMH CKD. Patient developed afib with RVR upon inpatient admission. Patient is not a [place] citizen and has difficulty with getting coverage for/adhering to his medications. Pharmacy consulted to dose Xarelto.   Hgb 15.4 and PLT 337. No s/sx of bleeding in patient chart.   Goal of Therapy:  Monitor platelets by anticoagulation protocol: Yes   Plan:  Start Xarelto 20 mg PO daily  Monitor CBC, trend H&H and monitor for s/sx bleeding Monitor renal function for dose adjustments as necessary F/u with patient assistance and medication education    Gena Fray, PharmD PGY1 Pharmacy Resident   09/23/2022 11:29 AM

## 2022-09-23 NOTE — Progress Notes (Signed)
Rounding Note    Patient Name: Justin [name] Date of Encounter: 09/24/2022  [place] Cardiologist: Buford Dresser, MD   Subjective    Feels better this morning. LE edema and breathing significantly improved.   Cr 1.63>1.7, bicarb rising; had very brisk UOP with negative 8L overnight Wt down 287>272lbs   Inpatient Medications    Scheduled Meds:  empagliflozin  10 mg Oral Daily   furosemide  80 mg Intravenous BID   influenza vac split quadrivalent PF  0.5 mL Intramuscular Tomorrow-1000   losartan  50 mg Oral Daily   metoprolol succinate  25 mg Oral Daily   multivitamin with minerals  1 tablet Oral Daily   rivaroxaban  20 mg Oral Q supper   rosuvastatin  10 mg Oral Daily   spironolactone  25 mg Oral Daily   Continuous Infusions:  PRN Meds: acetaminophen   Vital Signs    Vitals:   09/23/22 2349 09/24/22 0033 09/24/22 0424 09/24/22 0803  BP: (!) 134/101  (!) 142/105 (!) 150/109  Pulse: 93  97 96  Resp: 20  17 18   Temp: 97.9 F (36.6 C)  97.7 F (36.5 C) 97.7 F (36.5 C)  TempSrc: Oral  Oral Oral  SpO2: 94%  91% 91%  Weight:  123.4 kg    Height:        Intake/Output Summary (Last 24 hours) at 09/24/2022 1114 Last data filed at 09/24/2022 0930 Gross per 24 hour  Intake 1200 ml  Output 10450 ml  Net -9250 ml      09/24/2022   12:33 AM 09/23/2022   12:31 AM 09/21/2022    2:06 PM  Last 3 Weights  Weight (lbs) 272 lb 0.8 oz 287 lb 11.2 oz 244 lb 11.4 oz  Weight (kg) 123.4 kg 130.5 kg 111 kg      Telemetry    NSR - Personally Reviewed  ECG    No new tracing - Personally Reviewed  Physical Exam   GEN: No acute distress.   Neck: JVD to mid neck Cardiac: RRR, no murmurs, rubs, or gallops.  Respiratory: Clear to auscultation bilaterally. GI: Obese, soft, NTTP MS: 1+ edema to the mid-shin, warm Neuro:  Nonfocal  Psych: Normal affect   Labs    High Sensitivity Troponin:   Recent Labs  Lab 09/21/22 1403  09/21/22 1605  TROPONINIHS 73* 77*     Chemistry Recent Labs  Lab 09/22/22 0530 09/23/22 0033 09/24/22 0053  NA 136 138 140  K 3.7 3.9 3.8  CL 100 100 96*  CO2 28 28 36*  GLUCOSE 123* 123* 129*  BUN 20 23* 23*  CREATININE 1.52* 1.63* 1.70*  CALCIUM 8.7* 8.6* 8.9  MG  --  2.0 2.1  GFRNONAA 58* 54* 51*  ANIONGAP 8 10 8     Lipids No results for input(s): "CHOL", "TRIG", "HDL", "LABVLDL", "LDLCALC", "CHOLHDL" in the last 168 hours.  Hematology Recent Labs  Lab 09/21/22 1047 09/22/22 0530  WBC 9.3 8.6  RBC 5.88* 5.48  HGB 16.3 15.4  HCT 53.0* 49.4  MCV 90.1 90.1  MCH 27.7 28.1  MCHC 30.8 31.2  RDW 13.8 14.0  PLT 327 337   Thyroid No results for input(s): "TSH", "FREET4" in the last 168 hours.  BNP Recent Labs  Lab 09/21/22 1047  BNP 833.3*    DDimer No results for input(s): "DDIMER" in the last 168 hours.   Radiology    No results found.  Cardiac Studies   TTE 05/2022:  IMPRESSIONS  1. There is severe concentric hypertrophy of the left ventricle. LV  systolic function is severely reduced with an estimated EF of 15%-20% and  global hypokinesis. Grade III diastolic dysfuncton with echocardiographic  signs of elevated filling pressures.  2. The right ventricle is mildly dilated with moderately reduced systolic  function.  3. There is at least moderate eccentric mitral regurgitation. The MR jet  is likely underestimated due to its eccentricity.  4. The left atrium is severely enlarged.  FINDINGS   Left Ventricle: Left ventricular ejection fraction, by estimation, is  <20%. The left ventricle has severely decreased function. The left  ventricle has no regional wall motion abnormalities. The left ventricular  internal cavity size was normal in size.  There is severe concentric left ventricular hypertrophy. Left ventricular  diastolic parameters are consistent with Grade III diastolic dysfunction  (restrictive).    Patient Profile     42 y.o. male with  history of chronic systolic HF, HTN, DMII, ETOH abuse, cocaine abuse who presented with acute on chronic combined systolic and diastolic HF exacerbation in the setting medication noncompliance and continued cocaine use.   Assessment & Plan    #Acute on chronic combined systolic and diastolic CHF: -EF severely reduced in 2017 in setting of uncontrolled HTN. EF recovered in 2019. -Echo 02/09/22: EF 30-35%, RV okay, mild to moderate MR. -R/LHC 02/16/22: No significant CAD, RA mean 1, PA mean 19, PCWP 3, LVEDP 7, Fick CO/CI 4.17/1.99 -cMRI 02/20/22: LVEF 29%, LV severely dilated, asymmetric LVH with basal septum measuring 17 mm, RVEF 45%, patchy LGE in setpum and RV insertion sites + subepicardial LGE in inferolateral wall. Scar pattern felt to be consistent with HCM. However, d/t subepicardial LGE in inferolateral wall and presence of mediastinal lymphadenopathy, cardiac PET recommended to r/o sarcoid, small pericardial effusion.  - Echo 9/23: EF 15-20%, GIIIDD, RV mildly dilated and mod reduced function, Mod MR, trivial TR -NYHA IV on admission. Volume overloaded.  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  - Will hold PM dose of lasix and start back at 40mg  IV BID tomorrow due to very brisk response (-8L overnight) as Cr and bicarb rising - Continue Losartan 50 mg daily - Continue metoprolol 25 mg daily  - Continue Jardiance 10mg  daily  - Hold spiro for now; resume once Cr stabilizes  - Strict I&O, daily weights   #Afib with RVR - Noted on admission but converted back to NSR on 09/22/21 - Continue metoprolol 25mg  XL daily - Started on xarelto for [facility] for now; may be difficult to get as outpatient given lack of insurance and citizenship. Compliance is a major issue and likely not a good warfarin candidate. Favor xarelto due to daily dosing over BID with apixaban   #Uncontrolled  HTN: - Improved with resuming GDMT as detailed above - Will add hydralazine 25mg  TID today given SBP 140-150s; can likely optimize ARB and pull off hydralazine once Cr stabilizes   #Polysubstance abuse: -ETOH and crack/cocaine -Cessation discussed -SW following   #DM II: - A1C 7.8; was not taking meds prior to admission - Management per primary   #CKD IIIA: - Scr averaging 1.3-1.5; bumped to 1.7 in the setting of very brisk diuresis - Will adjust lasix as above   #Hyperlipidemia - Continue crestor 10mg  daily       For questions or updates, please contact [facility] Please consult [email or web address] for contact info under        Signed, Freada Bergeron, MD  09/24/2022, 11:14 AM

## 2022-09-23 NOTE — Progress Notes (Signed)
Orthopedic Tech Progress Note Patient Details:  Justin [name] 1981-01-10 [id number]  Patient ID: Justin [name], male   DOB: 1981-04-22, 42 y.o.   MRN: [id number] When I entered pt rm he said he rather me come back in the morning when he wake up to apply unna boots. Justin [name] 09/23/2022, 1:19 AM

## 2022-09-23 NOTE — Plan of Care (Signed)
Pt remains on 2L [place]. Pt had BM x2. CBG orders discontinued. Pt needs unna boots; ortho tech paged this afternoon.  Problem: Education: Goal: Ability to demonstrate management of disease process will improve Outcome: Progressing Goal: Ability to verbalize understanding of medication therapies will improve Outcome: Progressing Goal: Individualized Educational Video(s) Outcome: Progressing   Problem: Activity: Goal: Capacity to carry out activities will improve Outcome: Progressing   Problem: Cardiac: Goal: Ability to achieve and maintain adequate cardiopulmonary perfusion will improve Outcome: Progressing   Problem: Education: Goal: Ability to describe self-care measures that may prevent or decrease complications (Diabetes Survival Skills Education) will improve Outcome: Progressing Goal: Individualized Educational Video(s) Outcome: Progressing   Problem: Coping: Goal: Ability to adjust to condition or change in health will improve Outcome: Progressing   Problem: Fluid Volume: Goal: Ability to maintain a balanced intake and output will improve Outcome: Progressing   Problem: Health Behavior/Discharge Planning: Goal: Ability to identify and utilize available resources and services will improve Outcome: Progressing Goal: Ability to manage health-related needs will improve Outcome: Progressing   Problem: Metabolic: Goal: Ability to maintain appropriate glucose levels will improve Outcome: Progressing   Problem: Nutritional: Goal: Maintenance of adequate nutrition will improve Outcome: Progressing Goal: Progress toward achieving an optimal weight will improve Outcome: Progressing   Problem: Skin Integrity: Goal: Risk for impaired skin integrity will decrease Outcome: Progressing   Problem: Tissue Perfusion: Goal: Adequacy of tissue perfusion will improve Outcome: Progressing   Problem: Education: Goal: Knowledge of General Education information will  improve Description: Including pain rating scale, medication(s)/side effects and non-pharmacologic comfort measures Outcome: Progressing   Problem: Health Behavior/Discharge Planning: Goal: Ability to manage health-related needs will improve Outcome: Progressing   Problem: Clinical Measurements: Goal: Ability to maintain clinical measurements within normal limits will improve Outcome: Progressing Goal: Will remain free from infection Outcome: Progressing Goal: Diagnostic test results will improve Outcome: Progressing Goal: Respiratory complications will improve Outcome: Progressing Goal: Cardiovascular complication will be avoided Outcome: Progressing   Problem: Activity: Goal: Risk for activity intolerance will decrease Outcome: Progressing   Problem: Nutrition: Goal: Adequate nutrition will be maintained Outcome: Progressing   Problem: Coping: Goal: Level of anxiety will decrease Outcome: Progressing   Problem: Elimination: Goal: Will not experience complications related to bowel motility Outcome: Progressing Goal: Will not experience complications related to urinary retention Outcome: Progressing   Problem: Pain Managment: Goal: General experience of comfort will improve Outcome: Progressing   Problem: Safety: Goal: Ability to remain free from injury will improve Outcome: Progressing   Problem: Skin Integrity: Goal: Risk for impaired skin integrity will decrease Outcome: Progressing

## 2022-09-24 LAB — BASIC METABOLIC PANEL
Anion gap: 8 (ref 5–15)
BUN: 23 mg/dL — ABNORMAL HIGH (ref 6–20)
CO2: 36 mmol/L — ABNORMAL HIGH (ref 22–32)
Calcium: 8.9 mg/dL (ref 8.9–10.3)
Chloride: 96 mmol/L — ABNORMAL LOW (ref 98–111)
Creatinine, Ser: 1.7 mg/dL — ABNORMAL HIGH (ref 0.61–1.24)
GFR, Estimated: 51 mL/min — ABNORMAL LOW (ref >60)
Glucose, Bld: 129 mg/dL — ABNORMAL HIGH (ref 70–99)
Potassium: 3.8 mmol/L (ref 3.5–5.1)
Sodium: 140 mmol/L (ref 135–145)

## 2022-09-24 LAB — MAGNESIUM: Magnesium: 2.1 mg/dL (ref 1.7–2.4)

## 2022-09-24 MED ORDER — HYDRALAZINE HCL 25 MG PO TABS: 25.0000 mg | ORAL_TABLET | Freq: Three times a day (TID) | ORAL | Status: DC

## 2022-09-24 MED ORDER — HYDRALAZINE HCL 25 MG PO TABS
25.0000 mg | ORAL_TABLET | Freq: Three times a day (TID) | ORAL | Status: DC
  Administered 2022-09-24 – 2022-09-25 (×3): 25 mg via ORAL
  Filled 2022-09-24 (×3): qty 1

## 2022-09-24 MED ORDER — FUROSEMIDE 10 MG/ML IJ SOLN
40.0000 mg | Freq: Two times a day (BID) | INTRAMUSCULAR | Status: DC
  Administered 2022-09-25 (×2): 40 mg via INTRAVENOUS
  Filled 2022-09-24 (×2): qty 4

## 2022-09-24 NOTE — Plan of Care (Signed)
Pt started on hydralazine for blood pressure control. Pt gave self a bath. Pt remains on 2L [place].  Problem: Education: Goal: Ability to demonstrate management of disease process will improve Outcome: Progressing Goal: Ability to verbalize understanding of medication therapies will improve Outcome: Progressing Goal: Individualized Educational Video(s) Outcome: Progressing   Problem: Activity: Goal: Capacity to carry out activities will improve Outcome: Progressing   Problem: Cardiac: Goal: Ability to achieve and maintain adequate cardiopulmonary perfusion will improve Outcome: Progressing   Problem: Education: Goal: Ability to describe self-care measures that may prevent or decrease complications (Diabetes Survival Skills Education) will improve Outcome: Progressing Goal: Individualized Educational Video(s) Outcome: Progressing   Problem: Coping: Goal: Ability to adjust to condition or change in health will improve Outcome: Progressing   Problem: Fluid Volume: Goal: Ability to maintain a balanced intake and output will improve Outcome: Progressing   Problem: Health Behavior/Discharge Planning: Goal: Ability to identify and utilize available resources and services will improve Outcome: Progressing Goal: Ability to manage health-related needs will improve Outcome: Progressing   Problem: Metabolic: Goal: Ability to maintain appropriate glucose levels will improve Outcome: Progressing   Problem: Nutritional: Goal: Maintenance of adequate nutrition will improve Outcome: Progressing Goal: Progress toward achieving an optimal weight will improve Outcome: Progressing   Problem: Skin Integrity: Goal: Risk for impaired skin integrity will decrease Outcome: Progressing   Problem: Tissue Perfusion: Goal: Adequacy of tissue perfusion will improve Outcome: Progressing   Problem: Education: Goal: Knowledge of General Education information will improve Description: Including  pain rating scale, medication(s)/side effects and non-pharmacologic comfort measures Outcome: Progressing   Problem: Health Behavior/Discharge Planning: Goal: Ability to manage health-related needs will improve Outcome: Progressing   Problem: Clinical Measurements: Goal: Ability to maintain clinical measurements within normal limits will improve Outcome: Progressing Goal: Diagnostic test results will improve Outcome: Progressing Goal: Respiratory complications will improve Outcome: Progressing Goal: Cardiovascular complication will be avoided Outcome: Progressing   Problem: Activity: Goal: Risk for activity intolerance will decrease Outcome: Progressing   Problem: Safety: Goal: Ability to remain free from injury will improve Outcome: Progressing   Problem: Skin Integrity: Goal: Risk for impaired skin integrity will decrease Outcome: Progressing

## 2022-09-24 NOTE — Progress Notes (Signed)
Progress Note   Patient: Justin [name] [id number] DOB: 07-09-1981 DOA: 09/21/2022     3 DOS: the patient was seen and examined on 09/24/2022   Brief hospital course: Justin [name] was admitted to the hospital with the working diagnosis of decompensated heart failure.   42 yo male with the past medical history of heart failure, hypertension, T2DM, CKD and obesity who presented with dyspnea and edema. Patient run out medications for the last 7 days, he developed worsening dyspnea, orthopnea and lower extremity edema. He continue to drink alcohol. On his initial physical examination his blood pressure is 184/136. 02 saturation 80 to 91%, RR 17 and HR 87, lungs with no wheezing or rales, heart with S1 and S2 present and rhythmic, abdomen with no distention and positive lower extremity edema.   Na 136, K 4,0 CL 99, bicarbonate 28, glucose 170 bun 23 cr 1,51 BNP 833 High sensitive troponin 73 and 77  Wbc 9,3 hgb 16.3 plt 327   Chest radiograph with cardiomegaly, bilateral hilar vascular congestion, bilateral interstitial infiltrates.   EKG 111 bpm, right axis, qtc 505, sinus rhythm with pac and pvc, poor R R wave progression, with no significant ST segment or T wave changes.   Patient was placed on IV furosemide for diuresis.   Developed atrial fibrillation with RVR.   01/12 converted to sinus rhythm.  01/13 Patient is diuresing well.  01/14 continue diuresing, will need outpatient follow up and assistance with his medications.   Assessment and Plan: * Acute on chronic systolic CHF (congestive heart failure) (HCC) Echocardiogram with reduced LV systolic function EF 15 to 20%, global hypokinesis, RV mild dilatation. RV with mild reduction in systolic function. Moderate to severe MR, LA with severe enlargement.   Urine output is 9,562 ml Systolic blood pressure 130-865 and diastolic 90 to 784 mg.   Plan to continue diuresis with IV furosemide bid.  Medical therapy with  losartan, metoprolol succinate, and empagliflozin. Patient with persistent elevation in diastolic blood pressure, will add hydralazine for after load reduction.   Acute hypoxemic respiratory failure due to acute cardiogenic pulmonary edema. Continue diuresis and supplemental 02 per [place] to keep 02 saturation 92% or greater.  02 saturation is 91% on room air.   Acute on chronic congestive heart failure (HCC) -Acute on chronic combined systolic and diastolic heart failure secondary to medication non-adherent leading to uncontrolled hypertension. 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  Grade 3 diastolic dysfunction.  Severe concentric hypertrophy of the left ventricle -start IV Lasix 40mg  daily for now but likely will need uptitration depending on renal function and output.  Monitor intake and output.  Last admission he had greater than 8 L of negative balance. -Resume losartan-patient not able to afford Entresto due to financial constraints -Continue home spironolactone  Paroxysmal atrial fibrillation with RVR ([place]) Patient has converted to sinus rhythm Continue telemetry monitoring.   Continue rate control with metoprolol  25 mg succinate.  Anticoagulation with rivaroxaban.   Acute hypoxemic respiratory failure (HCC) -secondary to cardiogenic pulmonary edema requiring 2L via [place] -continue to wean as tolerated with IV diuresis -Goal oxygen saturation of greater than 92%  Essential hypertension Continue blood pressure control with losartan Continue aggressive diuresis.  Possible transition to entresto before his discharge.  Added hydralazine for after load reduction.   Type 2 diabetes mellitus with hyperlipidemia (HCC) Glucose has been stable and  patient is tolerating po well.   Chronic kidney disease, stage II (mild) CKD stage 3a.   Renal function with serum cr at 1,70  with K at 3,8 and serum bicarbonate at 36. Continue diuresis with furosemide and SGLT 2 inh Holding spironolactone for now.  Alcohol abuse No signs of alcohol withdrawal syndrome.  Continue neuro checks per unit protocol.   Class 3 obesity (HCC) BMI of 40        Subjective: Patient with no chest pain, dyspnea and edema are improving but not back to his baseline   Physical Exam: Vitals:   09/24/22 0803 09/24/22 1114 09/24/22 1235 09/24/22 1540  BP: (!) 150/109 (!) 139/110 (!) 144/99 (!) 155/102  Pulse: 96 90  86  Resp: 18 18  18   Temp: 97.7 F (36.5 C) 97.7 F (36.5 C)  97.7 F (36.5 C)  TempSrc: Oral Oral  Oral  SpO2: 91% 97%  91%  Weight:      Height:       Neurology awake and alert ENT with mild pallor Cardiovascular with S1 and S2 present and rhythmic with no gallops Respiratory with mild rales at bases with no wheezing or rhonchi Abdomen with no distention  Lower extremity edema ++  Data Reviewed:    Family Communication: no family at the bedside   Disposition: Status is: Inpatient Remains inpatient appropriate because: IV diuretic therapy   Planned Discharge Destination: Home    Author: Tawni Millers, MD 09/24/2022 4:06 PM  For on call review [email or web address].

## 2022-09-24 NOTE — Plan of Care (Signed)
  Problem: Clinical Measurements: Goal: Will remain free from infection Outcome: Completed/Met   Problem: Nutrition: Goal: Adequate nutrition will be maintained Outcome: Completed/Met   Problem: Coping: Goal: Level of anxiety will decrease Outcome: Completed/Met   Problem: Elimination: Goal: Will not experience complications related to bowel motility Outcome: Completed/Met Goal: Will not experience complications related to urinary retention Outcome: Completed/Met   Problem: Pain Managment: Goal: General experience of comfort will improve Outcome: Completed/Met   

## 2022-09-25 ENCOUNTER — Other Ambulatory Visit (HOSPITAL_COMMUNITY): Payer: Self-pay

## 2022-09-25 LAB — BASIC METABOLIC PANEL
Anion gap: 8 (ref 5–15)
BUN: 22 mg/dL — ABNORMAL HIGH (ref 6–20)
CO2: 37 mmol/L — ABNORMAL HIGH (ref 22–32)
Calcium: 9.1 mg/dL (ref 8.9–10.3)
Chloride: 95 mmol/L — ABNORMAL LOW (ref 98–111)
Creatinine, Ser: 1.7 mg/dL — ABNORMAL HIGH (ref 0.61–1.24)
GFR, Estimated: 51 mL/min — ABNORMAL LOW (ref >60)
Glucose, Bld: 130 mg/dL — ABNORMAL HIGH (ref 70–99)
Potassium: 4 mmol/L (ref 3.5–5.1)
Sodium: 140 mmol/L (ref 135–145)

## 2022-09-25 LAB — MAGNESIUM: Magnesium: 2.3 mg/dL (ref 1.7–2.4)

## 2022-09-25 MED ORDER — HYDRALAZINE HCL 25 MG PO TABS
37.5000 mg | ORAL_TABLET | Freq: Three times a day (TID) | ORAL | Status: DC
  Administered 2022-09-25 – 2022-09-26 (×3): 37.5 mg via ORAL
  Filled 2022-09-25 (×3): qty 2

## 2022-09-25 MED ORDER — HYDRALAZINE HCL 25 MG PO TABS
25.0000 mg | ORAL_TABLET | Freq: Four times a day (QID) | ORAL | Status: DC | PRN
  Administered 2022-09-25 – 2022-09-26 (×2): 25 mg via ORAL
  Filled 2022-09-25 (×2): qty 1

## 2022-09-25 NOTE — Plan of Care (Signed)
  Problem: Nutritional: Goal: Maintenance of adequate nutrition will improve Outcome: Completed/Met   Problem: Skin Integrity: Goal: Risk for impaired skin integrity will decrease Outcome: Completed/Met   Problem: Skin Integrity: Goal: Risk for impaired skin integrity will decrease Outcome: Completed/Met

## 2022-09-25 NOTE — Plan of Care (Signed)
  Problem: Education: Goal: Ability to demonstrate management of disease process will improve Outcome: Progressing Goal: Ability to verbalize understanding of medication therapies will improve Outcome: Progressing Goal: Individualized Educational Video(s) Outcome: Progressing   Problem: Activity: Goal: Capacity to carry out activities will improve Outcome: Progressing   Problem: Cardiac: Goal: Ability to achieve and maintain adequate cardiopulmonary perfusion will improve Outcome: Progressing   Problem: Education: Goal: Ability to describe self-care measures that may prevent or decrease complications (Diabetes Survival Skills Education) will improve Outcome: Progressing Goal: Individualized Educational Video(s) Outcome: Progressing   Problem: Coping: Goal: Ability to adjust to condition or change in health will improve Outcome: Progressing   Problem: Fluid Volume: Goal: Ability to maintain a balanced intake and output will improve Outcome: Progressing   Problem: Health Behavior/Discharge Planning: Goal: Ability to identify and utilize available resources and services will improve Outcome: Progressing Goal: Ability to manage health-related needs will improve Outcome: Progressing   Problem: Metabolic: Goal: Ability to maintain appropriate glucose levels will improve Outcome: Progressing   Problem: Nutritional: Goal: Progress toward achieving an optimal weight will improve Outcome: Progressing   Problem: Tissue Perfusion: Goal: Adequacy of tissue perfusion will improve Outcome: Progressing   Problem: Education: Goal: Knowledge of General Education information will improve Description: Including pain rating scale, medication(s)/side effects and non-pharmacologic comfort measures Outcome: Progressing   Problem: Health Behavior/Discharge Planning: Goal: Ability to manage health-related needs will improve Outcome: Progressing   Problem: Clinical Measurements: Goal:  Ability to maintain clinical measurements within normal limits will improve Outcome: Progressing Goal: Diagnostic test results will improve Outcome: Progressing Goal: Respiratory complications will improve Outcome: Progressing Goal: Cardiovascular complication will be avoided Outcome: Progressing   Problem: Activity: Goal: Risk for activity intolerance will decrease Outcome: Progressing   Problem: Safety: Goal: Ability to remain free from injury will improve Outcome: Progressing

## 2022-09-25 NOTE — Progress Notes (Signed)
Orthopedic Tech Progress Note Patient Details:  Justin [name] 1981/09/11 [id number]  Ortho Devices Type of Ortho Device: Louretta [place] boot Ortho Device/Splint Location: Bi LE Ortho Device/Splint Interventions: Application, Adjustment      Linus Salmons Vicky Schleich 09/25/2022, 2:49 PM

## 2022-09-25 NOTE — Progress Notes (Signed)
Progress Note   Patient: Hamed Debella [id number] DOB: 1981-08-27 DOA: 09/21/2022     4 DOS: the patient was seen and examined on 09/25/2022   Brief hospital course: Mr. Holte was admitted to the hospital with the working diagnosis of decompensated heart failure.   42 yo male with the past medical history of heart failure, hypertension, T2DM, CKD and obesity who presented with dyspnea and edema. Patient run out medications for the last 7 days, he developed worsening dyspnea, orthopnea and lower extremity edema. He continue to drink alcohol. On his initial physical examination his blood pressure is 184/136. 02 saturation 80 to 91%, RR 17 and HR 87, lungs with no wheezing or rales, heart with S1 and S2 present and rhythmic, abdomen with no distention and positive lower extremity edema.   Na 136, K 4,0 CL 99, bicarbonate 28, glucose 170 bun 23 cr 1,51 BNP 833 High sensitive troponin 73 and 77  Wbc 9,3 hgb 16.3 plt 327   Chest radiograph with cardiomegaly, bilateral hilar vascular congestion, bilateral interstitial infiltrates.   EKG 111 bpm, right axis, qtc 505, sinus rhythm with pac and pvc, poor R R wave progression, with no significant ST segment or T wave changes.   Patient was placed on IV furosemide for diuresis.   Developed atrial fibrillation with RVR.   01/12 converted to sinus rhythm.  01/13 Patient is diuresing well.  01/14 continue diuresing, will need outpatient follow up and assistance with his medications.  01/15 diuresing well.   Assessment and Plan: * Acute on chronic systolic CHF (congestive heart failure) (HCC) Echocardiogram with reduced LV systolic function EF 15 to 20%, global hypokinesis, RV mild dilatation. RV with mild reduction in systolic function. Moderate to severe MR, LA with severe enlargement.   Urine output is 8,546 ml Systolic blood pressure 270-350 and diastolic 093 to 818 mg.   Diuresis with IV furosemide bid.  Medical therapy with  losartan, metoprolol succinate, and empagliflozin. On hydralazine for after load reduction.   Acute hypoxemic respiratory failure due to acute cardiogenic pulmonary edema. Continue diuresis and supplemental 02 per [place] to keep 02 saturation 92% or greater.  02 saturation is 91% on room air.   Acute on chronic congestive heart failure (HCC) -Acute on chronic combined systolic and diastolic heart failure secondary to medication non-adherent leading to uncontrolled hypertension. 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  Grade 3 diastolic dysfunction.  Severe concentric hypertrophy of the left ventricle -start IV Lasix 40mg  daily for now but likely will need uptitration depending on renal function and output.  Monitor intake and output.  Last admission he had greater than 8 L of negative balance. -Resume losartan-patient not able to afford Entresto due to financial constraints -Continue home spironolactone  Paroxysmal atrial fibrillation with RVR ([place]) Patient has converted to sinus rhythm Continue telemetry monitoring.   Continue rate control with metoprolol  25 mg succinate.  Anticoagulation with rivaroxaban.   Acute hypoxemic respiratory failure (HCC) -secondary to cardiogenic pulmonary edema requiring 2L via [place] -continue to wean as tolerated with IV diuresis -Goal oxygen saturation of greater than 92%  Essential hypertension Continue blood pressure control with losartan Continue aggressive diuresis.  Possible transition to entresto before his discharge.  Increased dose of hydralazine for after load reduction.   Type 2 diabetes mellitus with hyperlipidemia (HCC) Glucose has been stable and patient is tolerating po well.  Chronic kidney disease, stage II (mild) CKD stage 3a.   Renal function stable from yesterday with serum cr at 1,7 with k at 4,0 and serum bicarbonate at  37. Na 140, Mg 2.3   Plan to continue diuresis with furosemide and follow up renal function in am.   Alcohol abuse No signs of alcohol withdrawal syndrome.  Continue neuro checks per unit protocol.   Class 3 obesity (HCC) BMI of 40        Subjective: Patient with improvement in dyspnea and edema no chest pain   Physical Exam: Vitals:   09/25/22 0534 09/25/22 0558 09/25/22 0717 09/25/22 1419  BP: (!) 163/109  (!) 145/116 (!) 147/109  Pulse:   94   Resp: (!) 33 20 (!) 22   Temp: 98.4 F (36.9 C)  98 F (36.7 C)   TempSrc: Oral  Oral   SpO2:   91%   Weight: 118.1 kg     Height:       Neurology awake and alert ENT with no pallor Cardiovascular with S1 and S2 present and rhythmic, no gallops, rubs or murmurs No JVD Lower extremity edema +  Respiratory with mild rales at bases, no wheezing Abdomen with no distention  Data Reviewed:    Family Communication: no family at the bedside   Disposition: Status is: Inpatient Remains inpatient appropriate because: heart failure, on IV diuresis, possible dc home tomorrow   Planned Discharge Destination: Home    Author: Tawni Millers, MD 09/25/2022 3:03 PM  For on call review [email or web address].

## 2022-09-25 NOTE — Progress Notes (Addendum)
Advanced Heart Failure Rounding Note  PCP-Cardiologist: Jodelle Red, MD   Subjective:    Over the weekend diuresed 14 L UOP. Net -17L. Down 24lbs  Feels fine. Denies CP/SOB   Objective:   Weight Range: 118.1 kg Body mass index is 43.33 kg/m.   Vital Signs:   Temp:  [97.7 F (36.5 C)-98.4 F (36.9 C)] 98 F (36.7 C) (01/15 0717) Pulse Rate:  [86-102] 94 (01/15 0717) Resp:  [18-33] 22 (01/15 0717) BP: (139-163)/(99-116) 145/116 (01/15 0717) SpO2:  [91 %-98 %] 91 % (01/15 0717) Weight:  [118.1 kg] 118.1 kg (01/15 0534) Last BM Date : 09/23/22  Weight change: Filed Weights   09/23/22 0031 09/24/22 0033 09/25/22 0534  Weight: 130.5 kg 123.4 kg 118.1 kg    Intake/Output:   Intake/Output Summary (Last 24 hours) at 09/25/2022 0834 Last data filed at 09/25/2022 0300 Gross per 24 hour  Intake 480 ml  Output 2800 ml  Net -2320 ml      Physical Exam    General:  well appearing.  No respiratory difficulty HEENT: normal Neck: supple. JVD ~12 cm. Carotids 2+ bilat; no bruits. No lymphadenopathy or thyromegaly appreciated. Cor: PMI nondisplaced. Regular rate & rhythm. No rubs, gallops or murmurs. Lungs: clear Abdomen: soft, nontender, nondistended. No hepatosplenomegaly. No bruits or masses. Good bowel sounds. Extremities: no cyanosis, clubbing, rash, non-pitting BLE edema  Neuro: alert & oriented x 3, cranial nerves grossly intact. moves all 4 extremities w/o difficulty. Affect pleasant.    Telemetry   NSR 80s, brief NSVT yesterday, asymptomatic (Personally reviewed)    Labs    CBC No results for input(s): "WBC", "NEUTROABS", "HGB", "HCT", "MCV", "PLT" in the last 72 hours. Basic Metabolic Panel Recent Labs    62/95/28 0053 09/25/22 0040  NA 140 140  K 3.8 4.0  CL 96* 95*  CO2 36* 37*  GLUCOSE 129* 130*  BUN 23* 22*  CREATININE 1.70* 1.70*  CALCIUM 8.9 9.1  MG 2.1 2.3   Liver Function Tests No results for input(s): "AST", "ALT",  "ALKPHOS", "BILITOT", "PROT", "ALBUMIN" in the last 72 hours. No results for input(s): "LIPASE", "AMYLASE" in the last 72 hours. Cardiac Enzymes No results for input(s): "CKTOTAL", "CKMB", "CKMBINDEX", "TROPONINI" in the last 72 hours.  BNP: BNP (last 3 results) Recent Labs    02/17/22 0611 06/02/22 0322 09/21/22 1047  BNP 193.8* 551.9* 833.3*    ProBNP (last 3 results) No results for input(s): "PROBNP" in the last 8760 hours.   D-Dimer No results for input(s): "DDIMER" in the last 72 hours. Hemoglobin A1C Recent Labs    09/23/22 0033  HGBA1C 7.8*   Fasting Lipid Panel No results for input(s): "CHOL", "HDL", "LDLCALC", "TRIG", "CHOLHDL", "LDLDIRECT" in the last 72 hours. Thyroid Function Tests No results for input(s): "TSH", "T4TOTAL", "T3FREE", "THYROIDAB" in the last 72 hours.  Invalid input(s): "FREET3"  Other results:   Imaging    No results found.   Medications:     Scheduled Medications:  empagliflozin  10 mg Oral Daily   furosemide  40 mg Intravenous BID   hydrALAZINE  25 mg Oral Q8H   influenza vac split quadrivalent PF  0.5 mL Intramuscular Tomorrow-1000   losartan  50 mg Oral Daily   metoprolol succinate  25 mg Oral Daily   multivitamin with minerals  1 tablet Oral Daily   rivaroxaban  20 mg Oral Q supper   rosuvastatin  10 mg Oral Daily    Infusions:   PRN Medications:  acetaminophen, hydrALAZINE    Patient Profile   Justin [name] is a 42 y.o. Spanish-speaking male with history of chronic systolic CHF, hx uncontrolled HTN, DM II, mitral regurgitation, ETOH abuse, hx "crack"/cocaine use. AHF team asked to see for acute on chronic combined systolic and diastolic heart failure.   Assessment/Plan  Acute on chronic combined systolic and diastolic CHF: -EF severely reduced in 2017 in setting of uncontrolled HTN. EF recovered in 2019. -Echo 02/09/22: EF 30-35%, RV okay, mild to moderate MR. -R/LHC 02/16/22: No significant CAD, RA  mean 1, PA mean 19, PCWP 3, LVEDP 7, Fick CO/CI 4.17/1.99 -cMRI 02/20/22: LVEF 29%, LV severely dilated, asymmetric LVH with basal septum measuring 17 mm, RVEF 45%, patchy LGE in setpum and RV insertion sites + subepicardial LGE in inferolateral wall. Scar pattern felt to be consistent with HCM. However, d/t subepicardial LGE in inferolateral wall and presence of mediastinal lymphadenopathy, cardiac PET recommended to r/o sarcoid, small pericardial effusion.  - Echo 9/23: EF 15-20%, GIIIDD, RV mildly dilated and mod reduced function, Mod MR, trivial TR -NYHA IV on admission. Volume overloaded.  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  - Volume remains slightly elevated. Currently on 40 IV lasix BID (PM dose held). Over the weekend diuresed 14 L UOP. Net -17L. Continue at that dose for now. - Continue Losartan 50 mg daily - Continue metoprolol 25 mg daily  - Continue Jardiance 10mg  daily  - Spiro on hold with slight bump in Cr.  - Place UNNA boots  - Strict I&O, daily weights   2. Afib RVR  - Asymptomatic, rates into 170s while in ED - back in NSR - Now on Xarelto, will discuss with PharmD on how he will get medication once d/c's   3. Uncontrolled HTN: - Up to 180's on admission - Increase hydralazine 25>37.5 TID - Continue GDMT as above   4. Polysubstance abuse: -ETOH and crack/cocaine -Cessation discussed -Consult HF CSW to assist with resources, previously consulted during last admission and failed to reply multiple times. Will reattempt.    5. DM II: - A1c 7.8 - Not taking any meds PTA - per primary   6. CKD II: - Scr averaging 1.3-1.5 - 1.7 today   7. Hyperlipidemia - LDL 173 9/23, continue statin  Length of Stay: [place], NP  09/25/2022, 8:34 AM  Advanced Heart Failure Team Pager [phone] (M-F; 7a - 5p)  Please contact [facility] for  night-coverage after hours (5p -7a ) and weekends on [email or web address]  Patient seen and examined with the above-signed Advanced Practice Provider and/or Housestaff. I personally reviewed laboratory data, imaging studies and relevant notes. I independently examined the patient and formulated the important aspects of the plan. I have edited the note to reflect any of my changes or salient points. I have personally discussed the plan with the patient and/or family.  Feels good. Diuresing very well. Breathing improved. No orthopnea or PND. SCr stable.   General: Sitting up on side of bed eating lunch No resp difficulty HEENT: normal Neck: supple. JVP 7-8 Carotids 2+ bilat; no bruits. No lymphadenopathy or thryomegaly appreciated. Cor: PMI nondisplaced. Regular rate & rhythm. No rubs, gallops or murmurs. Lungs: clear Abdomen: soft, nontender, nondistended. No hepatosplenomegaly. No bruits or masses. Good bowel sounds. Extremities: no cyanosis, clubbing, rash, 1+ edema Neuro: alert & orientedx3, cranial nerves grossly  intact. moves all 4 extremities w/o difficulty. Affect pleasant  Continue IV diuresis one more day. Follow renal function. Potentially home tomorrow.   Glori Bickers, MD  12:37 PM

## 2022-09-25 NOTE — TOC Progression Note (Signed)
Transition of Care [facility]) - Progression Note    Patient Details  Name: Justin [name] MRN: [id number] Date of Birth: 11/15/1980  [facility]) CM/SW Contact  Zenon Mayo, RN Phone Number: 09/25/2022, 3:18 PM  Clinical Narrative:     has follow up on AVS, may need ast with meds at dc, HF, conts on iv lasix, from home.  TOC following.   Expected Discharge Plan: Home/Self Care Barriers to Discharge: Continued Medical Work up  Expected Discharge Plan and Services   Discharge Planning Services: CM Consult   Living arrangements for the past 2 months: Apartment                                       Social Determinants of Health (SDOH) Interventions SDOH Screenings   Food Insecurity: No Food Insecurity (09/22/2022)  Housing: Low Risk  (09/22/2022)  Transportation Needs: Unmet Transportation Needs (09/22/2022)  Utilities: Not At Risk (09/22/2022)  Alcohol Screen: Medium Risk (02/20/2022)  Financial Resource Strain: Medium Risk (06/05/2022)  Tobacco Use: Low Risk  (09/22/2022)    Readmission Risk Interventions     No data to display

## 2022-09-26 ENCOUNTER — Other Ambulatory Visit (HOSPITAL_COMMUNITY): Payer: Self-pay

## 2022-09-26 LAB — BASIC METABOLIC PANEL
Anion gap: 10 (ref 5–15)
BUN: 22 mg/dL — ABNORMAL HIGH (ref 6–20)
CO2: 32 mmol/L (ref 22–32)
Calcium: 9.3 mg/dL (ref 8.9–10.3)
Chloride: 95 mmol/L — ABNORMAL LOW (ref 98–111)
Creatinine, Ser: 1.42 mg/dL — ABNORMAL HIGH (ref 0.61–1.24)
GFR, Estimated: >60 mL/min (ref >60)
Glucose, Bld: 135 mg/dL — ABNORMAL HIGH (ref 70–99)
Potassium: 4 mmol/L (ref 3.5–5.1)
Sodium: 137 mmol/L (ref 135–145)

## 2022-09-26 LAB — MAGNESIUM: Magnesium: 2.2 mg/dL (ref 1.7–2.4)

## 2022-09-26 MED ORDER — SACUBITRIL-VALSARTAN 49-51 MG PO TABS
1.0000 | ORAL_TABLET | Freq: Two times a day (BID) | ORAL | Status: DC
  Administered 2022-09-26: 1 via ORAL
  Filled 2022-09-26: qty 1

## 2022-09-26 MED ORDER — METOPROLOL SUCCINATE ER 25 MG PO TB24
25.0000 mg | ORAL_TABLET | Freq: Every day | ORAL | 0 refills | Status: DC
  Filled 2022-09-26: qty 30, 30d supply, fill #0

## 2022-09-26 MED ORDER — HYDRALAZINE HCL 50 MG PO TABS: 50.0000 mg | ORAL_TABLET | Freq: Three times a day (TID) | ORAL | Status: DC

## 2022-09-26 MED ORDER — EMPAGLIFLOZIN 10 MG PO TABS
10.0000 mg | ORAL_TABLET | Freq: Every day | ORAL | 0 refills | Status: DC
  Filled 2022-09-26: qty 30, 30d supply, fill #0

## 2022-09-26 MED ORDER — RIVAROXABAN 20 MG PO TABS
20.0000 mg | ORAL_TABLET | Freq: Every day | ORAL | 0 refills | Status: DC
  Filled 2022-09-26: qty 30, 30d supply, fill #0

## 2022-09-26 MED ORDER — FUROSEMIDE 40 MG PO TABS
40.0000 mg | ORAL_TABLET | Freq: Every day | ORAL | Status: DC
  Administered 2022-09-26: 40 mg via ORAL
  Filled 2022-09-26: qty 1

## 2022-09-26 MED ORDER — SPIRONOLACTONE 12.5 MG HALF TABLET
12.5000 mg | ORAL_TABLET | Freq: Every day | ORAL | Status: DC
  Administered 2022-09-26: 12.5 mg via ORAL
  Filled 2022-09-26: qty 1

## 2022-09-26 MED ORDER — FUROSEMIDE 40 MG PO TABS
40.0000 mg | ORAL_TABLET | Freq: Every day | ORAL | 0 refills | Status: DC
  Filled 2022-09-26: qty 30, 30d supply, fill #0

## 2022-09-26 MED ORDER — ROSUVASTATIN CALCIUM 10 MG PO TABS
10.0000 mg | ORAL_TABLET | Freq: Every day | ORAL | 0 refills | Status: DC
  Filled 2022-09-26: qty 30, 30d supply, fill #0

## 2022-09-26 MED ORDER — SPIRONOLACTONE 25 MG PO TABS
12.5000 mg | ORAL_TABLET | Freq: Every day | ORAL | 0 refills | Status: DC
  Filled 2022-09-26: qty 15, 30d supply, fill #0

## 2022-09-26 MED ORDER — SACUBITRIL-VALSARTAN 49-51 MG PO TABS
1.0000 | ORAL_TABLET | Freq: Two times a day (BID) | ORAL | 0 refills | Status: DC
  Filled 2022-09-26: qty 60, 30d supply, fill #0

## 2022-09-26 NOTE — Progress Notes (Addendum)
Advanced Heart Failure Rounding Note  PCP-Cardiologist: Buford Dresser, MD   Subjective:    Over the weekend diuresed 14 L UOP. Net -17L. Down 24lbs  Feels fine. Denies CP/SOB   Objective:   Weight Range: 114.2 kg Body mass index is 41.9 kg/m.   Vital Signs:   Temp:  [97.8 F (36.6 C)-98.1 F (36.7 C)] 97.8 F (36.6 C) (01/16 0757) Pulse Rate:  [82-98] 98 (01/16 0757) Resp:  [16-23] 23 (01/16 0757) BP: (143-156)/(99-119) 147/99 (01/16 0757) SpO2:  [83 %-92 %] 92 % (01/16 0757) Weight:  [114.2 kg] 114.2 kg (01/16 0514) Last BM Date : 09/23/22  Weight change: Filed Weights   09/24/22 0033 09/25/22 0534 09/26/22 0514  Weight: 123.4 kg 118.1 kg 114.2 kg    Intake/Output:   Intake/Output Summary (Last 24 hours) at 09/26/2022 0828 Last data filed at 09/26/2022 0758 Gross per 24 hour  Intake 835 ml  Output 4555 ml  Net -3720 ml      Physical Exam    General:  well appearing.  No respiratory difficulty HEENT: normal Neck: supple. JVD ~12 cm. Carotids 2+ bilat; no bruits. No lymphadenopathy or thyromegaly appreciated. Cor: PMI nondisplaced. Regular rate & rhythm. No rubs, gallops or murmurs. Lungs: clear Abdomen: soft, nontender, nondistended. No hepatosplenomegaly. No bruits or masses. Good bowel sounds. Extremities: no cyanosis, clubbing, rash, non-pitting BLE edema  Neuro: alert & oriented x 3, cranial nerves grossly intact. moves all 4 extremities w/o difficulty. Affect pleasant.    Telemetry   NSR 80s, brief NSVT yesterday, asymptomatic (Personally reviewed)    Labs    CBC No results for input(s): "WBC", "NEUTROABS", "HGB", "HCT", "MCV", "PLT" in the last 72 hours. Basic Metabolic Panel Recent Labs    09/25/22 0040 09/26/22 0048  NA 140 137  K 4.0 4.0  CL 95* 95*  CO2 37* 32  GLUCOSE 130* 135*  BUN 22* 22*  CREATININE 1.70* 1.42*  CALCIUM 9.1 9.3  MG 2.3 2.2   Liver Function Tests No results for input(s): "AST", "ALT",  "ALKPHOS", "BILITOT", "PROT", "ALBUMIN" in the last 72 hours. No results for input(s): "LIPASE", "AMYLASE" in the last 72 hours. Cardiac Enzymes No results for input(s): "CKTOTAL", "CKMB", "CKMBINDEX", "TROPONINI" in the last 72 hours.  BNP: BNP (last 3 results) Recent Labs    02/17/22 0611 06/02/22 0322 09/21/22 1047  BNP 193.8* 551.9* 833.3*    ProBNP (last 3 results) No results for input(s): "PROBNP" in the last 8760 hours.   D-Dimer No results for input(s): "DDIMER" in the last 72 hours. Hemoglobin A1C No results for input(s): "HGBA1C" in the last 72 hours.  Fasting Lipid Panel No results for input(s): "CHOL", "HDL", "LDLCALC", "TRIG", "CHOLHDL", "LDLDIRECT" in the last 72 hours. Thyroid Function Tests No results for input(s): "TSH", "T4TOTAL", "T3FREE", "THYROIDAB" in the last 72 hours.  Invalid input(s): "FREET3"  Other results:   Imaging    No results found.   Medications:     Scheduled Medications:  empagliflozin  10 mg Oral Daily   furosemide  40 mg Intravenous BID   hydrALAZINE  37.5 mg Oral Q8H   losartan  50 mg Oral Daily   metoprolol succinate  25 mg Oral Daily   multivitamin with minerals  1 tablet Oral Daily   rivaroxaban  20 mg Oral Q supper   rosuvastatin  10 mg Oral Daily    Infusions:   PRN Medications: acetaminophen, hydrALAZINE    Patient Profile   Justin [name] is  a 42 y.o. Spanish-speaking male with history of chronic systolic CHF, hx uncontrolled HTN, DM II, mitral regurgitation, ETOH abuse, hx "crack"/cocaine use. AHF team asked to see for acute on chronic combined systolic and diastolic heart failure.   Assessment/Plan  Acute on chronic combined systolic and diastolic CHF: -EF severely reduced in 2017 in setting of uncontrolled HTN. EF recovered in 2019. -Echo 02/09/22: EF 30-35%, RV okay, mild to moderate MR. -R/LHC 02/16/22: No significant CAD, RA mean 1, PA mean 19, PCWP 3, LVEDP 7, Fick CO/CI 4.17/1.99 -cMRI  02/20/22: LVEF 29%, LV severely dilated, asymmetric LVH with basal septum measuring 17 mm, RVEF 45%, patchy LGE in setpum and RV insertion sites + subepicardial LGE in inferolateral wall. Scar pattern felt to be consistent with HCM. However, d/t subepicardial LGE in inferolateral wall and presence of mediastinal lymphadenopathy, cardiac PET recommended to r/o sarcoid, small pericardial effusion.  - Echo 9/23: EF 15-20%, GIIIDD, RV mildly dilated and mod reduced function, Mod MR, trivial TR -NYHA IV on admission. Volume overloaded.  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  - Volume remains slightly elevated. Currently on 40 IV lasix BID. Will send home on 40 PO lasix daily with instructions to double if 3lb overnight weight gain or >5lb in a week. Net -21L.  - Doubt he will be compliant with TID hydralazine and daily losartan for BP control. Will switch to Entresto 49-51mg  BID (PharmD working on patient assistance) - Continue metoprolol 25 mg daily  - Continue Jardiance 10mg  daily  - Restart Spiro at 12.5 mg daily - Place  - Strict I&O, daily weights   2. Afib RVR  - Asymptomatic, rates into 170s while in ED - back in NSR - Now on Xarelto, will discuss with PharmD on how he will get medication once d/c's   3. Uncontrolled HTN: - Up to 180's on admission - GDMT as above   4. Polysubstance abuse: -ETOH and crack/cocaine -Cessation discussed -Consult HF CSW to assist with resources, previously consulted during last admission and failed to reply multiple times.    5. DM II: - A1c 7.8 - Not taking any meds PTA - per primary   6. CKD II: - Scr averaging 1.3-1.5 - 1.7>1.42 today   7. Hyperlipidemia - LDL 173 9/23, continue statin  Stable for home today. Getting f/u in AHF clinic. Meds to be filled here prior to d/c. Still working on some for patient  assistance.   AHF meds at discharge:  Jardiance 10 mg daily Lasix 40 mg daily Metoprolol succinate 25 mg daily Xarelto 20 mg daily Rosuvastatin 10 mg daily Entresto 49-51 mg BID  Spironolactone 12.5 mg daily   Length of Stay: 5  10/23, NP  09/26/2022, 8:28 AM  Advanced Heart Failure Team Pager [phone] (M-F; 7a - 5p)  Please contact [facility] for night-coverage after hours (5p -7a ) and weekends on [email or web address]   Patient seen and examined with the above-signed Advanced Practice Provider and/or Housestaff. I personally reviewed laboratory data, imaging studies and relevant notes. I independently examined the patient and formulated the important aspects of the plan. I have edited the note to reflect any of my changes or salient points. I have personally discussed the plan with the patient and/or family.  Volume status much improved. Denies CP or SOB. Renal function improved. Wants to go home  General:  Well appearing. No resp difficulty HEENT: normal Neck: supple. no JVD. Carotids 2+ bilat; no bruits. No lymphadenopathy or thryomegaly appreciated. Cor: PMI nondisplaced. Regular rate & rhythm. No rubs, gallops or murmurs. Lungs: clear Abdomen: soft, nontender, nondistended. No hepatosplenomegaly. No bruits or masses. Good bowel sounds. Extremities: no cyanosis, clubbing, rash, edema Neuro: alert & orientedx3, cranial nerves grossly intact. moves all 4 extremities w/o difficulty. Affect pleasant  Ok for d/c today. Extensive discussions with PharmD to help him get outpatient meds. Support applications filled out. Will arrange outpatient f/u in HF Clinic.   Glori Bickers, MD  11:59 AM

## 2022-09-26 NOTE — TOC Transition Note (Signed)
Transition of Care [facility]) - CM/SW Discharge Note   Patient Details  Name: Justin [name] MRN: [id number] Date of Birth: 1981-01-16  Transition of Care [facility]) CM/SW Contact:  Erenest Rasher, RN Phone Number: [phone] 09/26/2022, 4:18 PM   Clinical Narrative:     HF TOC CM spoke to pt with Spanish Interpreter, Raquel. Pt reports his sister will provide transportation home. Pt's meds up from [facility] covered by HF fund. CM discussed his appts at [facility] and [facility]. States he will be able to secure a ride. Message to HF CSW to follow on transportation to his appt to HF appt. Provided pt with scale. Educated pt on daily weights and adherence to his medication.   Final next level of care: Home/Self Care Barriers to Discharge: No Barriers Identified   Patient Goals and CMS Choice      Discharge Placement                         Discharge Plan and Services Additional resources added to the After Visit Summary for     Discharge Planning Services: CM Consult                                 Social Determinants of Health (SDOH) Interventions SDOH Screenings   Food Insecurity: No Food Insecurity (09/22/2022)  Housing: Low Risk  (09/22/2022)  Transportation Needs: Unmet Transportation Needs (09/22/2022)  Utilities: Not At Risk (09/22/2022)  Alcohol Screen: Medium Risk (02/20/2022)  Financial Resource Strain: Medium Risk (06/05/2022)  Tobacco Use: Low Risk  (09/22/2022)     Readmission Risk Interventions     No data to display

## 2022-09-26 NOTE — Discharge Summary (Signed)
Physician Discharge Summary   Patient: Justin [name] MRN: [id number] DOB: 1981-05-12  Admit date:     09/21/2022  Discharge date: 09/26/22  Discharge Physician: Justin [name]   PCP: Patient, No Pcp Per   Recommendations at discharge:    Patient has been placed on guideline medical therapy for heart failure and has been advised to be compliant his medications, to avoid decompensation, hospitalization and possible death.  Started on Rivaroxaban for paroxysmal atrial fibrillation. Follow up with cardiology and primary care as outpatient.   Discharge Diagnoses: Principal Problem:   Acute on chronic systolic CHF (congestive heart failure) (HCC) Active Problems:   Paroxysmal atrial fibrillation with RVR (HCC)   Essential hypertension   Type 2 diabetes mellitus with hyperlipidemia (HCC)   Chronic kidney disease, stage II (mild)   Alcohol abuse   Class 3 obesity ([place])  Resolved Problems:   * No resolved hospital problems. [facility] Course: Justin [name] was admitted to the hospital with the working diagnosis of decompensated heart failure.   42 yo male with the past medical history of heart failure, hypertension, T2DM, CKD and obesity who presented with dyspnea and edema. Patient run out medications for the last 7 days, he developed worsening dyspnea, orthopnea and lower extremity edema. He continue to drink alcohol. On his initial physical examination his blood pressure is 184/136. 02 saturation 80 to 91%, RR 17 and HR 87, lungs with no wheezing or rales, heart with S1 and S2 present and rhythmic, abdomen with no distention and positive lower extremity edema.   Na 136, K 4,0 CL 99, bicarbonate 28, glucose 170 bun 23 cr 1,51 BNP 833 High sensitive troponin 73 and 77  Wbc 9,3 hgb 16.3 plt 327   Chest radiograph with cardiomegaly, bilateral hilar vascular congestion, bilateral interstitial infiltrates.   EKG 111 bpm, right axis, qtc 505, sinus rhythm with pac and  pvc, poor R R wave progression, with no significant ST segment or T wave changes.   Patient was placed on IV furosemide for diuresis.   Developed atrial fibrillation with RVR.   01/12 converted to sinus rhythm.  01/13 Patient is diuresing well.  01/14 continue diuresing, will need outpatient follow up and assistance with his medications.  01/15 diuresing well.   Assessment and Plan: * Acute on chronic systolic CHF (congestive heart failure) (HCC) Echocardiogram with reduced LV systolic function EF 15 to 20%, global hypokinesis, RV mild dilatation. RV with mild reduction in systolic function. Moderate to severe MR, LA with severe enlargement.   Urine output is 5,361 ml Systolic blood pressure 443-154 and diastolic 008 to 676 mg.   Diuresis with IV furosemide bid.  Medical therapy with losartan, metoprolol succinate, and empagliflozin. On hydralazine for after load reduction.   Acute hypoxemic respiratory failure due to acute cardiogenic pulmonary edema. Continue diuresis and supplemental 02 per [place] to keep 02 saturation 92% or greater.  02 saturation is 91% on room air.   Acute on chronic congestive heart failure (HCC) -Acute on chronic combined systolic and diastolic heart failure secondary to medication non-adherent leading to uncontrolled hypertension. 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  Grade 3 diastolic dysfunction.  Severe concentric hypertrophy of the left ventricle -start IV Lasix 40mg  daily for now but likely will need uptitration depending on renal function and output.  Monitor intake and output.  Last admission he had greater than  8 L of negative balance. -Resume losartan-patient not able to afford Entresto due to financial constraints -Continue home spironolactone  Paroxysmal atrial fibrillation with RVR (HCC) Patient has converted to sinus rhythm Continue  telemetry monitoring.   Continue rate control with metoprolol  25 mg succinate.  Anticoagulation with rivaroxaban.   Acute hypoxemic respiratory failure (HCC) -secondary to cardiogenic pulmonary edema requiring 2L via [place] -continue to wean as tolerated with IV diuresis -Goal oxygen saturation of greater than 92%  Essential hypertension Continue blood pressure control with losartan Continue aggressive diuresis.  Possible transition to entresto before his discharge.  Increased dose of hydralazine for after load reduction.   Type 2 diabetes mellitus with hyperlipidemia (HCC) Glucose has been stable and patient is tolerating po well.   Chronic kidney disease, stage II (mild) CKD stage 3a.   Renal function stable from yesterday with serum cr at 1,7 with k at 4,0 and serum bicarbonate at 37. Na 140, Mg 2.3   Plan to continue diuresis with furosemide and follow up renal function in am.   Alcohol abuse No signs of alcohol withdrawal syndrome.  Continue neuro checks per unit protocol.   Class 3 obesity (HCC) BMI of 40         Consultants: cardiology  Procedures performed: none   Disposition: Home Diet recommendation:  Cardiac diet DISCHARGE MEDICATION: Allergies as of 09/26/2022   No Known Allergies      Medication List     STOP taking these medications    Accu-Chek Guide test strip Generic drug: glucose blood   Accu-Chek Guide w/Device Kit   Accu-Chek Softclix Lancets lancets   blood glucose meter kit and supplies Kit   losartan 50 MG tablet Commonly known as: COZAAR       TAKE these medications    empagliflozin 10 MG Tabs tablet Commonly known as: JARDIANCE Tome 1 tableta (10 mg en total) por va oral diariamente. (Take 1 tablet (10 mg total) by mouth daily.)   furosemide 40 MG tablet Commonly known as: LASIX Take 1 tablet (40 mg total) by mouth daily. Start taking on: September 27, 2022   metoprolol succinate 25 MG 24 hr tablet Commonly known  as: TOPROL-XL Tome 1 tableta (25 mg en total) por va oral diariamente. (Take 1 tablet (25 mg total) by mouth daily.)   rivaroxaban 20 MG Tabs tablet Commonly known as: XARELTO Take 1 tablet (20 mg total) by mouth daily with supper.   rosuvastatin 10 MG tablet Commonly known as: CRESTOR Tome 1 tableta (10 mg en total) por va oral diariamente. (Take 1 tablet (10 mg total) by mouth daily.)   sacubitril-valsartan 49-51 MG Commonly known as: ENTRESTO Take 1 tablet by mouth 2 (two) times daily.   spironolactone 25 MG tablet Commonly known as: ALDACTONE Take 0.5 tablets (12.5 mg total) by mouth daily. Start taking on: September 27, 2022 What changed: how much to take        Follow-up Information     [facility] Follow up on 10/12/2022.   Specialty: Family Medicine Why: 11:10 for hospital follow up Contact information: [place] [address] [facility] [address] [phone]        [facility] Follow up on 10/10/2022.   Specialty: Cardiology Why: [facility]  10/10/2022 a las 9:30 Traiga todas sus medicinas con usted. [facility] Contact information: [address] [id number] mc [place] [address] [phone]               Discharge Exam: Filed Weights   09/24/22 0033 09/25/22 0534 09/26/22 0514  Weight: 123.4 kg 118.1 kg 114.2 kg   BP (!) 140/98 (BP Location: Left Arm)   Pulse 97   Temp 97.9 F (36.6 C) (Oral)   Resp 19   Ht 5\' 5"  (1.651 m)   Wt 114.2 kg   SpO2 92%   BMI 41.90 kg/m   Patient is feeling better, dyspnea and edema have resolved  Neurology awake and alert ENT with mild pallor Cardiovascular with S1 and S2 present and rhythmic with no gallops, rubs or murmurs Respiratory with no wheezing or rales Abdomen with no distention No lower extremity edema (unna boots in  place)  Condition at discharge: stable  The results of significant diagnostics from this hospitalization (including imaging, microbiology, ancillary and laboratory) are listed below for reference.   Imaging Studies: DG Chest 2 View  Result Date: 09/21/2022 CLINICAL DATA:  Shortness of breath EXAM: CHEST - 2 VIEW COMPARISON:  June 02, 2022 FINDINGS: The cardiomediastinal silhouette is unchanged and enlarged in contour. No pleural effusion. No pneumothorax. Peribronchial cuffing with diffuse interstitial prominence and bibasilar heterogeneous opacities. Visualized abdomen is unremarkable. Minimal degenerative changes of the thoracic spine. IMPRESSION: Constellation of findings are favored to reflect pulmonary edema with bibasilar atelectasis. Electronically Signed   By: June 04, 2022 M.D.   On: 09/21/2022 11:03    Microbiology: Results for orders placed or performed during the hospital encounter of 02/09/22  Blood Culture (routine x 2)     Status: None   Collection Time: 02/09/22  4:15 AM   Specimen: BLOOD RIGHT ARM  Result Value Ref Range Status   Specimen Description BLOOD RIGHT ARM  Final   Special Requests   Final    BOTTLES DRAWN AEROBIC AND ANAEROBIC Blood Culture adequate volume   Culture   Final    NO GROWTH 5 DAYS Performed at [facility], [address] [place], [place]    Report Status 02/14/2022 FINAL  Final  Blood Culture (routine x 2)     Status: None   Collection Time: 02/09/22  4:16 AM   Specimen: BLOOD LEFT ARM  Result Value Ref Range Status   Specimen Description BLOOD LEFT ARM  Final   Special Requests   Final    AEROBIC BOTTLE ONLY Blood Culture results may not be optimal due to an excessive volume of blood received in culture bottles   Culture   Final    NO GROWTH 5 DAYS Performed at [facility], [address] [place], [place]    Report Status 02/14/2022 FINAL  Final  Respiratory (~20 pathogens) panel by PCR      Status: Abnormal   Collection Time: 02/09/22  6:00 AM   Specimen: Nasopharyngeal Swab; Respiratory  Result Value Ref Range Status   Adenovirus NOT DETECTED NOT DETECTED Final   Coronavirus 229E NOT DETECTED NOT DETECTED Final    Comment: (NOTE) The Coronavirus on the Respiratory Panel, DOES NOT test for the novel  Coronavirus (2019 nCoV)    Coronavirus HKU1 NOT DETECTED NOT DETECTED Final   Coronavirus NL63 NOT DETECTED NOT DETECTED Final   Coronavirus OC43 DETECTED (A) NOT DETECTED Final   Metapneumovirus NOT DETECTED NOT DETECTED Final   Rhinovirus / Enterovirus NOT DETECTED NOT DETECTED Final   Influenza A NOT DETECTED NOT DETECTED  Final   Influenza B NOT DETECTED NOT DETECTED Final   Parainfluenza Virus 1 NOT DETECTED NOT DETECTED Final   Parainfluenza Virus 2 NOT DETECTED NOT DETECTED Final   Parainfluenza Virus 3 NOT DETECTED NOT DETECTED Final   Parainfluenza Virus 4 NOT DETECTED NOT DETECTED Final   Respiratory Syncytial Virus NOT DETECTED NOT DETECTED Final   Bordetella pertussis NOT DETECTED NOT DETECTED Final   Bordetella Parapertussis NOT DETECTED NOT DETECTED Final   Chlamydophila pneumoniae NOT DETECTED NOT DETECTED Final   Mycoplasma pneumoniae NOT DETECTED NOT DETECTED Final    Comment: Performed at [facility], [place] [address], [place], [place] [address]  Resp Panel by RT-PCR (Flu A&B, Covid) Anterior Nasal Swab     Status: None   Collection Time: 02/09/22  6:00 AM   Specimen: Anterior Nasal Swab  Result Value Ref Range Status   SARS Coronavirus 2 by RT PCR NEGATIVE NEGATIVE Final    Comment: (NOTE) SARS-CoV-2 target nucleic acids are NOT DETECTED.  The SARS-CoV-2 RNA is generally detectable in upper respiratory specimens during the acute phase of infection. The lowest concentration of SARS-CoV-2 viral copies this assay can detect is 138 copies/mL. A negative result does not preclude SARS-Cov-2 infection and should not be used as the sole basis for  treatment or other patient management decisions. A negative result may occur with  improper specimen collection/handling, submission of specimen other than nasopharyngeal swab, presence of viral mutation(s) within the areas targeted by this assay, and inadequate number of viral copies(<138 copies/mL). A negative result must be combined with clinical observations, patient history, and epidemiological information. The expected result is Negative.  Fact Sheet for Patients:  EntrepreneurPulse.com.au  Fact Sheet for Healthcare Providers:  IncredibleEmployment.be  This test is no t yet approved or cleared by the [place] FDA and  has been authorized for detection and/or diagnosis of SARS-CoV-2 by FDA under an Emergency Use Authorization (EUA). This EUA will remain  in effect (meaning this test can be used) for the duration of the COVID-19 declaration under Section 564(b)(1) of the Act, 21 U.S.C.section 360bbb-3(b)(1), unless the authorization is terminated  or revoked sooner.       Influenza A by PCR NEGATIVE NEGATIVE Final   Influenza B by PCR NEGATIVE NEGATIVE Final    Comment: (NOTE) The Xpert Xpress SARS-CoV-2/FLU/RSV plus assay is intended as an aid in the diagnosis of influenza from Nasopharyngeal swab specimens and should not be used as a sole basis for treatment. Nasal washings and aspirates are unacceptable for Xpert Xpress SARS-CoV-2/FLU/RSV testing.  Fact Sheet for Patients: EntrepreneurPulse.com.au  Fact Sheet for Healthcare Providers: IncredibleEmployment.be  This test is not yet approved or cleared by the [place] FDA and has been authorized for detection and/or diagnosis of SARS-CoV-2 by FDA under an Emergency Use Authorization (EUA). This EUA will remain in effect (meaning this test can be used) for the duration of the COVID-19 declaration under Section 564(b)(1) of the Act, 21  U.S.C. section 360bbb-3(b)(1), unless the authorization is terminated or revoked.  Performed at [facility], [place] [address] [place], [place] [address]   MRSA Next Gen by PCR, Nasal     Status: None   Collection Time: 02/09/22  8:30 AM   Specimen: Nasal Mucosa; Nasal Swab  Result Value Ref Range Status   MRSA by PCR Next Gen NOT DETECTED NOT DETECTED Final    Comment: (NOTE) The GeneXpert MRSA Assay (FDA approved for NASAL specimens only), is one component  of a comprehensive MRSA colonization surveillance program. It is not intended to diagnose MRSA infection nor to guide or monitor treatment for MRSA infections. Test performance is not FDA approved in patients less than 31 years old. Performed at [facility], [address] [place], [place] [address]     Labs: CBC: Recent Labs  Lab 09/21/22 1047 09/22/22 0530  WBC 9.3 8.6  NEUTROABS 6.9  --   HGB 16.3 15.4  HCT 53.0* 49.4  MCV 90.1 90.1  PLT 327 337   Basic Metabolic Panel: Recent Labs  Lab 09/22/22 0530 09/23/22 0033 09/24/22 0053 09/25/22 0040 09/26/22 0048  NA 136 138 140 140 137  K 3.7 3.9 3.8 4.0 4.0  CL 100 100 96* 95* 95*  CO2 28 28 36* 37* 32  GLUCOSE 123* 123* 129* 130* 135*  BUN 20 23* 23* 22* 22*  CREATININE 1.52* 1.63* 1.70* 1.70* 1.42*  CALCIUM 8.7* 8.6* 8.9 9.1 9.3  MG  --  2.0 2.1 2.3 2.2   Liver Function Tests: No results for input(s): "AST", "ALT", "ALKPHOS", "BILITOT", "PROT", "ALBUMIN" in the last 168 hours. CBG: Recent Labs  Lab 09/22/22 1105 09/22/22 1558 09/22/22 2111 09/23/22 0614 09/23/22 1113  GLUCAP 144* 150* 161* 116* 195*    Discharge time spent: greater than 30 minutes.  Signed: Coralie Keens, MD [facility] 09/26/2022

## 2022-09-28 ENCOUNTER — Other Ambulatory Visit (HOSPITAL_COMMUNITY): Payer: Self-pay

## 2022-09-28 ENCOUNTER — Telehealth (HOSPITAL_COMMUNITY): Payer: Self-pay | Admitting: Pharmacy Technician

## 2022-09-28 NOTE — Telephone Encounter (Signed)
Advanced Heart Failure Patient Advocate Encounter   Patient was approved to receive Xarelto from [employer]  Effective dates: 09/27/22 through 09/28/23  BIN [id number] PCN ID [id number] Group [id number]  Added billing information to [facility] Patient is going to get medications from our outpatient pharmacy.  Charlann Boxer, CPhT

## 2022-10-06 ENCOUNTER — Telehealth (HOSPITAL_COMMUNITY): Payer: Self-pay | Admitting: Licensed Clinical Social Worker

## 2022-10-06 ENCOUNTER — Telehealth (HOSPITAL_COMMUNITY): Payer: Self-pay | Admitting: Pharmacy Technician

## 2022-10-06 NOTE — Telephone Encounter (Signed)
Advanced Heart Failure Patient Advocate Encounter  Received notification from [employer] that provider's portion of application was missing signature and date. This request was started inpatient and I do not see it scanned into the chart. Started new provider portion and sent via fax.  Will follow up.

## 2022-10-06 NOTE — Telephone Encounter (Signed)
CSW reached out to patient to assess if he needed assistance getting to his appointment next week.  Was able to successfully text with patient and inquire if he needed help with a ride to his appointment.  Patient confirmed he is aware of appointment and does not need assistance with transportation.  Will continue to follow and assist as needed  Jorge Ny, [facility] Desk#: [phone] Cell#: [phone]

## 2022-10-10 ENCOUNTER — Ambulatory Visit (HOSPITAL_COMMUNITY)
Admit: 2022-10-10 | Discharge: 2022-10-10 | Disposition: A | Discharge status: Home / Self Care | Payer: Self-pay | Attending: Family Medicine | Admitting: Family Medicine

## 2022-10-10 ENCOUNTER — Encounter (HOSPITAL_COMMUNITY): Payer: Self-pay

## 2022-10-10 VITALS — BP 144/82 | HR 112 | Wt 261.0 lb

## 2022-10-10 DIAGNOSIS — F1011 Alcohol abuse, in remission: Secondary | ICD-10-CM | POA: Insufficient documentation

## 2022-10-10 DIAGNOSIS — I340 Nonrheumatic mitral (valve) insufficiency: Secondary | ICD-10-CM | POA: Insufficient documentation

## 2022-10-10 DIAGNOSIS — Z79899 Other long term (current) drug therapy: Secondary | ICD-10-CM | POA: Insufficient documentation

## 2022-10-10 DIAGNOSIS — I480 Paroxysmal atrial fibrillation: Secondary | ICD-10-CM

## 2022-10-10 DIAGNOSIS — I4891 Unspecified atrial fibrillation: Secondary | ICD-10-CM | POA: Insufficient documentation

## 2022-10-10 DIAGNOSIS — Z7984 Long term (current) use of oral hypoglycemic drugs: Secondary | ICD-10-CM | POA: Insufficient documentation

## 2022-10-10 DIAGNOSIS — Z5986 Financial insecurity: Secondary | ICD-10-CM | POA: Insufficient documentation

## 2022-10-10 DIAGNOSIS — I5022 Chronic systolic (congestive) heart failure: Secondary | ICD-10-CM

## 2022-10-10 DIAGNOSIS — E1122 Type 2 diabetes mellitus with diabetic chronic kidney disease: Secondary | ICD-10-CM | POA: Insufficient documentation

## 2022-10-10 DIAGNOSIS — I5042 Chronic combined systolic (congestive) and diastolic (congestive) heart failure: Secondary | ICD-10-CM | POA: Insufficient documentation

## 2022-10-10 DIAGNOSIS — F1910 Other psychoactive substance abuse, uncomplicated: Secondary | ICD-10-CM | POA: Insufficient documentation

## 2022-10-10 DIAGNOSIS — R000 Tachycardia, unspecified: Secondary | ICD-10-CM | POA: Insufficient documentation

## 2022-10-10 DIAGNOSIS — Z139 Encounter for screening, unspecified: Secondary | ICD-10-CM

## 2022-10-10 DIAGNOSIS — I130 Hypertensive heart and chronic kidney disease with heart failure and stage 1 through stage 4 chronic kidney disease, or unspecified chronic kidney disease: Secondary | ICD-10-CM | POA: Insufficient documentation

## 2022-10-10 DIAGNOSIS — Z7901 Long term (current) use of anticoagulants: Secondary | ICD-10-CM | POA: Insufficient documentation

## 2022-10-10 DIAGNOSIS — I10 Essential (primary) hypertension: Secondary | ICD-10-CM

## 2022-10-10 DIAGNOSIS — N182 Chronic kidney disease, stage 2 (mild): Secondary | ICD-10-CM | POA: Insufficient documentation

## 2022-10-10 DIAGNOSIS — E785 Hyperlipidemia, unspecified: Secondary | ICD-10-CM | POA: Insufficient documentation

## 2022-10-10 LAB — BASIC METABOLIC PANEL
Anion gap: 11 (ref 5–15)
BUN: 21 mg/dL — ABNORMAL HIGH (ref 6–20)
CO2: 24 mmol/L (ref 22–32)
Calcium: 9.1 mg/dL (ref 8.9–10.3)
Chloride: 101 mmol/L (ref 98–111)
Creatinine, Ser: 1.51 mg/dL — ABNORMAL HIGH (ref 0.61–1.24)
GFR, Estimated: 59 mL/min — ABNORMAL LOW (ref >60)
Glucose, Bld: 270 mg/dL — ABNORMAL HIGH (ref 70–99)
Potassium: 3.4 mmol/L — ABNORMAL LOW (ref 3.5–5.1)
Sodium: 136 mmol/L (ref 135–145)

## 2022-10-10 LAB — CBC
HCT: 52.2 % — ABNORMAL HIGH (ref 39.0–52.0)
Hemoglobin: 16.5 g/dL (ref 13.0–17.0)
MCH: 27.5 pg (ref 26.0–34.0)
MCHC: 31.6 g/dL (ref 30.0–36.0)
MCV: 87.1 fL (ref 80.0–100.0)
Platelets: 249 10*3/uL (ref 150–400)
RBC: 5.99 MIL/uL — ABNORMAL HIGH (ref 4.22–5.81)
RDW: 13.2 % (ref 11.5–15.5)
WBC: 4 10*3/uL (ref 4.0–10.5)
nRBC: 0 % (ref 0.0–0.2)

## 2022-10-10 LAB — BRAIN NATRIURETIC PEPTIDE: B Natriuretic Peptide: 264.1 pg/mL — ABNORMAL HIGH (ref 0.0–100.0)

## 2022-10-10 MED ORDER — FUROSEMIDE 40 MG PO TABS: 60.0000 mg | ORAL_TABLET | Freq: Every day | ORAL | 0 refills | Status: DC

## 2022-10-10 MED ORDER — ENTRESTO 97-103 MG PO TABS: 1.0000 | ORAL_TABLET | Freq: Two times a day (BID) | ORAL | 8 refills | Status: DC

## 2022-10-10 NOTE — Patient Instructions (Addendum)
Gracias por venir [employer]  Los laboratorios se realizaron hoy, si algn laboratorio es anormal, la clnica lo llamar. La ausencia de malas noticias son buenas noticias  Jene Every 97/103 1 comprimido dos veces al da AUMENTAR Lasix a 60 1 1/2 tableta al da Su mdico recomienda que programe una cita de seguimiento en: 2-3 semanas en la clnica 3 meses con el Dr. Aundra Dubin con ecocardiograma  Su mdico le ha solicitado que se haga Optometrist. La ecocardiografa es una prueba indolora que utiliza ondas sonoras para crear imgenes de su corazn. Le proporciona a su mdico informacin sobre el tamao y la forma de su corazn y qu tan bien estn funcionando las cmaras y vlvulas de su corazn. Este procedimiento dura aproximadamente una hora. No existen restricciones para este procedimiento.    [place] siguientes cosas TODOS LOS DAS: 1. Psese por la maana antes del desayuno. Escrbalo y gurdelo en un registro. 2. Tome sus medicamentos segn lo prescrito 3. Consuma alimentos bajos en sal: limite la sal (sodio) a 2000 mg por da. 4. Mantente lo ms activo que [employer] 5. Limite todos los lquidos del da a menos de 2 litros.  En [place], usted y sus necesidades de salud son [place] prioridad. Como parte de nuestra misin continua de brindarle una atencin cardaca excepcional, hemos creado equipos de atencin de proveedores designados. Estos equipos de atencin incluyen a su cardilogo principal (mdico) y proveedores de Financial planner (APP: asistentes mdicos y enfermeras practicantes), quienes trabajan juntos para brindarle la atencin que necesita, cuando la necesita.  Puede consultar a cualquiera de los siguientes proveedores en su equipo de atencin designado en su prximo seguimiento:  Dr. Quillian Quince Bensimhon  Dr. Loralie Champagne  Dr. Hebert Soho  Amy Ninfa Meeker, NP  Brittainy Rosita Fire, [place]  Allena Katz, NP   Marlyce Huge, [place]  Alma Daz, NP  Audry Riles, doctora en farmacia   Asegrese de traer todos los frascos de sus medicamentos a cada cita.   Clayburn Pert por [facility]    Si tiene alguna pregunta o inquietud antes de su prxima cita, envenos un mensaje a travs de [place] o llame a nuestra oficina al [phone].  PARA DEJAR UN MENSAJE A LA ENFERMERA, SELECCIONE LA OPCIN 2, DEJE UN MENSAJE QUE INCLUYA:  SU NOMBRE  FECHA DE NACIMIENTO  NMERO DE DEVOLUCIN DE LLAMADA  MOTIVO DE LA LLAMADA**esto es importante ya que priorizamos las devoluciones de llamadas  RECIBIRS UNA LLAMADA EL MISMO DA SIEMPRE QUE LLAME ANTES DE LAS 4:00 PM

## 2022-10-10 NOTE — Progress Notes (Signed)
ADVANCED HF CLINIC CONSULT NOTE  PCP: Juluis Mire, NP Primary Cardiologist: Buford Dresser, MD HF Cardiologist: Dr. Aundra Dubin  HPI: Justin [name] is a 42 y.o. Spanish-speaking male with history of chronic systolic CHF, hx uncontrolled HTN, DM II, mitral regurgitation, ETOH abuse, hx "crack"/cocaine use.   Heart failure dates back to 2017. Admitted to [facility] with acute systolic CHF and hypertensive urgency. Apparently echo showed severe LV dsyfunction and severe LVH. Cath with no CAD.   Admitted in 06/23 with acute respiratory failure with hypoxia requiring BiPAP 2/2 a/c CHF. He was diuresed and initiated on GDMT.    After discharge he no-showed TOC and cardiology clinic follow-ups.    Admitted 1/24 with a/c CHF, out of meds x 1 month. In AF with RVR in ED, converted to NSR. Started on Xarelto, diuresed with IV lasix and GDMT added back. Discharged home, weight 251 lbs.   Today he returns for post hospital HF follow up, here with interpretor. Overall feeling fine. He is not short of breath walking on flat ground or up steps. Able to push a cart grocery shopping without dyspnea. He is having a HA and non productive cough. He has + orthopnea. Denies palpitations, CP, dizziness, edema, or PND. Appetite ok. No fever or chills. He is not weighing at home but has a scale. Taking all medications. Lives with his sister. Works as a Curator. No family history of heart disease. No tobacco/ETOH recently, last used cocaine about 1 month ago.  ECG (personally reviewed): ST  109 bpm  Labs (1/24): K 4.0, creatinine 1.42   Cardiac studies - Echo (9/23): EF 15-20%, GIIIDD, RV mildly dilated and mod reduced function, Mod MR, trivial TR  - Echo (6/23): EF 30-35%, RV okay, mild to moderate MR.  - R/LHC (6/23): No significant CAD, RA mean 1, PA mean 19, PCWP 3, LVEDP 7, Fick CO/CI 4.17/1.99  - cMRI (02/20/22): LVEF 29%, LV severely dilated, asymmetric LVH with basal septum  measuring 17 mm, RVEF 45%, patchy LGE in setpum and RV insertion sites + subepicardial LGE in inferolateral wall. Scar pattern felt to be consistent with HCM. However, d/t subepicardial LGE in inferolateral wall and presence of mediastinal lymphadenopathy, cardiac PET recommended to r/o sarcoid, small pericardial effusion.   Echo (8/19): EF improved to 55%, severe LVH, RV okay   Review of Systems: [y] = yes, [ ]$  = no   General: Weight gain Blue.Reese ]; Weight loss [ ]$ ; Anorexia [ ]$ ; Fatigue [ ]$ ; Fever [ ]$ ; Chills [ ]$ ; Weakness [ ]$   Cardiac: Chest pain/pressure [ ]$ ; Resting SOB [ ]$ ; Exertional SOB [ ]$ ; Orthopnea [ y]; Pedal Edema [ ]$ ; Palpitations [ ]$ ; Syncope [ ]$ ; Presyncope [ ]$ ; Paroxysmal nocturnal dyspnea[ ]$   Pulmonary: Cough [ y]; Wheezing[ ]$ ; Hemoptysis[ ]$ ; Sputum [ ]$ ; Snoring Blue.Reese ]  GI: Vomiting[ ]$ ; Dysphagia[ ]$ ; Melena[ ]$ ; Hematochezia [ ]$ ; Heartburn[ ]$ ; Abdominal pain [ ]$ ; Constipation [ ]$ ; Diarrhea [ ]$ ; BRBPR [ ]$   GU: Hematuria[ ]$ ; Dysuria [ ]$ ; Nocturia[ ]$   Vascular: Pain in legs with walking [ ]$ ; Pain in feet with lying flat [ ]$ ; Non-healing sores [ ]$ ; Stroke [ ]$ ; TIA [ ]$ ; Slurred speech [ ]$ ;  Neuro: Headaches[ ]$ ; Vertigo[ ]$ ; Seizures[ ]$ ; Paresthesias[ ]$ ;Blurred vision [ ]$ ; Diplopia [ ]$ ; Vision changes [ ]$   Ortho/Skin: Arthritis [ ]$ ; Joint pain [ ]$ ; Muscle pain [ ]$ ; Joint swelling [ ]$ ; Back Pain [ ]$ ; Rash [ ]$   Psych:  Depression[ ] ; Anxiety[ ]   Heme: Bleeding problems [ ] ; Clotting disorders [ ] ; Anemia [ ]   Endocrine: Diabetes Blue.Reese ]; Thyroid dysfunction[ ]    Past Medical History:  Diagnosis Date   Heart failure with reduced ejection fraction [facility]) 2017   [facility]   Hypertension 2017   [facility]   Current Outpatient Medications  Medication Sig Dispense Refill   empagliflozin (JARDIANCE) 10 MG TABS tablet Take 1 tablet (10 mg total) by mouth daily. 30 tablet 0   metoprolol succinate (TOPROL-XL) 50 MG 24 hr tablet Take 50 mg by mouth daily. Take with or immediately  following a meal.     rivaroxaban (XARELTO) 20 MG TABS tablet Take 1 tablet (20 mg total) by mouth daily with supper. 30 tablet 0   rosuvastatin (CRESTOR) 10 MG tablet Take 1 tablet (10 mg total) by mouth daily. 30 tablet 0   sacubitril-valsartan (ENTRESTO) 97-103 MG Take 1 tablet by mouth 2 (two) times daily. 60 tablet 8   spironolactone (ALDACTONE) 25 MG tablet Take 1/2 tablet (12.5 mg total) by mouth daily. 15 tablet 0   furosemide (LASIX) 40 MG tablet Take 1.5 tablets (60 mg total) by mouth daily. 30 tablet 0   No current facility-administered medications for this encounter.   No Known Allergies  Social History   Socioeconomic History   Marital status: Single    Spouse name: Not on file   Number of children: 3   Years of education: Not on file   Highest education level: 7th grade  Occupational History   Occupation: Curator    Comment: varies  Tobacco Use   Smoking status: Never   Smokeless tobacco: Never  Vaping Use   Vaping Use: Never used  Substance and Sexual Activity   Alcohol use: Yes    Alcohol/week: 30.0 standard drinks of alcohol    Types: 30 Cans of beer per week    Comment: 30-40 on weekends   Drug use: Not Currently    Types: "Crack" cocaine, Cocaine    Comment: 2 weeks   Sexual activity: Not on file  Other Topics Concern   Not on file  Social History Narrative   Not on file   Social Determinants of Health   Financial Resource Strain: Medium Risk (06/05/2022)   Overall Financial Resource Strain (CARDIA)    Difficulty of Paying Living Expenses: Somewhat hard  Food Insecurity: No Food Insecurity (09/22/2022)   Hunger Vital Sign    Worried About Running Out of Food in the Last Year: Never true    Ran Out of Food in the Last Year: Never true  Transportation Needs: Unmet Transportation Needs (09/22/2022)   PRAPARE - Hydrologist (Medical): Yes    Lack of Transportation (Non-Medical): Yes  Physical Activity: Not on file  Stress:  Not on file  Social Connections: Not on file  Intimate Partner Violence: Not At Risk (09/22/2022)   Humiliation, Afraid, Rape, and Kick questionnaire    Fear of Current or Ex-Partner: No    Emotionally Abused: No    Physically Abused: No    Sexually Abused: No    Family History  Problem Relation Age of Onset   Hypertension Father    BP (!) 144/82   Pulse (!) 112   Wt 118.4 kg (261 lb)   SpO2 90%   BMI 43.43 kg/m   Wt Readings from Last 3 Encounters:  10/10/22 118.4 kg (261 lb)  09/26/22 114.2 kg (251  lb 12.3 oz)  06/06/22 111.2 kg (245 lb 1.6 oz)   PHYSICAL EXAM: General:  NAD. No resp difficulty, walked into clinic HEENT: Normal Neck: Supple. Thick neck, JVD difficult. Carotids 2+ bilat; no bruits. No lymphadenopathy or thryomegaly appreciated. Cor: PMI nondisplaced. Regular rate & rhythm. No rubs, gallops or murmurs. Lungs: Bibasilar crackles Abdomen: Soft, nontender, nondistended. No hepatosplenomegaly. No bruits or masses. Good bowel sounds. Extremities: No cyanosis, clubbing, rash, trace BLEedema Neuro: Alert & oriented x 3, cranial nerves grossly intact. Moves all 4 extremities w/o difficulty. Affect pleasant.  ASSESSMENT & PLAN: Chronic combined systolic and diastolic CHF: - EF severely reduced in 2017 in setting of uncontrolled HTN. EF recovered in 2019. - Echo (6/23): EF 30-35%, RV okay, mild to moderate MR. - R/LHC (6/23): No significant CAD, RA mean 1, PA mean 19, PCWP 3, LVEDP 7, Fick CO/CI 4.17/1.99 - cMRI (6/23): LVEF 29%, LV severely dilated, asymmetric LVH with basal septum measuring 17 mm, RVEF 45%, patchy LGE in setpum and RV insertion sites + subepicardial LGE in inferolateral wall. Scar pattern felt to be consistent with HCM. However, d/t subepicardial LGE in inferolateral wall and presence of mediastinal lymphadenopathy, cardiac PET recommended to r/o sarcoid, small pericardial effusion.  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 Today, NYHA I. He is volume overloaded, weight up 10 lbs. - Increase Entresto to 97/103 mg bid. BMET and BNP today. Repeat labs at follow up in 2 weeks. - Increase Lasix to 60 mg daily. - Continue metoprolol 25 mg daily.  - Continue Jardiance 10 mg daily.  - Continue spiro 12.5 mg daily. 2. Atrial fibrillation: New. RVR during recent admission. ST on ECG today.  - Continue Xarelto. No bleeding issues. CBC today. - Continue Toprol. - Needs sleep study, but uninsured. 3. HTN: Uncontrolled. BP improving, but still elevated. BP on arrival to clinic 200/110-->144/82 after resting for 10 minutes. - Increase GDMT as above. - Given Rx for BP cuff, I asked him to check daily and log. 4. Polysubstance abuse: Previous ETOH and crack/cocaine use. He is no longer drinking ETOH. Last used occaine a month ago. - Cessation imperative, we discussed this. 5. DM II: Hgb A1c 7.8 . He is not on insulin. - Continue SGLT2i. No GU symptoms. - He has new patient PCP follow up soon. 6. CKD II: baseline SCr 1.3-1.5. - Continue SGLT2i. BMET today. 7. Hyperlipidemia: LDL 173 (9/23) - Continue statin + SGLT2i. 8. SDOH: HFSW helping with resources. His friend helps him with transportation. He has a scale and cell phone. - Pharmacy helping with patient assistance for Xarelto, Entresto and Jardiance.  Follow up in 2-3 weeks with APP and 3 months with Dr. Aundra Dubin + echo.  Allena Katz, FNP-BC 10/10/22

## 2022-10-11 ENCOUNTER — Telehealth (HOSPITAL_COMMUNITY): Payer: Self-pay

## 2022-10-11 NOTE — Telephone Encounter (Addendum)
-----  Message from Rafael Bihari, [place] sent at 10/10/2022  4:54 PM EST ----- K is low. Please take 40 KCL x 1 today, then 20 daily thereafter. He has follow up labs arranged.   Spoke with patient via text messages in spanish. Pt does not answer phone calls. Confirmed DOB and pt has potassium at home. Take 16mEq of potassium today, 50meq potassium daily starting 10/12/22. Confirmed follow up appt with [facility] 2/1 with address.

## 2022-10-11 NOTE — Telephone Encounter (Signed)
-----  Message from Rafael Bihari, [place] sent at 10/10/2022  4:54 PM EST ----- K is low. Please take 40 KCL x 1 today, then 20 daily thereafter. He has follow up labs arranged.

## 2022-10-12 ENCOUNTER — Ambulatory Visit (INDEPENDENT_AMBULATORY_CARE_PROVIDER_SITE_OTHER): Payer: Self-pay | Admitting: Primary Care

## 2022-10-12 ENCOUNTER — Other Ambulatory Visit: Payer: Self-pay

## 2022-10-12 ENCOUNTER — Encounter (INDEPENDENT_AMBULATORY_CARE_PROVIDER_SITE_OTHER): Payer: Self-pay | Admitting: Primary Care

## 2022-10-12 VITALS — BP 132/90 | HR 58 | Resp 16 | Ht 66.0 in | Wt 254.4 lb

## 2022-10-12 DIAGNOSIS — E662 Morbid (severe) obesity with alveolar hypoventilation: Secondary | ICD-10-CM

## 2022-10-12 DIAGNOSIS — E119 Type 2 diabetes mellitus without complications: Secondary | ICD-10-CM

## 2022-10-12 DIAGNOSIS — F1910 Other psychoactive substance abuse, uncomplicated: Secondary | ICD-10-CM

## 2022-10-12 DIAGNOSIS — R0689 Other abnormalities of breathing: Secondary | ICD-10-CM

## 2022-10-12 DIAGNOSIS — Z7689 Persons encountering health services in other specified circumstances: Secondary | ICD-10-CM

## 2022-10-12 DIAGNOSIS — Z09 Encounter for follow-up examination after completed treatment for conditions other than malignant neoplasm: Secondary | ICD-10-CM

## 2022-10-12 DIAGNOSIS — I5032 Chronic diastolic (congestive) heart failure: Secondary | ICD-10-CM

## 2022-10-12 LAB — GLUCOSE, POCT (MANUAL RESULT ENTRY): POC Glucose: 241 mg/dl — AB (ref 70–99)

## 2022-10-12 MED ORDER — METFORMIN HCL 1000 MG PO TABS
1000.0000 mg | ORAL_TABLET | Freq: Two times a day (BID) | ORAL | 1 refills | Status: DC
  Filled 2022-10-12 – 2022-10-25 (×2): qty 180, 90d supply, fill #0

## 2022-10-12 NOTE — Patient Instructions (Signed)
Recuento de caloras para bajar de peso Calorie Counting for Weight Loss Las caloras son unidades de energa. El cuerpo necesita una cierta cantidad de caloras de los alimentos para que lo ayuden a funcionar durante todo el da. Cuando se comen o beben ms caloras de las que el cuerpo necesita, este acumula las caloras adicionales mayormente como grasa. Cuando se comen o beben menos caloras de las que el cuerpo necesita, este quema grasa para obtener la energa que necesita. El recuento de caloras es el registro de la cantidad de caloras que se comen y beben cada da. El recuento de caloras puede ser de ayuda si necesita perder peso. Si come menos caloras de las que el cuerpo necesita, debera bajar de peso. Pregntele al mdico cul es un peso sano para usted. Para que el recuento de caloras funcione, usted tendr que ingerir la cantidad de caloras adecuadas cada da, para bajar una cantidad de peso saludable por semana. Un nutricionista puede ayudar a determinar la cantidad de caloras que usted necesita por da y sugerirle formas de alcanzar su objetivo calrico. Una cantidad de peso saludable para bajar cada semana suele ser entre 1 y 2 libras (0.5 a 0.9 kg). Esto habitualmente significa que su ingesta diaria de caloras se debera reducir en unas 500 a 750 caloras. Ingerir de 1200 a 1500 caloras por da puede ayudar a la mayora de las mujeres a bajar de peso. Ingerir de 1500 a 1800 caloras por da puede ayudar a la mayora de los hombres a bajar de peso. Qu debo saber acerca del recuento de caloras? Trabaje con el mdico o el nutricionista para determinar cuntas caloras debe recibir cada da. A fin de alcanzar su objetivo diario de caloras, tendr que: Averiguar cuntas caloras hay en cada alimento que le gustara comer. Intente hacerlo antes de comer. Decidir la cantidad que puede comer del alimento. Llevar un registro de los alimentos. Para esto, anote lo que comi y cuntas  caloras tena. Para perder peso con xito, es importante equilibrar el recuento de caloras con un estilo de vida saludable que incluya actividad fsica de forma regular. Dnde encuentro informacin sobre las caloras?  Es posible encontrar la cantidad de caloras que contiene un alimento en la etiqueta de informacin nutricional. Si un alimento no tiene una etiqueta de informacin nutricional, intente buscar las caloras en Internet o pida ayuda al nutricionista. Recuerde que las caloras se calculan por porcin. Si opta por comer ms de una porcin de un alimento, tendr que multiplicar las caloras de una porcin por la cantidad de porciones que planea comer. Por ejemplo, la etiqueta de un envase de pan puede decir que el tamao de una porcin es 1 rodaja, y que una porcin tiene 90 caloras. Si come 1 rodaja, habr comido 90 caloras. Si come 2 rodajas, habr comido 180 caloras. Cmo llevo un registro de comidas? Despus de cada vez que coma, anote lo siguiente en el registro de alimentos lo antes posible: Lo que comi. Asegrese de incluir los aderezos, las salsas y otros extras en los alimentos. La cantidad que comi. Esto se puede medir en tazas, onzas o cantidad de alimentos. Cuntas caloras haba en cada alimento y en cada bebida. La cantidad total de caloras en la comida que tom. Tenga a mano el registro de alimentos, por ejemplo, en un anotador de bolsillo o utilice una aplicacin o sitio web en el telfono mvil. Algunos programas calcularn las caloras por usted y le mostrarn la cantidad de   caloras que le quedan para llegar al objetivo diario. Cules son algunos consejos para controlar las porciones? Sepa cuntas caloras hay en una porcin. Esto lo ayudar a saber cuntas porciones de un alimento determinado puede comer. Use una taza medidora para medir los tamaos de las porciones. Tambin puede intentar pesar las porciones en una balanza de cocina. Con el tiempo, podr hacer  un clculo estimativo de los tamaos de las porciones de algunos alimentos. Dedique tiempo a poner porciones de diferentes alimentos en sus platos, tazones y tazas predilectos, a fin de saber cmo se ve una porcin. Intente no comer directamente de un envase de alimentos, por ejemplo, de una bolsa o una caja. Comer directamente del envase dificulta ver cunto est comiendo y puede conducir a comer en exceso. Ponga la cantidad que le gustara comer en una taza o un plato, a fin de asegurarse de que est comiendo la porcin correcta. Use platos, vasos y tazones ms pequeos para medir porciones ms pequeas y evitar no comer en exceso. Intente no realizar varias tareas al mismo tiempo. Por ejemplo, evite mirar televisin o usar la computadora mientras come. Si es la hora de comer, sintese a la mesa y disfrute de la comida. Esto lo ayudar a reconocer cundo est satisfecho. Tambin le permitir estar ms consciente de qu come y cunto come. Consejos para seguir este plan Al leer las etiquetas de los alimentos Controle el recuento de caloras en comparacin con el tamao de la porcin. El tamao de la porcin puede ser ms pequeo de lo que suele comer. Verifique la fuente de las caloras. Intente elegir alimentos ricos en protenas, fibras y vitaminas, y bajos en grasas saturadas, grasas trans y sodio. Al ir de compras Lea las etiquetas nutricionales cuando compre. Esto lo ayudar a tomar decisiones saludables sobre qu alimentos comprar. Preste atencin a las etiquetas nutricionales de alimentos bajos en grasas o sin grasas. Estos alimentos a veces tienen la misma cantidad de caloras o ms caloras que las versiones ricas en grasas. Con frecuencia, tambin tienen agregados de azcar, almidn o sal, para darles el sabor que fue eliminado con las grasas. Haga una lista de compras con los alimentos que tienen un menor contenido de caloras y resptela. Al cocinar Intente cocinar sus alimentos preferidos  de una manera ms saludable. Por ejemplo, pruebe hornear en vez de frer. Utilice productos lcteos descremados. Planificacin de las comidas Utilice ms frutas y verduras. La mitad de su plato debe ser de frutas y verduras. Incluya protenas magras, como pollo, pavo y pescado. Estilo de vida Cada semana, trate de hacer una de las siguientes cosas: 150 minutos de ejercicio moderado, como caminar. 75 minutos de ejercicio enrgico, como correr. Informacin general Sepa cuntas caloras tienen los alimentos que come con ms frecuencia. Esto le ayudar a contar las caloras ms rpidamente. Encuentre un mtodo para controlar las caloras que funcione para usted. Sea creativo. Pruebe aplicaciones o programas distintos, si llevar un registro de las caloras no funciona para usted. Qu alimentos debo consumir?  Consuma alimentos nutritivos. Es mejor comer un alimento nutritivo, de alto contenido calrico, como un aguacate, que uno con pocos nutrientes, como una bolsa de patatas fritas. Use sus caloras en alimentos y bebidas que lo sacien y no lo dejen con apetito apenas termina de comer. Ejemplos de alimentos que lo sacian son los frutos secos y mantequillas de frutos secos, verduras, protenas magras y alimentos con alto contenido de fibra como los cereales integrales. Los alimentos con alto   contenido de fibra son aquellos que tienen ms de 5 g de fibra por porcin. Preste atencin a las caloras en las bebidas. Las bebidas de bajas caloras incluyen agua y refrescos sin azcar. Es posible que los productos que se enumeran ms arriba no constituyan una lista completa de los alimentos y las bebidas que puede tomar. Consulte a un nutricionista para obtener ms informacin. Qu alimentos debo limitar? Limite el consumo de alimentos o bebidas que no sean buenas fuentes de vitaminas, minerales o protenas, o que tengan alto contenido de grasas no saludables. Estos incluyen: Caramelos. Otros  dulces. Refrescos, bebidas con caf especiales, alcohol y jugo. Es posible que los productos que se enumeran ms arriba no constituyan una lista completa de los alimentos y las bebidas que debe evitar. Consulte a un nutricionista para obtener ms informacin. Cmo puedo hacer el recuento de caloras cuando como afuera? Preste atencin a las porciones. A menudo, las porciones son mucho ms grandes al comer afuera. Pruebe con estos consejos para mantener las porciones ms pequeas: Considere la posibilidad de compartir una comida en lugar de tomarla toda usted solo. Si pide su propia comida, coma solo la mitad. Antes de empezar a comer, pida un recipiente y ponga la mitad de la comida en l. Cuando sea posible, considere la posibilidad de pedir porciones ms pequeas del men en lugar de porciones completas. Preste atencin a la eleccin de alimentos y bebidas. Saber la forma en que se cocinan los alimentos y lo que incluye la comida puede ayudarlo a ingerir menos caloras. Si se detallan las caloras en el men, elija las opciones que contengan la menor cantidad. Elija platos que incluyan verduras, frutas, cereales integrales, productos lcteos con bajo contenido de grasa y protenas magras. Opte por los alimentos hervidos, asados, cocidos a la parrilla o al vapor. Evite los alimentos a los que se les ponga mantequilla, que estn empanados o fritos, o que se sirvan con salsa a base de crema. Generalmente, los alimentos que se etiquetan como "crujientes" estn fritos, a menos que se indique lo contrario. Elija el agua, la leche descremada, el t helado sin azcar u otras bebidas que no contengan azcares agregados. Si desea una bebida alcohlica, escoja una opcin con menos caloras, como una copa de vino o una cerveza ligera. Ordene los aderezos, las salsas y los jarabes aparte. Estos son, con frecuencia, de alto contenido en caloras, por lo que debe limitar la cantidad que ingiere. Si desea una  ensalada, elija una de hortalizas y pida carnes a la parrilla. Evite las guarniciones adicionales como el tocino, el queso o los alimentos fritos. Ordene el aderezo aparte o pida aceite de oliva y vinagre o limn para aderezar. Haga un clculo estimativo de la cantidad de porciones que le sirven. Conocer el tamao de las porciones lo ayudar a estar atento a la cantidad de comida que come en los restaurantes. Dnde buscar ms informacin Centers for Disease Control and Prevention (Centros para el Control y la Prevencin de Enfermedades): www.cdc.gov U.S. Department of Agriculture (Departamento de Agricultura de los EE. [place].): myplate.gov Resumen El recuento de caloras es el registro de la cantidad de caloras que se comen y beben cada da. Si come menos caloras de las que el cuerpo necesita, debera bajar de peso. Una cantidad de peso saludable para bajar por semana suele ser entre 1 y 2 libras (0.5 a 0.9 kg). Esto significa, con frecuencia, reducir su ingesta diaria de caloras unas 500 a 750 caloras. Es posible   encontrar la cantidad de caloras que contiene un alimento en la etiqueta de informacin nutricional. Si un alimento no tiene una etiqueta de informacin nutricional, intente buscar las caloras en Internet o pida ayuda al nutricionista. Use platos, vasos y tazones ms pequeos para medir porciones ms pequeas y evitar no comer en exceso. Use sus caloras en alimentos y bebidas que lo sacien y no lo dejen con apetito poco tiempo despus de haber comido. Esta informacin no tiene como fin reemplazar el consejo del mdico. Asegrese de hacerle al mdico cualquier pregunta que tenga. Document Revised: 12/22/2019 Document Reviewed: 12/22/2019 Elsevier Patient Education  2023 [employer].  

## 2022-10-12 NOTE — Progress Notes (Signed)
[facility]   Subjective:   [name] is a 42 y.o. 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 Patient presented to the ED with Shortness of Breath and Leg Swelling . Admit date to the hospital was 09/21/22, patient was discharged from the hospital on 09/26/22, patient was admitted for:  Acute on chronic systolic CHF (congestive heart failure), Paroxysmal atrial fibrillation with RVR ([name]), Essential hypertension,  Type 2 diabetes mellitus with hyperlipidemia (HCC) ,Chronic kidney disease, stage II (mild), Alcohol abuse and Class 3 obesity ([name]). Today he presents with a cough , shortness of breath and adventitious breath sounds.    Past Medical History:  Diagnosis Date   Heart failure with reduced ejection fraction [facility]) 2017   [facility]   Hypertension 2017   [facility]     No Known Allergies    Current Outpatient Medications on File Prior to Visit  Medication Sig Dispense Refill   empagliflozin (JARDIANCE) 10 MG TABS tablet Take 1 tablet (10 mg total) by mouth daily. 30 tablet 0   furosemide (LASIX) 40 MG tablet Take 1.5 tablets (60 mg total) by mouth daily. 30 tablet 0   metoprolol succinate (TOPROL-XL) 50 MG 24 hr tablet Take 50 mg by mouth daily. Take with or immediately following a meal.     rivaroxaban (XARELTO) 20 MG TABS tablet Take 1 tablet (20 mg total) by mouth daily with supper. 30 tablet 0   rosuvastatin (CRESTOR) 10 MG tablet Take 1 tablet (10 mg total) by mouth daily. 30 tablet 0   sacubitril-valsartan (ENTRESTO) 97-103 MG Take 1 tablet by mouth 2 (two) times daily. 60 tablet 8   spironolactone (ALDACTONE) 25 MG tablet Take 1/2 tablet (12.5 mg total) by mouth daily. 15 tablet 0   No current facility-administered medications on file prior to visit.     Review of System: Comprehensive ROS Pertinent positive and negative noted in HPI    Objective:  Blood  Pressure (Abnormal) 138/91   Pulse (Abnormal) 58   Respiration 16   Height 5\' 6"  (1.676 m)   Weight 254 lb 6.4 oz (115.4 kg)   Oxygen Saturation 95%   Body Mass Index 41.06 kg/m   Filed Weights   10/12/22 1137  Weight: 254 lb 6.4 oz (115.4 kg)    Physical Exam: General Appearance: Well nourished, morbid obese in no apparent distress. Eyes: PERRLA, EOMs, conjunctiva no swelling or erythema Sinuses: No Frontal/maxillary tenderness ENT/Mouth: Ext aud canals clear, TMs without erythema, bulging. No erythema, swelling, or exudate on post pharynx.  Tonsils not swollen or erythematous. Hearing normal.  Neck: Supple, thyroid normal.  Respiratory: Respiratory effort normal, BS bilaterally with adventitious  Cardio: RRR with no MRGs. Brisk peripheral pulses without edema.  Abdomen: Soft, + BS.  Non tender, no guarding, rebound, hernias, masses. Lymphatics: Non tender without lymphadenopathy.  Musculoskeletal: Full ROM, 5/5 strength, normal gait.  Skin: Warm, dry without rashes, lesions, ecchymosis.  Neuro: Cranial nerves intact. Normal muscle tone, no cerebellar symptoms. Sensation intact.  Psych: Awake and oriented X 3, normal affect, Insight and Judgment appropriate.    Assessment:  [name] was seen today for hospitalization follow-up.  Diagnoses and all orders for this visit:  Encounter to establish care  Polysubstance abuse [facility]) Discussed options to help stop and treatment also followed by cardiology   Hospital discharge follow-up Completed by Primary Cardiologist: Buford Dresser, MD ,HF Cardiologist: Dr. Aundra Dubin   Type 2 diabetes  mellitus without complication, without long-term current use of insulin ([place]) - educated on lifestyle modifications, including but not limited to diet choices and adding exercise to daily routine.   -     metFORMIN (GLUCOPHAGE) 1000 MG tablet; Take 1 tablet (1,000 mg total) by mouth 2 (two) times daily with a meal.  POCT glucose (manual  entry)  Chronic diastolic heart failure (HCC) 2/2  Obesity hypoventilation syndrome ([place]) Followed by cardiology   Morbid obesity ([place]) Discussed diet and exercise for person with BMI >25. Instructed: You must burn more calories than you eat. Losing 5 percent of your body weight should be considered a success. In the longer term, losing more than 15 percent of your body weight and staying at this weight is an extremely good result. However, keep in mind that even losing 5 percent of your body weight leads to important health benefits, so try not to get discouraged if you're not able to lose more than this. Will recheck weight in 3-6 months.   Adventitious breath sounds -     DG Chest 2 View; Future    This note has been created with Surveyor, quantity. Any transcriptional errors are unintentional.   Kerin Perna, NP 10/12/2022, 11:40 AM

## 2022-10-18 NOTE — Telephone Encounter (Signed)
Advanced Heart Failure Patient Advocate Encounter  Called [employer] to check the status of the patient's application. Representative stated that they are missing POI in order to finish processing the patient's application.

## 2022-10-25 ENCOUNTER — Other Ambulatory Visit: Payer: Self-pay

## 2022-10-30 NOTE — Progress Notes (Incomplete)
ADVANCED HF CLINIC CONSULT NOTE  PCP: Juluis Mire, NP Primary Cardiologist: Buford Dresser, MD HF Cardiologist: Dr. Aundra Dubin  HPI: Justin [name] is a 42 y.o. Spanish-speaking male with history of chronic systolic CHF, hx uncontrolled HTN, DM II, mitral regurgitation, ETOH abuse, hx "crack"/cocaine use.   Heart failure dates back to 2017. Admitted to [facility] with acute systolic CHF and hypertensive urgency. Apparently echo showed severe LV dsyfunction and severe LVH. Cath with no CAD.   Admitted in 06/23 with acute respiratory failure with hypoxia requiring BiPAP 2/2 a/c CHF. He was diuresed and initiated on GDMT.    After discharge he no-showed TOC and cardiology clinic follow-ups.    Admitted 1/24 with a/c CHF, out of meds x 1 month. In AF with RVR in ED, converted to NSR. Started on Xarelto, diuresed with IV lasix and GDMT added back. Discharged home, weight 251 lbs.   Today he returns for post hospital HF follow up, here with interpretor. Overall feeling fine. He is not short of breath walking on flat ground or up steps. Able to push a cart grocery shopping without dyspnea. He is having a HA and non productive cough. He has + orthopnea. Denies palpitations, CP, dizziness, edema, or PND. Appetite ok. No fever or chills. He is not weighing at home but has a scale. Taking all medications. Lives with his sister. Works as a Curator. No family history of heart disease. No tobacco/ETOH recently, last used cocaine about 1 month ago.  ECG (personally reviewed): ST  109 bpm  Labs (1/24): K 4.0, creatinine 1.42   Cardiac studies - Echo (9/23): EF 15-20%, GIIIDD, RV mildly dilated and mod reduced function, Mod MR, trivial TR  - Echo (6/23): EF 30-35%, RV okay, mild to moderate MR.  - R/LHC (6/23): No significant CAD, RA mean 1, PA mean 19, PCWP 3, LVEDP 7, Fick CO/CI 4.17/1.99  - cMRI (02/20/22): LVEF 29%, LV severely dilated, asymmetric LVH with basal septum  measuring 17 mm, RVEF 45%, patchy LGE in setpum and RV insertion sites + subepicardial LGE in inferolateral wall. Scar pattern felt to be consistent with HCM. However, d/t subepicardial LGE in inferolateral wall and presence of mediastinal lymphadenopathy, cardiac PET recommended to r/o sarcoid, small pericardial effusion.   Echo (8/19): EF improved to 55%, severe LVH, RV okay   Review of Systems: [y] = yes, [ ]$  = no   General: Weight gain Blue.Reese ]; Weight loss [ ]$ ; Anorexia [ ]$ ; Fatigue [ ]$ ; Fever [ ]$ ; Chills [ ]$ ; Weakness [ ]$   Cardiac: Chest pain/pressure [ ]$ ; Resting SOB [ ]$ ; Exertional SOB [ ]$ ; Orthopnea [ y]; Pedal Edema [ ]$ ; Palpitations [ ]$ ; Syncope [ ]$ ; Presyncope [ ]$ ; Paroxysmal nocturnal dyspnea[ ]$   Pulmonary: Cough [ y]; Wheezing[ ]$ ; Hemoptysis[ ]$ ; Sputum [ ]$ ; Snoring Blue.Reese ]  GI: Vomiting[ ]$ ; Dysphagia[ ]$ ; Melena[ ]$ ; Hematochezia [ ]$ ; Heartburn[ ]$ ; Abdominal pain [ ]$ ; Constipation [ ]$ ; Diarrhea [ ]$ ; BRBPR [ ]$   GU: Hematuria[ ]$ ; Dysuria [ ]$ ; Nocturia[ ]$   Vascular: Pain in legs with walking [ ]$ ; Pain in feet with lying flat [ ]$ ; Non-healing sores [ ]$ ; Stroke [ ]$ ; TIA [ ]$ ; Slurred speech [ ]$ ;  Neuro: Headaches[ ]$ ; Vertigo[ ]$ ; Seizures[ ]$ ; Paresthesias[ ]$ ;Blurred vision [ ]$ ; Diplopia [ ]$ ; Vision changes [ ]$   Ortho/Skin: Arthritis [ ]$ ; Joint pain [ ]$ ; Muscle pain [ ]$ ; Joint swelling [ ]$ ; Back Pain [ ]$ ; Rash [ ]$   Psych:  Depression[ ]$ ; Anxiety[ ]$   Heme: Bleeding problems [ ]$ ; Clotting disorders [ ]$ ; Anemia [ ]$   Endocrine: Diabetes Blue.Reese ]; Thyroid dysfunction[ ]$    Past Medical History:  Diagnosis Date   Heart failure with reduced ejection fraction [facility]) 2017   [facility]   Hypertension 2017   [facility]   Current Outpatient Medications  Medication Sig Dispense Refill   empagliflozin (JARDIANCE) 10 MG TABS tablet Take 1 tablet (10 mg total) by mouth daily. 30 tablet 0   furosemide (LASIX) 40 MG tablet Take 1.5 tablets (60 mg total) by mouth daily. 30 tablet 0   metFORMIN  (GLUCOPHAGE) 1000 MG tablet Take 1 tablet (1,000 mg total) by mouth 2 (two) times daily with a meal. 180 tablet 1   metoprolol succinate (TOPROL-XL) 50 MG 24 hr tablet Take 50 mg by mouth daily. Take with or immediately following a meal.     rivaroxaban (XARELTO) 20 MG TABS tablet Take 1 tablet (20 mg total) by mouth daily with supper. 30 tablet 0   rosuvastatin (CRESTOR) 10 MG tablet Take 1 tablet (10 mg total) by mouth daily. 30 tablet 0   sacubitril-valsartan (ENTRESTO) 97-103 MG Take 1 tablet by mouth 2 (two) times daily. 60 tablet 8   spironolactone (ALDACTONE) 25 MG tablet Take 1/2 tablet (12.5 mg total) by mouth daily. 15 tablet 0   No current facility-administered medications for this visit.   No Known Allergies  Social History   Socioeconomic History   Marital status: Single    Spouse name: Not on file   Number of children: 3   Years of education: Not on file   Highest education level: 7th grade  Occupational History   Occupation: Curator    Comment: varies  Tobacco Use   Smoking status: Never   Smokeless tobacco: Never  Vaping Use   Vaping Use: Never used  Substance and Sexual Activity   Alcohol use: Yes    Alcohol/week: 30.0 standard drinks of alcohol    Types: 30 Cans of beer per week    Comment: 30-40 on weekends   Drug use: Not Currently    Types: "Crack" cocaine, Cocaine    Comment: 2 weeks   Sexual activity: Not on file  Other Topics Concern   Not on file  Social History Narrative   Not on file   Social Determinants of Health   Financial Resource Strain: Medium Risk (06/05/2022)   Overall Financial Resource Strain (CARDIA)    Difficulty of Paying Living Expenses: Somewhat hard  Food Insecurity: No Food Insecurity (09/22/2022)   Hunger Vital Sign    Worried About Running Out of Food in the Last Year: Never true    Ran Out of Food in the Last Year: Never true  Transportation Needs: Unmet Transportation Needs (09/22/2022)   PRAPARE - Armed forces logistics/support/administrative officer (Medical): Yes    Lack of Transportation (Non-Medical): Yes  Physical Activity: Not on file  Stress: Not on file  Social Connections: Not on file  Intimate Partner Violence: Not At Risk (09/22/2022)   Humiliation, Afraid, Rape, and Kick questionnaire    Fear of Current or Ex-Partner: No    Emotionally Abused: No    Physically Abused: No    Sexually Abused: No    Family History  Problem Relation Age of Onset   Hypertension Father    There were no vitals taken for this visit.  Wt Readings from Last 3 Encounters:  10/12/22 115.4  kg (254 lb 6.4 oz)  10/10/22 118.4 kg (261 lb)  09/26/22 114.2 kg (251 lb 12.3 oz)   PHYSICAL EXAM: General:  NAD. No resp difficulty, walked into clinic HEENT: Normal Neck: Supple. Thick neck, JVD difficult. Carotids 2+ bilat; no bruits. No lymphadenopathy or thryomegaly appreciated. Cor: PMI nondisplaced. Regular rate & rhythm. No rubs, gallops or murmurs. Lungs: Bibasilar crackles Abdomen: Soft, nontender, nondistended. No hepatosplenomegaly. No bruits or masses. Good bowel sounds. Extremities: No cyanosis, clubbing, rash, trace BLEedema Neuro: Alert & oriented x 3, cranial nerves grossly intact. Moves all 4 extremities w/o difficulty. Affect pleasant.  ASSESSMENT & PLAN: Chronic combined systolic and diastolic CHF: - EF severely reduced in 2017 in setting of uncontrolled HTN. EF recovered in 2019. - Echo (6/23): EF 30-35%, RV okay, mild to moderate MR. - R/LHC (6/23): No significant CAD, RA mean 1, PA mean 19, PCWP 3, LVEDP 7, Fick CO/CI 4.17/1.99 - cMRI (6/23): LVEF 29%, LV severely dilated, asymmetric LVH with basal septum measuring 17 mm, RVEF 45%, patchy LGE in setpum and RV insertion sites + subepicardial LGE in inferolateral wall. Scar pattern felt to be consistent with HCM. However, d/t subepicardial LGE in inferolateral wall and presence of mediastinal lymphadenopathy, cardiac PET recommended to r/o sarcoid, small  pericardial effusion.  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 Today, NYHA I. He is volume overloaded, weight up 10 lbs. - Increase Entresto to 97/103 mg bid. BMET and BNP today. Repeat labs at follow up in 2 weeks. - Increase Lasix to 60 mg daily. - Continue metoprolol 25 mg daily.  - Continue Jardiance 10 mg daily.  - Continue spiro 12.5 mg daily. 2. Atrial fibrillation: New. RVR during recent admission. ST on ECG today.  - Continue Xarelto. No bleeding issues. CBC today. - Continue Toprol. - Needs sleep study, but uninsured. 3. HTN: Uncontrolled. BP improving, but still elevated. BP on arrival to clinic 200/110-->144/82 after resting for 10 minutes. - Increase GDMT as above. - Given Rx for BP cuff, I asked him to check daily and log. 4. Polysubstance abuse: Previous ETOH and crack/cocaine use. He is no longer drinking ETOH. Last used occaine a month ago. - Cessation imperative, we discussed this. 5. DM II: Hgb A1c 7.8 . He is not on insulin. - Continue SGLT2i. No GU symptoms. - He has new patient PCP follow up soon. 6. CKD II: baseline SCr 1.3-1.5. - Continue SGLT2i. BMET today. 7. Hyperlipidemia: LDL 173 (9/23) - Continue statin + SGLT2i. 8. SDOH: HFSW helping with resources. His friend helps him with transportation. He has a scale and cell phone. - Pharmacy helping with patient assistance for Xarelto, Entresto and Jardiance.  Follow up in 2-3 weeks with APP and 3 months with Dr. Aundra Dubin + echo.  Allena Katz, FNP-BC 10/30/22

## 2022-11-01 ENCOUNTER — Encounter (HOSPITAL_COMMUNITY): Payer: Self-pay

## 2022-11-13 ENCOUNTER — Ambulatory Visit: Payer: Self-pay | Attending: Pharmacist | Admitting: Pharmacist

## 2022-11-14 ENCOUNTER — Other Ambulatory Visit (HOSPITAL_COMMUNITY): Payer: Self-pay

## 2022-11-16 ENCOUNTER — Emergency Department (HOSPITAL_COMMUNITY): Payer: Self-pay

## 2022-11-16 ENCOUNTER — Inpatient Hospital Stay (HOSPITAL_COMMUNITY)
Admission: EM | Admit: 2022-11-16 | Discharge: 2022-11-19 | DRG: 291 | Disposition: A | Discharge status: Home / Self Care | Payer: Self-pay | Attending: Internal Medicine | Admitting: Internal Medicine

## 2022-11-16 ENCOUNTER — Other Ambulatory Visit: Payer: Self-pay

## 2022-11-16 ENCOUNTER — Encounter (HOSPITAL_COMMUNITY): Payer: Self-pay

## 2022-11-16 DIAGNOSIS — E119 Type 2 diabetes mellitus without complications: Secondary | ICD-10-CM

## 2022-11-16 DIAGNOSIS — E785 Hyperlipidemia, unspecified: Secondary | ICD-10-CM | POA: Diagnosis present

## 2022-11-16 DIAGNOSIS — Z1152 Encounter for screening for COVID-19: Secondary | ICD-10-CM

## 2022-11-16 DIAGNOSIS — I509 Heart failure, unspecified: Secondary | ICD-10-CM

## 2022-11-16 DIAGNOSIS — Z91148 Patient's other noncompliance with medication regimen for other reason: Secondary | ICD-10-CM

## 2022-11-16 DIAGNOSIS — Z8249 Family history of ischemic heart disease and other diseases of the circulatory system: Secondary | ICD-10-CM

## 2022-11-16 DIAGNOSIS — Z7984 Long term (current) use of oral hypoglycemic drugs: Secondary | ICD-10-CM

## 2022-11-16 DIAGNOSIS — I480 Paroxysmal atrial fibrillation: Secondary | ICD-10-CM

## 2022-11-16 DIAGNOSIS — Z7901 Long term (current) use of anticoagulants: Secondary | ICD-10-CM

## 2022-11-16 DIAGNOSIS — I129 Hypertensive chronic kidney disease with stage 1 through stage 4 chronic kidney disease, or unspecified chronic kidney disease: Secondary | ICD-10-CM

## 2022-11-16 DIAGNOSIS — K219 Gastro-esophageal reflux disease without esophagitis: Secondary | ICD-10-CM | POA: Diagnosis present

## 2022-11-16 DIAGNOSIS — Z6841 Body Mass Index (BMI) 40.0 and over, adult: Secondary | ICD-10-CM

## 2022-11-16 DIAGNOSIS — Z603 Acculturation difficulty: Secondary | ICD-10-CM | POA: Diagnosis present

## 2022-11-16 DIAGNOSIS — N182 Chronic kidney disease, stage 2 (mild): Secondary | ICD-10-CM

## 2022-11-16 DIAGNOSIS — F1411 Cocaine abuse, in remission: Secondary | ICD-10-CM | POA: Diagnosis present

## 2022-11-16 DIAGNOSIS — J9601 Acute respiratory failure with hypoxia: Secondary | ICD-10-CM

## 2022-11-16 DIAGNOSIS — Z79899 Other long term (current) drug therapy: Secondary | ICD-10-CM

## 2022-11-16 DIAGNOSIS — E1122 Type 2 diabetes mellitus with diabetic chronic kidney disease: Secondary | ICD-10-CM | POA: Diagnosis present

## 2022-11-16 DIAGNOSIS — E669 Obesity, unspecified: Secondary | ICD-10-CM | POA: Diagnosis present

## 2022-11-16 DIAGNOSIS — I5043 Acute on chronic combined systolic (congestive) and diastolic (congestive) heart failure: Secondary | ICD-10-CM | POA: Diagnosis present

## 2022-11-16 DIAGNOSIS — I130 Hypertensive heart and chronic kidney disease with heart failure and stage 1 through stage 4 chronic kidney disease, or unspecified chronic kidney disease: Principal | ICD-10-CM | POA: Diagnosis present

## 2022-11-16 DIAGNOSIS — I10 Essential (primary) hypertension: Secondary | ICD-10-CM

## 2022-11-16 DIAGNOSIS — F1011 Alcohol abuse, in remission: Secondary | ICD-10-CM | POA: Diagnosis present

## 2022-11-16 LAB — RESP PANEL BY RT-PCR (RSV, FLU A&B, COVID)  RVPGX2
Influenza A by PCR: NEGATIVE
Influenza B by PCR: NEGATIVE
Resp Syncytial Virus by PCR: NEGATIVE
SARS Coronavirus 2 by RT PCR: NEGATIVE

## 2022-11-16 LAB — CBC
HCT: 51.1 % (ref 39.0–52.0)
Hemoglobin: 16.5 g/dL (ref 13.0–17.0)
MCH: 27.4 pg (ref 26.0–34.0)
MCHC: 32.3 g/dL (ref 30.0–36.0)
MCV: 84.7 fL (ref 80.0–100.0)
Platelets: 296 10*3/uL (ref 150–400)
RBC: 6.03 MIL/uL — ABNORMAL HIGH (ref 4.22–5.81)
RDW: 14.1 % (ref 11.5–15.5)
WBC: 8.2 10*3/uL (ref 4.0–10.5)
nRBC: 0 % (ref 0.0–0.2)

## 2022-11-16 LAB — MAGNESIUM: Magnesium: 1.8 mg/dL (ref 1.7–2.4)

## 2022-11-16 LAB — RAPID URINE DRUG SCREEN, HOSP PERFORMED
Amphetamines: NOT DETECTED
Barbiturates: NOT DETECTED
Benzodiazepines: NOT DETECTED
Cocaine: NOT DETECTED
Opiates: NOT DETECTED
Tetrahydrocannabinol: NOT DETECTED

## 2022-11-16 LAB — COMPREHENSIVE METABOLIC PANEL
ALT: 15 U/L (ref 0–44)
AST: 23 U/L (ref 15–41)
Albumin: 2.9 g/dL — ABNORMAL LOW (ref 3.5–5.0)
Alkaline Phosphatase: 93 U/L (ref 38–126)
Anion gap: 11 (ref 5–15)
BUN: 16 mg/dL (ref 6–20)
CO2: 22 mmol/L (ref 22–32)
Calcium: 8.9 mg/dL (ref 8.9–10.3)
Chloride: 101 mmol/L (ref 98–111)
Creatinine, Ser: 1.41 mg/dL — ABNORMAL HIGH (ref 0.61–1.24)
GFR, Estimated: >60 mL/min (ref >60)
Glucose, Bld: 264 mg/dL — ABNORMAL HIGH (ref 70–99)
Potassium: 3.6 mmol/L (ref 3.5–5.1)
Sodium: 134 mmol/L — ABNORMAL LOW (ref 135–145)
Total Bilirubin: 1.3 mg/dL — ABNORMAL HIGH (ref 0.3–1.2)
Total Protein: 6.8 g/dL (ref 6.5–8.1)

## 2022-11-16 LAB — GLUCOSE, CAPILLARY: Glucose-Capillary: 233 mg/dL — ABNORMAL HIGH (ref 70–99)

## 2022-11-16 LAB — LACTIC ACID, PLASMA
Lactic Acid, Venous: 1.3 mmol/L (ref 0.5–1.9)
Lactic Acid, Venous: 2 mmol/L (ref 0.5–1.9)
Lactic Acid, Venous: 1.5 mmol/L (ref 0.5–1.9)

## 2022-11-16 LAB — BRAIN NATRIURETIC PEPTIDE: B Natriuretic Peptide: 765.5 pg/mL — ABNORMAL HIGH (ref 0.0–100.0)

## 2022-11-16 LAB — TROPONIN I (HIGH SENSITIVITY): Troponin I (High Sensitivity): 44 ng/L — ABNORMAL HIGH (ref <18)

## 2022-11-16 MED ORDER — NITROGLYCERIN 0.4 MG SL SUBL: 0.4000 mg | SUBLINGUAL_TABLET | SUBLINGUAL | Status: DC | PRN

## 2022-11-16 MED ORDER — SPIRONOLACTONE 12.5 MG HALF TABLET
12.5000 mg | ORAL_TABLET | Freq: Every day | ORAL | Status: DC
  Administered 2022-11-16 – 2022-11-19 (×4): 12.5 mg via ORAL
  Filled 2022-11-16 (×4): qty 1

## 2022-11-16 MED ORDER — HYDRALAZINE HCL 20 MG/ML IJ SOLN
10.0000 mg | Freq: Once | INTRAMUSCULAR | Status: AC
  Administered 2022-11-16: 10 mg via INTRAVENOUS
  Filled 2022-11-16: qty 1

## 2022-11-16 MED ORDER — FUROSEMIDE 40 MG PO TABS
100.0000 mg | ORAL_TABLET | Freq: Two times a day (BID) | ORAL | Status: DC
  Administered 2022-11-16: 100 mg via ORAL
  Filled 2022-11-16: qty 1

## 2022-11-16 MED ORDER — METOPROLOL SUCCINATE ER 25 MG PO TB24
50.0000 mg | ORAL_TABLET | Freq: Once | ORAL | Status: AC
  Administered 2022-11-16: 50 mg via ORAL
  Filled 2022-11-16: qty 2

## 2022-11-16 MED ORDER — RIVAROXABAN 10 MG PO TABS
20.0000 mg | ORAL_TABLET | Freq: Once | ORAL | Status: AC
  Administered 2022-11-16: 20 mg via ORAL
  Filled 2022-11-16: qty 2

## 2022-11-16 MED ORDER — SPIRONOLACTONE 12.5 MG HALF TABLET
12.5000 mg | ORAL_TABLET | Freq: Once | ORAL | Status: AC
  Administered 2022-11-16: 12.5 mg via ORAL
  Filled 2022-11-16: qty 1

## 2022-11-16 MED ORDER — RIVAROXABAN 20 MG PO TABS
20.0000 mg | ORAL_TABLET | Freq: Every day | ORAL | Status: DC
  Administered 2022-11-16 – 2022-11-18 (×3): 20 mg via ORAL
  Filled 2022-11-16 (×3): qty 1

## 2022-11-16 MED ORDER — GUAIFENESIN 100 MG/5ML PO LIQD
5.0000 mL | ORAL | Status: DC | PRN
  Administered 2022-11-16: 5 mL via ORAL
  Filled 2022-11-16: qty 10

## 2022-11-16 MED ORDER — ROSUVASTATIN CALCIUM 5 MG PO TABS
10.0000 mg | ORAL_TABLET | Freq: Every day | ORAL | Status: DC
  Administered 2022-11-16 – 2022-11-19 (×4): 10 mg via ORAL
  Filled 2022-11-16 (×4): qty 2

## 2022-11-16 MED ORDER — EMPAGLIFLOZIN 10 MG PO TABS: 10.0000 mg | ORAL_TABLET | Freq: Every day | ORAL | Status: DC

## 2022-11-16 MED ORDER — INSULIN ASPART 100 UNIT/ML IJ SOLN
0.0000 [IU] | Freq: Three times a day (TID) | INTRAMUSCULAR | Status: DC
  Administered 2022-11-17: 2 [IU] via SUBCUTANEOUS
  Administered 2022-11-17: 3 [IU] via SUBCUTANEOUS
  Administered 2022-11-17 – 2022-11-18 (×3): 2 [IU] via SUBCUTANEOUS
  Administered 2022-11-18 – 2022-11-19 (×2): 1 [IU] via SUBCUTANEOUS

## 2022-11-16 MED ORDER — NITROGLYCERIN 2 % TD OINT
1.0000 [in_us] | TOPICAL_OINTMENT | Freq: Once | TRANSDERMAL | Status: AC
  Administered 2022-11-16: 1 [in_us] via TOPICAL
  Filled 2022-11-16: qty 1

## 2022-11-16 MED ORDER — SACUBITRIL-VALSARTAN 97-103 MG PO TABS
1.0000 | ORAL_TABLET | Freq: Once | ORAL | Status: AC
  Administered 2022-11-16: 1 via ORAL
  Filled 2022-11-16: qty 1

## 2022-11-16 MED ORDER — FUROSEMIDE 20 MG PO TABS
80.0000 mg | ORAL_TABLET | Freq: Once | ORAL | Status: AC
  Administered 2022-11-16: 80 mg via ORAL
  Filled 2022-11-16: qty 4

## 2022-11-16 MED ORDER — SACUBITRIL-VALSARTAN 97-103 MG PO TABS
1.0000 | ORAL_TABLET | Freq: Two times a day (BID) | ORAL | Status: DC
  Administered 2022-11-16 – 2022-11-19 (×6): 1 via ORAL
  Filled 2022-11-16 (×6): qty 1

## 2022-11-16 MED ORDER — FUROSEMIDE 10 MG/ML IJ SOLN
100.0000 mg | Freq: Two times a day (BID) | INTRAVENOUS | Status: DC
  Filled 2022-11-16 (×2): qty 10

## 2022-11-16 MED ORDER — EMPAGLIFLOZIN 10 MG PO TABS
10.0000 mg | ORAL_TABLET | Freq: Every day | ORAL | Status: DC
  Administered 2022-11-16 – 2022-11-19 (×4): 10 mg via ORAL
  Filled 2022-11-16 (×4): qty 1

## 2022-11-16 NOTE — Progress Notes (Signed)
Patient lactic acid level 2.0. Dr. Sherrye Payor notified. Plan to recheck level.Will continue with current plan of care.

## 2022-11-16 NOTE — ED Triage Notes (Signed)
Spanish interpreter used for triage: [phone].   Pt reports difficulty breathing and bilateral feet swelling for the past 5 days. Pt ran out of his meds about 5 days ago as well. Pt also reports cough with sore throat. Denies fever or chest pain

## 2022-11-16 NOTE — Progress Notes (Incomplete)
Subjective:  Justin [name] xxx    Objective: Vitals over previous 24hr: Vitals:   11/16/22 1515 11/16/22 1530 11/16/22 1545 11/16/22 1612  BP: (!) 141/102 136/88    Pulse: 90 (!) 102 85   Resp: (!) 21  (!) 23   Temp:      TempSrc:      SpO2: 95% 94% 94%   Weight:    126.9 kg    General:      awake and alert, lying comfortably in bed, cooperative, not in acute distress Skin:       warm and dry, intact without any obvious lesions or scars, no rashes Head:      normocephalic and atraumatic, oral mucosa moist with good dentition, no lymphadenopathy Eyes:      extraocular movements intact, conjunctivae pink, pupils round and reactive to light, no periorbital swelling or scleral icterus Ears:       pinnae normal, no discharge or external lesions  Nose:      symmetrical and without mucosal inflammation, no external lesions or discharge Lungs:      normal respiratory effort, breathing unlabored, symmetrical chest rise, no crackles or wheezing Cardiac:      regular rate and rhythm, normal S1 and S2, capillary refill 2-3 seconds, no pitting edema Abdomen:      soft and non-distended, normoactive bowel sounds present in all four quadrants, no tenderness to palpation or guarding Musculoskeletal:  full range of motion in joints, motor strength 5 /5 in all four extremities, no obvious deformities Neurologic:      oriented to person-place-time, moving all extremities, sensation to light touch intact, no gross focal deficits Psychiatric:      euthymic mood with congruent affect, intelligible speech    Assessment/Plan: Justin [name] is a 42 year old male with a past medical history of obesity III, hypertension, diabetes II, paroxysmal AFib with RVR, CKD II, and CHF who presented with breath shortness, now admitted for acute hypoxic respiratory failure secondary to heart failure exacerbation.    ---Acute respiratory failure ---Heart failure reduced ejection fraction  exacerbation Patient has history of heart failure reduced ejection fraction with most recent echocardiogram performed EF 15-20, global hypokinesis, grade III diastolic dysfunction, moderate-severe MV regurgitation, and severely enlarged LA. He presented with breath shortness and leg swelling after discontinuing his home medications, hospitalized in 09-2022 under similar circumstances. Previous dry weight of 114kg. Upon arrival, patient was requiring 2L/min [place]. Laboratory testing demonstrated BNP 766 and Trop 44. Chest radiograph revealed pulmonary edema. Exam notable for significant volume overload and increased respiratory effort. Presentation consistent with acute heart failure exacerbation in setting of medication non-adherence. Severe hypertension likely contributing as well. Low concern for coronary artery disease, slightly elevated Trop consistent with demand ischemia. Intravenous furosemide has been started. Total fluid loss since admission XXL with XXL over last 24hr.  > Furosemide '100mg'$  IV q12  > xxx  > xxx  > xxx  > xxx  > xxx   ---Paroxysmal atrial fibrillation xxx  > xxx  > xxx  > xxx  > xxx  > xxx   ---Essential hypertension xxx  > xxx  > xxx  > xxx  > xxx   ---Chronic kidney disease II xxx  > xxx  > xxx  > xxx   ---Diabetes II xxx  > xxx  > xxx  > xxx   ---Hyperlipidemia  Patient has history of hyperlipidemia managed at home with xxx  > xxx  > xxx  > xxx  Principal Problem:   Acute exacerbation of CHF (congestive heart failure) (HCC)  ***   Prior to Admission Living Arrangement:  Anticipated Discharge Location:  Barriers to Discharge:  Dispo: Anticipated discharge in approximately *** day(s).    Roswell Nickel, MD Internal Medicine PGY-1 Pager [phone]  After 5pm on weekdays and 1pm on weekends: On Call pager [phone]

## 2022-11-16 NOTE — ED Notes (Signed)
Pt states that he was seen here a couple weeks ago and not given any refills therefore he has not taken any of his medication.  He states he does not have a PCP

## 2022-11-16 NOTE — Progress Notes (Signed)
Assumed care at ED from 1800. Oriented to unit. Aox4, tele monitored, no complaints of CP/SOB.  Admission documentation complete. RN skin check complete. Full assessment per flowsheets, Meds given per [facility]. Safety maintained, call bell in reach, all needs made know.

## 2022-11-16 NOTE — H&P (Addendum)
Date: 11/16/2022               Patient Name:  Justin [name] MRN: [id number]  DOB: 1980-12-12 Age / Sex: 42 y.o., male   PCP: Kerin Perna, NP         Medical Service: Internal Medicine Teaching Service         Attending Physician: Dr. Lottie Mussel, MD      First Contact: Dr. Roswell Nickel, MD Pager [phone]    Second Contact: Dr. Madelyn Flavors, MD Pager [phone]         After Hours (After 5p/  First Contact Pager: [phone]  weekends / holidays): Second Contact Pager: [phone]   SUBJECTIVE   Chief Complaint: Shortness of breath  History of Present Illness:   A spanish interpretor was used during this encounter.   Justin [name] is a 42 y/o person living with a history of combined HF, GERD, T2DM, Atrial fibrillation, CKD II, HTN, a alcohol and cocaine use, and HLD who presented with worsening shortness of breath and lower extremity swelling. He notes he ran out of his home medications 5 days ago and since then he has noticed increasing shortness of breath. He is unable to lye flat and needs 4-5 pillows. Swelling has increased in his abdomen and lower extremities. He denies any chest pain. He has had an associated productive cough with green phlegm. He denies headache, fever, chills. He usually is able to walk short distances without feeling short of breath.   Meds:  Current Meds  Medication Sig   empagliflozin (JARDIANCE) 10 MG TABS tablet Take 1 tablet (10 mg total) by mouth daily.   furosemide (LASIX) 40 MG tablet Take 1.5 tablets (60 mg total) by mouth daily.   metFORMIN (GLUCOPHAGE) 1000 MG tablet Take 1 tablet (1,000 mg total) by mouth 2 (two) times daily with a meal.   metoprolol succinate (TOPROL-XL) 50 MG 24 hr tablet Take 50 mg by mouth daily. Take with or immediately following a meal.   rivaroxaban (XARELTO) 20 MG TABS tablet Take 1 tablet (20 mg total) by mouth daily with supper.   rosuvastatin (CRESTOR) 10 MG tablet Take 1 tablet (10 mg total) by mouth  daily.   sacubitril-valsartan (ENTRESTO) 97-103 MG Take 1 tablet by mouth 2 (two) times daily.   spironolactone (ALDACTONE) 25 MG tablet Take 1/2 tablet (12.5 mg total) by mouth daily.   Past Medical History Combined CHF GERD Diabetes Atrial fibrillation CKD II HTN Alcohol use Cocaine use HLD  Past Surgical History:  Procedure Laterality Date   RIGHT/LEFT HEART CATH AND CORONARY ANGIOGRAPHY N/A 02/16/2022   Procedure: RIGHT/LEFT HEART CATH AND CORONARY ANGIOGRAPHY;  Surgeon: Sherren Mocha, MD;  Location: [facility];  Service: Cardiovascular;  Laterality: N/A;   Social:  Lives with his sister Occupation: Curator  Support: Sister in the area Level of Function: Able to perform ADL/iADL without assistance PCP: Juluis Mire NP Substances: Prior history of drinking 24 beers daily on the weekend. He stopped 6 months ago. Never had alcohol withdrawal. Denies cigarette use. Hx of cocaine use, las used 6 months ago. He would snort the cocaine. Denies ever injecting substances  Family History  Problem Relation Age of Onset   Hypertension Father   Denies family history of cardiac disease  Allergies: Allergies as of 11/16/2022   (No Known Allergies)   Review of Systems: A complete ROS was negative except as per HPI.   OBJECTIVE:   Physical Exam: Blood pressure Marland Kitchen)  168/109, pulse (!) 104, temperature 98.3 F (36.8 C), temperature source Oral, resp. rate (!) 22, SpO2 95 %.  Constitutional: no acute distress HENT: normocephalic atraumatic, mucous membranes moist Eyes: conjunctiva non-erythematous Neck: supple Cardiovascular: regular rate and rhythm, with systolic murmur left 5th intercostal space. JVD to the angle of the mandible. 2+ lower extremity edema Pulmonary/Chest: normal work of breathing on 2L [place]. Diffuse crackles. Abdominal: soft, non-tender, non-distended MSK: normal bulk and tone Neurological: alert & oriented x 3 Skin: warm and dry lower extremities.   Psych: normal mood  Labs: CBC    Latest Ref Rng & Units 11/16/2022    9:33 AM 10/10/2022    9:44 AM 09/22/2022    5:30 AM  CBC  WBC 4.0 - 10.5 K/uL 8.2  4.0  8.6   Hemoglobin 13.0 - 17.0 g/dL 16.5  16.5  15.4   Hematocrit 39.0 - 52.0 % 51.1  52.2  49.4   Platelets 150 - 400 K/uL 296  249  337    CMP     Latest Ref Rng & Units 11/16/2022    9:33 AM 10/10/2022    9:44 AM 09/26/2022   12:48 AM  CMP  Glucose 70 - 99 mg/dL 264  270  135   BUN 6 - 20 mg/dL '16  21  22   '$ Creatinine 0.61 - 1.24 mg/dL 1.41  1.51  1.42   Sodium 135 - 145 mmol/L 134  136  137   Potassium 3.5 - 5.1 mmol/L 3.6  3.4  4.0   Chloride 98 - 111 mmol/L 101  101  95   CO2 22 - 32 mmol/L 22  24  32   Calcium 8.9 - 10.3 mg/dL 8.9  9.1  9.3   Total Protein 6.5 - 8.1 g/dL 6.8     Total Bilirubin 0.3 - 1.2 mg/dL 1.3     Alkaline Phos 38 - 126 U/L 93     AST 15 - 41 U/L 23     ALT 0 - 44 U/L 15      Chest xray  Marked cardiomegaly. Pulmonary vascular congestion with diffuse bilateral interstitial opacities. No large pleural fluid collection, although trace bilateral pleural effusions are not excluded. No pneumothorax.  EKG: personally reviewed my interpretation is normal sinus rhythm with a rate of 90 bpm. PAC vs u waves present. LVH. Prior EKG from 10/10/22 without significant change.   ASSESSMENT & PLAN:   Assessment & Plan by Problem: Principal Problem:   Acute exacerbation of CHF (congestive heart failure) (HCC)  Justin [name] is a 42 y.o. person living with a history of combined HF, GERD, T2DM, Atrial fibrillation, CKD II, HTN, Alcohol and cocaine use, and HLD who presented with shortness of breath and admitted for acute hypoxic respiratory failure and a CHF exacerbation on hospital day 0   Acute on chronic heart failure exacerbation Acute hypoxic respiratory failure Hx of combined systolic/diastolic heart failure, last EF of 15-20%. NYHA IV on admission today. He follows with advanced heart  failure is on appropriate GDMT. Etiology of his HF thought to be due to long standing history of uncontrolled HTN and substance. Has not had cardiac PET yet. He presents with acute hypoxia secondary to hypervolemia. He has been out of his GDMT and diuretic therapy for the past 5 days. He notes prior to this he felt fine. He has new orthopnea and dyspnea at rest. On exam he is grossly volume overloaded and requiring 2L [place]. Chest xray consistent  with hypervolemia, no focal opacity. BNP elevated at 765, last HF exacerbation in the 800-900's. Weight today of 120 kg, last discharge dry weight of 114 kg. Suspected HF exacerbation in setting of being out of his medications and severe symptomatic hypertension. No evidence of ischemic changes on EKG, troponin low and flat. Do not suspect low output state or need for inotropes; extremities are warm, he is mentating well and with a normal lactate. No evidence of other organ dysfunction. Do not suspect infectious etiology, normal WBC and afebrile. Low suspicion for pulmonary embolism.  -IV lasix 100 mg BID -hold metoprolol until echocardiogram with histoy of low EF -restart entresto 200 BID, jardiance 10 mg daily, and spiro 12.5 mg daily -echocardiogram pending -daily weights (last dry weight 114kg, admission weight 120kg) -strict I/o -daily electrolytes -wean oxygen  Paroxysmal atrial fibrillation Hx of PAF, Sinus rhythm today.  -continue home xarelto -restart home metoprolol tomorrow, rate controlled today.   Essential hypertension On admission BP elevated at 188/123. Improved since restarting home medications.  -continue home entresto, spironolactone  CKD II Baseline Cr of 1.4-1.7. Today 1.41 with GFR of over 60.  -continue to montior  Type 2 DM On metformin at home. A1c one month ago at 7.8%.  -continue home farxiga -follow up A1c in two months with pcp  HLD -continue home statin  Polysubstance use disorder History of significant alcohol and  cocaine use, has not used in the last 6 months. Congratulated him on this.   Diet: Heart Healthy VTE: DOAC IVF: None,None Code: Full  Prior to Admission Living Arrangement: Home, living family Anticipated Discharge Location: Home Barriers to Discharge: further diuresis   Dispo: Admit patient to Inpatient with expected length of stay greater than 2 midnights.  Signed: Riesa Pope, MD Internal Medicine Resident PGY-3  11/16/2022, 6:00 PM

## 2022-11-16 NOTE — ED Notes (Signed)
ED TO INPATIENT HANDOFF REPORT  ED Nurse Name and Phone #:  jenny [phone]  S Name/Age/Gender Justin [name] 42 y.o. male Room/Bed: 046C/046C  Code Status   Code Status: Full Code  Home/SNF/Other Home Patient oriented to: self, place, time, and situation Is this baseline? Yes   Triage Complete: Triage complete  Chief Complaint Acute exacerbation of CHF (congestive heart failure) ([facility]) [I50.9]  Triage Note Spanish interpreter used for triage: [phone].   Pt reports difficulty breathing and bilateral feet swelling for the past 5 days. Pt ran out of his meds about 5 days ago as well. Pt also reports cough with sore throat. Denies fever or chest pain   Allergies No Known Allergies  Level of Care/Admitting Diagnosis ED Disposition     ED Disposition  Admit   Condition  --   Comment  Hospital Area: [facility] [[id number]]  Level of Care: Telemetry Cardiac [103]  May admit patient to [facility] or [facility] if equivalent level of care is available:: No  Covid Evaluation: Asymptomatic - no recent exposure (last 10 days) testing not required  Diagnosis: Acute exacerbation of CHF (congestive heart failure) [facility]) [id number]  Admitting Physician: Lottie Mussel [id number]  Attending Physician: Lottie Mussel [id number]  Certification:: I certify this patient will need inpatient services for at least 2 midnights  Estimated Length of Stay: 3          B Medical/Surgery History Past Medical History:  Diagnosis Date   Heart failure with reduced ejection fraction ([place]) 2017   [facility]   Hypertension 2017   [facility]   Past Surgical History:  Procedure Laterality Date   RIGHT/LEFT HEART CATH AND CORONARY ANGIOGRAPHY N/A 02/16/2022   Procedure: RIGHT/LEFT HEART CATH AND CORONARY ANGIOGRAPHY;  Surgeon: Sherren Mocha, MD;  Location: [facility];  Service: Cardiovascular;  Laterality: N/A;     A IV  Location/Drains/Wounds Patient Lines/Drains/Airways Status     Active Line/Drains/Airways     Name Placement date Placement time Site Days   Peripheral IV 11/16/22 20 G Right Antecubital 11/16/22  0936  Antecubital  less than 1            Intake/Output Last 24 hours  Intake/Output Summary (Last 24 hours) at 11/16/2022 1659 Last data filed at 11/16/2022 1604 Gross per 24 hour  Intake --  Output 1100 ml  Net -1100 ml    Labs/Imaging Results for orders placed or performed during the hospital encounter of 11/16/22 (from the past 48 hour(s))  Comprehensive metabolic panel     Status: Abnormal   Collection Time: 11/16/22  9:33 AM  Result Value Ref Range   Sodium 134 (L) 135 - 145 mmol/L   Potassium 3.6 3.5 - 5.1 mmol/L   Chloride 101 98 - 111 mmol/L   CO2 22 22 - 32 mmol/L   Glucose, Bld 264 (H) 70 - 99 mg/dL    Comment: Glucose reference range applies only to samples taken after fasting for at least 8 hours.   BUN 16 6 - 20 mg/dL   Creatinine, Ser 1.41 (H) 0.61 - 1.24 mg/dL   Calcium 8.9 8.9 - 10.3 mg/dL   Total Protein 6.8 6.5 - 8.1 g/dL   Albumin 2.9 (L) 3.5 - 5.0 g/dL   AST 23 15 - 41 U/L   ALT 15 0 - 44 U/L   Alkaline Phosphatase 93 38 - 126 U/L   Total Bilirubin 1.3 (H) 0.3 - 1.2 mg/dL  GFR, Estimated >60 >60 mL/min    Comment: (NOTE) Calculated using the CKD-EPI Creatinine Equation (2021)    Anion gap 11 5 - 15    Comment: Performed at [facility], [place] [address], [place], [place] [address]  Brain natriuretic peptide     Status: Abnormal   Collection Time: 11/16/22  9:33 AM  Result Value Ref Range   B Natriuretic Peptide 765.5 (H) 0.0 - 100.0 pg/mL    Comment: Performed at [facility] [address], [place], [place] [address]  CBC     Status: Abnormal   Collection Time: 11/16/22  9:33 AM  Result Value Ref Range   WBC 8.2 4.0 - 10.5 K/uL   RBC 6.03 (H) 4.22 - 5.81 MIL/uL   Hemoglobin 16.5 13.0 - 17.0 g/dL   HCT 51.1 39.0 - 52.0 %   MCV 84.7  80.0 - 100.0 fL   MCH 27.4 26.0 - 34.0 pg   MCHC 32.3 30.0 - 36.0 g/dL   RDW 14.1 11.5 - 15.5 %   Platelets 296 150 - 400 K/uL   nRBC 0.0 0.0 - 0.2 %    Comment: Performed at [facility], [place] [address], [place], [place] [address]  Troponin I (High Sensitivity)     Status: Abnormal   Collection Time: 11/16/22  9:33 AM  Result Value Ref Range   Troponin I (High Sensitivity) 44 (H) <18 ng/L    Comment: (NOTE) Elevated high sensitivity troponin I (hsTnI) values and significant  changes across serial measurements may suggest ACS but many other  chronic and acute conditions are known to elevate hsTnI results.  Refer to the "Links" section for chest pain algorithms and additional  guidance. Performed at [facility], [place] [address], [place], [place] [address]   Resp panel by RT-PCR (RSV, Flu A&B, Covid) Anterior Nasal Swab     Status: None   Collection Time: 11/16/22  9:34 AM   Specimen: Anterior Nasal Swab  Result Value Ref Range   SARS Coronavirus 2 by RT PCR NEGATIVE NEGATIVE   Influenza A by PCR NEGATIVE NEGATIVE   Influenza B by PCR NEGATIVE NEGATIVE    Comment: (NOTE) The Xpert Xpress SARS-CoV-2/FLU/RSV plus assay is intended as an aid in the diagnosis of influenza from Nasopharyngeal swab specimens and should not be used as a sole basis for treatment. Nasal washings and aspirates are unacceptable for Xpert Xpress SARS-CoV-2/FLU/RSV testing.  Fact Sheet for Patients: EntrepreneurPulse.com.au  Fact Sheet for Healthcare Providers: IncredibleEmployment.be  This test is not yet approved or cleared by the [place] FDA and has been authorized for detection and/or diagnosis of SARS-CoV-2 by FDA under an Emergency Use Authorization (EUA). This EUA will remain in effect (meaning this test can be used) for the duration of the COVID-19 declaration under Section 564(b)(1) of the Act, 21 U.S.C. section 360bbb-3(b)(1), unless the  authorization is terminated or revoked.     Resp Syncytial Virus by PCR NEGATIVE NEGATIVE    Comment: (NOTE) Fact Sheet for Patients: EntrepreneurPulse.com.au  Fact Sheet for Healthcare Providers: IncredibleEmployment.be  This test is not yet approved or cleared by the [place] FDA and has been authorized for detection and/or diagnosis of SARS-CoV-2 by FDA under an Emergency Use Authorization (EUA). This EUA will remain in effect (meaning this test can be used) for the duration of the COVID-19 declaration under Section 564(b)(1) of the Act, 21 U.S.C. section 360bbb-3(b)(1), unless the authorization is terminated or revoked.  Performed at [facility], [place] [address] [place], [place] [address]   Magnesium     Status: None   Collection Time: 11/16/22  9:47 AM  Result Value Ref Range   Magnesium 1.8 1.7 - 2.4 mg/dL    Comment: Performed at [facility] [address] [place],  [address]   DG Chest Port 1 View  Result Date: 11/16/2022 CLINICAL DATA:  Shortness of breath EXAM: PORTABLE CHEST 1 VIEW COMPARISON:  09/21/2022 FINDINGS: Marked cardiomegaly. Pulmonary vascular congestion with diffuse bilateral interstitial opacities. No large pleural fluid collection, although trace bilateral pleural effusions are not excluded. No pneumothorax. IMPRESSION: Findings of CHF with pulmonary edema. Electronically Signed   By: Davina Poke D.O.   On: 11/16/2022 09:43    Pending Labs Unresulted Labs (From admission, onward)     Start     Ordered   11/16/22 1521  Lactic acid, plasma  STAT Now then every 3 hours,   R (with STAT occurrences)      11/16/22 1520            Vitals/Pain Today's Vitals   11/16/22 1515 11/16/22 1530 11/16/22 1545 11/16/22 1612  BP: (!) 141/102 136/88    Pulse: 90 (!) 102 85   Resp: (!) 21  (!) 23   Temp:      TempSrc:      SpO2: 95% 94% 94%   Weight:    126.9 kg  PainSc:        Isolation  Precautions No active isolations  Medications Medications  nitroGLYCERIN (NITROSTAT) SL tablet 0.4 mg (has no administration in time range)  furosemide (LASIX) tablet 100 mg (has no administration in time range)  furosemide (LASIX) tablet 80 mg (80 mg Oral Given 11/16/22 1015)  metoprolol succinate (TOPROL-XL) 24 hr tablet 50 mg (50 mg Oral Given 11/16/22 1016)  sacubitril-valsartan (ENTRESTO) 97-103 mg per tablet (1 tablet Oral Given 11/16/22 1016)  rivaroxaban (XARELTO) tablet 20 mg (20 mg Oral Given 11/16/22 1016)  spironolactone (ALDACTONE) tablet 12.5 mg (12.5 mg Oral Given 11/16/22 1017)  hydrALAZINE (APRESOLINE) injection 10 mg (10 mg Intravenous Given 11/16/22 1119)  nitroGLYCERIN (NITROGLYN) 2 % ointment 1 inch (1 inch Topical Given 11/16/22 1125)    Mobility walks     Focused Assessments Cardiac Assessment Handoff:  Cardiac Rhythm: Sinus tachycardia No results found for: "CKTOTAL", "CKMB", "CKMBINDEX", "TROPONINI" No results found for: "DDIMER" Does the Patient currently have chest pain? No   , Renal Assessment Handoff:       R Recommendations: See Admitting Provider Note  Report given to:   Additional Notes:

## 2022-11-16 NOTE — Progress Notes (Signed)
Heart Failure Navigator Progress Note  Assessed for Heart & Vascular TOC clinic readiness.  Patient does not meet criteria due to Advanced Heart Failure Team patient of Dr. Bensimhon.   Navigator will sign off at this time.   Yumiko Alkins, BSN, RN Heart Failure Nurse Navigator Secure Chat Only   

## 2022-11-16 NOTE — ED Provider Notes (Addendum)
[facility] Provider Note   CSN: [id number] Arrival date & time: 11/16/22  [id number]     History  Chief Complaint  Patient presents with   Shortness of Breath   Leg Swelling    Justin [name] is a 42 y.o. male.  Patient with hx chf, afib, c/o sob in past 5 days. Also states ran out of his normal meds 5 days ago. Symptoms acute onset, moderate, persistent. Occasional non prod cough. No sore throat or runny nose. No fevers. No known ill contacts. Denies any chest pain or discomfort. +increased bilateral leg/ankle swelling. +orthopnea.   The history is provided by the patient and medical records. A language interpreter was used (AV interpreter service used).  Shortness of Breath Associated symptoms: cough   Associated symptoms: no abdominal pain, no chest pain, no fever, no headaches, no neck pain, no rash, no sore throat and no vomiting        Home Medications Prior to Admission medications   Medication Sig Start Date End Date Taking? Authorizing Provider  empagliflozin (JARDIANCE) 10 MG TABS tablet Take 1 tablet (10 mg total) by mouth daily. 09/26/22  Yes Arrien, Jimmy Picket, MD  furosemide (LASIX) 40 MG tablet Take 1.5 tablets (60 mg total) by mouth daily. 10/10/22  Yes [place], Maricela Bo, FNP  metFORMIN (GLUCOPHAGE) 1000 MG tablet Take 1 tablet (1,000 mg total) by mouth 2 (two) times daily with a meal. 10/12/22  Yes Kerin Perna, NP  metoprolol succinate (TOPROL-XL) 50 MG 24 hr tablet Take 50 mg by mouth daily. Take with or immediately following a meal.   Yes [provider]  rivaroxaban (XARELTO) 20 MG TABS tablet Take 1 tablet (20 mg total) by mouth daily with supper. 09/26/22  Yes Arrien, Jimmy Picket, MD  rosuvastatin (CRESTOR) 10 MG tablet Take 1 tablet (10 mg total) by mouth daily. 09/26/22 11/16/22 Yes Arrien, Jimmy Picket, MD  sacubitril-valsartan (ENTRESTO) 97-103 MG Take 1 tablet by mouth 2 (two) times  daily. 10/10/22  Yes [place], Maricela Bo, FNP  spironolactone (ALDACTONE) 25 MG tablet Take 1/2 tablet (12.5 mg total) by mouth daily. 09/27/22 11/16/22 Yes Arrien, Jimmy Picket, MD      Allergies    Patient has no known allergies.    Review of Systems   Review of Systems  Constitutional:  Negative for fever.  HENT:  Negative for sore throat.   Eyes:  Negative for redness.  Respiratory:  Positive for cough and shortness of breath.   Cardiovascular:  Positive for leg swelling. Negative for chest pain.  Gastrointestinal:  Negative for abdominal pain and vomiting.  Genitourinary:  Negative for flank pain.  Musculoskeletal:  Negative for back pain and neck pain.  Skin:  Negative for rash.  Neurological:  Negative for headaches.  Hematological:  Does not bruise/bleed easily.  Psychiatric/Behavioral:  Negative for confusion.     Physical Exam Updated Vital Signs BP (!) 133/100   Pulse 98   Temp 97.9 F (36.6 C) (Oral)   Resp (!) 24   SpO2 93%  Physical Exam Vitals and nursing note reviewed.  Constitutional:      Appearance: Normal appearance. He is well-developed.  HENT:     Head: Atraumatic.     Nose: Nose normal.     Mouth/Throat:     Mouth: Mucous membranes are moist.     Pharynx: Oropharynx is clear.  Eyes:     General: No scleral icterus.    Conjunctiva/sclera:  Conjunctivae normal.  Neck:     Trachea: No tracheal deviation.  Cardiovascular:     Rate and Rhythm: Tachycardia present. Rhythm irregular.     Pulses: Normal pulses.     Heart sounds: Normal heart sounds. No murmur heard.    No friction rub. No gallop.  Pulmonary:     Effort: Pulmonary effort is normal. No accessory muscle usage or respiratory distress.     Breath sounds: Rales present.     Comments: Rales bases Abdominal:     General: Bowel sounds are normal. There is no distension.     Palpations: Abdomen is soft.     Tenderness: There is no abdominal tenderness.  Genitourinary:    Comments: No cva  tenderness. Musculoskeletal:     Cervical back: Normal range of motion and neck supple. No rigidity.     Right lower leg: Edema present.     Left lower leg: Edema present.     Comments: Symmetric bilateral lower leg and ankle edema.   Skin:    General: Skin is warm and dry.     Findings: No rash.  Neurological:     Mental Status: He is alert.     Comments: Alert, speech clear.   Psychiatric:        Mood and Affect: Mood normal.     ED Results / Procedures / Treatments   Labs (all labs ordered are listed, but only abnormal results are displayed) Results for orders placed or performed during the hospital encounter of 11/16/22  Resp panel by RT-PCR (RSV, Flu A&B, Covid) Anterior Nasal Swab   Specimen: Anterior Nasal Swab  Result Value Ref Range   SARS Coronavirus 2 by RT PCR NEGATIVE NEGATIVE   Influenza A by PCR NEGATIVE NEGATIVE   Influenza B by PCR NEGATIVE NEGATIVE   Resp Syncytial Virus by PCR NEGATIVE NEGATIVE  Comprehensive metabolic panel  Result Value Ref Range   Sodium 134 (L) 135 - 145 mmol/L   Potassium 3.6 3.5 - 5.1 mmol/L   Chloride 101 98 - 111 mmol/L   CO2 22 22 - 32 mmol/L   Glucose, Bld 264 (H) 70 - 99 mg/dL   BUN 16 6 - 20 mg/dL   Creatinine, Ser 1.41 (H) 0.61 - 1.24 mg/dL   Calcium 8.9 8.9 - 10.3 mg/dL   Total Protein 6.8 6.5 - 8.1 g/dL   Albumin 2.9 (L) 3.5 - 5.0 g/dL   AST 23 15 - 41 U/L   ALT 15 0 - 44 U/L   Alkaline Phosphatase 93 38 - 126 U/L   Total Bilirubin 1.3 (H) 0.3 - 1.2 mg/dL   GFR, Estimated >60 >60 mL/min   Anion gap 11 5 - 15  Brain natriuretic peptide  Result Value Ref Range   B Natriuretic Peptide 765.5 (H) 0.0 - 100.0 pg/mL  CBC  Result Value Ref Range   WBC 8.2 4.0 - 10.5 K/uL   RBC 6.03 (H) 4.22 - 5.81 MIL/uL   Hemoglobin 16.5 13.0 - 17.0 g/dL   HCT 51.1 39.0 - 52.0 %   MCV 84.7 80.0 - 100.0 fL   MCH 27.4 26.0 - 34.0 pg   MCHC 32.3 30.0 - 36.0 g/dL   RDW 14.1 11.5 - 15.5 %   Platelets 296 150 - 400 K/uL   nRBC 0.0 0.0 -  0.2 %  Troponin I (High Sensitivity)  Result Value Ref Range   Troponin I (High Sensitivity) 44 (H) <18 ng/L     EKG  EKG Interpretation  Date/Time:  Thursday November 16 2022 11:20:46 EST Ventricular Rate:  98 PR Interval:  171 QRS Duration: 112 QT Interval:  385 QTC Calculation: 492 R Axis:   148 Text Interpretation: Sinus tachycardia Atrial premature complex Consider left ventricular hypertrophy Borderline prolonged QT interval Non-specific ST-t changes Confirmed by Lajean Saver [phone]) on 11/16/2022 2:42:12 PM  Radiology DG Chest Port 1 View  Result Date: 11/16/2022 CLINICAL DATA:  Shortness of breath EXAM: PORTABLE CHEST 1 VIEW COMPARISON:  09/21/2022 FINDINGS: Marked cardiomegaly. Pulmonary vascular congestion with diffuse bilateral interstitial opacities. No large pleural fluid collection, although trace bilateral pleural effusions are not excluded. No pneumothorax. IMPRESSION: Findings of CHF with pulmonary edema. Electronically Signed   By: Davina Poke D.O.   On: 11/16/2022 09:43    Procedures Procedures    Medications Ordered in ED Medications  nitroGLYCERIN (NITROSTAT) SL tablet 0.4 mg (has no administration in time range)  furosemide (LASIX) tablet 100 mg (has no administration in time range)  furosemide (LASIX) tablet 80 mg (80 mg Oral Given 11/16/22 1015)  metoprolol succinate (TOPROL-XL) 24 hr tablet 50 mg (50 mg Oral Given 11/16/22 1016)  sacubitril-valsartan (ENTRESTO) 97-103 mg per tablet (1 tablet Oral Given 11/16/22 1016)  rivaroxaban (XARELTO) tablet 20 mg (20 mg Oral Given 11/16/22 1016)  spironolactone (ALDACTONE) tablet 12.5 mg (12.5 mg Oral Given 11/16/22 1017)  hydrALAZINE (APRESOLINE) injection 10 mg (10 mg Intravenous Given 11/16/22 1119)  nitroGLYCERIN (NITROGLYN) 2 % ointment 1 inch (1 inch Topical Given 11/16/22 1125)    ED Course/ Medical Decision Making/ A&P                             Medical Decision Making Problems Addressed: Acute on chronic  combined systolic and diastolic CHF (congestive heart failure) ([place]): acute illness or injury with systemic symptoms that poses a threat to life or bodily functions Non compliance w medication regimen: acute illness or injury that poses a threat to life or bodily functions Uncontrolled hypertension: acute illness or injury with systemic symptoms that poses a threat to life or bodily functions  Amount and/or Complexity of Data Reviewed Independent Historian: EMS External Data Reviewed: notes. Labs: ordered. Radiology: ordered. ECG/medicine tests: ordered.  Risk Prescription drug management. Decision regarding hospitalization.   Iv ns. Continuous pulse ox and cardiac monitoring. Labs ordered/sent. Imaging ordered.   Differential diagnosis includes CHF exacerbation, covid/flu, pneumonia, etc . Dispo decision including potential need for admission considered - will get labs and imaging and reassess.   Reviewed nursing notes and prior charts for additional history. External reports reviewed.   Cardiac monitor: afib, rate 120. Bp is high. Ntg sl. Given additional med/lasix.   Labs reviewed/interpreted by me - bnp elev.   Xrays reviewed/interpreted by me - chf/edema.   Lasix iv. Hydralazine iv.   Pt also given dose of his home meds that he missed/didn't take today.   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  Performed by: Mirna Mires Total critical care time: 110 minutes Critical care time was exclusive of separately billable procedures and treating other patients. Critical care was necessary to treat or prevent imminent or life-threatening deterioration. Critical care was time spent personally by me on the following activities: development of treatment plan with patient and/or surrogate as well as nursing, discussions with consultants, evaluation of patient's response to treatment, examination  of patient,  obtaining history from patient or surrogate, ordering and performing treatments and interventions, ordering and review of laboratory studies, ordering and review of radiographic studies, pulse oximetry and re-evaluation of patient's condition.  NTG added.   Discussed w roc - will admit.   Recheck, resp status stable from prior, on 3 liters o2 [place].           Final Clinical Impression(s) / ED Diagnoses Final diagnoses:  Acute on chronic combined systolic and diastolic CHF (congestive heart failure) ([place])  Uncontrolled hypertension  Non compliance w medication regimen    Rx / DC Orders ED Discharge Orders     None           Lajean Saver, MD 11/16/22 1442

## 2022-11-16 NOTE — ED Notes (Signed)
Report called  to Veneta Penton, RN

## 2022-11-17 ENCOUNTER — Inpatient Hospital Stay (HOSPITAL_COMMUNITY): Payer: Self-pay

## 2022-11-17 ENCOUNTER — Other Ambulatory Visit (HOSPITAL_COMMUNITY): Payer: Self-pay

## 2022-11-17 DIAGNOSIS — I10 Essential (primary) hypertension: Secondary | ICD-10-CM

## 2022-11-17 DIAGNOSIS — I5041 Acute combined systolic (congestive) and diastolic (congestive) heart failure: Secondary | ICD-10-CM

## 2022-11-17 DIAGNOSIS — Z7984 Long term (current) use of oral hypoglycemic drugs: Secondary | ICD-10-CM

## 2022-11-17 DIAGNOSIS — E119 Type 2 diabetes mellitus without complications: Secondary | ICD-10-CM

## 2022-11-17 DIAGNOSIS — E1122 Type 2 diabetes mellitus with diabetic chronic kidney disease: Secondary | ICD-10-CM

## 2022-11-17 DIAGNOSIS — I130 Hypertensive heart and chronic kidney disease with heart failure and stage 1 through stage 4 chronic kidney disease, or unspecified chronic kidney disease: Principal | ICD-10-CM

## 2022-11-17 LAB — ECHOCARDIOGRAM COMPLETE
AR max vel: 2.71 cm2
AV Area VTI: 2.96 cm2
AV Area mean vel: 2.67 cm2
AV Mean grad: 2 mmHg
AV Peak grad: 4.4 mmHg
Ao pk vel: 1.05 m/s
Area-P 1/2: 7.99 cm2
Calc EF: 18.1 %
Height: 66 in
S' Lateral: 5 cm
Single Plane A2C EF: 17.5 %
Single Plane A4C EF: 17.2 %
Weight: 4109.37 oz

## 2022-11-17 LAB — BASIC METABOLIC PANEL
Anion gap: 9 (ref 5–15)
BUN: 14 mg/dL (ref 6–20)
CO2: 31 mmol/L (ref 22–32)
Calcium: 9.2 mg/dL (ref 8.9–10.3)
Chloride: 99 mmol/L (ref 98–111)
Creatinine, Ser: 1.35 mg/dL — ABNORMAL HIGH (ref 0.61–1.24)
GFR, Estimated: >60 mL/min (ref >60)
Glucose, Bld: 168 mg/dL — ABNORMAL HIGH (ref 70–99)
Potassium: 3.2 mmol/L — ABNORMAL LOW (ref 3.5–5.1)
Sodium: 139 mmol/L (ref 135–145)

## 2022-11-17 LAB — RENAL FUNCTION PANEL
Albumin: 2.9 g/dL — ABNORMAL LOW (ref 3.5–5.0)
Anion gap: 6 (ref 5–15)
BUN: 16 mg/dL (ref 6–20)
CO2: 35 mmol/L — ABNORMAL HIGH (ref 22–32)
Calcium: 9.5 mg/dL (ref 8.9–10.3)
Chloride: 97 mmol/L — ABNORMAL LOW (ref 98–111)
Creatinine, Ser: 1.42 mg/dL — ABNORMAL HIGH (ref 0.61–1.24)
GFR, Estimated: >60 mL/min (ref >60)
Glucose, Bld: 161 mg/dL — ABNORMAL HIGH (ref 70–99)
Phosphorus: 4.7 mg/dL — ABNORMAL HIGH (ref 2.5–4.6)
Potassium: 3.9 mmol/L (ref 3.5–5.1)
Sodium: 138 mmol/L (ref 135–145)

## 2022-11-17 LAB — GLUCOSE, CAPILLARY
Glucose-Capillary: 214 mg/dL — ABNORMAL HIGH (ref 70–99)
Glucose-Capillary: 194 mg/dL — ABNORMAL HIGH (ref 70–99)
Glucose-Capillary: 160 mg/dL — ABNORMAL HIGH (ref 70–99)
Glucose-Capillary: 132 mg/dL — ABNORMAL HIGH (ref 70–99)

## 2022-11-17 LAB — MAGNESIUM: Magnesium: 1.9 mg/dL (ref 1.7–2.4)

## 2022-11-17 MED ORDER — METOPROLOL SUCCINATE ER 50 MG PO TB24
50.0000 mg | ORAL_TABLET | Freq: Every day | ORAL | Status: DC
  Administered 2022-11-17 – 2022-11-19 (×3): 50 mg via ORAL
  Filled 2022-11-17 (×3): qty 1

## 2022-11-17 MED ORDER — ENTRESTO 97-103 MG PO TABS
1.0000 | ORAL_TABLET | Freq: Two times a day (BID) | ORAL | 0 refills | Status: DC
  Filled 2022-11-17: qty 60, 30d supply, fill #0

## 2022-11-17 MED ORDER — METFORMIN HCL 1000 MG PO TABS
1000.0000 mg | ORAL_TABLET | Freq: Two times a day (BID) | ORAL | 0 refills | Status: DC
  Filled 2022-11-17: qty 60, 30d supply, fill #0

## 2022-11-17 MED ORDER — ACETAMINOPHEN 325 MG PO TABS
650.0000 mg | ORAL_TABLET | Freq: Four times a day (QID) | ORAL | Status: DC | PRN
  Administered 2022-11-17: 650 mg via ORAL
  Filled 2022-11-17: qty 2

## 2022-11-17 MED ORDER — POTASSIUM CHLORIDE CRYS ER 20 MEQ PO TBCR
40.0000 meq | EXTENDED_RELEASE_TABLET | Freq: Two times a day (BID) | ORAL | Status: AC
  Administered 2022-11-17 (×2): 40 meq via ORAL
  Filled 2022-11-17 (×2): qty 2

## 2022-11-17 MED ORDER — EMPAGLIFLOZIN 10 MG PO TABS
10.0000 mg | ORAL_TABLET | Freq: Every day | ORAL | 0 refills | Status: DC
  Filled 2022-11-17: qty 30, 30d supply, fill #0

## 2022-11-17 MED ORDER — ROSUVASTATIN CALCIUM 10 MG PO TABS
10.0000 mg | ORAL_TABLET | Freq: Every day | ORAL | 0 refills | Status: DC
  Filled 2022-11-17: qty 30, 30d supply, fill #0

## 2022-11-17 MED ORDER — FUROSEMIDE 40 MG PO TABS
60.0000 mg | ORAL_TABLET | Freq: Every day | ORAL | 0 refills | Status: DC
  Filled 2022-11-17: qty 45, 30d supply, fill #0

## 2022-11-17 MED ORDER — METOPROLOL SUCCINATE ER 50 MG PO TB24: 50.0000 mg | ORAL_TABLET | Freq: Every day | ORAL | Status: DC

## 2022-11-17 MED ORDER — PERFLUTREN LIPID MICROSPHERE
1.0000 mL | INTRAVENOUS | Status: AC | PRN
  Administered 2022-11-17: 6 mL via INTRAVENOUS

## 2022-11-17 MED ORDER — MAGNESIUM SULFATE 2 GM/50ML IV SOLN
2.0000 g | Freq: Once | INTRAVENOUS | Status: AC
  Administered 2022-11-17: 2 g via INTRAVENOUS
  Filled 2022-11-17: qty 50

## 2022-11-17 MED ORDER — SPIRONOLACTONE 25 MG PO TABS
12.5000 mg | ORAL_TABLET | Freq: Every day | ORAL | 0 refills | Status: DC
  Filled 2022-11-17: qty 15, 30d supply, fill #0

## 2022-11-17 MED ORDER — IPRATROPIUM-ALBUTEROL 0.5-2.5 (3) MG/3ML IN SOLN
3.0000 mL | Freq: Once | RESPIRATORY_TRACT | Status: AC
  Administered 2022-11-17: 3 mL via RESPIRATORY_TRACT

## 2022-11-17 MED ORDER — FUROSEMIDE 10 MG/ML IJ SOLN
60.0000 mg | Freq: Once | INTRAMUSCULAR | Status: AC
  Administered 2022-11-17: 60 mg via INTRAVENOUS
  Filled 2022-11-17: qty 6

## 2022-11-17 MED ORDER — FUROSEMIDE 10 MG/ML IJ SOLN: 40.0000 mg | Freq: Once | INTRAMUSCULAR | Status: DC

## 2022-11-17 MED ORDER — RIVAROXABAN 20 MG PO TABS
20.0000 mg | ORAL_TABLET | Freq: Every day | ORAL | 0 refills | Status: DC
  Filled 2022-11-17: qty 30, 30d supply, fill #0

## 2022-11-17 MED ORDER — METOPROLOL SUCCINATE ER 50 MG PO TB24
50.0000 mg | ORAL_TABLET | Freq: Every day | ORAL | 0 refills | Status: DC
  Filled 2022-11-17: qty 30, 30d supply, fill #0

## 2022-11-17 MED ORDER — POTASSIUM CHLORIDE CRYS ER 20 MEQ PO TBCR
20.0000 meq | EXTENDED_RELEASE_TABLET | Freq: Once | ORAL | Status: AC
  Administered 2022-11-17: 20 meq via ORAL
  Filled 2022-11-17: qty 1

## 2022-11-17 NOTE — Progress Notes (Signed)
Patient complaining of leg cramps.Dr. Sherrye Payor notified . New orders received and carried out.

## 2022-11-17 NOTE — TOC Transition Note (Signed)
Discharge medications (8) are being stored in the main pharmacy on the ground floor until patient is ready for discharge.   

## 2022-11-17 NOTE — Progress Notes (Signed)
Echocardiogram 2D Echocardiogram has been performed.  Ronny Flurry 11/17/2022, 2:02 PM

## 2022-11-17 NOTE — Progress Notes (Signed)
Plan of care communicated to the patient using an Conservation officer, historic buildings  with [phone]. Patient verbalized understanding.

## 2022-11-17 NOTE — Progress Notes (Signed)
Subjective:   Summary: Justin [name] is a 42 y.o. year old male currently admitted on the IMTS HD#1 for CHF exacerbation.  Overnight Events: lactic acid 2>1.5, had cramps, repleted K and placed SSI. Slightly hypertensive, on 3L.   Used video spanish interpretor during encounter. His breathing is improved and he no longer has any chest pain. His swelling is also improved. No recent cough, cold, or other infectious symptoms. Has not drank alcohol recently. No belly pain, does not feel like it is distended any more. The swelling in his legs has also improved.  Overall, he feels much better.  Objective:  Vital signs in last 24 hours: Vitals:   11/16/22 1758 11/16/22 2018 11/17/22 0021 11/17/22 0603  BP: (!) 128/90 (!) 146/98 (!) 159/111 (!) 124/91  Pulse: 81 96 100   Resp: '20 20  20  '$ Temp: 98.1 F (36.7 C) 98.1 F (36.7 C) (!) 97.5 F (36.4 C) (!) 97.5 F (36.4 C)  TempSrc: Oral Oral Oral Oral  SpO2: 99% 92% 93%   Weight: 120.1 kg   116.5 kg  Height: '5\' 6"'$  (1.676 m)      Supplemental O2: Nasal Cannula Weaned to 2 L nasal cannula while in the room.   Physical Exam:  Constitutional: NAD Cardiovascular: RRR, no murmurs, rubs or gallops.  JVD slightly above the clavicle probably about 5 to 6 cm Pulmonary/Chest: normal work of breathing on room air,   Minimal expiratory wheezing Abdominal: soft, non-tender, non-distended Skin: warm and dry Extremities: upper/lower extremity pulses 2+, minimal lower extremity edema present.  Warm and well-perfused.  Filed Weights   11/16/22 1612 11/16/22 1758 11/17/22 0603  Weight: 126.9 kg 120.1 kg 116.5 kg     Intake/Output Summary (Last 24 hours) at 11/17/2022 0755 Last data filed at 11/17/2022 0430 Gross per 24 hour  Intake 850 ml  Output 5900 ml  Net -5050 ml   Net IO Since Admission: -5,050 mL [11/17/22 0755]  Pertinent Labs:    Latest Ref Rng & Units 11/16/2022    9:33 AM 10/10/2022    9:44 AM 09/22/2022     5:30 AM  CBC  WBC 4.0 - 10.5 K/uL 8.2  4.0  8.6   Hemoglobin 13.0 - 17.0 g/dL 16.5  16.5  15.4   Hematocrit 39.0 - 52.0 % 51.1  52.2  49.4   Platelets 150 - 400 K/uL 296  249  337        Latest Ref Rng & Units 11/17/2022   12:47 AM 11/16/2022    9:33 AM 10/10/2022    9:44 AM  CMP  Glucose 70 - 99 mg/dL 168  264  270   BUN 6 - 20 mg/dL '14  16  21   '$ Creatinine 0.61 - 1.24 mg/dL 1.35  1.41  1.51   Sodium 135 - 145 mmol/L 139  134  136   Potassium 3.5 - 5.1 mmol/L 3.2  3.6  3.4   Chloride 98 - 111 mmol/L 99  101  101   CO2 22 - 32 mmol/L '31  22  24   '$ Calcium 8.9 - 10.3 mg/dL 9.2  8.9  9.1   Total Protein 6.5 - 8.1 g/dL  6.8    Total Bilirubin 0.3 - 1.2 mg/dL  1.3    Alkaline Phos 38 - 126 U/L  93    AST 15 - 41 U/L  23  ALT 0 - 44 U/L  15      Imaging: DG Chest Port 1 View  Result Date: 11/16/2022 CLINICAL DATA:  Shortness of breath EXAM: PORTABLE CHEST 1 VIEW COMPARISON:  09/21/2022 FINDINGS: Marked cardiomegaly. Pulmonary vascular congestion with diffuse bilateral interstitial opacities. No large pleural fluid collection, although trace bilateral pleural effusions are not excluded. No pneumothorax. IMPRESSION: Findings of CHF with pulmonary edema. Electronically Signed   By: Davina Poke D.O.   On: 11/16/2022 09:43     EKG: Stable from prior as per H&P.  Assessment/Plan:   Principal Problem:   Acute exacerbation of CHF (congestive heart failure) ([place])   Patient Summary: Justin [name] is a 42 y.o. person living with a history of combined HF, GERD, T2DM, Atrial fibrillation, CKD II, HTN, Alcohol and cocaine use, and HLD who presented with shortness of breath and admitted for acute hypoxic respiratory failure and a CHF exacerbation   Acute on chronic heart failure exacerbation Acute hypoxic respiratory failure Received 180 mg of p.o. Lasix total yesterday.  Net -5 L diuresis overnight.  Weight today is about 116.5 kg.  Dry weight 114 kg.  He is feeling  significantly better today and is minimally volume overloaded on exam.  He remains stable on 2 L nasal cannula, hopefully can continue weaning, but also his hypoxia might be confounded by other factors as he has minimal expiratory wheezing no diagnosis of lung disease from my knowledge.  Electrolytes were repleted and repeat check shows potassium WNL, slight bump in creatinine, and contraction alkalosis.  Extremities are warm on exam.  Echo today shows EF 20 to 25%, severely decreased left ventricular function, global hypokinesis, grade 1 diastolic dysfunction, mildly reduced right ventricular function and enlargement, and moderate left atrial dilation and mild right atrial.  Right atrial pressure is normalized.  I think he is nearing euvolemia and can likely be discharged tomorrow.  Will send meds [facility] today in anticipation of.  I think his heart failure exacerbation is very likely due to running out of his home medicines.  He should follow-up with the advanced heart failure team in the outpatient setting and we can establish with him as PCP. -One-time nebulizer treatment.  Outpatient pulmonary workup as needed. -IV lasix 60 mg today -Can restart home metoprolol succinate 50 mg daily.  He is compensated. -restart entresto 200 BID, jardiance 10 mg daily, and spiro 12.5 mg daily -daily weights (last dry weight 114kg, admission weight 120kg) -strict I/o -daily electrolytes -wean oxygen   Paroxysmal atrial fibrillation Hx of PAF, Sinus rhythm today.  -continue home xarelto -restart home metoprolol tomorrow, rate controlled today.    Essential hypertension Normotensive to slightly hypertensive.  Improved on his home GDMT. -continue home entresto, spironolactone   CKD II Baseline Cr of 1.4-1.7.  Slight bump today with diuresis but still within his baseline range.  GFR remains greater than 60. -continue to montior   Type 2 DM On metformin at home. A1c one month ago at 7.8%.  -SSI -continue  home farxiga -follow up A1c in two months with pcp   HLD -continue home statin   Polysubstance use disorder History of significant alcohol and cocaine use, has not used in the last 6 months. Congratulated him on this.   Diet: Heart Healthy IVF: None VTE:  Xarelto Code: Full PT/OT recs: None TOC recs: Appreciate assistance with match Family Update:   Dispo: Anticipated discharge to Home in 1 days pending further volume management.   Witten Certain [place],  MD PGY-1 Internal Medicine Resident Please contact the on call pager after 5 pm and on weekends at [phone].

## 2022-11-17 NOTE — Hospital Course (Signed)
Used video spanish interpretor during encounter. His breathing is improved and he no longer has any chest pain. His swelling is also improved. No recent infections. Has not drank alcohol recently. No belly pain, does not feel like it is distended any more. The swelling in his legs has also improved.

## 2022-11-17 NOTE — TOC Progression Note (Addendum)
Transition of Care [facility]) - Progression Note    Patient Details  Name: Justin [name] MRN: [id number] Date of Birth: 16-Feb-1981  Transition of Care [facility] CM/SW Contact  Zenon Mayo, RN Phone Number: 11/17/2022, 1:24 PM  Clinical Narrative:    From home, presents with acute CHF Ex, plan for dc tomorrow per MD, TOC to fill meds today on Friday for patient to have when go home tomorrow.  Hospital follow up with [facility]  NCM assisted with Match Letter for meds.          Expected Discharge Plan and Services                                               Social Determinants of Health (SDOH) Interventions SDOH Screenings   Food Insecurity: No Food Insecurity (11/16/2022)  Housing: Low Risk  (11/16/2022)  Transportation Needs: Unmet Transportation Needs (11/16/2022)  Utilities: Not At Risk (11/16/2022)  Alcohol Screen: Medium Risk (02/20/2022)  Depression (PHQ2-9): Low Risk  (10/12/2022)  Financial Resource Strain: Medium Risk (06/05/2022)  Tobacco Use: Low Risk  (11/16/2022)    Readmission Risk Interventions     No data to display

## 2022-11-18 LAB — GLUCOSE, CAPILLARY
Glucose-Capillary: 156 mg/dL — ABNORMAL HIGH (ref 70–99)
Glucose-Capillary: 170 mg/dL — ABNORMAL HIGH (ref 70–99)
Glucose-Capillary: 128 mg/dL — ABNORMAL HIGH (ref 70–99)
Glucose-Capillary: 194 mg/dL — ABNORMAL HIGH (ref 70–99)

## 2022-11-18 LAB — RENAL FUNCTION PANEL
Albumin: 2.6 g/dL — ABNORMAL LOW (ref 3.5–5.0)
Anion gap: 6 (ref 5–15)
BUN: 17 mg/dL (ref 6–20)
CO2: 35 mmol/L — ABNORMAL HIGH (ref 22–32)
Calcium: 9.2 mg/dL (ref 8.9–10.3)
Chloride: 95 mmol/L — ABNORMAL LOW (ref 98–111)
Creatinine, Ser: 1.5 mg/dL — ABNORMAL HIGH (ref 0.61–1.24)
GFR, Estimated: 59 mL/min — ABNORMAL LOW (ref >60)
Glucose, Bld: 170 mg/dL — ABNORMAL HIGH (ref 70–99)
Phosphorus: 4.7 mg/dL — ABNORMAL HIGH (ref 2.5–4.6)
Potassium: 3.7 mmol/L (ref 3.5–5.1)
Sodium: 136 mmol/L (ref 135–145)

## 2022-11-18 MED ORDER — POTASSIUM CHLORIDE CRYS ER 20 MEQ PO TBCR
40.0000 meq | EXTENDED_RELEASE_TABLET | Freq: Once | ORAL | Status: AC
  Administered 2022-11-18: 40 meq via ORAL
  Filled 2022-11-18: qty 2

## 2022-11-18 MED ORDER — IPRATROPIUM-ALBUTEROL 0.5-2.5 (3) MG/3ML IN SOLN
3.0000 mL | RESPIRATORY_TRACT | Status: DC
  Administered 2022-11-18: 3 mL via RESPIRATORY_TRACT
  Filled 2022-11-18: qty 3

## 2022-11-18 MED ORDER — IPRATROPIUM-ALBUTEROL 0.5-2.5 (3) MG/3ML IN SOLN
3.0000 mL | Freq: Four times a day (QID) | RESPIRATORY_TRACT | Status: DC
  Administered 2022-11-18 – 2022-11-19 (×3): 3 mL via RESPIRATORY_TRACT
  Filled 2022-11-18 (×3): qty 3

## 2022-11-18 MED ORDER — FUROSEMIDE 40 MG PO TABS
60.0000 mg | ORAL_TABLET | Freq: Once | ORAL | Status: AC
  Administered 2022-11-18: 60 mg via ORAL
  Filled 2022-11-18: qty 1

## 2022-11-18 NOTE — Progress Notes (Signed)
Subjective:   Summary: Justin [name] is a 42 y.o. year old male currently admitted on the IMTS HD#2 for CHF exacerbation.  Overnight Events: NAEON.  HD stable.  Remains on 2 L.  Good diuresis overnight.   Used video spanish interpretor during encounter. He feels better. No trouble breathing. His swelling has improved. He is agreeable to walking around the hall.  We went through how to use an incentive spirometer.  He does desat intermittently without supplemental oxygen, but returns to close to 100% after deep breathing.  Objective:  Vital signs in last 24 hours: Vitals:   11/17/22 2156 11/18/22 0024 11/18/22 0455 11/18/22 0500  BP:  125/78 115/88   Pulse:  93    Resp:  18 18   Temp:  (!) 97.5 F (36.4 C) (!) 97.5 F (36.4 C)   TempSrc:  Oral Oral   SpO2: 96% (!) 88%    Weight:  115.5 kg  115.5 kg  Height:       Supplemental O2: Nasal Cannula 2 LNC   Physical Exam:  Constitutional: NAD Cardiovascular: RRR, no murmurs, rubs or gallops.  Minimal JVD Pulmonary/Chest: normal work of breathing on room air/2 LNC, no wheezing today, mild crackles in lower lung bases Abdominal: soft, non-tender, non-distended Skin: warm and dry Extremities: upper/lower extremity pulses 2+, minimal lower extremity edema present.  Warm and well-perfused.  Filed Weights   11/17/22 0603 11/18/22 0024 11/18/22 0500  Weight: 116.5 kg 115.5 kg 115.5 kg     Intake/Output Summary (Last 24 hours) at 11/18/2022 0711 Last data filed at 11/18/2022 0600 Gross per 24 hour  Intake 1400 ml  Output 2830 ml  Net -1430 ml    Net IO Since Admission: -6,480 mL [11/18/22 0711]  Pertinent Labs:    Latest Ref Rng & Units 11/16/2022    9:33 AM 10/10/2022    9:44 AM 09/22/2022    5:30 AM  CBC  WBC 4.0 - 10.5 K/uL 8.2  4.0  8.6   Hemoglobin 13.0 - 17.0 g/dL 16.5  16.5  15.4   Hematocrit 39.0 - 52.0 % 51.1  52.2  49.4   Platelets 150 - 400 K/uL 296  249  337        Latest Ref Rng &  Units 11/18/2022    1:06 AM 11/17/2022   12:05 PM 11/17/2022   12:47 AM  CMP  Glucose 70 - 99 mg/dL 170  161  168   BUN 6 - 20 mg/dL '17  16  14   '$ Creatinine 0.61 - 1.24 mg/dL 1.50  1.42  1.35   Sodium 135 - 145 mmol/L 136  138  139   Potassium 3.5 - 5.1 mmol/L 3.7  3.9  3.2   Chloride 98 - 111 mmol/L 95  97  99   CO2 22 - 32 mmol/L 35  35  31   Calcium 8.9 - 10.3 mg/dL 9.2  9.5  9.2     Imaging: ECHOCARDIOGRAM COMPLETE  Result Date: 11/17/2022    ECHOCARDIOGRAM REPORT   Patient Name:   Justin [name] Date of Exam: 11/17/2022 Medical Rec #:  [id number]              Height:       66.0 in Accession #:    [id number]             Weight:  256.8 lb Date of Birth:  09-Sep-1981              BSA:          2.224 m Patient Age:    41 years               BP:           124/91 mmHg Patient Gender: M                      HR:           100 bpm. Exam Location:  Inpatient Procedure: 2D Echo, Cardiac Doppler, Color Doppler and Intracardiac            Opacification Agent Indications:     [id number]  History:         Patient has prior history of Echocardiogram examinations, most                  recent 06/02/2022. CHF and Cardiomyopathy, Arrythmias:Atrial                  Fibrillation and Tachycardia, Signs/Symptoms:Dyspnea; Risk                  Factors:Hypertension and Diabetes. CKD, stage 2.  Sonographer:     Ronny Flurry Referring Phys:  [id number] [place] Diagnosing Phys: Franki Monte IMPRESSIONS  1. Left ventricular ejection fraction, by estimation, is 20 to 25%. The left ventricle has severely decreased function. The left ventricle demonstrates global hypokinesis. The left ventricular internal cavity size was mildly dilated. There is mild asymmetric left ventricular hypertrophy of the basal-septal segment. Left ventricular diastolic parameters are consistent with Grade I diastolic dysfunction (impaired relaxation). No LV thrombus.  2. Right ventricular systolic function is mildly reduced. The right  ventricular size is mildly enlarged. Tricuspid regurgitation signal is inadequate for assessing PA pressure.  3. Left atrial size was moderately dilated.  4. Right atrial size was mildly dilated.  5. The mitral valve is normal in structure. Mild mitral valve regurgitation. No evidence of mitral stenosis.  6. The aortic valve is tricuspid. Aortic valve regurgitation is not visualized. No aortic stenosis is present.  7. The inferior vena cava is normal in size with greater than 50% respiratory variability, suggesting right atrial pressure of 3 mmHg. FINDINGS  Left Ventricle: Left ventricular ejection fraction, by estimation, is 20 to 25%. The left ventricle has severely decreased function. The left ventricle demonstrates global hypokinesis. Definity contrast agent was given IV to delineate the left ventricular endocardial borders. The left ventricular internal cavity size was mildly dilated. There is mild asymmetric left ventricular hypertrophy of the basal-septal segment. Left ventricular diastolic parameters are consistent with Grade I diastolic dysfunction (impaired relaxation). Right Ventricle: The right ventricular size is mildly enlarged. No increase in right ventricular wall thickness. Right ventricular systolic function is mildly reduced. Tricuspid regurgitation signal is inadequate for assessing PA pressure. Left Atrium: Left atrial size was moderately dilated. Right Atrium: Right atrial size was mildly dilated. Pericardium: Trivial pericardial effusion is present. Mitral Valve: The mitral valve is normal in structure. Mild mitral annular calcification. Mild mitral valve regurgitation. No evidence of mitral valve stenosis. Tricuspid Valve: The tricuspid valve is normal in structure. Tricuspid valve regurgitation is not demonstrated. Aortic Valve: The aortic valve is tricuspid. Aortic valve regurgitation is not visualized. No aortic stenosis is present. Aortic valve mean gradient measures 2.0 mmHg. Aortic  valve peak gradient measures 4.4 mmHg.  Aortic valve area, by VTI measures 2.96 cm. Pulmonic Valve: The pulmonic valve was normal in structure. Pulmonic valve regurgitation is not visualized. Aorta: The aortic root is normal in size and structure. Venous: The inferior vena cava is normal in size with greater than 50% respiratory variability, suggesting right atrial pressure of 3 mmHg. IAS/Shunts: No atrial level shunt detected by color flow Doppler.  LEFT VENTRICLE PLAX 2D LVIDd:         6.10 cm      Diastology LVIDs:         5.00 cm      LV e' medial:    4.30 cm/s LV PW:         1.70 cm      LV E/e' medial:  12.7 LV IVS:        1.40 cm      LV e' lateral:   3.81 cm/s LVOT diam:     2.20 cm      LV E/e' lateral: 14.4 LV SV:         39 LV SV Index:   18 LVOT Area:     3.80 cm  LV Volumes (MOD) LV vol d, MOD A2C: 183.0 ml LV vol d, MOD A4C: 232.0 ml LV vol s, MOD A2C: 151.0 ml LV vol s, MOD A4C: 192.0 ml LV SV MOD A2C:     32.0 ml LV SV MOD A4C:     232.0 ml LV SV MOD BP:      38.8 ml RIGHT VENTRICLE             IVC RV S prime:     10.50 cm/s  IVC diam: 1.70 cm TAPSE (M-mode): 2.2 cm LEFT ATRIUM             Index        RIGHT ATRIUM           Index LA diam:        6.00 cm 2.70 cm/m   RA Area:     23.00 cm LA Vol (A2C):   89.0 ml 40.02 ml/m  RA Volume:   68.00 ml  30.57 ml/m LA Vol (A4C):   89.1 ml 40.06 ml/m LA Biplane Vol: 94.3 ml 42.40 ml/m  AORTIC VALVE AV Area (Vmax):    2.71 cm AV Area (Vmean):   2.67 cm AV Area (VTI):     2.96 cm AV Vmax:           105.00 cm/s AV Vmean:          68.500 cm/s AV VTI:            0.133 m AV Peak Grad:      4.4 mmHg AV Mean Grad:      2.0 mmHg LVOT Vmax:         74.80 cm/s LVOT Vmean:        48.040 cm/s LVOT VTI:          0.104 m LVOT/AV VTI ratio: 0.78  AORTA Ao Root diam: 3.30 cm Ao Asc diam:  3.30 cm MITRAL VALVE MV Area (PHT): 7.99 cm    SHUNTS MV Decel Time: 95 msec     Systemic VTI:  0.10 m MV E velocity: 54.80 cm/s  Systemic Diam: 2.20 cm MV A velocity: 56.90 cm/s  MV E/A ratio:  0.96 Dalton McleanMD Electronically signed by Franki Monte Signature Date/Time: 11/17/2022/2:35:49 PM    Final (Updated)      EKG: Stable from prior  as per H&P.  Assessment/Plan:   Principal Problem:   Acute exacerbation of CHF (congestive heart failure) ([place]) Active Problems:   Uncontrolled hypertension   Type 2 diabetes mellitus without complication, without long-term current use of insulin ([place])   Patient Summary: Justin [name] is a 42 y.o. person living with a history of combined HF, GERD, T2DM, Atrial fibrillation, CKD II, HTN, Alcohol and cocaine use, and HLD who presented with shortness of breath and admitted for acute hypoxic respiratory failure and a CHF exacerbation   Acute on chronic heart failure exacerbation Acute hypoxic respiratory failure Received 60 mg of IV Lasix total yesterday.  Net - 1.4 L diuresis overnight.  Weight today is about 115.5 kg.  Dry weight 114 kg.  He is feeling significantly better today and is minimally volume overloaded on exam. Labs show slight bump in creatinine, and contraction alkalosis.  Extremities are warm on exam.  Unclear at this point if his hypoxia is actually from volume overload.  He might have underlying lung disease and also is at risk for OSA and obesity hypoventilation.  When he has deep inspiration expiration, his oxygen rises to about 100% even on room air.  I gave him an incentive spirometer today and will reassess tomorrow.  Official ambulatory sats show that he needs 2 L nasal cannula with ambulation.  He should follow-up with the advanced heart failure team in the outpatient setting and we can establish with him as PCP.  His meds are ready for whenever he is discharged.  Since he is still possibly needing oxygen, will keep him here for today and likely discharge tomorrow - DuoNebs every 6 hours.  Incentive spirometry..  Outpatient pulmonary workup as needed. -P.o. lasix 60 mg today -Continue metoprolol  succinate 50 mg daily.  He is compensated. -Continue entresto 200 BID, jardiance 10 mg daily, and spiro 12.5 mg daily -daily weights (last dry weight 114kg) -strict I/o -daily electrolytes -wean oxygen   Paroxysmal atrial fibrillation Hx of PAF, Sinus rhythm today.  -continue home xarelto -restart home metoprolol tomorrow, rate controlled today.    Essential hypertension Normotensive.  Improved on his home GDMT. -continue home entresto, spironolactone   CKD II Baseline Cr of 1.4-1.7.  Slight bump today with diuresis but still within his baseline range.  GFR remains greater than 60. -continue to montior   Type 2 DM On metformin at home. A1c one month ago at 7.8%.  -SSI -continue home farxiga -follow up A1c in two months with pcp   HLD -continue home statin   Polysubstance use disorder History of significant alcohol and cocaine use, has not used in the last 6 months. Congratulated him on this.   Diet: Heart Healthy IVF: None VTE:  Xarelto Code: Full PT/OT recs: None TOC recs: Appreciate assistance with match Family Update:   Dispo: Anticipated discharge to Home in 1 days pending further volume management.   Linus Galas, MD PGY-1 Internal Medicine Resident Please contact the on call pager after 5 pm and on weekends at [phone].

## 2022-11-18 NOTE — Evaluation (Signed)
Physical Therapy Evaluation and Discharge Patient Details Name: Justin [name] MRN: [id number] DOB: Aug 28, 1981 Today's Date: 11/18/2022  History of Present Illness  Pt is a 42 y.o. M who presents 11/16/2022 for CHF exacerbation. Significant PMH: combined HF, T2DM, atrial fibrillation, CKD II, HTN, alcohol and cocaine use, HLD.  Clinical Impression  Patient evaluated by Physical Therapy with no further acute PT needs identified. Pt is overall mobilizing well with good activity tolerance throughout. Ambulating 500 ft with no assistive device and negotiated stairs without any physical assist or difficulty. Does require 2L O2 to maintain sats > 88%, HR 96. Education provided regarding HF signs/symptoms and exercise recommendations. All education has been completed and the patient has no further questions. PT is signing off. Thank you for this referral.  SATURATION QUALIFICATIONS: (This note is used to comply with regulatory documentation for home oxygen)  Patient Saturations on Room Air at Rest = 94%  Patient Saturations on Room Air while Ambulating = 87%  Patient Saturations on 2 Liters of oxygen while Ambulating = 95%  Please briefly explain why patient needs home oxygen:follow-up Physical Therapy or equipment needs: To maintain oxygen saturations > 88% when ambulating      Recommendations for follow up therapy are one component of a multi-disciplinary discharge planning process, led by the attending physician.  Recommendations may be updated based on patient status, additional functional criteria and insurance authorization.  Follow Up Recommendations No PT follow up      Assistance Recommended at Discharge None  Patient can return home with the following  Assist for transportation    Equipment Recommendations None recommended by PT  Recommendations for Other Services       Functional Status Assessment Patient has not had a recent decline in their functional status      Precautions / Restrictions Precautions Precautions: Other (comment) Precaution Comments: watch O2 Restrictions Weight Bearing Restrictions: No      Mobility  Bed Mobility Overal bed mobility: Independent                  Transfers Overall transfer level: Independent Equipment used: None                    Ambulation/Gait Ambulation/Gait assistance: Independent Gait Distance (Feet): 500 Feet Assistive device: None Gait Pattern/deviations: WFL(Within Functional Limits)       General Gait Details: Good pace and posture throughout  Stairs Stairs: Yes Stairs assistance: Independent Stair Management: No rails Number of Stairs: 4    Wheelchair Mobility    Modified Rankin (Stroke Patients Only)       Balance Overall balance assessment: No apparent balance deficits (not formally assessed)                                           Pertinent Vitals/Pain Pain Assessment Pain Assessment: No/denies pain    Home Living Family/patient expects to be discharged to:: Private residence Living Arrangements: Other relatives (sister) Available Help at Discharge: Family Type of Home: House Home Access: Stairs to enter   Technical brewer of Steps: 5   Home Layout: One level        Prior Function Prior Level of Function : Independent/Modified Independent             Mobility Comments: Works a Herbalist  Extremity/Trunk Assessment   Upper Extremity Assessment Upper Extremity Assessment: Overall WFL for tasks assessed    Lower Extremity Assessment Lower Extremity Assessment: Overall WFL for tasks assessed    Cervical / Trunk Assessment Cervical / Trunk Assessment: Normal  Communication   Communication: Interpreter utilized Justin [name] [phone])  Cognition Arousal/Alertness: Awake/alert Behavior During Therapy: WFL for tasks assessed/performed Overall Cognitive Status: Within Functional Limits  for tasks assessed                                          General Comments      Exercises     Assessment/Plan    PT Assessment Patient does not need any further PT services  PT Problem List         PT Treatment Interventions      PT Goals (Current goals can be found in the Care Plan section)  Acute Rehab PT Goals Patient Stated Goal: did not state PT Goal Formulation: All assessment and education complete, DC therapy    Frequency       Co-evaluation               AM-PAC PT "6 Clicks" Mobility  Outcome Measure Help needed turning from your back to your side while in a flat bed without using bedrails?: None Help needed moving from lying on your back to sitting on the side of a flat bed without using bedrails?: None Help needed moving to and from a bed to a chair (including a wheelchair)?: None Help needed standing up from a chair using your arms (e.g., wheelchair or bedside chair)?: None Help needed to walk in hospital room?: None Help needed climbing 3-5 steps with a railing? : None 6 Click Score: 24    End of Session   Activity Tolerance: Patient tolerated treatment well Patient left: in bed;with call bell/phone within reach   PT Visit Diagnosis: Difficulty in walking, not elsewhere classified (R26.2)    Time: [id number] PT Time Calculation (min) (ACUTE ONLY): 19 min   Charges:   PT Evaluation $PT Eval Low Complexity: 1 Low          Justin [name], PT, DPT Acute Rehabilitation Services Office [phone]   Justin [name] 11/18/2022, 12:01 PM

## 2022-11-18 NOTE — Progress Notes (Signed)
Nurse requested Mobility Specialist to perform oxygen saturation test with pt which includes removing pt from oxygen both at rest and while ambulating.  Below are the results from that testing.     Patient Saturations on Room Air at Rest = spO2 87%  Patient Saturations on Room Air while Ambulating = N/A. (Pt spO2 <90% on OL O2 at rest.)   Patient Saturations on 2 Liters of oxygen while Ambulating = sp02 93%. Rested and performed pursed lip breathing for 1 minute with sp02 at 94%.  At end of testing pt left in room on 2  Liters of oxygen.  Reported results to nurse.   [place] Specialist Please contact via SecureChat or Rehab office at [phone]

## 2022-11-19 DIAGNOSIS — I110 Hypertensive heart disease with heart failure: Secondary | ICD-10-CM

## 2022-11-19 LAB — GLUCOSE, CAPILLARY
Glucose-Capillary: 140 mg/dL — ABNORMAL HIGH (ref 70–99)
Glucose-Capillary: 152 mg/dL — ABNORMAL HIGH (ref 70–99)

## 2022-11-19 LAB — RENAL FUNCTION PANEL
Albumin: 2.5 g/dL — ABNORMAL LOW (ref 3.5–5.0)
Anion gap: 8 (ref 5–15)
BUN: 21 mg/dL — ABNORMAL HIGH (ref 6–20)
CO2: 31 mmol/L (ref 22–32)
Calcium: 8.9 mg/dL (ref 8.9–10.3)
Chloride: 96 mmol/L — ABNORMAL LOW (ref 98–111)
Creatinine, Ser: 1.43 mg/dL — ABNORMAL HIGH (ref 0.61–1.24)
GFR, Estimated: >60 mL/min (ref >60)
Glucose, Bld: 176 mg/dL — ABNORMAL HIGH (ref 70–99)
Phosphorus: 5.4 mg/dL — ABNORMAL HIGH (ref 2.5–4.6)
Potassium: 3.6 mmol/L (ref 3.5–5.1)
Sodium: 135 mmol/L (ref 135–145)

## 2022-11-19 NOTE — Plan of Care (Signed)
  Problem: Clinical Measurements: Goal: Will remain free from infection Outcome: Completed/Met   Problem: Health Behavior/Discharge Planning: Goal: Ability to manage health-related needs will improve Outcome: Completed/Met   Problem: Nutritional: Goal: Maintenance of adequate nutrition will improve Outcome: Completed/Met

## 2022-11-19 NOTE — Progress Notes (Signed)
Pt being d/c, VSS, IV removed, Education complete, TOC meds at bedside, PCP appt made for followup.   Alvis Lemmings, RN 11/19/2022 12:24 PM

## 2022-11-19 NOTE — Discharge Summary (Signed)
Name: Justin [name] MRN: [id number] DOB: 13-Jun-1981 42 y.o. PCP: Kerin Perna, NP  Date of Admission: 11/16/2022  8:58 AM Date of Discharge: 11/19/2022 Attending Physician: Dr. Angelia Mould  Discharge Diagnosis: Principal Problem:   Acute exacerbation of CHF (congestive heart failure) ([place]) Active Problems:   Uncontrolled hypertension   Type 2 diabetes mellitus without complication, without long-term current use of insulin ([place])    Discharge Medications: Allergies as of 11/19/2022   No Known Allergies      Medication List     TAKE these medications    Entresto 97-103 MG Generic drug: sacubitril-valsartan Take 1 tablet by mouth 2 (two) times daily.   furosemide 40 MG tablet Commonly known as: LASIX Take 1.5 tablets (60 mg total) by mouth daily.   Jardiance 10 MG Tabs tablet Generic drug: empagliflozin Tome 1 tableta (10 mg en total) por va oral diariamente. (Take 1 tablet (10 mg total) by mouth daily.)   metFORMIN 1000 MG tablet Commonly known as: GLUCOPHAGE Take 1 tablet (1,000 mg total) by mouth 2 (two) times daily with a meal.   metoprolol succinate 50 MG 24 hr tablet Commonly known as: TOPROL-XL Take 1 tablet (50 mg total) by mouth daily. Take with or immediately following a meal.   rosuvastatin 10 MG tablet Commonly known as: CRESTOR Tome 1 tableta (10 mg en total) por va oral diariamente. (Take 1 tablet (10 mg total) by mouth daily.)   spironolactone 25 MG tablet Commonly known as: ALDACTONE Take 1/2 tablet (12.5 mg total) by mouth daily.   Xarelto 20 MG Tabs tablet Generic drug: rivaroxaban Take 1 tablet (20 mg total) by mouth daily with supper.        Disposition and follow-up:   Justin [name] was discharged from [facility] in Good condition.  At the hospital follow up visit please address:  1.  Follow-up:  a.  Please reassess his volume status and make sure he has access to his GDMT.  He also  needs close follow-up with the advanced heart failure clinic.    b.  He could benefit from outpatient PFTs and sleep study  2.  Labs / imaging needed at time of follow-up: BMP  3.  Pending labs/ test needing follow-up: None  Follow-up Appointments:  Follow-up Information     Buford Dresser, MD. Schedule an appointment as soon as possible for a visit in 2 week(s).   Specialty: Cardiology Contact information: [place] [address] [place] Hospital Course by problem list: Justin [name] is a 42 y.o. person living with a history of combined HF, GERD, T2DM, Atrial fibrillation, CKD II, HTN, Alcohol and cocaine use, and HLD who presented with shortness of breath and admitted for acute hypoxic respiratory failure and a CHF exacerbation   Acute on chronic heart failure exacerbation Acute hypoxic respiratory failure Hx of combined systolic/diastolic heart failure, last EF of 15-20%. NYHA IV on admission today. He follows with advanced heart failure is on appropriate GDMT. Etiology of his HF thought to be due to long standing history of uncontrolled HTN and substance use, specifically cocaine. Has not had cardiac PET yet. He presents with acute hypoxia initially thought to be secondary to volume overload. He has been out of his GDMT and diuretic therapy for the past 5 days. He notes prior to this he felt fine. He has new orthopnea and  dyspnea at rest. On exam he was grossly volume overloaded and requiring 2L [place]. Chest xray consistent with hypervolemia, no focal opacity. BNP elevated at 765, last HF exacerbation in the 800-900's. Weight today of 120 kg, last discharge dry weight of 114 kg. Suspected HF exacerbation in setting of being out of his medications and severe symptomatic hypertension. No evidence of ischemic changes on EKG, troponin low and flat. Do not suspect low output state or need for inotropes; extremities are warm, he is  mentating well and with a normal lactate. No evidence of other organ dysfunction. Do not suspect infectious etiology, normal WBC and afebrile. Low suspicion for pulmonary embolism.  Received initial 180 mg of Lasix p.o. on first day of his hospitalization and had subsequent brisk net -5 L diuresis.  He never had reduced kidney function so his home [place], [place], and spironolactone were continued from the beginning of admission.  He did receive dose of metoprolol succinate 50 mg initially in the ED and then we continued this daily as he appeared compensated.  He quickly reached a euvolemic state on hospital day 2 after 1 more day of IV diuresis with Lasix 60.  Subsequent weight was about 115 kg.  Repeat echo this admission showed EF 20 to 25%, severely decreased left ventricular function, global hypokinesis, grade 1 diastolic dysfunction, mildly reduced right ventricular function and enlargement, and moderate left atrial dilation and mild right atrial dilation with normal right atrial pressure.  Of note, he did have some wheezing as well initially during the admission and benefited from [facility] treatment.  He seems to desat at night.  He is at high OSA risk.  He did have some recorded ambulatory sats on day one of the admission that showed an oxygen requirement, but he was recorded multiple times of rest and exertion satting 100% on room air when he takes deep breaths, we encouraged this with incentive spirometry.  He has never been symptomatic with ambulation after initial volume stabilization.  I suspect that his O2 sats have multiple confounding factors including obesity, possible underlying lung disease.  When we went to assess him on day of discharge, his nasal cannula had been off all night as it had slipped out of his nostrils and he felt totally fine.  He was satting close to 100% while talking to [place] on room air.  Given unclear clinical benefit, we decided not to discharge with oxygen at this time.  He  could benefit from outpatient follow-up with PFTs and possibly sleep study for further workup.  From a heart failure perspective, we discharged him with his home GDMT and he will follow-up with [place] in clinic and also with the advanced heart failure service.  Paroxysmal atrial fibrillation Hx of PAF, Sinus rhythm this admission.  Continued home Xarelto and metoprolol as above.   Essential hypertension On admission BP elevated at 188/123. Improved after restarting home medications and he was normotensive on day of discharge.   CKD II Baseline Cr of 1.4-1.7.  Slight creatinine bump and contraction alkalosis with initial diuresis but he never had AKI  Type 2 DM On metformin at home. A1c one month ago at 7.8%.  Continued home [place].  SSI while inpatient.  Resumed metformin at discharge.  HLD continued home statin   Polysubstance use disorder History of significant alcohol and cocaine use, has not used in the last 6 months. Congratulated him on this.   Subjective: He feels better. No trouble breathing. His swelling has improved. He  had no symptoms when ambulating yesterday. No chest pain. Ok to [employer] today with his GDMT and follow up outpatient. Answered all questions and interpreter used during this conversation.  Discharge Vitals:   BP (!) 125/98 (BP Location: Left Arm)   Pulse 93   Temp 97.9 F (36.6 C) (Oral)   Resp 20   Ht '5\' 6"'$  (1.676 m)   Wt 115.5 kg   SpO2 96%   BMI 41.10 kg/m  Discharge exam: Constitutional: NAD Cardiovascular: RRR, no murmurs, rubs or gallops.  no JVD Pulmonary/Chest: normal work of breathing on room air no wheezing or crackles Abdominal: soft, non-tender, non-distended Skin: warm and dry Extremities: upper/lower extremity pulses 2+, no lower extremity edema present.  Warm and well-perfused.   Pertinent Labs, Studies, and Procedures:     Latest Ref Rng & Units 11/16/2022    9:33 AM 10/10/2022    9:44 AM 09/22/2022    5:30 AM  CBC  WBC 4.0 - 10.5 K/uL  8.2  4.0  8.6   Hemoglobin 13.0 - 17.0 g/dL 16.5  16.5  15.4   Hematocrit 39.0 - 52.0 % 51.1  52.2  49.4   Platelets 150 - 400 K/uL 296  249  337        Latest Ref Rng & Units 11/19/2022   12:35 AM 11/18/2022    1:06 AM 11/17/2022   12:05 PM  CMP  Glucose 70 - 99 mg/dL 176  170  161   BUN 6 - 20 mg/dL '21  17  16   '$ Creatinine 0.61 - 1.24 mg/dL 1.43  1.50  1.42   Sodium 135 - 145 mmol/L 135  136  138   Potassium 3.5 - 5.1 mmol/L 3.6  3.7  3.9   Chloride 98 - 111 mmol/L 96  95  97   CO2 22 - 32 mmol/L 31  35  35   Calcium 8.9 - 10.3 mg/dL 8.9  9.2  9.5     ECHOCARDIOGRAM COMPLETE  Result Date: 11/17/2022    ECHOCARDIOGRAM REPORT   Patient Name:   Justin [name] Date of Exam: 11/17/2022 Medical Rec #:  [id number]              Height:       66.0 in Accession #:    [id number]             Weight:       256.8 lb Date of Birth:  [birth year]              BSA:          2.224 m Patient Age:    57 years               BP:           124/91 mmHg Patient Gender: M                      HR:           100 bpm. Exam Location:  Inpatient Procedure: 2D Echo, Cardiac Doppler, Color Doppler and Intracardiac            Opacification Agent Indications:     [id number]  History:         Patient has prior history of Echocardiogram examinations, most                  recent 06/02/2022. CHF and Cardiomyopathy, Arrythmias:Atrial  Fibrillation and Tachycardia, Signs/Symptoms:Dyspnea; Risk                  Factors:Hypertension and Diabetes. CKD, stage 2.  Sonographer:     Ronny Flurry Referring Phys:  [id number] [place] Diagnosing Phys: Franki Monte IMPRESSIONS  1. Left ventricular ejection fraction, by estimation, is 20 to 25%. The left ventricle has severely decreased function. The left ventricle demonstrates global hypokinesis. The left ventricular internal cavity size was mildly dilated. There is mild asymmetric left ventricular hypertrophy of the basal-septal segment. Left ventricular diastolic  parameters are consistent with Grade I diastolic dysfunction (impaired relaxation). No LV thrombus.  2. Right ventricular systolic function is mildly reduced. The right ventricular size is mildly enlarged. Tricuspid regurgitation signal is inadequate for assessing PA pressure.  3. Left atrial size was moderately dilated.  4. Right atrial size was mildly dilated.  5. The mitral valve is normal in structure. Mild mitral valve regurgitation. No evidence of mitral stenosis.  6. The aortic valve is tricuspid. Aortic valve regurgitation is not visualized. No aortic stenosis is present.  7. The inferior vena cava is normal in size with greater than 50% respiratory variability, suggesting right atrial pressure of 3 mmHg. FINDINGS  Left Ventricle: Left ventricular ejection fraction, by estimation, is 20 to 25%. The left ventricle has severely decreased function. The left ventricle demonstrates global hypokinesis. Definity contrast agent was given IV to delineate the left ventricular endocardial borders. The left ventricular internal cavity size was mildly dilated. There is mild asymmetric left ventricular hypertrophy of the basal-septal segment. Left ventricular diastolic parameters are consistent with Grade I diastolic dysfunction (impaired relaxation). Right Ventricle: The right ventricular size is mildly enlarged. No increase in right ventricular wall thickness. Right ventricular systolic function is mildly reduced. Tricuspid regurgitation signal is inadequate for assessing PA pressure. Left Atrium: Left atrial size was moderately dilated. Right Atrium: Right atrial size was mildly dilated. Pericardium: Trivial pericardial effusion is present. Mitral Valve: The mitral valve is normal in structure. Mild mitral annular calcification. Mild mitral valve regurgitation. No evidence of mitral valve stenosis. Tricuspid Valve: The tricuspid valve is normal in structure. Tricuspid valve regurgitation is not demonstrated. Aortic  Valve: The aortic valve is tricuspid. Aortic valve regurgitation is not visualized. No aortic stenosis is present. Aortic valve mean gradient measures 2.0 mmHg. Aortic valve peak gradient measures 4.4 mmHg. Aortic valve area, by VTI measures 2.96 cm. Pulmonic Valve: The pulmonic valve was normal in structure. Pulmonic valve regurgitation is not visualized. Aorta: The aortic root is normal in size and structure. Venous: The inferior vena cava is normal in size with greater than 50% respiratory variability, suggesting right atrial pressure of 3 mmHg. IAS/Shunts: No atrial level shunt detected by color flow Doppler.  LEFT VENTRICLE PLAX 2D LVIDd:         6.10 cm      Diastology LVIDs:         5.00 cm      LV e' medial:    4.30 cm/s LV PW:         1.70 cm      LV E/e' medial:  12.7 LV IVS:        1.40 cm      LV e' lateral:   3.81 cm/s LVOT diam:     2.20 cm      LV E/e' lateral: 14.4 LV SV:         39 LV SV Index:   18 LVOT Area:  3.80 cm  LV Volumes (MOD) LV vol d, MOD A2C: 183.0 ml LV vol d, MOD A4C: 232.0 ml LV vol s, MOD A2C: 151.0 ml LV vol s, MOD A4C: 192.0 ml LV SV MOD A2C:     32.0 ml LV SV MOD A4C:     232.0 ml LV SV MOD BP:      38.8 ml RIGHT VENTRICLE             IVC RV S prime:     10.50 cm/s  IVC diam: 1.70 cm TAPSE (M-mode): 2.2 cm LEFT ATRIUM             Index        RIGHT ATRIUM           Index LA diam:        6.00 cm 2.70 cm/m   RA Area:     23.00 cm LA Vol (A2C):   89.0 ml 40.02 ml/m  RA Volume:   68.00 ml  30.57 ml/m LA Vol (A4C):   89.1 ml 40.06 ml/m LA Biplane Vol: 94.3 ml 42.40 ml/m  AORTIC VALVE AV Area (Vmax):    2.71 cm AV Area (Vmean):   2.67 cm AV Area (VTI):     2.96 cm AV Vmax:           105.00 cm/s AV Vmean:          68.500 cm/s AV VTI:            0.133 m AV Peak Grad:      4.4 mmHg AV Mean Grad:      2.0 mmHg LVOT Vmax:         74.80 cm/s LVOT Vmean:        48.040 cm/s LVOT VTI:          0.104 m LVOT/AV VTI ratio: 0.78  AORTA Ao Root diam: 3.30 cm Ao Asc diam:  3.30 cm  MITRAL VALVE MV Area (PHT): 7.99 cm    SHUNTS MV Decel Time: 95 msec     Systemic VTI:  0.10 m MV E velocity: 54.80 cm/s  Systemic Diam: 2.20 cm MV A velocity: 56.90 cm/s MV E/A ratio:  0.96 Dalton McleanMD Electronically signed by Franki Monte Signature Date/Time: 11/17/2022/2:35:49 PM    Final (Updated)    DG Chest Port 1 View  Result Date: 11/16/2022 CLINICAL DATA:  Shortness of breath EXAM: PORTABLE CHEST 1 VIEW COMPARISON:  09/21/2022 FINDINGS: Marked cardiomegaly. Pulmonary vascular congestion with diffuse bilateral interstitial opacities. No large pleural fluid collection, although trace bilateral pleural effusions are not excluded. No pneumothorax. IMPRESSION: Findings of CHF with pulmonary edema. Electronically Signed   By: Davina Poke D.O.   On: 11/16/2022 09:43     Discharge Instructions: Discharge Instructions     Diet - low sodium heart healthy   Complete by: As directed    Discharge instructions   Complete by: As directed    We think you had a heart failure exacerbation due to running out of your medicines. We will discharge you with a supply of medicines, please follow up with our clinic for refills.  1. Please make sure to follow up with our clinic and the advanced heart failure clinic 2. If you start having more chest pain, trouble breathing, or notice >10 lbs weight gain in a week or 5 lbs weight gain in a day, please come back to the ED or call our clinic number.   Increase activity slowly  Complete by: As directed        Discharge Instructions   None     Signed: Linus Galas, MD 11/19/2022, 8:30 PM   Pager: [phone]

## 2022-11-20 ENCOUNTER — Telehealth (INDEPENDENT_AMBULATORY_CARE_PROVIDER_SITE_OTHER): Payer: Self-pay

## 2022-11-20 NOTE — Transitions of Care (Post Inpatient/ED Visit) (Signed)
   11/20/2022  Name: Justin [name] MRN: [id number] DOB: 16-Nov-1980  Today's TOC FU Call Status: Today's TOC FU Call Status:: Unsuccessul Call (1st Attempt) Unsuccessful Call (1st Attempt) Date: 11/20/22  Attempted to reach the patient regarding the most recent Inpatient/ED visit.  Follow Up Plan: Additional outreach attempts will be made to reach the patient to complete the Transitions of Care (Post Inpatient/ED visit) call.   Elsie LPN [place] Advisor Direct Dial [phone]

## 2022-11-21 NOTE — Transitions of Care (Post Inpatient/ED Visit) (Unsigned)
   11/21/2022  Name: Justin [name] MRN: [id number] DOB: 1981-01-04  Today's TOC FU Call Status: Today's TOC FU Call Status:: Unsuccessful Call (2nd Attempt) Unsuccessful Call (1st Attempt) Date: 11/20/22 Unsuccessful Call (2nd Attempt) Date: 11/21/22  Attempted to reach the patient regarding the most recent Inpatient/ED visit.  Follow Up Plan: Additional outreach attempts will be made to reach the patient to complete the Transitions of Care (Post Inpatient/ED visit) call.   Spencer LPN [place] Advisor Direct Dial [phone]

## 2022-11-22 NOTE — Transitions of Care (Post Inpatient/ED Visit) (Signed)
   11/22/2022  Name: Justin [name] MRN: [id number] DOB: 1981/04/06  Today's TOC FU Call Status: Today's TOC FU Call Status:: Unsuccessful Call (3rd Attempt) Unsuccessful Call (1st Attempt) Date: 11/20/22 Unsuccessful Call (2nd Attempt) Date: 11/21/22 Unsuccessful Call (3rd Attempt) Date: 11/22/22  Attempted to reach the patient regarding the most recent Inpatient/ED visit.  Follow Up Plan: No further outreach attempts will be made at this time. We have been unable to contact the patient.  [place] LPN [place] Advisor Direct Dial [phone]

## 2022-12-05 ENCOUNTER — Encounter: Payer: Self-pay | Admitting: Student

## 2022-12-11 ENCOUNTER — Inpatient Hospital Stay (INDEPENDENT_AMBULATORY_CARE_PROVIDER_SITE_OTHER): Payer: Self-pay | Admitting: Primary Care

## 2023-01-17 ENCOUNTER — Ambulatory Visit (INDEPENDENT_AMBULATORY_CARE_PROVIDER_SITE_OTHER): Payer: Self-pay | Admitting: Primary Care

## 2023-01-17 NOTE — Progress Notes (Signed)
  [facility]   Telephone Note  I did not connected with Kaidynn Spieker, by telephone interpreter  Para March [phone]  tried to reach [place] [facility] twice  no answer or voice mail    Grayce Sessions, NP 01/17/2023, 9:00 AM

## 2023-03-10 ENCOUNTER — Other Ambulatory Visit: Payer: Self-pay

## 2023-03-10 ENCOUNTER — Encounter (HOSPITAL_COMMUNITY): Payer: Self-pay

## 2023-03-10 ENCOUNTER — Emergency Department (HOSPITAL_COMMUNITY): Payer: Self-pay

## 2023-03-10 ENCOUNTER — Inpatient Hospital Stay (HOSPITAL_COMMUNITY)
Admission: EM | Admit: 2023-03-10 | Discharge: 2023-03-13 | DRG: 291 | Disposition: A | Discharge status: Home / Self Care | Payer: Self-pay | Attending: Internal Medicine | Admitting: Internal Medicine

## 2023-03-10 DIAGNOSIS — R7989 Other specified abnormal findings of blood chemistry: Secondary | ICD-10-CM | POA: Diagnosis present

## 2023-03-10 DIAGNOSIS — E785 Hyperlipidemia, unspecified: Secondary | ICD-10-CM

## 2023-03-10 DIAGNOSIS — E781 Pure hyperglyceridemia: Secondary | ICD-10-CM | POA: Diagnosis present

## 2023-03-10 DIAGNOSIS — I5043 Acute on chronic combined systolic (congestive) and diastolic (congestive) heart failure: Secondary | ICD-10-CM

## 2023-03-10 DIAGNOSIS — R011 Cardiac murmur, unspecified: Secondary | ICD-10-CM | POA: Diagnosis present

## 2023-03-10 DIAGNOSIS — T500X6A Underdosing of mineralocorticoids and their antagonists, initial encounter: Secondary | ICD-10-CM | POA: Diagnosis present

## 2023-03-10 DIAGNOSIS — Z91120 Patient's intentional underdosing of medication regimen due to financial hardship: Secondary | ICD-10-CM

## 2023-03-10 DIAGNOSIS — I130 Hypertensive heart and chronic kidney disease with heart failure and stage 1 through stage 4 chronic kidney disease, or unspecified chronic kidney disease: Principal | ICD-10-CM | POA: Diagnosis present

## 2023-03-10 DIAGNOSIS — Z91148 Patient's other noncompliance with medication regimen for other reason: Secondary | ICD-10-CM

## 2023-03-10 DIAGNOSIS — Z5982 Transportation insecurity: Secondary | ICD-10-CM

## 2023-03-10 DIAGNOSIS — E1122 Type 2 diabetes mellitus with diabetic chronic kidney disease: Secondary | ICD-10-CM | POA: Diagnosis present

## 2023-03-10 DIAGNOSIS — I509 Heart failure, unspecified: Secondary | ICD-10-CM

## 2023-03-10 DIAGNOSIS — N1831 Chronic kidney disease, stage 3a: Secondary | ICD-10-CM | POA: Diagnosis present

## 2023-03-10 DIAGNOSIS — Z597 Insufficient social insurance and welfare support: Secondary | ICD-10-CM

## 2023-03-10 DIAGNOSIS — I2489 Other forms of acute ischemic heart disease: Secondary | ICD-10-CM | POA: Diagnosis present

## 2023-03-10 DIAGNOSIS — N182 Chronic kidney disease, stage 2 (mild): Secondary | ICD-10-CM

## 2023-03-10 DIAGNOSIS — I10 Essential (primary) hypertension: Secondary | ICD-10-CM | POA: Diagnosis present

## 2023-03-10 DIAGNOSIS — Z6841 Body Mass Index (BMI) 40.0 and over, adult: Secondary | ICD-10-CM

## 2023-03-10 DIAGNOSIS — E66813 Obesity, class 3: Secondary | ICD-10-CM | POA: Diagnosis present

## 2023-03-10 DIAGNOSIS — N179 Acute kidney failure, unspecified: Secondary | ICD-10-CM | POA: Diagnosis present

## 2023-03-10 DIAGNOSIS — Z7984 Long term (current) use of oral hypoglycemic drugs: Secondary | ICD-10-CM

## 2023-03-10 DIAGNOSIS — Z7901 Long term (current) use of anticoagulants: Secondary | ICD-10-CM

## 2023-03-10 DIAGNOSIS — I5022 Chronic systolic (congestive) heart failure: Secondary | ICD-10-CM | POA: Diagnosis present

## 2023-03-10 DIAGNOSIS — Z560 Unemployment, unspecified: Secondary | ICD-10-CM

## 2023-03-10 DIAGNOSIS — I5023 Acute on chronic systolic (congestive) heart failure: Secondary | ICD-10-CM | POA: Diagnosis present

## 2023-03-10 DIAGNOSIS — Z7151 Drug abuse counseling and surveillance of drug abuser: Secondary | ICD-10-CM

## 2023-03-10 DIAGNOSIS — I480 Paroxysmal atrial fibrillation: Secondary | ICD-10-CM | POA: Diagnosis present

## 2023-03-10 DIAGNOSIS — T501X6A Underdosing of loop [high-ceiling] diuretics, initial encounter: Secondary | ICD-10-CM | POA: Diagnosis present

## 2023-03-10 DIAGNOSIS — I422 Other hypertrophic cardiomyopathy: Secondary | ICD-10-CM | POA: Diagnosis present

## 2023-03-10 DIAGNOSIS — Z8249 Family history of ischemic heart disease and other diseases of the circulatory system: Secondary | ICD-10-CM

## 2023-03-10 DIAGNOSIS — T447X6A Underdosing of beta-adrenoreceptor antagonists, initial encounter: Secondary | ICD-10-CM | POA: Diagnosis present

## 2023-03-10 DIAGNOSIS — E1169 Type 2 diabetes mellitus with other specified complication: Secondary | ICD-10-CM | POA: Diagnosis present

## 2023-03-10 DIAGNOSIS — Z79899 Other long term (current) drug therapy: Secondary | ICD-10-CM

## 2023-03-10 DIAGNOSIS — F1410 Cocaine abuse, uncomplicated: Secondary | ICD-10-CM | POA: Diagnosis present

## 2023-03-10 DIAGNOSIS — J9601 Acute respiratory failure with hypoxia: Secondary | ICD-10-CM

## 2023-03-10 DIAGNOSIS — J9621 Acute and chronic respiratory failure with hypoxia: Secondary | ICD-10-CM | POA: Diagnosis present

## 2023-03-10 DIAGNOSIS — F1010 Alcohol abuse, uncomplicated: Secondary | ICD-10-CM | POA: Diagnosis present

## 2023-03-10 DIAGNOSIS — Z5986 Financial insecurity: Secondary | ICD-10-CM

## 2023-03-10 DIAGNOSIS — E871 Hypo-osmolality and hyponatremia: Secondary | ICD-10-CM | POA: Diagnosis present

## 2023-03-10 LAB — BASIC METABOLIC PANEL
Anion gap: 10 (ref 5–15)
BUN: 19 mg/dL (ref 6–20)
CO2: 27 mmol/L (ref 22–32)
Calcium: 8.7 mg/dL — ABNORMAL LOW (ref 8.9–10.3)
Chloride: 98 mmol/L (ref 98–111)
Creatinine, Ser: 1.6 mg/dL — ABNORMAL HIGH (ref 0.61–1.24)
GFR, Estimated: 54 mL/min — ABNORMAL LOW (ref >60)
Glucose, Bld: 295 mg/dL — ABNORMAL HIGH (ref 70–99)
Potassium: 4.3 mmol/L (ref 3.5–5.1)
Sodium: 135 mmol/L (ref 135–145)

## 2023-03-10 LAB — CBC WITH DIFFERENTIAL/PLATELET
Abs Immature Granulocytes: 0.03 10*3/uL (ref 0.00–0.07)
Basophils Absolute: 0 10*3/uL (ref 0.0–0.1)
Basophils Relative: 1 %
Eosinophils Absolute: 0.2 10*3/uL (ref 0.0–0.5)
Eosinophils Relative: 3 %
HCT: 48.9 % (ref 39.0–52.0)
Hemoglobin: 15.3 g/dL (ref 13.0–17.0)
Immature Granulocytes: 0 %
Lymphocytes Relative: 13 %
Lymphs Abs: 1 10*3/uL (ref 0.7–4.0)
MCH: 29.5 pg (ref 26.0–34.0)
MCHC: 31.3 g/dL (ref 30.0–36.0)
MCV: 94.4 fL (ref 80.0–100.0)
Monocytes Absolute: 0.6 10*3/uL (ref 0.1–1.0)
Monocytes Relative: 7 %
Neutro Abs: 6.1 10*3/uL (ref 1.7–7.7)
Neutrophils Relative %: 76 %
Platelets: 267 10*3/uL (ref 150–400)
RBC: 5.18 MIL/uL (ref 4.22–5.81)
RDW: 15.1 % (ref 11.5–15.5)
WBC: 8 10*3/uL (ref 4.0–10.5)
nRBC: 0 % (ref 0.0–0.2)

## 2023-03-10 LAB — GLUCOSE, CAPILLARY
Glucose-Capillary: 194 mg/dL — ABNORMAL HIGH (ref 70–99)
Glucose-Capillary: 200 mg/dL — ABNORMAL HIGH (ref 70–99)

## 2023-03-10 LAB — RAPID URINE DRUG SCREEN, HOSP PERFORMED
Amphetamines: NOT DETECTED
Barbiturates: NOT DETECTED
Benzodiazepines: NOT DETECTED
Cocaine: NOT DETECTED
Opiates: NOT DETECTED
Tetrahydrocannabinol: NOT DETECTED

## 2023-03-10 LAB — TROPONIN I (HIGH SENSITIVITY)
Troponin I (High Sensitivity): 78 ng/L — ABNORMAL HIGH (ref <18)
Troponin I (High Sensitivity): 88 ng/L — ABNORMAL HIGH (ref <18)

## 2023-03-10 LAB — BRAIN NATRIURETIC PEPTIDE: B Natriuretic Peptide: 697.7 pg/mL — ABNORMAL HIGH (ref 0.0–100.0)

## 2023-03-10 MED ORDER — ACETAMINOPHEN 325 MG PO TABS
650.0000 mg | ORAL_TABLET | ORAL | Status: DC | PRN
  Administered 2023-03-10 – 2023-03-11 (×2): 650 mg via ORAL
  Filled 2023-03-10 (×2): qty 2

## 2023-03-10 MED ORDER — ONDANSETRON HCL 4 MG/2ML IJ SOLN
4.0000 mg | Freq: Four times a day (QID) | INTRAMUSCULAR | Status: DC | PRN
  Administered 2023-03-10: 4 mg via INTRAVENOUS
  Filled 2023-03-10: qty 2

## 2023-03-10 MED ORDER — CLONIDINE HCL 0.2 MG PO TABS
0.2000 mg | ORAL_TABLET | Freq: Once | ORAL | Status: AC
  Administered 2023-03-10: 0.2 mg via ORAL
  Filled 2023-03-10: qty 1

## 2023-03-10 MED ORDER — SPIRONOLACTONE 12.5 MG HALF TABLET
12.5000 mg | ORAL_TABLET | Freq: Every day | ORAL | Status: DC
  Administered 2023-03-10: 12.5 mg via ORAL
  Filled 2023-03-10: qty 1

## 2023-03-10 MED ORDER — FUROSEMIDE 10 MG/ML IJ SOLN: 40.0000 mg | Freq: Two times a day (BID) | INTRAMUSCULAR | Status: DC

## 2023-03-10 MED ORDER — SODIUM CHLORIDE 0.9% FLUSH
3.0000 mL | Freq: Two times a day (BID) | INTRAVENOUS | Status: DC
  Administered 2023-03-10 – 2023-03-13 (×7): 3 mL via INTRAVENOUS

## 2023-03-10 MED ORDER — EMPAGLIFLOZIN 10 MG PO TABS
10.0000 mg | ORAL_TABLET | Freq: Every day | ORAL | Status: DC
  Administered 2023-03-10 – 2023-03-13 (×4): 10 mg via ORAL
  Filled 2023-03-10 (×4): qty 1

## 2023-03-10 MED ORDER — SACUBITRIL-VALSARTAN 97-103 MG PO TABS
1.0000 | ORAL_TABLET | Freq: Two times a day (BID) | ORAL | Status: DC
  Administered 2023-03-10: 1 via ORAL
  Filled 2023-03-10 (×2): qty 1

## 2023-03-10 MED ORDER — FUROSEMIDE 10 MG/ML IJ SOLN
80.0000 mg | Freq: Two times a day (BID) | INTRAMUSCULAR | Status: DC
  Administered 2023-03-10 – 2023-03-13 (×6): 80 mg via INTRAVENOUS
  Filled 2023-03-10 (×6): qty 8

## 2023-03-10 MED ORDER — ASPIRIN 81 MG PO TBEC: 81.0000 mg | DELAYED_RELEASE_TABLET | Freq: Every day | ORAL | Status: DC

## 2023-03-10 MED ORDER — METOPROLOL SUCCINATE ER 25 MG PO TB24: 50.0000 mg | ORAL_TABLET | Freq: Every day | ORAL | Status: DC

## 2023-03-10 MED ORDER — OXYCODONE HCL 5 MG PO TABS
5.0000 mg | ORAL_TABLET | Freq: Once | ORAL | Status: AC
  Administered 2023-03-10: 5 mg via ORAL
  Filled 2023-03-10: qty 1

## 2023-03-10 MED ORDER — THIAMINE MONONITRATE 100 MG PO TABS
100.0000 mg | ORAL_TABLET | Freq: Every day | ORAL | Status: DC
  Administered 2023-03-10 – 2023-03-11 (×2): 100 mg via ORAL
  Filled 2023-03-10 (×2): qty 1

## 2023-03-10 MED ORDER — FUROSEMIDE 10 MG/ML IJ SOLN
40.0000 mg | Freq: Once | INTRAMUSCULAR | Status: AC
  Administered 2023-03-10: 40 mg via INTRAVENOUS
  Filled 2023-03-10: qty 4

## 2023-03-10 MED ORDER — ROSUVASTATIN CALCIUM 5 MG PO TABS
10.0000 mg | ORAL_TABLET | Freq: Every day | ORAL | Status: DC
  Administered 2023-03-10 – 2023-03-13 (×4): 10 mg via ORAL
  Filled 2023-03-10 (×5): qty 2

## 2023-03-10 MED ORDER — RIVAROXABAN 20 MG PO TABS
20.0000 mg | ORAL_TABLET | Freq: Every day | ORAL | Status: DC
  Administered 2023-03-10 – 2023-03-12 (×3): 20 mg via ORAL
  Filled 2023-03-10 (×3): qty 1

## 2023-03-10 MED ORDER — ADULT MULTIVITAMIN W/MINERALS CH
1.0000 | ORAL_TABLET | Freq: Every day | ORAL | Status: DC
  Administered 2023-03-10 – 2023-03-13 (×4): 1 via ORAL
  Filled 2023-03-10 (×4): qty 1

## 2023-03-10 MED ORDER — LORAZEPAM 1 MG PO TABS: 1.0000 mg | ORAL_TABLET | ORAL | Status: DC | PRN

## 2023-03-10 MED ORDER — INSULIN ASPART 100 UNIT/ML IJ SOLN: 0.0000 [IU] | Freq: Every day | INTRAMUSCULAR | Status: DC

## 2023-03-10 MED ORDER — METOPROLOL SUCCINATE ER 50 MG PO TB24
50.0000 mg | ORAL_TABLET | Freq: Every day | ORAL | Status: DC
  Administered 2023-03-10: 50 mg via ORAL
  Filled 2023-03-10: qty 2

## 2023-03-10 MED ORDER — SODIUM CHLORIDE 0.9% FLUSH: 3.0000 mL | INTRAVENOUS | Status: DC | PRN

## 2023-03-10 MED ORDER — ACETAMINOPHEN 500 MG PO TABS
1000.0000 mg | ORAL_TABLET | Freq: Once | ORAL | Status: AC
  Administered 2023-03-10: 1000 mg via ORAL
  Filled 2023-03-10: qty 2

## 2023-03-10 MED ORDER — SODIUM CHLORIDE 0.9 % IV SOLN: 250.0000 mL | INTRAVENOUS | Status: DC | PRN

## 2023-03-10 MED ORDER — FOLIC ACID 1 MG PO TABS
1.0000 mg | ORAL_TABLET | Freq: Every day | ORAL | Status: DC
  Administered 2023-03-10 – 2023-03-13 (×4): 1 mg via ORAL
  Filled 2023-03-10 (×4): qty 1

## 2023-03-10 MED ORDER — INSULIN ASPART 100 UNIT/ML IJ SOLN
0.0000 [IU] | Freq: Three times a day (TID) | INTRAMUSCULAR | Status: DC
  Administered 2023-03-10: 3 [IU] via SUBCUTANEOUS
  Administered 2023-03-11: 8 [IU] via SUBCUTANEOUS
  Administered 2023-03-11: 5 [IU] via SUBCUTANEOUS
  Administered 2023-03-12 (×2): 2 [IU] via SUBCUTANEOUS
  Administered 2023-03-12: 3 [IU] via SUBCUTANEOUS
  Administered 2023-03-13: 2 [IU] via SUBCUTANEOUS
  Administered 2023-03-13: 3 [IU] via SUBCUTANEOUS

## 2023-03-10 MED ORDER — THIAMINE HCL 100 MG/ML IJ SOLN: 100.0000 mg | Freq: Every day | INTRAMUSCULAR | Status: DC

## 2023-03-10 NOTE — ED Provider Notes (Signed)
[place] EMERGENCY DEPARTMENT AT [facility] Provider Note   CSN: [id number] Arrival date & time: 03/10/23  4098     History  Chief Complaint  Patient presents with   Leg Swelling   Shortness of Breath    Justin [name] is a 42 y.o. male with a past medical history of CHF, A-fib with RVR, diabetes who presents emergency department with concerns for bilateral lower extremity swelling x 1 week.  No shortness of breath for the past week as well.  Notes he ran out of all of his medications a month ago. Denies chest pain, cough, rhinorrhea, nasal congestion.  Does not wear oxygen at home at baseline.  Does not have a primary care provider at this time.  The history is provided by the patient. No language interpreter was used.       Home Medications Prior to Admission medications   Medication Sig Start Date End Date Taking? Authorizing Provider  empagliflozin (JARDIANCE) 10 MG TABS tablet Take 1 tablet (10 mg total) by mouth daily. 11/17/22   Merrilyn Puma, MD  furosemide (LASIX) 40 MG tablet Take 1.5 tablets (60 mg total) by mouth daily. 11/17/22   Merrilyn Puma, MD  metFORMIN (GLUCOPHAGE) 1000 MG tablet Take 1 tablet (1,000 mg total) by mouth 2 (two) times daily with a meal. 11/17/22   Merrilyn Puma, MD  metoprolol succinate (TOPROL-XL) 50 MG 24 hr tablet Take 1 tablet (50 mg total) by mouth daily. Take with or immediately following a meal. 11/17/22   Merrilyn Puma, MD  rivaroxaban (XARELTO) 20 MG TABS tablet Take 1 tablet (20 mg total) by mouth daily with supper. 11/17/22   Merrilyn Puma, MD  rosuvastatin (CRESTOR) 10 MG tablet Take 1 tablet (10 mg total) by mouth daily. 11/17/22 12/17/22  Merrilyn Puma, MD  sacubitril-valsartan (ENTRESTO) 97-103 MG Take 1 tablet by mouth 2 (two) times daily. 11/17/22   Merrilyn Puma, MD  spironolactone (ALDACTONE) 25 MG tablet Take 1/2 tablet (12.5 mg total) by mouth daily. 11/17/22 12/17/22  Merrilyn Puma, MD      Allergies     Patient has no known allergies.    Review of Systems   Review of Systems  Respiratory:  Positive for shortness of breath.   All other systems reviewed and are negative.   Physical Exam Updated Vital Signs BP (!) 166/141   Pulse (!) 114   Temp 99.4 F (37.4 C) (Oral)   Resp (!) 34   Ht 5\' 6"  (1.676 m)   Wt 115.2 kg   SpO2 91%   BMI 41.00 kg/m  Physical Exam Vitals and nursing note reviewed.  Constitutional:      General: He is not in acute distress.    Appearance: He is diaphoretic.     Comments: Mild diaphoresis noted  HENT:     Head: Normocephalic and atraumatic.     Mouth/Throat:     Pharynx: No oropharyngeal exudate.  Eyes:     General: No scleral icterus.    Conjunctiva/sclera: Conjunctivae normal.  Cardiovascular:     Rate and Rhythm: Normal rate and regular rhythm.     Pulses: Normal pulses.     Heart sounds: Normal heart sounds.  Pulmonary:     Effort: Pulmonary effort is normal. No respiratory distress.     Breath sounds: Normal breath sounds. Decreased air movement present. No wheezing.     Comments: Diminished breath sounds noted throughout all lung fields.  Patient able to speak in clear  complete sentences. Abdominal:     General: Bowel sounds are normal.     Palpations: Abdomen is soft. There is no mass.     Tenderness: There is no abdominal tenderness. There is no guarding or rebound.  Musculoskeletal:        General: Normal range of motion.     Cervical back: Normal range of motion and neck supple.  Skin:    General: Skin is warm.  Neurological:     Mental Status: He is alert.  Psychiatric:        Behavior: Behavior normal.     ED Results / Procedures / Treatments   Labs (all labs ordered are listed, but only abnormal results are displayed) Labs Reviewed  BASIC METABOLIC PANEL - Abnormal; Notable for the following components:      Result Value   Glucose, Bld 295 (*)    Creatinine, Ser 1.60 (*)    Calcium 8.7 (*)    GFR, Estimated 54  (*)    All other components within normal limits  BRAIN NATRIURETIC PEPTIDE - Abnormal; Notable for the following components:   B Natriuretic Peptide 697.7 (*)    All other components within normal limits  TROPONIN I (HIGH SENSITIVITY) - Abnormal; Notable for the following components:   Troponin I (High Sensitivity) 78 (*)    All other components within normal limits  TROPONIN I (HIGH SENSITIVITY) - Abnormal; Notable for the following components:   Troponin I (High Sensitivity) 88 (*)    All other components within normal limits  CBC WITH DIFFERENTIAL/PLATELET    EKG EKG Interpretation Date/Time:  Saturday March 10 2023 09:06:08 EDT Ventricular Rate:  113 PR Interval:  163 QRS Duration:  105 QT Interval:  354 QTC Calculation: 486 R Axis:   168  Text Interpretation: Sinus tachycardia Left atrial enlargement Confirmed by Alvester Chou [phone]) on 03/10/2023 9:30:38 AM  Radiology DG Chest 2 View  Result Date: 03/10/2023 CLINICAL DATA:  42 year old male with history of shortness of breath and left leg swelling. EXAM: CHEST - 2 VIEW COMPARISON:  Chest x-ray 11/16/2022. FINDINGS: There is cephalization of the pulmonary vasculature, indistinctness of the interstitial markings, and patchy airspace disease throughout the lungs bilaterally suggestive of moderate pulmonary edema. Small bilateral pleural effusions. Moderate cardiomegaly. Upper mediastinal contours are within normal limits. IMPRESSION: 1. The appearance of the chest suggests moderate to severe congestive heart failure, as above. Electronically Signed   By: Trudie Reed M.D.   On: 03/10/2023 10:08    Procedures Procedures    Medications Ordered in ED Medications  metoprolol succinate (TOPROL-XL) 24 hr tablet 50 mg (50 mg Oral Given 03/10/23 0948)  rosuvastatin (CRESTOR) tablet 10 mg (10 mg Oral Given 03/10/23 1418)  empagliflozin (JARDIANCE) tablet 10 mg (10 mg Oral Given 03/10/23 1418)  rivaroxaban (XARELTO) tablet 20 mg  (has no administration in time range)  sodium chloride flush (NS) 0.9 % injection 3 mL (3 mLs Intravenous Given 03/10/23 1419)  sodium chloride flush (NS) 0.9 % injection 3 mL (has no administration in time range)  0.9 %  sodium chloride infusion (has no administration in time range)  acetaminophen (TYLENOL) tablet 650 mg (has no administration in time range)  ondansetron (ZOFRAN) injection 4 mg (has no administration in time range)  furosemide (LASIX) injection 40 mg (has no administration in time range)  acetaminophen (TYLENOL) tablet 1,000 mg (1,000 mg Oral Given 03/10/23 0948)  furosemide (LASIX) injection 40 mg (40 mg Intravenous Given 03/10/23 0948)  cloNIDine (CATAPRES)  tablet 0.2 mg (0.2 mg Oral Given 03/10/23 0948)    ED Course/ Medical Decision Making/ A&P Clinical Course as of 03/10/23 1456  Sat Mar 10, 2023  0929 This is a 42 year old gentleman is primarily Spanish-speaking, Spanish translator was used, presented to ED with complaint of shortness of breath, fevers, dyspnea on exertion.  The patient has a history of very advanced congestive heart failure with an EF of 25 to 30% on most recent echocardiogram 3 months ago, in March, which was his most recent hospitalization.  He reports that he has been out of all of his medications for about a month.  He reports worsening swelling in his legs, abdomen, shortness of breath.  Also feels that he has been having fevers for "a few days".  On arrival patient is temperature of 99.4, he is tachycardic, has elevated blood pressure, he is tachypneic and hypoxic requiring 4 to 5 L nasal cannula.  He is able to speak in full sentences.  He is diaphoretic on exam.  He has a mild systolic murmur.  He has significant pitting edema of the lower extremities to the mid thigh bilaterally.  Clinically this is consistent with congestive heart failure exacerbation, and there may also be an underlying infection.  Patient is pending labs, x-ray imaging.  I have ordered  IV diuretics as well as blood pressure medications for significantly elevated blood pressure.  He is stable on 4 L nasal cannula at this time, is not requiring BiPAP or intubation, but will need to be watched closely. [MT]  1116 B Natriuretic Peptide(!): 697.7 [SB]  1131 Re-evaluated and resting comfortably on stretcher.  Patient diaphoretic at this time.  Patient again declines chest pain.  Temperature taken again by myself in the ED with temperature at 99.8. [SB]  1230 Consult with hospitalist, Dr. Lajuana Ripple who will evaluate for admission. [SB]    Clinical Course User Index [MT] Trifan, Kermit Balo, MD [SB] Brekyn Huntoon A, PA-C                             Medical Decision Making Amount and/or Complexity of Data Reviewed Labs: ordered. Decision-making details documented in ED Course. Radiology: ordered.  Risk OTC drugs. Prescription drug management. Decision regarding hospitalization.   Patient presents to the ED complaining of shortness of breath and BLE swelling onset 1 week.  Vital signs patient consented to 99.4, tachycardic at 114, tachypneic at 34. Patient does not wear oxygen at baseline.  On exam patient with 2+ pitting edema noted to bilateral lower extremities.  1+ pitting edema to trace pitting edema noted above the level of the knee and to abdomen. No acute cardiovascular, respiratory, abdominal exam findings. Recent echocardiogram on 11/17/2022 showed EF of 20-25%. Differential diagnosis includes CHF exacerbation, ACS, PTX, PNA, PE.   Co morbidities that complicate the patient evaluation: CHF, A-fib with RVR, diabetes  Additional history obtained:  External records from outside source obtained and reviewed including: Patient has been admitted to the hospital multiple times over the past 6 months for acute CHF exacerbation.  Labs:  I ordered, and personally interpreted labs.  The pertinent results include:   CBC unremarkable BMP with elevated glucose at 295,  BNP elevated  at 697.7 Initial troponin at 78, delta troponin at 88  Imaging: I ordered imaging studies including CXR I independently visualized and interpreted imaging which showed:  1. The appearance of the chest suggests moderate to severe  congestive heart  failure, as above.   I agree with the radiologist interpretation  Medications:  I ordered medication including Tylenol, Lasix, home doses of clonidine, spironolactone, metoprolol, for symptom management Reevaluation of the patient after these medicines and interventions, I reevaluated the patient and found that they have improved I have reviewed the patients home medicines and have made adjustments as needed    Consultations: I requested consultation with the Hospitalist, Dr. Lajuana Ripple and discussed lab and imaging findings as well as pertinent plan - they recommend: admission to the hospital  Social Determinants of Health: Patient does not have a primary care provider at this time  Disposition: Presenting suspicious for CHF exacerbation.  Also concerns for new oxygen requirement due to hypoxia today in the ED is 74%.  Doubt concerns at this time for PE, PTX, pneumonia, ACS.  Troponins elevated however could be due to patient's CHF exacerbation due to patient without chest pain at this time. After consideration of the diagnostic results and the patients response to treatment, I feel that the patient would benefit from Admission to the hospital.  Discussed with patient plans for admission due to new oxygen requirement.  Patient agreeable at this time.  Patient appears safe for admission.   This chart was dictated using voice recognition software, Dragon. Despite the best efforts of this provider to proofread and correct errors, errors may still occur which can change documentation meaning.   Final Clinical Impression(s) / ED Diagnoses Final diagnoses:  Acute respiratory failure with hypoxia (HCC)  Acute on chronic congestive heart failure,  unspecified heart failure type [facility])    Rx / DC Orders ED Discharge Orders     None         Julyssa Kyer A, PA-C 03/10/23 1458    Terald Sleeper, MD 03/10/23 2029

## 2023-03-10 NOTE — ED Provider Notes (Signed)
.  Critical Care  Performed by: Terald Sleeper, MD Authorized by: Terald Sleeper, MD   Critical care provider statement:    Critical care time (minutes):  30   Critical care time was exclusive of:  Separately billable procedures and treating other patients   Critical care was necessary to treat or prevent imminent or life-threatening deterioration of the following conditions:  Cardiac failure   Critical care was time spent personally by me on the following activities:  Ordering and performing treatments and interventions, ordering and review of laboratory studies, ordering and review of radiographic studies, pulse oximetry, review of old charts, examination of patient and evaluation of patient's response to treatment Comments:     Diuresis for CHF with hypoxia, new o2 requirement     Terald Sleeper, MD 03/10/23 2030

## 2023-03-10 NOTE — ED Notes (Signed)
Pt left the floor in stable condition, AOX4, with his belongings and staff.

## 2023-03-10 NOTE — ED Triage Notes (Addendum)
Pt c/o increasing SOB and BLE swelling x1 week.  Denies pain.  Pt reports running out of medication x1 month ago.   Significant swelling noted to BLEs. Pt's O2 initally 78% RA.  Pt increased to 92% on 4L [place].  Interpreter: Crissie Reese [phone]

## 2023-03-10 NOTE — Plan of Care (Signed)
  Problem: Education: Goal: Knowledge of General Education information will improve Description: Including pain rating scale, medication(s)/side effects and non-pharmacologic comfort measures Outcome: Progressing   Problem: Health Behavior/Discharge Planning: Goal: Ability to manage health-related needs will improve Outcome: Progressing   Problem: Nutrition: Goal: Adequate nutrition will be maintained Outcome: Progressing   Problem: Coping: Goal: Level of anxiety will decrease Outcome: Progressing   Problem: Elimination: Goal: Will not experience complications related to bowel motility Outcome: Progressing Goal: Will not experience complications related to urinary retention Outcome: Progressing   Problem: Safety: Goal: Ability to remain free from injury will improve Outcome: Progressing   Problem: Clinical Measurements: Goal: Respiratory complications will improve Outcome: Not Progressing   Problem: Pain Managment: Goal: General experience of comfort will improve Outcome: Not Progressing

## 2023-03-10 NOTE — H&P (Addendum)
History and Physical    DOA: 03/10/2023  PCP: Grayce Sessions, NP  Patient coming from: Home  Chief Complaint: Leg swellings and dyspnea  HPI: Justin [name] is a 42 y.o. Spanish-speaking male with history h/o hypertension, systolic CHF EF 30 to 35% who presents with complaints of volume overload with leg swellings extending to abdomen as well as dyspnea over the last week.  Denies any swelling in his genitals.  Denies any cough.  Patient states he lost his job and hence could not afford medications.  He has been out of his home medications including diuretics for at least a month now.  Patient denies any chest pain or dizziness or palpitations.  He does report orthopnea and some PND.  History obtained by interviewing patient with Spanish video interpreter at bedside.  Patient quite somnolent during the interview and had to be woken up several times to answer the questions.  He appeared to be communicating appropriately without any signs of confusion when he did wake up to answer. ED course: Afebrile, pulse 87-116, RR 35 on arrival-now at 18, BP 100/87.  O2 sat initially 74% on room air-improved to 95% on 4 L O2 nasal cannula.  Labs showed WBC 8, hemoglobin 15.3, hematocrit 48.9, platelet 267.  Sodium 135, potassium 4.3, chloride 98, bicarb 27, glucose 295, BUN 19, creatinine 1.6, calcium 8.7, BNP 697.7, HS troponin 78-->88.  chest x-ray suggestive of moderate to severe CHF.  Patient requested to be admitted for management of CHF exacerbation and acute hypoxic respiratory failure.   Review of Systems: As per HPI, otherwise review of systems negative.    Past Medical History:  Diagnosis Date   Heart failure with reduced ejection fraction (HCC) 2017   [facility]   Hypertension 2017   [facility]    Past Surgical History:  Procedure Laterality Date   RIGHT/LEFT HEART CATH AND CORONARY ANGIOGRAPHY N/A 02/16/2022   Procedure: RIGHT/LEFT HEART CATH AND  CORONARY ANGIOGRAPHY;  Surgeon: Tonny Bollman, MD;  Location: [facility] INVASIVE CV LAB;  Service: Cardiovascular;  Laterality: N/A;    Social history:  reports that he has never smoked. He has never used smokeless tobacco. He reports current alcohol use of about 30.0 standard drinks of alcohol per week. He reports that he does not currently use drugs after having used the following drugs: "Crack" cocaine and Cocaine.   No Known Allergies  Family History  Problem Relation Age of Onset   Hypertension Father       Prior to Admission medications   Medication Sig Start Date End Date Taking? Authorizing Provider  empagliflozin (JARDIANCE) 10 MG TABS tablet Take 1 tablet (10 mg total) by mouth daily. 11/17/22   Merrilyn Puma, MD  furosemide (LASIX) 40 MG tablet Take 1.5 tablets (60 mg total) by mouth daily. 11/17/22   Merrilyn Puma, MD  metFORMIN (GLUCOPHAGE) 1000 MG tablet Take 1 tablet (1,000 mg total) by mouth 2 (two) times daily with a meal. 11/17/22   Merrilyn Puma, MD  metoprolol succinate (TOPROL-XL) 50 MG 24 hr tablet Take 1 tablet (50 mg total) by mouth daily. Take with or immediately following a meal. 11/17/22   Merrilyn Puma, MD  rivaroxaban (XARELTO) 20 MG TABS tablet Take 1 tablet (20 mg total) by mouth daily with supper. 11/17/22   Merrilyn Puma, MD  rosuvastatin (CRESTOR) 10 MG tablet Take 1 tablet (10 mg total) by mouth daily. 11/17/22 12/17/22  Merrilyn Puma, MD  sacubitril-valsartan (ENTRESTO) 97-103 MG Take  1 tablet by mouth 2 (two) times daily. 11/17/22   Merrilyn Puma, MD  spironolactone (ALDACTONE) 25 MG tablet Take 1/2 tablet (12.5 mg total) by mouth daily. 11/17/22 12/17/22  Merrilyn Puma, MD    Physical Exam: Vitals:   03/10/23 1315 03/10/23 1330 03/10/23 1339 03/10/23 1401  BP: 124/84 100/87  110/72  Pulse: 87 90  88  Resp: (!) 22 (!) 29  18  Temp:   97.9 F (36.6 C) 97.7 F (36.5 C)  TempSrc:   Oral Oral  SpO2: 94% 95%  95%  Weight:      Height:        Constitutional:  Patient somnolent and appears to be somewhat tachypneic and sweaty  eyes: PERRL, lids and conjunctivae normal ENMT: Mucous membranes are moist. Posterior pharynx clear of any exudate or lesions.Normal dentition.  Neck: normal, supple, no masses, no thyromegaly Respiratory: clear to auscultation bilaterally, no wheezing, mild basilar crackles.  Mildly dyspneic on talking full sentences.  Comfortable on 4 L O2 and no accessory muscle use currently.  Cardiovascular: Regular rate and rhythm, no murmurs / rubs / gallops.  3+ pitting bilateral lower extremity edema extending into the thighs. 2+ pedal pulses. No carotid bruits.  Abdomen: Lower abdominal wall edema, old surgical scar in the right abdomen, no tenderness, no masses palpated. No hepatosplenomegaly. Bowel sounds positive.  Musculoskeletal: no clubbing / cyanosis. No joint deformity upper and lower extremities. Good ROM, no contractures. Normal muscle tone.  Neurologic: CN 2-12 grossly intact. Sensation intact, DTR normal. Strength 5/5 in all 4.  Psychiatric: Normal judgment and insight. Alert and oriented x 3. Normal mood.  SKIN/catheters: no rashes, lesions, ulcers. No induration  Labs on Admission: I have personally reviewed following labs and imaging studies  CBC: Recent Labs  Lab 03/10/23 0918  WBC 8.0  NEUTROABS 6.1  HGB 15.3  HCT 48.9  MCV 94.4  PLT 267   Basic Metabolic Panel: Recent Labs  Lab 03/10/23 0918  NA 135  K 4.3  CL 98  CO2 27  GLUCOSE 295*  BUN 19  CREATININE 1.60*  CALCIUM 8.7*   GFR: Estimated Creatinine Clearance: 71.1 mL/min (A) (by C-G formula based on SCr of 1.6 mg/dL (H)). Recent Labs  Lab 03/10/23 0918  WBC 8.0   Liver Function Tests: No results for input(s): "AST", "ALT", "ALKPHOS", "BILITOT", "PROT", "ALBUMIN" in the last 168 hours. No results for input(s): "LIPASE", "AMYLASE" in the last 168 hours. No results for input(s): "AMMONIA" in the last 168 hours. Coagulation Profile: No  results for input(s): "INR", "PROTIME" in the last 168 hours. Cardiac Enzymes: No results for input(s): "CKTOTAL", "CKMB", "CKMBINDEX", "TROPONINI" in the last 168 hours. BNP (last 3 results) No results for input(s): "PROBNP" in the last 8760 hours. HbA1C: No results for input(s): "HGBA1C" in the last 72 hours. CBG: No results for input(s): "GLUCAP" in the last 168 hours. Lipid Profile: No results for input(s): "CHOL", "HDL", "LDLCALC", "TRIG", "CHOLHDL", "LDLDIRECT" in the last 72 hours. Thyroid Function Tests: No results for input(s): "TSH", "T4TOTAL", "FREET4", "T3FREE", "THYROIDAB" in the last 72 hours. Anemia Panel: No results for input(s): "VITAMINB12", "FOLATE", "FERRITIN", "TIBC", "IRON", "RETICCTPCT" in the last 72 hours. Urine analysis:    Component Value Date/Time   COLORURINE COLORLESS (A) 02/09/2022 1037   APPEARANCEUR CLEAR 02/09/2022 1037   LABSPEC 1.009 02/09/2022 1037   PHURINE 6.0 02/09/2022 1037   GLUCOSEU NEGATIVE 02/09/2022 1037   HGBUR NEGATIVE 02/09/2022 1037   BILIRUBINUR NEGATIVE 02/09/2022 1037  KETONESUR NEGATIVE 02/09/2022 1037   PROTEINUR NEGATIVE 02/09/2022 1037   NITRITE NEGATIVE 02/09/2022 1037   LEUKOCYTESUR NEGATIVE 02/09/2022 1037    Radiological Exams on Admission: Personally reviewed  DG Chest 2 View  Result Date: 03/10/2023 CLINICAL DATA:  42 year old male with history of shortness of breath and left leg swelling. EXAM: CHEST - 2 VIEW COMPARISON:  Chest x-ray 11/16/2022. FINDINGS: There is cephalization of the pulmonary vasculature, indistinctness of the interstitial markings, and patchy airspace disease throughout the lungs bilaterally suggestive of moderate pulmonary edema. Small bilateral pleural effusions. Moderate cardiomegaly. Upper mediastinal contours are within normal limits. IMPRESSION: 1. The appearance of the chest suggests moderate to severe congestive heart failure, as above. Electronically Signed   By: Trudie Reed M.D.   On:  03/10/2023 10:08    EKG: Independently reviewed.  Sinus tachycardia with nonspecific (upsloping) ST-T changes in V2 V3 V4, QTc 486 ms     Assessment and Plan:   Principal Problem:   Acute hypoxemic respiratory failure (HCC) Active Problems:   Acute on chronic congestive heart failure (HCC)   Paroxysmal atrial fibrillation with RVR (HCC)   Essential hypertension   Type 2 diabetes mellitus with hyperlipidemia (HCC)   Chronic kidney disease, stage II (mild)   Alcohol abuse   Hypertriglyceridemia    1.  Acute hypoxic respiratory failure secondary to acute on chronic combined systolic/diastolic CHF exacerbation: Last echo in March showed EF 20 to 25% along with grade 1 diastolic dysfunction.Patient currently saturating well on 4 L O2.  Received 40 mg IV Lasix in the ED.  Will admit with 40 mg IV Lasix twice daily.  Monitor I's and O's, daily weights.  Patient will need medication assistance and follow-up with PCP set up at the time of discharge.Noted to be on Entresto, metoprolol, Aldactone in addition to Lasix previously.  Will resume metoprolol on hold others for now.  2.  Troponinemia: Likely secondary to demand ischemia in the setting of problem #1.  Patient denies any chest pain.  EKG with nonspecific ST-T changes.  Given uptrending troponin, will consult cardiology. Hold B-blockers given acute CHF and resume xarelto.  Last echocardiogram done in March 2024.  Will defer for now unless cardiology recommends.  3.  History of paroxysmal atrial fibrillation: Currently appears to be in sinus although tachycardic.  Resume metoprolol and Xarelto  4.  CKD stage III A: Baseline creatinine appears to be 1.4-1.5.  Currently slightly elevated at 1.6.  Hold Aldactone, Entresto for now and monitor while diuresing aggressively.  If creatinine stable can resume these medications.  5.  Diabetes mellitus type 2: Resume Jardiance.  Given problem #4, will avoid metformin.  ADA diet  6.  Hyperlipidemia:  Resume Crestor.  Lipid profile in AM  7.  Alcohol /drug abuse: Patient reports drinking 1 can beer a day on an average.  Denies any prior alcohol withdrawals or hospitalizations for the same.  Will place on CIWA, thiamine. Denied drug abuse but noted coacine h/o. Will check UDS  DVT prophylaxis: On Xarelto   Code Status: Full code as confirmed by patient   .Health care proxy would be his brother [place]  Patient/Family Communication: Discussed with patient and all questions answered to satisfaction.  Consults called: Cardiology--d/w cardiology Dr. Shea Evans who reviewed his last cath from June 2023 and advised that it was more of alcohol/substance use related nonischemic cardiomyopathy.  Agreed with holding off on aspirin and beta-blockers for now.  Cardiology will evaluate patient today. Admission status :I certify  that at the point of admission it is my clinical judgment that the patient will require inpatient hospital care spanning beyond 2 midnights from the point of admission due to high intensity of service and high frequency of surveillance required.Inpatient status is judged to be reasonable and necessary in order to provide the required intensity of service to ensure the patient's safety. The patient's presenting symptoms, physical exam findings, and initial radiographic and laboratory data in the context of their chronic comorbidities is felt to place them at high risk for further clinical deterioration. The following factors support the patient status of inpatient : Anasarca with significant leg swelling/abdominal swelling, dyspnea/pulmonary edema/hypoxia requiring IV diuresis.     Alessandra Bevels MD [facility] Pager in [place]  If 7PM-7AM, please contact night-coverage [email or web address]   03/10/2023, 3:08 PM

## 2023-03-10 NOTE — Consult Note (Signed)
Cardiology Consultation   Patient ID: Justin [name] MRN: [id number]; DOB: 1981/07/27  Admit date: 03/10/2023 Date of Consult: 03/10/2023  PCP:  Grayce Sessions, NP   [facility] Providers Cardiologist:  Jodelle Red, MD  Electrophysiologist:  Regan Lemming, MD      Patient Profile:   Justin [name] is a 42 y.o. male with a hx of chronic systolic HF, NICM, uncontrolled HTN, DMII, alcohol abuse, and crack/cocaine abuse who is being seen 03/10/2023 for the evaluation of acute on chronic systolic HF exacerbation at the request of Dr. Lajuana Ripple.  History of Present Illness:   [name] is a 42 year old male with history as detailed above who follows with advanced HF as outpatient. Patient has known history of NICM that dates back to 2017. Admitted to [facility] with acute systolic CHF and hypertensive urgency. TTE at that time showed severe LV dsyfunction and severe LVH. Cath with no CAD which was stable on recent cath in 02/2022. Has had multiple readmissions for acute on chronic HF exacerbation as well as Afib with RVR in the setting of medication nonompliance. Last TTE 11/2022 with EF 20-25%, G1DD, mild RV dysfunction, mild MR. Cath 02/2022 with no significant CAD.  He presented on this admission with worsening SOB and edema in the setting of being out of his heart failure medications for over a month after losing his job. BNP 697. Trop 78>88. Cr 1.6 (baseline 1.3-1.5). CXR with pulmonary edema. He was started on lasix 40mg  IV and Cardiology is now consulted for further management.  Currently, the patient is grossly volume overloaded on exam and somnolent but arousable. Continues to have ongoing dyspnea.   Past Medical History:  Diagnosis Date   Heart failure with reduced ejection fraction (HCC) 2017   [facility]   Hypertension 2017   [facility]    Past Surgical History:   Procedure Laterality Date   RIGHT/LEFT HEART CATH AND CORONARY ANGIOGRAPHY N/A 02/16/2022   Procedure: RIGHT/LEFT HEART CATH AND CORONARY ANGIOGRAPHY;  Surgeon: Tonny Bollman, MD;  Location: [facility] INVASIVE CV LAB;  Service: Cardiovascular;  Laterality: N/A;     Home Medications:  Prior to Admission medications   Medication Sig Start Date End Date Taking? Authorizing Provider  empagliflozin (JARDIANCE) 10 MG TABS tablet Take 1 tablet (10 mg total) by mouth daily. 11/17/22   Merrilyn Puma, MD  furosemide (LASIX) 40 MG tablet Take 1.5 tablets (60 mg total) by mouth daily. 11/17/22   Merrilyn Puma, MD  metFORMIN (GLUCOPHAGE) 1000 MG tablet Take 1 tablet (1,000 mg total) by mouth 2 (two) times daily with a meal. 11/17/22   Merrilyn Puma, MD  metoprolol succinate (TOPROL-XL) 50 MG 24 hr tablet Take 1 tablet (50 mg total) by mouth daily. Take with or immediately following a meal. 11/17/22   Merrilyn Puma, MD  rivaroxaban (XARELTO) 20 MG TABS tablet Take 1 tablet (20 mg total) by mouth daily with supper. 11/17/22   Merrilyn Puma, MD  rosuvastatin (CRESTOR) 10 MG tablet Take 1 tablet (10 mg total) by mouth daily. 11/17/22 12/17/22  Merrilyn Puma, MD  sacubitril-valsartan (ENTRESTO) 97-103 MG Take 1 tablet by mouth 2 (two) times daily. 11/17/22   Merrilyn Puma, MD  spironolactone (ALDACTONE) 25 MG tablet Take 1/2 tablet (12.5 mg total) by mouth daily. 11/17/22 12/17/22  Merrilyn Puma, MD    Inpatient Medications: Scheduled Meds:  empagliflozin  10 mg Oral Daily   folic acid  1 mg Oral Daily   furosemide  80 mg Intravenous BID   insulin aspart  0-15 Units Subcutaneous TID WC   insulin aspart  0-5 Units Subcutaneous QHS   multivitamin with minerals  1 tablet Oral Daily   rivaroxaban  20 mg Oral Q supper   rosuvastatin  10 mg Oral Daily   sodium chloride flush  3 mL Intravenous Q12H   thiamine  100 mg Oral Daily   Or   thiamine  100 mg Intravenous Daily   Continuous Infusions:  sodium chloride     PRN  Meds: sodium chloride, acetaminophen, LORazepam **OR** LORazepam, ondansetron (ZOFRAN) IV, sodium chloride flush  Allergies:   No Known Allergies  Social History:   Social History   Socioeconomic History   Marital status: Single    Spouse name: Not on file   Number of children: 3   Years of education: Not on file   Highest education level: 7th grade  Occupational History   Occupation: Education administrator    Comment: varies  Tobacco Use   Smoking status: Never   Smokeless tobacco: Never  Vaping Use   Vaping Use: Never used  Substance and Sexual Activity   Alcohol use: Yes    Alcohol/week: 30.0 standard drinks of alcohol    Types: 30 Cans of beer per week    Comment: occ   Drug use: Not Currently    Types: "Crack" cocaine, Cocaine    Comment: 2 weeks   Sexual activity: Not on file  Other Topics Concern   Not on file  Social History Narrative   Not on file   Social Determinants of Health   Financial Resource Strain: Medium Risk (06/05/2022)   Overall Financial Resource Strain (CARDIA)    Difficulty of Paying Living Expenses: Somewhat hard  Food Insecurity: No Food Insecurity (03/10/2023)   Hunger Vital Sign    Worried About Running Out of Food in the Last Year: Never true    Ran Out of Food in the Last Year: Never true  Transportation Needs: Unmet Transportation Needs (03/10/2023)   PRAPARE - Administrator, Civil Service (Medical): Yes    Lack of Transportation (Non-Medical): Yes  Physical Activity: Not on file  Stress: Not on file  Social Connections: Not on file  Intimate Partner Violence: Not At Risk (03/10/2023)   Humiliation, Afraid, Rape, and Kick questionnaire    Fear of Current or Ex-Partner: No    Emotionally Abused: No    Physically Abused: No    Sexually Abused: No    Family History:    Family History  Problem Relation Age of Onset   Hypertension Father      ROS:  Please see the history of present illness.  All other ROS reviewed and negative.      Physical Exam/Data:   Vitals:   03/10/23 1315 03/10/23 1330 03/10/23 1339 03/10/23 1401  BP: 124/84 100/87  110/72  Pulse: 87 90  88  Resp: (!) 22 (!) 29  18  Temp:   97.9 F (36.6 C) 97.7 F (36.5 C)  TempSrc:   Oral Oral  SpO2: 94% 95%  95%  Weight:      Height:        Intake/Output Summary (Last 24 hours) at 03/10/2023 1547 Last data filed at 03/10/2023 1339 Gross per 24 hour  Intake --  Output 1000 ml  Net -1000 ml      03/10/2023    9:05 AM 11/19/2022  4:09 AM 11/18/2022    5:00 AM  Last 3 Weights  Weight (lbs) 254 lb 254 lb 10.1 oz 254 lb 10.1 oz  Weight (kg) 115.214 kg 115.5 kg 115.5 kg     Body mass index is 41 kg/m.  General:  Dyspneic, somnolent but arousable to voice HEENT: normal Neck: JVD difficult to assess due to body habitus Vascular: No carotid bruits; Distal pulses 2+ bilaterally Cardiac:  Tachycardic, regular, no murmurs Lungs:  Crackles halfway up the lung fields Abd: Obese, soft, NTTP  Ext: 2+ edema to the hips Musculoskeletal:  No deformities, BUE and BLE strength normal and equal Skin: warm and dry  Neuro:  Somnolent but arousable to voice. Grossly nonfocal Psych:  Normal affect   EKG:  The EKG was personally reviewed and demonstrates:  Stach with lateral q-waves Telemetry:  Telemetry was personally reviewed and demonstrates:  NSR/STach  Relevant CV Studies: Cardiac Studies & Procedures   CARDIAC CATHETERIZATION  CARDIAC CATHETERIZATION 02/16/2022  Narrative Patent coronary arteries with minimal luminal irregularity Low cardiac filling pressures (PCWP mean of 3 mmHg) with borderline cardiac output (CO 4.2 L/m and CI 2.0)  Recommend: medical therapy, would decrease or stop diuretics defer to cardiology rounding team  Findings Coronary Findings Diagnostic  Dominance: Right  Left Main Vessel is large. Vessel is angiographically normal.  Left Anterior Descending Vessel is moderate in size. The vessel exhibits minimal luminal  irregularities. Minimal LAD irregularity without significant stenosis  Ramus Intermedius Vessel is moderate in size. There is mild diffuse disease throughout the vessel.  Left Circumflex Vessel is large. The vessel exhibits minimal luminal irregularities. Large vessel, supplies 2 large OM branches. Minimal irregularity noted without significant stenosis.  Right Coronary Artery Vessel is large. The vessel exhibits minimal luminal irregularities. Large, dominant vessel with minimal irregularity  Intervention  No interventions have been documented.     ECHOCARDIOGRAM  ECHOCARDIOGRAM COMPLETE 11/17/2022  Narrative ECHOCARDIOGRAM REPORT    Patient Name:   Justin [name] Date of Exam: 11/17/2022 Medical Rec #:  [id number]              Height:       66.0 in Accession #:    [id number]             Weight:       256.8 lb Date of Birth:  [birth year]              BSA:          2.224 m Patient Age:    41 years               BP:           124/91 mmHg Patient Gender: M                      HR:           100 bpm. Exam Location:  Inpatient  Procedure: 2D Echo, Cardiac Doppler, Color Doppler and Intracardiac Opacification Agent  Indications:     CHF-I50.9  History:         Patient has prior history of Echocardiogram examinations, most recent 06/02/2022. CHF and Cardiomyopathy, Arrythmias:Atrial Fibrillation and Tachycardia, Signs/Symptoms:Dyspnea; Risk Factors:Hypertension and Diabetes. CKD, stage 2.  Sonographer:     Lucendia Herrlich Referring Phys:  [id number] JULIE MACHEN Diagnosing Phys: Wilfred Lacy  IMPRESSIONS   1. Left ventricular ejection fraction, by estimation, is 20 to 25%. The left ventricle has severely decreased function. The left  ventricle demonstrates global hypokinesis. The left ventricular internal cavity size was mildly dilated. There is mild asymmetric left ventricular hypertrophy of the basal-septal segment. Left ventricular diastolic parameters are consistent  with Grade I diastolic dysfunction (impaired relaxation). No LV thrombus. 2. Right ventricular systolic function is mildly reduced. The right ventricular size is mildly enlarged. Tricuspid regurgitation signal is inadequate for assessing PA pressure. 3. Left atrial size was moderately dilated. 4. Right atrial size was mildly dilated. 5. The mitral valve is normal in structure. Mild mitral valve regurgitation. No evidence of mitral stenosis. 6. The aortic valve is tricuspid. Aortic valve regurgitation is not visualized. No aortic stenosis is present. 7. The inferior vena cava is normal in size with greater than 50% respiratory variability, suggesting right atrial pressure of 3 mmHg.  FINDINGS Left Ventricle: Left ventricular ejection fraction, by estimation, is 20 to 25%. The left ventricle has severely decreased function. The left ventricle demonstrates global hypokinesis. Definity contrast agent was given IV to delineate the left ventricular endocardial borders. The left ventricular internal cavity size was mildly dilated. There is mild asymmetric left ventricular hypertrophy of the basal-septal segment. Left ventricular diastolic parameters are consistent with Grade I diastolic dysfunction (impaired relaxation).  Right Ventricle: The right ventricular size is mildly enlarged. No increase in right ventricular wall thickness. Right ventricular systolic function is mildly reduced. Tricuspid regurgitation signal is inadequate for assessing PA pressure.  Left Atrium: Left atrial size was moderately dilated.  Right Atrium: Right atrial size was mildly dilated.  Pericardium: Trivial pericardial effusion is present.  Mitral Valve: The mitral valve is normal in structure. Mild mitral annular calcification. Mild mitral valve regurgitation. No evidence of mitral valve stenosis.  Tricuspid Valve: The tricuspid valve is normal in structure. Tricuspid valve regurgitation is not demonstrated.  Aortic  Valve: The aortic valve is tricuspid. Aortic valve regurgitation is not visualized. No aortic stenosis is present. Aortic valve mean gradient measures 2.0 mmHg. Aortic valve peak gradient measures 4.4 mmHg. Aortic valve area, by VTI measures 2.96 cm.  Pulmonic Valve: The pulmonic valve was normal in structure. Pulmonic valve regurgitation is not visualized.  Aorta: The aortic root is normal in size and structure.  Venous: The inferior vena cava is normal in size with greater than 50% respiratory variability, suggesting right atrial pressure of 3 mmHg.  IAS/Shunts: No atrial level shunt detected by color flow Doppler.   LEFT VENTRICLE PLAX 2D LVIDd:         6.10 cm      Diastology LVIDs:         5.00 cm      LV e' medial:    4.30 cm/s LV PW:         1.70 cm      LV E/e' medial:  12.7 LV IVS:        1.40 cm      LV e' lateral:   3.81 cm/s LVOT diam:     2.20 cm      LV E/e' lateral: 14.4 LV SV:         39 LV SV Index:   18 LVOT Area:     3.80 cm  LV Volumes (MOD) LV vol d, MOD A2C: 183.0 ml LV vol d, MOD A4C: 232.0 ml LV vol s, MOD A2C: 151.0 ml LV vol s, MOD A4C: 192.0 ml LV SV MOD A2C:     32.0 ml LV SV MOD A4C:     232.0 ml LV SV MOD BP:  38.8 ml  RIGHT VENTRICLE             IVC RV S prime:     10.50 cm/s  IVC diam: 1.70 cm TAPSE (M-mode): 2.2 cm  LEFT ATRIUM             Index        RIGHT ATRIUM           Index LA diam:        6.00 cm 2.70 cm/m   RA Area:     23.00 cm LA Vol (A2C):   89.0 ml 40.02 ml/m  RA Volume:   68.00 ml  30.57 ml/m LA Vol (A4C):   89.1 ml 40.06 ml/m LA Biplane Vol: 94.3 ml 42.40 ml/m AORTIC VALVE AV Area (Vmax):    2.71 cm AV Area (Vmean):   2.67 cm AV Area (VTI):     2.96 cm AV Vmax:           105.00 cm/s AV Vmean:          68.500 cm/s AV VTI:            0.133 m AV Peak Grad:      4.4 mmHg AV Mean Grad:      2.0 mmHg LVOT Vmax:         74.80 cm/s LVOT Vmean:        48.040 cm/s LVOT VTI:          0.104 m LVOT/AV VTI ratio:  0.78  AORTA Ao Root diam: 3.30 cm Ao Asc diam:  3.30 cm  MITRAL VALVE MV Area (PHT): 7.99 cm    SHUNTS MV Decel Time: 95 msec     Systemic VTI:  0.10 m MV E velocity: 54.80 cm/s  Systemic Diam: 2.20 cm MV A velocity: 56.90 cm/s MV E/A ratio:  0.96  Dalton McleanMD Electronically signed by Wilfred Lacy Signature Date/Time: 11/17/2022/2:35:49 PM    Final (Updated)      CARDIAC MRI  MR CARDIAC MORPHOLOGY W WO CONTRAST 02/20/2022  Narrative CLINICAL DATA:  88M with NICM. Echo with EF 30-35%. Cath shows no obstructive CAD.  EXAM: CARDIAC MRI  TECHNIQUE: The patient was scanned on a 1.5 Tesla Siemens magnet. A dedicated cardiac coil was used. Functional imaging was done using Fiesta sequences. 2,3, and 4 chamber views were done to assess for RWMA's. Modified Simpson's rule using a short axis stack was used to calculate an ejection fraction on a dedicated work [employer]. The patient received 10 cc of Gadavist. After 10 minutes inversion recovery sequences were used to assess for infiltration and scar tissue.  CONTRAST:  10 cc  of Gadavist  FINDINGS: Left ventricle:  -Asymmetric hypertrophy measuring 17mm in basal septum (10mm in posterior wall)  -Severe dilatation  -Severe systolic dysfunction  -Nonspecific ECV elevation (31%)  -Normal myocardial T2 values  -Patchy LGE in septum and RV insertion sites, along with subepicardial LGE in inferolateral wall. LGE accounts for 12% of total myocardial mass  LV EF: 29% (Normal 56-78%)  Absolute volumes:  LV EDV: (Normal 77-195 mL)  LV ESV: (Normal 19-72 mL)  LV SV: (Normal 51-133 mL)  CO: 8.1L/min (Normal 2.8-8.8 L/min)  Indexed volumes:  LV EDV: 143mL/sq-m (Normal 47-92 mL/sq-m)  LV ESV: 137mL/sq-m (Normal 13-30 mL/sq-m)  LV SV: 57mL/sq-m (Normal 32-62 mL/sq-m)  CI: 3.9L/min/sq-m (Normal 1.7-4.2 L/min/sq-m)  Right ventricle: Normal size with mild systolic  dysfunction  RV EF:  45% (Normal 47-74%)  Absolute volumes:  RV EDV: (Normal 88-227 mL)  RV ESV: (Normal 23-103 mL)  RV SV: 87mL (Normal 52-138 mL)  CO: 7.1L/min (Normal 2.8-8.8 L/min)  Indexed volumes:  RV EDV: 29mL/sq-m (Normal 55-105 mL/sq-m)  RV ESV: 37mL/sq-m (Normal 15-43 mL/sq-m)  RV SV: 45mL/sq-m (Normal 32-64 mL/sq-m)  CI: 3.4L/min/sq-m (Normal 1.7-4.2 L/min/sq-m)  Left atrium: Moderate enlargement  Right atrium: Mild enlargement  Mitral valve: Mild regurgitation  Aortic valve: No regurgitation  Tricuspid valve: Trivial regurgitation  Pulmonic valve: No regurgitation  Aorta: Normal proximal ascending aorta  Pericardium: Small effusion  IMPRESSION: 1.  Severe LV dilatation with severe systolic dysfunction (EF 29%)  2. Asymmetric LV hypertrophy measuring 17mm in basal septum (10mm in posterior wall), meeting criteria for hypertrophic cardiomyopathy  3. Patchy LGE in septum and RV insertion sites, along with subepicardial LGE in inferolateral wall. LGE accounts for 12% of total myocardial mass. While scar pattern is consistent with HCM, the subepicardial LGE in inferolateral wall is atypical, and would also consider sarcoidosis in the differential. While suspect HCM is most likely diagnosis, sarcoid can mimick HCM MRI findings, and in setting of atypical HCM LGE pattern and mediastinal lymphadenopathy, recommend cardiac PET to evaluate for sarcoid  4.  Normal RV size with mild systolic dysfunction (EF 45%)  5.  Small pericardial effusion   Electronically Signed By: Epifanio Lesches M.D. On: 02/20/2022 23:20          Laboratory Data:  High Sensitivity Troponin:   Recent Labs  Lab 03/10/23 0918 03/10/23 1149  TROPONINIHS 78* 88*     Chemistry Recent Labs  Lab 03/10/23 0918  NA 135  K 4.3  CL 98  CO2 27  GLUCOSE 295*  BUN 19  CREATININE 1.60*  CALCIUM 8.7*  GFRNONAA 54*  ANIONGAP 10    No results for  input(s): "PROT", "ALBUMIN", "AST", "ALT", "ALKPHOS", "BILITOT" in the last 168 hours. Lipids No results for input(s): "CHOL", "TRIG", "HDL", "LABVLDL", "LDLCALC", "CHOLHDL" in the last 168 hours.  Hematology Recent Labs  Lab 03/10/23 0918  WBC 8.0  RBC 5.18  HGB 15.3  HCT 48.9  MCV 94.4  MCH 29.5  MCHC 31.3  RDW 15.1  PLT 267   Thyroid No results for input(s): "TSH", "FREET4" in the last 168 hours.  BNP Recent Labs  Lab 03/10/23 0918  BNP 697.7*    DDimer No results for input(s): "DDIMER" in the last 168 hours.   Radiology/Studies:  DG Chest 2 View  Result Date: 03/10/2023 CLINICAL DATA:  42 year old male with history of shortness of breath and left leg swelling. EXAM: CHEST - 2 VIEW COMPARISON:  Chest x-ray 11/16/2022. FINDINGS: There is cephalization of the pulmonary vasculature, indistinctness of the interstitial markings, and patchy airspace disease throughout the lungs bilaterally suggestive of moderate pulmonary edema. Small bilateral pleural effusions. Moderate cardiomegaly. Upper mediastinal contours are within normal limits. IMPRESSION: 1. The appearance of the chest suggests moderate to severe congestive heart failure, as above. Electronically Signed   By: Trudie Reed M.D.   On: 03/10/2023 10:08     Assessment and Plan:   #Acute on chronic combined systolic and diastolic CHF: -Initially diagnosed in 2017 with EF severely reduced in setting of uncontrolled HTN -Echo 02/09/22: EF 30-35%, RV okay, mild to moderate MR. -R/LHC 02/16/22: No significant CAD, RA mean 1, PA mean 19, PCWP 3, LVEDP 7, Fick CO/CI 4.17/1.99 -cMRI 02/20/22: LVEF 29%, LV severely dilated, asymmetric LVH with basal septum measuring 17 mm, RVEF 45%,  patchy LGE in setpum and RV insertion sites + subepicardial LGE in inferolateral wall. Scar pattern felt to be consistent with HCM. However, d/t subepicardial LGE in inferolateral wall and presence of mediastinal lymphadenopathy, cardiac PET  recommended to r/o sarcoid, small pericardial effusion.  - Echo 9/23: EF 15-20%, GIIIDD, RV mildly dilated and mod reduced function, Mod MR, trivial TR - Echo 11/2022 EF 20-25%, mild RV dysfunction, mild MR -CM most likely due to HCM from uncontrolled HTN in setting of noncompliance, ETOH abuse, cocaine abuse  - Presents on this admission with gross volume overload in the setting of medication noncompliance (has been out of meds for 1 month after losing his job) - Will increase lasix to 80mg  IV BID - Hold metop for now given degree of volume overload - Continue Jardiance 10mg  daily  - Will add back spiro/entresto pending renal function - Strict I&O, daily weights   #Afib with RVR - Maintaining NSR currently - Started on xarelto for [facility]; Compliance is a major issue and likely not a good warfarin candidate. Favor xarelto due to daily dosing over BID with apixaban. Lack of insurance is also an issue   #Uncontrolled HTN: - Elevated on admission and appears he received one dose of clonidine and BP now 100-110 - Will resume home GDMT as able   #Polysubstance abuse: -ETOH and crack/cocaine -Cessation discussed -Per primary   #DM II: - Management per primary   #CKD IIIA: - Scr averaging 1.3-1.5; bumped; at 1.6 currently - Diuresis as above   #Hyperlipidemia - Continue crestor 10mg  daily       New York Heart Association (NYHA) Functional Class NYHA Class IV  CHA2DS2-VASc Score = 4  } This indicates a 4.8% annual risk of stroke. The patient's score is based upon: CHF History: 1 HTN History: 1 Diabetes History: 1 Stroke History: 0 Vascular Disease History: 1 Age Score: 0 Gender Score: 0         For questions or updates, please contact [facility] Please consult [email or web address] for contact info under    Signed, Meriam Sprague, MD  03/10/2023 3:47 PM

## 2023-03-10 NOTE — ED Notes (Signed)
Courtesy called completed to 3E prior to transport

## 2023-03-10 NOTE — ED Notes (Signed)
ED TO INPATIENT HANDOFF REPORT  ED Nurse Name and Phone #: Angelica Chessman, [place]  S Name/Age/Gender Justin [name] 42 y.o. male Room/Bed: 032C/032C  Code Status   Code Status: Full Code  Home/SNF/Other Home Patient oriented to: self, place, time, and situation Is this baseline? Yes   Triage Complete: Triage complete  Chief Complaint CHF (congestive heart failure) (HCC) [I50.9]  Triage Note Pt c/o increasing SOB and BLE swelling x1 week.  Denies pain.  Pt reports running out of medication x1 month ago.   Significant swelling noted to BLEs. Pt's O2 initally 78% RA.  Pt increased to 92% on 4L [place].  Interpreter: Crissie Reese [phone]    Allergies No Known Allergies  Level of Care/Admitting Diagnosis ED Disposition     ED Disposition  Admit   Condition  --   Comment  Hospital Area: [facility] [[id number]]  Level of Care: Telemetry Cardiac [[id number]]  May admit patient to [facility] or [facility] if equivalent level of care is available:: Yes  Covid Evaluation: Asymptomatic - no recent exposure (last 10 days) testing not required  Diagnosis: CHF (congestive heart failure) [facility]) [[id number]]  Admitting Physician: Alessandra Bevels [[id number]]  Attending Physician: Alessandra Bevels [phone]  Certification:: I certify this patient will need inpatient services for at least 2 midnights  Estimated Length of Stay: 3          B Medical/Surgery History Past Medical History:  Diagnosis Date   Heart failure with reduced ejection fraction (HCC) 2017   [facility]   Hypertension 2017   [facility]   Past Surgical History:  Procedure Laterality Date   RIGHT/LEFT HEART CATH AND CORONARY ANGIOGRAPHY N/A 02/16/2022   Procedure: RIGHT/LEFT HEART CATH AND CORONARY ANGIOGRAPHY;  Surgeon: Tonny Bollman, MD;  Location: [facility] INVASIVE CV LAB;  Service: Cardiovascular;  Laterality: N/A;     A IV Location/Drains/Wounds Patient Lines/Drains/Airways  Status     Active Line/Drains/Airways     Name Placement date Placement time Site Days   Peripheral IV 03/10/23 20 G 1" Anterior;Distal;Right;Upper Arm 03/10/23  0947  Arm  less than 1            Intake/Output Last 24 hours No intake or output data in the 24 hours ending 03/10/23 1256  Labs/Imaging Results for orders placed or performed during the hospital encounter of 03/10/23 (from the past 48 hour(s))  Basic metabolic panel     Status: Abnormal   Collection Time: 03/10/23  9:18 AM  Result Value Ref Range   Sodium 135 135 - 145 mmol/L   Potassium 4.3 3.5 - 5.1 mmol/L   Chloride 98 98 - 111 mmol/L   CO2 27 22 - 32 mmol/L   Glucose, Bld 295 (H) 70 - 99 mg/dL    Comment: Glucose reference range applies only to samples taken after fasting for at least 8 hours.   BUN 19 6 - 20 mg/dL   Creatinine, Ser 3.08 (H) 0.61 - 1.24 mg/dL   Calcium 8.7 (L) 8.9 - 10.3 mg/dL   GFR, Estimated 54 (L) >60 mL/min    Comment: (NOTE) Calculated using the CKD-EPI Creatinine Equation (2021)    Anion gap 10 5 - 15    Comment: Performed at [facility], [address], [place], [place] [address]  CBC with Differential     Status: None   Collection Time: 03/10/23  9:18 AM  Result Value Ref Range   WBC 8.0 4.0 - 10.5  K/uL   RBC 5.18 4.22 - 5.81 MIL/uL   Hemoglobin 15.3 13.0 - 17.0 g/dL   HCT 16.1 09.6 - 04.5 %   MCV 94.4 80.0 - 100.0 fL   MCH 29.5 26.0 - 34.0 pg   MCHC 31.3 30.0 - 36.0 g/dL   RDW 40.9 81.1 - 91.4 %   Platelets 267 150 - 400 K/uL   nRBC 0.0 0.0 - 0.2 %   Neutrophils Relative % 76 %   Neutro Abs 6.1 1.7 - 7.7 K/uL   Lymphocytes Relative 13 %   Lymphs Abs 1.0 0.7 - 4.0 K/uL   Monocytes Relative 7 %   Monocytes Absolute 0.6 0.1 - 1.0 K/uL   Eosinophils Relative 3 %   Eosinophils Absolute 0.2 0.0 - 0.5 K/uL   Basophils Relative 1 %   Basophils Absolute 0.0 0.0 - 0.1 K/uL   Immature Granulocytes 0 %   Abs Immature Granulocytes 0.03 0.00 - 0.07 K/uL    Comment:  Performed at [facility], [address], [place], [place] [address]  Brain natriuretic peptide     Status: Abnormal   Collection Time: 03/10/23  9:18 AM  Result Value Ref Range   B Natriuretic Peptide 697.7 (H) 0.0 - 100.0 pg/mL    Comment: Performed at [facility], [address], [place], [place] [address]  Troponin I (High Sensitivity)     Status: Abnormal   Collection Time: 03/10/23  9:18 AM  Result Value Ref Range   Troponin I (High Sensitivity) 78 (H) <18 ng/L    Comment: (NOTE) Elevated high sensitivity troponin I (hsTnI) values and significant  changes across serial measurements may suggest ACS but many other  chronic and acute conditions are known to elevate hsTnI results.  Refer to the "Links" section for chest pain algorithms and additional  guidance. Performed at [facility], [address] [place], [place] [address]   Troponin I (High Sensitivity)     Status: Abnormal   Collection Time: 03/10/23 11:49 AM  Result Value Ref Range   Troponin I (High Sensitivity) 88 (H) <18 ng/L    Comment: (NOTE) Elevated high sensitivity troponin I (hsTnI) values and significant  changes across serial measurements may suggest ACS but many other  chronic and acute conditions are known to elevate hsTnI results.  Refer to the "Links" section for chest pain algorithms and additional  guidance. Performed at [facility], [address] [place], [place] [address]    DG Chest 2 View  Result Date: 03/10/2023 CLINICAL DATA:  42 year old male with history of shortness of breath and left leg swelling. EXAM: CHEST - 2 VIEW COMPARISON:  Chest x-ray 11/16/2022. FINDINGS: There is cephalization of the pulmonary vasculature, indistinctness of the interstitial markings, and patchy airspace disease throughout the lungs bilaterally suggestive of moderate pulmonary edema. Small bilateral pleural effusions. Moderate cardiomegaly. Upper mediastinal contours are within normal  limits. IMPRESSION: 1. The appearance of the chest suggests moderate to severe congestive heart failure, as above. Electronically Signed   By: Trudie Reed M.D.   On: 03/10/2023 10:08    Pending Labs Unresulted Labs (From admission, onward)     Start     Ordered   03/11/23 0500  Basic metabolic panel  Daily,   R     Comments: As Scheduled for 5 days    03/10/23 1247            Vitals/Pain Today's Vitals   03/10/23 0905  03/10/23 0906 03/10/23 0907 03/10/23 1230  BP: (!) 161/132 (!) 166/141  (!) 127/96  Pulse: (!) 116 (!) 114 (!) 114 (!) 106  Resp: (!) 35 (!) 28 (!) 34 (!) 22  Temp:  99.4 F (37.4 C)    TempSrc:  Oral    SpO2: (!) 74% 90% 91% 94%  Weight: 115.2 kg     Height: 5\' 6"  (1.676 m)     PainSc: 0-No pain       Isolation Precautions No active isolations  Medications Medications  metoprolol succinate (TOPROL-XL) 24 hr tablet 50 mg (50 mg Oral Given 03/10/23 0948)  rosuvastatin (CRESTOR) tablet 10 mg (has no administration in time range)  empagliflozin (JARDIANCE) tablet 10 mg (has no administration in time range)  rivaroxaban (XARELTO) tablet 20 mg (has no administration in time range)  sodium chloride flush (NS) 0.9 % injection 3 mL (has no administration in time range)  sodium chloride flush (NS) 0.9 % injection 3 mL (has no administration in time range)  0.9 %  sodium chloride infusion (has no administration in time range)  acetaminophen (TYLENOL) tablet 650 mg (has no administration in time range)  ondansetron (ZOFRAN) injection 4 mg (has no administration in time range)  furosemide (LASIX) injection 40 mg (has no administration in time range)  acetaminophen (TYLENOL) tablet 1,000 mg (1,000 mg Oral Given 03/10/23 0948)  furosemide (LASIX) injection 40 mg (40 mg Intravenous Given 03/10/23 0948)  cloNIDine (CATAPRES) tablet 0.2 mg (0.2 mg Oral Given 03/10/23 0948)    Mobility walks     Focused Assessments Cardiac Assessment Handoff:    No results  found for: "CKTOTAL", "CKMB", "CKMBINDEX", "TROPONINI" No results found for: "DDIMER" Does the Patient currently have chest pain? No   , Pulmonary Assessment Handoff:  Lung sounds: L Breath Sounds: Diminished R Breath Sounds: Diminished O2 Device: Nasal Cannula O2 Flow Rate (L/min): 4 L/min    R Recommendations: See Admitting Provider Note  Report given to:   Additional Notes: PT AOX4, has been off his meds for a month, BLE edema, takes meds whole, needs interpreter, able to communicate in English a little

## 2023-03-11 ENCOUNTER — Inpatient Hospital Stay (HOSPITAL_COMMUNITY): Payer: Self-pay

## 2023-03-11 DIAGNOSIS — N189 Chronic kidney disease, unspecified: Secondary | ICD-10-CM

## 2023-03-11 DIAGNOSIS — I422 Other hypertrophic cardiomyopathy: Secondary | ICD-10-CM

## 2023-03-11 DIAGNOSIS — I5023 Acute on chronic systolic (congestive) heart failure: Secondary | ICD-10-CM

## 2023-03-11 DIAGNOSIS — I5021 Acute systolic (congestive) heart failure: Secondary | ICD-10-CM

## 2023-03-11 DIAGNOSIS — E7849 Other hyperlipidemia: Secondary | ICD-10-CM

## 2023-03-11 DIAGNOSIS — I4891 Unspecified atrial fibrillation: Secondary | ICD-10-CM

## 2023-03-11 DIAGNOSIS — N179 Acute kidney failure, unspecified: Secondary | ICD-10-CM

## 2023-03-11 LAB — ECHOCARDIOGRAM LIMITED
Area-P 1/2: 5.13 cm2
Height: 66 in
S' Lateral: 5.4 cm
Weight: 4638.48 oz

## 2023-03-11 LAB — GLUCOSE, CAPILLARY
Glucose-Capillary: 291 mg/dL — ABNORMAL HIGH (ref 70–99)
Glucose-Capillary: 208 mg/dL — ABNORMAL HIGH (ref 70–99)
Glucose-Capillary: 118 mg/dL — ABNORMAL HIGH (ref 70–99)
Glucose-Capillary: 175 mg/dL — ABNORMAL HIGH (ref 70–99)

## 2023-03-11 LAB — LIPID PANEL
Cholesterol: 221 mg/dL — ABNORMAL HIGH (ref 0–200)
HDL: 36 mg/dL — ABNORMAL LOW (ref >40)
LDL Cholesterol: 137 mg/dL — ABNORMAL HIGH (ref 0–99)
Total CHOL/HDL Ratio: 6.1 RATIO
Triglycerides: 239 mg/dL — ABNORMAL HIGH (ref <150)
VLDL: 48 mg/dL — ABNORMAL HIGH (ref 0–40)

## 2023-03-11 LAB — BASIC METABOLIC PANEL
Anion gap: 16 — ABNORMAL HIGH (ref 5–15)
BUN: 22 mg/dL — ABNORMAL HIGH (ref 6–20)
CO2: 30 mmol/L (ref 22–32)
Calcium: 9.3 mg/dL (ref 8.9–10.3)
Chloride: 94 mmol/L — ABNORMAL LOW (ref 98–111)
Creatinine, Ser: 1.39 mg/dL — ABNORMAL HIGH (ref 0.61–1.24)
GFR, Estimated: >60 mL/min (ref >60)
Glucose, Bld: 185 mg/dL — ABNORMAL HIGH (ref 70–99)
Potassium: 4.2 mmol/L (ref 3.5–5.1)
Sodium: 140 mmol/L (ref 135–145)

## 2023-03-11 MED ORDER — SACUBITRIL-VALSARTAN 24-26 MG PO TABS
1.0000 | ORAL_TABLET | Freq: Two times a day (BID) | ORAL | Status: DC
  Administered 2023-03-11 – 2023-03-13 (×5): 1 via ORAL
  Filled 2023-03-11 (×5): qty 1

## 2023-03-11 MED ORDER — PERFLUTREN LIPID MICROSPHERE
1.0000 mL | INTRAVENOUS | Status: AC | PRN
  Administered 2023-03-11: 4 mL via INTRAVENOUS

## 2023-03-11 NOTE — Progress Notes (Signed)
Progress Note   Patient: Justin [name] YNW:[id number] DOB: [birth year] DOA: 03/10/2023     1 DOS: the patient was seen and examined on 03/11/2023   Brief hospital course: [name] was admitted to the hospital with the working diagnosis of heart failure exacerbation.   42 yo male with the past medical history of hypertension, heart failure, and obesity who presented with dyspnea and edema. Reported one week of worsening edema, starting in his legs and progressing into his abdomen. Positive orthopnea and PND. He has been of heart medications including diuretics for the last month. On his initial physical examination his blood pressure was 100/87, HR 87 to 116, RR 29 and 02 saturation 95% on 4 L/min per [place]. Lungs with no wheezing or rales, positive increased work of breathing, heart with S1 and S2 present and rhythmic, no gallops, abdomen with no distention and positive lower extremity edema +++.   Chest radiograph with cardiomegaly, with bilateral hilar vascular congestion, bilateral central interstitial infiltrates, small bilateral pleural effusions.  EKG 113 bpm, right axis deviation, normal intervals, sinus rhythm with J point elevation V3 to V4, with poor R R wave progression, no significant T wave changes. Positive LVH.     Assessment and Plan: Acute on chronic systolic CHF (congestive heart failure) (HCC) Echocardiogram with reduced LV systolic function with EF 20 to 25%, global hypokinesis, mild enlarged cavity, moderate LVH, RV systolic function with moderate reduction, RV with moderate enlargement, no significant valvular disease.   Urine output 6,850 ml Systolic blood pressure 118 to 125 mmHg.   Plan to continue diuresis with furosemide 80 mg IV q12 hrs Continue entresto and empagliflozin.   Paroxysmal atrial fibrillation with RVR (HCC) Patient on sinus rhythm with controlled heart rate.  Plan to continue anticoagulation with rivaroxaban. Continue telemetry  monitoring.   Essential hypertension Continue blood pressure control with entresto.   Acute kidney injury superimposed on chronic kidney disease (HCC) CKD stage 3 with base cr 1,5.   Renal function with serum cr at 1,39 with K at 4,2 and serum bicarbonate at 30. Na 140   Continue diuresis, and follow up electrolytes in am.   Type 2 diabetes mellitus with hyperlipidemia (HCC) Continue glucose cover and monitoring with insulin sliding scale.   Continue with statin therapy.  Alcohol abuse No signs of acute withdrawal, will discontinue CIWA benzodiazepine protocol. Continue neuro checks.   Class 3 obesity (HCC) Calculated BMI of 46.7         Subjective: Patient somnolent at the time of my examination, reports dyspnea and edema improving.   Physical Exam: Vitals:   03/11/23 0500 03/11/23 0750 03/11/23 0800 03/11/23 1110  BP:  125/79  118/75  Pulse:  77  75  Resp:  20  18  Temp:  97.8 F (36.6 C)  97.8 F (36.6 C)  TempSrc:  Oral  Oral  SpO2:  (!) 89% 92% 91%  Weight: 131.5 kg     Height:       Neurology somnolent but easy to arouse, follows commands and responds to questions ENT with mild pallor Cardiovascular with S1 and S2 present and rhythmic with no gallops, rubs or murmurs No JVD (wide neck) Positive lower extremity edema ++ Respiratory with no rales or wheezing, no rhonchi Abdomen with no distention  Data Reviewed:    Family Communication: no family at the bedside   Disposition: Status is: Inpatient Remains inpatient appropriate because: heart failure   Planned Discharge Destination: Home  Author: Coralie Keens, MD 03/11/2023 1:13 PM  For on call review [email or web address].

## 2023-03-11 NOTE — Assessment & Plan Note (Signed)
CKD stage 3a with base cr 1,5.  Hyponatremia and Hypomagnesemia.   At the time of his discharge his renal function had a serum cr of 1,67 with K at 3,6 and serum bicarbonate at 36.  Na 134 and Mg 2,6   Diuresis with furosemide, spironolactone, and SGLT 2 inh

## 2023-03-11 NOTE — Assessment & Plan Note (Signed)
Patient on sinus rhythm with controlled heart rate.  Plan to continue anticoagulation with rivaroxaban. Continue telemetry monitoring.

## 2023-03-11 NOTE — Assessment & Plan Note (Addendum)
Echocardiogram with reduced LV systolic function with EF 20 to 25%, global hypokinesis, mild enlarged cavity, moderate LVH, RV systolic function with moderate reduction, RV with moderate enlargement, no significant valvular disease.   Urine output 6,850 ml Systolic blood pressure 118 to 125 mmHg.   Plan to continue diuresis with furosemide 80 mg IV q12 hrs Continue entresto and empagliflozin.

## 2023-03-11 NOTE — Assessment & Plan Note (Addendum)
His glucose remained stable, he was placed on insulin sliding scale during his hospitalization.   Continue with statin therapy.

## 2023-03-11 NOTE — Assessment & Plan Note (Signed)
Continue blood pressure control with entresto.  

## 2023-03-11 NOTE — Assessment & Plan Note (Signed)
No signs of acute withdrawal.  

## 2023-03-11 NOTE — Hospital Course (Signed)
Mr. [name] was admitted to the hospital with the working diagnosis of heart failure exacerbation.   42 yo male with the past medical history of hypertension, heart failure, and obesity who presented with dyspnea and edema. Reported one week of worsening edema, starting in his legs and progressing into his abdomen. Positive orthopnea and PND. He has been of heart medications including diuretics for the last month. On his initial physical examination his blood pressure was 100/87, HR 87 to 116, RR 29 and 02 saturation 95% on 4 L/min per [place]. Lungs with no wheezing or rales, positive increased work of breathing, heart with S1 and S2 present and rhythmic, no gallops, abdomen with no distention and positive lower extremity edema +++.   Chest radiograph with cardiomegaly, with bilateral hilar vascular congestion, bilateral central interstitial infiltrates, small bilateral pleural effusions.  EKG 113 bpm, right axis deviation, normal intervals, sinus rhythm with J point elevation V3 to V4, with poor R R wave progression, no significant T wave changes. Positive LVH.   07/01 patient responding well to diuresis.

## 2023-03-11 NOTE — Progress Notes (Signed)
Progress Note  Patient Name: Justin [name] Date of Encounter: 03/11/2023  Primary Cardiologist: Jodelle Red, MD  Subjective   No acute events overnight.  SOB better compared to yesterday.  6.8 L urine output in last 24 hours with net -5.3 L.  Spanish translator services used.  Inpatient Medications    Scheduled Meds:  empagliflozin  10 mg Oral Daily   folic acid  1 mg Oral Daily   furosemide  80 mg Intravenous BID   insulin aspart  0-15 Units Subcutaneous TID WC   insulin aspart  0-5 Units Subcutaneous QHS   multivitamin with minerals  1 tablet Oral Daily   rivaroxaban  20 mg Oral Q supper   rosuvastatin  10 mg Oral Daily   sodium chloride flush  3 mL Intravenous Q12H   thiamine  100 mg Oral Daily   Or   thiamine  100 mg Intravenous Daily   Continuous Infusions:  sodium chloride     PRN Meds: sodium chloride, acetaminophen, LORazepam **OR** LORazepam, ondansetron (ZOFRAN) IV, sodium chloride flush   Vital Signs    Vitals:   03/11/23 0300 03/11/23 0400 03/11/23 0500 03/11/23 0750  BP: 97/61   125/79  Pulse: 97   77  Resp: 20   20  Temp: 97.7 F (36.5 C)   97.8 F (36.6 C)  TempSrc: Axillary   Oral  SpO2: 90% 92%  (!) 89%  Weight:   131.5 kg   Height:        Intake/Output Summary (Last 24 hours) at 03/11/2023 0845 Last data filed at 03/11/2023 0600 Gross per 24 hour  Intake 1520 ml  Output 6850 ml  Net -5330 ml   Filed Weights   03/10/23 0905 03/11/23 0500  Weight: 115.2 kg 131.5 kg    Telemetry     Personally reviewed, NSR  ECG    Not performed today  Physical Exam   GEN: No acute distress.   Neck: No JVD. Cardiac: RRR, no murmur, rub, or gallop.  Respiratory: Nonlabored. Clear to auscultation bilaterally. GI: Soft, nontender, bowel sounds present. MS: No edema; No deformity. Neuro:  Nonfocal. Psych: Alert and oriented x 3. Normal affect.  Labs    Chemistry Recent Labs  Lab 03/10/23 0918 03/11/23 0109   NA 135 140  K 4.3 4.2  CL 98 94*  CO2 27 30  GLUCOSE 295* 185*  BUN 19 22*  CREATININE 1.60* 1.39*  CALCIUM 8.7* 9.3  GFRNONAA 54* >60  ANIONGAP 10 16*     Hematology Recent Labs  Lab 03/10/23 0918  WBC 8.0  RBC 5.18  HGB 15.3  HCT 48.9  MCV 94.4  MCH 29.5  MCHC 31.3  RDW 15.1  PLT 267    Cardiac Enzymes Recent Labs  Lab 03/10/23 0918 03/10/23 1149  TROPONINIHS 78* 88*    BNP Recent Labs  Lab 03/10/23 0918  BNP 697.7*     DDimerNo results for input(s): "DDIMER" in the last 168 hours.   Radiology    DG Chest 2 View  Result Date: 03/10/2023 CLINICAL DATA:  43 year old male with history of shortness of breath and left leg swelling. EXAM: CHEST - 2 VIEW COMPARISON:  Chest x-ray 11/16/2022. FINDINGS: There is cephalization of the pulmonary vasculature, indistinctness of the interstitial markings, and patchy airspace disease throughout the lungs bilaterally suggestive of moderate pulmonary edema. Small bilateral pleural effusions. Moderate cardiomegaly. Upper mediastinal contours are within normal limits. IMPRESSION: 1. The appearance of the chest suggests moderate  to severe congestive heart failure, as above. Electronically Signed   By: Trudie Reed M.D.   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  # A-fib with RVR, currently rate controlled -Telemetry reviewed, NSR and rate  controlled -Continue Xarelto 20 mg daily with dinner, compliance and insurance of the issues.  # Polysubstance abuse -U tox negative, but history of alcohol and cocaine use  # HLD # Hypertriglyceridemia -Lipid panel on admission showed LDL 137 and TG 239, both elevated.  Continue Crestor 10 mg nightly, outpatient management  # HTN, currently controlled # DM2, management per primary    Signed, Remedios Mckone P Emersyn Wyss, MD  03/11/2023, 8:45 AM

## 2023-03-11 NOTE — Assessment & Plan Note (Signed)
Calculated BMI of 46.7

## 2023-03-12 ENCOUNTER — Ambulatory Visit (INDEPENDENT_AMBULATORY_CARE_PROVIDER_SITE_OTHER): Payer: Self-pay | Admitting: Primary Care

## 2023-03-12 LAB — BASIC METABOLIC PANEL
Anion gap: 14 (ref 5–15)
BUN: 26 mg/dL — ABNORMAL HIGH (ref 6–20)
CO2: 35 mmol/L — ABNORMAL HIGH (ref 22–32)
Calcium: 9.2 mg/dL (ref 8.9–10.3)
Chloride: 91 mmol/L — ABNORMAL LOW (ref 98–111)
Creatinine, Ser: 1.55 mg/dL — ABNORMAL HIGH (ref 0.61–1.24)
GFR, Estimated: 57 mL/min — ABNORMAL LOW (ref >60)
Glucose, Bld: 127 mg/dL — ABNORMAL HIGH (ref 70–99)
Potassium: 4.1 mmol/L (ref 3.5–5.1)
Sodium: 140 mmol/L (ref 135–145)

## 2023-03-12 LAB — MAGNESIUM: Magnesium: 1.8 mg/dL (ref 1.7–2.4)

## 2023-03-12 LAB — GLUCOSE, CAPILLARY
Glucose-Capillary: 198 mg/dL — ABNORMAL HIGH (ref 70–99)
Glucose-Capillary: 143 mg/dL — ABNORMAL HIGH (ref 70–99)
Glucose-Capillary: 142 mg/dL — ABNORMAL HIGH (ref 70–99)
Glucose-Capillary: 180 mg/dL — ABNORMAL HIGH (ref 70–99)

## 2023-03-12 MED ORDER — MAGNESIUM SULFATE 2 GM/50ML IV SOLN
2.0000 g | Freq: Once | INTRAVENOUS | Status: AC
  Administered 2023-03-12: 2 g via INTRAVENOUS
  Filled 2023-03-12: qty 50

## 2023-03-12 MED ORDER — ACETAZOLAMIDE 250 MG PO TABS
500.0000 mg | ORAL_TABLET | Freq: Once | ORAL | Status: AC
  Administered 2023-03-12: 500 mg via ORAL
  Filled 2023-03-12: qty 2

## 2023-03-12 MED ORDER — POTASSIUM CHLORIDE CRYS ER 20 MEQ PO TBCR
40.0000 meq | EXTENDED_RELEASE_TABLET | Freq: Once | ORAL | Status: AC
  Administered 2023-03-12: 40 meq via ORAL
  Filled 2023-03-12: qty 2

## 2023-03-12 MED ORDER — METOPROLOL SUCCINATE ER 50 MG PO TB24
50.0000 mg | ORAL_TABLET | Freq: Every day | ORAL | Status: DC
  Administered 2023-03-12 – 2023-03-13 (×2): 50 mg via ORAL
  Filled 2023-03-12 (×2): qty 1

## 2023-03-12 MED ORDER — SPIRONOLACTONE 25 MG PO TABS
25.0000 mg | ORAL_TABLET | Freq: Every day | ORAL | Status: DC
  Administered 2023-03-12 – 2023-03-13 (×2): 25 mg via ORAL
  Filled 2023-03-12 (×2): qty 1

## 2023-03-12 NOTE — Progress Notes (Signed)
Progress Note   Patient: Justin [name] WUJ:[id number] DOB: 03/11/1981 DOA: 03/10/2023     2 DOS: the patient was seen and examined on 03/12/2023   Brief hospital course: Justin [name] was admitted to the hospital with the working diagnosis of heart failure exacerbation.   42 yo male with the past medical history of hypertension, heart failure, and obesity who presented with dyspnea and edema. Reported one week of worsening edema, starting in his legs and progressing into his abdomen. Positive orthopnea and PND. He has been of heart medications including diuretics for the last month. On his initial physical examination his blood pressure was 100/87, HR 87 to 116, RR 29 and 02 saturation 95% on 4 L/min per [place]. Lungs with no wheezing or rales, positive increased work of breathing, heart with S1 and S2 present and rhythmic, no gallops, abdomen with no distention and positive lower extremity edema +++.   Chest radiograph with cardiomegaly, with bilateral hilar vascular congestion, bilateral central interstitial infiltrates, small bilateral pleural effusions.  EKG 113 bpm, right axis deviation, normal intervals, sinus rhythm with J point elevation V3 to V4, with poor R R wave progression, no significant T wave changes. Positive LVH.   07/01 patient responding well to diuresis.   Assessment and Plan: Acute on chronic systolic CHF (congestive heart failure) (HCC) Echocardiogram with reduced LV systolic function with EF 20 to 25%, global hypokinesis, mild enlarged cavity, moderate LVH, RV systolic function with moderate reduction, RV with moderate enlargement, no significant valvular disease.   Urine output 5,700  ml Systolic blood pressure 115 to 133 mmHg.   Plan to continue diuresis with furosemide 80 mg IV q12 hrs One dose of acetazolamide 500 mg today.  Continue entresto, spironolactone and empagliflozin.  TOC contacted to make sure patient has access to refills as outpatient.    Paroxysmal atrial fibrillation with RVR (HCC) Patient on sinus rhythm with controlled heart rate.  Plan to continue anticoagulation with rivaroxaban. Continue telemetry monitoring.   Essential hypertension Continue blood pressure control with entresto.   Acute kidney injury superimposed on chronic kidney disease (HCC) CKD stage 3 with base cr 1,5.   Renal function with serum cr at 1,55 with K at 4,1 and serum bicarbonate at 35. Na 140.  Mg 1,8   Will add 40 meq Kcl and 2 g Mag sulfate to prevent hypokalemia and hypomagnesemia.  Follow up renal function and electrolytes in am.  Diuresis with furosemide, spironolactone, SGLT 2 ing and one dose of acetazolamide.   Type 2 diabetes mellitus with hyperlipidemia (HCC) Continue glucose cover and monitoring with insulin sliding scale.   Continue with statin therapy.  Alcohol abuse No signs of acute withdrawal, will discontinue CIWA benzodiazepine protocol. Continue neuro checks.   Class 3 obesity (HCC) Calculated BMI of 46.7         Subjective: Patient with no chest pain, edema and dyspnea are improving.   Physical Exam: Vitals:   03/12/23 0102 03/12/23 0424 03/12/23 0750 03/12/23 0830  BP: (!) 118/95 120/81 (!) 130/90 (!) 133/94  Pulse: 95 94 97 (!) 101  Resp: 18 18 18 17   Temp: 98.5 F (36.9 C) 98.3 F (36.8 C) 98.1 F (36.7 C) 98.4 F (36.9 C)  TempSrc: Oral Oral Oral Oral  SpO2: 95% 94% 92% 96%  Weight:  130 kg    Height:       Neurology awake and alert ENT with no pallor Cardiovascular with S1 and S2 present and rhythmic with no  gallops, rubs or murmurs No JVD Positive lower extremity edema ++ Respiratory with rales bilaterally with no wheezing or rhonchi Abdomen with no distention  Data Reviewed:    Family Communication: no family at the bedside   Disposition: Status is: Inpatient Remains inpatient appropriate because: IV diuresis   Planned Discharge Destination: Home   Author: Coralie Keens, MD 03/12/2023 12:26 PM  For on call review [email or web address].

## 2023-03-12 NOTE — Progress Notes (Signed)
Heart Failure Navigator Progress Note  Assessed for Heart & Vascular TOC clinic readiness.  Patient does not meet criteria due to Advanced Heart Failure Team patient.   Navigator will sign off at this time.    Yamilex Borgwardt, BSN, RN Heart Failure Nurse Navigator Secure Chat Only   

## 2023-03-12 NOTE — Progress Notes (Signed)
Cardiology Progress Note  Patient ID: Justin [name] MRN: [id number] DOB: June 22, 1981 Date of Encounter: 03/12/2023  Primary Cardiologist: Jodelle Red, MD  Subjective   Chief Complaint: SOB  HPI: Still volume up. Good UOP. Not back to baseline.   ROS:  All other ROS reviewed and negative. Pertinent positives noted in the HPI.     Inpatient Medications  Scheduled Meds:  empagliflozin  10 mg Oral Daily   folic acid  1 mg Oral Daily   furosemide  80 mg Intravenous BID   insulin aspart  0-15 Units Subcutaneous TID WC   insulin aspart  0-5 Units Subcutaneous QHS   multivitamin with minerals  1 tablet Oral Daily   rivaroxaban  20 mg Oral Q supper   rosuvastatin  10 mg Oral Daily   sacubitril-valsartan  1 tablet Oral BID   sodium chloride flush  3 mL Intravenous Q12H   spironolactone  25 mg Oral Daily   Continuous Infusions:  sodium chloride     PRN Meds: sodium chloride, acetaminophen, ondansetron (ZOFRAN) IV, sodium chloride flush   Vital Signs   Vitals:   03/11/23 1951 03/12/23 0102 03/12/23 0424 03/12/23 0750  BP: 130/83 (!) 118/95 120/81 (!) 130/90  Pulse: 97 95 94 97  Resp: 19 18 18 18   Temp: 98.7 F (37.1 C) 98.5 F (36.9 C) 98.3 F (36.8 C) 98.1 F (36.7 C)  TempSrc: Oral Oral Oral Oral  SpO2: 91% 95% 94% 92%  Weight:   130 kg   Height:        Intake/Output Summary (Last 24 hours) at 03/12/2023 0847 Last data filed at 03/12/2023 0835 Gross per 24 hour  Intake 360 ml  Output 6200 ml  Net -5840 ml      03/12/2023    4:24 AM 03/11/2023    5:00 AM 03/10/2023    9:05 AM  Last 3 Weights  Weight (lbs) 286 lb 9.6 oz 289 lb 14.5 oz 254 lb  Weight (kg) 130.001 kg 131.5 kg 115.214 kg      Telemetry  Overnight telemetry shows sinus rhythm 90s, which I personally reviewed.    Physical Exam   Vitals:   03/11/23 1951 03/12/23 0102 03/12/23 0424 03/12/23 0750  BP: 130/83 (!) 118/95 120/81 (!) 130/90  Pulse: 97 95 94 97  Resp: 19 18 18  18   Temp: 98.7 F (37.1 C) 98.5 F (36.9 C) 98.3 F (36.8 C) 98.1 F (36.7 C)  TempSrc: Oral Oral Oral Oral  SpO2: 91% 95% 94% 92%  Weight:   130 kg   Height:        Intake/Output Summary (Last 24 hours) at 03/12/2023 0847 Last data filed at 03/12/2023 0835 Gross per 24 hour  Intake 360 ml  Output 6200 ml  Net -5840 ml       03/12/2023    4:24 AM 03/11/2023    5:00 AM 03/10/2023    9:05 AM  Last 3 Weights  Weight (lbs) 286 lb 9.6 oz 289 lb 14.5 oz 254 lb  Weight (kg) 130.001 kg 131.5 kg 115.214 kg    Body mass index is 46.26 kg/m.  General: Well nourished, well developed, in no acute distress Head: Atraumatic, normal size  Eyes: PEERLA, EOMI  Neck: Supple, JVD 10-12 cmH2O Endocrine: No thryomegaly Cardiac: Normal S1, S2; RRR; no murmurs, rubs, or gallops Lungs: Clear to auscultation bilaterally, no wheezing, rhonchi or rales  Abd: Distended abdomen Ext: 1+ pitting edema Musculoskeletal: No deformities, BUE and BLE strength  normal and equal Skin: Warm and dry, no rashes   Neuro: Alert and oriented to person, place, time, and situation, CNII-XII grossly intact, no focal deficits  Psych: Normal mood and affect   Labs  High Sensitivity Troponin:   Recent Labs  Lab 03/10/23 0918 03/10/23 1149  TROPONINIHS 78* 88*     Cardiac EnzymesNo results for input(s): "TROPONINI" in the last 168 hours. No results for input(s): "TROPIPOC" in the last 168 hours.  Chemistry Recent Labs  Lab 03/10/23 0918 03/11/23 0109 03/12/23 0054  NA 135 140 140  K 4.3 4.2 4.1  CL 98 94* 91*  CO2 27 30 35*  GLUCOSE 295* 185* 127*  BUN 19 22* 26*  CREATININE 1.60* 1.39* 1.55*  CALCIUM 8.7* 9.3 9.2  GFRNONAA 54* >60 57*  ANIONGAP 10 16* 14    Hematology Recent Labs  Lab 03/10/23 0918  WBC 8.0  RBC 5.18  HGB 15.3  HCT 48.9  MCV 94.4  MCH 29.5  MCHC 31.3  RDW 15.1  PLT 267   BNP Recent Labs  Lab 03/10/23 0918  BNP 697.7*    DDimer No results for input(s): "DDIMER" in the last  168 hours.   Radiology  ECHOCARDIOGRAM LIMITED  Result Date: 03/11/2023    ECHOCARDIOGRAM LIMITED REPORT   Patient Name:   Justin [name] Date of Exam: 03/11/2023 Medical Rec #:  [id number]                      Height:       66.0 in Accession #:    [id number]                     Weight:       289.9 lb Date of Birth:  [birth year]                      BSA:          2.342 m Patient Age:    42 years                       BP:           125/79 mmHg Patient Gender: M                              HR:           82 bpm. Exam Location:  Inpatient Procedure: Limited Echo, Cardiac Doppler, Limited Color Doppler and Intracardiac            Opacification Agent Indications:    CHF-Acute Systolic I50.21  History:        Patient has prior history of Echocardiogram examinations, most                 recent 11/17/2022. CHF, Alcohol abuse, Arrythmias:Atrial                 Fibrillation; Risk Factors:Hypertension, Diabetes and                 Non-Smoker.  Sonographer:    Aron Baba Referring Phys: [id number] Alessandra Bevels  Sonographer Comments: Image acquisition challenging due to patient body habitus. IMPRESSIONS  1. Left ventricular ejection fraction, by estimation, is 20 to 25%. The left ventricle has severely decreased function. The left ventricle demonstrates global hypokinesis. The left ventricular internal cavity size was mildly dilated. There  is moderate concentric left ventricular hypertrophy. Left ventricular diastolic parameters are consistent with Grade II diastolic dysfunction (pseudonormalization).  2. Right ventricular systolic function is moderately reduced. The right ventricular size is moderately enlarged.  3. The mitral valve is grossly normal. Mild mitral valve regurgitation. No evidence of mitral stenosis.  4. The inferior vena cava is dilated in size with <50% respiratory variability, suggesting right atrial pressure of 15 mmHg. Comparison(s): No significant change from prior study. FINDINGS  Left  Ventricle: Left ventricular ejection fraction, by estimation, is 20 to 25%. The left ventricle has severely decreased function. The left ventricle demonstrates global hypokinesis. Definity contrast agent was given IV to delineate the left ventricular endocardial borders. The left ventricular internal cavity size was mildly dilated. There is moderate concentric left ventricular hypertrophy. Left ventricular diastolic parameters are consistent with Grade II diastolic dysfunction (pseudonormalization). Right Ventricle: The right ventricular size is moderately enlarged. No increase in right ventricular wall thickness. Right ventricular systolic function is moderately reduced. Mitral Valve: The mitral valve is grossly normal. Mild mitral valve regurgitation. No evidence of mitral valve stenosis. Venous: The inferior vena cava is dilated in size with less than 50% respiratory variability, suggesting right atrial pressure of 15 mmHg. Additional Comments: Spectral Doppler performed. Color Doppler performed.  LEFT VENTRICLE PLAX 2D LVIDd:         5.70 cm LVIDs:         5.40 cm LV PW:         1.50 cm LV IVS:        1.40 cm  LEFT ATRIUM         Index LA diam:    5.60 cm 2.39 cm/m  MITRAL VALVE MV Area (PHT): 5.13 cm MV Decel Time: 148 msec MV E velocity: 83.80 cm/s MV A velocity: 58.20 cm/s MV E/A ratio:  1.44 Lennie Odor MD Electronically signed by Lennie Odor MD Signature Date/Time: 03/11/2023/11:10:59 AM    Final    DG Chest 2 View  Result Date: 03/10/2023 CLINICAL DATA:  42 year old male with history of shortness of breath and left leg swelling. EXAM: CHEST - 2 VIEW COMPARISON:  Chest x-ray 11/16/2022. FINDINGS: There is cephalization of the pulmonary vasculature, indistinctness of the interstitial markings, and patchy airspace disease throughout the lungs bilaterally suggestive of moderate pulmonary edema. Small bilateral pleural effusions. Moderate cardiomegaly. Upper mediastinal contours are within normal  limits. IMPRESSION: 1. The appearance of the chest suggests moderate to severe congestive heart failure, as above. Electronically Signed   By: Trudie Reed M.D.   On: 03/10/2023 10:08    Cardiac Studies  TTE 03/11/2023  1. Left ventricular ejection fraction, by estimation, is 20 to 25%. The  left ventricle has severely decreased function. The left ventricle  demonstrates global hypokinesis. The left ventricular internal cavity size  was mildly dilated. There is moderate  concentric left ventricular hypertrophy. Left ventricular diastolic  parameters are consistent with Grade II diastolic dysfunction  (pseudonormalization).   2. Right ventricular systolic function is moderately reduced. The right  ventricular size is moderately enlarged.   3. The mitral valve is grossly normal. Mild mitral valve regurgitation.  No evidence of mitral stenosis.   4. The inferior vena cava is dilated in size with <50% respiratory  variability, suggesting right atrial pressure of 15 mmHg.   Patient Profile  Arieon Philip is a 42 y.o. male with chronic systolic heart failure, nonischemic, diabetes, hypertension, alcohol abuse, paroxysmal A-fib, polysubstance abuse admitted on 03/10/2023 for  acute on chronic systolic heart failure.  Assessment & Plan   # Acute on chronic systolic heart failure, EF 20-25% # Hypertrophic CM -Longstanding history of a nonischemic cardiomyopathy. -Left heart catheterization last year with no significant CAD. -He was out of his medications and was grossly volume overloaded. -Net -5.3 L overnight.  Net -11.1 L since admission.  Weights inaccurate.  Still volume up.  Continue with Lasix 80 mg IV twice daily. -Anticipate 1 more day of IV diuretic therapy.  Likely can transition to oral diuretics tomorrow. -Add acetazolamide 500 mg as a one-time dose given rising bicarbonate value. -Continue Jardiance 10 mg daily, Entresto 24-26 mg twice daily -Add back home  metoprolol succinate 50 mg daily.  Add Aldactone 25 mg daily.  Kidney function is stable. -Etiology of his cardiomyopathy is felt to be hypertension and drug-related.  Also concerns for HCM. CMR mentions possibility of sarcoidosis.  Will need outpatient PET scan.  # Paroxysmal A-fib -Maintaining sinus rhythm.  Continue metoprolol as above.  On Xarelto.  # Polysubstance abuse -Cessation recommended  # CKD 3 A -Stable  # Diabetes -Per hospital medicine      For questions or updates, please contact [facility] Please consult [email or web address] for contact info under        Signed, Gerri Spore T. Flora Lipps, MD, [facility] [place]  [facility]  03/12/2023 8:47 AM

## 2023-03-12 NOTE — Plan of Care (Signed)

## 2023-03-13 ENCOUNTER — Other Ambulatory Visit (HOSPITAL_COMMUNITY): Payer: Self-pay

## 2023-03-13 LAB — BASIC METABOLIC PANEL
Anion gap: 7 (ref 5–15)
BUN: 25 mg/dL — ABNORMAL HIGH (ref 6–20)
CO2: 36 mmol/L — ABNORMAL HIGH (ref 22–32)
Calcium: 8.9 mg/dL (ref 8.9–10.3)
Chloride: 91 mmol/L — ABNORMAL LOW (ref 98–111)
Creatinine, Ser: 1.67 mg/dL — ABNORMAL HIGH (ref 0.61–1.24)
GFR, Estimated: 52 mL/min — ABNORMAL LOW (ref >60)
Glucose, Bld: 169 mg/dL — ABNORMAL HIGH (ref 70–99)
Potassium: 3.6 mmol/L (ref 3.5–5.1)
Sodium: 134 mmol/L — ABNORMAL LOW (ref 135–145)

## 2023-03-13 LAB — MAGNESIUM: Magnesium: 2.6 mg/dL — ABNORMAL HIGH (ref 1.7–2.4)

## 2023-03-13 LAB — GLUCOSE, CAPILLARY
Glucose-Capillary: 142 mg/dL — ABNORMAL HIGH (ref 70–99)
Glucose-Capillary: 157 mg/dL — ABNORMAL HIGH (ref 70–99)

## 2023-03-13 MED ORDER — RIVAROXABAN 20 MG PO TABS
20.0000 mg | ORAL_TABLET | Freq: Every day | ORAL | 3 refills | Status: DC
  Filled 2023-03-13: qty 30, 30d supply, fill #0

## 2023-03-13 MED ORDER — METOPROLOL SUCCINATE ER 100 MG PO TB24
100.0000 mg | ORAL_TABLET | Freq: Every day | ORAL | 3 refills | Status: DC
  Filled 2023-03-13: qty 90, 90d supply, fill #0

## 2023-03-13 MED ORDER — TORSEMIDE 20 MG PO TABS
40.0000 mg | ORAL_TABLET | Freq: Every day | ORAL | 3 refills | Status: DC
  Filled 2023-03-13: qty 180, 90d supply, fill #0

## 2023-03-13 MED ORDER — TORSEMIDE 40 MG PO TABS
20.0000 mg | ORAL_TABLET | Freq: Every day | ORAL | 3 refills | Status: DC
  Filled 2023-03-13: qty 60, fill #0

## 2023-03-13 MED ORDER — TORSEMIDE 20 MG PO TABS
40.0000 mg | ORAL_TABLET | Freq: Every day | ORAL | Status: DC
  Administered 2023-03-13: 40 mg via ORAL
  Filled 2023-03-13: qty 2

## 2023-03-13 MED ORDER — EMPAGLIFLOZIN 10 MG PO TABS
10.0000 mg | ORAL_TABLET | Freq: Every day | ORAL | 3 refills | Status: DC
  Filled 2023-03-13: qty 30, 30d supply, fill #0

## 2023-03-13 MED ORDER — SACUBITRIL-VALSARTAN 24-26 MG PO TABS
1.0000 | ORAL_TABLET | Freq: Two times a day (BID) | ORAL | 3 refills | Status: DC
  Filled 2023-03-13 (×2): qty 60, 30d supply, fill #0

## 2023-03-13 MED ORDER — METOPROLOL SUCCINATE ER 100 MG PO TB24: 100.0000 mg | ORAL_TABLET | Freq: Every day | ORAL | Status: DC

## 2023-03-13 MED ORDER — ROSUVASTATIN CALCIUM 10 MG PO TABS
10.0000 mg | ORAL_TABLET | Freq: Every day | ORAL | 3 refills | Status: DC
  Filled 2023-03-13: qty 90, 90d supply, fill #0

## 2023-03-13 MED ORDER — SPIRONOLACTONE 25 MG PO TABS
25.0000 mg | ORAL_TABLET | Freq: Every day | ORAL | 3 refills | Status: DC
  Filled 2023-03-13: qty 90, 90d supply, fill #0

## 2023-03-13 NOTE — Progress Notes (Signed)
Order to discharge pt home.  Pt on 3L oxygen and medications to be filled by TOC.  Case management/CSW aware of oxygen needs.

## 2023-03-13 NOTE — Progress Notes (Signed)
[facility] CSW referred to assist patient with follow up regarding a no income attestation form from [employer] and to inquire about high no show rate.   CSW and patient with interpreter line contacted [employer] although waited on hold for 15 minutes with no answer. CSW will attempt again to contact [employer] to request no income attestation form. CSW also provided the patient with the contact information in case he is discharged before CSW can return to the room.  CSW inquired about no shows and patient reports that "I have to work and my job takes me to [place] and [place] so I am not in town for the appointment'. CSW encouraged patient to reschedule his appointment to a date when he is in town. CSW stressed the importance of follow up to avoid further hospitalizations.   Patient acknowledges via interpreter and will follow up. CSW will see patient at clinic follow up appointment next week. Lasandra Beech, LCSW, CCSW-MCS [phone]

## 2023-03-13 NOTE — Progress Notes (Signed)
Cardiology Progress Note  Patient ID: Justin [name] MRN: [id number] DOB: Jun 06, 1981 Date of Encounter: 03/13/2023  Primary Cardiologist: Jodelle Red, MD  Subjective   Chief Complaint: None.   HPI: Appears euvolemic on exam.  Good diuresis.  Transition to oral diuretics today.  ROS:  All other ROS reviewed and negative. Pertinent positives noted in the HPI.     Inpatient Medications  Scheduled Meds:  empagliflozin  10 mg Oral Daily   folic acid  1 mg Oral Daily   insulin aspart  0-15 Units Subcutaneous TID WC   insulin aspart  0-5 Units Subcutaneous QHS   [START ON 03/14/2023] metoprolol succinate  100 mg Oral Daily   multivitamin with minerals  1 tablet Oral Daily   rivaroxaban  20 mg Oral Q supper   rosuvastatin  10 mg Oral Daily   sacubitril-valsartan  1 tablet Oral BID   sodium chloride flush  3 mL Intravenous Q12H   spironolactone  25 mg Oral Daily   torsemide  40 mg Oral Daily   Continuous Infusions:  sodium chloride     PRN Meds: sodium chloride, acetaminophen, ondansetron (ZOFRAN) IV, sodium chloride flush   Vital Signs   Vitals:   03/13/23 0509 03/13/23 0726 03/13/23 0830 03/13/23 0831  BP: (!) 126/98 (!) 142/101    Pulse: 80 90    Resp: 18 19    Temp: 97.7 F (36.5 C) 97.9 F (36.6 C)    TempSrc: Oral Oral    SpO2: 96% 94% (!) 86% 91%  Weight:      Height:        Intake/Output Summary (Last 24 hours) at 03/13/2023 0939 Last data filed at 03/13/2023 0800 Gross per 24 hour  Intake 1211.65 ml  Output 6550 ml  Net -5338.35 ml      03/12/2023    4:24 AM 03/11/2023    5:00 AM 03/10/2023    9:05 AM  Last 3 Weights  Weight (lbs) 286 lb 9.6 oz 289 lb 14.5 oz 254 lb  Weight (kg) 130.001 kg 131.5 kg 115.214 kg      Telemetry  Overnight telemetry shows sinus rhythm 80s, which I personally reviewed.   Physical Exam   Vitals:   03/13/23 0509 03/13/23 0726 03/13/23 0830 03/13/23 0831  BP: (!) 126/98 (!) 142/101    Pulse: 80 90     Resp: 18 19    Temp: 97.7 F (36.5 C) 97.9 F (36.6 C)    TempSrc: Oral Oral    SpO2: 96% 94% (!) 86% 91%  Weight:      Height:        Intake/Output Summary (Last 24 hours) at 03/13/2023 0939 Last data filed at 03/13/2023 0800 Gross per 24 hour  Intake 1211.65 ml  Output 6550 ml  Net -5338.35 ml       03/12/2023    4:24 AM 03/11/2023    5:00 AM 03/10/2023    9:05 AM  Last 3 Weights  Weight (lbs) 286 lb 9.6 oz 289 lb 14.5 oz 254 lb  Weight (kg) 130.001 kg 131.5 kg 115.214 kg    Body mass index is 46.26 kg/m.  General: Well nourished, well developed, in no acute distress Head: Atraumatic, normal size  Eyes: PEERLA, EOMI  Neck: Supple, JVD 5 to 7 cm of water Endocrine: No thryomegaly Cardiac: Normal S1, S2; RRR; no murmurs, rubs, or gallops Lungs: Clear to auscultation bilaterally, no wheezing, rhonchi or rales  Abd: Soft, nontender, no hepatomegaly  Ext:  No edema, pulses 2+ Musculoskeletal: No deformities, BUE and BLE strength normal and equal Skin: Warm and dry, no rashes   Neuro: Alert and oriented to person, place, time, and situation, CNII-XII grossly intact, no focal deficits  Psych: Normal mood and affect   Labs  High Sensitivity Troponin:   Recent Labs  Lab 03/10/23 0918 03/10/23 1149  TROPONINIHS 78* 88*     Cardiac EnzymesNo results for input(s): "TROPONINI" in the last 168 hours. No results for input(s): "TROPIPOC" in the last 168 hours.  Chemistry Recent Labs  Lab 03/11/23 0109 03/12/23 0054 03/13/23 0040  NA 140 140 134*  K 4.2 4.1 3.6  CL 94* 91* 91*  CO2 30 35* 36*  GLUCOSE 185* 127* 169*  BUN 22* 26* 25*  CREATININE 1.39* 1.55* 1.67*  CALCIUM 9.3 9.2 8.9  GFRNONAA >60 57* 52*  ANIONGAP 16* 14 7    Hematology Recent Labs  Lab 03/10/23 0918  WBC 8.0  RBC 5.18  HGB 15.3  HCT 48.9  MCV 94.4  MCH 29.5  MCHC 31.3  RDW 15.1  PLT 267   BNP Recent Labs  Lab 03/10/23 0918  BNP 697.7*    DDimer No results for input(s): "DDIMER" in the  last 168 hours.   Radiology  ECHOCARDIOGRAM LIMITED  Result Date: 03/11/2023    ECHOCARDIOGRAM LIMITED REPORT   Patient Name:   TRUEN SCHAUL Date of Exam: 03/11/2023 Medical Rec #:  [id number]                      Height:       66.0 in Accession #:    [id number]                     Weight:       289.9 lb Date of Birth:  1981/09/04                      BSA:          2.342 m Patient Age:    42 years                       BP:           125/79 mmHg Patient Gender: M                              HR:           82 bpm. Exam Location:  Inpatient Procedure: Limited Echo, Cardiac Doppler, Limited Color Doppler and Intracardiac            Opacification Agent Indications:    CHF-Acute Systolic I50.21  History:        Patient has prior history of Echocardiogram examinations, most                 recent 11/17/2022. CHF, Alcohol abuse, Arrythmias:Atrial                 Fibrillation; Risk Factors:Hypertension, Diabetes and                 Non-Smoker.  Sonographer:    Aron Baba Referring Phys: [id number] Alessandra Bevels  Sonographer Comments: Image acquisition challenging due to patient body habitus. IMPRESSIONS  1. Left ventricular ejection fraction, by estimation, is 20 to 25%. The left ventricle has severely decreased function. The left ventricle demonstrates global  hypokinesis. The left ventricular internal cavity size was mildly dilated. There is moderate concentric left ventricular hypertrophy. Left ventricular diastolic parameters are consistent with Grade II diastolic dysfunction (pseudonormalization).  2. Right ventricular systolic function is moderately reduced. The right ventricular size is moderately enlarged.  3. The mitral valve is grossly normal. Mild mitral valve regurgitation. No evidence of mitral stenosis.  4. The inferior vena cava is dilated in size with <50% respiratory variability, suggesting right atrial pressure of 15 mmHg. Comparison(s): No significant change from prior study. FINDINGS   Left Ventricle: Left ventricular ejection fraction, by estimation, is 20 to 25%. The left ventricle has severely decreased function. The left ventricle demonstrates global hypokinesis. Definity contrast agent was given IV to delineate the left ventricular endocardial borders. The left ventricular internal cavity size was mildly dilated. There is moderate concentric left ventricular hypertrophy. Left ventricular diastolic parameters are consistent with Grade II diastolic dysfunction (pseudonormalization). Right Ventricle: The right ventricular size is moderately enlarged. No increase in right ventricular wall thickness. Right ventricular systolic function is moderately reduced. Mitral Valve: The mitral valve is grossly normal. Mild mitral valve regurgitation. No evidence of mitral valve stenosis. Venous: The inferior vena cava is dilated in size with less than 50% respiratory variability, suggesting right atrial pressure of 15 mmHg. Additional Comments: Spectral Doppler performed. Color Doppler performed.  LEFT VENTRICLE PLAX 2D LVIDd:         5.70 cm LVIDs:         5.40 cm LV PW:         1.50 cm LV IVS:        1.40 cm  LEFT ATRIUM         Index LA diam:    5.60 cm 2.39 cm/m  MITRAL VALVE MV Area (PHT): 5.13 cm MV Decel Time: 148 msec MV E velocity: 83.80 cm/s MV A velocity: 58.20 cm/s MV E/A ratio:  1.44 Lennie Odor MD Electronically signed by Lennie Odor MD Signature Date/Time: 03/11/2023/11:10:59 AM    Final     Cardiac Studies  TTE 03/11/2023  1. Left ventricular ejection fraction, by estimation, is 20 to 25%. The  left ventricle has severely decreased function. The left ventricle  demonstrates global hypokinesis. The left ventricular internal cavity size  was mildly dilated. There is moderate  concentric left ventricular hypertrophy. Left ventricular diastolic  parameters are consistent with Grade II diastolic dysfunction  (pseudonormalization).   2. Right ventricular systolic function is  moderately reduced. The right  ventricular size is moderately enlarged.   3. The mitral valve is grossly normal. Mild mitral valve regurgitation.  No evidence of mitral stenosis.   4. The inferior vena cava is dilated in size with <50% respiratory  variability, suggesting right atrial pressure of 15 mmHg.   Patient Profile  Judah Ellett is a 42 y.o. male with chronic systolic heart failure, nonischemic, diabetes, hypertension, alcohol abuse, paroxysmal A-fib, polysubstance abuse admitted on 03/10/2023 for acute on chronic systolic heart failure.   Assessment & Plan   # Acute systolic heart failure, EF 20-25% # Hypertrophic cardiomyopathy -Longstanding nonischemic cardiomyopathy.  Has used drugs in the past as well as alcohol abuse.  Concerns for possible sarcoidosis on MRI as well.  Commented that he possibly has hypertrophic cardiomyopathy as well.  He will need outpatient follow-up with heart failure.  We will arrange this. -Net -6.2 L overnight.  Net -17 L since admission.  He appears euvolemic to me.  Transition to torsemide 40 mg  daily. -Continue Aldactone 25 mg daily, Entresto 24-26 mg twice daily.  Increase metoprolol succinate to 100 mg daily.  He is on Jardiance 10 mg daily.  Further titration as an outpatient.  # Paroxysmal A-fib -Maintaining sinus rhythm.  # CKD stage IIIa -Slight increase in serum creatinine but stable.  [facility] will sign off.   Medication Recommendations: As above Other recommendations (labs, testing, etc): None Follow up as an outpatient: We will arrange outpatient follow-up in 1 to 2 weeks  For questions or updates, please contact [facility] Please consult [email or web address] for contact info under        Signed, Gerri Spore T. Flora Lipps, MD, [facility]  [facility]  03/13/2023 9:39 AM

## 2023-03-13 NOTE — TOC Initial Note (Addendum)
Transition of Care [facility]) - Initial/Assessment Note    Patient Details  Name: Justin [name] MRN: [id number] Date of Birth: 1981/08/15  [facility]) CM/SW Contact:    Leone Haven, RN Phone Number: 03/13/2023, 12:46 PM  Clinical Narrative:                 NCM spoke with patient using Spanish interpreter and video interpreter [phone].  From home with friends, he has no PCP, a follow up apt has been made on the AVS.  He has no insurance on file.  Financial Counselor , Meriam Sprague states that [employer] tried to screen him back in March but they did not get an answer ,, she will have them to screen him again.  He currently does not have any HH services in place.  He is indep and self ambulatory.  He states he does not have any DME or a scale, NCM will give him a scale prior to dc.  He states his friends will transport him home at dc.  Friends are his support system.  TOC to fill his medications using the HF Fund.  He states he does use salt in his diet but not more than normal.  He does not have a bp cuff to check his bp.  He will need to purchase this from a local pharmacy. He will need home oxygen, will need to do LOG for patient. Awaiting information from [employer] to start the LOG process for 30 day assistance. LOG approved.  [employer] will bring oxygen up to room and set up concentrator at the home. NCM explained to patient that this will cover for 30 days and then [employer] will call to get a credit card number to continue to cover the oxygen he said he understands.  Expected Discharge Plan: Home/Self Care Barriers to Discharge: Inadequate or no insurance   Patient Goals and CMS Choice Patient states their goals for this hospitalization and ongoing recovery are:: return home with friends   Choice offered to / list presented to : NA      Expected Discharge Plan and Services In-house Referral: NA Discharge Planning Services: CM Consult Post Acute Care Choice:  NA Living arrangements for the past 2 months: Single Family Home Expected Discharge Date: 03/13/23               DME Arranged: N/A DME Agency: NA       HH Arranged: NA          Prior Living Arrangements/Services Living arrangements for the past 2 months: Single Family Home Lives with:: Friends Patient language and need for interpreter reviewed:: Yes Do you feel safe going back to the place where you live?: Yes      Need for Family Participation in Patient Care: No (Comment) Care giver support system in place?: No (comment)   Criminal Activity/Legal Involvement Pertinent to Current Situation/Hospitalization: No - Comment as needed  Activities of Daily Living Home Assistive Devices/Equipment: None ADL Screening (condition at time of admission) Patient's cognitive ability adequate to safely complete daily activities?: Yes Is the patient deaf or have difficulty hearing?: No Does the patient have difficulty seeing, even when wearing glasses/contacts?: No Does the patient have difficulty concentrating, remembering, or making decisions?: No Patient able to express need for assistance with ADLs?: Yes Does the patient have difficulty dressing or bathing?: No Independently performs ADLs?: Yes (appropriate for developmental age) Does the patient have difficulty walking or climbing stairs?: Yes Weakness of  Legs: None Weakness of Arms/Hands: None  Permission Sought/Granted                  Emotional Assessment Appearance:: Appears stated age Attitude/Demeanor/Rapport: Engaged Affect (typically observed): Appropriate Orientation: : Oriented to Self, Oriented to Place, Oriented to  Time, Oriented to Situation Alcohol / Substance Use: Not Applicable Psych Involvement: No (comment)  Admission diagnosis:  CHF (congestive heart failure) (HCC) [I50.9] Acute respiratory failure with hypoxia (HCC) [J96.01] Patient Active Problem List   Diagnosis Date Noted   CHF (congestive heart  failure) (HCC) 03/10/2023   Uncontrolled hypertension 11/17/2022   Type 2 diabetes mellitus without complication, without long-term current use of insulin (HCC) 11/17/2022   Acute exacerbation of CHF (congestive heart failure) (HCC) 11/16/2022   Paroxysmal atrial fibrillation with RVR (HCC) 09/22/2022   Acute on chronic congestive heart failure (HCC) 09/21/2022   Chronic kidney disease, stage II (mild) 09/21/2022   Alcohol abuse 09/21/2022   Class 3 obesity (HCC) 06/04/2022   Acute kidney injury superimposed on chronic kidney disease (HCC) 06/03/2022   Acute on chronic systolic CHF (congestive heart failure) (HCC) 06/02/2022   Type 2 diabetes mellitus with hyperlipidemia (HCC) 06/02/2022   Polysubstance abuse (HCC) 06/02/2022   Ventricular tachycardia (HCC)    Primary hypertension    Cardiomyopathy due to hypertension, with heart failure (HCC)    Acute systolic CHF (congestive heart failure) (HCC)    Nonrheumatic mitral valve regurgitation    Dyspnea    Community acquired pneumonia    Acute hypoxemic respiratory failure (HCC) 02/09/2022   Hypertriglyceridemia 04/18/2018   Nonischemic cardiomyopathy (HCC) 03/29/2018   Chronic diastolic heart failure (HCC) 03/29/2018   Essential hypertension 03/29/2018   PCP:  Grayce Sessions, NP Pharmacy:   [facility] [address] [place] [address] Phone: [phone] Fax: [phone]     Social Determinants of Health (SDOH) Social History: SDOH Screenings   Food Insecurity: No Food Insecurity (03/10/2023)  Housing: Low Risk  (03/10/2023)  Transportation Needs: Unmet Transportation Needs (03/10/2023)  Utilities: Not At Risk (03/10/2023)  Alcohol Screen: Medium Risk (02/20/2022)  Depression (PHQ2-9): Low Risk  (10/12/2022)  Financial Resource Strain: Medium Risk (06/05/2022)  Tobacco Use: Low Risk  (03/10/2023)   SDOH Interventions:     Readmission Risk Interventions     No data to display

## 2023-03-13 NOTE — Progress Notes (Signed)
Portable oxygen tank delivered to pts room.  Interpreter used by [employer].    Medications delivered and at bedside.

## 2023-03-13 NOTE — Progress Notes (Signed)
Pt instructions given by using interpreter [phone]. IV and Tele monitor removed. Pt waiting on Medications and portable 02. Has a friend that will pick him up after.

## 2023-03-13 NOTE — Discharge Summary (Addendum)
Physician Discharge Summary   Patient: Justin [name] MRN: [id number] DOB: 30-Jan-1981  Admit date:     03/10/2023  Discharge date: 03/13/23  Discharge Physician: York Ram Huntleigh Doolen   PCP: Grayce Sessions, NP   Recommendations at discharge:    Furosemide has been changed torsemide, entresto dose was decreased and metoprolol dose has been increased. Spironolactone dose increased to 25 mg daily.  Hold metformin for now.  I have ordered 3 refills to his medications and he will get further assistance from [facility] to continue to have access to his cardiac medications to prevent re hospitalizations.  Follow up with Gwinda Passe NP in 7 to 10 days. Follow up with Cardiology and heart failure as scheduled. Follow up renal function in 7 days as outpatient.  Patient had 02 desaturation on ambulation down to 83%, at rest on room air is 97%. Will add supplemental 02 to use during ambulation and at night for sleep. Follow up ambulatory oxymetry as outpatient.   Discharge Diagnoses: Active Problems:   Acute on chronic systolic CHF (congestive heart failure) (HCC)   Paroxysmal atrial fibrillation with RVR (HCC)   Essential hypertension   Acute kidney injury superimposed on chronic kidney disease (HCC)   Type 2 diabetes mellitus with hyperlipidemia (HCC)   Alcohol abuse   Class 3 obesity (HCC)  Resolved Problems:   * No resolved hospital problems. [facility] Course: Mr. [name] was admitted to the hospital with the working diagnosis of heart failure exacerbation.   42 yo male with the past medical history of hypertension, heart failure, alcohol abuse and obesity who presented with dyspnea and edema. Reported one week of worsening edema, starting in his legs and progressing into his abdomen. Positive orthopnea and PND. He has been of heart medications including diuretics for the last month. On his initial physical examination his blood pressure was 100/87, HR 87 to 116,  RR 29 and 02 saturation 95% on 4 L/min per [place]. Lungs with no wheezing or rales, positive increased work of breathing, heart with S1 and S2 present and rhythmic, no gallops, abdomen with no distention and positive lower extremity edema +++.   Na 135, K 4,3 Cl 98, bicarbonate 27, glucose 295, BUN 19 and cr 1,60  BNP 697  High sensitive troponin 78 and 88  Wbc 8,0 hgb 15.3 plt 267   Chest radiograph with cardiomegaly, with bilateral hilar vascular congestion, bilateral central interstitial infiltrates, small bilateral pleural effusions.  EKG 113 bpm, right axis deviation, normal intervals, sinus rhythm with J point elevation V3 to V4, with poor R R wave progression, no significant T wave changes. Positive LVH.   07/01 patient responding well to diuresis.  07/02 volume status has improved, patient will get 3 fills to his medications and further assistance for outpatient medications.  Assessment and Plan: Acute on chronic systolic CHF (congestive heart failure) (HCC) Echocardiogram with reduced LV systolic function with EF 20 to 25%, global hypokinesis, mild enlarged cavity, moderate LVH, RV systolic function with moderate reduction, RV with moderate enlargement, no significant valvular disease.   Patient was placed on IV furosemide for diuresis, negative fluid balance was achieved, - 17,088 ml with significant improvement in his symptoms.   Patient had one dose of acetazolamide 500 mg with good clinical response.   Continue medical therapy with entresto, spironolactone ,metoprolol and empagliflozin.  Dose of entresto has been reduced and dose of metoprolol has been increased. Changed furosemide to torsemide.  Follow up as outpatient.  Acute on chronic hypoxemic respiratory failure.  Improved oxygenation with diuresis.  At the time of discharge only desaturation on ambulation. Possible related to core pulmonale (chronic), will add supplemental 02 per Rew to use at home on ambulation and for  sleep. Follow up ambulatory oxymetry as outpatient.   Paroxysmal atrial fibrillation with RVR (HCC) Patient on sinus rhythm with controlled heart rate.  Plan to continue anticoagulation with rivaroxaban.  Essential hypertension Continue blood pressure control with entresto and metoprolol. Diuresis with torsemide, empagliflozin and spironolactone.   Acute kidney injury superimposed on chronic kidney disease (HCC) CKD stage 3a with base cr 1,5.  Hyponatremia and Hypomagnesemia.   At the time of his discharge his renal function had a serum cr of 1,67 with K at 3,6 and serum bicarbonate at 36.  Na 134 and Mg 2,6   Diuresis with furosemide, spironolactone, and SGLT 2 inh   Type 2 diabetes mellitus with hyperlipidemia (HCC) His glucose remained stable, he was placed on insulin sliding scale during his hospitalization.   Continue with statin therapy.  Alcohol abuse No signs of acute withdrawal.  Class 3 obesity (HCC) Calculated BMI of 46.7          Consultants: cardiology  Procedures performed: none   Disposition: Home Diet recommendation:  Cardiac and Carb modified diet DISCHARGE MEDICATION: Allergies as of 03/13/2023   No Known Allergies      Medication List     STOP taking these medications    Entresto 97-103 MG Generic drug: sacubitril-valsartan Replaced by: sacubitril-valsartan 24-26 MG   furosemide 40 MG tablet Commonly known as: LASIX   metFORMIN 1000 MG tablet Commonly known as: GLUCOPHAGE       TAKE these medications    empagliflozin 10 MG Tabs tablet Commonly known as: JARDIANCE Take 1 tablet (10 mg total) by mouth daily. Start taking on: March 14, 2023   metoprolol succinate 100 MG 24 hr tablet Commonly known as: TOPROL-XL Take 1 tablet (100 mg total) by mouth daily. Take with or immediately following a meal. Start taking on: March 14, 2023 What changed:  medication strength how much to take   rivaroxaban 20 MG Tabs tablet Commonly known  as: XARELTO Take 1 tablet (20 mg total) by mouth daily with supper.   rosuvastatin 10 MG tablet Commonly known as: CRESTOR Tome 1 tableta (10 mg en total) por va oral diariamente. (Take 1 tablet (10 mg total) by mouth daily.)   sacubitril-valsartan 24-26 MG Commonly known as: ENTRESTO Take 1 tablet by mouth 2 (two) times daily. Replaces: Entresto 97-103 MG   spironolactone 25 MG tablet Commonly known as: ALDACTONE Take 1 tablet (25 mg total) by mouth daily. Start taking on: March 14, 2023 What changed: how much to take   Torsemide 40 MG Tabs Take 40 mg by mouth daily.        Follow-up Information     [facility] Follow up on 03/26/2023.   Specialty: Cardiology Why: Advanced [facility] Failure [facility] 12 pm Contact information: [address] [id number] mc [address] [place] [address] [phone]        Grayce Sessions, NP. Go on 03/28/2023.   Specialty: Internal Medicine Why: @3 :30pm Contact information: [address] [place] [address] [phone]                Discharge Exam: Filed Weights   03/10/23 0905 03/11/23 0500 03/12/23 0424  Weight: 115.2 kg 131.5 kg 130 kg   BP Marland Kitchen)  142/101 (BP Location: Left Arm)   Pulse 90   Temp 97.9 F (36.6 C) (Oral)   Resp 19   Ht 5\' 6"  (1.676 m)   Wt 130 kg   SpO2 91%   BMI 46.26 kg/m   Patient is feeling back to normal with no dyspnea or chest pain, edema continue to improve.   Neurology awake and alert ENT with no pallor Cardiovascular with S1 and S2 present and rhythmic with no gallops, rubs or murmurs No JVD Trace lower extremity edema, non pitting Respiratory with no rales or wheezing, no rhonchi Abdomen with no distention   Condition at discharge: stable  The results of significant diagnostics from this hospitalization (including imaging, microbiology, ancillary and laboratory) are listed below for reference.   Imaging  Studies: ECHOCARDIOGRAM LIMITED  Result Date: 03/11/2023    ECHOCARDIOGRAM LIMITED REPORT   Patient Name:   JONAHS HECKMAN Date of Exam: 03/11/2023 Medical Rec #:  [id number]                      Height:       66.0 in Accession #:    [id number]                     Weight:       289.9 lb Date of Birth:  1980-09-16                      BSA:          2.342 m Patient Age:    42 years                       BP:           125/79 mmHg Patient Gender: M                              HR:           82 bpm. Exam Location:  Inpatient Procedure: Limited Echo, Cardiac Doppler, Limited Color Doppler and Intracardiac            Opacification Agent Indications:    CHF-Acute Systolic I50.21  History:        Patient has prior history of Echocardiogram examinations, most                 recent 11/17/2022. CHF, Alcohol abuse, Arrythmias:Atrial                 Fibrillation; Risk Factors:Hypertension, Diabetes and                 Non-Smoker.  Sonographer:    Aron Baba Referring Phys: [id number] Alessandra Bevels  Sonographer Comments: Image acquisition challenging due to patient body habitus. IMPRESSIONS  1. Left ventricular ejection fraction, by estimation, is 20 to 25%. The left ventricle has severely decreased function. The left ventricle demonstrates global hypokinesis. The left ventricular internal cavity size was mildly dilated. There is moderate concentric left ventricular hypertrophy. Left ventricular diastolic parameters are consistent with Grade II diastolic dysfunction (pseudonormalization).  2. Right ventricular systolic function is moderately reduced. The right ventricular size is moderately enlarged.  3. The mitral valve is grossly normal. Mild mitral valve regurgitation. No evidence of mitral stenosis.  4. The inferior vena cava is dilated in size with <50% respiratory variability, suggesting right atrial pressure of 15 mmHg. Comparison(s): No  significant change from prior study. FINDINGS  Left Ventricle: Left  ventricular ejection fraction, by estimation, is 20 to 25%. The left ventricle has severely decreased function. The left ventricle demonstrates global hypokinesis. Definity contrast agent was given IV to delineate the left ventricular endocardial borders. The left ventricular internal cavity size was mildly dilated. There is moderate concentric left ventricular hypertrophy. Left ventricular diastolic parameters are consistent with Grade II diastolic dysfunction (pseudonormalization). Right Ventricle: The right ventricular size is moderately enlarged. No increase in right ventricular wall thickness. Right ventricular systolic function is moderately reduced. Mitral Valve: The mitral valve is grossly normal. Mild mitral valve regurgitation. No evidence of mitral valve stenosis. Venous: The inferior vena cava is dilated in size with less than 50% respiratory variability, suggesting right atrial pressure of 15 mmHg. Additional Comments: Spectral Doppler performed. Color Doppler performed.  LEFT VENTRICLE PLAX 2D LVIDd:         5.70 cm LVIDs:         5.40 cm LV PW:         1.50 cm LV IVS:        1.40 cm  LEFT ATRIUM         Index LA diam:    5.60 cm 2.39 cm/m  MITRAL VALVE MV Area (PHT): 5.13 cm MV Decel Time: 148 msec MV E velocity: 83.80 cm/s MV A velocity: 58.20 cm/s MV E/A ratio:  1.44 Lennie Odor MD Electronically signed by Lennie Odor MD Signature Date/Time: 03/11/2023/11:10:59 AM    Final    DG Chest 2 View  Result Date: 03/10/2023 CLINICAL DATA:  42 year old male with history of shortness of breath and left leg swelling. EXAM: CHEST - 2 VIEW COMPARISON:  Chest x-ray 11/16/2022. FINDINGS: There is cephalization of the pulmonary vasculature, indistinctness of the interstitial markings, and patchy airspace disease throughout the lungs bilaterally suggestive of moderate pulmonary edema. Small bilateral pleural effusions. Moderate cardiomegaly. Upper mediastinal contours are within normal limits. IMPRESSION:  1. The appearance of the chest suggests moderate to severe congestive heart failure, as above. Electronically Signed   By: Trudie Reed M.D.   On: 03/10/2023 10:08    Microbiology: Results for orders placed or performed during the hospital encounter of 11/16/22  Resp panel by RT-PCR (RSV, Flu A&B, Covid) Anterior Nasal Swab     Status: None   Collection Time: 11/16/22  9:34 AM   Specimen: Anterior Nasal Swab  Result Value Ref Range Status   SARS Coronavirus 2 by RT PCR NEGATIVE NEGATIVE Final   Influenza A by PCR NEGATIVE NEGATIVE Final   Influenza B by PCR NEGATIVE NEGATIVE Final    Comment: (NOTE) The Xpert Xpress SARS-CoV-2/FLU/RSV plus assay is intended as an aid in the diagnosis of influenza from Nasopharyngeal swab specimens and should not be used as a sole basis for treatment. Nasal washings and aspirates are unacceptable for Xpert Xpress SARS-CoV-2/FLU/RSV testing.  Fact Sheet for Patients: BloggerCourse.com  Fact Sheet for Healthcare Providers: SeriousBroker.it  This test is not yet approved or cleared by the [place] FDA and has been authorized for detection and/or diagnosis of SARS-CoV-2 by FDA under an Emergency Use Authorization (EUA). This EUA will remain in effect (meaning this test can be used) for the duration of the COVID-19 declaration under Section 564(b)(1) of the Act, 21 U.S.C. section 360bbb-3(b)(1), unless the authorization is terminated or revoked.     Resp Syncytial Virus by PCR NEGATIVE NEGATIVE Final    Comment: (NOTE) Fact Sheet for Patients: BloggerCourse.com  Fact Sheet for Healthcare Providers: SeriousBroker.it  This test is not yet approved or cleared by the [place] FDA and has been authorized for detection and/or diagnosis of SARS-CoV-2 by FDA under an Emergency Use Authorization (EUA). This EUA will remain in effect (meaning  this test can be used) for the duration of the COVID-19 declaration under Section 564(b)(1) of the Act, 21 U.S.C. section 360bbb-3(b)(1), unless the authorization is terminated or revoked.  Performed at [facility], [address] [place], [place] [address]     Labs: CBC: Recent Labs  Lab 03/10/23 0918  WBC 8.0  NEUTROABS 6.1  HGB 15.3  HCT 48.9  MCV 94.4  PLT 267   Basic Metabolic Panel: Recent Labs  Lab 03/10/23 0918 03/11/23 0109 03/12/23 0054 03/13/23 0040  NA 135 140 140 134*  K 4.3 4.2 4.1 3.6  CL 98 94* 91* 91*  CO2 27 30 35* 36*  GLUCOSE 295* 185* 127* 169*  BUN 19 22* 26* 25*  CREATININE 1.60* 1.39* 1.55* 1.67*  CALCIUM 8.7* 9.3 9.2 8.9  MG  --   --  1.8 2.6*   Liver Function Tests: No results for input(s): "AST", "ALT", "ALKPHOS", "BILITOT", "PROT", "ALBUMIN" in the last 168 hours. CBG: Recent Labs  Lab 03/12/23 1148 03/12/23 1622 03/12/23 2144 03/13/23 0610 03/13/23 1109  GLUCAP 143* 142* 180* 142* 157*    Discharge time spent: greater than 30 minutes.  Signed: Coralie Keens, MD [facility] 03/13/2023

## 2023-03-13 NOTE — Progress Notes (Signed)
SATURATION QUALIFICATIONS: (This note is used to comply with regulatory documentation for home oxygen)  Patient Saturations on Room Air at Rest = 97%  Patient Saturations on Room Air while Ambulating = 83%  Patient Saturations on 3 Liters of oxygen while Ambulating = 94%  Please briefly explain why patient needs home oxygen: Pt desats after long hall walk

## 2023-03-14 ENCOUNTER — Telehealth (INDEPENDENT_AMBULATORY_CARE_PROVIDER_SITE_OTHER): Payer: Self-pay

## 2023-03-14 NOTE — Transitions of Care (Post Inpatient/ED Visit) (Signed)
   03/14/2023  Name: Justin [name] MRN: [id number] DOB: [birth year]  Today's TOC FU Call Status: Today's TOC FU Call Status:: Successful TOC FU Call Competed TOC FU Call Complete Date: 03/14/23  Transition Care Management Follow-up Telephone Call Date of Discharge: 03/13/23 Discharge Facility: [facility]) Type of Discharge: Inpatient Admission Primary Inpatient Discharge Diagnosis:: Acute on chronic systolic CHF (congestive heart failure) How have you been since you were released from the hospital?: Better Any questions or concerns?: No  Items Reviewed: Did you receive and understand the discharge instructions provided?: Yes Medications obtained,verified, and reconciled?: Yes (Medications Reviewed) Any new allergies since your discharge?: No Dietary orders reviewed?: Yes Do you have support at home?: Yes  Medications Reviewed Today: Medications Reviewed Today     Reviewed by Merleen Nicely, LPN (Licensed Practical Nurse) on 03/14/23 at 1040  Med List Status: <None>   Medication Order Taking? Sig Documenting Provider Last Dose Status Informant  empagliflozin (JARDIANCE) 10 MG TABS tablet [id number] Yes Take 1 tablet (10 mg total) by mouth daily. Arrien, York Ram, MD Taking Active   metoprolol succinate (TOPROL-XL) 100 MG 24 hr tablet [id number] Yes Take 1 tablet (100 mg total) by mouth daily. Take with or immediately following a meal. Arrien, York Ram, MD Taking Active   rivaroxaban (XARELTO) 20 MG TABS tablet [id number] Yes Take 1 tablet (20 mg total) by mouth daily with supper. Arrien, York Ram, MD Taking Active   rosuvastatin (CRESTOR) 10 MG tablet [id number] Yes Take 1 tablet (10 mg total) by mouth daily. Arrien, York Ram, MD Taking Active   sacubitril-valsartan [facility]) 24-26 MG [id number] Yes Take 1 tablet by mouth 2 (two) times daily. Arrien, York Ram, MD Taking Active   spironolactone (ALDACTONE) 25 MG tablet [id number] Yes  Take 1 tablet (25 mg total) by mouth daily. Arrien, York Ram, MD Taking Active   torsemide [facility]) 20 MG tablet [id number] Yes Take 2 tablets (40 mg total) by mouth daily. Arrien, York Ram, MD Taking Active             Home Care and Equipment/Supplies: Were Home Health Services Ordered?: No Any new equipment or medical supplies ordered?: No  Functional Questionnaire: Do you need assistance with bathing/showering or dressing?: No Do you need assistance with meal preparation?: No Do you need assistance with eating?: No Do you have difficulty maintaining continence: No Do you need assistance with getting out of bed/getting out of a chair/moving?: No Do you have difficulty managing or taking your medications?: No  Follow up appointments reviewed: PCP Follow-up appointment confirmed?: (S) No (no appts available- will ask office to call pt to schedule fu appt- the appt that is scheduled on 03-28-23 is outside the 14 day fu window- he needs appt by 03-27-23) MD Provider Line [phone] Given: Yes Follow-up Provider: Gwinda Passe [facility] Follow-up appointment confirmed?: No Do you need transportation to your follow-up appointment?: No Do you understand care options if your condition(s) worsen?: Yes-patient verbalized understanding    SIGNATURE  Woodfin Ganja LPN [facility] Health Advisor Direct Dial [phone]

## 2023-03-23 ENCOUNTER — Telehealth (HOSPITAL_COMMUNITY): Payer: Self-pay

## 2023-03-23 NOTE — Telephone Encounter (Signed)
Called to confirm/remind patient of their appointment at the [facility] on 03/26/23.   Patient reminded to bring all medications and/or complete list.  Confirmed patient has transportation. Gave directions, instructed to utilize valet parking.  Confirmed appointment prior to ending call.

## 2023-03-26 ENCOUNTER — Encounter (HOSPITAL_COMMUNITY): Payer: Self-pay

## 2023-03-26 ENCOUNTER — Telehealth (HOSPITAL_COMMUNITY): Payer: Self-pay | Admitting: Licensed Clinical Social Worker

## 2023-03-26 NOTE — Telephone Encounter (Signed)
CSW attempted to contact patient to follow up on income letter needed for [facility] and reminder of today's appointment in the HF clinic. Unable to leave message as message states wireless customer not available. Lasandra Beech, LCSW, CCSW-MCS [phone]

## 2023-03-28 ENCOUNTER — Inpatient Hospital Stay (INDEPENDENT_AMBULATORY_CARE_PROVIDER_SITE_OTHER): Payer: Self-pay | Admitting: Primary Care

## 2023-08-22 ENCOUNTER — Other Ambulatory Visit: Payer: Self-pay

## 2023-08-22 ENCOUNTER — Inpatient Hospital Stay (HOSPITAL_COMMUNITY)
Admission: EM | Admit: 2023-08-22 | Discharge: 2023-08-25 | DRG: 291 | Disposition: A | Discharge status: Home / Self Care | Payer: MEDICAID | Attending: Internal Medicine | Admitting: Internal Medicine

## 2023-08-22 ENCOUNTER — Encounter (HOSPITAL_COMMUNITY): Payer: Self-pay

## 2023-08-22 ENCOUNTER — Emergency Department (HOSPITAL_COMMUNITY): Payer: MEDICAID

## 2023-08-22 DIAGNOSIS — I5043 Acute on chronic combined systolic (congestive) and diastolic (congestive) heart failure: Secondary | ICD-10-CM | POA: Diagnosis present

## 2023-08-22 DIAGNOSIS — Z5971 Insufficient health insurance coverage: Secondary | ICD-10-CM | POA: Diagnosis not present

## 2023-08-22 DIAGNOSIS — Z7901 Long term (current) use of anticoagulants: Secondary | ICD-10-CM | POA: Diagnosis not present

## 2023-08-22 DIAGNOSIS — I340 Nonrheumatic mitral (valve) insufficiency: Secondary | ICD-10-CM | POA: Diagnosis present

## 2023-08-22 DIAGNOSIS — Z91148 Patient's other noncompliance with medication regimen for other reason: Secondary | ICD-10-CM

## 2023-08-22 DIAGNOSIS — I160 Hypertensive urgency: Secondary | ICD-10-CM | POA: Diagnosis present

## 2023-08-22 DIAGNOSIS — Z1152 Encounter for screening for COVID-19: Secondary | ICD-10-CM | POA: Diagnosis not present

## 2023-08-22 DIAGNOSIS — N1831 Chronic kidney disease, stage 3a: Secondary | ICD-10-CM | POA: Diagnosis present

## 2023-08-22 DIAGNOSIS — J9621 Acute and chronic respiratory failure with hypoxia: Secondary | ICD-10-CM | POA: Diagnosis present

## 2023-08-22 DIAGNOSIS — Z5986 Financial insecurity: Secondary | ICD-10-CM

## 2023-08-22 DIAGNOSIS — Z79899 Other long term (current) drug therapy: Secondary | ICD-10-CM

## 2023-08-22 DIAGNOSIS — Z555 Less than a high school diploma: Secondary | ICD-10-CM

## 2023-08-22 DIAGNOSIS — I480 Paroxysmal atrial fibrillation: Secondary | ICD-10-CM | POA: Diagnosis present

## 2023-08-22 DIAGNOSIS — E1122 Type 2 diabetes mellitus with diabetic chronic kidney disease: Secondary | ICD-10-CM | POA: Diagnosis present

## 2023-08-22 DIAGNOSIS — I509 Heart failure, unspecified: Secondary | ICD-10-CM

## 2023-08-22 DIAGNOSIS — I5082 Biventricular heart failure: Secondary | ICD-10-CM | POA: Diagnosis present

## 2023-08-22 DIAGNOSIS — I130 Hypertensive heart and chronic kidney disease with heart failure and stage 1 through stage 4 chronic kidney disease, or unspecified chronic kidney disease: Secondary | ICD-10-CM | POA: Diagnosis present

## 2023-08-22 DIAGNOSIS — E781 Pure hyperglyceridemia: Secondary | ICD-10-CM | POA: Diagnosis present

## 2023-08-22 DIAGNOSIS — Z91199 Patient's noncompliance with other medical treatment and regimen due to unspecified reason: Secondary | ICD-10-CM

## 2023-08-22 DIAGNOSIS — I5042 Chronic combined systolic (congestive) and diastolic (congestive) heart failure: Secondary | ICD-10-CM

## 2023-08-22 DIAGNOSIS — R0602 Shortness of breath: Secondary | ICD-10-CM | POA: Diagnosis present

## 2023-08-22 DIAGNOSIS — I447 Left bundle-branch block, unspecified: Secondary | ICD-10-CM | POA: Diagnosis present

## 2023-08-22 DIAGNOSIS — Z7984 Long term (current) use of oral hypoglycemic drugs: Secondary | ICD-10-CM

## 2023-08-22 DIAGNOSIS — E669 Obesity, unspecified: Secondary | ICD-10-CM | POA: Diagnosis present

## 2023-08-22 DIAGNOSIS — F1010 Alcohol abuse, uncomplicated: Secondary | ICD-10-CM | POA: Diagnosis present

## 2023-08-22 DIAGNOSIS — I5023 Acute on chronic systolic (congestive) heart failure: Secondary | ICD-10-CM | POA: Diagnosis present

## 2023-08-22 DIAGNOSIS — Z5982 Transportation insecurity: Secondary | ICD-10-CM

## 2023-08-22 DIAGNOSIS — I428 Other cardiomyopathies: Secondary | ICD-10-CM | POA: Diagnosis present

## 2023-08-22 DIAGNOSIS — Z6839 Body mass index (BMI) 39.0-39.9, adult: Secondary | ICD-10-CM

## 2023-08-22 DIAGNOSIS — Z8249 Family history of ischemic heart disease and other diseases of the circulatory system: Secondary | ICD-10-CM

## 2023-08-22 DIAGNOSIS — F1410 Cocaine abuse, uncomplicated: Secondary | ICD-10-CM | POA: Diagnosis present

## 2023-08-22 DIAGNOSIS — I5022 Chronic systolic (congestive) heart failure: Secondary | ICD-10-CM | POA: Diagnosis present

## 2023-08-22 LAB — BASIC METABOLIC PANEL
Anion gap: 5 (ref 5–15)
BUN: 18 mg/dL (ref 6–20)
CO2: 26 mmol/L (ref 22–32)
Calcium: 8 mg/dL — ABNORMAL LOW (ref 8.9–10.3)
Chloride: 106 mmol/L (ref 98–111)
Creatinine, Ser: 1.53 mg/dL — ABNORMAL HIGH (ref 0.61–1.24)
GFR, Estimated: 57 mL/min — ABNORMAL LOW (ref >60)
Glucose, Bld: 245 mg/dL — ABNORMAL HIGH (ref 70–99)
Potassium: 3.9 mmol/L (ref 3.5–5.1)
Sodium: 137 mmol/L (ref 135–145)

## 2023-08-22 LAB — COMPREHENSIVE METABOLIC PANEL
ALT: 19 U/L (ref 0–44)
AST: 22 U/L (ref 15–41)
Albumin: 2.9 g/dL — ABNORMAL LOW (ref 3.5–5.0)
Alkaline Phosphatase: 99 U/L (ref 38–126)
Anion gap: 8 (ref 5–15)
BUN: 19 mg/dL (ref 6–20)
CO2: 29 mmol/L (ref 22–32)
Calcium: 9.2 mg/dL (ref 8.9–10.3)
Chloride: 96 mmol/L — ABNORMAL LOW (ref 98–111)
Creatinine, Ser: 1.47 mg/dL — ABNORMAL HIGH (ref 0.61–1.24)
GFR, Estimated: >60 mL/min (ref >60)
Glucose, Bld: 291 mg/dL — ABNORMAL HIGH (ref 70–99)
Potassium: 4.6 mmol/L (ref 3.5–5.1)
Sodium: 133 mmol/L — ABNORMAL LOW (ref 135–145)
Total Bilirubin: 1.3 mg/dL — ABNORMAL HIGH (ref <1.2)
Total Protein: 6.9 g/dL (ref 6.5–8.1)

## 2023-08-22 LAB — COOXEMETRY PANEL
Carboxyhemoglobin: 2.2 % — ABNORMAL HIGH (ref 0.5–1.5)
Carboxyhemoglobin: 2.1 % — ABNORMAL HIGH (ref 0.5–1.5)
Methemoglobin: <0.7 % (ref 0.0–1.5)
Methemoglobin: <0.7 % (ref 0.0–1.5)
O2 Saturation: 61 %
O2 Saturation: 50.4 %
Total hemoglobin: 17 g/dL — ABNORMAL HIGH (ref 12.0–16.0)
Total hemoglobin: 18.1 g/dL — ABNORMAL HIGH (ref 12.0–16.0)

## 2023-08-22 LAB — I-STAT ARTERIAL BLOOD GAS, ED
Acid-Base Excess: 4 mmol/L — ABNORMAL HIGH (ref 0.0–2.0)
Bicarbonate: 30.8 mmol/L — ABNORMAL HIGH (ref 20.0–28.0)
Calcium, Ion: 1.27 mmol/L (ref 1.15–1.40)
HCT: 47 % (ref 39.0–52.0)
Hemoglobin: 16 g/dL (ref 13.0–17.0)
O2 Saturation: 98 %
Potassium: 4 mmol/L (ref 3.5–5.1)
Sodium: 135 mmol/L (ref 135–145)
TCO2: 32 mmol/L (ref 22–32)
pCO2 arterial: 53.8 mm[Hg] — ABNORMAL HIGH (ref 32–48)
pH, Arterial: 7.366 (ref 7.35–7.45)
pO2, Arterial: 104 mm[Hg] (ref 83–108)

## 2023-08-22 LAB — CBC WITH DIFFERENTIAL/PLATELET
Abs Immature Granulocytes: 0.07 10*3/uL (ref 0.00–0.07)
Basophils Absolute: 0.1 10*3/uL (ref 0.0–0.1)
Basophils Relative: 1 %
Eosinophils Absolute: 0.2 10*3/uL (ref 0.0–0.5)
Eosinophils Relative: 2 %
HCT: 49.8 % (ref 39.0–52.0)
Hemoglobin: 16.5 g/dL (ref 13.0–17.0)
Immature Granulocytes: 1 %
Lymphocytes Relative: 12 %
Lymphs Abs: 1.2 10*3/uL (ref 0.7–4.0)
MCH: 30 pg (ref 26.0–34.0)
MCHC: 33.1 g/dL (ref 30.0–36.0)
MCV: 90.5 fL (ref 80.0–100.0)
Monocytes Absolute: 0.7 10*3/uL (ref 0.1–1.0)
Monocytes Relative: 7 %
Neutro Abs: 7.6 10*3/uL (ref 1.7–7.7)
Neutrophils Relative %: 77 %
Platelets: 313 10*3/uL (ref 150–400)
RBC: 5.5 MIL/uL (ref 4.22–5.81)
RDW: 13 % (ref 11.5–15.5)
WBC: 9.8 10*3/uL (ref 4.0–10.5)
nRBC: 0 % (ref 0.0–0.2)

## 2023-08-22 LAB — GLUCOSE, CAPILLARY
Glucose-Capillary: 268 mg/dL — ABNORMAL HIGH (ref 70–99)
Glucose-Capillary: 225 mg/dL — ABNORMAL HIGH (ref 70–99)

## 2023-08-22 LAB — I-STAT VENOUS BLOOD GAS, ED
Acid-Base Excess: 3 mmol/L — ABNORMAL HIGH (ref 0.0–2.0)
Bicarbonate: 30.2 mmol/L — ABNORMAL HIGH (ref 20.0–28.0)
Calcium, Ion: 1.16 mmol/L (ref 1.15–1.40)
HCT: 49 % (ref 39.0–52.0)
Hemoglobin: 16.7 g/dL (ref 13.0–17.0)
O2 Saturation: 75 %
Potassium: 4.1 mmol/L (ref 3.5–5.1)
Sodium: 136 mmol/L (ref 135–145)
TCO2: 32 mmol/L (ref 22–32)
pCO2, Ven: 52 mm[Hg] (ref 44–60)
pH, Ven: 7.372 (ref 7.25–7.43)
pO2, Ven: 42 mm[Hg] (ref 32–45)

## 2023-08-22 LAB — I-STAT CHEM 8, ED
BUN: 22 mg/dL — ABNORMAL HIGH (ref 6–20)
Calcium, Ion: 1.13 mmol/L — ABNORMAL LOW (ref 1.15–1.40)
Chloride: 99 mmol/L (ref 98–111)
Creatinine, Ser: 1.5 mg/dL — ABNORMAL HIGH (ref 0.61–1.24)
Glucose, Bld: 267 mg/dL — ABNORMAL HIGH (ref 70–99)
HCT: 49 % (ref 39.0–52.0)
Hemoglobin: 16.7 g/dL (ref 13.0–17.0)
Potassium: 4.2 mmol/L (ref 3.5–5.1)
Sodium: 137 mmol/L (ref 135–145)
TCO2: 28 mmol/L (ref 22–32)

## 2023-08-22 LAB — MRSA NEXT GEN BY PCR, NASAL: MRSA by PCR Next Gen: NOT DETECTED

## 2023-08-22 LAB — HEPARIN LEVEL (UNFRACTIONATED): Heparin Unfractionated: <0.1 [IU]/mL — ABNORMAL LOW (ref 0.30–0.70)

## 2023-08-22 LAB — RAPID URINE DRUG SCREEN, HOSP PERFORMED
Amphetamines: NOT DETECTED
Barbiturates: NOT DETECTED
Benzodiazepines: NOT DETECTED
Cocaine: NOT DETECTED
Opiates: NOT DETECTED
Tetrahydrocannabinol: NOT DETECTED

## 2023-08-22 LAB — RESP PANEL BY RT-PCR (RSV, FLU A&B, COVID)  RVPGX2
Influenza A by PCR: NEGATIVE
Influenza B by PCR: NEGATIVE
Resp Syncytial Virus by PCR: NEGATIVE
SARS Coronavirus 2 by RT PCR: NEGATIVE

## 2023-08-22 LAB — BRAIN NATRIURETIC PEPTIDE: B Natriuretic Peptide: 951.5 pg/mL — ABNORMAL HIGH (ref 0.0–100.0)

## 2023-08-22 LAB — I-STAT CG4 LACTIC ACID, ED: Lactic Acid, Venous: 1.2 mmol/L (ref 0.5–1.9)

## 2023-08-22 LAB — TROPONIN I (HIGH SENSITIVITY)
Troponin I (High Sensitivity): 111 ng/L (ref <18)
Troponin I (High Sensitivity): 126 ng/L (ref <18)
Troponin I (High Sensitivity): 96 ng/L — ABNORMAL HIGH (ref <18)

## 2023-08-22 LAB — MAGNESIUM: Magnesium: 2 mg/dL (ref 1.7–2.4)

## 2023-08-22 MED ORDER — ORAL CARE MOUTH RINSE: 15.0000 mL | OROMUCOSAL | Status: DC | PRN

## 2023-08-22 MED ORDER — DIGOXIN 125 MCG PO TABS
0.1250 mg | ORAL_TABLET | Freq: Every day | ORAL | Status: DC
Start: 2023-08-22 — End: 2023-08-25
  Administered 2023-08-22 – 2023-08-25 (×4): 0.125 mg via ORAL
  Filled 2023-08-22 (×4): qty 1

## 2023-08-22 MED ORDER — FUROSEMIDE 10 MG/ML IJ SOLN
60.0000 mg | Freq: Once | INTRAMUSCULAR | Status: AC
  Administered 2023-08-22: 60 mg via INTRAVENOUS
  Filled 2023-08-22: qty 6

## 2023-08-22 MED ORDER — HEPARIN BOLUS VIA INFUSION
3000.0000 [IU] | Freq: Once | INTRAVENOUS | Status: AC
Start: 2023-08-22 — End: 2023-08-22
  Administered 2023-08-22: 3000 [IU] via INTRAVENOUS
  Filled 2023-08-22: qty 3000

## 2023-08-22 MED ORDER — HEPARIN (PORCINE) 25000 UT/250ML-% IV SOLN
1950.0000 [IU]/h | INTRAVENOUS | Status: DC
  Administered 2023-08-22: 1400 [IU]/h via INTRAVENOUS
  Administered 2023-08-23: 1800 [IU]/h via INTRAVENOUS
  Filled 2023-08-22 (×2): qty 250

## 2023-08-22 MED ORDER — SODIUM CHLORIDE 0.9% FLUSH: 10.0000 mL | INTRAVENOUS | Status: DC | PRN

## 2023-08-22 MED ORDER — SODIUM CHLORIDE 0.9% FLUSH
10.0000 mL | Freq: Two times a day (BID) | INTRAVENOUS | Status: DC
  Administered 2023-08-22: 10 mL
  Administered 2023-08-23: 20 mL
  Administered 2023-08-23 – 2023-08-25 (×4): 10 mL

## 2023-08-22 MED ORDER — INSULIN ASPART 100 UNIT/ML IJ SOLN
0.0000 [IU] | Freq: Three times a day (TID) | INTRAMUSCULAR | Status: DC
  Administered 2023-08-22: 8 [IU] via SUBCUTANEOUS
  Administered 2023-08-23: 5 [IU] via SUBCUTANEOUS
  Administered 2023-08-23: 2 [IU] via SUBCUTANEOUS
  Administered 2023-08-23: 3 [IU] via SUBCUTANEOUS
  Administered 2023-08-24: 5 [IU] via SUBCUTANEOUS
  Administered 2023-08-24: 8 [IU] via SUBCUTANEOUS
  Administered 2023-08-24: 5 [IU] via SUBCUTANEOUS
  Administered 2023-08-25: 8 [IU] via SUBCUTANEOUS
  Administered 2023-08-25: 3 [IU] via SUBCUTANEOUS

## 2023-08-22 MED ORDER — CHLORHEXIDINE GLUCONATE CLOTH 2 % EX PADS
6.0000 | MEDICATED_PAD | Freq: Every day | CUTANEOUS | Status: DC
  Administered 2023-08-22 – 2023-08-25 (×4): 6 via TOPICAL

## 2023-08-22 MED ORDER — FUROSEMIDE 10 MG/ML IJ SOLN
80.0000 mg | Freq: Two times a day (BID) | INTRAMUSCULAR | Status: AC
Start: 2023-08-22 — End: 2023-08-23
  Administered 2023-08-22 – 2023-08-23 (×2): 80 mg via INTRAVENOUS
  Filled 2023-08-22 (×2): qty 8

## 2023-08-22 MED ORDER — ENOXAPARIN SODIUM 40 MG/0.4ML IJ SOSY: 40.0000 mg | PREFILLED_SYRINGE | INTRAMUSCULAR | Status: DC

## 2023-08-22 MED ORDER — SACUBITRIL-VALSARTAN 24-26 MG PO TABS
1.0000 | ORAL_TABLET | Freq: Two times a day (BID) | ORAL | Status: DC
  Administered 2023-08-22 (×2): 1 via ORAL
  Filled 2023-08-22 (×2): qty 1

## 2023-08-22 MED ORDER — ROSUVASTATIN CALCIUM 5 MG PO TABS
10.0000 mg | ORAL_TABLET | Freq: Every day | ORAL | Status: DC
  Administered 2023-08-23 – 2023-08-25 (×3): 10 mg via ORAL
  Filled 2023-08-22 (×4): qty 2

## 2023-08-22 MED ORDER — SPIRONOLACTONE 12.5 MG HALF TABLET
12.5000 mg | ORAL_TABLET | Freq: Every day | ORAL | Status: DC
Start: 2023-08-22 — End: 2023-08-24
  Administered 2023-08-22 – 2023-08-24 (×3): 12.5 mg via ORAL
  Filled 2023-08-22 (×3): qty 1

## 2023-08-22 MED ORDER — BISMUTH SUBSALICYLATE 262 MG PO CHEW
524.0000 mg | CHEWABLE_TABLET | Freq: Four times a day (QID) | ORAL | Status: DC | PRN
  Administered 2023-08-22 – 2023-08-25 (×2): 524 mg via ORAL
  Filled 2023-08-22 (×3): qty 2

## 2023-08-22 MED ORDER — FUROSEMIDE 10 MG/ML IJ SOLN: 80.0000 mg | Freq: Once | INTRAMUSCULAR | Status: DC

## 2023-08-22 MED ORDER — METOLAZONE 5 MG PO TABS
5.0000 mg | ORAL_TABLET | Freq: Once | ORAL | Status: AC
  Administered 2023-08-22: 5 mg via ORAL
  Filled 2023-08-22: qty 1

## 2023-08-22 MED ORDER — NITROGLYCERIN IN D5W 200-5 MCG/ML-% IV SOLN
0.0000 ug/min | INTRAVENOUS | Status: DC
  Administered 2023-08-22: 5 ug/min via INTRAVENOUS
  Filled 2023-08-22: qty 250

## 2023-08-22 MED ORDER — HEPARIN BOLUS VIA INFUSION
4000.0000 [IU] | Freq: Once | INTRAVENOUS | Status: AC
  Administered 2023-08-22: 4000 [IU] via INTRAVENOUS
  Filled 2023-08-22: qty 4000

## 2023-08-22 MED ORDER — ACETAMINOPHEN 325 MG PO TABS: 650.0000 mg | ORAL_TABLET | ORAL | Status: DC | PRN

## 2023-08-22 MED ORDER — SODIUM CHLORIDE 0.9 % IV SOLN: 250.0000 mL | INTRAVENOUS | Status: AC | PRN

## 2023-08-22 MED ORDER — SODIUM CHLORIDE 0.9% FLUSH: 3.0000 mL | INTRAVENOUS | Status: DC | PRN

## 2023-08-22 MED ORDER — EMPAGLIFLOZIN 10 MG PO TABS
10.0000 mg | ORAL_TABLET | Freq: Every day | ORAL | Status: DC
  Administered 2023-08-22 – 2023-08-25 (×4): 10 mg via ORAL
  Filled 2023-08-22 (×4): qty 1

## 2023-08-22 MED ORDER — SODIUM CHLORIDE 0.9% FLUSH
3.0000 mL | Freq: Two times a day (BID) | INTRAVENOUS | Status: DC
  Administered 2023-08-22 – 2023-08-25 (×7): 3 mL via INTRAVENOUS

## 2023-08-22 NOTE — ED Triage Notes (Signed)
Pt arrived via ems to the er for the c/o sob this morning. Pt has a hx chf and home 02 but does not use the home 02 or take medication prescribed. Pt was 83% RA, work of breathing continually increased, r33, pt eventually placed on cpap with ems. 98% on cpap, one sublingual nitroglycerin given by ems.

## 2023-08-22 NOTE — ED Notes (Signed)
ED TO INPATIENT HANDOFF REPORT  ED Nurse Name and Phone #:   S Name/Age/Gender Justin [name] 42 y.o. male Room/Bed: 007C/007C  Code Status   Code Status: Full Code  Home/SNF/Other Home Patient oriented to: self, place, time, and situation Is this baseline? No   Triage Complete: Triage complete  Chief Complaint Acute exacerbation of CHF (congestive heart failure) (HCC) [I50.9]  Triage Note Pt arrived via ems to the er for the c/o sob this morning. Pt has a hx chf and home 02 but does not use the home 02 or take medication prescribed. Pt was 83% RA, work of breathing continually increased, r33, pt eventually placed on cpap with ems. 98% on cpap, one sublingual nitroglycerin given by ems.    Allergies No Known Allergies  Level of Care/Admitting Diagnosis ED Disposition     ED Disposition  Admit   Condition  --   Comment  Hospital Area: [facility] [[id number]]  Level of Care: Telemetry Medical [104]  May admit [facility] to [facility] or [facility] if equivalent level of care is available:: No  Covid Evaluation: Asymptomatic - no recent exposure (last 10 days) testing not required  Diagnosis: Acute exacerbation of CHF (congestive heart failure) [facility]) [[id number]]  Admitting Physician: Mercie Eon [[id number]]  Attending Physician: Mercie Eon [[id number]]  Certification:: I certify this patient will need inpatient services for at least 2 midnights  Expected Medical Readiness: 08/25/2023          B Medical/Surgery History Past Medical History:  Diagnosis Date   Heart failure with reduced ejection fraction (HCC) 2017   [facility]   Hypertension 2017   [facility]   Past Surgical History:  Procedure Laterality Date   RIGHT/LEFT HEART CATH AND CORONARY ANGIOGRAPHY N/A 02/16/2022   Procedure: RIGHT/LEFT HEART CATH AND CORONARY ANGIOGRAPHY;  Surgeon: Tonny Bollman, MD;  Location: [facility] INVASIVE CV LAB;  Service:  Cardiovascular;  Laterality: N/A;     A IV Location/Drains/Wounds Patient Lines/Drains/Airways Status     Active Line/Drains/Airways     Name Placement date Placement time Site Days   Peripheral IV 03/10/23 20 G 1" Anterior;Distal;Right;Upper Arm 03/10/23  0947  Arm  165   Peripheral IV 08/22/23 18 G Left Antecubital 08/22/23  --  Antecubital  less than 1   Peripheral IV 08/22/23 20 G Posterior;Right Hand 08/22/23  1153  Hand  less than 1            Intake/Output Last 24 hours No intake or output data in the 24 hours ending 08/22/23 1243  Labs/Imaging Results for orders placed or performed during the hospital encounter of 08/22/23 (from the past 48 hour(s))  Basic metabolic panel     Status: Abnormal   Collection Time: 08/22/23  7:22 AM  Result Value Ref Range   Sodium 137 135 - 145 mmol/L   Potassium 3.9 3.5 - 5.1 mmol/L   Chloride 106 98 - 111 mmol/L   CO2 26 22 - 32 mmol/L   Glucose, Bld 245 (H) 70 - 99 mg/dL    Comment: Glucose reference range applies only to samples taken after fasting for at least 8 hours.   BUN 18 6 - 20 mg/dL   Creatinine, Ser 2.95 (H) 0.61 - 1.24 mg/dL   Calcium 8.0 (L) 8.9 - 10.3 mg/dL   GFR, Estimated 57 (L) >60 mL/min    Comment: (NOTE) Calculated using the CKD-EPI Creatinine Equation (2021)    Anion gap 5 5 -  15    Comment: Performed at [facility], [address], [place], [place] [address]  CBC with Differential     Status: None   Collection Time: 08/22/23  7:22 AM  Result Value Ref Range   WBC 9.8 4.0 - 10.5 K/uL   RBC 5.50 4.22 - 5.81 MIL/uL   Hemoglobin 16.5 13.0 - 17.0 g/dL   HCT 60.4 54.0 - 98.1 %   MCV 90.5 80.0 - 100.0 fL   MCH 30.0 26.0 - 34.0 pg   MCHC 33.1 30.0 - 36.0 g/dL   RDW 19.1 47.8 - 29.5 %   Platelets 313 150 - 400 K/uL   nRBC 0.0 0.0 - 0.2 %   Neutrophils Relative % 77 %   Neutro Abs 7.6 1.7 - 7.7 K/uL   Lymphocytes Relative 12 %   Lymphs Abs 1.2 0.7 - 4.0 K/uL   Monocytes Relative 7 %   Monocytes  Absolute 0.7 0.1 - 1.0 K/uL   Eosinophils Relative 2 %   Eosinophils Absolute 0.2 0.0 - 0.5 K/uL   Basophils Relative 1 %   Basophils Absolute 0.1 0.0 - 0.1 K/uL   Immature Granulocytes 1 %   Abs Immature Granulocytes 0.07 0.00 - 0.07 K/uL    Comment: Performed at [facility], [address], [place], [place] [address]  Brain natriuretic peptide     Status: Abnormal   Collection Time: 08/22/23  7:22 AM  Result Value Ref Range   B Natriuretic Peptide 951.5 (H) 0.0 - 100.0 pg/mL    Comment: Performed at [facility], [address] [place], [place] [address]  Troponin I (High Sensitivity)     Status: Abnormal   Collection Time: 08/22/23  7:22 AM  Result Value Ref Range   Troponin I (High Sensitivity) 111 (HH) <18 ng/L    Comment: CRITICAL RESULT CALLED TO, READ BACK BY AND VERIFIED WITH G. Hyacinth Meeker, RN [phone] 08/22/23 L. KLAR (NOTE) Elevated high sensitivity troponin I (hsTnI) values and significant  changes across serial measurements may suggest ACS but many other  chronic and acute conditions are known to elevate hsTnI results.  Refer to the "Links" section for chest pain algorithms and additional  guidance. Performed at [facility], [address], [place], [place] [address]   Resp panel by RT-PCR (RSV, Flu A&B, Covid) Anterior Nasal Swab     Status: None   Collection Time: 08/22/23  7:34 AM   Specimen: Anterior Nasal Swab  Result Value Ref Range   SARS Coronavirus 2 by RT PCR NEGATIVE NEGATIVE   Influenza A by PCR NEGATIVE NEGATIVE   Influenza B by PCR NEGATIVE NEGATIVE    Comment: (NOTE) The Xpert Xpress SARS-CoV-2/FLU/RSV plus assay is intended as an aid in the diagnosis of influenza from Nasopharyngeal swab specimens and should not be used as a sole basis for treatment. Nasal washings and aspirates are unacceptable for Xpert Xpress SARS-CoV-2/FLU/RSV testing.  Fact Sheet for Patients: BloggerCourse.com  Fact Sheet for  Healthcare Providers: SeriousBroker.it  This test is not yet approved or cleared by the [place] FDA and has been authorized for detection and/or diagnosis of SARS-CoV-2 by FDA under an Emergency Use Authorization (EUA). This EUA will remain in effect (meaning this test can be used) for the duration of the COVID-19 declaration under Section 564(b)(1) of the Act, 21 U.S.C. section 360bbb-3(b)(1), unless the authorization is terminated or revoked.     Resp Syncytial Virus by PCR NEGATIVE NEGATIVE  Comment: (NOTE) Fact Sheet for Patients: BloggerCourse.com  Fact Sheet for Healthcare Providers: SeriousBroker.it  This test is not yet approved or cleared by the [place] FDA and has been authorized for detection and/or diagnosis of SARS-CoV-2 by FDA under an Emergency Use Authorization (EUA). This EUA will remain in effect (meaning this test can be used) for the duration of the COVID-19 declaration under Section 564(b)(1) of the Act, 21 U.S.C. section 360bbb-3(b)(1), unless the authorization is terminated or revoked.  Performed at [facility], [address], [place], [place] [address]   I-Stat venous blood gas, [facility], [facility], [facility])     Status: Abnormal   Collection Time: 08/22/23  7:35 AM  Result Value Ref Range   pH, Ven 7.372 7.25 - 7.43   pCO2, Ven 52.0 44 - 60 mmHg   pO2, Ven 42 32 - 45 mmHg   Bicarbonate 30.2 (H) 20.0 - 28.0 mmol/L   TCO2 32 22 - 32 mmol/L   O2 Saturation 75 %   Acid-Base Excess 3.0 (H) 0.0 - 2.0 mmol/L   Sodium 136 135 - 145 mmol/L   Potassium 4.1 3.5 - 5.1 mmol/L   Calcium, Ion 1.16 1.15 - 1.40 mmol/L   HCT 49.0 39.0 - 52.0 %   Hemoglobin 16.7 13.0 - 17.0 g/dL   Sample type VENOUS   I-stat chem 8, ED (not at [facility], [facility] or [facility])     Status: Abnormal   Collection Time: 08/22/23  7:35 AM  Result Value Ref Range   Sodium 137 135 - 145 mmol/L   Potassium 4.2 3.5 -  5.1 mmol/L   Chloride 99 98 - 111 mmol/L   BUN 22 (H) 6 - 20 mg/dL   Creatinine, Ser 3.47 (H) 0.61 - 1.24 mg/dL   Glucose, Bld 425 (H) 70 - 99 mg/dL    Comment: Glucose reference range applies only to samples taken after fasting for at least 8 hours.   Calcium, Ion 1.13 (L) 1.15 - 1.40 mmol/L   TCO2 28 22 - 32 mmol/L   Hemoglobin 16.7 13.0 - 17.0 g/dL   HCT 95.6 38.7 - 56.4 %  I-Stat CG4 Lactic Acid     Status: None   Collection Time: 08/22/23  7:36 AM  Result Value Ref Range   Lactic Acid, Venous 1.2 0.5 - 1.9 mmol/L  I-Stat arterial blood gas, ED ([facility], [facility], [facility])     Status: Abnormal   Collection Time: 08/22/23  9:10 AM  Result Value Ref Range   pH, Arterial 7.366 7.35 - 7.45   pCO2 arterial 53.8 (H) 32 - 48 mmHg   pO2, Arterial 104 83 - 108 mmHg   Bicarbonate 30.8 (H) 20.0 - 28.0 mmol/L   TCO2 32 22 - 32 mmol/L   O2 Saturation 98 %   Acid-Base Excess 4.0 (H) 0.0 - 2.0 mmol/L   Sodium 135 135 - 145 mmol/L   Potassium 4.0 3.5 - 5.1 mmol/L   Calcium, Ion 1.27 1.15 - 1.40 mmol/L   HCT 47.0 39.0 - 52.0 %   Hemoglobin 16.0 13.0 - 17.0 g/dL   Collection site RADIAL, ALLEN'S TEST ACCEPTABLE    Drawn by RT    Sample type ARTERIAL   Comprehensive metabolic panel     Status: Abnormal   Collection Time: 08/22/23  9:38 AM  Result Value Ref Range   Sodium 133 (L) 135 - 145 mmol/L   Potassium 4.6 3.5 - 5.1 mmol/L   Chloride 96 (L) 98 -  111 mmol/L   CO2 29 22 - 32 mmol/L   Glucose, Bld 291 (H) 70 - 99 mg/dL    Comment: Glucose reference range applies only to samples taken after fasting for at least 8 hours.   BUN 19 6 - 20 mg/dL   Creatinine, Ser 2.53 (H) 0.61 - 1.24 mg/dL   Calcium 9.2 8.9 - 66.4 mg/dL   Total Protein 6.9 6.5 - 8.1 g/dL   Albumin 2.9 (L) 3.5 - 5.0 g/dL   AST 22 15 - 41 U/L   ALT 19 0 - 44 U/L   Alkaline Phosphatase 99 38 - 126 U/L   Total Bilirubin 1.3 (H) <1.2 mg/dL   GFR, Estimated >40 >34 mL/min    Comment: (NOTE) Calculated using the CKD-EPI Creatinine  Equation (2021)    Anion gap 8 5 - 15    Comment: Performed at [facility], [address] [place], [place] [address]   [place] EKG SITE RITE  Result Date: 08/22/2023 If [facility] image not attached, placement could not be confirmed due to current cardiac rhythm.  DG Chest Port 1 View  Result Date: 08/22/2023 CLINICAL DATA:  Dyspnea. EXAM: PORTABLE CHEST 1 VIEW COMPARISON:  03/10/2023. FINDINGS: There are increased interstitial markings with bilateral hilar and bibasilar predominance. Findings favor moderate congestive heart failure/pulmonary edema. There are probable bilateral small layering pleural effusions. There are probable atelectatic changes at the lung bases. No pneumothorax. Stable mildly enlarged cardio-mediastinal silhouette. No acute osseous abnormalities. The soft tissues are within normal limits. IMPRESSION: *Findings favor congestive heart failure/pulmonary edema. Electronically Signed   By: Jules Schick M.D.   On: 08/22/2023 09:51    Pending Labs Unresulted Labs (From admission, onward)     Start     Ordered   08/23/23 0500  HIV Antibody (routine testing w rflx)  (HIV Antibody (Routine testing w reflex) panel)  Tomorrow morning,   R        08/22/23 1047   08/23/23 0500  Basic metabolic panel  Daily,   R     Comments: As Scheduled for 5 days    08/22/23 1047   08/23/23 0500  Heparin level (unfractionated)  Daily,   R      08/22/23 1141   08/23/23 0500  Cooxemetry Panel (carboxy, met, total hgb, O2 sat)  Daily,   R      08/22/23 1157   08/22/23 2000  Heparin level (unfractionated)  Once-Timed,   TIMED        08/22/23 1141   08/22/23 1216  Rapid urine drug screen (hospital performed)  ONCE - STAT,   STAT        08/22/23 1215   08/22/23 1157  Cooxemetry Panel (carboxy, met, total hgb, O2 sat)  ONCE - STAT,   STAT       Comments: Once PICC placed    08/22/23 1157            Vitals/Pain Today's Vitals   08/22/23 1015 08/22/23 1137 08/22/23 1138 08/22/23  1139  BP: (!) 169/130 (!) 141/112    Pulse: (!) 40 (!) 45    Resp: (!) 21 (!) 21    Temp:      TempSrc:      SpO2: 94% 96%    Weight:   113.4 kg   PainSc:    0-No pain    Isolation Precautions No active isolations  Medications Medications  sodium chloride flush (NS) 0.9 % injection 3 mL (3 mLs  Intravenous Given 08/22/23 1107)  sodium chloride flush (NS) 0.9 % injection 3 mL (has no administration in time range)  0.9 %  sodium chloride infusion (has no administration in time range)  acetaminophen (TYLENOL) tablet 650 mg (has no administration in time range)  furosemide (LASIX) injection 80 mg (has no administration in time range)  sacubitril-valsartan (ENTRESTO) 24-26 mg per tablet (1 tablet Oral Given 08/22/23 1133)  empagliflozin (JARDIANCE) tablet 10 mg (10 mg Oral Given 08/22/23 1133)  spironolactone (ALDACTONE) tablet 12.5 mg (12.5 mg Oral Given 08/22/23 1133)  digoxin (LANOXIN) tablet 0.125 mg (0.125 mg Oral Given 08/22/23 1133)  heparin ADULT infusion 100 units/mL (25000 units/235mL) (1,400 Units/hr Intravenous New Bag/Given 08/22/23 1200)  furosemide (LASIX) injection 60 mg (60 mg Intravenous Given 08/22/23 0751)  metolazone (ZAROXOLYN) tablet 5 mg (5 mg Oral Given 08/22/23 1133)  heparin bolus via infusion 4,000 Units (4,000 Units Intravenous Bolus from Bag 08/22/23 1200)    Mobility walks     Focused Assessments Cardiac Assessment Handoff:    No results found for: "CKTOTAL", "CKMB", "CKMBINDEX", "TROPONINI" No results found for: "DDIMER" Does the Patient currently have chest pain? No    R Recommendations: See Admitting Provider Note  Report given to:   Additional Notes:

## 2023-08-22 NOTE — Progress Notes (Signed)
RT arrived bedside to assess patient on cpap via EMS. Patient noted to be diaphoretic and increased WOB. RT took patient off EMS cpap and placed patient on bipap 12/6. Patient tolerating well at this time, RN at bedside. RT will continue to monitor.

## 2023-08-22 NOTE — ED Notes (Signed)
Patient is currently very diaphoretic and with labor breathing. O2 stats 99% . MD made aware.

## 2023-08-22 NOTE — Progress Notes (Signed)
   08/22/23 1942  Assess: MEWS Score  Temp 97.7 F (36.5 C)  BP 134/75  MAP (mmHg) 92  Pulse Rate 76  ECG Heart Rate (!) 101  Resp (!) 21  SpO2 94 %  Assess: MEWS Score  MEWS Temp 0  MEWS Systolic 0  MEWS Pulse 1  MEWS RR 1  MEWS LOC 0  MEWS Score 2  MEWS Score Color Yellow  Assess: if the MEWS score is Yellow or Red  Were vital signs accurate and taken at a resting state? Yes  Does the patient meet 2 or more of the SIRS criteria? No  MEWS guidelines implemented  Yes, yellow  Treat  MEWS Interventions Considered administering scheduled or prn medications/treatments as ordered  Take Vital Signs  Increase Vital Sign Frequency  Yellow: Q2hr x1, continue Q4hrs until patient remains green for 12hrs  Escalate  MEWS: Escalate Yellow: Discuss with charge nurse and consider notifying provider and/or RRT  Notify: Charge Nurse/RN  Name of Charge Nurse/RN Notified Tanya, RN  Assess: SIRS CRITERIA  SIRS Temperature  0  SIRS Pulse 1  SIRS Respirations  1  SIRS WBC 0  SIRS Score Sum  2

## 2023-08-22 NOTE — Progress Notes (Signed)
Peripherally Inserted Central Catheter Placement  The IV Nurse has discussed with the patient and/or persons authorized to consent for the patient, the purpose of this procedure and the potential benefits and risks involved with this procedure.  The benefits include less needle sticks, lab draws from the catheter, and the patient may be discharged home with the catheter. Risks include, but not limited to, infection, bleeding, blood clot (thrombus formation), and puncture of an artery; nerve damage and irregular heartbeat and possibility to perform a PICC exchange if needed/ordered by physician.  Alternatives to this procedure were also discussed.  Bard Power PICC patient education guide, fact sheet on infection prevention and patient information card has been provided to patient /or left at bedside.   Consent obtained with interpreter.  PICC Placement Documentation  PICC Double Lumen 08/22/23 Right Brachial 40 cm 0 cm (Active)  Indication for Insertion or Continuance of Line Vasoactive infusions 08/22/23 1700  Exposed Catheter (cm) 0 cm 08/22/23 1700  Site Assessment Clean, Dry, Intact 08/22/23 1700  Lumen #1 Status Flushed;Saline locked;Blood return noted 08/22/23 1700  Lumen #2 Status Flushed;Saline locked;Blood return noted 08/22/23 1700  Dressing Type Transparent;Securing device 08/22/23 1700  Dressing Status Antimicrobial disc in place;Clean, Dry, Intact 08/22/23 1700  Line Care Connections checked and tightened 08/22/23 1700  Line Adjustment (NICU/IV Team Only) No 08/22/23 1700  Dressing Intervention New dressing 08/22/23 1700  Dressing Change Due 08/29/23 08/22/23 1700       Timmothy Sours 08/22/2023, 5:58 PM

## 2023-08-22 NOTE — ED Provider Notes (Signed)
[place] EMERGENCY DEPARTMENT AT [facility] Provider Note   CSN: [id number] Arrival date & time: 08/22/23  7253     History  Chief Complaint  Patient presents with   Shortness of Breath    Justin [name] is a 42 y.o. male.  HPI 42 year old male history of history of polysubstance abuse, nonischemic cardiomyopathy, hypertriglyceridemia, acute systolic CHF, hypertension, tachycardia, type 2 diabetes, obesity, alcohol abuse presents today complaining of dyspnea.  History is obtained through interpreter secondary to language barrier.  Patient states that he has had increased dyspnea over the past 2 to 3 days.  It came on somewhat abruptly.  He has not been taking his medications regularly for at least a month.  He does endorse he has had some productive cough and felt warm.  No definite chills or fever.  Per EMS report patient was at 83% room air on their arrival.  He had a respiratory rate of 33.  He was given 1 sublingual nitro by EMS.  He was placed on oxygen and eventually on CPAP.  Patient is not complaining of chest pain.     Home Medications Prior to Admission medications   Medication Sig Start Date End Date Taking? Authorizing Provider  empagliflozin (JARDIANCE) 10 MG TABS tablet Take 1 tablet (10 mg total) by mouth daily. 03/14/23   Arrien, York Ram, MD  metoprolol succinate (TOPROL-XL) 100 MG 24 hr tablet Take 1 tablet (100 mg total) by mouth daily. Take with or immediately following a meal. 03/14/23 06/11/23  Arrien, York Ram, MD  rivaroxaban (XARELTO) 20 MG TABS tablet Take 1 tablet (20 mg total) by mouth daily with supper. 03/13/23   Arrien, York Ram, MD  rosuvastatin (CRESTOR) 10 MG tablet Take 1 tablet (10 mg total) by mouth daily. 03/13/23 06/11/23  Arrien, York Ram, MD  sacubitril-valsartan (ENTRESTO) 24-26 MG Take 1 tablet by mouth 2 (two) times daily. 03/13/23   Arrien, York Ram, MD  spironolactone (ALDACTONE) 25 MG  tablet Take 1 tablet (25 mg total) by mouth daily. 03/14/23 06/11/23  Arrien, York Ram, MD  torsemide (DEMADEX) 20 MG tablet Take 2 tablets (40 mg total) by mouth daily. 03/13/23 06/11/23  Arrien, York Ram, MD      Allergies    Patient has no known allergies.    Review of Systems   Review of Systems  Physical Exam Updated Vital Signs BP (!) 119/97   Pulse 94   Temp 98.4 F (36.9 C) (Axillary)   Resp (!) 24   SpO2 100%  Physical Exam Vitals reviewed.  Constitutional:      General: He is in acute distress.     Appearance: He is obese. He is not ill-appearing.  HENT:     Head: Normocephalic.     Mouth/Throat:     Mouth: Mucous membranes are moist.  Cardiovascular:     Rate and Rhythm: Tachycardia present.  Pulmonary:     Effort: Tachypnea present.     Breath sounds: Examination of the left-upper field reveals wheezing. Wheezing present.  Musculoskeletal:        General: Normal range of motion.     Cervical back: Normal range of motion.  Skin:    General: Skin is warm.  Neurological:     General: No focal deficit present.     Mental Status: He is alert.  Psychiatric:        Mood and Affect: Mood normal.     ED Results / Procedures / Treatments  Labs (all labs ordered are listed, but only abnormal results are displayed) Labs Reviewed  BASIC METABOLIC PANEL - Abnormal; Notable for the following components:      Result Value   Glucose, Bld 245 (*)    Creatinine, Ser 1.53 (*)    Calcium 8.0 (*)    GFR, Estimated 57 (*)    All other components within normal limits  BRAIN NATRIURETIC PEPTIDE - Abnormal; Notable for the following components:   B Natriuretic Peptide 951.5 (*)    All other components within normal limits  I-STAT VENOUS BLOOD GAS, ED - Abnormal; Notable for the following components:   Bicarbonate 30.2 (*)    Acid-Base Excess 3.0 (*)    All other components within normal limits  I-STAT CHEM 8, ED - Abnormal; Notable for the following  components:   BUN 22 (*)    Creatinine, Ser 1.50 (*)    Glucose, Bld 267 (*)    Calcium, Ion 1.13 (*)    All other components within normal limits  I-STAT ARTERIAL BLOOD GAS, ED - Abnormal; Notable for the following components:   pCO2 arterial 53.8 (*)    Bicarbonate 30.8 (*)    Acid-Base Excess 4.0 (*)    All other components within normal limits  TROPONIN I (HIGH SENSITIVITY) - Abnormal; Notable for the following components:   Troponin I (High Sensitivity) 111 (*)    All other components within normal limits  RESP PANEL BY RT-PCR (RSV, FLU A&B, COVID)  RVPGX2  CBC WITH DIFFERENTIAL/PLATELET  COMPREHENSIVE METABOLIC PANEL  I-STAT CG4 LACTIC ACID, ED  TROPONIN I (HIGH SENSITIVITY)    EKG EKG Interpretation Date/Time:  Wednesday August 22 2023 08:06:08 EST Ventricular Rate:  102 PR Interval:  169 QRS Duration:  110 QT Interval:  393 QTC Calculation: 512 R Axis:   143  Text Interpretation: Sinus tachycardia Supraventricular bigeminy Probable left atrial enlargement Right axis deviation Abnormal R-wave progression, late transition Borderline ST depression, inferior leads Borderline ST elevation, anterolateral leads Prolonged QT interval Confirmed by Margarita Grizzle [phone]) on 08/22/2023 8:33:02 AM  Radiology DG Chest Port 1 View  Result Date: 08/22/2023 CLINICAL DATA:  Dyspnea. EXAM: PORTABLE CHEST 1 VIEW COMPARISON:  03/10/2023. FINDINGS: There are increased interstitial markings with bilateral hilar and bibasilar predominance. Findings favor moderate congestive heart failure/pulmonary edema. There are probable bilateral small layering pleural effusions. There are probable atelectatic changes at the lung bases. No pneumothorax. Stable mildly enlarged cardio-mediastinal silhouette. No acute osseous abnormalities. The soft tissues are within normal limits. IMPRESSION: *Findings favor congestive heart failure/pulmonary edema. Electronically Signed   By: Jules Schick M.D.   On:  08/22/2023 09:51    Procedures .Critical Care  Performed by: Margarita Grizzle, MD Authorized by: Margarita Grizzle, MD   Critical care provider statement:    Critical care time (minutes):  75   Critical care start time:  08/22/2023 7:03 AM   Critical care end time:  08/22/2023 10:03 AM   Critical care was necessary to treat or prevent imminent or life-threatening deterioration of the following conditions:  Respiratory failure and CNS failure or compromise   Critical care was time spent personally by me on the following activities:  Development of treatment plan with patient or surrogate, discussions with consultants, evaluation of patient's response to treatment, examination of patient, ordering and review of laboratory studies, ordering and review of radiographic studies, ordering and performing treatments and interventions, pulse oximetry, re-evaluation of patient's condition and review of old charts  Medications Ordered in ED Medications  nitroGLYCERIN 50 mg in dextrose 5 % 250 mL (0.2 mg/mL) infusion (25 mcg/min Intravenous Rate/Dose Change 08/22/23 0927)  furosemide (LASIX) injection 60 mg (60 mg Intravenous Given 08/22/23 0751)    ED Course/ Medical Decision Making/ A&P Clinical Course as of 08/22/23 1006  Wed Aug 22, 2023  0733 DG Chest 2 View [DR]  [phone] B Natriuretic Peptide(!): 951.5 [JE]  0833 CBC reviewed interpreted and within normal limits [DR]  0834 BNP is elevated at 951 [DR]  0837 Cxr reviewed and increased marking c.w. chf [DR]    Clinical Course User Index [DR] Margarita Grizzle, MD [JE] Arlyss Gandy, Student-PA                                 Medical Decision Making Amount and/or Complexity of Data Reviewed Labs: ordered. Decision-making details documented in ED Course. Radiology: ordered. Decision-making details documented in ED Course.  Risk Prescription drug management.   42 year old male presents today complaining of increased dyspnea over the past  several days.  Patient has known history of CHF and is hypertensive and noncompliant with medications Differential diagnosis includes but is not limited to CHF, acute coronary syndrome and MI, PE, pneumonia, viral infection Patient evaluated here with labs and imaging EKG with diffuse ST changes however not significantly changed from prior.  Patient currently is not complaining of any chest pain.  He does have an initial elevated troponin at 111 but continues to have no complaints of chest pain. Cardiology has been consulted Chest x-Carlester Kasparek with enlarged heart and diffuse increased markings consistent with CHF BNP elevated at 951. Patient is being treated with Lasix and nitroglycerin to lower blood pressure Consult called to rate cardiology they will see in consult Discussed with Rudene Christians, MD on for internal medicine ts Patient off bipap, on nasal cannula- sats 94%  1- respiratory failure- secondary to hypertension and volume overload 2 abnormal EKG appears to be stable from prior 3 elevated troponin-patient is not having any active chest pain but is dyspneic and suspect this is demand ischemia.  Cardiology has been consulted  Patient was evaluated with serial blood gases as patient had become sleepier.  I suspect that he is just tired after having several days with increased work of breathing and has now had some relief.  His pH and pCO2 have remained stable Respiratory panel was obtained and negative        Final Clinical Impression(s) / ED Diagnoses Final diagnoses:  Acute on chronic respiratory failure with hypoxia [facility])    Rx / DC Orders ED Discharge Orders     None         Margarita Grizzle, MD 08/22/23 1006

## 2023-08-22 NOTE — Progress Notes (Signed)
PHARMACY - ANTICOAGULATION CONSULT NOTE  Pharmacy Consult for heparin Indication: atrial fibrillation  No Known Allergies  Patient Measurements: Height: 5\' 8"  (172.7 cm) Weight: 121.3 kg (267 lb 6.7 oz) IBW/kg (Calculated) : 68.4 Heparin Dosing Weight: 100kg  Vital Signs: Temp: 97.7 F (36.5 C) (12/11 1942) Temp Source: Oral (12/11 1942) BP: 134/75 (12/11 1942) Pulse Rate: 98 (12/11 2100)  Labs: Recent Labs    08/22/23 0722 08/22/23 0735 08/22/23 0910 08/22/23 0938 08/22/23 1432 08/22/23 2206  HGB 16.5 16.7  16.7 16.0  --   --   --   HCT 49.8 49.0  49.0 47.0  --   --   --   PLT 313  --   --   --   --   --   HEPARINUNFRC  --   --   --   --   --  <0.10*  CREATININE 1.53* 1.50*  --  1.47*  --   --   TROPONINIHS 111*  --   --  126* 96*  --     Estimated Creatinine Clearance (by C-G formula based on SCr of 1.47 mg/dL (H)) Male: 86.5 mL/min (A) Male: 82.1 mL/min (A)   Medical History: Past Medical History:  Diagnosis Date   Heart failure with reduced ejection fraction (HCC) 2017   [facility]   Hypertension 2017   [facility]     Assessment: 77 yoM with hx AF on Xarelto but noncompliant admitted with CHF. Pharmacy to dose IV heparin. Pt reportedly has not taken medications in a month. CBC wnl on admit.  Heparin level undetectable on infusion at 1400 units/hr. No issues with line or bleeding reported per RN.  Goal of Therapy:  Heparin level 0.3-0.7 units/ml Monitor platelets by anticoagulation protocol: Yes   Plan:  Rebolus heparin 3000 units and increase heparin infusion to 1800 units/hr F/u heparin level in 6 hours  Christoper Fabian, PharmD, BCPS Please see amion for complete clinical pharmacist phone list 08/22/2023

## 2023-08-22 NOTE — Progress Notes (Signed)
Heart Failure Navigator Progress Note  Assessed for Heart & Vascular TOC clinic readiness.  Patient does not meet criteria due to Advanced Heart Failure Team patient of Dr. Bensimhon.   Navigator will sign off at this time.   Lucilia Yanni, BSN, RN Heart Failure Nurse Navigator Secure Chat Only   

## 2023-08-22 NOTE — Progress Notes (Signed)
PHARMACY - ANTICOAGULATION CONSULT NOTE  Pharmacy Consult for heparin Indication: atrial fibrillation  No Known Allergies  Patient Measurements:   Heparin Dosing Weight: 100kg  Vital Signs: Temp: 98.4 F (36.9 C) (12/11 0919) Temp Source: Axillary (12/11 0919) BP: 119/97 (12/11 0945) Pulse Rate: 94 (12/11 0946)  Labs: Recent Labs    08/22/23 0722 08/22/23 0735 08/22/23 0910 08/22/23 0938  HGB 16.5 16.7  16.7 16.0  --   HCT 49.8 49.0  49.0 47.0  --   PLT 313  --   --   --   CREATININE 1.53* 1.50*  --  1.47*  TROPONINIHS 111*  --   --   --     CrCl cannot be calculated (Unknown ideal weight.).   Medical History: Past Medical History:  Diagnosis Date   Heart failure with reduced ejection fraction [facility]) 2017   [facility]   Hypertension 2017   [facility]     Assessment: 89 yoM with hx AF on Xarelto but noncompliant admitted with CHF. Pharmacy to dose IV heparin. Pt reportedly has not taken medications in a month. CBC wnl on admit.   Goal of Therapy:  Heparin level 0.3-0.7 units/ml Monitor platelets by anticoagulation protocol: Yes   Plan:  Heparin 4000 units x1 then 1400 units/h Check heparin level in 6h    Fredonia Highland, PharmD, [place], [facility] Clinical Pharmacist [phone] Please check AMION for all [facility] Pharmacy numbers 08/22/2023

## 2023-08-22 NOTE — Consult Note (Addendum)
Advanced Heart Failure Team Consult Note   Primary Physician: Grayce Sessions, NP PCP-Cardiologist:  Jodelle Red, MD  Reason for Consultation: Acute on Chronic Biventricular Systolic Heart Failure  HPI:    Justin [name] is seen today for evaluation of acute on chronic biV systolic heart failure at the request of Dr. Lafonda Mosses.   He is a 42 y.o. Spanish-speaking male with history of chronic systolic CHF, hx uncontrolled HTN, DM II, mitral regurgitation, ETOH abuse, hx "crack"/cocaine use.   Heart failure dates back to 2017. Admitted to [facility] with acute systolic CHF and hypertensive urgency. Echo showed severe LV dsyfunction and severe LVH. Cath with no CAD.   Admitted in 06/23 with acute respiratory failure with hypoxia requiring BiPAP 2/2 a/c CHF. He was diuresed and initiated on GDMT. After discharge he no-showed TOC and cardiology clinic follow-ups.   Admitted 1/24 and 7/24 for CHF exacerbation and volume overload requiring IV diuresis due to noncompliance with medications. After last admission was restarted on Entresto, Lasix, Marice Potter and Metoprolol. He no showed to post hospital follow ups with Advanced Heart Failure Clinic and Cardiology.  Echo 6/24: EF 20-25% gHK, GIIDD, RV mod reduced, mild MVR, IVC <50% variability.  Presented to ED on CPAP by EMS for SOB and CP. On arrival he was transitioned to BiPap for increased work of breathing. He has had worsening SOB and cough over the last 2-3 days. He has not been taking his medications. Vitals on arrival HR 109, BP 180/126 and satO2 98 w RR 22 on BiPap 40. Labs on arrival notable for K 4.1, sCr 1.5, Trop 111, BNP 951, LA 1.2. CXR with cardiomegaly. EKG showed sinus tachycardia with PACs and LBBB at 102 bpm. RVP negative. He was give dose of Lasix IV 60 in the ED with adequate response and place on a Nitroglycerin gtt.   Home Medications Prior to Admission medications   Medication  Sig Start Date End Date Taking? Authorizing Provider  empagliflozin (JARDIANCE) 10 MG TABS tablet Take 1 tablet (10 mg total) by mouth daily. 03/14/23   Arrien, York Ram, MD  metoprolol succinate (TOPROL-XL) 100 MG 24 hr tablet Take 1 tablet (100 mg total) by mouth daily. Take with or immediately following a meal. 03/14/23 06/11/23  Arrien, York Ram, MD  rivaroxaban (XARELTO) 20 MG TABS tablet Take 1 tablet (20 mg total) by mouth daily with supper. 03/13/23   Arrien, York Ram, MD  rosuvastatin (CRESTOR) 10 MG tablet Take 1 tablet (10 mg total) by mouth daily. 03/13/23 06/11/23  Arrien, York Ram, MD  sacubitril-valsartan (ENTRESTO) 24-26 MG Take 1 tablet by mouth 2 (two) times daily. 03/13/23   Arrien, York Ram, MD  spironolactone (ALDACTONE) 25 MG tablet Take 1 tablet (25 mg total) by mouth daily. 03/14/23 06/11/23  Arrien, York Ram, MD  torsemide (DEMADEX) 20 MG tablet Take 2 tablets (40 mg total) by mouth daily. 03/13/23 06/11/23  Arrien, York Ram, MD   Past Medical History: Past Medical History:  Diagnosis Date   Heart failure with reduced ejection fraction (HCC) 2017   [facility]   Hypertension 2017   [facility]   Past Surgical History: Past Surgical History:  Procedure Laterality Date   RIGHT/LEFT HEART CATH AND CORONARY ANGIOGRAPHY N/A 02/16/2022   Procedure: RIGHT/LEFT HEART CATH AND CORONARY ANGIOGRAPHY;  Surgeon: Tonny Bollman, MD;  Location: [facility] INVASIVE CV LAB;  Service: Cardiovascular;  Laterality: N/A;   Family History: Family History  Problem Relation Age of Onset   Hypertension Father    Social History: Social History   Socioeconomic History   Marital status: Single    Spouse name: Not on file   Number of children: 3   Years of education: Not on file   Highest education level: 7th grade  Occupational History   Occupation: Education administrator    Comment: varies  Tobacco Use   Smoking status: Never   Smokeless tobacco: Never   Vaping Use   Vaping status: Never Used  Substance and Sexual Activity   Alcohol use: Yes    Alcohol/week: 30.0 standard drinks of alcohol    Types: 30 Cans of beer per week    Comment: occ   Drug use: Not Currently    Types: "Crack" cocaine, Cocaine    Comment: 2 weeks   Sexual activity: Not on file  Other Topics Concern   Not on file  Social History Narrative   Not on file   Social Determinants of Health   Financial Resource Strain: Medium Risk (06/05/2022)   Overall Financial Resource Strain (CARDIA)    Difficulty of Paying Living Expenses: Somewhat hard  Food Insecurity: No Food Insecurity (03/10/2023)   Hunger Vital Sign    Worried About Running Out of Food in the Last Year: Never true    Ran Out of Food in the Last Year: Never true  Transportation Needs: Unmet Transportation Needs (03/10/2023)   PRAPARE - Administrator, Civil Service (Medical): Yes    Lack of Transportation (Non-Medical): Yes  Physical Activity: Not on file  Stress: Not on file  Social Connections: Not on file   Allergies:  No Known Allergies  Objective:    Vital Signs:   Temp:  [98.4 F (36.9 C)] 98.4 F (36.9 C) (12/11 0919) Pulse Rate:  [40-113] 45 (12/11 1137) Resp:  [18-28] 21 (12/11 1137) BP: (117-208)/(80-130) 141/112 (12/11 1137) SpO2:  [83 %-100 %] 96 % (12/11 1137) FiO2 (%):  [40 %] 40 % (12/11 0715) Weight:  [113.4 kg] 113.4 kg (12/11 1138)    Weight change: Filed Weights   08/22/23 1138  Weight: 113.4 kg    Intake/Output:  No intake or output data in the 24 hours ending 08/22/23 1214   Physical Exam    General: Obese appearing. No distress on [place]. Moderately diaphoretic. HEENT: neck supple.   Cardiac: JVP to jaw. S1 and S2 present. No murmurs or rub. Resp: Lung sounds clear and equal B/L. Bases diminished Abdomen: Taut, non-tender, distended. + BS. Extremities: No rash, cyanosis. Severe BLE edema. Cool extremities. Neuro: Affect pleasant. Moves all  extremities without difficulty.  Telemetry   SR 90s (personally reviewed)  EKG    ST 102 bpm, LBBB, PACs (personally reviewed)  Labs   Basic Metabolic Panel: Recent Labs  Lab 08/22/23 0722 08/22/23 0735 08/22/23 0910 08/22/23 0938  NA 137 136  137 135 133*  K 3.9 4.1  4.2 4.0 4.6  CL 106 99  --  96*  CO2 26  --   --  29  GLUCOSE 245* 267*  --  291*  BUN 18 22*  --  19  CREATININE 1.53* 1.50*  --  1.47*  CALCIUM 8.0*  --   --  9.2    Liver Function Tests: Recent Labs  Lab 08/22/23 0938  AST 22  ALT 19  ALKPHOS 99  BILITOT 1.3*  PROT 6.9  ALBUMIN 2.9*   No results for input(s): "LIPASE", "AMYLASE" in the  last 168 hours. No results for input(s): "AMMONIA" in the last 168 hours.  CBC: Recent Labs  Lab 08/22/23 0722 08/22/23 0735 08/22/23 0910  WBC 9.8  --   --   NEUTROABS 7.6  --   --   HGB 16.5 16.7  16.7 16.0  HCT 49.8 49.0  49.0 47.0  MCV 90.5  --   --   PLT 313  --   --     Cardiac Enzymes: No results for input(s): "CKTOTAL", "CKMB", "CKMBINDEX", "TROPONINI" in the last 168 hours.  BNP: BNP (last 3 results) Recent Labs    11/16/22 0933 03/10/23 0918 08/22/23 0722  BNP 765.5* 697.7* 951.5*    ProBNP (last 3 results) No results for input(s): "PROBNP" in the last 8760 hours.   CBG: No results for input(s): "GLUCAP" in the last 168 hours.  Coagulation Studies: No results for input(s): "LABPROT", "INR" in the last 72 hours.  Imaging   [place] EKG SITE RITE  Result Date: 08/22/2023 If Site Rite image not attached, placement could not be confirmed due to current cardiac rhythm.  DG Chest Port 1 View  Result Date: 08/22/2023 CLINICAL DATA:  Dyspnea. EXAM: PORTABLE CHEST 1 VIEW COMPARISON:  03/10/2023. FINDINGS: There are increased interstitial markings with bilateral hilar and bibasilar predominance. Findings favor moderate congestive heart failure/pulmonary edema. There are probable bilateral small layering pleural effusions. There are  probable atelectatic changes at the lung bases. No pneumothorax. Stable mildly enlarged cardio-mediastinal silhouette. No acute osseous abnormalities. The soft tissues are within normal limits. IMPRESSION: *Findings favor congestive heart failure/pulmonary edema. Electronically Signed   By: Jules Schick M.D.   On: 08/22/2023 09:51    Medications:    Current Medications:  digoxin  0.125 mg Oral Daily   empagliflozin  10 mg Oral Daily   furosemide  80 mg Intravenous Once   sacubitril-valsartan  1 tablet Oral BID   sodium chloride flush  3 mL Intravenous Q12H   spironolactone  12.5 mg Oral Daily    Infusions:  sodium chloride     heparin 1,400 Units/hr (08/22/23 1200)   Patient Profile   Justin [name] is a 42 y.o. Spanish-speaking male with history of chronic systolic CHF, hx uncontrolled HTN, DM II, mitral regurgitation, ETOH abuse, hx "crack"/cocaine use.  Admitted for CHF exacerbation  Assessment/Plan   Chronic combined systolic and diastolic CHF: - Suspect CM most likely d/t HCM from uncontrolled HTN in setting of noncompliance, ETOH abuse, & cocaine abuse.  - Echo (6/23): EF 30-35%, RV okay, mild to moderate MR. - R/LHC (6/23): No significant CAD, RA mean 1, PA mean 19, PCWP 3, LVEDP 7, Fick CO/CI 4.17/1.99 - cMRI (6/23): LVEF 29%, LV severely dilated, asymmetric LVH with basal septum measuring 17 mm, RVEF 45%, patchy LGE in setpum and RV insertion sites + subepicardial LGE in inferolateral wall. Scar pattern felt to be consistent with HCM. However, d/t subepicardial LGE in inferolateral wall and presence of mediastinal lymphadenopathy, cardiac PET recommended to r/o sarcoid, small pericardial effusion.  - Echo (9/23): EF 15-20%, GIIIDD, RV mildly dilated and mod reduced function, Mod MR, trivial TR - Echo (6/24): EF 20-25% gHK, GIIDD, RV mod reduced, mild MVR, IVC <50% variability. - Severely volume overloaded on exam. Lasix 80 IV bid + Metolazone 5 today. PICC  line for CVP, Co-ox monitoring. LA 1.3. Trop 111, repeat pending - Resume Entresto 24-26 bid - Resume Spiro 12.5 mg daily - Resume Jardiance 10 mg daily - Hold bb with  acute exacerbation - Repeat Echo   2. Atrial fibrillation:  - Diagnosed 1/24. Previously on Xarelto, although historically noncompliant with a/c - ECG: ST 102 bpm, LBBB, PACs - On heparin gtt - Needs sleep study, but uninsured  3. HTN: Uncontrolled.  - 180/123 on admit - Start GDMT as above.  4. Polysubstance abuse: Previous ETOH and crack/cocaine use.  - UDS pending - Cessation imperative.  5. DM II: Hgb A1c 7.8 . He is not on insulin. - Continue SGLT2i. No GU symptoms.  6. CKD II: baseline SCr 1.3-1.5. Cardiorenal. Volume overloaded. - sCr 1.50 on exam  - Diuresis as above  - Follow BMET   7. Hyperlipidemia: LDL 137 (6/24) - Resume statin + SGLT2i.  Medication concerns reviewed with patient and pharmacy team. Barriers identified: Uninsured, language barrier (spanish-speaking only), noncompliant with medications  Length of Stay: 0  [place] Lee, NP  08/22/2023, 12:14 PM  Advanced Heart Failure Team Pager [phone] (M-F; 7a - 5p)  Please contact [facility] for night-coverage after hours (4p -7a ) and weekends on [email or web address]  Patient seen and examined with the above-signed Advanced Practice Provider and/or Housestaff. I personally reviewed laboratory data, imaging studies and relevant notes. I independently examined the patient and formulated the important aspects of the plan. I have edited the note to reflect any of my changes or salient points. I have personally discussed the plan with the patient and/or family.  42 y/o male with h/o systolic HF due to severe NICM,severe HTN, obesity, polysubstance abuse and PAF.   Presents to ER with decompensated HF in setting of medication noncompliance. Denies recent ETOH or drug use. Says he has not been taking any medicines.   Very SOB. Denies CP.  Lactate normal  1.2  General:  tachypneic.  HEENT: normal Neck: supple.JVP to jaw. Carotids 2+ bilat; no bruits. No lymphadenopathy or thryomegaly appreciated. Cor: . Regular rate & rhythm. 2/6 MR Lungs: + crackles Abdomen: obese  soft, nontender, nondistended. No hepatosplenomegaly. No bruits or masses. Good bowel sounds. Extremities: no cyanosis, clubbing, rash, 3-4+ edema into thighs Neuro: alert & orientedx3, cranial nerves grossly intact. moves all 4 extremities w/o difficulty. Affect pleasant  He is markedly volume overload with respiratory distress in setting of medication noncompliance and severe HTN.   Plan; - Admit 2C - Bpap support as needed - Increase IV diuretics. Add metolazone - Place PICC for CVP and co-ox - Wrap legs.  - Repeat echo - Aggressive GDMT to bring pressure down - Check UDS.   Arvilla Meres, MD  3:28 PM

## 2023-08-22 NOTE — ED Notes (Signed)
MD assessed patient at bedside

## 2023-08-22 NOTE — H&P (Signed)
Date: 08/22/2023               Patient Name:  Justin [name] MRN: [id number]  DOB: [birth year] Age / Sex: 42 y.o., male   PCP: Grayce Sessions, NP         Medical Service: Internal Medicine Teaching Service         Attending Physician: Dr. Mercie Eon, MD      First Contact: Dr. Monna Fam, MD Pager [phone]    Second Contact: Dr. Rocky Morel, DO Pager [phone]         After Hours (After 5p/  First Contact Pager: [phone]  weekends / holidays): Second Contact Pager: [phone]   SUBJECTIVE   Chief Complaint: dyspnea  History of Present Illness:  Justin [name] is a 42 year old spanish speaking person with PMH of HFrEF EF 20-25% 6/24, Atrial fibrillation, CKD stage 3a, diabetes who presents with 3-4 days of progressive dyspnea. He endorses orthopnea, PND and lower extremity edema. He did not refill medications following running out after admission in June as he was concerned about cost.  He follows with Dr. Shirlee Latch and Cristal Deer for cardiology. He was diagnosed with heart failure in 2017. Last echo was 6/24 with EF 20-25%. He had prior workup with R/LHC with no significant CAD and cMRI showed patchy enhancement with differentials of HOCM versus sarcoidosis. This is his 3rd admission for heart failure in the last year.  At baseline, he is able to walk up to 20 minutes without shortness of breath.   Interpreter used through duration of interview.  ED Course: Hypoxic to 83% on RA on arrival and placed on Bipap. Hypertensive to systolic of 220s per EDP  ABG 2.9/ 53.8/ 104/ 30.8 RSV, Flu, Covid negative LA 1.2 Trop 111, EKG with ST elevations in V2-3 BNP 951  Started on nitroglycerin infusion Cardiology consulted  Past Medical History HFrEF Paroxysmal Atrial fibrillation HTN CKD stage 3a Type 2 diabetes HLD  Past Surgical History:  Procedure Laterality Date   RIGHT/LEFT HEART CATH AND CORONARY ANGIOGRAPHY N/A 02/16/2022   Procedure: RIGHT/LEFT HEART  CATH AND CORONARY ANGIOGRAPHY;  Surgeon: Tonny Bollman, MD;  Location: [facility] INVASIVE CV LAB;  Service: Cardiovascular;  Laterality: N/A;    Meds:  Metoprolol succinate 100 mg Empagliflozin10 mg Entresto 50 mg Spironolactone 25 mg Torsemide 40 mg  Social:  Lives With: friends Occupation: Education administrator Support: sister, Esmeralda Level of Function: independent in iADLs PCP: None, ok with [facility] follow-up and given card Substances: denies tobacco use, drug use and alcohol use.  Review of use of cocaine noted.  Patient denies substance use.  Family History: N/A  Allergies: Allergies as of 08/22/2023   (No Known Allergies)    Review of Systems: A complete ROS was negative except as per HPI.   OBJECTIVE:   Physical Exam: Blood pressure (!) 119/97, pulse 100, temperature 98.4 F (36.9 C), temperature source Axillary, resp. rate (!) 21, SpO2 98%.  Constitutional: chronically ill-appearing, in no acute distress, appears fatigued Neck: difficulty with assessing JVD due to body habitus Cardiovascular: regular rate and rhythm, no m/r/g Pulmonary/Chest: normal work of breathing on room air, diffuse crackles appreciated throughout lung fields Abdominal: soft, non-tender, non-distended MSK: 1+ pitting edema to mid shin, feet are warm and well perfused Neurological: alert & oriented x 3 Skin: warm and dry  Labs: CBC Hgb 16.5 WBC 9.8  CMP Creatinine 1.4, around baseline Tbili 1.3  Imaging: FINDINGS: There are increased interstitial markings with bilateral hilar and  bibasilar predominance. Findings favor moderate congestive heart failure/pulmonary edema. There are probable bilateral small layering pleural effusions. There are probable atelectatic changes at the lung bases. No pneumothorax.   Stable mildly enlarged cardio-mediastinal silhouette.   No acute osseous abnormalities.   The soft tissues are within normal limits.   IMPRESSION: *Findings favor congestive heart  failure/pulmonary edema.  EKG: sinus tachycardia with RAD, ventricular bigeminy, some ST elevated in V2 and V3  ASSESSMENT & PLAN:   Assessment & Plan by Problem: Principal Problem:   Acute exacerbation of CHF (congestive heart failure) (HCC) Active Problems:   Acute on chronic heart failure (HCC)   Justin [name] is a 42 y.o. person living with a history of HFrEF EF 20-25% 6/24, Atrial fibrillation, CKD stage 3a, diabetes who presented with dyspnea and admitted for acute on chronic heart failure exacerbation on hospital day 0  Acute on chronic heart failure exacerbation Advanced heart failure, EF 20 to 25% Hypertension Patient presenting with several days of progressive dyspnea.  He was hypoxic on arrival and oxygenation improved with BiPAP.  BNP elevated into 900s with chest x-ray showing diffuse interstitial edema.  On exam he has diffuse crackles throughout lung fields with 1+ pitting edema to lower extremities bilaterally.  He is stable oxygenation with 1 L nasal cannula.  Appreciate heart failure team assistance in this patient with advanced heart failure.  Previously had workup with cardiac MRI and high concern for hypertensive versus substance abuse related heart failure.   -Appreciate heart failure team recommendations -Admit to progressive floor -He received Lasix 60 this a.m., home torsemide 40 mg daily, start Lasix 80 IV twice daily with metolazone 5 mg today -PICC line for CVP and Co. ox monitoring -GDMT with Entresto 24-26 twice daily, spironolactone 12.5 mg, and Jardiance 10 mg resumed.- -Holding beta-blocker in setting of acute exacerbation -Repeat echo -Daily weights -strict I's and O's -telemetry monitoring -UDS  Elevated troponin ST elevation in V2, V3 Troponin elevated to 111-126 on repeat.  EKG with some ST elevation in leads V2 and V3.  Repeat heart failure team evaluation.  He is grossly volume overloaded and I think this is most likely demand  ischemia.  Will continue to trend troponin.  Patient denies chest pain at any time in the last several days.  -Trend troponin -Repeat EKG if chest pain or troponins continue to trend up  Chronic stable conditions: Type 2 diabetes Last A1c well-controlled in January 2024.  Home medication with SGLT2 inhibitor.  -Carb modified -SSI  CKD stage IIIa Creatinine appears around baseline at 1.4.  Medications include Entresto and SGLT2 inhibitor.  History of paroxysmal atrial fibrillation Medications include DOAC.  He has not taken this in some time.  -Start IV heparin for A-fib  Diet: carb modified VTE: Heparin IVF: None,None Code: Full  Prior to Admission Living Arrangement: Home, living friends Anticipated Discharge Location: Home Barriers to Discharge: home  Dispo: Admit patient to Inpatient with expected length of stay greater than 2 midnights.  Signed: Rudene Christians, DO Internal Medicine Resident PGY-3  08/22/2023, 2:20 PM

## 2023-08-22 NOTE — Progress Notes (Signed)
RT obtained ABG per MD order, ABG  @0910 : 7.36/53.8/104/30.8.  RT took patient off bipap and placed patient on 3L [place] per MD @0946 . Patient tolerating well at this time. RN aware. RT will continue to monitor as needed.

## 2023-08-23 ENCOUNTER — Inpatient Hospital Stay (HOSPITAL_COMMUNITY): Payer: MEDICAID

## 2023-08-23 DIAGNOSIS — I5043 Acute on chronic combined systolic (congestive) and diastolic (congestive) heart failure: Secondary | ICD-10-CM

## 2023-08-23 LAB — COOXEMETRY PANEL
Carboxyhemoglobin: 2.4 % — ABNORMAL HIGH (ref 0.5–1.5)
Methemoglobin: <0.7 % (ref 0.0–1.5)
O2 Saturation: 66.3 %
Total hemoglobin: 17.2 g/dL — ABNORMAL HIGH (ref 12.0–16.0)

## 2023-08-23 LAB — BASIC METABOLIC PANEL
Anion gap: 16 — ABNORMAL HIGH (ref 5–15)
BUN: 25 mg/dL — ABNORMAL HIGH (ref 6–20)
CO2: 29 mmol/L (ref 22–32)
Calcium: 9.8 mg/dL (ref 8.9–10.3)
Chloride: 93 mmol/L — ABNORMAL LOW (ref 98–111)
Creatinine, Ser: 1.71 mg/dL — ABNORMAL HIGH (ref 0.61–1.24)
GFR, Estimated: 50 mL/min — ABNORMAL LOW (ref >60)
Glucose, Bld: 199 mg/dL — ABNORMAL HIGH (ref 70–99)
Potassium: 3.5 mmol/L (ref 3.5–5.1)
Sodium: 138 mmol/L (ref 135–145)

## 2023-08-23 LAB — ECHOCARDIOGRAM COMPLETE
AR max vel: 3.16 cm2
AV Area VTI: 2.88 cm2
AV Area mean vel: 2.89 cm2
AV Mean grad: 2.7 mm[Hg]
AV Peak grad: 4.5 mm[Hg]
Ao pk vel: 1.06 m/s
Area-P 1/2: 4.73 cm2
Calc EF: 23.9 %
Height: 68 in
S' Lateral: 5.3 cm
Single Plane A2C EF: 14.9 %
Single Plane A4C EF: 31.5 %
Weight: 4155.23 [oz_av]

## 2023-08-23 LAB — CBC
HCT: 51.1 % (ref 39.0–52.0)
Hemoglobin: 16.8 g/dL (ref 13.0–17.0)
MCH: 30 pg (ref 26.0–34.0)
MCHC: 32.9 g/dL (ref 30.0–36.0)
MCV: 91.3 fL (ref 80.0–100.0)
Platelets: 271 10*3/uL (ref 150–400)
RBC: 5.6 MIL/uL (ref 4.22–5.81)
RDW: 13.2 % (ref 11.5–15.5)
WBC: 8.1 10*3/uL (ref 4.0–10.5)
nRBC: 0 % (ref 0.0–0.2)

## 2023-08-23 LAB — GLUCOSE, CAPILLARY
Glucose-Capillary: 163 mg/dL — ABNORMAL HIGH (ref 70–99)
Glucose-Capillary: 231 mg/dL — ABNORMAL HIGH (ref 70–99)
Glucose-Capillary: 192 mg/dL — ABNORMAL HIGH (ref 70–99)
Glucose-Capillary: 186 mg/dL — ABNORMAL HIGH (ref 70–99)

## 2023-08-23 LAB — HEPARIN LEVEL (UNFRACTIONATED): Heparin Unfractionated: 0.28 [IU]/mL — ABNORMAL LOW (ref 0.30–0.70)

## 2023-08-23 LAB — HIV ANTIBODY (ROUTINE TESTING W REFLEX): HIV Screen 4th Generation wRfx: NONREACTIVE

## 2023-08-23 MED ORDER — LOSARTAN POTASSIUM 50 MG PO TABS
50.0000 mg | ORAL_TABLET | Freq: Every day | ORAL | Status: DC
  Administered 2023-08-23 – 2023-08-25 (×3): 50 mg via ORAL
  Filled 2023-08-23 (×3): qty 1

## 2023-08-23 MED ORDER — RIVAROXABAN 15 MG PO TABS: 15.0000 mg | ORAL_TABLET | Freq: Every day | ORAL | Status: DC

## 2023-08-23 MED ORDER — PERFLUTREN LIPID MICROSPHERE
1.0000 mL | INTRAVENOUS | Status: AC | PRN
  Administered 2023-08-23: 4 mL via INTRAVENOUS

## 2023-08-23 MED ORDER — POTASSIUM CHLORIDE CRYS ER 20 MEQ PO TBCR
40.0000 meq | EXTENDED_RELEASE_TABLET | Freq: Once | ORAL | Status: AC
  Administered 2023-08-23: 40 meq via ORAL
  Filled 2023-08-23: qty 2

## 2023-08-23 MED ORDER — RIVAROXABAN 20 MG PO TABS
20.0000 mg | ORAL_TABLET | Freq: Every day | ORAL | Status: DC
Start: 2023-08-23 — End: 2023-08-25
  Administered 2023-08-23 – 2023-08-25 (×3): 20 mg via ORAL
  Filled 2023-08-23 (×3): qty 1

## 2023-08-23 NOTE — Progress Notes (Signed)
PHARMACY - ANTICOAGULATION Pharmacy Consult for heparin Indication: atrial fibrillation Brief A/P: Heparin level subtherapeutic Increase Heparin rate  No Known Allergies  Patient Measurements: Height: 5\' 8"  (172.7 cm) Weight: 117.8 kg (259 lb 11.2 oz) IBW/kg (Calculated) : 68.4 Heparin Dosing Weight: 100kg  Vital Signs: Temp: 98.1 F (36.7 C) (12/12 0505) Temp Source: Oral (12/12 0505) BP: 141/96 (12/12 0505) Pulse Rate: 80 (12/12 0505)  Labs: Recent Labs    08/22/23 0722 08/22/23 0735 08/22/23 0910 08/22/23 0938 08/22/23 1432 08/22/23 2206 08/23/23 0604 08/23/23 0605  HGB 16.5 16.7  16.7 16.0  --   --   --  16.8  --   HCT 49.8 49.0  49.0 47.0  --   --   --  51.1  --   PLT 313  --   --   --   --   --  271  --   HEPARINUNFRC  --   --   --   --   --  <0.10*  --  0.28*  CREATININE 1.53* 1.50*  --  1.47*  --   --   --   --   TROPONINIHS 111*  --   --  126* 96*  --   --   --     Estimated Creatinine Clearance (by C-G formula based on SCr of 1.47 mg/dL (H)) Male: 16.1 mL/min (A) Male: 80.8 mL/min (A)   Assessment: 42 y.o. adult with Afib for heparin  Goal of Therapy:  Heparin level 0.3-0.7 units/ml Monitor platelets by anticoagulation protocol: Yes   Plan:  Increase Heparin 1950 units/hr  Geannie Risen, PharmD, BCPS  08/23/2023

## 2023-08-23 NOTE — Progress Notes (Signed)
*  PRELIMINARY RESULTS* Echocardiogram 2D Echocardiogram has been performed.  [name] 08/23/2023, 10:05 AM

## 2023-08-23 NOTE — Progress Notes (Signed)
Summary: [name] is a 42 year old spanish speaking person with PMH of HFrEF EF 20-25% 6/24, Atrial fibrillation, CKD stage 3a, diabetes who presented with 3-4 days of progressive dyspnea and was admitted to the Internal Medicine Teaching Service on 12/11 for acute on chronic HF exacerbation.   Subjective:  Day #1. Patient endorses feeling well in terms of breathing and lower extremity swelling. He denies any new acute complaints. Clarified that his last use of cocaine was about a month ago, last use of alcohol was about a week ago, and that he has not been able to take any medications since he ran out about a month ago. Last time he got medications was from his discharge from the hospital. He has been unable to afford medications due to being unable to work. Has not followed with a doctor since his hospitalization in June for acute respiratory failure.   Objective:  Vital signs in last 24 hours: Vitals:   08/23/23 0615 08/23/23 0734 08/23/23 1105 08/23/23 1117  BP:  (!) 149/129 (!) 163/102   Pulse:  (!) 112  90  Resp:  15 (!) 22   Temp:  98.1 F (36.7 C) 98.6 F (37 C)   TempSrc:  Oral Oral   SpO2:  93% 93%   Weight: 117.8 kg     Height:          Latest Ref Rng & Units 08/23/2023    6:04 AM 08/22/2023    9:10 AM 08/22/2023    7:35 AM  CBC  WBC 4.0 - 10.5 K/uL 8.1     Hemoglobin 13.0 - 17.0 g/dL 62.1  30.8  65.7    84.6   Hematocrit 39.0 - 52.0 % 51.1  47.0  49.0    49.0   Platelets 150 - 400 K/uL 271          Latest Ref Rng & Units 08/23/2023    8:11 AM 08/22/2023    9:38 AM 08/22/2023    9:10 AM  BMP  Glucose 70 - 99 mg/dL 962  952    BUN 6 - 20 mg/dL 25  19    Creatinine 8.41 - 1.24 mg/dL 3.24  4.01    Sodium 027 - 145 mmol/L 138  133  135   Potassium 3.5 - 5.1 mmol/L 3.5  4.6  4.0   Chloride 98 - 111 mmol/L 93  96    CO2 22 - 32 mmol/L 29  29    Calcium 8.9 - 10.3 mg/dL 9.8  9.2      Troponin I 96 (from 126)  CVO2 from PICC was 50.4 yesterday,  66.3 today  Physical Exam Constitutional: Patient is resting comfortably in bed in no acute distress.  CV: Regular rate and rhythm without murmurs on auscultation. No JVD appreciated. Pitting edema bilateral lower extremities. Warm extremities.  Pulmonary/Respiratory: Normal respiratory effort on supplemental O2 via [place]. Diffuse crackles in bilateral lungs on auscultation, R > L.  Abdominal: Soft, non-tender, non-distended.  Skin: Warm and dry.  Psych: Normal mood and affect.   Echo IMPRESSIONS   1. Left ventricular ejection fraction, by estimation, is 20 to 25%. The  left ventricle has severely decreased function. The left ventricle  demonstrates global hypokinesis. The left ventricular internal cavity size  was moderately dilated. There is  moderate concentric left ventricular hypertrophy. Indeterminate diastolic  filling due to E-A fusion.   2. Right ventricular systolic function is mildly reduced. The right  ventricular size is  mildly enlarged. Tricuspid regurgitation signal is  inadequate for assessing PA pressure.   3. Left atrial size was moderately dilated.   4. Right atrial size was mildly dilated.   5. A small pericardial effusion is present. The pericardial effusion is  circumferential.   6. The mitral valve is normal in structure. Mild mitral valve  regurgitation. No evidence of mitral stenosis.   7. The aortic valve is normal in structure. Aortic valve regurgitation is  not visualized. No aortic stenosis is present.   8. The inferior vena cava is normal in size with greater than 50%  respiratory variability, suggesting right atrial pressure of 3 mmHg.   9. Cannot exclude a small PFO.   Comparison(s): No significant change from prior study. 02/2023-EF 20-25%.   Assessment/Plan:  Principal Problem:   Acute exacerbation of CHF (congestive heart failure) (HCC) Active Problems:   Acute on chronic heart failure (HCC)  Justin [name] is a 42 y.o. person  living with a history of HFrEF EF 20-25% 6/24, Atrial fibrillation, CKD stage 3a, and T2DM who presented with dyspnea and was admitted to the Internal Medicine Teaching Service for acute on chronic heart failure exacerbation.  Advanced heart failure, EF 20 to 25% Acute on chronic HF exacerbation  Hypertension Patient with 3 similar admissions this year. Echo from this admission without significant change from prior study, with LVEF 20-25%.HF team following, appreciate their recommendations. Recent exacerbation likely due to poorly controlled hypertension and medication non-adherence due to financial constraints. Patient also with history of exacerbating factors like alcohol and cocaine, UDS on admission negative. On room air at home, required up to 5L O2, down to 4L. Net diuresis following IV diuretics and metolazone yesterday -2.3 L, CVO2 from PICC improved to 66.3 today from 50.4 yesterday. Patient with bilateral crackles, improved BLE edema (per patient), and no JVD. Extremities remain warm. Patient was previously prescribed Entresto 24-26 daily, will get Losartan 40 mg daily on discharge instead due to cost. Resumed GDMT with spironolactone 12.5 mg and Jardiance 10 mg. Did not resume BB (metoprolol 100 mg daily) due to low output HF, added digoxin 0.125 mg daily for improved contractility. Given 1x dose of IV lasix 80 mg today.  Plan:   -Appreciate heart failure team recommendations -Continue spironolactone 12.5 mg, losartan 40 mg daily, and Jardiance 10 mg  -Continue digoxin 0.125 mg daily  -Holding beta-blocker  -Assess volume status tomorrow, diurese with IV vs PO diuretic as appropriate -Wean down O2 as tolerated -Daily weights -strict I's and O's -telemetry monitoring   Elevated troponin, peaked ST elevation in V2, V3 Troponin initially elevated to 111-126 on repeat. Patient denies chest pain at any time in the last several days. Last troponin decreased to 96. On telemetry. Consider  repeating EKG if patient has new chest pain.    Type 2 diabetes Last A1c 7.8% January 2024. On Jardiance 10 mg daily. Patient on moderate SSI.    CKD stage IIIa Baseline serum creatinine 1.6-1.7, creatinine today Cr 1.71. Will plan to continue diuresing, will not hold any medications for now. Monitor on daily BMP.     History of paroxysmal atrial fibrillation Previously on Xarelto but has not taken in some time. On IV heparin for A-fib, will be transitioned to DOAC.  - START Xarelto 15 mg daily  HLD Chronic, LDL 137 as of June 2024. Resumed Rosuvastatin 10 mg daily.   SDOH Patient will need meds sent with him at discharge and close PCP (NP Randa Evens,  family medicine)  and cardiology follow-up at discharge. Team will consult Sharen Hones pharmD prior to discharge to help assist with medications.   Diet: Carb modified VTE: DOAC IVF: None, None Code: Full   Prior to Admission Living Arrangement: Home Anticipated Discharge Location: Pending medical management Barriers to Discharge: Pending medical management Dispo: Anticipated discharge in approximately 1-2 day (s).   Philomena Doheny, MD, PGY-1 08/23/2023, 11:47 AM Pager: [phone] After 5pm on weekdays and 1pm on weekends: On Call pager [phone]

## 2023-08-23 NOTE — Progress Notes (Addendum)
Advanced Heart Failure Rounding Note  PCP-Cardiologist: Jodelle Red, MD   Subjective:    Yesterday diuresed with IV lasix and metolazone. Negative 2.3 liters.   CO-OX 66%  Denies SOB. Feels better. No chest pain.   Objective:   Weight Range: 117.8 kg Body mass index is 39.49 kg/m.   Vital Signs:   Temp:  [97.7 F (36.5 C)-98.4 F (36.9 C)] 98.1 F (36.7 C) (12/12 0505) Pulse Rate:  [40-113] 80 (12/12 0505) Resp:  [17-28] 17 (12/12 0505) BP: (110-208)/(71-130) 141/96 (12/12 0505) SpO2:  [93 %-100 %] 93 % (12/12 0505) FiO2 (%):  [40 %] 40 % (12/11 0715) Weight:  [113.4 kg-121.3 kg] 117.8 kg (12/12 0615)    Weight change: Filed Weights   08/22/23 1138 08/22/23 1400 08/23/23 0615  Weight: 113.4 kg 121.3 kg 117.8 kg    Intake/Output:   Intake/Output Summary (Last 24 hours) at 08/23/2023 0711 Last data filed at 08/23/2023 1610 Gross per 24 hour  Intake 705.35 ml  Output 3100 ml  Net -2394.65 ml    CVP 7-8   Physical Exam    General:  No resp difficulty HEENT: Normal Neck: Supple. JVP 7-8  . Carotids 2+ bilat; no bruits. No lymphadenopathy or thyromegaly appreciated. Cor: PMI nondisplaced. Regular rate & rhythm. No rubs, gallops. 2/6 MR . Lungs: Clear on 2 liters Abdomen: Soft, nontender, nondistended. No hepatosplenomegaly. No bruits or masses. Good bowel sounds. Extremities: No cyanosis, clubbing, rash, edema. LUE PICC Neuro: Alert & orientedx3, cranial nerves grossly intact. moves all 4 extremities w/o difficulty. Affect pleasant   Telemetry   SR 70-80s   EKG    N/A  Labs    CBC Recent Labs    08/22/23 0722 08/22/23 0735 08/22/23 0910 08/23/23 0604  WBC 9.8  --   --  8.1  NEUTROABS 7.6  --   --   --   HGB 16.5   < > 16.0 16.8  HCT 49.8   < > 47.0 51.1  MCV 90.5  --   --  91.3  PLT 313  --   --  271   < > = values in this interval not displayed.   Basic Metabolic Panel Recent Labs    96/04/54 0722 08/22/23 0735  08/22/23 0910 08/22/23 0938  NA 137 136  137 135 133*  K 3.9 4.1  4.2 4.0 4.6  CL 106 99  --  96*  CO2 26  --   --  29  GLUCOSE 245* 267*  --  291*  BUN 18 22*  --  19  CREATININE 1.53* 1.50*  --  1.47*  CALCIUM 8.0*  --   --  9.2  MG  --   --   --  2.0   Liver Function Tests Recent Labs    08/22/23 0938  AST 22  ALT 19  ALKPHOS 99  BILITOT 1.3*  PROT 6.9  ALBUMIN 2.9*   No results for input(s): "LIPASE", "AMYLASE" in the last 72 hours. Cardiac Enzymes No results for input(s): "CKTOTAL", "CKMB", "CKMBINDEX", "TROPONINI" in the last 72 hours.  BNP: BNP (last 3 results) Recent Labs    11/16/22 0933 03/10/23 0918 08/22/23 0722  BNP 765.5* 697.7* 951.5*    ProBNP (last 3 results) No results for input(s): "PROBNP" in the last 8760 hours.   D-Dimer No results for input(s): "DDIMER" in the last 72 hours. Hemoglobin A1C No results for input(s): "HGBA1C" in the last 72 hours. Fasting Lipid Panel No results  for input(s): "CHOL", "HDL", "LDLCALC", "TRIG", "CHOLHDL", "LDLDIRECT" in the last 72 hours. Thyroid Function Tests No results for input(s): "TSH", "T4TOTAL", "T3FREE", "THYROIDAB" in the last 72 hours.  Invalid input(s): "FREET3"  Other results:   Imaging    [place] EKG SITE RITE Result Date: 08/22/2023 If Site Rite image not attached, placement could not be confirmed due to current cardiac rhythm.  DG Chest Port 1 View Result Date: 08/22/2023 CLINICAL DATA:  Dyspnea. EXAM: PORTABLE CHEST 1 VIEW COMPARISON:  03/10/2023. FINDINGS: There are increased interstitial markings with bilateral hilar and bibasilar predominance. Findings favor moderate congestive heart failure/pulmonary edema. There are probable bilateral small layering pleural effusions. There are probable atelectatic changes at the lung bases. No pneumothorax. Stable mildly enlarged cardio-mediastinal silhouette. No acute osseous abnormalities. The soft tissues are within normal limits. IMPRESSION:  *Findings favor congestive heart failure/pulmonary edema. Electronically Signed   By: Jules Schick M.D.   On: 08/22/2023 09:51     Medications:     Scheduled Medications:  Chlorhexidine Gluconate Cloth  6 each Topical Daily   digoxin  0.125 mg Oral Daily   empagliflozin  10 mg Oral Daily   furosemide  80 mg Intravenous BID   insulin aspart  0-15 Units Subcutaneous TID WC   rosuvastatin  10 mg Oral Daily   sacubitril-valsartan  1 tablet Oral BID   sodium chloride flush  10-40 mL Intracatheter Q12H   sodium chloride flush  3 mL Intravenous Q12H   spironolactone  12.5 mg Oral Daily    Infusions:  sodium chloride     heparin 1,800 Units/hr (08/23/23 0045)    PRN Medications: sodium chloride, acetaminophen, bismuth subsalicylate, mouth rinse, sodium chloride flush, sodium chloride flush    Patient Profile   Justin [name] is a 42 y.o. Spanish-speaking male with history of chronic systolic CHF, hx uncontrolled HTN, DM II, mitral regurgitation, ETOH abuse, hx "crack"/cocaine use.   Admitted for CHF exacerbation  Assessment/Plan   Acute on Chronic combined systolic and diastolic CHF: - Suspect CM most likely d/t HCM from uncontrolled HTN in setting of noncompliance, ETOH abuse, & cocaine abuse.  - Echo (6/23): EF 30-35%, RV okay, mild to moderate MR. - R/LHC (6/23): No significant CAD, RA mean 1, PA mean 19, PCWP 3, LVEDP 7, Fick CO/CI 4.17/1.99 - cMRI (6/23): LVEF 29%, LV severely dilated, asymmetric LVH with basal septum measuring 17 mm, RVEF 45%, patchy LGE in setpum and RV insertion sites + subepicardial LGE in inferolateral wall. Scar pattern felt to be consistent with HCM. However, d/t subepicardial LGE in inferolateral wall and presence of mediastinal lymphadenopathy, cardiac PET recommended to r/o sarcoid, small pericardial effusion.  - Echo (9/23): EF 15-20%, GIIIDD, RV mildly dilated and mod reduced function, Mod MR, trivial TR - Echo (6/24): EF 20-25%  gHK, GIIDD, RV mod reduced, mild MVR, IVC <50% variability. -Repeat ECHO pending.  - Admitted with severe volume overload. Lactic acid 1.2   - Volume status improving. CVP 7-8. Give one more dose of IV lasix. - Continue Entresto 24-26 bid - Continue Spiro 12.5 mg daily - Continue Jardiance 10 mg daily - Hold bb with acute exacerbation - BMET pending. If stable will increase entresto .    2. Atrial fibrillation:  - Diagnosed 1/24. Previously on Xarelto, although historically noncompliant with a/c - ECG: ST 102 bpm, LBBB, PACs - In SR today. Continue heparin gtt. Switch to DOAC if no invasive procedure needed.  - Needs sleep study, but uninsured  3. HTN: Uncontrolled.  - 180/123 on admit. Better controlled.  - Start GDMT as above   4. Polysubstance abuse: Previous ETOH and crack/cocaine use.  - UDS negative.  - Cessation imperative.   5. DM II: Hgb A1c 7.8 . He is not on insulin. - Continue SGLT2i. No GU symptoms.   6. CKD II: baseline SCr 1.3-1.5. Cardiorenal.   - BMET pending.    7. Hyperlipidemia: LDL 137 (6/24) - Resume statin + SGLT2i.   Consult Cardiac Rehab Asked to RN to check BMET   Length of Stay: 1  Amy Clegg, NP  08/23/2023, 7:11 AM  Advanced Heart Failure Team Pager [phone] (M-F; 7a - 5p)  Please contact [facility] for night-coverage after hours (5p -7a ) and weekends on [email or web address]  Patient seen and examined with the above-signed Advanced Practice Provider and/or Housestaff. I personally reviewed laboratory data, imaging studies and relevant notes. I independently examined the patient and formulated the important aspects of the plan. I have edited the note to reflect any of my changes or salient points. I have personally discussed the plan with the patient and/or family.  Diuresing well on IV lasix. Weight down 8 pounds. Co-ox 66% CVP 8. BP improved. Scr 1.5 -> 1.7  Starting to feel better. Still SOB with exertion   Echo EF 20-25% RV mildly HK    General: Sitting up  No resp difficulty HEENT: normal Neck: supple. JVP 8 Carotids 2+ bilat; no bruits. No lymphadenopathy or thryomegaly appreciated. Cor: Regular rate & rhythm. No rubs, gallops or murmurs. Lungs: clear Abdomen: obese soft, nontender, nondistended. No hepatosplenomegaly. No bruits or masses. Good bowel sounds. Extremities: no cyanosis, clubbing, rash, 2+ edema Neuro: alert & orientedx3, cranial nerves grossly intact. moves all 4 extremities w/o difficulty. Affect pleasant  Co-ox ok remains volume overloaded. Continue IV diuresis. Titrate GDMT. Watch Scr.   Arvilla Meres, MD  5:00 PM

## 2023-08-23 NOTE — Progress Notes (Signed)
Mobility Specialist Progress Note:    08/23/23 1400  Oxygen Therapy  SpO2 94 %  O2 Device Nasal Cannula  O2 Flow Rate (L/min) 4 L/min  Mobility  Activity Ambulated with assistance in room  Level of Assistance Contact guard assist, steadying assist  Assistive Device Other (Comment) (IV Pole)  Distance Ambulated (ft) 20 ft  Activity Response Tolerated well  Mobility Referral Yes  Mobility visit 1 Mobility  Mobility Specialist Start Time (ACUTE ONLY) 1442  Mobility Specialist Stop Time (ACUTE ONLY) 1446  Mobility Specialist Time Calculation (min) (ACUTE ONLY) 4 min   Pt requesting assistance exiting BR. Only requiring CG throughout. Pt left on EOB with call bell and all needs met.  D'Vante Earlene Plater Mobility Specialist Please contact via Special educational needs teacher or Rehab office at [phone]

## 2023-08-23 NOTE — TOC Initial Note (Addendum)
Transition of Care [facility]) - Initial/Assessment Note    Patient Details  Name: Justin [name] MRN: [id number] Date of Birth: 06/14/1981  [facility] CM/SW Contact:    Elliot Cousin, RN Phone Number: [phone] 08/23/2023, 1:10 PM  Clinical Narrative:                 Spoke to pt using in house Spanish Interpreter.  CM spoke to pt and states he lives in a house with several people.  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 States he does not have transportation to appts or money to use bus. CM can provide pt with bus passes.  Explained to pt that HF CSW attempted call to assist with transportation to his appt. Verified his contact number.  CM has provided pt with a scale in the past. Pt states he can get a scale for daily weights. Discussed low sodium/heart healthy diet.  Will use MATCH/HF funds for medications.  Contacted PCP's office and they will call pt to schedule appt. States he was no show for last appt. Discussed with pt the no show policy for [facility]. Referral sent to [name] Financial counselor to screen for Medicaid.     Expected Discharge Plan: Home/Self Care Barriers to Discharge: Continued Medical Work up   Patient Goals and CMS Choice Patient states their goals for this hospitalization and ongoing recovery are:: would like to get better          Expected Discharge Plan and Services   Discharge Planning Services: CM Consult   Living arrangements for the past 2 months: Boarding House                                      Prior Living Arrangements/Services Living arrangements for the past 2 months: [employer] Lives with:: Roommate   Do you feel safe going back to the place where you live?: Yes      Need for Family Participation in Patient Care: No (Comment) Care giver support system in place?: No (comment)   Criminal Activity/Legal Involvement Pertinent to Current Situation/Hospitalization:  No - Comment as needed  Activities of Daily Living   ADL Screening (condition at time of admission) Independently performs ADLs?: Yes (appropriate for developmental age) Is the patient deaf or have difficulty hearing?: No Does the patient have difficulty seeing, even when wearing glasses/contacts?: No Does the patient have difficulty concentrating, remembering, or making decisions?: No  Permission Sought/Granted Permission sought to share information with : Case Manager, Family Supports Permission granted to share information with : Yes, Verbal Permission Granted  Share Information with NAME: Braxton Feathers     Permission granted to share info w Relationship: sister  Permission granted to share info w Contact Information: [phone]  Emotional Assessment Appearance:: Appears older than stated age Attitude/Demeanor/Rapport: Engaged Affect (typically observed): Accepting Orientation: : Oriented to Self, Oriented to Place, Oriented to  Time, Oriented to Situation   Psych Involvement: No (comment)  Admission diagnosis:  Acute exacerbation of CHF (congestive heart failure) (HCC) [I50.9] Acute on chronic heart failure (HCC) [I50.9] Acute on chronic respiratory failure with hypoxia (HCC) [J96.21] Patient Active Problem List   Diagnosis Date Noted   Acute on chronic heart failure (HCC) 08/22/2023   CHF (congestive heart failure) (HCC) 03/10/2023   Uncontrolled hypertension 11/17/2022   Type 2 diabetes mellitus without complication, without  long-term current use of insulin (HCC) 11/17/2022   Acute exacerbation of CHF (congestive heart failure) (HCC) 11/16/2022   Paroxysmal atrial fibrillation with RVR (HCC) 09/22/2022   Acute on chronic congestive heart failure (HCC) 09/21/2022   Chronic kidney disease, stage II (mild) 09/21/2022   Alcohol abuse 09/21/2022   Class 3 obesity 06/04/2022   Acute kidney injury superimposed on chronic kidney disease (HCC) 06/03/2022   Acute on chronic  systolic CHF (congestive heart failure) (HCC) 06/02/2022   Type 2 diabetes mellitus with hyperlipidemia (HCC) 06/02/2022   Polysubstance abuse (HCC) 06/02/2022   Ventricular tachycardia (HCC)    Primary hypertension    Cardiomyopathy due to hypertension, with heart failure (HCC)    Acute systolic CHF (congestive heart failure) (HCC)    Nonrheumatic mitral valve regurgitation    Dyspnea    Community acquired pneumonia    Acute hypoxemic respiratory failure (HCC) 02/09/2022   Hypertriglyceridemia 04/18/2018   Nonischemic cardiomyopathy (HCC) 03/29/2018   Chronic diastolic heart failure (HCC) 03/29/2018   Essential hypertension 03/29/2018   PCP:  Grayce Sessions, NP Pharmacy:   [facility] [address] [place] [address] Phone: [phone] Fax: [phone]     Social Drivers of Health (SDOH) Social History: SDOH Screenings   Food Insecurity: No Food Insecurity (08/22/2023)  Housing: Low Risk  (08/22/2023)  Transportation Needs: No Transportation Needs (08/22/2023)  Utilities: Not At Risk (08/22/2023)  Alcohol Screen: Medium Risk (02/20/2022)  Depression (PHQ2-9): Low Risk  (10/12/2022)  Financial Resource Strain: Medium Risk (06/05/2022)  Tobacco Use: Low Risk  (08/22/2023)   SDOH Interventions:     Readmission Risk Interventions     No data to display

## 2023-08-23 NOTE — Inpatient Diabetes Management (Signed)
Inpatient Diabetes Program Recommendations  AACE/ADA: New Consensus Statement on Inpatient Glycemic Control (2015)  Target Ranges:  Prepandial:   less than 140 mg/dL      Peak postprandial:   less than 180 mg/dL (1-2 hours)      Critically ill patients:  140 - 180 mg/dL    Latest Reference Range & Units 08/22/23 16:20 08/22/23 21:41 08/23/23 06:13 08/23/23 11:03  Glucose-Capillary 70 - 99 mg/dL 865 (H)  8 units Novolog  225 (H) 163 (H)  2 units Novolog  231 (H)  (H): Data is abnormally high    Home DM Meds: Jardiance 10 mg daily  Current Orders: Jardiance 10 mg daily      Novolog Moderate Correction Scale/ SSI (0-15 units) TID AC     MD- Please consider starting Novolog Meal Coverage: Novolog 3 units TID with meals HOLD if pt NPO HOLD if pt eats <50% meals     --Will follow patient during hospitalization--  Ambrose Finland RN, MSN, CDCES Diabetes Coordinator Inpatient Glycemic Control Team Team Pager: [phone] (8a-5p)

## 2023-08-23 NOTE — Progress Notes (Signed)
   Discussed with pharmacy.   Unable to obtain entresto at discharged because he has not submitted financial information.  Switch to losartan in anticipation of discharge.   [place] Cloe NP-C  12:02 PM

## 2023-08-24 ENCOUNTER — Other Ambulatory Visit (HOSPITAL_COMMUNITY): Payer: Self-pay

## 2023-08-24 ENCOUNTER — Telehealth (INDEPENDENT_AMBULATORY_CARE_PROVIDER_SITE_OTHER): Payer: Self-pay

## 2023-08-24 ENCOUNTER — Inpatient Hospital Stay (HOSPITAL_COMMUNITY): Payer: MEDICAID

## 2023-08-24 ENCOUNTER — Telehealth (INDEPENDENT_AMBULATORY_CARE_PROVIDER_SITE_OTHER): Payer: Self-pay | Admitting: Primary Care

## 2023-08-24 LAB — COOXEMETRY PANEL
Carboxyhemoglobin: 2.2 % — ABNORMAL HIGH (ref 0.5–1.5)
Methemoglobin: <0.7 % (ref 0.0–1.5)
O2 Saturation: 68.1 %
Total hemoglobin: 16.7 g/dL — ABNORMAL HIGH (ref 12.0–16.0)

## 2023-08-24 LAB — GLUCOSE, CAPILLARY
Glucose-Capillary: 202 mg/dL — ABNORMAL HIGH (ref 70–99)
Glucose-Capillary: 251 mg/dL — ABNORMAL HIGH (ref 70–99)
Glucose-Capillary: 206 mg/dL — ABNORMAL HIGH (ref 70–99)
Glucose-Capillary: 156 mg/dL — ABNORMAL HIGH (ref 70–99)

## 2023-08-24 LAB — BASIC METABOLIC PANEL
Anion gap: 13 (ref 5–15)
BUN: 33 mg/dL — ABNORMAL HIGH (ref 6–20)
CO2: 33 mmol/L — ABNORMAL HIGH (ref 22–32)
Calcium: 9.2 mg/dL (ref 8.9–10.3)
Chloride: 90 mmol/L — ABNORMAL LOW (ref 98–111)
Creatinine, Ser: 1.61 mg/dL — ABNORMAL HIGH (ref 0.61–1.24)
GFR, Estimated: 54 mL/min — ABNORMAL LOW (ref >60)
Glucose, Bld: 254 mg/dL — ABNORMAL HIGH (ref 70–99)
Potassium: 3.2 mmol/L — ABNORMAL LOW (ref 3.5–5.1)
Sodium: 136 mmol/L (ref 135–145)

## 2023-08-24 LAB — CBC
HCT: 49.1 % (ref 39.0–52.0)
Hemoglobin: 16.5 g/dL (ref 13.0–17.0)
MCH: 30.6 pg (ref 26.0–34.0)
MCHC: 33.6 g/dL (ref 30.0–36.0)
MCV: 90.9 fL (ref 80.0–100.0)
Platelets: 284 10*3/uL (ref 150–400)
RBC: 5.4 MIL/uL (ref 4.22–5.81)
RDW: 13.1 % (ref 11.5–15.5)
WBC: 8.6 10*3/uL (ref 4.0–10.5)
nRBC: 0 % (ref 0.0–0.2)

## 2023-08-24 MED ORDER — SPIRONOLACTONE 25 MG PO TABS
25.0000 mg | ORAL_TABLET | Freq: Every day | ORAL | Status: DC
  Administered 2023-08-25: 25 mg via ORAL
  Filled 2023-08-24: qty 1

## 2023-08-24 MED ORDER — POTASSIUM CHLORIDE CRYS ER 20 MEQ PO TBCR
40.0000 meq | EXTENDED_RELEASE_TABLET | Freq: Two times a day (BID) | ORAL | Status: AC
  Administered 2023-08-24 (×2): 40 meq via ORAL
  Filled 2023-08-24 (×2): qty 2

## 2023-08-24 MED ORDER — TORSEMIDE 20 MG PO TABS
20.0000 mg | ORAL_TABLET | Freq: Every day | ORAL | Status: DC
  Administered 2023-08-24 – 2023-08-25 (×2): 20 mg via ORAL
  Filled 2023-08-24 (×2): qty 1

## 2023-08-24 NOTE — Progress Notes (Addendum)
Advanced Heart Failure Rounding Note  PCP-Cardiologist: Jodelle Red, MD   Subjective:    CO-OX 68%  Scr 1.7>1.6. BUN and CO2 creeping up. K 3.2.  4.8L UOP yesterday with 80 mg lasix IV.  Feeling better. No dyspnea. Leg edema resolved. History obtained via [employer].  Objective:   Weight Range: 113.9 kg Body mass index is 38.18 kg/m.   Vital Signs:   Temp:  [98.1 F (36.7 C)-98.6 F (37 C)] 98.4 F (36.9 C) (12/13 0511) Pulse Rate:  [77-112] 88 (12/13 0511) Resp:  [15-22] 16 (12/13 0511) BP: (97-163)/(78-129) 109/80 (12/13 0511) SpO2:  [93 %-96 %] 96 % (12/13 0511) Weight:  [113.9 kg] 113.9 kg (12/13 0636)    Weight change: Filed Weights   08/22/23 1400 08/23/23 0615 08/24/23 0636  Weight: 121.3 kg 117.8 kg 113.9 kg    Intake/Output:   Intake/Output Summary (Last 24 hours) at 08/24/2023 0700 Last data filed at 08/24/2023 1610 Gross per 24 hour  Intake --  Output 4850 ml  Net -4850 ml    CVP 4  Physical Exam    General:  Well appearing.  HEENT: normal Neck: supple. no JVD. Carotids 2+ bilat; no bruits.  Cor: PMI nondisplaced. Regular rate & rhythm. No rubs, gallops or murmurs. Lungs: clear Abdomen: soft, nontender, nondistended.  Extremities: no cyanosis, clubbing, rash, edema, RUE PICC Neuro: alert & orientedx3. Affect pleasant    Telemetry   SR 80s  EKG    N/A  Labs    CBC Recent Labs    08/22/23 0722 08/22/23 0735 08/23/23 0604 08/24/23 0354  WBC 9.8  --  8.1 8.6  NEUTROABS 7.6  --   --   --   HGB 16.5   < > 16.8 16.5  HCT 49.8   < > 51.1 49.1  MCV 90.5  --  91.3 90.9  PLT 313  --  271 284   < > = values in this interval not displayed.   Basic Metabolic Panel Recent Labs    96/04/54 0938 08/23/23 0811 08/24/23 0354  NA 133* 138 136  K 4.6 3.5 3.2*  CL 96* 93* 90*  CO2 29 29 33*  GLUCOSE 291* 199* 254*  BUN 19 25* 33*  CREATININE 1.47* 1.71* 1.61*  CALCIUM 9.2 9.8 9.2  MG 2.0  --   --     Liver Function Tests Recent Labs    08/22/23 0938  AST 22  ALT 19  ALKPHOS 99  BILITOT 1.3*  PROT 6.9  ALBUMIN 2.9*   No results for input(s): "LIPASE", "AMYLASE" in the last 72 hours. Cardiac Enzymes No results for input(s): "CKTOTAL", "CKMB", "CKMBINDEX", "TROPONINI" in the last 72 hours.  BNP: BNP (last 3 results) Recent Labs    11/16/22 0933 03/10/23 0918 08/22/23 0722  BNP 765.5* 697.7* 951.5*    ProBNP (last 3 results) No results for input(s): "PROBNP" in the last 8760 hours.   D-Dimer No results for input(s): "DDIMER" in the last 72 hours. Hemoglobin A1C No results for input(s): "HGBA1C" in the last 72 hours. Fasting Lipid Panel No results for input(s): "CHOL", "HDL", "LDLCALC", "TRIG", "CHOLHDL", "LDLDIRECT" in the last 72 hours. Thyroid Function Tests No results for input(s): "TSH", "T4TOTAL", "T3FREE", "THYROIDAB" in the last 72 hours.  Invalid input(s): "FREET3"  Other results:   Imaging    ECHOCARDIOGRAM COMPLETE Result Date: 08/23/2023    ECHOCARDIOGRAM REPORT   Patient Name:   LINDELL PEDDY Date of Exam: 08/23/2023 Medical  Rec #:  [id number]                      Height:       68.0 in Accession #:    [id number]                     Weight:       259.7 lb Date of Birth:  Jan 05, 1981                      BSA:          2.284 m Patient Age:    42 years                       BP:           149/129 mmHg Patient Gender: M                              HR:           86 bpm. Exam Location:  Inpatient Procedure: 2D Echo, 3D Echo, Cardiac Doppler, Color Doppler and Intracardiac            Opacification Agent Indications:    CHF I50.9  History:        Patient has prior history of Echocardiogram examinations, most                 recent 02/12/2023. CHF and Cardiomyopathy, CKD2; Polysubstance and                 ETOH abuse, Arrythmias:Atrial Fibrillation; Risk                 Factors:Non-Smoker, Dyslipidemia, Diabetes and Hypertension.  Sonographer:     Dondra Prader RVT RCS Referring Phys: [id number] JULIE MACHEN IMPRESSIONS  1. Left ventricular ejection fraction, by estimation, is 20 to 25%. The left ventricle has severely decreased function. The left ventricle demonstrates global hypokinesis. The left ventricular internal cavity size was moderately dilated. There is moderate concentric left ventricular hypertrophy. Indeterminate diastolic filling due to E-A fusion.  2. Right ventricular systolic function is mildly reduced. The right ventricular size is mildly enlarged. Tricuspid regurgitation signal is inadequate for assessing PA pressure.  3. Left atrial size was moderately dilated.  4. Right atrial size was mildly dilated.  5. A small pericardial effusion is present. The pericardial effusion is circumferential.  6. The mitral valve is normal in structure. Mild mitral valve regurgitation. No evidence of mitral stenosis.  7. The aortic valve is normal in structure. Aortic valve regurgitation is not visualized. No aortic stenosis is present.  8. The inferior vena cava is normal in size with greater than 50% respiratory variability, suggesting right atrial pressure of 3 mmHg.  9. Cannot exclude a small PFO. Comparison(s): No significant change from prior study. 02/2023-EF 20-25%. Conclusion(s)/Recommendation(s): No left ventricular mural or apical thrombus/thrombi. FINDINGS  Left Ventricle: Left ventricular ejection fraction, by estimation, is 20 to 25%. The left ventricle has severely decreased function. The left ventricle demonstrates global hypokinesis. Definity contrast agent was given IV to delineate the left ventricular endocardial borders. The left ventricular internal cavity size was moderately dilated. There is moderate concentric left ventricular hypertrophy. Indeterminate diastolic filling due to E-A fusion. Right Ventricle: The right ventricular size is mildly enlarged. Right vetricular wall thickness was not well visualized. Right ventricular systolic  function is  mildly reduced. Tricuspid regurgitation signal is inadequate for assessing PA pressure. Left Atrium: Left atrial size was moderately dilated. Right Atrium: Right atrial size was mildly dilated. Pericardium: A small pericardial effusion is present. The pericardial effusion is circumferential. Mitral Valve: The mitral valve is normal in structure. Mild mitral valve regurgitation. No evidence of mitral valve stenosis. Tricuspid Valve: The tricuspid valve is grossly normal. Tricuspid valve regurgitation is trivial. No evidence of tricuspid stenosis. Aortic Valve: The aortic valve is normal in structure. Aortic valve regurgitation is not visualized. No aortic stenosis is present. Aortic valve mean gradient measures 2.7 mmHg. Aortic valve peak gradient measures 4.5 mmHg. Aortic valve area, by VTI measures 2.88 cm. Pulmonic Valve: The pulmonic valve was not well visualized. Pulmonic valve regurgitation is not visualized. No evidence of pulmonic stenosis. Aorta: The aortic root, ascending aorta, aortic arch and descending aorta are all structurally normal, with no evidence of dilitation or obstruction. Venous: The inferior vena cava is normal in size with greater than 50% respiratory variability, suggesting right atrial pressure of 3 mmHg. IAS/Shunts: Cannot exclude a small PFO.  LEFT VENTRICLE PLAX 2D LVIDd:         6.00 cm LVIDs:         5.30 cm LV PW:         1.50 cm LV IVS:        1.40 cm LVOT diam:     2.30 cm LV SV:         53 LV SV Index:   23 LVOT Area:     4.15 cm  LV Volumes (MOD) LV vol d, MOD A2C: 168.0 ml LV vol d, MOD A4C: 162.0 ml LV vol s, MOD A2C: 143.0 ml LV vol s, MOD A4C: 111.0 ml LV SV MOD A2C:     25.0 ml LV SV MOD A4C:     162.0 ml LV SV MOD BP:      40.7 ml RIGHT VENTRICLE            IVC RV Basal diam:  4.30 cm    IVC diam: 1.30 cm RV S prime:     9.18 cm/s TAPSE (M-mode): 1.9 cm LEFT ATRIUM              Index        RIGHT ATRIUM           Index LA diam:        5.10 cm  2.23 cm/m   RA  Area:     17.10 cm LA Vol (A2C):   105.0 ml 45.98 ml/m  RA Volume:   49.90 ml  21.85 ml/m LA Vol (A4C):   94.6 ml  41.42 ml/m LA Biplane Vol: 104.0 ml 45.54 ml/m  AORTIC VALVE                    PULMONIC VALVE AV Area (Vmax):    3.16 cm     PV Vmax:       0.92 m/s AV Area (Vmean):   2.89 cm     PV Peak grad:  3.4 mmHg AV Area (VTI):     2.88 cm AV Vmax:           106.40 cm/s AV Vmean:          70.600 cm/s AV VTI:            0.186 m AV Peak Grad:      4.5 mmHg AV Mean Grad:  2.7 mmHg LVOT Vmax:         80.97 cm/s LVOT Vmean:        49.175 cm/s LVOT VTI:          0.129 m LVOT/AV VTI ratio: 0.69  AORTA Ao Root diam: 3.10 cm Ao Asc diam:  3.60 cm MITRAL VALVE MV Area (PHT): 4.73 cm    SHUNTS MV Decel Time: 160 msec    Systemic VTI:  0.13 m MV E velocity: 74.53 cm/s  Systemic Diam: 2.30 cm MV A velocity: 62.93 cm/s MV E/A ratio:  1.18 Jodelle Red MD Electronically signed by Jodelle Red MD Signature Date/Time: 08/23/2023/12:06:46 PM    Final      Medications:     Scheduled Medications:  Chlorhexidine Gluconate Cloth  6 each Topical Daily   digoxin  0.125 mg Oral Daily   empagliflozin  10 mg Oral Daily   insulin aspart  0-15 Units Subcutaneous TID WC   losartan  50 mg Oral Daily   rivaroxaban  20 mg Oral Q supper   rosuvastatin  10 mg Oral Daily   sodium chloride flush  10-40 mL Intracatheter Q12H   sodium chloride flush  3 mL Intravenous Q12H   spironolactone  12.5 mg Oral Daily    Infusions:    PRN Medications: acetaminophen, bismuth subsalicylate, mouth rinse, sodium chloride flush, sodium chloride flush    Patient Profile   Justin [name] is a 42 y.o. Spanish-speaking male with history of chronic systolic CHF, hx uncontrolled HTN, DM II, mitral regurgitation, ETOH abuse, hx "crack"/cocaine use.   Admitted for CHF exacerbation  Assessment/Plan   Acute on Chronic combined systolic and diastolic CHF: - Suspect CM most likely d/t HCM  from uncontrolled HTN in setting of noncompliance, ETOH abuse, & cocaine abuse.  - Echo (6/23): EF 30-35%, RV okay, mild to moderate MR. - R/LHC (6/23): No significant CAD, RA mean 1, PA mean 19, PCWP 3, LVEDP 7, Fick CO/CI 4.17/1.99 - cMRI (6/23): LVEF 29%, LV severely dilated, asymmetric LVH with basal septum measuring 17 mm, RVEF 45%, patchy LGE in setpum and RV insertion sites + subepicardial LGE in inferolateral wall. Scar pattern felt to be consistent with HCM. However, d/t subepicardial LGE in inferolateral wall and presence of mediastinal lymphadenopathy, cardiac PET recommended to r/o sarcoid, small pericardial effusion.  - Echo (9/23): EF 15-20%, GIIIDD, RV mildly dilated and mod reduced function, Mod MR, trivial TR - Echo (6/24): EF 20-25% gHK, GIIDD, RV mod reduced, mild MVR, IVC <50% variability. - Echo this admit: EF 20-25%, RV mildly reduced, IVC not dilated - Admitted with severe volume overload. Lactic acid 1.2  - Volume looks good today. CVP 4. Start Torsemide 20 mg daily.  - Continue Losartan 50 mg daily (Entresto stopped as he has not submitted financial info for assistance). - Continue Spiro 12.5 mg daily - Continue Jardiance 10 mg daily - Continue digoxin 0.125 mg daily - Hold bb with acute exacerbation - Reiterated importance of compliance with medications and follow-up appointments.   2. Atrial fibrillation:  - Diagnosed 1/24. Previously on Xarelto, although historically noncompliant with a/c - In SR. Continue Xarelto 20 mg daily. - Needs sleep study, but uninsured   3. HTN: Uncontrolled on admit. - Improved. - GDMT as above   4. Polysubstance abuse: ETOH and crack/cocaine use.  - UDS negative - Reported last cocaine use 1 month ago - States he continues to drink beer. - Cessation imperative.   5. DM II:  Hgb A1c 7.8 . He is not on insulin. - Continue SGLT2i. No GU symptoms.   6. CKD II: baseline SCr 1.3-1.5. Cardiorenal.   - Scr stable 1.6 today.   7.  Hyperlipidemia: LDL 137 (6/24) - Resumed statin    Will arrange follow-up in HF clinic. Will reach out to HF CSW for assistance with transportation to appointments.   Meds under HF fund at discharge.   Length of Stay: 2  FINCH, LINDSAY N, PA-C  08/24/2023, 7:00 AM  Advanced Heart Failure Team Pager [phone] (M-F; 7a - 5p)  Please contact [facility] for night-coverage after hours (5p -7a ) and weekends on [email or web address]   Patient seen and examined with the above-signed Advanced Practice Provider and/or Housestaff. I personally reviewed laboratory data, imaging studies and relevant notes. I independently examined the patient and formulated the important aspects of the plan. I have edited the note to reflect any of my changes or salient points. I have personally discussed the plan with the patient and/or family.  Has diuresed well. CVP 2-3 (checked personally) Co-ox ok  Denies CP or SOB. In SR  General:  Lying flat in bed. No resp difficulty HEENT: normal Neck: supple. no JVD. Carotids 2+ bilat; no bruits. No lymphadenopathy or thryomegaly appreciated. Cor: PMI nondisplaced. Regular rate & rhythm. No rubs, gallops or murmurs. Lungs: clear Abdomen: obese soft, nontender, nondistended. No hepatosplenomegaly. No bruits or masses. Good bowel sounds. Extremities: no cyanosis, clubbing, rash, edema Neuro: alert & orientedx3, cranial nerves grossly intact. moves all 4 extremities w/o difficulty. Affect pleasant  He is much improved. Ok for d/c from our standpoint.   HF meds for d/c   Losartan 50 mg daily (Entresto stopped as he has not submitted financial info for assistance). Spiro 25 mg daily Jardiance 10 mg daily Digoxin 0.125 mg daily Torsemide 20 daily K dur 20 daily  Needs to pick up meds from [facility] tomorrow.   We wil sign off. HF f/u arranged.   Arvilla Meres, MD  4:45 PM

## 2023-08-24 NOTE — Telephone Encounter (Signed)
Called pt to schedule HFU. Pt did not answer and could not leave VM. Please advise

## 2023-08-24 NOTE — Progress Notes (Addendum)
Summary: [name] is a 42 year old spanish speaking person with PMH of HFrEF EF 20-25% 6/24, Atrial fibrillation, CKD stage 3a, diabetes who presented with 3-4 days of progressive dyspnea and was admitted to the Internal Medicine Teaching Service on 12/11 for acute on chronic HF exacerbation.   Subjective:  Day #2. No overnight events reported.   Patient on 4L O2 initially, desaturated to 88% when O2 turned off, increased to 2L in room with good O2 saturation. Patient states he feels more alert today, previously felt sleepy. Does not feel any CP or shortness of breath with O2 on. Wore O2 on while ambulating earlier today. Denied any tobacco use at home. Endorses intermittent coughing that feels like it might be productive but nothing has come up, feels like he has mucous in his throat. Has been voiding without issues. No acute complaints, feels like his leg swelling is resolved, back to baseline. Answered questions about his PICC line placement and echocardiogram from yesterday. Patient instructed on incentive spirometer use.   Objective:  Vital signs in last 24 hours: Vitals:   08/24/23 0511 08/24/23 0636 08/24/23 0752 08/24/23 0755  BP: 109/80  135/85 135/85  Pulse: 88   83  Resp: 16   (!) 25  Temp: 98.4 F (36.9 C)  97.9 F (36.6 C)   TempSrc: Oral  Oral   SpO2: 96%   90%  Weight:  113.9 kg    Height:          Latest Ref Rng & Units 08/24/2023    3:54 AM 08/23/2023    6:04 AM 08/22/2023    9:10 AM  CBC  WBC 4.0 - 10.5 K/uL 8.6  8.1    Hemoglobin 13.0 - 17.0 g/dL 63.8  75.6  43.3   Hematocrit 39.0 - 52.0 % 49.1  51.1  47.0   Platelets 150 - 400 K/uL 284  271         Latest Ref Rng & Units 08/24/2023    3:54 AM 08/23/2023    8:11 AM 08/22/2023    9:38 AM  BMP  Glucose 70 - 99 mg/dL 295  188  416   BUN 6 - 20 mg/dL 33  25  19   Creatinine 0.61 - 1.24 mg/dL 6.06  3.01  6.01   Sodium 135 - 145 mmol/L 136  138  133   Potassium 3.5 - 5.1 mmol/L 3.2  3.5  4.6    Chloride 98 - 111 mmol/L 90  93  96   CO2 22 - 32 mmol/L 33  29  29   Calcium 8.9 - 10.3 mg/dL 9.2  9.8  9.2      Intake/Output Summary (Last 24 hours) at 08/24/2023 1111 Last data filed at 08/24/2023 0800 Gross per 24 hour  Intake 240 ml  Output 5200 ml  Net -4960 ml     CVO2 from PICC 68.1 today   Physical Exam Constitutional: Patient is resting in bed in no acute distress, conversing appropriately. CV: Regular rate and rhythm without murmurs on auscultation. No BLE, per patient at baseline. Warm extremities.  Pulmonary/Respiratory: Normal respiratory effort on 2 L O2 via [place]. Clear lungs bilaterally.  Abdominal: Soft, non-tender, non-distended.  Skin: Warm and dry.  Psych: Normal mood and affect.   Assessment/Plan:  Principal Problem:   Acute exacerbation of CHF (congestive heart failure) (HCC) Active Problems:   Acute on chronic systolic (congestive) heart failure (HCC)   Acute on chronic heart failure (HCC)  Justin [name] Justin [name] is a 42 y.o. person living with a history of HFrEF EF 20-25% 6/24, Atrial fibrillation, CKD stage 3a, and T2DM who presented with dyspnea and was admitted to the Internal Medicine Teaching Service for acute on chronic heart failure exacerbation.  Advanced heart failure, EF 20 to 25% Acute on chronic HF exacerbation  Hypertension  Patient with no acute complaints on exam today. Still requiring supplemental O2 (on 2L currently) despite appearing euvolemic on exam and no lab or imaging findings indicating URI or imaging findings concerning for pneumonia/infection.   Net diuresis following IV diuresis -4.8L, CVO2 from PICC improved to 68.1 today from 66.3 yesterday. Physical exam unremarkable. Extremities remain warm. Will resume PO diuretic today, HF recommending torsemide 20 mg (on torsemide 40 mg at home). Patient close to discharge, will discuss continued O2 requirement with HF. Patient will be going to cardiac rehabilitation at  discharge.  Plan:  -Appreciate heart failure team recommendations -Continue spironolactone 12.5 mg, losartan 40 mg daily, and Jardiance 10 mg  -Continue digoxin 0.125 mg daily  -Holding beta-blocker  -START PO torsemide 20 mg daily, may increase to 40 mg home dose  - F/u CXR  -Wean down O2 as tolerated, incentive spirometer provided -Ambulatory pulse ox -PT/OT  -Daily weights -strict I's and O's -telemetry monitoring   Elevated troponin, peaked ST elevation in V2, V3 Troponin initially elevated to 111-126 on repeat. Patient denies chest pain. Last troponin decreased to 96. On telemetry. Consider repeating EKG if patient has new chest pain.    Type 2 diabetes Last A1c 7.8% January 2024. On Jardiance 10 mg daily. Patient on moderate SSI.    CKD stage IIIa Baseline serum creatinine 1.6-1.7, creatinine today Cr 1.61. Will plan to continue diuresing. Monitor on daily BMP.     History of paroxysmal atrial fibrillation Previously on Xarelto but has not taken in some time. Started on IV heparin for A-fib on admission, transitioned to DOAC 12/12.  - Continue Xarelto 15 mg daily  HLD Chronic, LDL 137 as of June 2024. Resumed Rosuvastatin 10 mg daily.   SDOH Patient will need meds sent with him at discharge and close PCP (NP Justin [name], family medicine)  and cardiology follow-up at discharge. Team will consult Justin [name] pharmD prior to discharge to help assist with medications.   Diet: Carb modified VTE: DOAC IVF: None, None Code: Full   Prior to Admission Living Arrangement: Home Anticipated Discharge Location: Pending medical management Barriers to Discharge: Pending medical management Dispo: Anticipated discharge in approximately 1-2 day (s).   Justin Doheny, MD, PGY-1 08/24/2023, 7:58 AM Pager: [phone] After 5pm on weekdays and 1pm on weekends: On Call pager [phone]

## 2023-08-24 NOTE — Telephone Encounter (Signed)
Copied from CRM [id number]. Topic: Appointment Scheduling - Scheduling Inquiry for Clinic >> Aug 23, 2023  1:03 PM Albin Felling L wrote: Reason for CRM: Pt will be getting discharged from [facility] either today or tomorrow. Pt needing hospital f/u with Gwinda Passe, NP. Did not see any available appointments within the 14 days of discharge.   Please call pt with interpreter to schedule hospital f/u

## 2023-08-24 NOTE — Discharge Instructions (Signed)

## 2023-08-24 NOTE — Progress Notes (Signed)
CARDIAC REHAB PHASE I   PRE:  Rate/Rhythm: 92 SR with PACs  BP:  Sitting: 133/85      SpO2: 95 4L   MODE:  Ambulation: 370 ft    POST:  Rate/Rhythm: 103 ST  BP:  Sitting: 148/100      SpO2: 92-94 4L  Pt ambulated with RW in the hallway requiring standby assistance. Pt walked well w/o concerning symptoms. Pt educated using Radiation protection practitioner.  Pt received HF book and was educated on LE edema and other concerning s/s, recording daily weights and when to call provider, sodium reduction (printout provided), importance of taking meds and physical activity, and fluid restriction. Will refer to CR at [facility].    Faustino Congress  MS, ACSM-CEP 9:11 AM 08/24/2023    Service time is from 0830 to 0911.

## 2023-08-24 NOTE — Inpatient Diabetes Management (Signed)
Inpatient Diabetes Program Recommendations  AACE/ADA: New Consensus Statement on Inpatient Glycemic Control (2015)  Target Ranges:  Prepandial:   less than 140 mg/dL      Peak postprandial:   less than 180 mg/dL (1-2 hours)      Critically ill patients:  140 - 180 mg/dL    Latest Reference Range & Units 08/23/23 06:13 08/23/23 11:03 08/23/23 16:35 08/23/23 21:19  Glucose-Capillary 70 - 99 mg/dL 427 (H)  2 units Novolog  231 (H)  5 units Novolog  192 (H)  3 units Novolog  186 (H)  (H): Data is abnormally high  Latest Reference Range & Units 08/24/23 06:31 08/24/23 11:25  Glucose-Capillary 70 - 99 mg/dL 062 (H)  5 units Novolog  251 (H)  (H): Data is abnormally high    Home DM Meds: Jardiance 10 mg daily   Current Orders: Jardiance 10 mg daily                            Novolog Moderate Correction Scale/ SSI (0-15 units) TID AC    MD- Please consider starting Novolog Meal Coverage: Novolog 4 units TID with meals HOLD if pt NPO HOLD if pt eats <50% meals     --Will follow patient during hospitalization--  Ambrose Finland RN, MSN, CDCES Diabetes Coordinator Inpatient Glycemic Control Team Team Pager: [phone] (8a-5p)

## 2023-08-24 NOTE — Hospital Course (Addendum)
[facility] hospital follow up   8:45 AM next Friday 12/20 with Dr. [name]  Will need to cancel family medicine follow up  ----------------- Advanced heart failure, EF 20 to 25% Acute on chronic HF exacerbation  Hypertension   Patient with no acute complaints on exam today. Still requiring supplemental O2 (on 2L currently) despite appearing euvolemic on exam and no lab or imaging findings indicating URI or imaging findings concerning for pneumonia/infection.    Net diuresis following IV diuresis -4.8L, CVO2 from PICC improved to 68.1 today from 66.3 yesterday. Physical exam unremarkable. Extremities remain warm. Will resume PO diuretic today, HF recommending torsemide 20 mg (on torsemide 40 mg at home). Patient close to discharge, will discuss continued O2 requirement with HF. Patient will be going to cardiac rehabilitation at discharge.  Plan:  -Appreciate heart failure team recommendations -Continue spironolactone 12.5 mg, losartan 40 mg daily, and Jardiance 10 mg  -Continue digoxin 0.125 mg daily  -Holding beta-blocker  -START PO torsemide 20 mg daily, may increase to 40 mg home dose  - F/u CXR  -Wean down O2 as tolerated, incentive spirometer provided -Ambulatory pulse ox -PT/OT  -Daily weights -strict I's and O's -telemetry monitoring   Elevated troponin, peaked ST elevation in V2, V3 Troponin initially elevated to 111-126 on repeat. Patient denies chest pain. Last troponin decreased to 96. On telemetry. Remained stable throughout admission.   Chronic medical problems:   Type 2 diabetes Last A1c 7.8% January 2024. Resumed home Jardiance 10 mg daily on admission. Patient placed on moderate SSI. Patient discharged on Jardiance 10 mg daily.    CKD stage IIIa Baseline serum creatinine 1.6-1.7, creatinine today Cr 1.61. Monitor with outpatient BMP.    History of paroxysmal atrial fibrillation Previously on Xarelto but has not taken in some time. Started on IV heparin for A-fib on  admission, transitioned to DOAC 12/12. Will continue Xarelto 15 mg daily at discharge.    HLD Chronic, LDL 137 as of June 2024. Resumed Rosuvastatin 10 mg daily.    SDOH Patient does not have medical insurance. Will need medication assistance and support to get additional services.

## 2023-08-24 NOTE — Telephone Encounter (Signed)
Called pt to schedule HFU. Pt did not answer and I could not leave a VM. Please advise.

## 2023-08-25 ENCOUNTER — Other Ambulatory Visit (HOSPITAL_COMMUNITY): Payer: Self-pay

## 2023-08-25 DIAGNOSIS — Z7901 Long term (current) use of anticoagulants: Secondary | ICD-10-CM

## 2023-08-25 DIAGNOSIS — N1831 Chronic kidney disease, stage 3a: Secondary | ICD-10-CM

## 2023-08-25 DIAGNOSIS — Z7984 Long term (current) use of oral hypoglycemic drugs: Secondary | ICD-10-CM

## 2023-08-25 DIAGNOSIS — Z8679 Personal history of other diseases of the circulatory system: Secondary | ICD-10-CM

## 2023-08-25 DIAGNOSIS — E1122 Type 2 diabetes mellitus with diabetic chronic kidney disease: Secondary | ICD-10-CM

## 2023-08-25 LAB — BASIC METABOLIC PANEL
Anion gap: 11 (ref 5–15)
BUN: 29 mg/dL — ABNORMAL HIGH (ref 6–20)
CO2: 32 mmol/L (ref 22–32)
Calcium: 9.1 mg/dL (ref 8.9–10.3)
Chloride: 96 mmol/L — ABNORMAL LOW (ref 98–111)
Creatinine, Ser: 1.61 mg/dL — ABNORMAL HIGH (ref 0.61–1.24)
GFR, Estimated: 54 mL/min — ABNORMAL LOW (ref >60)
Glucose, Bld: 143 mg/dL — ABNORMAL HIGH (ref 70–99)
Potassium: 3.6 mmol/L (ref 3.5–5.1)
Sodium: 139 mmol/L (ref 135–145)

## 2023-08-25 LAB — COOXEMETRY PANEL
Carboxyhemoglobin: 1.7 % — ABNORMAL HIGH (ref 0.5–1.5)
Methemoglobin: 0.7 % (ref 0.0–1.5)
O2 Saturation: 68.6 %
Total hemoglobin: 16.5 g/dL — ABNORMAL HIGH (ref 12.0–16.0)

## 2023-08-25 LAB — GLUCOSE, CAPILLARY
Glucose-Capillary: 152 mg/dL — ABNORMAL HIGH (ref 70–99)
Glucose-Capillary: 299 mg/dL — ABNORMAL HIGH (ref 70–99)

## 2023-08-25 LAB — CBC
HCT: 48.5 % (ref 39.0–52.0)
Hemoglobin: 16.3 g/dL (ref 13.0–17.0)
MCH: 30.5 pg (ref 26.0–34.0)
MCHC: 33.6 g/dL (ref 30.0–36.0)
MCV: 90.7 fL (ref 80.0–100.0)
Platelets: 250 10*3/uL (ref 150–400)
RBC: 5.35 MIL/uL (ref 4.22–5.81)
RDW: 13 % (ref 11.5–15.5)
WBC: 6.8 10*3/uL (ref 4.0–10.5)
nRBC: 0 % (ref 0.0–0.2)

## 2023-08-25 MED ORDER — SPIRONOLACTONE 25 MG PO TABS
25.0000 mg | ORAL_TABLET | Freq: Every day | ORAL | 3 refills | Status: DC
  Filled 2023-08-25 – 2023-10-26 (×2): qty 30, 30d supply, fill #0

## 2023-08-25 MED ORDER — ROSUVASTATIN CALCIUM 10 MG PO TABS
10.0000 mg | ORAL_TABLET | Freq: Every day | ORAL | 3 refills | Status: DC
  Filled 2023-08-25 – 2023-10-26 (×2): qty 30, 30d supply, fill #0

## 2023-08-25 MED ORDER — DIGOXIN 125 MCG PO TABS
0.1250 mg | ORAL_TABLET | Freq: Every day | ORAL | 1 refills | Status: DC
  Filled 2023-08-25 – 2023-10-26 (×2): qty 30, 30d supply, fill #0

## 2023-08-25 MED ORDER — RIVAROXABAN 20 MG PO TABS
20.0000 mg | ORAL_TABLET | Freq: Every day | ORAL | 1 refills | Status: DC
  Filled 2023-08-25: qty 30, 30d supply, fill #0

## 2023-08-25 MED ORDER — POTASSIUM CHLORIDE CRYS ER 20 MEQ PO TBCR
20.0000 meq | EXTENDED_RELEASE_TABLET | Freq: Every day | ORAL | 1 refills | Status: DC
Start: 2023-08-25 — End: 2024-03-10
  Filled 2023-08-25 – 2023-10-26 (×2): qty 30, 30d supply, fill #0

## 2023-08-25 MED ORDER — LOSARTAN POTASSIUM 50 MG PO TABS
50.0000 mg | ORAL_TABLET | Freq: Every day | ORAL | 1 refills | Status: DC
  Filled 2023-08-25 – 2023-10-26 (×2): qty 30, 30d supply, fill #0

## 2023-08-25 MED ORDER — TORSEMIDE 20 MG PO TABS
20.0000 mg | ORAL_TABLET | Freq: Every day | ORAL | 1 refills | Status: DC
  Filled 2023-08-25: qty 30, 30d supply, fill #0
  Filled 2023-10-26: qty 30, 30d supply, fill #1
  Filled 2023-10-26: qty 30, 30d supply, fill #0

## 2023-08-25 MED ORDER — EMPAGLIFLOZIN 10 MG PO TABS
10.0000 mg | ORAL_TABLET | Freq: Every day | ORAL | 1 refills | Status: DC
  Filled 2023-08-25: qty 30, 30d supply, fill #0

## 2023-08-25 NOTE — Progress Notes (Signed)
CARDIAC REHAB PHASE I   PRE:  Rate/Rhythm: 93 SR  BP:  Sitting: 146/110      SaO2: 90 2L  MODE:  Ambulation: 370 ft   POST:  Rate/Rhythm: 104 ST  BP:  Sitting: 151/74      SaO2: 87-88% on 0L, 89-90% on 2L  Pt tolerated exercise well and amb 370 ft pushing oxygen tank and stand-by assist. Pt denies CP, SOB, or dizziness throughout walk. Pt to bed with breakfast tray near. Will continue to follow.  [id number] Joya San, MS, ACSM-CEP 08/25/2023 8:24 AM

## 2023-08-25 NOTE — Evaluation (Signed)
Physical Therapy Evaluation Patient Details Name: Justin [name] MRN: [id number] DOB: 1981-01-01 Today's Date: 08/25/2023  History of Present Illness  Patient is a 42 yo male presenting to the ED on 06/02/22 with SOB and cough. Patient admitted with CHGF exacerbation and elevated tropinin. PMH includes: hypertension, hyperlipidemia, nonischemic cardiomyopathy, recent ECHO with 35% EF, DM II, mitral regurgitation, ETOH abuse, hx "crack"/cocaine use.  Clinical Impression  Pt is presenting close to baseline level of functioning. Pt was able to ambulate 500 ft at supervision level due to O2 sats; titrated up to 3 L via [place] in order to keep O2 sats above 88%. Unclear if pt uses O2 at home or not; it pt uses O2 at home then pt is most likely at baseline level of functioning and will require education on the importance of use of O2 when  mobilizing. Due to pt current functional status, home set up and available assistance at home no recommended skilled physical therapy services at this time on discharge from acute care hospital setting. Will continue to follow in acute setting in order to ensure that pt returns home with decreased risk for falls, injury, re-hospitalization and improved activity tolerance.          If plan is discharge home, recommend the following: Help with stairs or ramp for entrance     Equipment Recommendations None recommended by PT     Functional Status Assessment Patient has had a recent decline in their functional status and demonstrates the ability to make significant improvements in function in a reasonable and predictable amount of time.     Precautions / Restrictions Precautions Precautions: None Restrictions Weight Bearing Restrictions Per Provider Order: No      Mobility  Bed Mobility Overal bed mobility: Independent      Transfers Overall transfer level: Independent Equipment used: None        Ambulation/Gait Ambulation/Gait assistance:  Supervision Gait Distance (Feet): 500 Feet Assistive device: None Gait Pattern/deviations: Step-through pattern Gait velocity: decreased Gait velocity interpretation: 1.31 - 2.62 ft/sec, indicative of limited community ambulator   General Gait Details: Supervision due to O2 sats down to 84% on room air difficult to get back up over 90% up to 3L during gait to keep O2 sats at 88-92%      Balance Overall balance assessment: No apparent balance deficits (not formally assessed)       Pertinent Vitals/Pain Pain Assessment Pain Assessment: No/denies pain    Home Living Family/patient expects to be discharged to:: Private residence Living Arrangements: Other relatives (lives with 4 friends, mom/dad and siblings leave nearby) Available Help at Discharge: Family;Available PRN/intermittently Type of Home: House Home Access: Stairs to enter Entrance Stairs-Rails: None Entrance Stairs-Number of Steps: 5   Home Layout: One level Home Equipment: None Additional Comments: unclear if pt has O2 at home or not. Pt states he does not use O2 but nursing states pt has O2 at home he just does not use it.    Prior Function Prior Level of Function : Independent/Modified Independent;Driving             Mobility Comments: Works a Education administrator ADLs Comments: Ind with ADL's and IADL's.     Extremity/Trunk Assessment   Upper Extremity Assessment Upper Extremity Assessment: Overall WFL for tasks assessed    Lower Extremity Assessment Lower Extremity Assessment: Overall WFL for tasks assessed    Cervical / Trunk Assessment Cervical / Trunk Assessment: Normal  Communication   Communication Communication: Other (comment) (interpreter  utilized Deno Lunger [id number])  Cognition Arousal: Alert Behavior During Therapy: WFL for tasks assessed/performed Overall Cognitive Status: Within Functional Limits for tasks assessed      General Comments General comments (skin integrity, edema, etc.): No noted  skin issues outside limits of gown.        Assessment/Plan    PT Assessment Patient needs continued PT services  PT Problem List Decreased activity tolerance       PT Treatment Interventions Therapeutic exercise;Gait training;Stair training;Functional mobility training;Therapeutic activities;Patient/family education    PT Goals (Current goals can be found in the Care Plan section)  Acute Rehab PT Goals Patient Stated Goal: Improve O2 and go home PT Goal Formulation: With patient Time For Goal Achievement: 09/08/23 Potential to Achieve Goals: Good    Frequency Min 1X/week        AM-PAC PT "6 Clicks" Mobility  Outcome Measure Help needed turning from your back to your side while in a flat bed without using bedrails?: None Help needed moving from lying on your back to sitting on the side of a flat bed without using bedrails?: None Help needed moving to and from a bed to a chair (including a wheelchair)?: None Help needed standing up from a chair using your arms (e.g., wheelchair or bedside chair)?: None Help needed to walk in hospital room?: A Little Help needed climbing 3-5 steps with a railing? : A Little 6 Click Score: 22    End of Session Equipment Utilized During Treatment: Oxygen Activity Tolerance: Patient tolerated treatment well Patient left: in bed;with call bell/phone within reach Nurse Communication: Mobility status;Other (comment) (O2 needs) PT Visit Diagnosis: Other abnormalities of gait and mobility (R26.89)    Time: 0454-0981 PT Time Calculation (min) (ACUTE ONLY): 23 min   Charges:   PT Evaluation $PT Eval Low Complexity: 1 Low PT Treatments $Therapeutic Activity: 8-22 mins PT General Charges $$ ACUTE PT VISIT: 1 Visit        Harrel Carina, DPT, CLT  Acute Rehabilitation Services Office: [phone] (Secure chat preferred)   Claudia Desanctis 08/25/2023, 9:52 AM

## 2023-08-25 NOTE — Progress Notes (Signed)
SATURATION QUALIFICATIONS: (This note is used to comply with regulatory documentation for home oxygen)  Patient Saturations on Room Air at Rest = 89%  Patient Saturations on Room Air while Ambulating = 84%  Patient Saturations on 3 Liters of oxygen while Ambulating = 92%

## 2023-08-25 NOTE — Discharge Summary (Addendum)
Name: Justin [name] MRN: [id number] DOB: 11-Jul-1981 42 y.o. PCP: No primary care provider on file.  Date of Admission: 08/22/2023  7:09 AM Date of Discharge: 08/25/2023 6:36 PM Attending Physician: Dr. Sol Blazing  Discharge Diagnosis: Principal Problem:   Acute exacerbation of CHF (congestive heart failure) (HCC) Active Problems:   Acute on chronic systolic (congestive) heart failure (HCC)   Acute on chronic heart failure [facility]    Discharge Medications: Allergies as of 08/25/2023   No Known Allergies      Medication List     STOP taking these medications    metoprolol succinate 100 MG 24 hr tablet Commonly known as: TOPROL-XL   sacubitril-valsartan 24-26 MG Commonly known as: ENTRESTO       TAKE these medications    digoxin 0.125 MG tablet Commonly known as: LANOXIN Take 1 tablet (0.125 mg total) by mouth daily. Start taking on: August 26, 2023   Jardiance 10 MG Tabs tablet Generic drug: empagliflozin Tome 1 tableta (10 mg en total) por va oral diariamente. (Take 1 tablet (10 mg total) by mouth daily.) Start taking on: August 26, 2023   losartan 50 MG tablet Commonly known as: COZAAR Tome 1 tableta (50 mg en total) por va oral diariamente. (Take 1 tablet (50 mg total) by mouth daily.) Start taking on: August 26, 2023   potassium chloride SA 20 MEQ tablet Commonly known as: KLOR-CON M Tome una tableta (20 mEq en total) por va oral diariamente. (Take 1 tablet (20 mEq total) by mouth daily.)   rosuvastatin 10 MG tablet Commonly known as: CRESTOR Tome 1 tableta (10 mg en total) por va oral diariamente. (Take 1 tablet (10 mg total) by mouth daily.)   spironolactone 25 MG tablet Commonly known as: ALDACTONE Tome 1 tableta (25 mg en total) por va oral diariamente. (Take 1 tablet (25 mg total) by mouth daily.)   torsemide 20 MG tablet Commonly known as: DEMADEX Tome 1 tableta (20 mg en total) por va oral diariamente. (Take 1  tablet (20 mg total) by mouth daily.) Start taking on: August 26, 2023 What changed: how much to take   Xarelto 20 MG Tabs tablet Generic drug: rivaroxaban Take 1 tablet (20 mg total) by mouth daily with supper.        Disposition and follow-up:   Justin [name] was discharged from [facility] in Stable condition.  At the hospital follow up visit please address:  1.  Follow-up:  GDMT for HF  - Patient discharged on torsemide, losartan, spironolactone, jardiance, digoxin, and potassium. Please ensure that he has been taking these medications consistently, has access to refills. Dry weight on discharge 250 pounds. Seems to be back at baseline with O2 requirement. Please assess and make adjustments as necessary if HF/cardiology has not already. Please check to see that patient has been called about cardiac rehabilitation and is following up with HF/cardiology.   Sleep medicine referral  - Patient's O2 saturation levels decrease at night, suspect OSA. Patient is not insured but would benefit from sleep study/sleep medicine referral when he is able to get some insurance or financial assistance.   SDOH  - Patient will need help to apply for financial/insurance assistance. Financially limited in his ability to obtain medications and go to medical visits consistently.    Type 2 diabetes - Hgb A1c 7.8% January 2024. On Jardiance 10 mg daily but has not taken consistently. Please consider repeat Hgb A1c and adjust medications as  needed.    CKD stage 3a  - Patient at baseline Cr 1.61 (1.6-1.7) on day of discharge. On medications that can impact serum creatinine. Please consider checking BMP outpatient.     History of paroxysmal atrial fibrillation  - Discharged on Xarelto 20 mg daily which he had not been taking for some time. Afib stable during admission. Please follow up that patient remains asymptomatic and has refills.    2.  Labs / imaging needed  at time of follow-up: BMP, blood pressure check, Hgb A1c   3.  Pending labs/ test needing follow-up: n/a  4.  Medication Changes  STOPPED  - Metoprolol   - Entresto    ADDED  - Losartan   - Digoxin   - Potassium    MODIFIED  - Spironolactone  - Torsemide    Follow-up Appointments:  Follow-up Information     [facility] Follow up on 08/30/2023.   Specialty: Cardiology Why: Dr. Shirlee [name] 2:20 PM Contact information: [address] [place] [address] [phone]        [facility] Follow up.   Contact information: [address] [place] [address] [phone]                Hospital Course by problem list: Advanced heart failure, EF 20 to 25% Acute on chronic HF exacerbation  Hypertension Patient with 3 similar admissions this year. Echo from this admission without significant change from prior study, with LVEF 20-25%. HF team consulted in ED, followed patient throughout admission. Recent exacerbation likely due to poorly controlled hypertension and medication non-adherence due to financial constraints. Patient has not been able to take any of his GDMT provided at discharge during his last hospitalization in June as he cannot afford them and is not established with a PCP. Patient also with history of exacerbating factors like alcohol and cocaine, UDS on admission negative.   Patient initially presented with bilateral crackles, BLE, and increased O2 requirement. Endorsed orthopnea. Patient states he was given O2 for home use after his last hospitalization in June. Has been using up to 3L of O2, which has been helping him with symptoms. Respiratory panel negative for URI and CXR negative for consolidation. Required up to 5L O2 during admission. Patient was initially diuresed with IV lasix and metolazone, then transitioned to PO torsemide 20 mg daily with CVO2 improving from 50.4% on admission  to 68.6% on day of discharge. Patient previously on Entresto 24-26 daily which was changed to Losartan 40 mg daily due to financial constraints. Discharged with torsemide 20 mg daily. Spironolactone 12.5 mg increased to 25 mg daily. HF team added digoxin 0.125 mg daily and potassium 20 mEq (one tablet) daily. Held home metoprolol 100 mg daily due to low output heart failure. Patient able to wean down to 1.5 L O2 at discharge, with an increase to 3 L O2 with exertion. Discharged with O2 for transport and will be able to use O2 device at home. Recommend sleep medicine referral for possible OSA.   Counseled patient on importance of medication adherence, close follow up with PCP, cardiology, cardiac rehabilitation, and cessation of cocaine and other substance use. Patient unable to attend scheduled PCP and cardiology follow up appointments as he will be going out of town for work early this week. States he cannot miss this work opportunity as he has been out of work for some time, which is what led to him not being  able to afford refills for his medications. Patient provided for numbers to Internal Medicine Clinic, CHF clinic, and oxygen supply company. He endorsed understanding and will also follow up on call to resume cardiac rehabilitation once he returns from his work trip.    Elevated troponin, peaked ST elevation in V2, V3 Troponin initially elevated to 111-126 on repeat. Patient denies chest pain. Last troponin decreased to 96. On telemetry. Remained stable throughout admission.   Chronic medical problems:   Type 2 diabetes Last A1c 7.8% January 2024. Resumed home Jardiance 10 mg daily on admission. Patient placed on moderate SSI. Patient discharged on Jardiance 10 mg daily.    CKD stage IIIa Baseline serum creatinine 1.6-1.7, creatinine today Cr 1.61. Monitor with outpatient BMP.    History of paroxysmal atrial fibrillation Previously on Xarelto but has not taken in some time. Started on IV  heparin for A-fib on admission, transitioned to DOAC 12/12. Will continue Xarelto 20 mg daily at discharge.    HLD Chronic, LDL 137 as of June 2024. Resumed Rosuvastatin 10 mg daily.    SDOH Patient does not have medical insurance. Will need medication assistance and support to get additional services.    Discharge Subjective: Day #3. Patient is alert and engaged in conversation. States he feels better than yesterday, no acute complaints other than some slight stomach upset after eating lunch and which he has experienced in the past. Clarified that he does have O2 available at home. 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 Has not been employed for some time and really needs to go. Recommended taking it easy today and tomorrow and resuming activity as tolerated. Discussed importance of following up with PCP (establishing with [facility] hospital), HF clinic, and participating in cardiac rehabilitation. Patient expressed understanding and he was provided with contact information for PCP and HF clinic so he can reschedule his appointments. Patient received all medications in his room prior to discharge and we reviewed patient instructions. Will get ride from a friend today with TOC arranging for O2 for transport.   Discharge Exam:   Blood pressure (!) 146/110, pulse 98, temperature 98.6 F (37 C), temperature source Oral, resp. rate 20, height 5\' 8"  (1.727 m), weight 113.5 kg, SpO2 93%.  Constitutional: Patient is sitting on the edge of the bed in no acute distress. Conversing appropriately.  HENT: normocephalic atraumatic, mucous membranes moist. Cardiovascular: regular rate and rhythm, no m/r/g/, warm extremities, trace BLE edema that patient states is baseline.  Pulmonary/Chest: normal work of breathing on 1.5 L O2 via [place], lungs clear to auscultation bilaterally.  Abdominal: soft, non-tender, non-distended.   Neurological: alert & oriented, no focal deficits noted.  MSK: no gross abnormalities, moving all limbs spontaneously.  Skin: warm and dry. Psych: normal mood and affect.  Pertinent Labs, Studies, and Procedures:     Latest Ref Rng & Units 08/25/2023    5:15 AM 08/24/2023    3:54 AM 08/23/2023    6:04 AM  CBC  WBC 4.0 - 10.5 K/uL 6.8  8.6  8.1   Hemoglobin 13.0 - 17.0 g/dL 16.0  10.9  32.3   Hematocrit 39.0 - 52.0 % 48.5  49.1  51.1   Platelets 150 - 400 K/uL 250  284  271        Latest Ref Rng & Units 08/25/2023    5:15 AM 08/24/2023    3:54 AM 08/23/2023  8:11 AM  CMP  Glucose 70 - 99 mg/dL 478  295  621   BUN 6 - 20 mg/dL 29  33  25   Creatinine 0.61 - 1.24 mg/dL 3.08  6.57  8.46   Sodium 135 - 145 mmol/L 139  136  138   Potassium 3.5 - 5.1 mmol/L 3.6  3.2  3.5   Chloride 98 - 111 mmol/L 96  90  93   CO2 22 - 32 mmol/L 32  33  29   Calcium 8.9 - 10.3 mg/dL 9.1  9.2  9.8     ECHOCARDIOGRAM COMPLETE Result Date: 08/23/2023    ECHOCARDIOGRAM REPORT   Patient Name:   Rica Mote Date of Exam: 08/23/2023 Medical Rec #:  [id number]                      Height:       68.0 in Accession #:    [id number]                     Weight:       259.7 lb Date of Birth:  05-01-1981                      BSA:          2.284 m Patient Age:    42 years                       BP:           149/129 mmHg Patient Gender: M                              HR:           86 bpm. Exam Location:  Inpatient Procedure: 2D Echo, 3D Echo, Cardiac Doppler, Color Doppler and Intracardiac            Opacification Agent Indications:    CHF I50.9  History:        Patient has prior history of Echocardiogram examinations, most                 recent 02/12/2023. CHF and Cardiomyopathy, CKD2; Polysubstance and                 ETOH abuse, Arrythmias:Atrial Fibrillation; Risk                 Factors:Non-Smoker, Dyslipidemia, Diabetes and Hypertension.  Sonographer:    Dondra Prader RVT RCS Referring Phys:  [id number] JULIE MACHEN IMPRESSIONS  1. Left ventricular ejection fraction, by estimation, is 20 to 25%. The left ventricle has severely decreased function. The left ventricle demonstrates global hypokinesis. The left ventricular internal cavity size was moderately dilated. There is moderate concentric left ventricular hypertrophy. Indeterminate diastolic filling due to E-A fusion.  2. Right ventricular systolic function is mildly reduced. The right ventricular size is mildly enlarged. Tricuspid regurgitation signal is inadequate for assessing PA pressure.  3. Left atrial size was moderately dilated.  4. Right atrial size was mildly dilated.  5. A small pericardial effusion is present. The pericardial effusion is circumferential.  6. The mitral valve is normal in structure. Mild mitral valve regurgitation. No evidence of mitral stenosis.  7. The aortic valve is normal in structure. Aortic valve regurgitation is not visualized. No aortic stenosis is present.  8. The inferior vena  cava is normal in size with greater than 50% respiratory variability, suggesting right atrial pressure of 3 mmHg.  9. Cannot exclude a small PFO. Comparison(s): No significant change from prior study. 02/2023-EF 20-25%. Conclusion(s)/Recommendation(s): No left ventricular mural or apical thrombus/thrombi. FINDINGS  Left Ventricle: Left ventricular ejection fraction, by estimation, is 20 to 25%. The left ventricle has severely decreased function. The left ventricle demonstrates global hypokinesis. Definity contrast agent was given IV to delineate the left ventricular endocardial borders. The left ventricular internal cavity size was moderately dilated. There is moderate concentric left ventricular hypertrophy. Indeterminate diastolic filling due to E-A fusion. Right Ventricle: The right ventricular size is mildly enlarged. Right vetricular wall thickness was not well visualized. Right ventricular systolic function is mildly reduced. Tricuspid  regurgitation signal is inadequate for assessing PA pressure. Left Atrium: Left atrial size was moderately dilated. Right Atrium: Right atrial size was mildly dilated. Pericardium: A small pericardial effusion is present. The pericardial effusion is circumferential. Mitral Valve: The mitral valve is normal in structure. Mild mitral valve regurgitation. No evidence of mitral valve stenosis. Tricuspid Valve: The tricuspid valve is grossly normal. Tricuspid valve regurgitation is trivial. No evidence of tricuspid stenosis. Aortic Valve: The aortic valve is normal in structure. Aortic valve regurgitation is not visualized. No aortic stenosis is present. Aortic valve mean gradient measures 2.7 mmHg. Aortic valve peak gradient measures 4.5 mmHg. Aortic valve area, by VTI measures 2.88 cm. Pulmonic Valve: The pulmonic valve was not well visualized. Pulmonic valve regurgitation is not visualized. No evidence of pulmonic stenosis. Aorta: The aortic root, ascending aorta, aortic arch and descending aorta are all structurally normal, with no evidence of dilitation or obstruction. Venous: The inferior vena cava is normal in size with greater than 50% respiratory variability, suggesting right atrial pressure of 3 mmHg. IAS/Shunts: Cannot exclude a small PFO.  LEFT VENTRICLE PLAX 2D LVIDd:         6.00 cm LVIDs:         5.30 cm LV PW:         1.50 cm LV IVS:        1.40 cm LVOT diam:     2.30 cm LV SV:         53 LV SV Index:   23 LVOT Area:     4.15 cm  LV Volumes (MOD) LV vol d, MOD A2C: 168.0 ml LV vol d, MOD A4C: 162.0 ml LV vol s, MOD A2C: 143.0 ml LV vol s, MOD A4C: 111.0 ml LV SV MOD A2C:     25.0 ml LV SV MOD A4C:     162.0 ml LV SV MOD BP:      40.7 ml RIGHT VENTRICLE            IVC RV Basal diam:  4.30 cm    IVC diam: 1.30 cm RV S prime:     9.18 cm/s TAPSE (M-mode): 1.9 cm LEFT ATRIUM              Index        RIGHT ATRIUM           Index LA diam:        5.10 cm  2.23 cm/m   RA Area:     17.10 cm LA Vol (A2C):    105.0 ml 45.98 ml/m  RA Volume:   49.90 ml  21.85 ml/m LA Vol (A4C):   94.6 ml  41.42 ml/m LA Biplane Vol: 104.0 ml 45.54 ml/m  AORTIC VALVE                    PULMONIC VALVE AV Area (Vmax):    3.16 cm     PV Vmax:       0.92 m/s AV Area (Vmean):   2.89 cm     PV Peak grad:  3.4 mmHg AV Area (VTI):     2.88 cm AV Vmax:           106.40 cm/s AV Vmean:          70.600 cm/s AV VTI:            0.186 m AV Peak Grad:      4.5 mmHg AV Mean Grad:      2.7 mmHg LVOT Vmax:         80.97 cm/s LVOT Vmean:        49.175 cm/s LVOT VTI:          0.129 m LVOT/AV VTI ratio: 0.69  AORTA Ao Root diam: 3.10 cm Ao Asc diam:  3.60 cm MITRAL VALVE MV Area (PHT): 4.73 cm    SHUNTS MV Decel Time: 160 msec    Systemic VTI:  0.13 m MV E velocity: 74.53 cm/s  Systemic Diam: 2.30 cm MV A velocity: 62.93 cm/s MV E/A ratio:  1.18 Jodelle Red MD Electronically signed by Jodelle Red MD Signature Date/Time: 08/23/2023/12:06:46 PM    Final    [place] EKG SITE RITE Result Date: 08/22/2023 If Site Rite image not attached, placement could not be confirmed due to current cardiac rhythm.  DG Chest Port 1 View Result Date: 08/22/2023 CLINICAL DATA:  Dyspnea. EXAM: PORTABLE CHEST 1 VIEW COMPARISON:  03/10/2023. FINDINGS: There are increased interstitial markings with bilateral hilar and bibasilar predominance. Findings favor moderate congestive heart failure/pulmonary edema. There are probable bilateral small layering pleural effusions. There are probable atelectatic changes at the lung bases. No pneumothorax. Stable mildly enlarged cardio-mediastinal silhouette. No acute osseous abnormalities. The soft tissues are within normal limits. IMPRESSION: *Findings favor congestive heart failure/pulmonary edema. Electronically Signed   By: Jules Schick M.D.   On: 08/22/2023 09:51     Discharge Instructions: Discharge Instructions     AMB referral to CHF clinic   Complete by: As directed    Reason for referral: Diastolic HF  Comment - Combined diastolic, systolic HF   AMB referral to cardiac rehabilitation - Phase II   Complete by: As directed    Diagnosis: Heart Failure (see criteria below if ordering Phase II)   Heart Failure Type: Chronic Systolic & Diastolic   After initial evaluation and assessments completed: Virtual Based Care may be provided alone or in conjunction with Phase 2 Cardiac Rehab based on patient barriers.: Yes   Intensive Cardiac Rehabilitation (ICR) [facility] location only OR Traditional Cardiac Rehabilitation (TCR) *If criteria for ICR are not met will enroll in TCR [facility] only): Yes   Amb Referral to Cardiac Rehabilitation   Complete by: As directed    Diagnosis: Heart Failure (see criteria below if ordering Phase II)   Heart Failure Type: Chronic Systolic & Diastolic   After initial evaluation and assessments completed: Virtual Based Care may be provided alone or in conjunction with Phase 2 Cardiac Rehab based on patient barriers.: Yes   Intensive Cardiac Rehabilitation (ICR) [facility] location only OR Traditional Cardiac Rehabilitation (TCR) *If criteria for ICR are not met will enroll in TCR [facility] only): Yes   Avoid straining   Complete by:  As directed    Call MD for:   Complete by: As directed    Call MD for:  difficulty breathing, headache or visual disturbances   Complete by: As directed    Call MD for:  extreme fatigue   Complete by: As directed    Call MD for:  hives   Complete by: As directed    Call MD for:  persistant dizziness or light-headedness   Complete by: As directed    Call MD for:  persistant nausea and vomiting   Complete by: As directed    Call MD for:  redness, tenderness, or signs of infection (pain, swelling, redness, odor or green/yellow discharge around incision site)   Complete by: As directed    Call MD for:  severe uncontrolled pain   Complete by: As directed    Call MD for:  temperature >100.4   Complete by: As directed    Diet - low sodium heart healthy   Complete  by: As directed    Discharge instructions   Complete by: As directed    Patient Instructions:  1. You were seen for a heart failure exacerbation. It is very important that you take your medications, as stopping your medications and not following up with a doctor outside of the hospital can put you at risk of having another heart failure exacerbation.   It is very important that you stop any substance use, including cocaine as this can also cause damage to your heart and health.   You required up to 3 liters of oxygen walking around with the physical therapist. Please make sure your can measure your oxygen and at home and use your oxygen when you are short of breath.   This is a list of the medications you are going home with and what they are helping to treat:   Heart failure, hypertension: - Torsemide 20 mg daily  - Losartan 50 mg daily  - Spironolactone 25 mg daily - Jardiance 10 mg daily - Digoxin 0.125 mg daily - Potassium 20 mEq (one tablet) daily   Atrial fibrillation:  - Xarelto 20 mg daily    Type 2 Diabetes: - Jardiance 10 mg    Hyperlipidemia: - Rosuvastatin 10 mg     2. You will get a call to set up an appointment with the Heart Failure clinic, where a cardiologist can help to determine what medications you need to continue taking.   3. We are happy to be your new primary care provider. Our clinic, the [facility] at [facility], is located on the ground floor (floor G). The phone number is [phone]. Please call us Monday to Friday 8 AM - 5 PM (Fridays we close at 12 PM) to make an appointment as soon as you come back from your work trip. You can tell them "Spanish" when you call and they will use an interpreter to speak with you.   4. Your weight today is 113.5 kilograms or 250 pounds. If you feel like you may be gaining weight with fluid, please be sure to call our clinic at [phone] so that we may see you faster.   5. Please call 911  or go to your nearest emergency department if you develop chest pain, sudden shortness of breath (and your oxygen is not helping), or any weakness/changes in speech/strong headache that doesn't go away as you may be having a medical emergency.   We are glad you are feeling better. It  was a pleasure serving you during your stay. - Dr. Justin Mend and the Internal Medicine Teaching Service at [facility]     Instrucciones para el paciente: 1. Lo admitimos al hospital por una exacerbacin de insuficiencia cardaca. Es [employer] importante que tome sus medicamentos, ya que dejar de tomarlos y no Science writer con un mdico fuera del hospital puede ponerlo en riesgo de sufrir otra exacerbacin de insuficiencia cardaca. Es muy importante que deje de consumir droga, incluyendo la cocana, ya que puede daar su corazn y Special educational needs teacher. Necesitaba hasta 3 litros de oxgeno mientras caminaba con el fisioterapeuta. Asegrese de poder medir su nivel de oxgeno en casa y de utilizarlo cuando tenga dificultad para respirar.  Esta es una lista de los medicamentos que usted debe de seguir tomando en casa y la razon por la cual los esta tomando: Insuficiencia cardaca, hipertensin: - Torsemida 20 mg al da - Losartn 50 mg al da - Espironolactona 25 mg al da - Jardiance 10 mg al da - Digoxina 0,125 mg al da - Potasio 20 mEq (una tableta) al da Fibrilacin auricular: - Xarelto 20 mg al da Diabetes tipo 2: - Jardiance 10 mg al da Hiperlipidemia: - Rosuvastatina 10 mg al da 2. Recibir una llamada para programar una cita en la clnica de insuficiencia cardaca, donde un cardilogo puede ayudarle a determinar qu medicamentos debe seguir tomando. 3. Nos complace ser su nuevo doctor. Lawana Chambers, el [place] de [facility], est [place] en la planta baja (piso G). El nmero de telfono es [phone]. Por favor llmenos de lunes a viernes de 8:00 a. m. a 5:00 p. m. (los viernes  cerramos a las 12:00 p. m.) para hacer una cita tan pronto como regrese de su viaje de [place]. Puede decirles "espaol" cuando llame y ellos utilizarn un intrprete para hablar con usted. 4. Su peso hoy es de 113,5 kilogramos o 250 libras. Si siente que puede estar aumentando de peso con lquido, asegrese de llamar a nuestra clnica al [phone] para que podamos atenderlo ms rpido. 5. Por favor llame al 911 o vaya al departamento de emergencias ms cercano si presenta dolor en el pecho, falta de aliento repentina (y su oxgeno no est ayudando), o cualquier debilidad/cambios en el habla/fuerte dolor de cabeza que no desaparece, ya que puede estar teniendo una emergencia mdica. Nos alegra que se sienta mejor. Fue un placer atenderlo durante su estada. - Dr. Justin Mend y el [facility].   Increase activity slowly   Complete by: As directed    STOP any activity that causes chest pain, shortness of breath, dizziness, sweating, or exessive weakness   Complete by: As directed       Signed: Azan Maneri Colbert Coyer, MD [facility] - PGY1 Pager: [phone] 08/25/2023, 6:36 PM    Please contact the on call pager after 5 pm and on weekends at [phone].

## 2023-08-25 NOTE — Plan of Care (Signed)

## 2023-08-25 NOTE — TOC Progression Note (Signed)
Transition of Care [facility]) - Progression Note    Patient Details  Name: Quillian Varble MRN: [id number] Date of Birth: September 15, 1980  [facility] CM/SW Contact  Elliot Cousin, RN Phone Number: 08/25/2023, 7:30 AM  Clinical Narrative:     CM spoke to pt at bedside with Spanish interpreter.  Provided pt with bus passes to use to get to his appts.  Pt has appt arranged with Internal Medicine for PCP follow up.  HF CSW will arrange transportation to his appt HF appt.  Pt states friend will provide transportation home.  Pt states he can pick up a scale for daily weights. Pt was provided a scale at previous visit.  Will use HF funds/MATCH for medications.   Expected Discharge Plan: Home/Self Care Barriers to Discharge: No Barriers Identified  Expected Discharge Plan and Services   Discharge Planning Services: CM Consult   Living arrangements for the past 2 months: Boarding House                                       Social Determinants of Health (SDOH) Interventions SDOH Screenings   Food Insecurity: No Food Insecurity (08/22/2023)  Housing: Patient Declined (08/24/2023)  Transportation Needs: No Transportation Needs (08/22/2023)  Utilities: Not At Risk (08/22/2023)  Alcohol Screen: Medium Risk (02/20/2022)  Depression (PHQ2-9): Low Risk  (10/12/2022)  Financial Resource Strain: Medium Risk (06/05/2022)  Tobacco Use: Low Risk  (08/22/2023)    Readmission Risk Interventions     No data to display

## 2023-08-25 NOTE — Progress Notes (Signed)
SATURATION QUALIFICATIONS: (This note is used to comply with regulatory documentation for home oxygen)  Patient Saturations on Room Air at Rest = 94%  Patient Saturations on Room Air while Ambulating = 84%  Patient Saturations on 3 Liters of oxygen while Ambulating = 91%  Please briefly explain why patient needs home oxygen: pt requires O2 in order to ambulate without desaturation.   Harrel Carina, DPT, CLT  Acute Rehabilitation Services Office: [phone] (Secure chat preferred)

## 2023-08-25 NOTE — Evaluation (Signed)
Occupational Therapy Evaluation Patient Details Name: Justin [name] MRN: [id number] DOB: Jul 12, 1981 Today's Date: 08/25/2023   History of Present Illness Patient is a 42 yo male presenting to the ED on 06/02/22 with SOB and cough. Patient admitted with CHGF exacerbation and elevated tropinin. PMH includes: hypertension, hyperlipidemia, nonischemic cardiomyopathy, recent ECHO with 35% EF, DM II, mitral regurgitation, ETOH abuse, hx "crack"/cocaine use.   Clinical Impression   Pt reports ind at baseline with ADLs/working and driving PTA, lives with roommates but has family assist PRN. Pt currently needing set up - min A for ADLs, ind with bed mobility, and  supervision for transfers without AD. SpO2 reading in 90's with good wave pleth. Would benefit from energy conservation education in future sessions. Pt presenting with impairments listed below, will follow acutely. Anticipate no OT follow up needs at d/c.       If plan is discharge home, recommend the following: A little help with bathing/dressing/bathroom;Assistance with cooking/housework    Functional Status Assessment  Patient has had a recent decline in their functional status and demonstrates the ability to make significant improvements in function in a reasonable and predictable amount of time.  Equipment Recommendations  None recommended by OT    Recommendations for Other Services PT consult     Precautions / Restrictions Precautions Precautions: None Restrictions Weight Bearing Restrictions Per Provider Order: No      Mobility Bed Mobility Overal bed mobility: Independent                  Transfers Overall transfer level: Needs assistance Equipment used: None Transfers: Sit to/from Stand Sit to Stand: Supervision                  Balance Overall balance assessment: No apparent balance deficits (not formally assessed)                                         ADL  either performed or assessed with clinical judgement   ADL Overall ADL's : Needs assistance/impaired Eating/Feeding: Set up   Grooming: Set up   Upper Body Bathing: Minimal assistance   Lower Body Bathing: Minimal assistance   Upper Body Dressing : Contact guard assist   Lower Body Dressing: Contact guard assist   Toilet Transfer: Contact guard assist   Toileting- Clothing Manipulation and Hygiene: Contact guard assist       Functional mobility during ADLs: Supervision/safety       Vision   Vision Assessment?: No apparent visual deficits     Perception Perception: Not tested       Praxis Praxis: Not tested       Pertinent Vitals/Pain Pain Assessment Pain Assessment: No/denies pain     Extremity/Trunk Assessment Upper Extremity Assessment Upper Extremity Assessment: Overall WFL for tasks assessed   Lower Extremity Assessment Lower Extremity Assessment: Defer to PT evaluation   Cervical / Trunk Assessment Cervical / Trunk Assessment: Normal   Communication Communication Communication: Other (comment) (virtual interpreter Kelby Fam [phone] utilized)   Cognition Arousal: Alert Behavior During Therapy: WFL for tasks assessed/performed Overall Cognitive Status: Within Functional Limits for tasks assessed                                       General Comments  SpO2 in 90's when  reading well (good pleth) on 2L    Exercises     Shoulder Instructions      Home Living Family/patient expects to be discharged to:: Private residence Living Arrangements: Other relatives (lives with 4 friends, mom/dad and siblings leave nearby) Available Help at Discharge: Family;Available PRN/intermittently Type of Home: House Home Access: Stairs to enter [employer] of Steps: 5 Entrance Stairs-Rails: None Home Layout: One level     Bathroom Shower/Tub: Chief Strategy Officer: Standard     Home Equipment: None   Additional  Comments: reports wearing oxygen at home; as needed per pt      Prior Functioning/Environment Prior Level of Function : Independent/Modified Independent;Driving;Working/employed             Mobility Comments: Works a Education administrator ADLs Comments: Ind with ADL's and IADL's.        OT Problem List: Decreased strength;Decreased range of motion;Decreased activity tolerance;Cardiopulmonary status limiting activity      OT Treatment/Interventions: Self-care/ADL training;Therapeutic exercise;Energy conservation;DME and/or AE instruction;Therapeutic activities;Patient/family education;Balance training    OT Goals(Current goals can be found in the care plan section) Acute Rehab OT Goals Patient Stated Goal: none stated OT Goal Formulation: With patient Time For Goal Achievement: 09/08/23 Potential to Achieve Goals: Good ADL Goals Pt Will Perform Upper Body Dressing: Independently;sitting Pt Will Perform Lower Body Dressing: Independently;sit to/from stand Pt Will Transfer to Toilet: ambulating;regular height toilet Additional ADL Goal #1: pt will verbalize x3 energy conservation strategies in prep for ADLs  OT Frequency: Min 1X/week    Co-evaluation              AM-PAC OT "6 Clicks" Daily Activity     Outcome Measure Help from another person eating meals?: None Help from another person taking care of personal grooming?: A Little Help from another person toileting, which includes using toliet, bedpan, or urinal?: A Little Help from another person bathing (including washing, rinsing, drying)?: A Little Help from another person to put on and taking off regular upper body clothing?: A Little Help from another person to put on and taking off regular lower body clothing?: A Little 6 Click Score: 19   End of Session Equipment Utilized During Treatment: Oxygen Nurse Communication: Mobility status  Activity Tolerance: Patient tolerated treatment well Patient left: in bed;with call  bell/phone within reach  OT Visit Diagnosis: Unsteadiness on feet (R26.81);Other abnormalities of gait and mobility (R26.89);Muscle weakness (generalized) (M62.81)                Time: 8657-8469 OT Time Calculation (min): 17 min Charges:  OT General Charges $OT Visit: 1 Visit OT Evaluation $OT Eval Low Complexity: 1 Low  Ina Scrivens K, OTD, OTR/L SecureChat Preferred Acute Rehab [phone]   Mart Colpitts K Koonce 08/25/2023, 11:10 AM

## 2023-08-27 ENCOUNTER — Telehealth (HOSPITAL_COMMUNITY): Payer: Self-pay | Admitting: Licensed Clinical Social Worker

## 2023-08-27 NOTE — Telephone Encounter (Signed)
H&V Care Navigation CSW Progress Note  Clinical Social Worker consulted to assist with transportation.  CSW called pt with interpreter to inquire about transportation for appt Thursday.  Pt confirmed he needed help with transportation.  Got updated address for chart.  [employer] taxi will bring pt to appt on Thursday- pick up at 1:45pm   SDOH Screenings   Food Insecurity: No Food Insecurity (08/22/2023)  Housing: Patient Declined (08/24/2023)  Transportation Needs: No Transportation Needs (08/22/2023)  Utilities: Not At Risk (08/22/2023)  Alcohol Screen: Medium Risk (02/20/2022)  Depression (PHQ2-9): Low Risk  (10/12/2022)  Financial Resource Strain: Medium Risk (06/05/2022)  Tobacco Use: Low Risk  (08/22/2023)   Burna Sis, LCSW Clinical Social Worker [facility] Desk#: [phone] Cell#: [phone]   08/27/2023  Nelson Frederique Solorio DOB: 01-30-1981 MRN: [id number]   RENUNCIA Y EXENCIN DE RESPONSABILIDAD DEL PILOTO  Con el fin de ayudar con las necesidades de transporte, [employer] se asocia con proveedores de transporte externos (compaas de taxi, Pharmacist, community, Catering manager.) para prestarles a los pacientes de [employer] u otras personas aprobadas la opcin de viajes cuando lo necesiten ("Servicios de transporte") a nuestros edificios  para visitas que no sean de Associate Professor.  [employer] de [place], yo, la persona que firma este documento, en mi nombre o en el de Scientist, water quality legal (a mi cargo que use los Servicios  detransporte), acepto que:  Los Servicios de transporte que se me prestan los ofrecen proveedores de transporte externos e independientes que no trabajan ni tienen ninguna asociacin con [employer]. [place] no es una empresa de transporte. [place] no tiene ningn control sobre la calidad o la seguridad de los viajes que haga usando los Servicios de transporte. [place] no tiene control sobre si los viajes externos se  harn a tiempo o no. [place] no garantiza la confiabilidad, calidad, seguridad o disponibilidad de ningn viaje, ni que no haya errores. S y acepto que viajar en vehculo (auto, camioneta, SUV, [place], autobs, taxi, etc.) implica riesgos de lesiones graves como discapacidad, parlisis y [place]. S y acepto que el riesgo de usar los Servicios de transporte es slo mo y no de [employer]. Los Servicios de transporte se prestan "tal cual" y segn la disponibilidad. Los proveedores de transporte se International aid/development worker de todas las revisiones y el cuidado de los vehculos usados para Radio producer estos viajes. Acepto no tomar acciones legales contra [place], sus agentes, empleados, funcionarios, directores, representantes, aseguradores, abogados, cesionarios, sucesores, subsidiarios ni asociados en cualquier momento por cualquier motivo relacionado directa o indirectamente con el uso de los [place] de transporte. [place] acepto no tomar acciones legales contra [employer] o sus asociados por alguna lesin, muerte o dao a la propiedad ocasionado por o relacionado con el uso de los Servicios de transporte.  He ledo esta Renuncia y exencin de responsabilidad y comprendo los trminos aqu usados y su significado legal. Esta Renuncia se otorga libre y voluntariamente entendiendo que renuncio a sabiendas a mi Associate Professor (o el de Scientist, research (medical)) de [employer] en relacin con los Servicios de transporte y el uso de estos servicios.  Doy fe de que lei la Exencion de viaje y exencion de responsabilidad a Luie Leang Villalobos Solorio, [place] a Mr. [name] la oportunidad de Technical sales engineer preguntas y respondi las preguntas formuladas (si as hubo).  Afirmo que [employer] Villalobos Solorio a ensuite donne son consentement pur obtenir de l'adie [employer]  transport.  Burna Sis

## 2023-08-28 ENCOUNTER — Telehealth: Payer: Self-pay

## 2023-08-28 NOTE — Transitions of Care (Post Inpatient/ED Visit) (Signed)
08/28/2023  Name: Justin [name] MRN: [id number] DOB: 1980-11-24  Today's TOC FU Call Status: Today's TOC FU Call Status:: Successful TOC FU Call Completed via [employer] [facility] FU Call Complete Date: 08/28/23 Patient's Name and Date of Birth confirmed.  Transition Care Management Follow-up Telephone Call Date of Discharge: 08/25/23 Discharge Facility[facility]) Type of Discharge: Inpatient Admission Primary Inpatient Discharge Diagnosis:: CHF How have you been since you were released from the hospital?: Better ("I am doing fine" Denies shortness of breath or edema")  Items Reviewed: Did you receive and understand the discharge instructions provided?: Yes Medications obtained,verified, and reconciled?: Yes (Medications Reviewed) Any new allergies since your discharge?: No Dietary orders reviewed?: Yes Type of Diet Ordered:: Heart Healthy Do you have support at home?: Yes People in Home: friend(s) Name of Support/Comfort Primary Source: Live with friends  Medications Reviewed Today: Medications Reviewed Today     Reviewed by Wyline Mood, RN (Case Manager) on 08/28/23 at 1034  Med List Status: <None>   Medication Order Taking? Sig Documenting Provider Last Dose Status Informant  digoxin (LANOXIN) 0.125 MG tablet [id number] Yes Take 1 tablet (0.125 mg total) by mouth daily. Philomena Doheny, MD Taking Active   empagliflozin (JARDIANCE) 10 MG TABS tablet [id number] Yes Take 1 tablet (10 mg total) by mouth daily. Philomena Doheny, MD Taking Active   losartan (COZAAR) 50 MG tablet [id number] Yes Take 1 tablet (50 mg total) by mouth daily. Philomena Doheny, MD Taking Active   potassium chloride SA (KLOR-CON M) 20 MEQ tablet [id number] Yes Take 1 tablet (20 mEq total) by mouth daily. Philomena Doheny, MD Taking Active   rivaroxaban (XARELTO) 20 MG TABS tablet [id number] Yes Take 1 tablet (20 mg total) by mouth  daily with supper. Philomena Doheny, MD Taking Active   rosuvastatin (CRESTOR) 10 MG tablet [id number] Yes Take 1 tablet (10 mg total) by mouth daily. Philomena Doheny, MD Taking Active   spironolactone (ALDACTONE) 25 MG tablet [id number] Yes Take 1 tablet (25 mg total) by mouth daily. Philomena Doheny, MD Taking Active   torsemide (DEMADEX) 20 MG tablet [id number] Yes Take 1 tablet (20 mg total) by mouth daily. Philomena Doheny, MD Taking Active             Home Care and Equipment/Supplies: Were Home Health Services Ordered?: NA Any new equipment or medical supplies ordered?: NA  Functional Questionnaire: Do you need assistance with bathing/showering or dressing?: No Do you need assistance with meal preparation?: No Do you need assistance with eating?: No Do you have difficulty maintaining continence: No Do you need assistance with getting out of bed/getting out of a chair/moving?: No Do you have difficulty managing or taking your medications?: No  Follow up appointments reviewed: PCP Follow-up appointment confirmed?: Yes Date of PCP follow-up appointment?: 08/30/23 Follow-up Provider: Dr. [name] Specialist [facility] Follow-up appointment confirmed?: Yes Date of Specialist follow-up appointment?: 08/30/23 Follow-Up Specialty Provider:: Dr. Elmon Else Do you need transportation to your follow-up appointment?: No Do you understand care options if your condition(s) worsen?: Yes-patient verbalized understanding  SDOH Interventions Today    Flowsheet Row Most Recent Value  SDOH Interventions   Food Insecurity Interventions Intervention Not Indicated  Housing Interventions Intervention Not Indicated  Transportation Interventions Intervention Not Indicated  Utilities Interventions Intervention Not Indicated       Goals Addressed             This Visit's Progress  Transition of care       Current Barriers:  Medication  management  Diet/Nutrition/Food Resources  Provider appointments  Knowledge Deficits related to plan of care for management of CHF  Language Barrier - Spanish speaking  RNCM Clinical Goal(s):  Patient will work with the Care Management team over the next 30 days to address Transition of Care Barriers: Medication Management Diet/Nutrition/Food Resources Provider appointments verbalize basic understanding of  CHF disease process and self health management plan as evidenced by patient able to verbalize signs and symptoms of heart failure exacerbation and home self-management to keep disease controlled. take all medications exactly as prescribed and will call provider for medication related questions as evidenced by verbalized 100% compliance with taking medications as prescribed. attend all scheduled medical appointments: Primary Care, Specialist as evidenced by review of EMR. not experience hospital re-admission within 30 days as evidenced by review of EMR through collaboration with RN Care manager, provider, and care team.   Interventions: Evaluation of current treatment plan related to  self management and patient's adherence to plan as established by provider. Utilized [employer] for telephone visit communication, phone number [phone].  Code for this call [phone].  Heart Failure Interventions:  (Status:  New goal.) Short Term Goal Basic overview and discussion of pathophysiology of Heart Failure reviewed Assessed need for readable accurate scales in home Provided education about placing scale on hard, flat surface Advised patient to weigh each morning after emptying bladder Discussed importance of daily weight and advised patient to weigh and record daily Assessed social determinant of health barriers   Patient Goals/Self-Care Activities: Participate in Transition of Care Program/Attend [facility] scheduled calls Notify RN Care Manager of TOC call rescheduling  needs Take all medications as prescribed Attend all scheduled provider appointments Call provider office for new concerns or questions  call office if I gain 2 pounds in one night or 3-4 pounds in 1-2 days. weigh myself daily and keep a log of daily weights know when to call the doctor: shortness of breath, increased swelling in legs and ankles, weight gain more than 2 lbs overnight or 5 pounds in 1 week.  Follow Up Plan:  Telephone follow up appointment with care management team member scheduled for:  09/07/23 at 9:15 AM. The patient has been provided with contact information for the care management team and has been advised to call with any health related questions or concerns.  Next PCP appointment scheduled for: 08/31/2023 Dr. [name] Cardiology appointment scheduled for:  08/30/2023 Dr. Marca Ancona        Wyline Mood BSN, RN RN Care Manager   Transitions of Care [facility] Direct Dial Number:  [phone]

## 2023-08-30 ENCOUNTER — Encounter (HOSPITAL_COMMUNITY): Payer: Self-pay | Admitting: Cardiology

## 2023-08-30 ENCOUNTER — Telehealth (HOSPITAL_COMMUNITY): Payer: Self-pay | Admitting: Licensed Clinical Social Worker

## 2023-08-30 NOTE — Telephone Encounter (Signed)
H&V Care Navigation CSW Progress Note  Clinical Social Worker texted patient to remind of appointment today and ride details.   SDOH Screenings   Food Insecurity: No Food Insecurity (08/28/2023)  Housing: Unknown (08/28/2023)  Transportation Needs: No Transportation Needs (08/28/2023)  Recent Concern: Transportation Needs - Unmet Transportation Needs (08/27/2023)  Utilities: Not At Risk (08/28/2023)  Alcohol Screen: Medium Risk (02/20/2022)  Depression (PHQ2-9): Low Risk  (10/12/2022)  Financial Resource Strain: Medium Risk (06/05/2022)  Tobacco Use: Low Risk  (08/22/2023)    Burna Sis, LCSW Clinical Social Worker [facility] Desk#: [phone] Cell#: [phone]

## 2023-08-30 NOTE — Telephone Encounter (Signed)
Patient responded to text but message was unclear.  CSW called with interpreter and patient states he is working out of town so will not be able to make appt today.  CSW helped to connect to scheduler and is now booked for next available which was January 10th.  CSW will follow up with patient closer to date to confirm transportation needs.  Burna Sis, LCSW Clinical Social Worker [facility] Desk#: [phone] Cell#: [phone]

## 2023-08-31 ENCOUNTER — Encounter: Payer: Self-pay | Admitting: Student

## 2023-09-06 ENCOUNTER — Other Ambulatory Visit: Payer: Self-pay

## 2023-09-06 NOTE — Patient Outreach (Signed)
[name] Management  Transitions of [name] [name] Transitions of [name] Post-discharge week 2   09/06/2023 Name: Justin [name] MRN: [id number] DOB: 13-Mar-1981  Subjective: Justin [name] is a 42 y.o. year old adult who is a primary [name] patient of Justin [name]. The [name] Management team engaged with patient by telephone to assess and address transitions of [name] needs.   Consent to Services:  Patient was given information about [name] management services, agreed to services, and gave verbal consent to participate.   Assessment:   Patient reports he is doing well.  Denies any recent weight changes, swelling in extremities, chest pain or shortness of breath.  Patient reports that he checks his weight and blood sugar daily but was unable to provide any readings today.  Patient reports he has returned to work in [employer] up to 8 hours a day.  He reports his primary concern is he does not seem to have the same strength he had previously especially when lifting.  Patient was reminded of his discharge instructions of not to strain or over exert himself.  Pt reports 100% compliance with taking his medications as prescribed.  The appointments he had to see his PCP and cardiologist he rescheduled due to conflict with his work schedule.  Per MyChart his next PCP and Cardiology appointments are scheduled for 09/20/2023 and 09/21/2023 and he has already secured transportation to these appointments.  Patient would like to be referred again for cardiac rehabilitation if it may help with his strength and endurance.  Message left with his Cardiologist office for follow up on this referral. Patient denies any acute needs at this time.  Will continue to follow for the Justin [name].       SDOH Interventions    Flowsheet Row Patient Outreach from 09/06/2023 in Justin [name] Telephone from 08/28/2023 in  Justin [name] Telephone from  08/27/2023 in Justin [name] ED to Hosp-Admission (Discharged) from 06/02/2022 in Justin [name] ED to Hosp-Admission (Discharged) from 02/09/2022 in Justin [name]  SDOH Interventions       Food Insecurity Interventions -- Intervention Not Indicated -- -- Intervention Not Indicated  Housing Interventions -- Intervention Not Indicated -- -- Intervention Not Indicated  Transportation Interventions Other (Comment)  [Justin [name] has been assisting with transportation to medical appointments] Intervention Not Indicated Taxi Voucher Given -- Intervention Not Indicated  Utilities Interventions -- Intervention Not Indicated -- -- --  Financial Strain Interventions -- -- -- Artist Other (Comment)        Goals Addressed             This Visit's Progress    Transition of [name]       Current Barriers:  Medication management  Diet/Nutrition/Food Resources  Provider appointments  Knowledge Deficits related to plan of [name] for management of CHF  Language Barrier - Spanish speaking  RNCM Clinical Goal(s):  Patient will work with the [name] Management team over the next 30 days to address Transition of [name] Barriers: Medication Management Diet/Nutrition/Food Resources Provider appointments Verbalize basic understanding of  CHF disease process and self health management plan as evidenced by patient able to verbalize signs and symptoms of heart failure exacerbation, compliance with home medications and home self-management to keep disease controlled. Take all medications exactly as prescribed and will call provider for medication related questions as evidenced by verbalized 100% compliance with taking medications  as prescribed. Attend all scheduled medical appointments: Primary [name], Specialist as evidenced by review of EMR. Not experience hospital re-admission within 30 days as evidenced by review of EMR  through collaboration with RN [name] manager, provider, and [name] team.   Interventions: Evaluation of current treatment plan related to  self management and patient's adherence to plan as established by provider. Utilized [employer] for telephone visit communication, phone number [phone].  Code for this call [phone]. Outreach to Justin Heart Failure Clinical SW to discuss patient's renewed interest in cardiac rehabilitation, confirm if they assist with home scales and home blood pressure monitor and other services they presently assist the patient with. Assessed compliance with home oxygen orders for 1.5L via nasal cannula at resting and up to 3L with exertion.  Heart Failure Interventions:  (Status:  Goal on track:  Yes.) Short Term Goal RNCM to provide written patient instruction on maintaining a low sodium, heart healthy diet. Discussed importance of daily weight and advised patient to weigh and record daily.  Patient states he has scales. Reinforced the importance of keeping all doctor appointments and taking all your medicines. Per review of EMR,  Patient cancelled his 12/19 and 12/20 follow-up appointments to his PCP and Cardiologist  Educated that keeping his doctor's appointments are very important in managing his medical conditions and in decreasing likelihood of hospital readmissions.  Patient verbalized good understanding.Provided patient with education about the role of exercise in the management of heart failure and per his discharge instructions he should not strain and stop any activity that causes excessive weakness or exertion.  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  He had questions re: reason for his decreased strength and endurance.  Provided educ on how his heart is pumping at a lower level (20-25%), as well as, some the medications he takes for the Heart Failure and High Blood Pressure could all potentially  impact his decrease in strength and/or endurance since hospitalization. Reassess social determinant of health barriers - Medicaid application pending, Heart Failure [name] assist with medical transportation and cost of medications.  Awaiting call back from Clinical Social Worker to determine what other assistance they provide to prevent a duplication of services if an AMB Referral were to be initiated.  Patient Goals/Self-[name] Activities: Participate in Transition of [name] [name]/Attend [facility] scheduled calls Notify RN [name] Manager of TOC call rescheduling needs Take all medications as prescribed Attend all scheduled provider appointments.  PCP doctor appointment rescheduled for 09/20/23 and 09/21/23 with Cardiologist respectively.  Follow-up with your Cardiology office to let them know you are interested in being referred to cardiac rehabilitation again. Weigh myself daily and keep a log of daily weights;  call Cardiology office if I gain 2 pounds in one night or 3-4 pounds in 1-2 days. know when to call the doctor: shortness of breath, increased swelling in legs and ankles, weight gain more than 2 lbs overnight or 5 pounds in 1 week.  Follow Up Plan:  Telephone follow up appointment with [name] management team member scheduled for:  09/13/2023 at 11:00 AM. The patient has been provided with contact information for the [name] management team and has been advised to call with any health related questions or concerns.  Next PCP appointment scheduled for: 09/20/2023 Dr. [name] Cardiology appointment scheduled for:  09/21/23 Dr. Marca Ancona  Wyline Mood BSN, RN RN [name] Manager   Transitions of [name] VBCI - [facility] Direct  Dial Number:  [phone]         Plan: Telephone follow up appointment with [name] management team member scheduled for:  09/13/2023 at 11 AM The patient has been provided with contact information for the [name] management team and has been advised to call  with any health related questions or concerns.   Syanne Looney Daphine Deutscher BSN, RN RN [name] Manager   Transitions of [name] VBCI - [facility] Direct Dial Number:  [phone]

## 2023-09-06 NOTE — Patient Outreach (Signed)
Care Coordination   Provider Care Coordination  Visit Note   09/06/2023 Name: Justin [name] MRN: [id number] DOB: [birth year]  Justin [name] is a 42 y.o. year old adult who is followed at the [facility] for management of his Congestive Heart Failure.  After completing TOC program call today, RNCM left voice message for the Clinical Social Worker at the clinic, at [employer] [facility], regarding services they are providing (I.e. medical appointment transportation) and other SDOH interventions they provide as patient inquired about disability qualifications due to his Heart Failure.  Additionally, advised that the patient has requested to be referred again for cardiac rehabilitation due to what he reports as a decreased in his strength and endurance which is impacting his ability to complete his job function.  RNCM contact information provided requesting a return call back at their earliest convenience.  What matters to the patients health and wellness today?  See copy of RNCM TOC Note from today's telephone visit with patient below.    Goals Addressed             This Visit's Progress    Transition of care       Current Barriers:  Medication management  Diet/Nutrition/Food Resources  Provider appointments  Knowledge Deficits related to plan of care for management of CHF  Language Barrier - Spanish speaking  RNCM Clinical Goal(s):  Patient will work with the Care Management team over the next 30 days to address Transition of Care Barriers: Medication Management Diet/Nutrition/Food Resources Provider appointments Verbalize basic understanding of  CHF disease process and self health management plan as evidenced by patient able to verbalize signs and symptoms of heart failure exacerbation, compliance with home medications and home self-management to keep disease controlled. Take all medications exactly as prescribed and will call provider  for medication related questions as evidenced by verbalized 100% compliance with taking medications as prescribed. Attend all scheduled medical appointments: Primary Care, Specialist as evidenced by review of EMR. Not experience hospital re-admission within 30 days as evidenced by review of EMR through collaboration with RN Care manager, provider, and care team.   Interventions: Evaluation of current treatment plan related to  self management and patient's adherence to plan as established by provider. Utilized [employer] for telephone visit communication, phone number [phone].  Code for this call [phone]. Outreach to Advanced Heart Failure Clinical SW to discuss patient's renewed interest in cardiac rehabilitation, confirm if they assist with home scales and home blood pressure monitor and other services they presently assist the patient with. Assessed compliance with home oxygen orders for 1.5L via nasal cannula at resting and up to 3L with exertion.  Heart Failure Interventions:  (Status:  Goal on track:  Yes.) Short Term Goal RNCM to provide written patient instruction on maintaining a low sodium, heart healthy diet. Discussed importance of daily weight and advised patient to weigh and record daily.  Patient states he has scales. Reinforced the importance of keeping all doctor appointments and taking all your medicines. Per review of EMR,  Patient cancelled his 12/19 and 12/20 follow-up appointments to his PCP and Cardiologist  Educated that keeping his doctor's appointments are very important in managing his medical conditions and in decreasing likelihood of hospital readmissions.  Patient verbalized good understanding.Provided patient with education about the role of exercise in the management of heart failure and per his discharge instructions he should not strain and stop any activity that causes excessive weakness  or exertion.  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  He had questions re: reason for his decreased strength and endurance.  Provided educ on how his heart is pumping at a lower level (20-25%), as well as, some the medications he takes for the Heart Failure and High Blood Pressure could all potentially impact his decrease in strength and/or endurance since hospitalization. Reassess social determinant of health barriers - Medicaid application pending, Heart Failure Clinic assist with medical transportation and cost of medications.  Awaiting call back from Clinical Social Worker to determine what other assistance they provide to prevent a duplication of services if an AMB Referral were to be initiated.  Patient Goals/Self-Care Activities: Participate in Transition of Care Program/Attend [facility] scheduled calls Notify RN Care Manager of TOC call rescheduling needs Take all medications as prescribed Attend all scheduled provider appointments.  PCP doctor appointment rescheduled for 09/20/23 and 09/21/23 with Cardiologist respectively.  Follow-up with your Cardiology office to let them know you are interested in being referred to cardiac rehabilitation again. Weigh myself daily and keep a log of daily weights;  call Cardiology office if I gain 2 pounds in one night or 3-4 pounds in 1-2 days. know when to call the doctor: shortness of breath, increased swelling in legs and ankles, weight gain more than 2 lbs overnight or 5 pounds in 1 week.  Follow Up Plan:  Telephone follow up appointment with care management team member scheduled for:  09/13/2023 at 11:00 AM. The patient has been provided with contact information for the care management team and has been advised to call with any health related questions or concerns.  Next PCP appointment scheduled for: 09/20/2023 Dr. [name] Cardiology appointment scheduled for:  09/21/23 Dr. Marca Ancona  Wyline Mood BSN, RN RN Care Manager   Transitions of Care VBCI -  [facility] Direct Dial Number:  [phone]         SDOH assessments and interventions completed:  Yes  SDOH Interventions Today    Flowsheet Row Most Recent Value  SDOH Interventions   Transportation Interventions Other (Comment)  [Advanced [facility] Clinic has been assisting with transportation to medical appointments]  Medicaid application pending. Patient has questions regarding his eligibility for disability as he has attempted to return to work full-time in [employer] work but has found it difficulty to perform moore of the strenous activities his job requires.     Care Coordination Interventions:  Yes, provided   Follow up plan:  Outreach to Clinical Social Worker at [facility] for care coordination/collaboration completed.    Encounter Outcome:  Patient Visit Completed    Juandedios Dudash Daphine Deutscher BSN, RN RN Care Manager   Transitions of Care VBCI - [facility] Direct Dial Number:  [phone]

## 2023-09-07 ENCOUNTER — Telehealth: Payer: Self-pay

## 2023-09-07 NOTE — Patient Outreach (Signed)
  Care Coordination   Follow Up Visit Note   09/07/2023 Name: Justin [name] MRN: [id number] DOB: 18-Apr-1981  Justin [name] is a 42 y.o. year old adult who is scheduled to see Dr. Carmina [name] for primary care. I engaged with Justin [name], Clinical Social Worker from [facility] in the Cardiology office today.  Discussed patient contact from 09/06/2023 to confirm the services their social worker department provides the patient.  Justin [name] confirmed that they only are able to provide transportation to the Heart Failure Clinic and their main goal has been trying to get him in to see his Cardiologist which so far, despite arranging transportation, has been unsuccessful.  Confirmed patient is getting his medication from the [facility] pharmacy and they are trying to get him seen before his medications run out.  Justin [name] has rescheduled his PCP appointment for 09/20/2023 and with Cardiology for 09/21/2023.  I mentioned that the patient expressed interest in cardiac rehabilitation, which he initially did not follow-up on due to conflict with his working schedule.  Justin [name] expressed that she would encourage the patient to try and keep his upcoming appointment with the Cardiologist and discuss if this referral is still appropriate for him.  Will reassess for a possible referral to our social worker department to see if he would like their assistance with transportation, community resources and/or unmet needs.  What matters to the patients health and wellness today?  Maintain compliance/contact with doctor appointments; medications; treatment plans to prevent rehospitalization    SDOH assessments and interventions completed:  Yes, 12/26     Care Coordination Interventions:  Yes, provided  Collaboration with CSW at [facility]  Follow up plan: Follow up call scheduled for 09/13/2023 with Justin [name]    Encounter Outcome:  Contact completed   Justin [name] BSN,  RN RN Care Manager   Transitions of Care VBCI - [facility] Direct Dial Number:  [phone]

## 2023-09-13 ENCOUNTER — Other Ambulatory Visit: Payer: Self-pay

## 2023-09-13 NOTE — Patient Outreach (Signed)
 Care Management  Transitions of Care Program Transitions of Care Post-discharge week 3   09/13/2023 Name: Justin [name] MRN: [id number] DOB: February 14, 1981  Subjective: Justin [name] is a 43 y.o. year old adult who is a primary care patient of Dr.Ingold.. The Care Management team Engaged with patient by telephone to assess and address transitions of care needs.   Consent to Services:  Patient was given information about care management services, agreed to services, and gave verbal consent to participate.   Assessment:   [employer] Officer for telephone visit communication, phone number [phone].  Code for this call [phone].  Patient reports that is doing well except for ongoing decreased activity tolerance and endurance.  Patient denies any chest pain or pressure, shortness of breath, swelling, or difficulty laying down to sleep.  Patient reports that he has not used the oxygen since our last telephone contact.  RNCM reinforced the oxygen orders that he was discharged from the hospital on.  Advised patient to contact his Cardiologist or PCP about discontinuing the oxygen if he feels like he no longer needs it so that he can be appropriately advised from his doctor before ending any medications, treatments or therapies ordered by his doctor.  Patient stated that he would make them aware.  He reports that he continues to monitor his weight daily and that his weights have ranged from 260 (discharge weight) and 262 lbs.  Justin [name] states that his primary concern is his decreased endurance and activity tolerance.  States he is barely able to work a full 8 hour day.  RNCM reinforced prior teaching regarding how his decreased cardiac (heart) output correlates with decreased endurance and activity tolerance and he should try to factor in time for more frequent rest breaks throughout the day to take resting breaks and to avoid straining or over exerting  himself. Patient instructed to be sure to make his doctor aware at his next appointment.  Upcoming doctor appointments reviewed and confirmed patient has transportation to both appointments. Declined SW referral at his time.      SDOH Interventions    Flowsheet Row Patient Outreach from 09/13/2023 in [place] POPULATION HEALTH DEPARTMENT Patient Outreach from 09/06/2023 in  POPULATION HEALTH DEPARTMENT Telephone from 08/28/2023 in [place] POPULATION HEALTH DEPARTMENT Telephone from 08/27/2023 in [facility] ED to Hosp-Admission (Discharged) from 06/02/2022 in [facility] 3E HF PCU ED to Hosp-Admission (Discharged) from 02/09/2022 in [place] 6E Progressive Care  SDOH Interventions        Food Insecurity Interventions -- -- Intervention Not Indicated -- -- Intervention Not Indicated  Housing Interventions -- -- Intervention Not Indicated -- -- Intervention Not Indicated  Transportation Interventions Other (Comment)  [[facility] Clinic has made transportation arrangements for patient to get to his cardiology appts.  Patient denies needing assistance getting to his PCP appt.] Other (Comment)  [[facility] has been assisting with transportation to medical appointments] Intervention Not Indicated Taxi Voucher Given -- Intervention Not Indicated  Utilities Interventions -- -- Intervention Not Indicated -- -- --  Financial Strain Interventions -- -- -- -- Artist Other (Comment)        Goals Addressed             This Visit's Progress    Transition of care       Current Barriers:  Medication management  Provider appointments  Knowledge Deficits related to plan of  care for management of CHF  Language Barrier - Spanish speaking  RNCM Clinical Goal(s):  Patient will work with the Care Management team over the next 30 days to address Transition of Care Barriers: Medication Management Provider  appointments Verbalize basic understanding of  CHF disease process and self health management plan as evidenced by review of EMR, no hospital readmits in 30 days, and patient able to verbalize signs and symptoms of heart failure exacerbation, compliance with home medications, doctor appointments and home self-management to keep disease controlled. Take all medications exactly as prescribed and will call provider for medication related questions as evidenced by verbalized 100% compliance with taking medications as prescribed. Attend all scheduled medical appointments: Primary Care, Specialist as evidenced by review of EMR.  Interventions: Evaluation of current treatment plan related to  self management and patient's adherence to plan as established by provider. Utilized [employer] for telephone visit communication, phone number [phone].  Code for this call [phone]. Care coordination call with Jenna, SW at [facility] last week to discuss services they are providing to the patient.  Confirmed they are arranging his appointments to the Heart Failure Clinic only.   RNCM assessed with patient today if he needed transportation to attend to his PCP appointment.  Patient declined referral for SW referral to assistance with medical transportation to his other medical appointments.  Stated he would be able to get his appointments.  Heart Failure Interventions:  (Status:  Goal on track:  Yes.) Long Term Goal RNCM provided verbal patient instruction on maintaining a low sodium, heart healthy diet and selections to consider when making food choices. RNCM reviewed with patient his daily weight readings.  Patient reports his weights stay between 260 lbs to 262 lbs since he has been discharged.  Encouraged to continue with daily weights and keeping a log to share with Cardiologist at his upcoming follow-up appointment. Reviewed with patient his upcoming appointments - 09/20/23 with  Dr. Marylu and 09/21/23 at [facility](Cardiology).  Patient denies any barriers to attending his upcoming appointments and plans to attend. Reviewed/discussed his current medications and whether or not he still had enough medication to last until his appointments next week.  Patient confirmed that he felt like he had enough to last.  RNCM educated to call his doctor's office at least 2-3 business days before completely running out of medication should he find some of his medications need refilling  prior to his upcoming appointment.  Patient verbalized understanding. Reassess social determinant of health barriers - Medicaid application still pending.  Denies need for Social Worker referral to assistance with transportation to PCP appointment.  Transportation has been arranged through the Heart Failure clinic for his upcoming Cardiology appointment.  Patient Goals/Self-Care Activities: Participate in Transition of Care Program/Attend [facility] scheduled calls Notify RN Care Manager of TOC call rescheduling needs Take all medications as prescribed Attend all scheduled provider appointments.  PCP doctor appointment rescheduled for 09/20/23 and 09/21/23 with Cardiologist respectively.  Weigh myself daily and keep a log of daily weights;  call Cardiology office if I gain 2 pounds in one night or 3-4 pounds in 1-2 days. know when to call the doctor: shortness of breath, increased swelling in legs and ankles, weight gain more than 2 lbs overnight or 5 pounds in 1 week.  Follow Up Plan:  Telephone follow up appointment with care management team member scheduled for:  09/21/2023 at 4:15 PM. The patient has been provided with contact information  for the care management team and has been advised to call with any health related questions or concerns.  Next PCP appointment scheduled for: 09/20/2023 Dr. Norman Lobstein Cardiology appointment scheduled for:  09/21/23 Dr. [name]  [place]co Lunger BSN, RN RN Care  Manager   Transitions of Care VBCI - [facility] [place] Direct Dial Number:  [phone]         Plan: Telephone follow up appointment with care management team member scheduled for: 09/21/23 at 4:15 PM  Tamim Skog Lunger BURKITT, RN RN Care Manager   Transitions of Care VBCI - [facility] Direct Dial Number:  [phone]

## 2023-09-19 ENCOUNTER — Telehealth (HOSPITAL_COMMUNITY): Payer: Self-pay | Admitting: Licensed Clinical Social Worker

## 2023-09-19 ENCOUNTER — Other Ambulatory Visit: Payer: Self-pay | Admitting: *Deleted

## 2023-09-19 NOTE — Telephone Encounter (Signed)
 H&V Care Navigation CSW Progress Note  Clinical Social Worker messaged patient regarding 1/10 appt- pt confirms he is aware of appt and has arranged his own transportation to come.  No further needs identified at this time.   SDOH Screenings   Food Insecurity: No Food Insecurity (08/28/2023)  Housing: Unknown (08/28/2023)  Transportation Needs: Unmet Transportation Needs (09/06/2023)  Utilities: Not At Risk (08/28/2023)  Alcohol Screen: Medium Risk (02/20/2022)  Depression (PHQ2-9): Low Risk  (10/12/2022)  Financial Resource Strain: Medium Risk (06/05/2022)  Tobacco Use: Low Risk  (08/22/2023)   Andriette HILARIO Leech, LCSW Clinical Social Worker [facility] Desk#: [phone] Cell#: [phone]

## 2023-09-20 ENCOUNTER — Telehealth (HOSPITAL_COMMUNITY): Payer: Self-pay

## 2023-09-20 ENCOUNTER — Encounter: Payer: Self-pay | Admitting: Student

## 2023-09-20 NOTE — Telephone Encounter (Signed)
 Called to confirm/remind patient of their appointment at the [facility] on 09/21/23.   Patient reminded to bring in all medications and/or complete list.  Confirmed patient has transportation. Gave directions, instructed to utilize valet parking.  Confirmed appointment prior to ending call.

## 2023-09-21 ENCOUNTER — Other Ambulatory Visit: Payer: Self-pay

## 2023-09-21 ENCOUNTER — Ambulatory Visit (HOSPITAL_COMMUNITY)
Admission: RE | Admit: 2023-09-21 | Discharge: 2023-09-21 | Disposition: A | Discharge status: Home / Self Care | Payer: MEDICAID | Source: Ambulatory Visit | Attending: *Deleted | Admitting: *Deleted

## 2023-09-21 NOTE — Patient Outreach (Signed)
 Care Management  Transitions of Care Program Transitions of Care Post-discharge week 3   09/21/2023 Name: Justin [name] MRN: [id number] DOB: 1981/04/06  Subjective: Justin [name] Justin [name] is a 43 y.o. year old adult who is a primary care patient of [name], DO. The Care Management team Engaged with patient Engaged with patient by telephone to assess and address transitions of care needs.   Consent to Services:  Patient was given information about care management services, agreed to services, and gave verbal consent to participate.   Assessment:  Spoke with patient today via [employer] for telephone visit communication, phone number [phone].  Code for this call [phone].  Call was disconnected few times during the call and reconnection has to be established again.  Patient reports that was unable to attend his Cardiology appointment on 09/21/23 at [facility], despite having transportation pre-arranged, nor was he able to attend his PCP appointment with Dr. Marylu.  [name] cardiology appointment has been rescheduled but he still needs to reschedule his PCP appointment which he plans to do next week.  The patient reports taking all of his medications.  He denies shortness of breath, chest pain, swelling in his extremities, or difficulty laying down to sleep.  He reports his weight has been stable.  Encouraged patient to call him for his prescription refills as he states he only has close to 2-3 days of medication left.  AMB Referral submitted for Social Worker to assist with Medicaid/lack of medical coverage, ongoing access to medications, food resources to help with healthy eating and medical transportation.  Patient wonders if he would qualify for SSA Disability, unsure of documented status.       SDOH Interventions    Flowsheet Row Patient Outreach from 09/21/2023 in [place] POPULATION HEALTH DEPARTMENT  Patient Outreach from 09/13/2023 in [place] POPULATION HEALTH DEPARTMENT Patient Outreach from 09/06/2023 in [place] POPULATION HEALTH DEPARTMENT Telephone from 08/28/2023 in [place] POPULATION HEALTH DEPARTMENT Telephone from 08/27/2023 in [facility] ED to Hosp-Admission (Discharged) from 06/02/2022 in [facility] 3E HF PCU  SDOH Interventions        Food Insecurity Interventions -- -- -- Intervention Not Indicated -- --  Housing Interventions -- -- -- Intervention Not Indicated -- --  Transportation Interventions AMB Referral Other (Comment)  [Heart Failure Clinic has made transportation arrangements for patient to get to his cardiology appts.  Patient denies needing assistance getting to his PCP appt.] Other (Comment)  [[facility] has been assisting with transportation to medical appointments] Intervention Not Indicated Taxi Voucher Given --  Utilities Interventions -- -- -- Intervention Not Indicated -- --  Financial Strain Interventions -- -- -- -- -- Artist  Health Literacy Interventions AMB Referral -- -- -- -- --        Goals Addressed             This Visit's Progress    COMPLETED: Transition of care       Current Barriers:  Adherence Medication management  Adherence Provider appointments  Knowledge Deficits related to plan of care for management of CHF  Language Barrier - Spanish speaking SDOH: Transportation, Lack of medical insurance, ongoing access to medications  RNCM Clinical Goal(s):  Patient will work with the Care Management team over the next 30 days to address Transition of Care Barriers: Medication Management, Cardiology/Specialty and Provider appointments Verbalize basic understanding of  CHF disease process and self health management plan as evidenced by review of EMR, no hospital readmits in 30 days, and patient able to verbalize signs and symptoms of heart failure  exacerbation, compliance with home medications, doctor appointments and home self-management to keep disease controlled. Take all medications exactly as prescribed and will call provider for medication related questions as evidenced by review of EMR and verbalized 100% compliance with taking medications as prescribed. Attend all scheduled medical appointments: Primary Care, Specialist as evidenced by review of EMR and report of patient attendance.   Interventions: Evaluation of current treatment plan related to  self management and patient's adherence to plan as established by provider. Utilized [employer] for telephone visit communication, phone number [phone].  Code for this call [phone]. 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 Patient is receptive to AMB referral to assist with medical appointments particularily PCP appointments. Unsure if Cardiology/Heart Failure will continue to provide transporation due to the number of missed/rescheduled appointments. SDOH referral for AMB Referral: For Medical Transportation, Lack of medical insurance, resources for healthy meal choices, pending referral for Cardiac rehab and ongoing access to medications through the [facility] [employer].  Heart Failure Interventions:  (Status:  Goal on track:  NO.) Short Term Goal Reassessed past week weight readings and again the patient states his daily weight readings continue to stay between 260 lbs to 262 lbs since he has been discharged.  Encouraged to continue with daily weights and keeping a log to share with Cardiologist at his upcoming follow-up appointment. Reviewed with patient attendance at his previous appointments - Patient stated that he did not make his appointments to see his PCP or Cardiologist because of his job and or transporation.  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  He was unable to attend his appointment with Dr. Marylu because he had to work.  He has not re-scheduled that appointment yet.  RNCM advised patient to call Dr. Francesca office first thing Monday, 1/16 to reschedule for a new date and time.  I strongly encouraged patient to make his follow-up appointments as it is these appointments, taking his medications, and diligent self-help management that increase his likelihood of staying out of the hospital.  Patient verbalized understood. RNCM offered to follow up with him on Monday, 09/24/23 to see if he needs assistance re-scheduling his PCP appointment.  Patient agrees. Reviewed/discussed his current medications and whether or not he still had enough medication to last until his appointments next week.  Patient confirmed that he felt like he only had 2 or 3 days left and he has not requested their refills.   RNCM provided patient with the [facility] Transition of Care Pharmacy and advised they are open on Saturdays and to please call and request refills for the medications he is running out prior to his 09/27/23 appointment.  Patient verbalized understanding and stated he would be sure to call tomorrow.  Reassess social determinant of health barriers - Medicaid application - patient does not know the status of his Medicaid application or how to find out, and is lacking in community resources for medical transportation and community resources to assist with food, utilities and other programs available to help relieve financial burden.  Patient is in agreement to the submittal of a social  worker program to see if they can help him in these areas.  Ongoing Coordination/collaboration with Jenna, LCSW at [facility] re: ways to assist patient with being seen by his Cardiologist, I.e. virtual visits, ParaMedicine etc. Her phone number is  [phone].  Patient Goals/Self-Care Activities:  Participate in Transition of Care Program/Attend [facility] scheduled calls Notify RN Care Manager of TOC call rescheduling needs Take all medications as prescribed and request refills 3-5 days prior to running out. Patient to re-schedule his PCP appointment with Dr. Marylu when they open on Monday morning to schedule INITIAL post hospitalization appointment. Weigh myself daily and keep a log of daily weights;  call Cardiology office if I gain 2 pounds in one night or 3-4 pounds in 1-2 days. Know when to call the doctor: shortness of breath, increased swelling in legs and ankles, weight gain more than 2 lbs overnight or 5 pounds in 1 week.  Follow Up Plan:  Telephone follow up appointment with care management team member scheduled for:  09/2023 at 4:15 PM. The patient has been provided with contact information for the care management team and has been advised to call with any health related questions or concerns.  Next PCP appointment scheduled for Dr, Norman Marylu:   Mr. [name] will re-schedule this appointment when the office opens on Monday, 09/24/23.. Cardiology appointment re-scheduled for:  1/16/625 Dr. [name]  Follow-Up care is a key part of your home self management plan and safe recovery at home.  [place] Lunger BSN, RN RN Care Manager   Transitions of Care VBCI - [facility] Direct Dial Number:  [phone]         Plan: Telephone follow up appointment with care management team member scheduled for: 09/27/23 AT 9 AM.  Follow up with PCP provider re: re-scheduling your post hospitalization follow-up appointment.  It is very important to attend all your doctor appointments and to take all your prescriptions as ordered by your doctors*  Alauna Hayden Lunger BSN, RN Medical Illustrator / Transitions of Care [facility] Direct Dial Number:  [phone]

## 2023-09-24 ENCOUNTER — Telehealth: Payer: Self-pay | Admitting: *Deleted

## 2023-09-24 NOTE — Progress Notes (Signed)
 Complex Care Management Note  Care Guide Note 09/24/2023 Name: Justin [name] MRN: [id number] DOB: September 18, 1980  Justin [name] is a 43 y.o. year old adult who sees [name], [place] for primary care. I reached out to Bj's Solorio by phone today to offer complex care management services.  Justin [name] was given information about Complex Care Management services today including:   The Complex Care Management services include support from the care team which includes your Nurse Coordinator, Clinical Social Worker, or Pharmacist.  The Complex Care Management team is here to help remove barriers to the health concerns and goals most important to you. Complex Care Management services are voluntary, and the patient may decline or stop services at any time by request to their care team member.   Complex Care Management Consent Status: Patient agreed to services and verbal consent obtained.   Follow up plan:  Telephone appointment with complex care management team member scheduled for:  10/08/23  Encounter Outcome:  Patient Scheduled [employer] ID# [id number] named Justin [name] [facility]  Care Coordination Care Guide  Direct Dial: [phone]

## 2023-09-26 ENCOUNTER — Telehealth (HOSPITAL_COMMUNITY): Payer: Self-pay

## 2023-09-26 NOTE — Telephone Encounter (Signed)
 Called to confirm/remind patient of their appointment at the [facility] on 09/27/23.   Patient reminded to bring all medications and/or complete list.  Confirmed patient has transportation. Gave directions, instructed to utilize valet parking.  Confirmed appointment prior to ending call.

## 2023-09-27 ENCOUNTER — Encounter (HOSPITAL_COMMUNITY): Payer: Self-pay

## 2023-09-27 ENCOUNTER — Other Ambulatory Visit: Payer: Self-pay

## 2023-09-27 NOTE — Patient Outreach (Signed)
Care Coordination   Provider Care Coordination    09/27/2023 Name: Justin [name] MRN: [id number] DOB: 02/19/1981  Justin [name] is a 43 y.o. year old adult who sees Justin [name], [place] for primary care. I spoke with  Justin [name] by phone today.  What matters to the patients health and wellness today?  Medication Adherence, Follow-Up Doctor Appointments    Goals Addressed             This Visit's Progress    COMPLETED: Transition of care       Current Barriers:  Adherence Medication management  Adherence Provider appointments  Knowledge Deficits related to plan of care for management of CHF  Language Barrier - Spanish speaking  RNCM Clinical Goal(s):  Patient will work with the Care Management team over the next 30 days to address Transition of Care Barriers: Medication Management, Cardiology/Specialty and Provider appointments Verbalize basic understanding of  CHF disease process and self health management plan as evidenced by review of EMR, no hospital readmits in 30 days, and patient able to verbalize signs and symptoms of heart failure exacerbation, compliance with home medications, doctor appointments and home self-management to keep disease controlled. Take all medications exactly as prescribed and will call provider for medication related questions as evidenced by review of EMR and verbalized 100% compliance with taking medications as prescribed. Attend all scheduled medical appointments: Primary Care, Specialist as evidenced by review of EMR and report of patient attendance.   Interventions: Evaluation of current treatment plan related to  self management and patient's adherence to plan as established by provider. Utilized [employer] for telephone visit communication, phone number [phone].  Code for this call [phone]. Discussed/reviewed barriers to patient's last two missed appointments: (1) Said his  cab was late to Cardiology appt and (2) He had to work at the time of his PCP appointment. Patient is receptive to AMB referral to assist with medical appointments particularily PCP appointments. Unsure if Cardiology/Heart Failure will continue to provide transporation due to the number of missed/rescheduled appointments. SDOH referral: For Medical Transportation, Lack of medical insurance - has open referral for Cardiac rehab and ongoing access to medications through the [facility] [employer].  Heart Failure Interventions:  (Status:  Goal on track:  NO.) Short Term Goal Reassessed past week weight readings and again the patient states his daily weight readings continue to stay between 260 lbs to 262 lbs since he has been discharged.  Encouraged to continue with daily weights and keeping a log to share with Cardiologist at his upcoming follow-up appointment. Reviewed with patient attendance at his previous appointments - Patient stated that he did not make his appointments to see his PCP or Cardiologist because of his job and or transporation.  He indicated that his cab was late for cardiology appointment and they rescheduled him to 09/27/23.  He was unable to attend his appointment with Dr. Annie [name] because he had to work.  He has not re-scheduled that appointment yet.  RNCM advised patient to call Dr. Ronald [name] office first thing Monday, 1/16 to reschedule for a new date and time.  I strongly encouraged patient to make his follow-up appointments as it is these appointments, taking his medications, and diligent self-help management that increase his likelihood of staying out of the hospital.  Patient verbalized understood. RNCM offered to follow up with him on Monday, 09/24/23 to see if he needs assistance re-scheduling his PCP appointment.  Patient agrees. Reviewed/discussed  his current medications and whether or not he still had enough medication to last until his appointments next week.  Patient  confirmed that he felt like he only had 2 or 3 days left and he has not requested their refills.   RNCM provided patient with the [facility] Care Pharm[facility] and advised they are open on Saturdays and to please call and request refills for the medications he is running out prior to his 09/27/23 appointment.  Patient verbalized understanding and stated he would be sure to call tomorrow.  Reassess social determinant of health barriers - Medicaid application - patient does not know the status of his Medicaid application or how to find out, and is lacking in community resources for medical transportation and community resources to assist with food, utilities and other programs available to help relieve financial burden.  Patient is in agreement to the submittal of a Child psychotherapist program to see if they can help him in these areas.  Ongoing Coordination/collaboration with Justin Stanford, LCSW at [facility] re: ways to assist patient with being seen by his Cardiologist, I.e. virtual visits, ParaMedicine etc. Her phone number is [phone].  Patient Goals/Self-Care Activities:  Participate in Transition of Care Program/Attend [facility] scheduled calls Notify RN Care Manager of TOC call rescheduling needs Take all medications as prescribed and request refills 3-5 days prior to running out. Patient to re-schedule his PCP appointment with Dr. Annie [name] when they open on Monday morning to schedule INITIAL post hospitalization appointment. Weigh myself daily and keep a log of daily weights;  call Cardiology office if I gain 2 pounds in one night or 3-4 pounds in 1-2 days. Know when to call the doctor: shortness of breath, increased swelling in legs and ankles, weight gain more than 2 lbs overnight or 5 pounds in 1 week.  Follow Up Plan:  Telephone follow up appointment with care management team member scheduled for:  09/2023 at 4:15 PM. The patient has been provided with contact information for the  care management team and has been advised to call with any health related questions or concerns.  Next PCP appointment scheduled for Dr, Justin [name]:   Mr. Ambrose Mantle will re-schedule this appointment when the office opens on Monday, 09/24/23.. Cardiology appointment re-scheduled for:  1/16/625 Dr. Marca Ancona  Follow-Up care is a key part of your home self management plan and safe recovery at home.  Norrin Shreffler Daphine Deutscher BSN, RN RN Care Manager   Transitions of Care [facility] [place] Direct Dial Number:  [phone]         SDOH assessments and interventions completed:  Yes    Care Coordination Interventions:  Yes, provided  - Consulted with Team Social Worker regarding barriers perceived and unpreceived in facilitating patient's engagement with the care team and but without getting patient to be seen by his doctor and engaging with providers unsure how to assist further.  Will share this care coordination note with his Care Team so they are aware of his case closure to [facility] program.  Follow up plan: No further intervention required.   Encounter Outcome:  Patient Visit Completed

## 2023-09-28 ENCOUNTER — Telehealth: Payer: Self-pay | Admitting: *Deleted

## 2023-09-28 NOTE — Progress Notes (Signed)
Complex Care Management Note Care Guide Note  09/28/2023 Name: Justin [name] MRN: [id number] DOB: 08-Feb-1981   Complex Care Management Outreach Attempts: An unsuccessful telephone outreach was attempted today to offer the patient information about available complex care management services.  Follow Up Plan:  Additional outreach attempts will be made to offer the patient complex care management information and services.  Intrepreter # [phone] Justin [name] was the name of the pacifici intrepeter they call said the number was presently unavailable and unable to leave message  Encounter Outcome:  No Answer  [facility]  [facility], [facility] Guide  [phone]  [email or web address]

## 2023-10-01 ENCOUNTER — Ambulatory Visit: Payer: Self-pay | Admitting: Licensed Clinical Social Worker

## 2023-10-01 NOTE — Patient Instructions (Signed)
Visit Information  Thank you for taking time to visit with me today. Please don't hesitate to contact me if I can be of assistance to you.   Following are the goals we discussed today:   Goals Addressed             This Visit's Progress    Care Coordination Activites       Care Coordination Interventions: Patient stated that he needs Medicaid and the SW will make a referral to the walk in clinic Patient stated that he lives with friend and runs low on rent and food and he does not have SNAP, SW will mail the food pantry resources and the [place] resources for rental assistance. Sw will follow up on 10/17/2023 at 11:00 am        Our next appointment is by telephone on 10/17/2023 at 11:00 am  Please call the care guide team at [phone] if you need to cancel or reschedule your appointment.   If you are experiencing a Mental Health or Behavioral Health Crisis or need someone to talk to, please call the Suicide and Crisis Lifeline: 988 go to [facility] [address], [place] [phone]) call 911  The patient verbalized understanding of instructions, educational materials, and care plan provided today and DECLINED offer to receive copy of patient instructions, educational materials, and care plan.   Jeanie Cooks, PhD [employer], [facility] Social Worker Direct Dial: [phone]  Fax: [phone]

## 2023-10-01 NOTE — Patient Outreach (Signed)
  Care Coordination   Initial Visit Note   10/01/2023 Name: Nickolis Yanik MRN: [id number] DOB: 01-25-1981  [name] Leevon Maday is a 43 y.o. year old adult who sees [name], [place] for primary care. I spoke with  [name] by phone today.  What matters to the patients health and wellness today?  Medicaid, Food Insecurities and Rental assistance    Goals Addressed             This Visit's Progress    Care Coordination Activites       Care Coordination Interventions: Patient stated that he needs Medicaid and the SW will make a referral to the walk in clinic Patient stated that he lives with friend and runs low on rent and food and he does not have SNAP, SW will mail the food pantry resources and the [place] resources for rental assistance. Sw will follow up on 10/17/2023 at 11:00 am        SDOH assessments and interventions completed:  Yes  SDOH Interventions Today    Flowsheet Row Most Recent Value  SDOH Interventions   Food Insecurity Interventions Intervention Not Indicated  [SW Will mail out food pantry list]  Housing Interventions Intervention Not Indicated  [Lives with friends]  Transportation Interventions Intervention Not Indicated  [Friends that he lives with provides transportation]  Utilities Interventions Intervention Not Indicated        Care Coordination Interventions:  Yes, provided  Interventions Today    Flowsheet Row Most Recent Value  General Interventions   General Interventions Discussed/Reviewed General Interventions Reviewed, Community Resources  [Medicaid applicaiton and food resources]        Follow up plan: Follow up call scheduled for 10/17/2023 at 11:00 am    Encounter Outcome:  Patient Visit Completed  Jeanie Cooks, PhD [facility], [facility] Social Worker Direct Dial: [phone]  Fax: [phone]

## 2023-10-08 ENCOUNTER — Encounter: Payer: Self-pay | Admitting: Licensed Clinical Social Worker

## 2023-10-08 ENCOUNTER — Telehealth: Payer: Self-pay | Admitting: *Deleted

## 2023-10-08 NOTE — Progress Notes (Signed)
Complex Care Management Note Care Guide Note  10/08/2023 Name: Justin [name] MRN: [id number] DOB: Jan 13, 1981   Complex Care Management Outreach Attempts: A second unsuccessful outreach was attempted today to offer the patient with information about available complex care management services.  Follow Up Plan:  Additional outreach attempts will be made to offer the patient complex care management information and services.   Encounter Outcome:  No Answer  [facility] Guide  Direct Dial:[phone] [phone] Website: [email or web address]

## 2023-10-10 ENCOUNTER — Telehealth: Payer: Self-pay | Admitting: *Deleted

## 2023-10-10 NOTE — Progress Notes (Signed)
Complex Care Management Note Care Guide Note  10/10/2023 Name: Justin [name] MRN: [id number] DOB: 07-22-1981  Justin [name] is a 43 y.o. year old adult who is a primary care patient of [name], [place] . The community resource team was consulted for assistance with Food Insecurity and Medicaid   SDOH screenings and interventions completed:  No     Care guide performed the following interventions:  Message left with patient sister (emergency contact if brother needs community resources to reach out  .  Follow Up Plan:  patient sister has contact iformation this patients 3x call if he returns call I will attend to his CRR needs as possible   Encounter Outcome:  Patient Visit Completed Dione Booze  [facility] Health[facility] Health Care Guide  Direct Dial:[phone] Fax:[phone] Website: [email or web address]

## 2023-10-17 ENCOUNTER — Ambulatory Visit: Payer: Self-pay | Admitting: Licensed Clinical Social Worker

## 2023-10-17 NOTE — Patient Instructions (Signed)
 Visit Information  Thank you for taking time to visit with me today. Please don't hesitate to contact me if I can be of assistance to you.   Following are the goals we discussed today:   Goals Addressed             This Visit's Progress    Care Coordination Activites       Care Coordination Interventions: Patient stated that he needs Medicaid and the SW will make a referral to the walk in clinic Patient stated that he lives with friend and runs low on rent and food and he does not have SNAP, SW will mail the food pantry resources and the [place] resources for rental assistance. Update on 10/17/2023 visit, Patient had to move from where hs was living and SW will remail to the new address and SW will mail the medicaid application or provide the walk in clinic information in the letter. Sw will follow up on 11/07/2023 at 11:15 am        Our next appointment is by telephone on 11/07/2023 at 11:15 am  Please call the care guide team at [phone] if you need to cancel or reschedule your appointment.   If you are experiencing a Mental Health or Behavioral Health Crisis or need someone to talk to, please call the Suicide and Crisis Lifeline: 988 go to [facility] [address] [place] [phone]) call 911  The patient verbalized understanding of instructions, educational materials, and care plan provided today and DECLINED offer to receive copy of patient instructions, educational materials, and care plan.   [name], PhD [facility], [facility] Social Worker Direct Dial: [phone]  Fax: [phone]

## 2023-10-17 NOTE — Patient Outreach (Signed)
  Care Coordination   Follow Up Visit Note   10/17/2023 Name: Justin [name] MRN: [id number] DOB: Apr 04, 1981  Justin [name] Justin [name] is a 43 y.o. year old adult who sees Justin [name], [place] for primary care. I spoke with  Justin [name] Justin [name] by phone today.  What matters to the patients health and wellness today?  Housing and food resources    Goals Addressed             This Visit's Progress    Care Coordination Activites       Care Coordination Interventions: Patient stated that he needs Medicaid and the SW will make a referral to the walk in clinic Patient stated that he lives with friend and runs low on rent and food and he does not have SNAP, SW will mail the food pantry resources and the Justin [name] resources for rental assistance. Update on 10/17/2023 visit, Patient had to move from where hs was living and SW will remail to the new address and SW will mail the medicaid application or provide the walk in clinic information in the letter. Sw will follow up on 11/07/2023 at 11:15 am        SDOH assessments and interventions completed:  Yes  SDOH Interventions Today    Flowsheet Row Most Recent Value  SDOH Interventions   Food Insecurity Interventions Community Resources Provided  [Patient had to move from where hs was living and SW will remail to the new address]  Housing Interventions Community Resources Provided  [Patient had to move from where hs was living and SW will remail to the new address]  Transportation Interventions Intervention Not Indicated  Utilities Interventions Intervention Not Indicated        Care Coordination Interventions:  Yes, provided  Interventions Today    Flowsheet Row Most Recent Value  General Interventions   General Interventions Discussed/Reviewed General Interventions Reviewed, [employer] had to move from where hs was living and SW will remail to the new address]        Follow up  plan: Follow up call scheduled for 10/28/2023 at 11:15 am    Encounter Outcome:  Patient Visit Completed   Justin [name] Justin HEDWIG, PhD [facility], [facility] Social Worker Direct Dial: [phone]  Fax: [phone]

## 2023-10-25 IMAGING — MR MR CARD MORPHOLOGY WO/W CM
45 of 48 series · 45 of 48 positions shown · IV contrast (Contrast agent)
Comparison: none

CLINICAL DATA: 41M with NICM. Echo with EF 30-35%. Cath shows no
obstructive CAD.

EXAM:
CARDIAC MRI
TECHNIQUE: The patient was scanned on a 1.5 Tesla Siemens magnet. A dedicated
cardiac coil was used. Functional imaging was done using Fiesta
sequences. [DATE], and 4 chamber views were done to assess for RWMA's.
Modified Elle rule using a short axis stack was used to
calculate an ejection fraction on a dedicated work station using
Circle software. The patient received 10 cc of Gadavist. After 10
minutes inversion recovery sequences were used to assess for
infiltration and scar tissue.
CONTRAST:  10 cc  of Gadavist

[Series 4: [id number]_bh · axial · 8.0mm · 1.60mm/px · 1 of 19 slices shown]
[im 1/19]
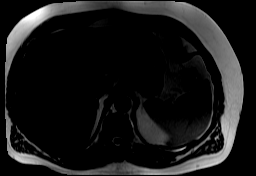

[Series 8: bSSFP · oblique · 8.0mm · 1.61mm/px · 1 of 25 slices shown (1 of 26)]
[im 1/25]
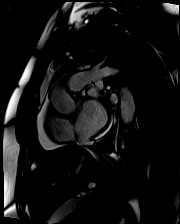

[Series 9: bSSFP · oblique · 8.0mm · 1.61mm/px · 1 of 25 slices shown (2 of 26)]
[im 1/25]
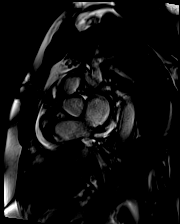

[Series 10: bSSFP · oblique · 8.0mm · 1.61mm/px · 1 of 25 slices shown (3 of 26)]
[im 1/25]
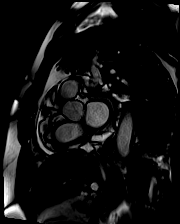

[Series 11: bSSFP · oblique · 8.0mm · 1.61mm/px · 1 of 25 slices shown (4 of 26)]
[im 1/25]
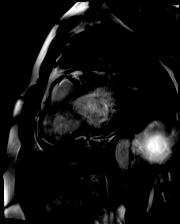

[Series 12: bSSFP · oblique · 8.0mm · 1.61mm/px · 1 of 25 slices shown (5 of 26)]
[im 1/25]
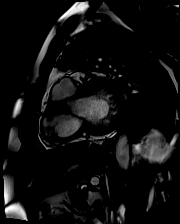

[Series 13: bSSFP · oblique · 8.0mm · 1.61mm/px · 1 of 25 slices shown (6 of 26)]
[im 1/25]
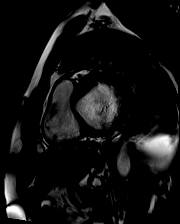

[Series 14: bSSFP · oblique · 8.0mm · 1.61mm/px · 1 of 25 slices shown (7 of 26)]
[im 1/25]
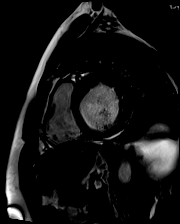

[Series 15: bSSFP · oblique · 8.0mm · 1.61mm/px · 1 of 25 slices shown (8 of 26)]
[im 1/25]
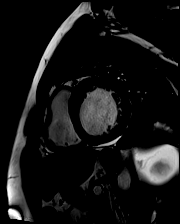

[Series 16: bSSFP · oblique · 8.0mm · 1.61mm/px · 1 of 25 slices shown (9 of 26)]
[im 1/25]
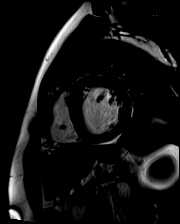

[Series 17: bSSFP · oblique · 8.0mm · 1.61mm/px · 1 of 25 slices shown (10 of 26)]
[im 1/25]
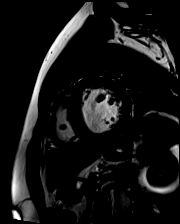

[Series 18: bSSFP · oblique · 8.0mm · 1.61mm/px · 1 of 25 slices shown (11 of 26)]
[im 1/25]
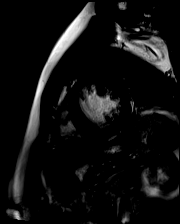

[Series 19: bSSFP · oblique · 8.0mm · 1.61mm/px · 1 of 25 slices shown (12 of 26)]
[im 1/25]
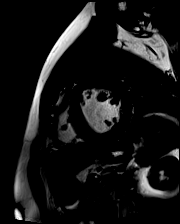

[Series 20: bSSFP · oblique · 8.0mm · 1.61mm/px · 1 of 25 slices shown (13 of 26)]
[im 1/25]
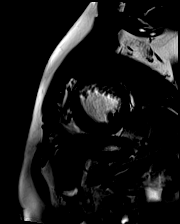

[Series 21: bSSFP · oblique · 8.0mm · 1.61mm/px · 1 of 25 slices shown (14 of 26)]
[im 1/25]
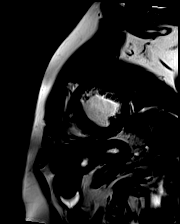

[Series 22: bSSFP · oblique · 8.0mm · 1.61mm/px · 1 of 25 slices shown (15 of 26)]
[im 1/25]
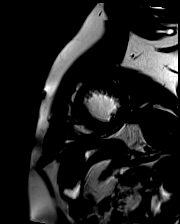

[Series 23: bSSFP · oblique · 8.0mm · 1.61mm/px · 1 of 25 slices shown (16 of 26)]
[im 1/25]
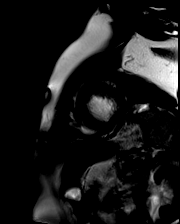

[Series 24: bSSFP · oblique · 8.0mm · 1.61mm/px · 1 of 25 slices shown (17 of 26)]
[im 1/25]
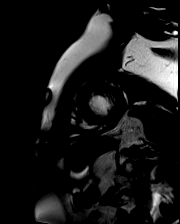

[Series 25: bSSFP · oblique · 8.0mm · 1.61mm/px · 1 of 25 slices shown (18 of 26)]
[im 1/25]
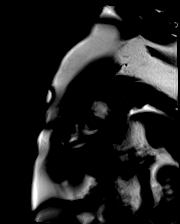

[Series 26: bSSFP · oblique · 8.0mm · 1.61mm/px · 1 of 25 slices shown (19 of 26)]
[im 1/25]
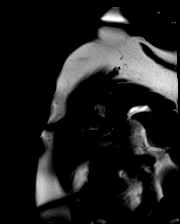

[Series 27: bSSFP · oblique · 8.0mm · 1.61mm/px · 1 of 25 slices shown (20 of 26)]
[im 1/25]
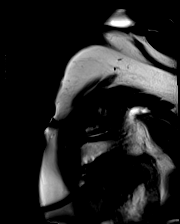

[Series 28: bSSFP · oblique · 8.0mm · 1.61mm/px · 1 of 25 slices shown (21 of 26)]
[im 1/25]
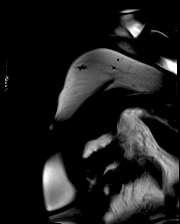

[Series 29: bSSFP · oblique · 8.0mm · 1.61mm/px · 1 of 25 slices shown (22 of 26)]
[im 1/25]
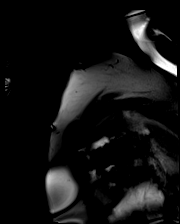

[Series 30: bSSFP · oblique · 8.0mm · 1.61mm/px · 1 of 25 slices shown (23 of 26)]
[im 1/25]
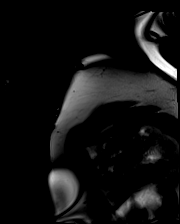

[Series 31: bSSFP · axial · 6.0mm · 1.41mm/px · 1 of 25 slices shown (24 of 26)]
[im 1/25]
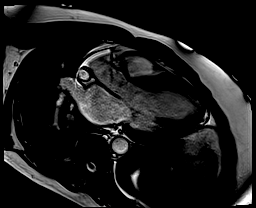

[Series 32: bSSFP · oblique · 6.0mm · 1.41mm/px · 1 of 25 slices shown (25 of 26)]
[im 1/25]
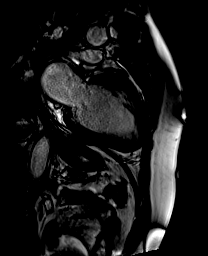

[Series 33: bSSFP · axial · 6.0mm · 1.41mm/px · 1 of 25 slices shown (26 of 26)]
[im 1/25]
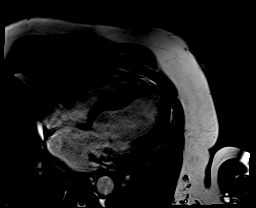

[Series 34: (id)_long_t1 · oblique · 8.0mm · 1.56mm/px · 1 of 24 slices shown]
[im 1/24]
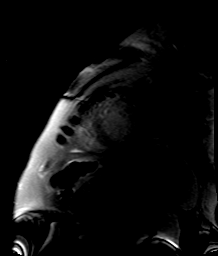

[Series 35: (id)_long_t1_moco · oblique · 8.0mm · 1.56mm/px · 1 of 24 slices shown]
[im 1/24]
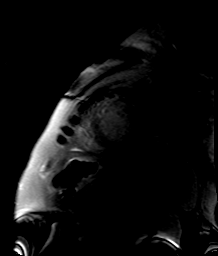

[Series 36: (id)_long_t1_moco_t1 · oblique · 8.0mm · 1.56mm/px · 1 of 6 slices shown]
[im 1/6]
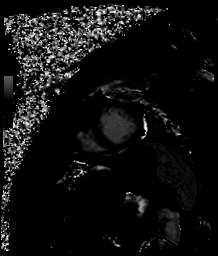

[Series 38: (id)_trufi · oblique · 8.0mm · 2.08mm/px · 1 of 9 slices shown]
[im 1/9]
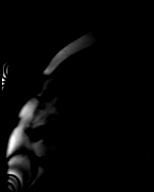

[Series 39: (id)_trufi_moco · oblique · 8.0mm · 2.08mm/px · 1 of 9 slices shown]
[im 1/9]
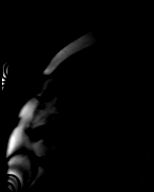

[Series 40: (id)_trufi_moco_t2 · oblique · 8.0mm · 2.08mm/px · 1 of 4 slices shown]
[im 1/4]
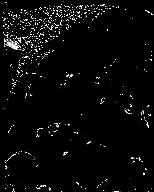

[Series 42: cine_trufi_cs_rt_short axis · oblique · 8.0mm · 1.73mm/px · 1 of 13 slices shown (1 of 12)]
[im 1/13]
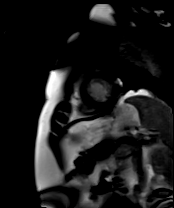

[Series 42: cine_trufi_cs_rt_short axis · oblique · 8.0mm · 1.73mm/px · 1 of 13 slices shown (2 of 12)]
[im 1/13]
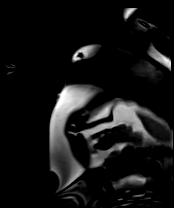

[Series 42: cine_trufi_cs_rt_short axis · oblique · 8.0mm · 1.73mm/px · 1 of 13 slices shown (3 of 12)]
[im 1/13]
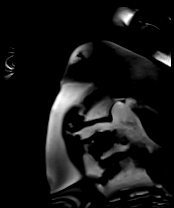

[Series 42: cine_trufi_cs_rt_short axis · oblique · 8.0mm · 1.73mm/px · 1 of 13 slices shown (4 of 12)]
[im 1/13]
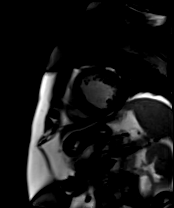

[Series 42: cine_trufi_cs_rt_short axis · oblique · 8.0mm · 1.73mm/px · 1 of 13 slices shown (5 of 12)]
[im 1/13]
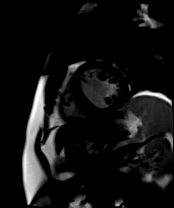

[Series 42: cine_trufi_cs_rt_short axis · oblique · 8.0mm · 1.73mm/px · 1 of 13 slices shown (6 of 12)]
[im 1/13]
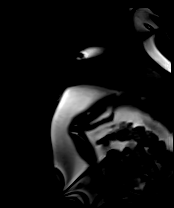

[Series 42: cine_trufi_cs_rt_short axis · oblique · 8.0mm · 1.73mm/px · 1 of 13 slices shown (7 of 12)]
[im 1/13]
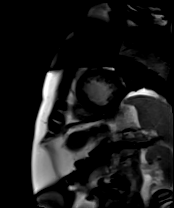

[Series 42: cine_trufi_cs_rt_short axis · oblique · 8.0mm · 1.73mm/px · 1 of 13 slices shown (8 of 12)]
[im 1/13]
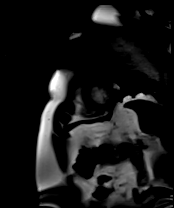

[Series 42: cine_trufi_cs_rt_short axis · oblique · 8.0mm · 1.73mm/px · 1 of 13 slices shown (9 of 12)]
[im 1/13]
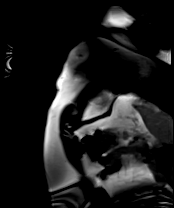

[Series 42: cine_trufi_cs_rt_short axis · oblique · 8.0mm · 1.73mm/px · 1 of 13 slices shown (10 of 12)]
[im 1/13]
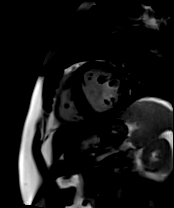

[Series 42: cine_trufi_cs_rt_short axis · oblique · 8.0mm · 1.73mm/px · 1 of 13 slices shown (11 of 12)]
[im 1/13]
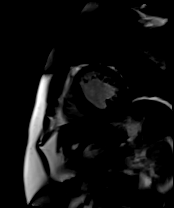

[Series 42: cine_trufi_cs_rt_short axis · oblique · 8.0mm · 1.73mm/px · 1 of 13 slices shown (12 of 12)]
[im 1/13]
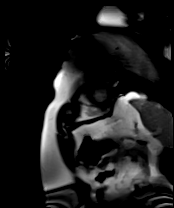

[45 of 48 positions shown; findings below may reference images not displayed]

FINDINGS: Left ventricle:

-Asymmetric hypertrophy measuring 17mm in basal septum (10mm in
posterior wall)

-Severe dilatation

-Severe systolic dysfunction

-Nonspecific ECV elevation (31%)

-Normal myocardial T2 values

-Patchy LGE in septum and RV insertion sites, along with
subepicardial LGE in inferolateral wall. LGE accounts for 12% of
total myocardial mass

LV EF: 29% (Normal 56-78%)

Absolute volumes:

LV EDV: 348mL (Normal 77-195 mL)

LV ESV: 248mL (Normal 19-72 mL)

LV SV: 100mL (Normal 51-133 mL)

CO: 8.1L/min (Normal 2.8-8.8 L/min)

Indexed volumes:

LV EDV: 164mL/sq-m (Normal 47-92 mL/sq-m)

LV ESV: 117mL/sq-m (Normal 13-30 mL/sq-m)

LV SV: 47mL/sq-m (Normal 32-62 mL/sq-m)

CI: 3.9L/min/sq-m (Normal 1.7-4.2 L/min/sq-m)

Right ventricle: Normal size with mild systolic dysfunction

RV EF:  45% (Normal 47-74%)

Absolute volumes:

RV EDV: 194mL (Normal 88-227 mL)

RV ESV: 107mL (Normal 23-103 mL)

RV SV: 87mL (Normal 52-138 mL)

CO: 7.1L/min (Normal 2.8-8.8 L/min)

Indexed volumes:

RV EDV: 92mL/sq-m (Normal 55-105 mL/sq-m)

RV ESV: 51mL/sq-m (Normal 15-43 mL/sq-m)

RV SV: 41mL/sq-m (Normal 32-64 mL/sq-m)

CI: 3.4L/min/sq-m (Normal 1.7-4.2 L/min/sq-m)

Left atrium: Moderate enlargement

Right atrium: Mild enlargement

Mitral valve: Mild regurgitation

Aortic valve: No regurgitation

Tricuspid valve: Trivial regurgitation

Pulmonic valve: No regurgitation

Aorta: Normal proximal ascending aorta

Pericardium: Small effusion
IMPRESSION: 1.  Severe LV dilatation with severe systolic dysfunction (EF 29%)

2. Asymmetric LV hypertrophy measuring 17mm in basal septum (10mm in
posterior wall), meeting criteria for hypertrophic cardiomyopathy

3. Patchy LGE in septum and RV insertion sites, along with
subepicardial LGE in inferolateral wall. LGE accounts for 12% of
total myocardial mass. While scar pattern is consistent with HCM,
the subepicardial LGE in inferolateral wall is atypical, and would
also consider sarcoidosis in the differential. While suspect HCM is
most likely diagnosis, sarcoid can mimick HCM MRI findings, and in
setting of atypical HCM LGE pattern and mediastinal lymphadenopathy,
recommend cardiac PET to evaluate for sarcoid

4.  Normal RV size with mild systolic dysfunction (EF 45%)

5.  Small pericardial effusion

## 2023-10-26 ENCOUNTER — Other Ambulatory Visit: Payer: Self-pay

## 2023-10-26 ENCOUNTER — Other Ambulatory Visit (HOSPITAL_COMMUNITY): Payer: Self-pay

## 2023-10-30 ENCOUNTER — Telehealth (HOSPITAL_COMMUNITY): Payer: Self-pay

## 2023-10-30 NOTE — Telephone Encounter (Signed)
Front office staff utilized interpreter telephonic services to try and call this patient at [phone]. Spanish interpreter said that the telephone call was not answered and the message stated that this call is not in service?   Front office then instructed spanish interpreter to call this patients sister to try and schedule a follow up appointment with Dr. Kathlyn Sacramento PA-C ([facility]).   Spanish interpreter called sister of patient at [phone] and spoke to sister.   Isa Rankin Sister Emergency Contact [phone] Needs interp: Spanish  Per patients sister, she will call this patient and inform him that he will need to schedule a f/u appointment in the [facility].   Front office gave patients sister [facility] office number and front office staff member verified this patients telephone number on file with [facility]

## 2023-11-07 ENCOUNTER — Encounter: Payer: Self-pay | Admitting: Licensed Clinical Social Worker

## 2023-11-07 NOTE — Patient Instructions (Signed)
 Visit Information  Thank you for taking time to visit with me today. Please don't hesitate to contact me if I can be of assistance to you.   Following are the goals we discussed today:   Goals Addressed             This Visit's Progress    COMPLETED: Care Coordination Activites       Care Coordination Interventions: Patient stated that he needs Medicaid and the SW will make a referral to the walk in clinic Patient stated that he lives with friend and runs low on rent and food and he does not have SNAP, SW will mail the food pantry resources and the [place] resources for rental assistance. Update on 10/17/2023 visit, Patient had to move from where hs was living and SW will remail to the new address and SW will mail the medicaid application or provide the walk in clinic information in the letter. Sw will follow up on 11/07/2023 at 11:15 am        No further follow up needed. Patient has relocated to [place]  Please call the care guide team at [phone] if you need to cancel or reschedule your appointment.   If you are experiencing a Mental Health or Behavioral Health Crisis or need someone to talk to, please call the Suicide and Crisis Lifeline: 988 go to [facility] [address], [place] [phone]) call 911  The patient verbalized understanding of instructions, educational materials, and care plan provided today and DECLINED offer to receive copy of patient instructions, educational materials, and care plan.   Jeanie Cooks, PhD [facility], [facility] Social Worker Direct Dial: [phone]  Fax: [phone]

## 2023-11-07 NOTE — Patient Outreach (Signed)
 Care Coordination   Follow Up Visit Note   11/07/2023 Name: Justin [name] MRN: [id number] DOB: 1981/03/27  Justin [name] Justin [name] is a 43 y.o. year old adult who sees [name], [place] for primary care. I spoke with  [name] by phone today.  What matters to the patients health and wellness today?  Food insecurities and Medicaid     Goals Addressed             This Visit's Progress    COMPLETED: Care Coordination Activites       Care Coordination Interventions: Patient stated that he needs Medicaid and the SW will make a referral to the walk in clinic Patient stated that he lives with friend and runs low on rent and food and he does not have SNAP, SW will mail the food pantry resources and the [place] resources for rental assistance. Update on 10/17/2023 visit, Patient had to move from where hs was living and SW will remail to the new address and SW will mail the medicaid application or provide the walk in clinic information in the letter. Sw will follow up on 11/07/2023 at 11:15 am        SDOH assessments and interventions completed:  Yes  SDOH Interventions Today    Flowsheet Row Most Recent Value  SDOH Interventions   Food Insecurity Interventions Intervention Not Indicated  Housing Interventions Intervention Not Indicated  Transportation Interventions Intervention Not Indicated  Utilities Interventions Intervention Not Indicated        Care Coordination Interventions:  Yes, provided  Interventions Today    Flowsheet Row Most Recent Value  General Interventions   General Interventions Discussed/Reviewed General Interventions Reviewed, [employer] has relocted to [place]]        Follow up plan: No further intervention required.   Encounter Outcome:  Patient Visit Completed   Jeanie Cooks, PhD [facility], [facility] Social Worker Direct Dial:  [phone]  Fax: [phone]

## 2024-03-09 ENCOUNTER — Inpatient Hospital Stay (HOSPITAL_COMMUNITY)
Admission: EM | Admit: 2024-03-09 | Discharge: 2024-03-19 | DRG: 291 | Disposition: A | Discharge status: Home / Self Care | Payer: Self-pay | Attending: Internal Medicine | Admitting: Internal Medicine

## 2024-03-09 ENCOUNTER — Emergency Department (HOSPITAL_COMMUNITY): Payer: Self-pay

## 2024-03-09 ENCOUNTER — Encounter (HOSPITAL_COMMUNITY): Payer: Self-pay | Admitting: Emergency Medicine

## 2024-03-09 DIAGNOSIS — I5082 Biventricular heart failure: Secondary | ICD-10-CM | POA: Diagnosis present

## 2024-03-09 DIAGNOSIS — J9601 Acute respiratory failure with hypoxia: Secondary | ICD-10-CM | POA: Diagnosis present

## 2024-03-09 DIAGNOSIS — Z5941 Food insecurity: Secondary | ICD-10-CM

## 2024-03-09 DIAGNOSIS — E785 Hyperlipidemia, unspecified: Secondary | ICD-10-CM | POA: Diagnosis present

## 2024-03-09 DIAGNOSIS — F1010 Alcohol abuse, uncomplicated: Secondary | ICD-10-CM | POA: Diagnosis present

## 2024-03-09 DIAGNOSIS — N179 Acute kidney failure, unspecified: Secondary | ICD-10-CM | POA: Diagnosis present

## 2024-03-09 DIAGNOSIS — E1122 Type 2 diabetes mellitus with diabetic chronic kidney disease: Secondary | ICD-10-CM | POA: Diagnosis present

## 2024-03-09 DIAGNOSIS — Z603 Acculturation difficulty: Secondary | ICD-10-CM | POA: Diagnosis present

## 2024-03-09 DIAGNOSIS — Z6834 Body mass index (BMI) 34.0-34.9, adult: Secondary | ICD-10-CM

## 2024-03-09 DIAGNOSIS — I160 Hypertensive urgency: Secondary | ICD-10-CM | POA: Diagnosis present

## 2024-03-09 DIAGNOSIS — I484 Atypical atrial flutter: Secondary | ICD-10-CM | POA: Diagnosis present

## 2024-03-09 DIAGNOSIS — I130 Hypertensive heart and chronic kidney disease with heart failure and stage 1 through stage 4 chronic kidney disease, or unspecified chronic kidney disease: Principal | ICD-10-CM | POA: Diagnosis present

## 2024-03-09 DIAGNOSIS — I5023 Acute on chronic systolic (congestive) heart failure: Principal | ICD-10-CM | POA: Diagnosis present

## 2024-03-09 DIAGNOSIS — Z8249 Family history of ischemic heart disease and other diseases of the circulatory system: Secondary | ICD-10-CM

## 2024-03-09 DIAGNOSIS — F1410 Cocaine abuse, uncomplicated: Secondary | ICD-10-CM | POA: Diagnosis present

## 2024-03-09 DIAGNOSIS — E1165 Type 2 diabetes mellitus with hyperglycemia: Secondary | ICD-10-CM | POA: Diagnosis present

## 2024-03-09 DIAGNOSIS — I5043 Acute on chronic combined systolic (congestive) and diastolic (congestive) heart failure: Secondary | ICD-10-CM | POA: Diagnosis present

## 2024-03-09 DIAGNOSIS — I480 Paroxysmal atrial fibrillation: Secondary | ICD-10-CM | POA: Diagnosis present

## 2024-03-09 DIAGNOSIS — E669 Obesity, unspecified: Secondary | ICD-10-CM | POA: Diagnosis present

## 2024-03-09 DIAGNOSIS — I428 Other cardiomyopathies: Secondary | ICD-10-CM | POA: Diagnosis present

## 2024-03-09 DIAGNOSIS — I5022 Chronic systolic (congestive) heart failure: Secondary | ICD-10-CM | POA: Diagnosis present

## 2024-03-09 DIAGNOSIS — Z7984 Long term (current) use of oral hypoglycemic drugs: Secondary | ICD-10-CM

## 2024-03-09 DIAGNOSIS — E1169 Type 2 diabetes mellitus with other specified complication: Secondary | ICD-10-CM | POA: Diagnosis present

## 2024-03-09 DIAGNOSIS — F5105 Insomnia due to other mental disorder: Secondary | ICD-10-CM | POA: Diagnosis present

## 2024-03-09 DIAGNOSIS — Z555 Less than a high school diploma: Secondary | ICD-10-CM

## 2024-03-09 DIAGNOSIS — Z608 Other problems related to social environment: Secondary | ICD-10-CM | POA: Diagnosis present

## 2024-03-09 DIAGNOSIS — N1831 Chronic kidney disease, stage 3a: Secondary | ICD-10-CM | POA: Diagnosis present

## 2024-03-09 DIAGNOSIS — I10 Essential (primary) hypertension: Secondary | ICD-10-CM | POA: Diagnosis present

## 2024-03-09 DIAGNOSIS — I4719 Other supraventricular tachycardia: Secondary | ICD-10-CM | POA: Diagnosis present

## 2024-03-09 DIAGNOSIS — E876 Hypokalemia: Secondary | ICD-10-CM | POA: Diagnosis not present

## 2024-03-09 DIAGNOSIS — F419 Anxiety disorder, unspecified: Secondary | ICD-10-CM | POA: Diagnosis present

## 2024-03-09 DIAGNOSIS — Z7901 Long term (current) use of anticoagulants: Secondary | ICD-10-CM

## 2024-03-09 DIAGNOSIS — I340 Nonrheumatic mitral (valve) insufficiency: Secondary | ICD-10-CM | POA: Diagnosis present

## 2024-03-09 DIAGNOSIS — Z79899 Other long term (current) drug therapy: Secondary | ICD-10-CM

## 2024-03-09 DIAGNOSIS — Z5971 Insufficient health insurance coverage: Secondary | ICD-10-CM

## 2024-03-09 LAB — HEPATIC FUNCTION PANEL
ALT: 30 U/L (ref 0–44)
AST: 36 U/L (ref 15–41)
Albumin: 2.8 g/dL — ABNORMAL LOW (ref 3.5–5.0)
Alkaline Phosphatase: 89 U/L (ref 38–126)
Bilirubin, Direct: 0.3 mg/dL — ABNORMAL HIGH (ref 0.0–0.2)
Indirect Bilirubin: 0.7 mg/dL (ref 0.3–0.9)
Total Bilirubin: 1 mg/dL (ref 0.0–1.2)
Total Protein: 6.3 g/dL — ABNORMAL LOW (ref 6.5–8.1)

## 2024-03-09 LAB — BASIC METABOLIC PANEL WITH GFR
Anion gap: 13 (ref 5–15)
BUN: 70 mg/dL — ABNORMAL HIGH (ref 6–20)
CO2: 24 mmol/L (ref 22–32)
Calcium: 8.7 mg/dL — ABNORMAL LOW (ref 8.9–10.3)
Chloride: 104 mmol/L (ref 98–111)
Creatinine, Ser: 3.29 mg/dL — ABNORMAL HIGH (ref 0.61–1.24)
GFR, Estimated: 23 mL/min — ABNORMAL LOW (ref >60)
Glucose, Bld: 122 mg/dL — ABNORMAL HIGH (ref 70–99)
Potassium: 3.9 mmol/L (ref 3.5–5.1)
Sodium: 141 mmol/L (ref 135–145)

## 2024-03-09 LAB — BRAIN NATRIURETIC PEPTIDE: B Natriuretic Peptide: 2578.5 pg/mL — ABNORMAL HIGH (ref 0.0–100.0)

## 2024-03-09 LAB — LIPASE, BLOOD: Lipase: 56 U/L — ABNORMAL HIGH (ref 11–51)

## 2024-03-09 LAB — CBC
HCT: 47.4 % (ref 39.0–52.0)
Hemoglobin: 14.5 g/dL (ref 13.0–17.0)
MCH: 27.4 pg (ref 26.0–34.0)
MCHC: 30.6 g/dL (ref 30.0–36.0)
MCV: 89.6 fL (ref 80.0–100.0)
Platelets: 269 10*3/uL (ref 150–400)
RBC: 5.29 MIL/uL (ref 4.22–5.81)
RDW: 15.4 % (ref 11.5–15.5)
WBC: 7 10*3/uL (ref 4.0–10.5)
nRBC: 0 % (ref 0.0–0.2)

## 2024-03-09 LAB — TROPONIN I (HIGH SENSITIVITY)
Troponin I (High Sensitivity): 40 ng/L — ABNORMAL HIGH (ref <18)
Troponin I (High Sensitivity): 44 ng/L — ABNORMAL HIGH (ref <18)

## 2024-03-09 LAB — DIGOXIN LEVEL: Digoxin Level: <0.4 ng/mL — ABNORMAL LOW (ref 0.8–2.0)

## 2024-03-09 MED ORDER — AMIODARONE HCL 200 MG PO TABS: 400.0000 mg | ORAL_TABLET | Freq: Two times a day (BID) | ORAL | Status: DC

## 2024-03-09 MED ORDER — FUROSEMIDE 10 MG/ML IJ SOLN
40.0000 mg | Freq: Once | INTRAMUSCULAR | Status: AC
  Administered 2024-03-09: 40 mg via INTRAVENOUS
  Filled 2024-03-09: qty 4

## 2024-03-09 MED ORDER — DIGOXIN 125 MCG PO TABS
0.1250 mg | ORAL_TABLET | Freq: Every day | ORAL | Status: DC
  Administered 2024-03-09 – 2024-03-10 (×2): 0.125 mg via ORAL
  Filled 2024-03-09 (×2): qty 1

## 2024-03-09 MED ORDER — FUROSEMIDE 10 MG/ML IJ SOLN
80.0000 mg | Freq: Once | INTRAMUSCULAR | Status: AC
  Administered 2024-03-10: 80 mg via INTRAVENOUS
  Filled 2024-03-09: qty 8

## 2024-03-09 MED ORDER — ATORVASTATIN CALCIUM 10 MG PO TABS
20.0000 mg | ORAL_TABLET | Freq: Every day | ORAL | Status: DC
  Administered 2024-03-10 – 2024-03-19 (×10): 20 mg via ORAL
  Filled 2024-03-09 (×10): qty 2

## 2024-03-09 MED ORDER — CARVEDILOL 12.5 MG PO TABS
12.5000 mg | ORAL_TABLET | Freq: Two times a day (BID) | ORAL | Status: DC
  Administered 2024-03-10: 12.5 mg via ORAL
  Filled 2024-03-09: qty 1

## 2024-03-09 NOTE — H&P (Incomplete)
 Date: 03/10/2024               Patient Name:  Justin [name] MRN: [id number]  DOB: 21-Jun-1981 Age / Sex: 43 y.o., adult   PCP: [name], DO         Medical Service: Internal Medicine Teaching Service         Attending Physician: Dr. Karna Fellows, MD      First Contact: Dr. Melvenia Morrison, MD    Second Contact: Dr. Ozell Nearing, DO         After Hours (After 5p/  First Contact Pager: [phone]  weekends / holidays): Second Contact Pager: [phone]   SUBJECTIVE   Chief Complaint: dyspnea and lower extremity edema  History of Present Illness:  Justin [name] is a 43 year old male who was assigned male at birth with a past medical history of HFrEF, paroxysmal A-fib on anticoagulation, T2DM, and hypertension who presents with 2 days of worsening dyspnea, bilateral lower extremity edema, and orthopnea.  He has a history of heart failure with reduced ejection fraction and has been on different medications recently after moving to [place]  for a job and since being on these medications he has not noticed that he is urinating as much and his lower extremity edema has been getting worse over the last week and he started to develop orthopnea and dyspnea on exertion over the last few days.  Denies any presyncope, cough, nausea, vomiting, hematuria, melena, or hematochezia.  He does not know all of his medications by name and does not live with anyone that can verify his medications.  ED Course: Presented hypoxic but responded to supplemental oxygen through nasal cannula.  Hypertensive, afebrile, and not tachycardic.  Labs significant for creatinine of 3.29 with last baseline around 1.6 in December 2024, BNP of 2500, and troponin flat at 40-44.  Chest x-ray with signs of pulmonary vascular congestion.  Past Medical History Heart failure with reduced ejection fraction Paroxysmal A-fib on anticoagulation Type 2 diabetes Hypertension   Meds:  Med list by most  recent pharmacy fill this month, June 2025 Amiodarone 400 mg twice daily Atorvastatin 20 mg daily Bumetanide 1 mg twice daily Glimepiride  2 mg daily Metoprolol  succinate 50 mg twice daily Midodrine 5 mg 3 times daily Warfarin 5 mg daily  Past Surgical History Past Surgical History:  Procedure Laterality Date   RIGHT/LEFT HEART CATH AND CORONARY ANGIOGRAPHY N/A 02/16/2022   Procedure: RIGHT/LEFT HEART CATH AND CORONARY ANGIOGRAPHY;  Surgeon: Wonda Ozell, MD;  Location: [facility] INVASIVE CV LAB;  Service: Cardiovascular;  Laterality: N/A;    Social:  Lives alone and is independent in ADLs and IADLs.  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 PCP: None, will follow up in PCP Substances: Denies tobacco or drug use.  Occasional alcohol use.  Family History:  Family History  Problem Relation Age of Onset   Hypertension Father     Allergies: Allergies as of 03/09/2024   (No Known Allergies)    Review of Systems: A complete ROS was negative except as per HPI.   OBJECTIVE:   Physical Exam: Blood pressure (!) 131/110, pulse (!) 108, temperature 98.2 F (36.8 C), temperature source Oral, resp. rate (!) 25, SpO2 98%.  Constitutional: Obese middle-age male laying in bed. In no acute distress. HENT: Normocephalic, atraumatic Eyes: Sclera non-icteric, PERRL, EOM intact Neck:normal atraumatic, no neck masses, normal thyroid, JVD to  the jawline Cardio: Tachycardic with regular rhythm. 2+ bilateral radial and dorsalis pedis  pulses.  Bilateral upper and lower extremities warm and well-perfused Pulm: Crackles in the bases bilaterally with expiratory wheezing most prominent in the bases. Normal work of breathing on supplemental oxygen through nasal cannula Abdomen: Soft, non-tender, non-distended, positive bowel sounds. MSK: 2+ pitting edema in the lower extremities bilaterally from the knees down Skin:Warm and  dry. Neuro:Alert and oriented x3. No focal deficit noted. Psych:Pleasant mood and affect.  Labs: CBC    Component Value Date/Time   WBC 7.0 03/09/2024 1842   RBC 5.29 03/09/2024 1842   HGB 14.5 03/09/2024 1842   HGB 15.5 03/29/2018 1528   HCT 47.4 03/09/2024 1842   HCT 47.3 03/29/2018 1528   PLT 269 03/09/2024 1842   PLT 227 03/29/2018 1528   MCV 89.6 03/09/2024 1842   MCV 86 03/29/2018 1528   MCH 27.4 03/09/2024 1842   MCHC 30.6 03/09/2024 1842   RDW 15.4 03/09/2024 1842   RDW 13.8 03/29/2018 1528   LYMPHSABS 1.2 08/22/2023 0722   MONOABS 0.7 08/22/2023 0722   EOSABS 0.2 08/22/2023 0722   BASOSABS 0.1 08/22/2023 0722     CMP     Component Value Date/Time   NA 141 03/09/2024 1842   NA 138 09/06/2018 1024   K 3.9 03/09/2024 1842   CL 104 03/09/2024 1842   CO2 24 03/09/2024 1842   GLUCOSE 122 (H) 03/09/2024 1842   BUN 70 (H) 03/09/2024 1842   BUN 23 (H) 09/06/2018 1024   CREATININE 3.29 (H) 03/09/2024 1842   CALCIUM  8.7 (L) 03/09/2024 1842   PROT 6.3 (L) 03/09/2024 2059   ALBUMIN 2.8 (L) 03/09/2024 2059   AST 36 03/09/2024 2059   ALT 30 03/09/2024 2059   ALKPHOS 89 03/09/2024 2059   BILITOT 1.0 03/09/2024 2059   GFRNONAA 23 (L) 03/09/2024 1842   GFRAA 74 09/06/2018 1024    Imaging: DG Chest 2 View Result Date: 03/09/2024 CLINICAL DATA:  cp EXAM: CHEST - 2 VIEW COMPARISON:  August 24, 2023 FINDINGS: Bilateral perihilar interstitial opacities with patchy airspace opacities in the both lung bases. No pleural effusion or pneumothorax. Severe cardiomegaly.No acute fracture or destructive lesion. Multilevel thoracic osteophytosis. IMPRESSION: Cardiomegaly with findings of pulmonary edema. Electronically Signed   By: Rogelia Myers M.D.   On: 03/09/2024 19:26     EKG: personally reviewed my interpretation is sinus tachycardia with LVH. Consistent with prior EKG.  ASSESSMENT & PLAN:   Assessment & Plan by Problem: Principal Problem:   Acute hypoxic respiratory  failure (HCC)   Justin [name] is a 43 y.o. adult with pertinent PMH of HFrEF, paroxysmal A-fib on anticoagulation, T2DM, and hypertension who presented with progressive dyspnea on exertion and lower extremity edema and is admitted for acute hypoxic respiratory failure due to heart failure exacerbation.  Acute hypoxic respiratory failure Acute HFrEF exacerbation Unsure if due to worsening renal function causing decreased efficacy of diuretic therapy or change in medications that are not as effective and now he has a cardiorenal AKI.  Due to this and his comorbid paroxysmal A-fib we will get an updated echocardiogram after last echo 08/2023 which showed LVEF 20 to 25% with severely decreased LV function, global hypokinesis, moderate LV dilation, concentric LVH, mildly reduced RV function, mildly enlarged RV, biatrial dilation, and possible small PFO.  He does have lower extremity edema this is bilateral and equal without other signs of DVT.  Well score 1.5 and already  on anticoagulation.  S/p Lasix  40 mg IV with no increased urine output. - Lasix  80 mg IV, strict ins and outs - Repeat TTE - Daily weights - Change beta-blocker from metoprolol  to carvedilol  12.5 mg twice daily - Wean to room air as tolerated, SpO2 goal 90+  AKI on CKD stage IIIa Creatinine 3.29 up from 1.6 in December 2024.  He is significantly volume overloaded so this could be a cardiorenal AKI however there is a possibility that he has had declining renal function causing diuretic resistance.  UA positive for protein and bacteria however there are squamous epithelial cells present.  He is not oliguric. - Renal ultrasound - Strict ins and outs  Paroxysmal A-fib Pharmacy fill history indicates that he was put on warfarin recently however during my interview he said he was taking Xarelto .  In the past he has been given Xarelto  due to the daily dosing and lack of INR checks. INR here above 10.  Unsure if he is  actually taking warfarin but he did get this filled this month and does seem to have good adherence of his medications at least recently. - repeat PT/INR and heparin  level  T2DM A1c 10.3.  Recently prescribed glimepiride  2 mg daily. - Moderate SSI  Hypertension Mildly hypertensive here in the setting of volume overload.  Will treat with diuretics and carvedilol .  Diet: Heart Healthy VTE: Anticoagulated with DOAC Code: Full  Dispo: Admit patient to Observation with expected length of stay less than 2 midnights.  Signed: Fairy Pool, DO Internal Medicine Resident, PGY-3 Please contact the on call pager at [phone] for any urgent or emergent needs. 1:18 AM 03/10/2024

## 2024-03-09 NOTE — ED Provider Notes (Signed)
 [place] EMERGENCY DEPARTMENT AT [facility] Provider Note   CSN: [id number] Arrival date & time: 03/09/24  1806     Patient presents with: Chest Pain   Justin [name] is a 43 y.o. adult.   HPI Patient presents with concern of chest pain, leg swelling.  Onset was 2 days ago.  He states that he had been taking his medication regularly, no obvious precipitating, alleviating or exacerbating factors.  No fever, there is mild cough.    Prior to Admission medications   Medication Sig Start Date End Date Taking? Authorizing Provider  digoxin  (LANOXIN ) 0.125 MG tablet Take 1 tablet (0.125 mg total) by mouth daily. 08/26/23   Arellano Zameza, Priscila, MD  empagliflozin  (JARDIANCE ) 10 MG TABS tablet Take 1 tablet (10 mg total) by mouth daily. 08/26/23   Arellano Zameza, Priscila, MD  losartan  (COZAAR ) 50 MG tablet Take 1 tablet (50 mg total) by mouth daily. 08/26/23   Arellano Zameza, Priscila, MD  potassium chloride  SA (KLOR-CON  M) 20 MEQ tablet Take 1 tablet (20 mEq total) by mouth daily. 08/25/23   Arellano Zameza, Priscila, MD  rivaroxaban  (XARELTO ) 20 MG TABS tablet Take 1 tablet (20 mg total) by mouth daily with supper. 08/25/23   Arellano Zameza, Priscila, MD  rosuvastatin  (CRESTOR ) 10 MG tablet Take 1 tablet (10 mg total) by mouth daily. 08/25/23 11/25/23  Arellano Zameza, Priscila, MD  spironolactone  (ALDACTONE ) 25 MG tablet Take 1 tablet (25 mg total) by mouth daily. 08/25/23 11/25/23  Arellano Zameza, Priscila, MD  torsemide  (DEMADEX ) 20 MG tablet Take 1 tablet (20 mg total) by mouth daily. 08/26/23   Arellano Zameza, Priscila, MD    Allergies: Patient has no known allergies.    Review of Systems  Updated Vital Signs BP (!) 128/95   Pulse (!) 110   Temp 98.1 F (36.7 C)   Resp 16   SpO2 99%   Physical Exam Vitals and nursing note reviewed.  Constitutional:      General: He is not in acute distress.    Appearance: He is well-developed.  HENT:      Head: Normocephalic and atraumatic.   Eyes:     Conjunctiva/sclera: Conjunctivae normal.    Cardiovascular:     Rate and Rhythm: Regular rhythm. Tachycardia present.  Pulmonary:     Effort: Pulmonary effort is normal. No respiratory distress.     Breath sounds: No stridor.  Abdominal:     General: There is no distension.   Musculoskeletal:     Right lower leg: Edema present.     Left lower leg: Edema present.   Skin:    General: Skin is warm and dry.   Neurological:     Mental Status: He is alert and oriented to person, place, and time.     (all labs ordered are listed, but only abnormal results are displayed) Labs Reviewed  BASIC METABOLIC PANEL WITH GFR - Abnormal; Notable for the following components:      Result Value   Glucose, Bld 122 (*)    BUN 70 (*)    Creatinine, Ser 3.29 (*)    Calcium  8.7 (*)    GFR, Estimated 23 (*)    All other components within normal limits  DIGOXIN  LEVEL - Abnormal; Notable for the following components:   Digoxin  Level <0.4 (*)    All other components within normal limits  LIPASE, BLOOD - Abnormal; Notable for the following components:   Lipase 56 (*)    All  other components within normal limits  HEPATIC FUNCTION PANEL - Abnormal; Notable for the following components:   Total Protein 6.3 (*)    Albumin 2.8 (*)    Bilirubin, Direct 0.3 (*)    All other components within normal limits  TROPONIN I (HIGH SENSITIVITY) - Abnormal; Notable for the following components:   Troponin I (High Sensitivity) 40 (*)    All other components within normal limits  CBC  BRAIN NATRIURETIC PEPTIDE  TROPONIN I (HIGH SENSITIVITY)    EKG: EKG Interpretation Date/Time:  Sunday March 09 2024 18:35:23 EDT Ventricular Rate:  109 PR Interval:  121 QRS Duration:  124 QT Interval:  363 QTC Calculation: 489 R Axis:   199  Text Interpretation: Sinus tachycardia Probable left atrial enlargement Consider left ventricular hypertrophy Lateral infarct,  old Confirmed by Garrick Charleston [phone]) on 03/09/2024 7:21:01 PM  Radiology: DG Chest 2 View Result Date: 03/09/2024 CLINICAL DATA:  cp EXAM: CHEST - 2 VIEW COMPARISON:  August 24, 2023 FINDINGS: Bilateral perihilar interstitial opacities with patchy airspace opacities in the both lung bases. No pleural effusion or pneumothorax. Severe cardiomegaly.No acute fracture or destructive lesion. Multilevel thoracic osteophytosis. IMPRESSION: Cardiomegaly with findings of pulmonary edema. Electronically Signed   By: Rogelia Myers M.D.   On: 03/09/2024 19:26     Procedures   Medications Ordered in the ED - No data to display                                  Medical Decision Making Adult male with history of CHF hypertension, diabetes, now presents with chest pain, lower extremity swelling.  Broad differential including ACS, heart failure, renal dysfunction, bacteremia, sepsis, though the patient is afebrile.  He is tachycardic, and with chest pain, pneumonia is a consideration. Patient received labs monitoring. Cardiac 110 sinus tach abnormal pulse ox 99% room air normal.  Amount and/or Complexity of Data Reviewed External Data Reviewed: notes. Labs: ordered. Decision-making details documented in ED Course. Radiology: ordered and independent interpretation performed. Decision-making details documented in ED Course. ECG/medicine tests: ordered and independent interpretation performed. Decision-making details documented in ED Course.  Risk Prescription drug management. Decision regarding hospitalization. Diagnosis or treatment significantly limited by social determinants of health.  Echo: December/24 1. Left ventricular ejection fraction, by estimation, is 20 to 25%. The  left ventricle has severely decreased function. The left ventricle  demonstrates global hypokinesis. The left ventricular internal cavity size  was moderately dilated. There is  moderate concentric left ventricular  hypertrophy. Indeterminate diastolic  filling due to E-A fusion.   2. Right ventricular systolic function is mildly reduced. The right  ventricular size is mildly enlarged. Tricuspid regurgitation signal is  inadequate for assessing PA pressure.   3. Left atrial size was moderately dilated.   4. Right atrial size was mildly dilated.   5. A small pericardial effusion is present. The pericardial effusion is  circumferential.   6. The mitral valve is normal in structure. Mild mitral valve  regurgitation. No evidence of mitral stenosis.   7. The aortic valve is normal in structure. Aortic valve regurgitation is  not visualized. No aortic stenosis is present.   8. The inferior vena cava is normal in size with greater than 50%  respiratory variability, suggesting right atrial pressure of 3 mmHg.   9. Cannot exclude a small PFO.   9:56 PM Patient with digoxin  subtherapeutic, mild elevation in lipase, but  concerning the patient with new renal dysfunction.  In the context of worsening heart failure with bilateral pulmonary congestion, known history of CHF, last EF 20, 25% in December of last year and a BNP greater than 2000 tonight patient will require admission.  Given his fluid overload status, patient has received Lasix , he is on 2 L nasal cannula, with this, he has improved symptomatically.  CRITICAL CARE Performed by: Lamar Salen Total critical care time: 35 minutes Critical care time was exclusive of separately billable procedures and treating other patients. Critical care was necessary to treat or prevent imminent or life-threatening deterioration. Critical care was time spent personally by me on the following activities: development of treatment plan with patient and/or surrogate as well as nursing, discussions with consultants, evaluation of patient's response to treatment, examination of patient, obtaining history from patient or surrogate, ordering and performing treatments and  interventions, ordering and review of laboratory studies, ordering and review of radiographic studies, pulse oximetry and re-evaluation of patient's condition.]   Final diagnoses:  Acute on chronic systolic congestive heart failure (HCC)  AKI (acute kidney injury) (HCC)     Salen Lamar, MD 03/09/24 2205

## 2024-03-09 NOTE — ED Notes (Signed)
 Patient transported to X-ray

## 2024-03-09 NOTE — ED Triage Notes (Signed)
 Pt here from home with c/o chest pain and sob , on O2 2 liters at  home 324 mg asa given by ems

## 2024-03-09 NOTE — Hospital Course (Addendum)
#  Acute hypoxic respiratory failure due to combined systolic and diastolic heart failure exacerbation, low output state  He presented with shortness of breath and bilateral lower extremity swelling, consistent with heart failure exacerbation in the setting of unclear home medication use. He was admitted with a weight of 111 kgs which was decreased to 102.1 kgs on the day of discharge after administration of diuretics for several days. Cardiology was consulted. His oxygen requirements decreased rapidly with diuresis. New echo on 7/1 showed an EF of 20-25%, worsening right ventricular systolic function and worsening mitral insufficiency. He had cool extremities and a co-oximetry of 43.9 on 7/1 and was started on Milrinone  for low output heart failure. He underwent cardioversion for a-flutter on 7/8 and was able to wean from Milrinone  over the next couple of days while co-oximetry remained stable between 60 and 65%. He was euvolemic for several days before discharge. He was started on Torsemide  80mg  qAM/ 40mg  qPM for a maintenance dose with cardiology. At discharge, he was not requiring any oxygen with ambulation and was able to maintain saturations at 95%   #Paroxysmal A-fib #2:1 Atrial flutter #Tachycardia  Recently started warfarin before admission, had previously been taking Xarelto  due to daily dosing and no repeated checks. INR greater than 10 on admission, Given Vitamin K  for anticoagulation reversal x2.  CHA2DS2-VASc of 3. New a-flutter seen on telemetry on 7/3. Underwent cardioversion with heart failure team with conversion to sinus rhythm on 7/7 with improvement of his tachycardia. He was restarted on Xarelto  15mg  with a one year manufacturer assistance program. He was started on an amiodarone  drip and this was changed to PO on 7/8.   #AKI on CKD stage IIIa His creatinine increased to 3.29 on admission from baseline of 1.6 in December 2024. Renal U/S showed echogenic kidneys compatible with chronic  medical renal disease, new or progressed since 2023. No hydronephrosis or acute renal finding. Likely a cardiorenal AKI as it improved with diuresis to 2.47 on 7/2. At that time his creatinine started to increase and his diuretics were stopped. After kidney function improved diuresis restarted on 7/6. Creatinine on discharge 2.69. Discharged on Torsemide  80mg  qAM/ 40mg  qPM   #T2DM Was on glimeperide 2mg  prior to admission. A1c 10.3 this admission. Moderate sliding scale insulin  used during admission. Will restart Glimeperide 2mg  on discharge   #HTN BP remained relatively well controlled since admission. BB were held in acute exacerbation and his anti-hypertensives were titrated to hydralazine  to 100mg  TID and Imdur  to 90mg  once daily    #Anxiety #Insomnia -Melatonin 3mg  at bed time -PRN Hydroxyzine  25mg  TID for anxiety

## 2024-03-10 ENCOUNTER — Observation Stay (HOSPITAL_COMMUNITY): Payer: Self-pay

## 2024-03-10 ENCOUNTER — Other Ambulatory Visit: Payer: Self-pay

## 2024-03-10 ENCOUNTER — Other Ambulatory Visit (HOSPITAL_COMMUNITY): Payer: Self-pay

## 2024-03-10 DIAGNOSIS — I130 Hypertensive heart and chronic kidney disease with heart failure and stage 1 through stage 4 chronic kidney disease, or unspecified chronic kidney disease: Principal | ICD-10-CM

## 2024-03-10 DIAGNOSIS — N1831 Chronic kidney disease, stage 3a: Secondary | ICD-10-CM

## 2024-03-10 DIAGNOSIS — I5043 Acute on chronic combined systolic (congestive) and diastolic (congestive) heart failure: Secondary | ICD-10-CM

## 2024-03-10 DIAGNOSIS — E1122 Type 2 diabetes mellitus with diabetic chronic kidney disease: Secondary | ICD-10-CM

## 2024-03-10 DIAGNOSIS — Z7984 Long term (current) use of oral hypoglycemic drugs: Secondary | ICD-10-CM

## 2024-03-10 LAB — GLUCOSE, CAPILLARY
Glucose-Capillary: 90 mg/dL (ref 70–99)
Glucose-Capillary: 172 mg/dL — ABNORMAL HIGH (ref 70–99)

## 2024-03-10 LAB — RAPID URINE DRUG SCREEN, HOSP PERFORMED
Amphetamines: NOT DETECTED
Barbiturates: NOT DETECTED
Benzodiazepines: NOT DETECTED
Cocaine: NOT DETECTED
Opiates: NOT DETECTED
Tetrahydrocannabinol: NOT DETECTED

## 2024-03-10 LAB — URINALYSIS, ROUTINE W REFLEX MICROSCOPIC
Bilirubin Urine: NEGATIVE
Glucose, UA: NEGATIVE mg/dL
Hgb urine dipstick: NEGATIVE
Ketones, ur: NEGATIVE mg/dL
Leukocytes,Ua: NEGATIVE
Nitrite: NEGATIVE
Protein, ur: 100 mg/dL — AB
Specific Gravity, Urine: 1.011 (ref 1.005–1.030)
pH: 5 (ref 5.0–8.0)

## 2024-03-10 LAB — BASIC METABOLIC PANEL WITH GFR
Anion gap: 11 (ref 5–15)
BUN: 67 mg/dL — ABNORMAL HIGH (ref 6–20)
CO2: 27 mmol/L (ref 22–32)
Calcium: 8.5 mg/dL — ABNORMAL LOW (ref 8.9–10.3)
Chloride: 104 mmol/L (ref 98–111)
Creatinine, Ser: 3.18 mg/dL — ABNORMAL HIGH (ref 0.61–1.24)
GFR, Estimated: 24 mL/min — ABNORMAL LOW (ref >60)
Glucose, Bld: 122 mg/dL — ABNORMAL HIGH (ref 70–99)
Potassium: 3.7 mmol/L (ref 3.5–5.1)
Sodium: 142 mmol/L (ref 135–145)

## 2024-03-10 LAB — HEPARIN LEVEL (UNFRACTIONATED): Heparin Unfractionated: <0.1 [IU]/mL — ABNORMAL LOW (ref 0.30–0.70)

## 2024-03-10 LAB — PROTIME-INR
INR: >10 (ref 0.8–1.2)
INR: >10 (ref 0.8–1.2)
Prothrombin Time: 85.2 s — ABNORMAL HIGH (ref 11.4–15.2)
Prothrombin Time: 85.3 s — ABNORMAL HIGH (ref 11.4–15.2)

## 2024-03-10 LAB — LIPID PANEL
Cholesterol: 73 mg/dL (ref 0–200)
HDL: 24 mg/dL — ABNORMAL LOW (ref >40)
LDL Cholesterol: 30 mg/dL (ref 0–99)
Total CHOL/HDL Ratio: 3 ratio
Triglycerides: 93 mg/dL (ref <150)
VLDL: 19 mg/dL (ref 0–40)

## 2024-03-10 LAB — HEMOGLOBIN A1C
Hgb A1c MFr Bld: 10.3 % — ABNORMAL HIGH (ref 4.8–5.6)
Mean Plasma Glucose: 248.91 mg/dL

## 2024-03-10 LAB — CBG MONITORING, ED
Glucose-Capillary: 124 mg/dL — ABNORMAL HIGH (ref 70–99)
Glucose-Capillary: 129 mg/dL — ABNORMAL HIGH (ref 70–99)

## 2024-03-10 MED ORDER — PHYTONADIONE 5 MG PO TABS
2.5000 mg | ORAL_TABLET | Freq: Once | ORAL | Status: AC
  Administered 2024-03-10: 2.5 mg via ORAL
  Filled 2024-03-10: qty 1

## 2024-03-10 MED ORDER — INSULIN ASPART 100 UNIT/ML IJ SOLN
0.0000 [IU] | Freq: Three times a day (TID) | INTRAMUSCULAR | Status: DC
  Administered 2024-03-10 – 2024-03-11 (×4): 2 [IU] via SUBCUTANEOUS
  Administered 2024-03-11: 3 [IU] via SUBCUTANEOUS
  Administered 2024-03-12 (×2): 2 [IU] via SUBCUTANEOUS
  Administered 2024-03-13 – 2024-03-14 (×5): 3 [IU] via SUBCUTANEOUS
  Administered 2024-03-14 – 2024-03-15 (×2): 2 [IU] via SUBCUTANEOUS
  Administered 2024-03-15 – 2024-03-16 (×3): 3 [IU] via SUBCUTANEOUS
  Administered 2024-03-16 (×2): 2 [IU] via SUBCUTANEOUS
  Administered 2024-03-17: 3 [IU] via SUBCUTANEOUS
  Administered 2024-03-17 – 2024-03-18 (×2): 2 [IU] via SUBCUTANEOUS
  Administered 2024-03-19: 3 [IU] via SUBCUTANEOUS

## 2024-03-10 MED ORDER — PHYTONADIONE 5 MG PO TABS: 2.5000 mg | ORAL_TABLET | Freq: Once | ORAL | Status: DC

## 2024-03-10 MED ORDER — FUROSEMIDE 10 MG/ML IJ SOLN
80.0000 mg | Freq: Two times a day (BID) | INTRAMUSCULAR | Status: DC
  Administered 2024-03-10 – 2024-03-11 (×3): 80 mg via INTRAVENOUS
  Filled 2024-03-10 (×3): qty 8

## 2024-03-10 MED ORDER — DIGOXIN 125 MCG PO TABS
0.0625 mg | ORAL_TABLET | Freq: Every day | ORAL | Status: DC
  Administered 2024-03-11 – 2024-03-12 (×2): 0.0625 mg via ORAL
  Filled 2024-03-10 (×2): qty 1

## 2024-03-10 MED ORDER — ORAL CARE MOUTH RINSE: 15.0000 mL | OROMUCOSAL | Status: DC | PRN

## 2024-03-10 NOTE — ED Notes (Signed)
 Pt ambulated to the restroom.

## 2024-03-10 NOTE — ED Notes (Signed)
 INR > 10 per lab; admitting doctor paged by secretary

## 2024-03-10 NOTE — Progress Notes (Signed)
 Pt mews yellow , hr 110s pt asymptomatic , provider aware . No new interventions or orders at this time , will continue to monitor.   03/10/24 2019  Assess: MEWS Score  Temp 97.9 F (36.6 C)  BP (!) 142/98  MAP (mmHg) 112  Pulse Rate (!) 111  ECG Heart Rate (!) 112  Resp 19  Level of Consciousness Alert  SpO2 98 %  O2 Device Nasal Cannula  Assess: MEWS Score  MEWS Temp 0  MEWS Systolic 0  MEWS Pulse 2  MEWS RR 0  MEWS LOC 0  MEWS Score 2  MEWS Score Color Yellow  Assess: if the MEWS score is Yellow or Red  Were vital signs accurate and taken at a resting state? Yes  Does the patient meet 2 or more of the SIRS criteria? No  MEWS guidelines implemented  Yes, yellow  Treat  MEWS Interventions Considered administering scheduled or prn medications/treatments as ordered  Take Vital Signs  Increase Vital Sign Frequency  Yellow: Q2hr x1, continue Q4hrs until patient remains green for 12hrs  Escalate  MEWS: Escalate Yellow: Discuss with charge nurse and consider notifying provider and/or RRT  Notify: Charge Nurse/RN  Name of Charge Nurse/RN Notified Production manager  Provider Notification  Provider Name/Title Jolaine. J DO  Date Provider Notified 03/10/24  Time Provider Notified 2030  Method of Notification Page  Notification Reason Other (Comment) (Mews yellow due to HR 110-113 pt asymptomatic)  Provider response Evaluate remotely (provider aware no new interventions or orders at this time.)  Date of Provider Response 03/10/24  Time of Provider Response 2031  Notify: Rapid Response  Name of Rapid Response RN Notified David, RN  Date Rapid Response Notified 03/10/24  Time Rapid Response Notified 2034  Assess: SIRS CRITERIA  SIRS Temperature  0  SIRS Respirations  0  SIRS Pulse 1  SIRS WBC 0  SIRS Score Sum  1

## 2024-03-10 NOTE — Progress Notes (Signed)
 Heart Failure Navigator Progress Note  Assessed for Heart & Vascular TOC clinic readiness.  Patient does not meet criteria due to Advanced Heart Failure Team patient. Of Dr. Rolan. .   Navigator will signoff at this time.   Stephane Haddock, BSN, Scientist, clinical (histocompatibility and immunogenetics) Only

## 2024-03-10 NOTE — Plan of Care (Signed)

## 2024-03-10 NOTE — ED Notes (Signed)
 Pt provided with food and water

## 2024-03-10 NOTE — ED Notes (Signed)
 Checked patient cbg it was 129 patient is resting with call bell in reach

## 2024-03-10 NOTE — Progress Notes (Signed)
 PHARMACY - ANTICOAGULATION CONSULT NOTE  Pharmacy Consult for Xarelto   Indication: atrial fibrillation  No Known Allergies  Vital Signs: Temp: 98.2 F (36.8 C) (06/29 2230) Temp Source: Oral (06/29 2230) BP: 118/75 (06/30 0245) Pulse Rate: 109 (06/30 0245)  Labs: Recent Labs    03/09/24 1842 03/09/24 2059 03/10/24 0042 03/10/24 0222  HGB 14.5  --   --   --   HCT 47.4  --   --   --   PLT 269  --   --   --   LABPROT  --   --  85.2* 85.3*  INR  --   --  >10.0* >10.0*  HEPARINUNFRC  --   --   --  <0.10*  CREATININE 3.29*  --   --  3.18*  TROPONINIHS 40* 44*  --   --     CrCl cannot be calculated (Unknown ideal weight.).   Medical History: Past Medical History:  Diagnosis Date   Heart failure with reduced ejection fraction [facility]) 2017   [facility]   Hypertension 2017   [facility]   Assessment: 43 y/o M presents to the ED with chest pain and shortness of breath. Found to be in acute renal failure, hsTrop stable (40>44). When the patient was here in December 2024 he was discharged on Xarelto  for atrial fibrillation.   He has been working in [place]  for the last few months and it appears at some point while he was in [place]  he was switched to Warfarin. Pt is a poor historian and MD was unable to corroborate this, but refill history and labs would suggest this. Baseline anti-Xa level is undetectable which means patient likely not taking Xarelto . INR of >10 (verified by re-check) in setting of undetectable heparin  level likely confirms patient is taking warfarin. Pt also shows fill history of Amiodarone, the INR of >10 is likely to be from drug-drug-interaction with Warfarin.   This has been discussed with the internal medicine team. We will proceed with plan that patient has been taking warfarin and we will plan to transition back to Xarelto .   Goal of Therapy:  Monitor platelets by anticoagulation protocol: Yes   Plan:  Daily PT/INR Re-start Xarelto  once  INR is <3  Lynwood Mckusick, PharmD, BCPS Clinical Pharmacist Phone: [phone]

## 2024-03-10 NOTE — Progress Notes (Addendum)
 HD#0 SUBJECTIVE:  Patient Summary: Justin [name] is a 43 y.o. male who was assigned male at birth with a pertinent PMH of HFrEF (last EF 20-25% 12/24), paroxysmal a-fib on anticoagulation, T2DM, HTN who presented with dyspnea, BLE edema and orthopnea and admitted for heart failure exacerbation.   He moved recently from [place]  and has had multiple changes to his medications, including started bumetanide and warfarin. He had had decreased urine output and edema over the past week and developed orthopnea.  In the ED he was hypoxic and hypertensive. His labs showed an increase of creatinine to 3.29 from 1.6. CXR showed pulmonary congestion  Overnight Events: None  Interim History: Justin [name] is feeling better this morning and has filled his urinal several times since receiving Lasix  last night. He was able to speak with me this morning without any desaturations while on room air. He notes that the swelling in his feet has improved subjectively and that he is not having difficulty breathing. Per chart review, his home O2 is 1.5L at rest and 3.0L with ambulation following hospitalization in July 2024. He states that he only uses O2 at home if he feels that he needs it.  OBJECTIVE:  Vital Signs: Vitals:   03/10/24 0730 03/10/24 0745 03/10/24 0800 03/10/24 0815  BP: (!) 131/97 (!) 123/105 112/60 (!) 144/94  Pulse: (!) 110 (!) 109 (!) 113 (!) 113  Resp: 16 (!) 24    Temp:      TempSrc:      SpO2: 100% 98% 95% 97%   Supplemental O2: Nasal Cannula SpO2: 97 % O2 Flow Rate (L/min): 1.5 L/min  There were no vitals filed for this visit.   Intake/Output Summary (Last 24 hours) at 03/10/2024 1040 Last data filed at 03/10/2024 0651 Gross per 24 hour  Intake --  Output 1400 ml  Net -1400 ml   Net IO Since Admission: -1,400 mL [03/10/24 1040]  Physical Exam: Physical Exam Constitutional:      General: He is not in acute distress.    Appearance: He is not  ill-appearing.   Cardiovascular:     Rate and Rhythm: Normal rate and regular rhythm.     Pulses:          Dorsalis pedis pulses are 2+ on the right side and 2+ on the left side.     Heart sounds: No murmur heard.    No friction rub. No gallop.  Pulmonary:     Effort: No respiratory distress.     Comments: Crackles in bilateral lung bases   Musculoskeletal:     Right lower leg: Edema present.     Left lower leg: Edema present.     Comments: 2+ pitting edema of bilateral lower extremities, extending to the mid shin    Skin:    General: Skin is warm.   Neurological:     Mental Status: He is alert.     Patient Lines/Drains/Airways Status     Active Line/Drains/Airways     Name Placement date Placement time Site Days   Peripheral IV 03/09/24 20 G Anterior;Distal;Right;Upper Arm 03/09/24  1842  Arm  1            Pertinent labs and imaging:     Latest Ref Rng & Units 03/09/2024    6:42 PM 08/25/2023    5:15 AM 08/24/2023    3:54 AM  CBC  WBC 4.0 - 10.5 K/uL 7.0  6.8  8.6   Hemoglobin  13.0 - 17.0 g/dL 85.4  83.6  83.4   Hematocrit 39.0 - 52.0 % 47.4  48.5  49.1   Platelets 150 - 400 K/uL 269  250  284        Latest Ref Rng & Units 03/10/2024    2:22 AM 03/09/2024    8:59 PM 03/09/2024    6:42 PM  CMP  Glucose 70 - 99 mg/dL 877   877   BUN 6 - 20 mg/dL 67   70   Creatinine 9.38 - 1.24 mg/dL 6.81   6.70   Sodium 864 - 145 mmol/L 142   141   Potassium 3.5 - 5.1 mmol/L 3.7   3.9   Chloride 98 - 111 mmol/L 104   104   CO2 22 - 32 mmol/L 27   24   Calcium  8.9 - 10.3 mg/dL 8.5   8.7   Total Protein 6.5 - 8.1 g/dL  6.3    Total Bilirubin 0.0 - 1.2 mg/dL  1.0    Alkaline Phos 38 - 126 U/L  89    AST 15 - 41 U/L  36    ALT 0 - 44 U/L  30      US  RENAL Result Date: 03/10/2024 CLINICAL DATA:  43 year old male with acute renal insufficiency. EXAM: RENAL / URINARY TRACT ULTRASOUND COMPLETE COMPARISON:  Noncontrast chest CT 02/17/2022. Right upper quadrant ultrasound  02/09/2022. FINDINGS: Right Kidney: Renal measurements: 11.7 x 6.0 x 6.1 cm = volume: 222 mL. Abnormally increased renal cortical echogenicity since the previous ultrasound (image 3). No right hydronephrosis or renal mass. Left Kidney: Renal measurements: 12.8 x 6.2 x 6.5 cm = volume: 268 mL. Similar abnormal increased left renal cortical echogenicity (image 28). No left hydronephrosis or renal mass. Bladder: Appears normal for degree of bladder distention. Other: None. IMPRESSION: Echogenic kidneys compatible with chronic medical renal disease, new or progressed since 2023. No hydronephrosis or acute renal finding. Electronically Signed   By: VEAR Hurst M.D.   On: 03/10/2024 04:10   DG Chest 2 View Result Date: 03/09/2024 CLINICAL DATA:  cp EXAM: CHEST - 2 VIEW COMPARISON:  August 24, 2023 FINDINGS: Bilateral perihilar interstitial opacities with patchy airspace opacities in the both lung bases. No pleural effusion or pneumothorax. Severe cardiomegaly.No acute fracture or destructive lesion. Multilevel thoracic osteophytosis. IMPRESSION: Cardiomegaly with findings of pulmonary edema. Electronically Signed   By: Rogelia Myers M.D.   On: 03/09/2024 19:26    ASSESSMENT/PLAN:  Assessment: Principal Problem:   Acute hypoxic respiratory failure (HCC)  Justin [name] is a 43 y.o. adult with pertinent PMH of HFrEF (20-25% 12/24), paroxysmal A-fib on anticoagulation, T2DM (A1C 10.3), and hypertension who presented with progressive dyspnea on exertion and lower extremity edema and is admitted for acute hypoxic respiratory failure due to heart failure exacerbation.   Plan: #Acute hypoxic respiratory failure due to combined systolic and diastolic heart failure exacerbation Presented with shortness of breath and bilateral lower extremity swelling. Previous echo on 08/2023 showed LVEF 20 to 25% with severely decreased LV function, global hypokinesis, moderate LV dilation, concentric LVH, mildly  reduced RV function, mildly enlarged RV, biatrial dilation, and possible small PFO. He received 40mg  IV of Lasix  followed by a second dose of 80mg  IV with adequate urine output. Edema and oxygen requirements decreasing today. He was previously on home O2 1.5L at rest and 3.0L with exertion. -daily weights -holding BB for now -atorvastatin 20mg  [] Lasix  80mg  IV BID with strict ins and outs []   F/u TTE [] Digoxin  .9374 given his decreased GFR [] wean O2 as tolerated  #AKI on CKD stage IIIa -Creatinine increased to 3.29 on admission from baseline of 1.6 in December 2024 -differential includes cardiorenal AKI vs progression of CKD causing diuretic resistance -able to make urine -renal U/S showed echogenic kidneys compatible with chronic medical renal disease, new or progressed since 2023. No hydronephrosis or acute renal finding. -may improve with diuresis [] strict ins and outs  #Paroxysmal A-fib -recently started warfarin instead of Xarelto , had been taking Xarelto  due to daily dosing and no repeated checks -INR greater than 10 on admission -heparin  levels low -given Vitamin K for anticoagulation reversal [] daily PT/INR  #T2DM -Started on glimeperide 2mg  prior to admission -A1c 10.3 this admission -glucose 122 6/30 [] Moderate SSI  #HTN -hypertensive to 144/94 6/30 [] holding BB for now with acute exacerbation, waiting for additional GDMT given kidney function, continue diuresis  Best Practice: Diet: Cardiac diet VTE: Holding due to high INR Code: Full  Disposition planning: Therapy Recs: Pending,  DISPO: Anticipated discharge tomorrow to Home pending volume status exam Signature:  Alizee Maple [facility]  10:40 AM, 03/10/2024  On Call pager [phone]

## 2024-03-11 ENCOUNTER — Other Ambulatory Visit (HOSPITAL_COMMUNITY): Payer: Self-pay

## 2024-03-11 ENCOUNTER — Other Ambulatory Visit: Payer: Self-pay

## 2024-03-11 ENCOUNTER — Inpatient Hospital Stay (HOSPITAL_COMMUNITY): Payer: Self-pay

## 2024-03-11 DIAGNOSIS — F419 Anxiety disorder, unspecified: Secondary | ICD-10-CM

## 2024-03-11 DIAGNOSIS — R0609 Other forms of dyspnea: Secondary | ICD-10-CM

## 2024-03-11 DIAGNOSIS — J9601 Acute respiratory failure with hypoxia: Secondary | ICD-10-CM

## 2024-03-11 DIAGNOSIS — Z794 Long term (current) use of insulin: Secondary | ICD-10-CM

## 2024-03-11 LAB — BASIC METABOLIC PANEL WITH GFR
Anion gap: 8 (ref 5–15)
Anion gap: 12 (ref 5–15)
BUN: 53 mg/dL — ABNORMAL HIGH (ref 6–20)
BUN: 56 mg/dL — ABNORMAL HIGH (ref 6–20)
CO2: 28 mmol/L (ref 22–32)
CO2: 29 mmol/L (ref 22–32)
Calcium: 8.5 mg/dL — ABNORMAL LOW (ref 8.9–10.3)
Calcium: 8.6 mg/dL — ABNORMAL LOW (ref 8.9–10.3)
Chloride: 103 mmol/L (ref 98–111)
Chloride: 100 mmol/L (ref 98–111)
Creatinine, Ser: 2.66 mg/dL — ABNORMAL HIGH (ref 0.61–1.24)
Creatinine, Ser: 2.59 mg/dL — ABNORMAL HIGH (ref 0.61–1.24)
GFR, Estimated: 29 mL/min — ABNORMAL LOW (ref >60)
GFR, Estimated: 30 mL/min — ABNORMAL LOW (ref >60)
Glucose, Bld: 107 mg/dL — ABNORMAL HIGH (ref 70–99)
Glucose, Bld: 135 mg/dL — ABNORMAL HIGH (ref 70–99)
Potassium: 3.6 mmol/L (ref 3.5–5.1)
Potassium: 4.5 mmol/L (ref 3.5–5.1)
Sodium: 139 mmol/L (ref 135–145)
Sodium: 141 mmol/L (ref 135–145)

## 2024-03-11 LAB — CBC
HCT: 46.8 % (ref 39.0–52.0)
Hemoglobin: 14.5 g/dL (ref 13.0–17.0)
MCH: 27.5 pg (ref 26.0–34.0)
MCHC: 31 g/dL (ref 30.0–36.0)
MCV: 88.8 fL (ref 80.0–100.0)
Platelets: 216 10*3/uL (ref 150–400)
RBC: 5.27 MIL/uL (ref 4.22–5.81)
RDW: 15.2 % (ref 11.5–15.5)
WBC: 6.3 10*3/uL (ref 4.0–10.5)
nRBC: 0 % (ref 0.0–0.2)

## 2024-03-11 LAB — GLUCOSE, CAPILLARY
Glucose-Capillary: 135 mg/dL — ABNORMAL HIGH (ref 70–99)
Glucose-Capillary: 143 mg/dL — ABNORMAL HIGH (ref 70–99)
Glucose-Capillary: 164 mg/dL — ABNORMAL HIGH (ref 70–99)
Glucose-Capillary: 158 mg/dL — ABNORMAL HIGH (ref 70–99)
Glucose-Capillary: 146 mg/dL — ABNORMAL HIGH (ref 70–99)

## 2024-03-11 LAB — ECHOCARDIOGRAM COMPLETE
Calc EF: 22.2 %
Height: 68 in
S' Lateral: 5 cm
Single Plane A2C EF: 8.3 %
Single Plane A4C EF: 32.5 %
Weight: 3922.42 [oz_av]

## 2024-03-11 LAB — COOXEMETRY PANEL
Carboxyhemoglobin: 1.6 % — ABNORMAL HIGH (ref 0.5–1.5)
Methemoglobin: 0.7 % (ref 0.0–1.5)
O2 Saturation: 43.9 %
Total hemoglobin: 14.7 g/dL (ref 12.0–16.0)

## 2024-03-11 LAB — PROTIME-INR
INR: 6.4 (ref 0.8–1.2)
INR: 2.8 — ABNORMAL HIGH (ref 0.8–1.2)
Prothrombin Time: 58.8 s — ABNORMAL HIGH (ref 11.4–15.2)
Prothrombin Time: 31.1 s — ABNORMAL HIGH (ref 11.4–15.2)

## 2024-03-11 LAB — MAGNESIUM: Magnesium: 2 mg/dL (ref 1.7–2.4)

## 2024-03-11 LAB — LACTIC ACID, PLASMA: Lactic Acid, Venous: 1.8 mmol/L (ref 0.5–1.9)

## 2024-03-11 MED ORDER — FUROSEMIDE 10 MG/ML IJ SOLN
120.0000 mg | Freq: Two times a day (BID) | INTRAVENOUS | Status: DC
  Administered 2024-03-11 – 2024-03-12 (×3): 120 mg via INTRAVENOUS
  Filled 2024-03-11: qty 10
  Filled 2024-03-11: qty 12
  Filled 2024-03-11: qty 10
  Filled 2024-03-11: qty 12
  Filled 2024-03-11: qty 10

## 2024-03-11 MED ORDER — SODIUM CHLORIDE 0.9% FLUSH
10.0000 mL | Freq: Two times a day (BID) | INTRAVENOUS | Status: DC
  Administered 2024-03-11 – 2024-03-19 (×12): 10 mL

## 2024-03-11 MED ORDER — POTASSIUM CHLORIDE CRYS ER 20 MEQ PO TBCR
40.0000 meq | EXTENDED_RELEASE_TABLET | Freq: Once | ORAL | Status: AC
  Administered 2024-03-11: 40 meq via ORAL
  Filled 2024-03-11: qty 2

## 2024-03-11 MED ORDER — PERFLUTREN LIPID MICROSPHERE
1.0000 mL | INTRAVENOUS | Status: AC | PRN
  Administered 2024-03-11: 1 mL via INTRAVENOUS

## 2024-03-11 MED ORDER — HYDROXYZINE HCL 25 MG PO TABS
25.0000 mg | ORAL_TABLET | Freq: Three times a day (TID) | ORAL | Status: DC | PRN
  Administered 2024-03-18: 25 mg via ORAL
  Filled 2024-03-11 (×2): qty 1

## 2024-03-11 MED ORDER — AMIODARONE HCL 200 MG PO TABS
200.0000 mg | ORAL_TABLET | Freq: Every day | ORAL | Status: DC
  Administered 2024-03-11: 200 mg via ORAL
  Filled 2024-03-11 (×2): qty 1

## 2024-03-11 MED ORDER — METOLAZONE 5 MG PO TABS
5.0000 mg | ORAL_TABLET | Freq: Once | ORAL | Status: AC
  Administered 2024-03-11: 5 mg via ORAL
  Filled 2024-03-11: qty 1

## 2024-03-11 MED ORDER — CHLORHEXIDINE GLUCONATE CLOTH 2 % EX PADS
6.0000 | MEDICATED_PAD | Freq: Every day | CUTANEOUS | Status: DC
  Administered 2024-03-12 – 2024-03-19 (×8): 6 via TOPICAL

## 2024-03-11 MED ORDER — SODIUM CHLORIDE 0.9% FLUSH: 10.0000 mL | INTRAVENOUS | Status: DC | PRN

## 2024-03-11 MED ORDER — VITAMIN K1 10 MG/ML IJ SOLN
1.0000 mg | Freq: Once | INTRAVENOUS | Status: AC
  Administered 2024-03-11: 1 mg via INTRAVENOUS
  Filled 2024-03-11: qty 0.1

## 2024-03-11 MED ORDER — RIVAROXABAN 15 MG PO TABS
15.0000 mg | ORAL_TABLET | Freq: Every day | ORAL | Status: DC
  Administered 2024-03-11: 15 mg via ORAL
  Filled 2024-03-11: qty 1

## 2024-03-11 MED ORDER — MILRINONE LACTATE IN DEXTROSE 20-5 MG/100ML-% IV SOLN
0.1250 ug/kg/min | INTRAVENOUS | Status: DC
  Administered 2024-03-11 – 2024-03-12 (×3): 0.25 ug/kg/min via INTRAVENOUS
  Administered 2024-03-13: 0.125 ug/kg/min via INTRAVENOUS
  Administered 2024-03-15 – 2024-03-16 (×3): 0.25 ug/kg/min via INTRAVENOUS
  Administered 2024-03-17: 0.125 ug/kg/min via INTRAVENOUS
  Filled 2024-03-11 (×9): qty 100

## 2024-03-11 MED ORDER — RIVAROXABAN 20 MG PO TABS
20.0000 mg | ORAL_TABLET | Freq: Every day | ORAL | Status: DC
  Administered 2024-03-12: 20 mg via ORAL
  Filled 2024-03-11: qty 1

## 2024-03-11 MED ORDER — MELATONIN 3 MG PO TABS
3.0000 mg | ORAL_TABLET | Freq: Every day | ORAL | Status: DC
  Administered 2024-03-11 – 2024-03-18 (×8): 3 mg via ORAL
  Filled 2024-03-11 (×8): qty 1

## 2024-03-11 NOTE — Progress Notes (Addendum)
 PHARMACY - ANTICOAGULATION CONSULT NOTE  Pharmacy Consult for rivaroxaban /Xarelto  Indication: atrial fibrillation  No Known Allergies  Patient Measurements: Height: 5' 8 (172.7 cm) Weight: 111.2 kg (245 lb 2.4 oz) IBW/kg (Calculated) : 68.4 HEPARIN  DW (KG): 93.8  Vital Signs: Temp: 98 F (36.7 C) (07/01 2009) Temp Source: Oral (07/01 2009) BP: 126/102 (07/01 2009) Pulse Rate: 113 (07/01 2009)  Labs: Recent Labs    03/09/24 1842 03/09/24 2059 03/10/24 0042 03/10/24 0222 03/11/24 0251 03/11/24 1935  HGB 14.5  --   --   --  14.5  --   HCT 47.4  --   --   --  46.8  --   PLT 269  --   --   --  216  --   LABPROT  --   --    < > 85.3* 58.8* 31.1*  INR  --   --    < > >10.0* 6.4* 2.8*  HEPARINUNFRC  --   --   --  <0.10*  --   --   CREATININE 3.29*  --   --  3.18* 2.66*  --   TROPONINIHS 40* 44*  --   --   --   --    < > = values in this interval not displayed.    Estimated Creatinine Clearance: 42.9 mL/min (A) (by C-G formula based on SCr of 2.66 mg/dL (H)).   Medical History: Past Medical History:  Diagnosis Date   Heart failure with reduced ejection fraction (HCC) 2017   [facility]   Hypertension 2017   [facility]    Medications:  Scheduled:   amiodarone  200 mg Oral Daily   atorvastatin  20 mg Oral Daily   Chlorhexidine  Gluconate Cloth  6 each Topical Daily   digoxin   0.0625 mg Oral Daily   insulin  aspart  0-15 Units Subcutaneous TID WC   melatonin  3 mg Oral QHS   rivaroxaban   15 mg Oral Q supper   sodium chloride  flush  10-40 mL Intracatheter Q12H    Assessment: On amiodarone prior to admission with concomitant use of oral anticoagulant/vitamin K antagonist, warfarin. , the INR of >10 upon admission was likely as a result of a hypoprothrombinemic response to the DDI. INR has drifted down to 6.4 (normal target therapeutic range on warfarin 2.0 - 3.0) from >10 after one dose only of oral vitamin K1/phytonadione 2.5 mg administered at  0517 hours on 10-Mar-2024.   Goal of Therapy: INR < 3.0 at which time, the current plan is for discontinuation of warfarin and converting to the DOAC, rivaroxaban , an oral Factor Xa inhibitor, which, based upon current renal function (Clcr 42.9 mL/min) will require a dose reduction from the usual 20 mg dose to the 15 mg dose which is given for Clcr 15 - 50 mL/min.   7/1 PM update: INR resulted 2.8  Monitor platelets by anticoagulation protocol: Yes   Plan:  Start rivaroxaban /Xarelto , 20 mg PO every day with evening meal (CRCL w/ TBW ~57 ml/min) Monitor for signs of bleeding F/up on copay check and educating patient on 7/2  Thank you  Benedetta Heath BS, PharmD, BCPS Clinical Pharmacist 03/11/2024 8:21 PM  Contact: [phone] after 3 PM  Be curious, not judgmental... -Davina Sprinkles

## 2024-03-11 NOTE — Progress Notes (Signed)
 Orthopedic Tech Progress Note Patient Details:  Justin [name] 1980-10-01 [id number]  Ortho Devices Type of Ortho Device: Radio broadcast assistant Ortho Device/Splint Location: BLE Ortho Device/Splint Interventions: Ordered, Application, Adjustment   Post Interventions Patient Tolerated: Well Instructions Provided: Other (comment)  [name] 03/11/2024, 3:09 PM

## 2024-03-11 NOTE — Progress Notes (Addendum)
 Advanced Heart Failure Team Consult Note   Primary Physician: [name], DO Cardiologist:  Shelda Bruckner, MD  Reason for Consultation: A/C BiV systolic heart failure  HPI:    Justin [name] is seen today for evaluation of acute on chronic biV systolic heart failure at the request of Dr. Napoleon.    He is a 43 y.o. Spanish-speaking male with history of chronic systolic CHF, hx uncontrolled HTN, DM II, mitral regurgitation, ETOH abuse, hx crack/cocaine use.   Heart failure dates back to 2017. Admitted to [facility] with acute systolic CHF and hypertensive urgency. Echo showed severe LV dsyfunction and severe LVH. Cath with no CAD.   Admitted in 06/23 with acute respiratory failure with hypoxia requiring BiPAP 2/2 a/c CHF. He was diuresed and initiated on GDMT. After discharge he no-showed TOC and cardiology clinic follow-ups.    Admitted 1/24 and 7/24 for CHF exacerbation and volume overload requiring IV diuresis due to noncompliance with medications. After last admission was restarted on Entresto , Lasix , Jardiance , Spiro and Metoprolol . He no showed to post hospital follow ups with Advanced Heart Failure Clinic and Cardiology.   Echo 6/24: EF 20-25% gHK, GIIDD, RV mod reduced, mild MVR, IVC <50% variability.  Admitted 12/24 with A/C HFrEF in the setting of noncompliance. Diuresed and GDMT restarted.   Presented to the ED with BLE edema and orthopnea and SOB. Since discharged in December he only used his meds that he got on discharge day and got no further refills. He tells me that he was recently admitted in a hospital in [place]  2 weeks ago for similar symptoms.  When he was discharged he reports taking the meds he was discharged on but they weren't working for him as his legs blew up within 2 days. Reports no ETOH, cocaine or tobacco use. Admission labs reviewed: BNP 2.5K, HsTrop 40>44, Hgb 14.5, SCr 3.29, K 3.9, Na 141, Dig <0.04, INR >10, A1c  10.3, renal US  with progressive renal disease. CXR with pulmonary edema. Echo this admit EF 20-25%, LV with GHK, RA severely reduced, LA/RA severely dilated, mod-severe MR, mild-mod TR. Diuresis has been started with IV lasix . Weight down 4lbs.   Sitting on EOB. Denies CP/SOB. Currently living with an aunt. 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    Home Medications Prior to Admission medications   Medication Sig Start Date End Date Taking? Authorizing Provider  amiodarone (PACERONE) 400 MG tablet Take 400 mg by mouth 2 (two) times daily. 03/07/24  Yes [provider]  atorvastatin (LIPITOR) 20 MG tablet Take 20 mg by mouth at bedtime. 03/06/24  Yes [provider]  bumetanide (BUMEX) 1 MG tablet Take 1 mg by mouth 2 (two) times daily. 03/06/24  Yes [provider]  glimepiride  (AMARYL ) 2 MG tablet Take 2 mg by mouth daily. 03/06/24  Yes [provider]  metoprolol  succinate (TOPROL -XL) 50 MG 24 hr tablet Take 50 mg by mouth 2 (two) times daily. 03/06/24  Yes [provider]  midodrine (PROAMATINE) 5 MG tablet Take 5 mg by mouth 3 (three) times daily. 03/06/24  Yes [provider]  warfarin (COUMADIN) 5 MG tablet Take 5 mg by mouth daily. 03/07/24  Yes [provider]    Past Medical History: Past Medical History:  Diagnosis Date   Heart failure with reduced ejection fraction (HCC) 2017   [facility]   Hypertension 2017   [facility]    Past Surgical History: Past Surgical History:  Procedure Laterality Date   RIGHT/LEFT HEART CATH AND CORONARY ANGIOGRAPHY N/A 02/16/2022   Procedure: RIGHT/LEFT HEART CATH AND CORONARY ANGIOGRAPHY;  Surgeon: Wonda Sharper, MD;  Location: [facility] INVASIVE CV LAB;  Service: Cardiovascular;  Laterality: N/A;    Family History: Family History  Problem Relation Age of Onset   Hypertension Father     Social History: Social History   Socioeconomic History   Marital status: Single     Spouse name: Not on file   Number of children: 3   Years of education: Not on file   Highest education level: 7th grade  Occupational History   Occupation: Education administrator    Comment: varies  Tobacco Use   Smoking status: Never   Smokeless tobacco: Never  Vaping Use   Vaping status: Never Used  Substance and Sexual Activity   Alcohol use: Yes    Alcohol/week: 30.0 standard drinks of alcohol    Types: 30 Cans of beer per week    Comment: occ   Drug use: Not Currently    Types: Crack cocaine, Cocaine    Comment: 2 weeks   Sexual activity: Not on file  Other Topics Concern   Not on file  Social History Narrative   Not on file   Social Drivers of Health   Financial Resource Strain: Medium Risk (06/05/2022)   Overall Financial Resource Strain (CARDIA)    Difficulty of Paying Living Expenses: Somewhat hard  Food Insecurity: Food Insecurity Present (03/10/2024)   Hunger Vital Sign    Worried About Running Out of Food in the Last Year: Sometimes true    Ran Out of Food in the Last Year: Sometimes true  Transportation Needs: No Transportation Needs (03/10/2024)   PRAPARE - Administrator, Civil Service (Medical): No    Lack of Transportation (Non-Medical): No  Physical Activity: Not on file  Stress: Not on file  Social Connections: Not on file    Allergies:  No Known Allergies  Objective:    Vital Signs:   Temp:  [97.7 F (36.5 C)-98.8 F (37.1 C)] 97.8 F (36.6 C) (07/01 1048) Pulse Rate:  [108-115] 113 (07/01 1048) Resp:  [18-25] 20 (07/01 1048) BP: (108-149)/(77-104) 108/77 (07/01 1048) SpO2:  [94 %-99 %] 94 % (07/01 1048) Weight:  [111.2 kg-113.3 kg] 111.2 kg (07/01 0420) Last BM Date :  (PTA)  Weight change: Filed Weights   03/10/24 1549 03/11/24 0420  Weight: 113.3 kg 111.2 kg   Intake/Output:   Intake/Output Summary (Last 24 hours) at 03/11/2024 1202 Last data filed at 03/11/2024 0631 Gross per 24 hour  Intake 480 ml  Output 2500 ml  Net -2020 ml     Physical Exam  General:  well appearing.  No respiratory difficulty. Sitting on EOB eating lunch.  Neck: supple. JVD elevated.  Cor: PMI nondisplaced. Tachy/regular rate & rhythm. No rubs, gallops or murmurs. Lungs: clear Extremities: +2-3 BLE edema  Neuro: alert & oriented x 3. Moves all 4 extremities w/o difficulty. Affect pleasant.   Telemetry   ST 100-110s (Personally reviewed)    EKG    ST 109 bpm   Labs   Basic Metabolic Panel: Recent Labs  Lab 03/09/24 1842 03/10/24 0222 03/11/24 0251  NA 141 142 139  K 3.9 3.7 3.6  CL 104 104 103  CO2 24 27 28   GLUCOSE 122* 122* 107*  BUN 70* 67* 53*  CREATININE 3.29* 3.18* 2.66*  CALCIUM  8.7* 8.5* 8.5*  MG  --   --  2.0    Liver Function Tests: Recent Labs  Lab 03/09/24 2059  AST 36  ALT 30  ALKPHOS 89  BILITOT 1.0  PROT 6.3*  ALBUMIN 2.8*   Recent Labs  Lab 03/09/24 2059  LIPASE 56*   No results for input(s): AMMONIA in the last 168 hours.  CBC: Recent Labs  Lab 03/09/24 1842 03/11/24 0251  WBC 7.0 6.3  HGB 14.5 14.5  HCT 47.4 46.8  MCV 89.6 88.8  PLT 269 216    Cardiac Enzymes: No results for input(s): CKTOTAL, CKMB, CKMBINDEX, TROPONINI in the last 168 hours.  BNP: BNP (last 3 results) Recent Labs    08/22/23 0722 03/09/24 1845  BNP 951.5* 2,578.5*    ProBNP (last 3 results) No results for input(s): PROBNP in the last 8760 hours.   CBG: Recent Labs  Lab 03/10/24 1215 03/10/24 1615 03/10/24 2103 03/11/24 0602 03/11/24 1049  GLUCAP 129* 90 172* 135* 143*    Coagulation Studies: Recent Labs    03/10/24 0042 03/10/24 0222 03/11/24 0251  LABPROT 85.2* 85.3* 58.8*  INR >10.0* >10.0* 6.4*     Imaging   ECHOCARDIOGRAM COMPLETE Result Date: 03/11/2024    ECHOCARDIOGRAM REPORT   Patient Name:   Churchill Grimsley [name] Date of Exam: 03/11/2024 Medical Rec #:  [id number]                      Height:       68.0 in Accession #:    [id number]                      Weight:       245.1 lb Date of Birth:  1981-02-08                      BSA:          2.228 m Patient Age:    43 years                       BP:           124/99 mmHg Patient Gender: M                              HR:           111 bpm. Exam Location:  Inpatient Procedure: 2D Echo, Cardiac Doppler, Color Doppler and Intracardiac            Opacification Agent (Both Spectral and Color Flow Doppler were            utilized during procedure). Indications:    Dyspnea  History:        Patient has prior history of Echocardiogram examinations.                 Signs/Symptoms:Dyspnea.  Sonographer:    Vella Key Referring Phys: JOSEPH GOODWIN IMPRESSIONS  1. No left ventricular thrombus is seen, but Definity  contrast was not used. Left ventricular ejection fraction, by estimation, is 20 to 25%. The left ventricle has severely decreased function. The left ventricle demonstrates global hypokinesis. The left ventricular internal cavity size was mildly dilated. There is moderate concentric left ventricular hypertrophy. Indeterminate diastolic filling due to E-A fusion.  2. Right ventricular systolic function is severely reduced. The right ventricular size is severely enlarged. There is moderately elevated pulmonary artery systolic pressure. The estimated right ventricular  systolic pressure is 59.1 mmHg.  3. Left atrial size was severely dilated.  4. Right atrial size was severely dilated.  5. The mitral valve is abnormal. Moderate to severe mitral valve regurgitation.  6. Tricuspid valve regurgitation is mild to moderate.  7. The aortic valve is tricuspid. Aortic valve regurgitation is not visualized. No aortic stenosis is present.  8. The inferior vena cava is dilated in size with <50% respiratory variability, suggesting right atrial pressure of 15 mmHg. Comparison(s): The left ventricular function is unchanged. The right ventricular systolic function is significantly worse. Mitral insufficiency is much worse. FINDINGS  Left  Ventricle: No left ventricular thrombus is seen, but Definity  contrast was not used. Left ventricular ejection fraction, by estimation, is 20 to 25%. The left ventricle has severely decreased function. The left ventricle demonstrates global hypokinesis. The left ventricular internal cavity size was mildly dilated. There is moderate concentric left ventricular hypertrophy. Indeterminate diastolic filling due to E-A fusion. Right Ventricle: The right ventricular size is severely enlarged. No increase in right ventricular wall thickness. Right ventricular systolic function is severely reduced. There is moderately elevated pulmonary artery systolic pressure. The tricuspid regurgitant velocity is 3.32 m/s, and with an assumed right atrial pressure of 15 mmHg, the estimated right ventricular systolic pressure is 59.1 mmHg. Left Atrium: Left atrial size was severely dilated. Right Atrium: Right atrial size was severely dilated. Pericardium: Trivial pericardial effusion is present. Mitral Valve: There is mitral leaflet malcoaptation due to posterior leaflet tethering. The mitral valve is abnormal. Moderate to severe mitral valve regurgitation, with eccentric posteriorly directed jet. Tricuspid Valve: The tricuspid valve is normal in structure. Tricuspid valve regurgitation is mild to moderate. Aortic Valve: The aortic valve is tricuspid. Aortic valve regurgitation is not visualized. No aortic stenosis is present. Pulmonic Valve: The pulmonic valve was grossly normal. Pulmonic valve regurgitation is trivial. No evidence of pulmonic stenosis. Aorta: The aortic root and ascending aorta are structurally normal, with no evidence of dilitation. Venous: A systolic blunting flow pattern is recorded from the right upper pulmonary vein. The inferior vena cava is dilated in size with less than 50% respiratory variability, suggesting right atrial pressure of 15 mmHg. IAS/Shunts: No atrial level shunt detected by color flow Doppler.   LEFT VENTRICLE PLAX 2D LVIDd:         6.00 cm LVIDs:         5.00 cm LV PW:         1.40 cm LV IVS:        1.40 cm LVOT diam:     1.90 cm LV SV:         18 LV SV Index:   8 LVOT Area:     2.84 cm  LV Volumes (MOD) LV vol d, MOD A2C: 265.0 ml LV vol d, MOD A4C: 228.0 ml LV vol s, MOD A2C: 243.0 ml LV vol s, MOD A4C: 154.0 ml LV SV MOD A2C:     22.0 ml LV SV MOD A4C:     228.0 ml LV SV MOD BP:      55.0 ml RIGHT VENTRICLE RV Basal diam:  5.50 cm RV S prime:     4.90 cm/s TAPSE (M-mode): 1.2 cm LEFT ATRIUM            Index        RIGHT ATRIUM           Index LA diam:      6.50 cm  2.92 cm/m  RA Area:     35.30 cm LA Vol (A4C): 187.0 ml 83.92 ml/m  RA Volume:   144.00 ml 64.62 ml/m  AORTIC VALVE LVOT Vmax:   47.00 cm/s LVOT Vmean:  36.700 cm/s LVOT VTI:    0.064 m  AORTA Ao Root diam: 3.20 cm Ao Asc diam:  3.50 cm TRICUSPID VALVE TV Peak grad:   44.1 mmHg TV Vmax:        3.32 m/s TR Peak grad:   44.1 mmHg TR Vmax:        332.00 cm/s  SHUNTS Systemic VTI:  0.06 m Systemic Diam: 1.90 cm Mihai Croitoru MD Electronically signed by Jerel Balding MD Signature Date/Time: 03/11/2024/11:41:05 AM    Final     Medications:   Current Medications:  atorvastatin  20 mg Oral Daily   digoxin   0.0625 mg Oral Daily   furosemide   80 mg Intravenous BID   insulin  aspart  0-15 Units Subcutaneous TID WC   melatonin  3 mg Oral QHS    Infusions:  phytonadione (VITAMIN K) 1 mg in dextrose  5 % 50 mL IVPB      Patient Profile  Justin [name] is a 43 y.o. Spanish-speaking male with history of chronic systolic CHF, hx uncontrolled HTN, DM II, mitral regurgitation, ETOH abuse, hx crack/cocaine use. AHF team to see for A/C combined systolic and diastolic heart failure Assessment/Plan  Acute on Chronic combined systolic and diastolic CHF: - Suspect CM most likely d/t HCM from uncontrolled HTN in setting of noncompliance, ETOH abuse, & cocaine abuse.  - Echo (6/23): EF 30-35%, RV okay, mild to moderate MR. -  R/LHC (6/23): No significant CAD, RA mean 1, PA mean 19, PCWP 3, LVEDP 7, Fick CO/CI 4.17/1.99 - cMRI (6/23): LVEF 29%, LV severely dilated, asymmetric LVH with basal septum measuring 17 mm, RVEF 45%, patchy LGE in setpum and RV insertion sites + subepicardial LGE in inferolateral wall. Scar pattern felt to be consistent with HCM. However, d/t subepicardial LGE in inferolateral wall and presence of mediastinal lymphadenopathy, cardiac PET recommended to r/o sarcoid, small pericardial effusion.  - Echo (9/23): EF 15-20%, GIIIDD, RV mildly dilated and mod reduced function, Mod MR, trivial TR - Echo (6/24): EF 20-25% gHK, GIIDD, RV mod reduced, mild MVR, IVC <50% variability. - Echo 12/24: EF 20-25%, RV mildly reduced, IVC not dilated - Echo this admit EF 20-25%, LV with GHK, RA severely reduced, LA/RA severely dilated, mod-severe MR, mild-mod TR - NYHA IV on admission. Recently discharged from hospital in [place]  for similar presentation. Suspect initially 2/2 medication noncompliance.  - Volume elevated on exam. Increase lasix  80>120 mg BID. Add 5 mg metolazone  today.  - Check lactic acid. Consider PICC line placement    - No ACE/ARB/ARNi/MRA with current AKI - Stop SGLT2i, A1c >10 - Continue digoxin  0.0625 mg daily, if renal function does not improve can consider stopping but suspect it will with diuresis. Dig level <0.04 on admission (as he was not taking) - Hold bb with acute exacerbation  2. Atrial fibrillation:  - Diagnosed 1/24. Previously on Xarelto , although historically noncompliant with a/c - In ST - He was switched to warfarin in [place] d/t cost, patient uninsured - Holding [facility] with INR >10 on admission. 6.4 today. Follow. Getting vitamin K.  - Needs sleep study, but uninsured - On amiodarone at recent discharge. Will start 200 mg daily   3. HTN:  - Stable   4. Polysubstance abuse: ETOH and crack/cocaine use.  -  UDS negative - Denies ETOH use   5. DM II: Hgb A1c >10 this admission. He  is not on insulin . - Keep off SGLT2i. No GU symptoms.   6. AKI on CKD II: baseline SCr 1.3-1.5. Cardiorenal.   - SCr up to 3.29 on admission - 2.66 today, improving with diuresis   7. Hyperlipidemia: LDL 30 5/25 - Continue statin   Medication concerns reviewed with patient and pharmacy team. Barriers identified: Medication nncompliance, language barrier, failure to show up to appts.   Length of Stay: 1  Justin LITTIE Coe, NP  03/11/2024, 12:02 PM   Advanced Heart Failure Team Pager [phone] (M-F; 7a - 5p)  Please contact [facility] for night-coverage after hours (4p -7a ) and weekends on [email or web address]   Patient seen with NP, I formulated the plan and agree with the above.    Patient has been readmitted with ?AKI (not sure of recent baseline) and acute/chronic systolic CHF.  He was recently hospitalized in [place]  and diuresed, sent out on amiodarone, bumetanide, midodrine, metoprolol , and warfarin.  He was started on warfarin with PAF and inability to afford DOAC.  Compliance has been poor, he has missed multiple followups.  He does not have insurance. Also language barrier (only Spanish-speaking).  He used to use cocaine and drink heavily but says that he has quit both cocaine and ETOH (UDS was negative this admission).    I reviewed his echo, EF 20-25% with moderate concentric LVH, severe RV dysfunction, probably severe functional MR with PISA ERO 0.47 cm^2, IVC dilated.   Lactate 1.8 today.  Creatinine 3.29 at admission, down to 2.66 with diuresis.  Last creatinine in our system was 1.6 in 12/24. Baseline uncertain.  INR was > 10 at admission, had been taking warfarin without getting INR checked.   General: NAD Neck: JVP 16+, no thyromegaly or thyroid nodule.  Lungs: Clear to auscultation bilaterally with normal respiratory effort. CV: Nondisplaced PMI.  Heart regular S1/S2, no S3/S4, 2/6 HSM apex.  1+ edema to knees.  No carotid bruit.  Normal pedal pulses.  Abdomen: Soft, nontender, no  hepatosplenomegaly, no distention.  Skin: Intact without lesions or rashes.  Neurologic: Alert and oriented x 3.  Psych: Normal affect. Extremities: No clubbing or cyanosis. Cool.  HEENT: Normal.   1. Acute on chronic systolic CHF: Long history of nonischemic cardiomyopathy. Cath in 6/23 with no significant CAD, CI 1.99.  Substance abuse (cocaine and ETOH) thought to play a role in cardiomyopathy, also uncontrolled HTN in the past.  However, cardiac MRI was done in 6/23 showing LVEF 29%, LV severely dilated, asymmetric LVH with basal septum measuring 17 mm, RVEF 45%, patchy LGE in septum and RV insertion sites + subepicardial LGE in inferolateral wall. Scar pattern and asymmetric hypertrophy could be consistent with hypertrophic cardiomyopathy. However, due to subepicardial LGE in inferolateral wall and presence of mediastinal lymphadenopathy, cardiac PET recommended to rule out cardiac sarcoidosis.  This was never done due to lack of insurance.  Echo this admission showed EF 20-25% with moderate concentric LVH, severe RV dysfunction, probably severe functional MR with PISA ERO 0.47 cm^2, IVC dilated.  Patient was admitted with marked volume overload, NYHA class III symptoms and cardiorenal syndrome with creatinine 3.29 (uncertain of current baseline).  Lactate 1.8, but cool extremities do concern me for low output HF. Creatinine has improved to 2.6 with diuresis.  - Place PICC to follow CVP and co-ox.  If co-ox is low, think he can start milrinone 0.25 mcg/kg/min  to facilitate diuresis.  - He was restarted on low dose digoxin  0.0625 despite elevated creatinine.  He can continue this as long as creatinine trends down, but needs to stop if creatinine rises.  - GDMT will be limited by elevated creatinine though current BP is stable.  Do not think he needs midodrine (was apparently started on this at las admission in [place] ). May be able to use hydralazine /nitrates.  - He needs aggressive diuresis, Increase  Lasix  to 120 mg IV bid and will give a dose of metolazone .  Replace K.  - Not a good candidate for advanced therapies with noncompliance, lack of insurance, RV dysfunction and AKI.  2. Atrial fibrillation: Paroxysmal.  In NSR currently.  He was started on warfarin at recent admission in [place]  but has not been having his INR checked so INR was > 10 at admission.  Now down to 6.4.  - Continue to hold warfarin with supratherapeutic INR.  - Will need to review whether there is any option to use DOAC with pharmacy.  - He had been on amiodarone apparently after hospitalization in [place] .  Will restart for now at 200 mg daily, can increase to bid if we start milrinone.  3. ?AKI on CKD stage 3: Suspect cardiorenal syndrome.  Last creatinine here in 12/24 was 1.6.  Creatinine 3.29 at admission, down to 2.6 with diuresis.  - Continue diuresis, hopefully creatinine will continue to trend down as we lower renal venous pressure. - If co-ox is low, will maintain CO with milrinone.  4. Mitral regurgitation: Severe functional MR on echo this admission.  - Continue aggressive diuresis, will recheck after diuresis and titration of GDMT.  5. ETOH/cocaine abuse: UDS negative this admission. Says he has quit.  6. Uncontrolled DM2: HgbA1c 10.3, per primary team.  7. Noncompliance: Has not followed up in HF clinic and generally not taking medications.  Does not have insurance.  - Needs social work consult.   Management will be very difficult with lack of insurance and social issues (traveling back and forth to [place] , does not come to oupatient appts).   [name] 03/11/2024 2:09 PM

## 2024-03-11 NOTE — Evaluation (Signed)
 Physical Therapy Brief Evaluation and Discharge Note Patient Details Name: Justin [name] MRN: [id number] DOB: Jun 18, 1981 Today's Date: 03/11/2024   History of Present Illness  42 yo adm 6/29 with chest pain, SOB, supratherapeutic INR, HFrEF exacerbation. PMhx: CHF, HTN, HLD, NICM, T2DM, Afib, substance abuse  Clinical Impression  Pt pleasant, utilized ipad intepreter throughout. Pt reports not being connected with HF clinic, does not have scale or pulse oximeter and does not use O2 outside of the home. Educated pt for daily weight, diet, walking program and need for scale and pulse oximeter. Spanish heart failure booklet provided. Pt's mobility at baseline and able to maintain 90-95% on 2L throughout gait. No further acute therapy needs with pt in agreement, will sign off.        PT Assessment Patient does not need any further PT services  Assistance Needed at Discharge  None    Equipment Recommendations Other (comment) (scale, pulse oximeter)  Recommendations for Other Services       Precautions/Restrictions Precautions Precautions: None        Mobility  Bed Mobility       General bed mobility comments: EOB on arrival  Transfers Overall transfer level: Independent                      Ambulation/Gait Ambulation/Gait assistance: Independent Gait Distance (Feet): 400 Feet Assistive device: None Gait Pattern/deviations: WFL(Within Functional Limits) Gait Speed: Pace WFL    Home Activity Instructions    Stairs            Modified Rankin (Stroke Patients Only)        Balance Overall balance assessment: No apparent balance deficits (not formally assessed)                        Pertinent Vitals/Pain PT - Brief Vital Signs All Vital Signs Stable: Yes (SPO2 90-95% on 2L throughout) Pain Assessment Pain Assessment: No/denies pain     Home Living Family/patient expects to be discharged to:: Private residence Living  Arrangements: Other relatives Available Help at Discharge: Family;Available 24 hours/day Home Environment: Stairs to enter  [employer] of Steps: 2 Home Equipment: None   Additional Comments: reports wearing oxygen at home; as needed per pt but does not wear outside    Prior Function        UE/LE Assessment   UE ROM/Strength/Tone/Coordination: [facility]    LE ROM/Strength/Tone/Coordination: [facility]      Communication   Communication Communication: Other (comment) Factors Affecting Communication: Other (comment) (interpreter Buel [phone])     Cognition Overall Cognitive Status: Appears within functional limits for tasks assessed/performed       General Comments      Exercises     Assessment/Plan    PT Problem List         PT Visit Diagnosis Other abnormalities of gait and mobility (R26.89)    No Skilled PT All education completed   Co-evaluation                AMPAC 6 Clicks Help needed turning from your back to your side while in a flat bed without using bedrails?: None Help needed moving from lying on your back to sitting on the side of a flat bed without using bedrails?: None Help needed moving to and from a bed to a chair (including a wheelchair)?: None Help needed standing up from a chair using your arms (e.g., wheelchair or bedside chair)?: None Help  needed to walk in hospital room?: None Help needed climbing 3-5 steps with a railing? : None 6 Click Score: 24      End of Session Equipment Utilized During Treatment: Oxygen Activity Tolerance: Patient tolerated treatment well Patient left: in chair;with call bell/phone within reach Nurse Communication: Mobility status PT Visit Diagnosis: Other abnormalities of gait and mobility (R26.89)     Time: 9046-8979 PT Time Calculation (min) (ACUTE ONLY): 27 min  Charges:   PT Evaluation $PT Eval Low Complexity: 1 Low PT Treatments $Therapeutic Activity: 8-22 mins    Lenoard SQUIBB, PT Acute  Rehabilitation Services Office: [phone]   Lenoard NOVAK Almedia Cordell  03/11/2024, 10:38 AM

## 2024-03-11 NOTE — TOC Initial Note (Signed)
 Transition of Care [facility]) - Initial/Assessment Note    Patient Details  Name: Justin [name] MRN: [id number] Date of Birth: October 03, 1980  Transition of [facility]) CM/SW Contact:    [name], RN Phone Number: [phone] 03/11/2024, 11:38 AM  Clinical Narrative:    Spanish interpreter used for communication.              Spoke to pt. Provided pt with Living Better with HF and scale. Discussed daily weights. Pt is without insurance. Will need assistance with medications and PCP appt.    Expected Discharge Plan: Home/Self Care Barriers to Discharge: Continued Medical Work up   Patient Goals and CMS Choice Patient states their goals for this hospitalization and ongoing recovery are:: wants to get better          Expected Discharge Plan and Services   Discharge Planning Services: CM Consult   Living arrangements for the past 2 months: Single Family Home                                      Prior Living Arrangements/Services Living arrangements for the past 2 months: Single Family Home   Patient language and need for interpreter reviewed:: Yes        Need for Family Participation in Patient Care: No (Comment) Care giver support system in place?: No (comment)      Activities of Daily Living   ADL Screening (condition at time of admission) Independently performs ADLs?: Yes (appropriate for developmental age) Is the patient deaf or have difficulty hearing?: No Does the patient have difficulty seeing, even when wearing glasses/contacts?: No Does the patient have difficulty concentrating, remembering, or making decisions?: No  Permission Sought/Granted Permission sought to share information with : Case Manager, Family Supports, PCP Permission granted to share information with : Yes, Verbal Permission Granted  Share Information with NAME: Suan Proctor  Permission granted to share info w AGENCY: PCP  Permission granted to share info w  Relationship: sister  Permission granted to share info w Contact Information: [phone]  Emotional Assessment Appearance:: Appears stated age Attitude/Demeanor/Rapport: Engaged Affect (typically observed): Accepting Orientation: : Oriented to Self, Oriented to Place, Oriented to  Time, Oriented to Situation   Psych Involvement: No (comment)  Admission diagnosis:  Acute on chronic systolic congestive heart failure (HCC) [I50.23] AKI (acute kidney injury) (HCC) [N17.9] Acute hypoxic respiratory failure (HCC) [J96.01] Patient Active Problem List   Diagnosis Date Noted   Acute hypoxic respiratory failure (HCC) 03/09/2024   Acute on chronic heart failure (HCC) 08/22/2023   CHF (congestive heart failure) (HCC) 03/10/2023   Uncontrolled hypertension 11/17/2022   Type 2 diabetes mellitus without complication, without long-term current use of insulin  (HCC) 11/17/2022   Acute exacerbation of CHF (congestive heart failure) (HCC) 11/16/2022   Paroxysmal atrial fibrillation with RVR (HCC) 09/22/2022   Acute on chronic congestive heart failure (HCC) 09/21/2022   Chronic kidney disease, stage II (mild) 09/21/2022   Alcohol abuse 09/21/2022   Class 3 obesity 06/04/2022   AKI (acute kidney injury) (HCC) 06/03/2022   Acute on chronic systolic (congestive) heart failure (HCC) 06/02/2022   Type 2 diabetes mellitus with hyperlipidemia (HCC) 06/02/2022   Polysubstance abuse (HCC) 06/02/2022   Ventricular tachycardia (HCC)    Primary hypertension    Cardiomyopathy due to hypertension, with heart failure (HCC)    Acute systolic CHF (congestive heart failure) (HCC)  Nonrheumatic mitral valve regurgitation    Dyspnea    Community acquired pneumonia    Acute hypoxemic respiratory failure (HCC) 02/09/2022   Hypertriglyceridemia 04/18/2018   Nonischemic cardiomyopathy (HCC) 03/29/2018   Chronic diastolic heart failure (HCC) 03/29/2018   Essential hypertension 03/29/2018   PCP:  [name],  DO Pharmacy:   [facility] [address] [place] [address] Phone: [phone] Fax: [phone]     Social Drivers of Health (SDOH) Social History: SDOH Screenings   Food Insecurity: Food Insecurity Present (03/10/2024)  Housing: High Risk (03/10/2024)  Transportation Needs: No Transportation Needs (03/10/2024)  Utilities: Not At Risk (03/10/2024)  Alcohol Screen: Medium Risk (02/20/2022)  Depression (PHQ2-9): Low Risk  (10/12/2022)  Financial Resource Strain: Medium Risk (06/05/2022)  Tobacco Use: Low Risk  (03/09/2024)  Health Literacy: Inadequate Health Literacy (09/21/2023)   SDOH Interventions:     Readmission Risk Interventions     No data to display

## 2024-03-11 NOTE — Progress Notes (Signed)
 Peripherally Inserted Central Catheter Placement  The IV Nurse has discussed with the patient and/or persons authorized to consent for the patient, the purpose of this procedure and the potential benefits and risks involved with this procedure.  The benefits include less needle sticks, lab draws from the catheter, and the patient may be discharged home with the catheter. Risks include, but not limited to, infection, bleeding, blood clot (thrombus formation), and puncture of an artery; nerve damage and irregular heartbeat and possibility to perform a PICC exchange if needed/ordered by physician.  Alternatives to this procedure were also discussed.  Bard Power PICC patient education guide, fact sheet on infection prevention and patient information card has been provided to patient /or left at bedside.   Consent obtained with interpreter    PICC Placement Documentation  PICC Double Lumen 03/11/24 Right Basilic 41 cm 0 cm (Active)  Indication for Insertion or Continuance of Line Chronic illness with exacerbations (CF, Sickle Cell, etc.) 03/11/24 1600  Exposed Catheter (cm) 0 cm 03/11/24 1600  Site Assessment Clean, Dry, Intact 03/11/24 1600  Lumen #1 Status Flushed;Saline locked;Blood return noted 03/11/24 1600  Lumen #2 Status Flushed;Saline locked;Blood return noted 03/11/24 1600  Dressing Type Transparent;Securing device 03/11/24 1600  Dressing Status Antimicrobial disc/dressing in place;Clean, Dry, Intact 03/11/24 1600  Line Care Connections checked and tightened 03/11/24 1600  Line Adjustment (NICU/IV Team Only) No 03/11/24 1600  Dressing Intervention New dressing;Adhesive placed at insertion site (IV team only) 03/11/24 1600  Dressing Change Due 03/18/24 03/11/24 1600       Ethyl Priestly Renee 03/11/2024, 4:58 PM

## 2024-03-11 NOTE — Progress Notes (Signed)
 Orthopedic Tech Progress Note Patient Details:  Justin [name] Oct 24, 1980 [id number]  Patient ID: Justin [name] [name], male   DOB: 1981/06/25, 43 y.o.   MRN: [id number] Agreed with rn to switch unna boots on tues/fri schedule Justin [name] 03/11/2024, 3:10 PM

## 2024-03-11 NOTE — Progress Notes (Signed)
 OT Cancellation Note  Patient Details Name: Justin [name] MRN: [id number] DOB: Mar 14, 1981   Cancelled Treatment:    Reason Eval/Treat Not Completed: OT screened, no needs identified, will sign off (Discussed with PT. Briefly talked with pt while walking in the hall. Pt would benefit from nsg education on management of CHF and sign/symmptoms of CHF exacerbation.)  Aniko Finnigan,HILLARY 03/11/2024, 10:08 AM Kreg Sink, OT/L   Acute OT Clinical Specialist Acute Rehabilitation Services Pager [phone] Office [phone]

## 2024-03-11 NOTE — Progress Notes (Addendum)
 PHARMACY - ANTICOAGULATION CONSULT NOTE  Pharmacy Consult for rivaroxaban /Xarelto  Indication: atrial fibrillation  No Known Allergies  Patient Measurements: Height: 5' 8 (172.7 cm) Weight: 111.2 kg (245 lb 2.4 oz) IBW/kg (Calculated) : 68.4 HEPARIN  DW (KG): 93.8  Vital Signs: Temp: 97.8 F (36.6 C) (07/01 0712) Temp Source: Oral (07/01 0712) BP: 126/104 (07/01 0712) Pulse Rate: 115 (07/01 0712)  Labs: Recent Labs    03/09/24 1842 03/09/24 2059 03/10/24 0042 03/10/24 0222 03/11/24 0251  HGB 14.5  --   --   --  14.5  HCT 47.4  --   --   --  46.8  PLT 269  --   --   --  216  LABPROT  --   --  85.2* 85.3* 58.8*  INR  --   --  >10.0* >10.0* 6.4*  HEPARINUNFRC  --   --   --  <0.10*  --   CREATININE 3.29*  --   --  3.18* 2.66*  TROPONINIHS 40* 44*  --   --   --     Estimated Creatinine Clearance: 42.9 mL/min (A) (by C-G formula based on SCr of 2.66 mg/dL (H)).   Medical History: Past Medical History:  Diagnosis Date   Heart failure with reduced ejection fraction (HCC) 2017   [facility]   Hypertension 2017   [facility]    Medications:  Scheduled:   atorvastatin  20 mg Oral Daily   digoxin   0.0625 mg Oral Daily   furosemide   80 mg Intravenous BID   insulin  aspart  0-15 Units Subcutaneous TID WC   melatonin  3 mg Oral QHS    Assessment: On amiodarone prior to admission with concomitant use of oral anticoagulant/vitamin K antagonist, warfarin. , the INR of >10 upon admission was likely as a result of a hypoprothrombinemic response to the DDI. INR has drifted down to 6.4 (normal target therapeutic range on warfarin 2.0 - 3.0) from >10 after one dose only of oral vitamin K1/phytonadione 2.5 mg administered at 0517 hours on 10-Mar-2024.   Goal of Therapy: INR < 3.0 at which time, the current plan is for discontinuation of warfarin and converting to the DOAC, rivaroxaban , an oral Factor Xa inhibitor, which, based upon current renal function (Clcr  42.9 mL/min) will require a dose reduction from the usual 20 mg dose to the 15 mg dose which is given for Clcr 15 - 50 mL/min.   Monitor platelets by anticoagulation protocol: Yes   Plan:  Target INR < 3.0 Discussed with IMTS/[facility] providers. As per anticoagulation approved protocol, given the sluggish response to the one dose of oral vitamin K1 administered at 0517 hours on 10-Mar-2024, we will give administer 1 mg of parenteral vitamin K1 in 50 mL of IV fluid over 60 minutes. In ~ 6 hours after ending infusion will evaluate the INR reduction responsiveness when INR < 3.0. Start rivaroxaban /Xarelto , 15 mg PO every day with evening meal to improve the absorption/bioavailability.   Lynwood KATHEE Lites, PharmD, CPP Clinical Pharmacist Practitioner 03/11/2024,10:40 AM

## 2024-03-11 NOTE — Progress Notes (Signed)
 HD#1 SUBJECTIVE:  Patient Summary: Justin [name] is a 43 y.o. male with a pertinent PMH of HFrEF (last EF 20-25% 12/24), paroxysmal a-fib on anticoagulation, T2DM, HTN who presented with dyspnea, BLE edema and orthopnea and admitted for heart failure exacerbation.  Overnight Events: None  Interim History: Justin [name] has had some anxiety last night. He has not had anxiety in the past and it is worse when he is lying down. Also anxious because he can't sleep. He has not taken anything in the past to help him sleep. He feels that his breathing is getting better as well as the swelling in his legs.   OBJECTIVE:  Vital Signs: Vitals:   03/10/24 2220 03/11/24 0020 03/11/24 0420 03/11/24 0712  BP: (!) 149/96 (!) 121/101 (!) 124/99 (!) 126/104  Pulse: (!) 111 (!) 112 (!) 114 (!) 115  Resp:  18 19 20   Temp: 97.9 F (36.6 C) 98.2 F (36.8 C) 97.9 F (36.6 C) 97.8 F (36.6 C)  TempSrc: Oral Oral Oral Oral  SpO2: 96% 94% 95% 98%  Weight:   111.2 kg   Height:       Supplemental O2: Nasal Cannula SpO2: 98 % O2 Flow Rate (L/min): 2 L/min  Filed Weights   03/10/24 1549 03/11/24 0420  Weight: 113.3 kg 111.2 kg     Intake/Output Summary (Last 24 hours) at 03/11/2024 1150 Last data filed at 03/11/2024 0631 Gross per 24 hour  Intake 480 ml  Output 2500 ml  Net -2020 ml   Net IO Since Admission: -3,420 mL [03/11/24 1150]  Physical Exam: Physical Exam  Cardiovascular:     Heart sounds: No murmur heard.    No systolic murmur is present.     No friction rub. No gallop.  Pulmonary:     Effort: Pulmonary effort is normal.     Comments: Crackles in the left lower lung  Musculoskeletal:     Comments: 2+ edema bilateral lower extremities    Skin:    Comments: Bilateral feet cool to touch, new compared to yesterday      Patient Lines/Drains/Airways Status     Active Line/Drains/Airways     Name Placement date Placement time Site Days    Peripheral IV 03/09/24 20 G Anterior;Distal;Right;Upper Arm 03/09/24  1842  Arm  2            Pertinent labs and imaging:      Latest Ref Rng & Units 03/11/2024    2:51 AM 03/09/2024    6:42 PM 08/25/2023    5:15 AM  CBC  WBC 4.0 - 10.5 K/uL 6.3  7.0  6.8   Hemoglobin 13.0 - 17.0 g/dL 85.4  85.4  83.6   Hematocrit 39.0 - 52.0 % 46.8  47.4  48.5   Platelets 150 - 400 K/uL 216  269  250        Latest Ref Rng & Units 03/11/2024    2:51 AM 03/10/2024    2:22 AM 03/09/2024    8:59 PM  CMP  Glucose 70 - 99 mg/dL 892  877    BUN 6 - 20 mg/dL 53  67    Creatinine 9.38 - 1.24 mg/dL 7.33  6.81    Sodium 864 - 145 mmol/L 139  142    Potassium 3.5 - 5.1 mmol/L 3.6  3.7    Chloride 98 - 111 mmol/L 103  104    CO2 22 - 32 mmol/L 28  27  Calcium  8.9 - 10.3 mg/dL 8.5  8.5    Total Protein 6.5 - 8.1 g/dL   6.3   Total Bilirubin 0.0 - 1.2 mg/dL   1.0   Alkaline Phos 38 - 126 U/L   89   AST 15 - 41 U/L   36   ALT 0 - 44 U/L   30   PT 58.8 INR 6.4  ECHOCARDIOGRAM COMPLETE Result Date: 03/11/2024    ECHOCARDIOGRAM REPORT   Patient Name:   Justin [name] Date of Exam: 03/11/2024 Medical Rec #:  [id number]                      Height:       68.0 in Accession #:    [id number]                     Weight:       245.1 lb Date of Birth:  October 03, 1980                      BSA:          2.228 m Patient Age:    43 years                       BP:           124/99 mmHg Patient Gender: M                              HR:           111 bpm. Exam Location:  Inpatient Procedure: 2D Echo, Cardiac Doppler, Color Doppler and Intracardiac            Opacification Agent (Both Spectral and Color Flow Doppler were            utilized during procedure). Indications:    Dyspnea  History:        Patient has prior history of Echocardiogram examinations.                 Signs/Symptoms:Dyspnea.  Sonographer:    Vella Key Referring Phys: JOSEPH GOODWIN IMPRESSIONS  1. No left ventricular thrombus is seen, but Definity   contrast was not used. Left ventricular ejection fraction, by estimation, is 20 to 25%. The left ventricle has severely decreased function. The left ventricle demonstrates global hypokinesis. The left ventricular internal cavity size was mildly dilated. There is moderate concentric left ventricular hypertrophy. Indeterminate diastolic filling due to E-A fusion.  2. Right ventricular systolic function is severely reduced. The right ventricular size is severely enlarged. There is moderately elevated pulmonary artery systolic pressure. The estimated right ventricular systolic pressure is 59.1 mmHg.  3. Left atrial size was severely dilated.  4. Right atrial size was severely dilated.  5. The mitral valve is abnormal. Moderate to severe mitral valve regurgitation.  6. Tricuspid valve regurgitation is mild to moderate.  7. The aortic valve is tricuspid. Aortic valve regurgitation is not visualized. No aortic stenosis is present.  8. The inferior vena cava is dilated in size with <50% respiratory variability, suggesting right atrial pressure of 15 mmHg. Comparison(s): The left ventricular function is unchanged. The right ventricular systolic function is significantly worse. Mitral insufficiency is much worse. FINDINGS  Left Ventricle: No left ventricular thrombus is seen, but Definity  contrast was not used. Left ventricular ejection fraction, by estimation, is 20  to 25%. The left ventricle has severely decreased function. The left ventricle demonstrates global hypokinesis. The left ventricular internal cavity size was mildly dilated. There is moderate concentric left ventricular hypertrophy. Indeterminate diastolic filling due to E-A fusion. Right Ventricle: The right ventricular size is severely enlarged. No increase in right ventricular wall thickness. Right ventricular systolic function is severely reduced. There is moderately elevated pulmonary artery systolic pressure. The tricuspid regurgitant velocity is 3.32 m/s,  and with an assumed right atrial pressure of 15 mmHg, the estimated right ventricular systolic pressure is 59.1 mmHg. Left Atrium: Left atrial size was severely dilated. Right Atrium: Right atrial size was severely dilated. Pericardium: Trivial pericardial effusion is present. Mitral Valve: There is mitral leaflet malcoaptation due to posterior leaflet tethering. The mitral valve is abnormal. Moderate to severe mitral valve regurgitation, with eccentric posteriorly directed jet. Tricuspid Valve: The tricuspid valve is normal in structure. Tricuspid valve regurgitation is mild to moderate. Aortic Valve: The aortic valve is tricuspid. Aortic valve regurgitation is not visualized. No aortic stenosis is present. Pulmonic Valve: The pulmonic valve was grossly normal. Pulmonic valve regurgitation is trivial. No evidence of pulmonic stenosis. Aorta: The aortic root and ascending aorta are structurally normal, with no evidence of dilitation. Venous: A systolic blunting flow pattern is recorded from the right upper pulmonary vein. The inferior vena cava is dilated in size with less than 50% respiratory variability, suggesting right atrial pressure of 15 mmHg. IAS/Shunts: No atrial level shunt detected by color flow Doppler.  LEFT VENTRICLE PLAX 2D LVIDd:         6.00 cm LVIDs:         5.00 cm LV PW:         1.40 cm LV IVS:        1.40 cm LVOT diam:     1.90 cm LV SV:         18 LV SV Index:   8 LVOT Area:     2.84 cm  LV Volumes (MOD) LV vol d, MOD A2C: 265.0 ml LV vol d, MOD A4C: 228.0 ml LV vol s, MOD A2C: 243.0 ml LV vol s, MOD A4C: 154.0 ml LV SV MOD A2C:     22.0 ml LV SV MOD A4C:     228.0 ml LV SV MOD BP:      55.0 ml RIGHT VENTRICLE RV Basal diam:  5.50 cm RV S prime:     4.90 cm/s TAPSE (M-mode): 1.2 cm LEFT ATRIUM            Index        RIGHT ATRIUM           Index LA diam:      6.50 cm  2.92 cm/m   RA Area:     35.30 cm LA Vol (A4C): 187.0 ml 83.92 ml/m  RA Volume:   144.00 ml 64.62 ml/m  AORTIC VALVE LVOT  Vmax:   47.00 cm/s LVOT Vmean:  36.700 cm/s LVOT VTI:    0.064 m  AORTA Ao Root diam: 3.20 cm Ao Asc diam:  3.50 cm TRICUSPID VALVE TV Peak grad:   44.1 mmHg TV Vmax:        3.32 m/s TR Peak grad:   44.1 mmHg TR Vmax:        332.00 cm/s  SHUNTS Systemic VTI:  0.06 m Systemic Diam: 1.90 cm Jerel Croitoru MD Electronically signed by Jerel Balding MD Signature Date/Time: 03/11/2024/11:41:05 AM    Final  ASSESSMENT/PLAN:  Assessment: Principal Problem:   Acute hypoxic respiratory failure (HCC) Active Problems:   Essential hypertension   Acute on chronic systolic (congestive) heart failure (HCC)   Type 2 diabetes mellitus with hyperlipidemia (HCC)   AKI (acute kidney injury) (HCC)   Paroxysmal atrial fibrillation with RVR (HCC)   Plan: #Acute hypoxic respiratory failure due to combined systolic and diastolic heart failure exacerbation Presented with shortness of breath and bilateral lower extremity swelling. Previous echo on 08/2023 showed LVEF 20 to 25% with severely decreased LV function, global hypokinesis, moderate LV dilation, concentric LVH, mildly reduced RV function, mildly enlarged RV, biatrial dilation, and possible small PFO. He is responding to IV Lasix  80mg  BID. He has had adequate urine output of net -3L since admission and has lost 2 pounds. He has been on about 2L O2 for the last several hours. Potassium was 3.6 this morning. Will get a lactic acid level to evaluate for low-output heart failure. New echo today showed : The left ventricular function is unchanged. The right ventricular systolic function is significantly worse. Mitral insufficiency is much worse.  Plan: -daily weights -holding BB for now -atorvastatin 20mg  -Lasix  80mg  IV BID with strict ins and outs -TTE scheduled for today -adjusted Digoxin  dose to 0.0625mg  given decreased GFR -wean O2 as tolerated, plan for before discharge -40mEq of Potassium given active diuresis and K of 3.6 -lactic acid -consult  cardiology for worsening heart failure   #Anxiety #Tachycardia Telemetry demonstrated sinus tachycardia, potentially due to his anxiety. Low suspicion for PE given his high INR. Plan -Melatonin 3mg  at bed time -PRN Hydroxyzine 25mg  TID for anxiety  #AKI on CKD stage IIIa -Creatinine increased to 3.29 on admission from baseline of 1.6 in December 2024 -renal U/S showed echogenic kidneys compatible with chronic medical renal disease, new or progressed since 2023. No hydronephrosis or acute renal finding. -may improve with diuresis, likely cardiorenal AKI -Creatinine 2.66 today, improved from 3.18 yesterday  Plan -strict ins and outs -continue diuresis   #Paroxysmal A-fib -recently started warfarin instead of Xarelto , had been taking Xarelto  due to daily dosing and no repeated checks -INR greater than 10 on admission -heparin  levels low -given Vitamin K for anticoagulation reversal -INR this morning was 6.4 -PT 58.8  Plan -daily PT/INR -hold VTE prophylaxis for now -1mg  Vitamin K for anticoagulation reversal per pharmacy   #T2DM -Started on glimeperide 2mg  prior to admission -A1c 10.3 this admission -received 4 units of correctional insulin  yesterday  Plan: -Moderate SSI   #HTN -BP 124/99 this morning -holding BB for now with acute exacerbation, waiting for additional GDMT given kidney function, continue diuresis  Best Practice: Diet: Cardiac diet VTE: Holding due to high INR Code: Full  Disposition planning: Therapy Recs: Pending,  DISPO: Anticipated discharge tomorrow to Home pending improvement of his O2 status and resolution of tachycardia.  Signature:  Melvenia Napoleon [facility]  11:50 AM, 03/11/2024  On Call pager [phone]

## 2024-03-12 DIAGNOSIS — I480 Paroxysmal atrial fibrillation: Secondary | ICD-10-CM

## 2024-03-12 DIAGNOSIS — I5023 Acute on chronic systolic (congestive) heart failure: Secondary | ICD-10-CM

## 2024-03-12 LAB — BASIC METABOLIC PANEL WITH GFR
Anion gap: 10 (ref 5–15)
BUN: 54 mg/dL — ABNORMAL HIGH (ref 6–20)
CO2: 31 mmol/L (ref 22–32)
Calcium: 8.8 mg/dL — ABNORMAL LOW (ref 8.9–10.3)
Chloride: 97 mmol/L — ABNORMAL LOW (ref 98–111)
Creatinine, Ser: 2.47 mg/dL — ABNORMAL HIGH (ref 0.61–1.24)
GFR, Estimated: 32 mL/min — ABNORMAL LOW (ref >60)
Glucose, Bld: 134 mg/dL — ABNORMAL HIGH (ref 70–99)
Potassium: 4 mmol/L (ref 3.5–5.1)
Sodium: 138 mmol/L (ref 135–145)

## 2024-03-12 LAB — MAGNESIUM: Magnesium: 1.9 mg/dL (ref 1.7–2.4)

## 2024-03-12 LAB — CBC
HCT: 46.5 % (ref 39.0–52.0)
Hemoglobin: 14.2 g/dL (ref 13.0–17.0)
MCH: 27.1 pg (ref 26.0–34.0)
MCHC: 30.5 g/dL (ref 30.0–36.0)
MCV: 88.7 fL (ref 80.0–100.0)
Platelets: 225 10*3/uL (ref 150–400)
RBC: 5.24 MIL/uL (ref 4.22–5.81)
RDW: 15 % (ref 11.5–15.5)
WBC: 6.3 10*3/uL (ref 4.0–10.5)
nRBC: 0 % (ref 0.0–0.2)

## 2024-03-12 LAB — GLUCOSE, CAPILLARY
Glucose-Capillary: 111 mg/dL — ABNORMAL HIGH (ref 70–99)
Glucose-Capillary: 137 mg/dL — ABNORMAL HIGH (ref 70–99)
Glucose-Capillary: 134 mg/dL — ABNORMAL HIGH (ref 70–99)
Glucose-Capillary: 155 mg/dL — ABNORMAL HIGH (ref 70–99)

## 2024-03-12 LAB — COOXEMETRY PANEL
Carboxyhemoglobin: 2.6 % — ABNORMAL HIGH (ref 0.5–1.5)
Methemoglobin: <0.7 % (ref 0.0–1.5)
O2 Saturation: 65.9 %
Total hemoglobin: 14.9 g/dL (ref 12.0–16.0)

## 2024-03-12 MED ORDER — ACETAZOLAMIDE 250 MG PO TABS
500.0000 mg | ORAL_TABLET | Freq: Two times a day (BID) | ORAL | Status: AC
  Administered 2024-03-12 (×2): 500 mg via ORAL
  Filled 2024-03-12 (×2): qty 2

## 2024-03-12 MED ORDER — AYR SALINE NASAL NA GEL
1.0000 | Freq: Every day | NASAL | Status: DC | PRN
  Filled 2024-03-12: qty 14.1

## 2024-03-12 MED ORDER — MAGNESIUM SULFATE 2 GM/50ML IV SOLN
2.0000 g | Freq: Once | INTRAVENOUS | Status: AC
  Administered 2024-03-12: 2 g via INTRAVENOUS
  Filled 2024-03-12: qty 50

## 2024-03-12 MED ORDER — HYDRALAZINE HCL 25 MG PO TABS
25.0000 mg | ORAL_TABLET | Freq: Three times a day (TID) | ORAL | Status: DC
  Administered 2024-03-12 (×3): 25 mg via ORAL
  Filled 2024-03-12 (×3): qty 1

## 2024-03-12 MED ORDER — AMIODARONE HCL 200 MG PO TABS
200.0000 mg | ORAL_TABLET | Freq: Two times a day (BID) | ORAL | Status: DC
  Administered 2024-03-12 (×2): 200 mg via ORAL
  Filled 2024-03-12 (×2): qty 1

## 2024-03-12 NOTE — Inpatient Diabetes Management (Signed)
 Inpatient Diabetes Program Recommendations  AACE/ADA: New Consensus Statement on Inpatient Glycemic Control (2015)  Target Ranges:  Prepandial:   less than 140 mg/dL      Peak postprandial:   less than 180 mg/dL (1-2 hours)      Critically ill patients:  140 - 180 mg/dL   Lab Results  Component Value Date   GLUCAP 137 (H) 03/12/2024   HGBA1C 10.3 (H) 03/10/2024    Review of Glycemic Control  Latest Reference Range & Units 03/11/24 06:02 03/11/24 10:49 03/11/24 15:52 03/11/24 18:08 03/11/24 21:18 03/12/24 06:38 03/12/24 11:05  Glucose-Capillary 70 - 99 mg/dL 864 (H) 856 (H) 835 (H) 158 (H) 146 (H) 111 (H) 137 (H)  (H): Data is abnormally high  Diabetes history: DM2 Outpatient Diabetes medications: Jardiance  10 mg, Amaryl  2 mg QD Current orders for Inpatient glycemic control: Novolog  0-15 units TID  Met with patient at bedside using the [employer] Interpreter.  Attempted to confirm home medications but he is unsure what he takes and has been out of his medications for 4-5 days. He is uninsured.  He does not have a glucometer nor a PCP (Internal Medicine is listed as PCP on chart).  Reviewed patient's current A1c of 10.3% (average BG of 255 mg/dL). Explained what a A1c is and what it measures. Also reviewed goal A1c with patient, importance of good glucose control @ home, and blood sugar goals. I will provide him with a [employer] glucometer; explained he can get more strips at [facility] when he runs low.  I asked him to check his glucose fasting and before bedtime.  He does not drink caloric beverages.  Educated on The Plate Method, CHO's, portion control, avoiding caloric beverages, CBGs at home fasting and mid afternoon, F/U with PCP every 3 months, bring meter to PCP office, long and short term complications of uncontrolled BG, and importance of exercise.  Will place [facility] consult for PCP follow up.    Thank you, Wyvonna Pinal, MSN, CDCES Diabetes Coordinator Inpatient Diabetes Program [phone]  (team pager from 8a-5p)

## 2024-03-12 NOTE — Progress Notes (Signed)
 PHARMACY - ANTICOAGULATION CONSULT NOTE  Pharmacy Consult for rivaroxaban /Xarelto  Indication: atrial fibrillation  No Known Allergies  Patient Measurements: Height: 5' 8 (172.7 cm) Weight: 109.9 kg (242 lb 4.6 oz) IBW/kg (Calculated) : 68.4 HEPARIN  DW (KG): 93.8  Vital Signs: Temp: 97.8 F (36.6 C) (07/02 1040) Temp Source: Oral (07/02 1040) BP: 127/85 (07/02 1040) Pulse Rate: 108 (07/02 1040)  Labs: Recent Labs    03/09/24 1842 03/09/24 2059 03/10/24 0042 03/10/24 0222 03/11/24 0251 03/11/24 1935 03/12/24 0423  HGB 14.5  --   --   --  14.5  --  14.2  HCT 47.4  --   --   --  46.8  --  46.5  PLT 269  --   --   --  216  --  225  LABPROT  --   --    < > 85.3* 58.8* 31.1*  --   INR  --   --    < > >10.0* 6.4* 2.8*  --   HEPARINUNFRC  --   --   --  <0.10*  --   --   --   CREATININE 3.29*  --   --  3.18* 2.66* 2.59* 2.47*  TROPONINIHS 40* 44*  --   --   --   --   --    < > = values in this interval not displayed.    Estimated Creatinine Clearance: 45.9 mL/min (A) (by C-G formula based on SCr of 2.47 mg/dL (H)).   Medical History: Past Medical History:  Diagnosis Date   Heart failure with reduced ejection fraction (HCC) 2017   [facility]   Hypertension 2017   [facility]    Medications:  Scheduled:   acetaZOLAMIDE   500 mg Oral BID   amiodarone  200 mg Oral BID   atorvastatin  20 mg Oral Daily   Chlorhexidine  Gluconate Cloth  6 each Topical Daily   digoxin   0.0625 mg Oral Daily   hydrALAZINE   25 mg Oral TID with meals   insulin  aspart  0-15 Units Subcutaneous TID WC   melatonin  3 mg Oral QHS   rivaroxaban   20 mg Oral Q supper   sodium chloride  flush  10-40 mL Intracatheter Q12H    Assessment: Indication>>AFib, Labs evaluated and support use of rivaroxaban , DDI's evaluated.  Goal of Therapy:  Decrease potential for CVA in setting of NVAF by use of rivaroxaban , as approved for this indication.    Plan:  Manufacturer's Patient  Assistance Program document completed at patient bedside through use of interpreter. Document faxed to [employer], manufacturer of rivaroxaban . Patient education materials in Spanish have been provided to the patient.   Lynwood KATHEE Lites, PharmD, CPP Clinical Pharmacist Practitioner 03/12/2024,12:46 PM

## 2024-03-12 NOTE — Progress Notes (Addendum)
 HD#2 SUBJECTIVE:  Patient Summary: Justin [name] is a 43 y.o. with a pertinent PMH of biventricular HFrEF (last EF 20-25% this admission), paroxysmal a-fib on anticoagulation, T2DM, HTN who presented with dyspnea, BLE edema and orthopnea and admitted for heart failure exacerbation.   Overnight Events: PICC line was placed and his co-ox was low so he was started on milrinone  Interim History: He reports he is feeling better today. Patient reports that his breathing is better and that the swelling in his legs is much better today. Patient reports that his sleep was better last night with his melatonin. He has been urinating frequently per his report.  He was seen by the heart failure team who recommended a PICC placement for CVP and co-ox and increased his diuresis to Lasix  120mg  IV BID and added metolazone .   Pharmacy was consulted regarded his anticoagulation and he was started on Xarelto  20mg , his first dose will be today. He was also given another dose of Vitamin K to reverse his high INR.  This visit was interpreted through [place] with the video interpreting service.  OBJECTIVE:  Vital Signs: Vitals:   03/11/24 1552 03/11/24 2009 03/12/24 0046 03/12/24 0409  BP: (!) 142/104 (!) 126/102 (!) 149/114 (!) 146/102  Pulse: (!) 114 (!) 113 (!) 103   Resp: 20 20 20 19   Temp: 97.7 F (36.5 C) 98 F (36.7 C) 97.9 F (36.6 C) 98 F (36.7 C)  TempSrc: Oral Oral Oral Oral  SpO2: 96% 93% 97% 97%  Weight:    109.9 kg  Height:       Supplemental O2: Room Air Spo2 97%  Filed Weights   03/10/24 1549 03/11/24 0420 03/12/24 0409  Weight: 113.3 kg 111.2 kg 109.9 kg     Intake/Output Summary (Last 24 hours) at 03/12/2024 0600 Last data filed at 03/12/2024 0421 Gross per 24 hour  Intake 643.75 ml  Output 3775 ml  Net -3131.25 ml   Net IO Since Admission: -6,451.25 mL [03/12/24 0600]  Physical Exam: Physical Exam Constitutional:      General: He is not in acute  distress.    Appearance: He is not ill-appearing.  Cardiovascular:     Rate and Rhythm: Tachycardia present. Rhythm irregular.     Comments: Edema present over compression bandages  Pulmonary:     Effort: No respiratory distress.     Comments: Crackles at left lung base improved since yesterday but still present. Musculoskeletal:     Right lower leg: 1+ Edema present.     Left lower leg: 1+ Edema present.     Patient Lines/Drains/Airways Status     Active Line/Drains/Airways     Name Placement date Placement time Site Days   PICC Double Lumen 03/11/24 Right Basilic 41 cm 0 cm 03/11/24  8356  -- 1            Pertinent labs and imaging:  Co-oximitrey  Latest Reference Range & Units 03/11/24 19:45 03/12/24 04:23  Total hemoglobin 12.0 - 16.0 g/dL 85.2 85.0  Carboxyhemoglobin 0.5 - 1.5 % 1.6 (H) 2.6 (H)  Methemoglobin 0.0 - 1.5 % 0.7 <0.7  O2 Saturation % 43.9 65.9    Latest Reference Range & Units 03/11/24 19:35  Prothrombin Time 11.4 - 15.2 seconds 31.1 (H)  INR 0.8 - 1.2  2.8 (H)          Latest Ref Rng & Units 03/12/2024    4:23 AM 03/11/2024    2:51 AM 03/09/2024  6:42 PM  CBC  WBC 4.0 - 10.5 K/uL 6.3  6.3  7.0   Hemoglobin 13.0 - 17.0 g/dL 85.7  85.4  85.4   Hematocrit 39.0 - 52.0 % 46.5  46.8  47.4   Platelets 150 - 400 K/uL 225  216  269        Latest Ref Rng & Units 03/12/2024    4:23 AM 03/11/2024    7:35 PM 03/11/2024    2:51 AM  CMP  Glucose 70 - 99 mg/dL 865  864  892   BUN 6 - 20 mg/dL 54  56  53   Creatinine 0.61 - 1.24 mg/dL 7.52  7.40  7.33   Sodium 135 - 145 mmol/L 138  141  139   Potassium 3.5 - 5.1 mmol/L 4.0  4.5  3.6   Chloride 98 - 111 mmol/L 97  100  103   CO2 22 - 32 mmol/L 31  29  28    Calcium  8.9 - 10.3 mg/dL 8.8  8.6  8.5     US  EKG SITE RITE Result Date: 03/11/2024 If Site Rite image not attached, placement could not be confirmed due to current cardiac rhythm.  ECHOCARDIOGRAM COMPLETE Result Date: 03/11/2024    ECHOCARDIOGRAM  REPORT   Patient Name:   Justin [name] Date of Exam: 03/11/2024 Medical Rec #:  [id number]                      Height:       68.0 in Accession #:    [id number]                     Weight:       245.1 lb Date of Birth:  [birth year]                      BSA:          2.228 m Patient Age:    43 years                       BP:           124/99 mmHg Patient Gender: M                              HR:           111 bpm. Exam Location:  Inpatient Procedure: 2D Echo, Cardiac Doppler, Color Doppler and Intracardiac            Opacification Agent (Both Spectral and Color Flow Doppler were            utilized during procedure). Indications:    Dyspnea  History:        Patient has prior history of Echocardiogram examinations.                 Signs/Symptoms:Dyspnea.  Sonographer:    Vella Key Referring Phys: JOSEPH GOODWIN IMPRESSIONS  1. No left ventricular thrombus is seen, but Definity  contrast was not used. Left ventricular ejection fraction, by estimation, is 20 to 25%. The left ventricle has severely decreased function. The left ventricle demonstrates global hypokinesis. The left ventricular internal cavity size was mildly dilated. There is moderate concentric left ventricular hypertrophy. Indeterminate diastolic filling due to E-A fusion.  2. Right ventricular systolic function is severely reduced. The right ventricular size is severely enlarged. There  is moderately elevated pulmonary artery systolic pressure. The estimated right ventricular systolic pressure is 59.1 mmHg.  3. Left atrial size was severely dilated.  4. Right atrial size was severely dilated.  5. The mitral valve is abnormal. Moderate to severe mitral valve regurgitation.  6. Tricuspid valve regurgitation is mild to moderate.  7. The aortic valve is tricuspid. Aortic valve regurgitation is not visualized. No aortic stenosis is present.  8. The inferior vena cava is dilated in size with <50% respiratory variability, suggesting right atrial  pressure of 15 mmHg. Comparison(s): The left ventricular function is unchanged. The right ventricular systolic function is significantly worse. Mitral insufficiency is much worse. FINDINGS  Left Ventricle: No left ventricular thrombus is seen, but Definity  contrast was not used. Left ventricular ejection fraction, by estimation, is 20 to 25%. The left ventricle has severely decreased function. The left ventricle demonstrates global hypokinesis. The left ventricular internal cavity size was mildly dilated. There is moderate concentric left ventricular hypertrophy. Indeterminate diastolic filling due to E-A fusion. Right Ventricle: The right ventricular size is severely enlarged. No increase in right ventricular wall thickness. Right ventricular systolic function is severely reduced. There is moderately elevated pulmonary artery systolic pressure. The tricuspid regurgitant velocity is 3.32 m/s, and with an assumed right atrial pressure of 15 mmHg, the estimated right ventricular systolic pressure is 59.1 mmHg. Left Atrium: Left atrial size was severely dilated. Right Atrium: Right atrial size was severely dilated. Pericardium: Trivial pericardial effusion is present. Mitral Valve: There is mitral leaflet malcoaptation due to posterior leaflet tethering. The mitral valve is abnormal. Moderate to severe mitral valve regurgitation, with eccentric posteriorly directed jet. Tricuspid Valve: The tricuspid valve is normal in structure. Tricuspid valve regurgitation is mild to moderate. Aortic Valve: The aortic valve is tricuspid. Aortic valve regurgitation is not visualized. No aortic stenosis is present. Pulmonic Valve: The pulmonic valve was grossly normal. Pulmonic valve regurgitation is trivial. No evidence of pulmonic stenosis. Aorta: The aortic root and ascending aorta are structurally normal, with no evidence of dilitation. Venous: A systolic blunting flow pattern is recorded from the right upper pulmonary vein. The  inferior vena cava is dilated in size with less than 50% respiratory variability, suggesting right atrial pressure of 15 mmHg. IAS/Shunts: No atrial level shunt detected by color flow Doppler.  LEFT VENTRICLE PLAX 2D LVIDd:         6.00 cm LVIDs:         5.00 cm LV PW:         1.40 cm LV IVS:        1.40 cm LVOT diam:     1.90 cm LV SV:         18 LV SV Index:   8 LVOT Area:     2.84 cm  LV Volumes (MOD) LV vol d, MOD A2C: 265.0 ml LV vol d, MOD A4C: 228.0 ml LV vol s, MOD A2C: 243.0 ml LV vol s, MOD A4C: 154.0 ml LV SV MOD A2C:     22.0 ml LV SV MOD A4C:     228.0 ml LV SV MOD BP:      55.0 ml RIGHT VENTRICLE RV Basal diam:  5.50 cm RV S prime:     4.90 cm/s TAPSE (M-mode): 1.2 cm LEFT ATRIUM            Index        RIGHT ATRIUM           Index LA diam:  6.50 cm  2.92 cm/m   RA Area:     35.30 cm LA Vol (A4C): 187.0 ml 83.92 ml/m  RA Volume:   144.00 ml 64.62 ml/m  AORTIC VALVE LVOT Vmax:   47.00 cm/s LVOT Vmean:  36.700 cm/s LVOT VTI:    0.064 m  AORTA Ao Root diam: 3.20 cm Ao Asc diam:  3.50 cm TRICUSPID VALVE TV Peak grad:   44.1 mmHg TV Vmax:        3.32 m/s TR Peak grad:   44.1 mmHg TR Vmax:        332.00 cm/s  SHUNTS Systemic VTI:  0.06 m Systemic Diam: 1.90 cm Mihai Croitoru MD Electronically signed by Jerel Balding MD Signature Date/Time: 03/11/2024/11:41:05 AM    Final     ASSESSMENT/PLAN:  Assessment: Principal Problem:   Acute hypoxic respiratory failure (HCC) Active Problems:   Essential hypertension   Acute on chronic systolic (congestive) heart failure (HCC)   Type 2 diabetes mellitus with hyperlipidemia (HCC)   AKI (acute kidney injury) (HCC)   Paroxysmal atrial fibrillation with RVR (HCC)   Plan: #Acute hypoxic respiratory failure due to combined systolic and diastolic heart failure exacerbation, low output state Presented with shortness of breath and bilateral lower extremity swelling. Lactic acid normal. Has been on room air for several hours. New echo on 7/1 showed  worsening right ventricular systolic function and worsening mitral insufficiency. His diuresis was increased to lasix  120mg  IV BID + metolazone  with increased urine output, down 3L net yesterday. Weight is down 1.3 kgs from yesterday. Heart failure team is onboard. He had a PICC placed and an venous O2 saturation of 43.9 which improved to 65.9 after administration of Milrinone. This is likely reflective of a low-output state. He has multiple social barriers including poor social support and lack of insurance.   Plan: -daily weights -holding BB for now -atorvastatin 20mg  -Lasix  120mg  IV BID + acetazolamide  bid, strict ins and outs -adjusted Digoxin  dose to 0.0625mg  given decreased GFR -Milrinone 0.25 mcg/kg/min  68.4 kg Ideal weight (5.13 mL/hr), wean to 0.16mcg in the next two days per cardiology. - Afterload reduction with hydralizine 25mg  TID per cardiology -wean O2 as tolerated -heart failure team on board, appreciate recs.  #Paroxysmal A-fib -recently started warfarin instead of Xarelto , had previously been taking Xarelto  due to daily dosing and no repeated checks. INR greater than 10 on admission, INR after vitamin K 2.8. Given Vitamin K for anticoagulation reversal x2.    Plan -start Xarelto  20mg  while inpatient and pharmacy is consulted for management and financial assistance -amiodarone 200 mg bid   #Anxiety #Insomnia #Tachycardia Telemetry demonstrated sinus tachycardia and the patient mentions his anxiety. Low suspicion for PE given his high INR. He took melatonin last night with significant benefit in insomnia.  Plan -Melatonin 3mg  at bed time -PRN Hydroxyzine 25mg  TID for anxiety   #AKI on CKD stage IIIa -Creatinine increased to 3.29 on admission from baseline of 1.6 in December 2024 -renal U/S showed echogenic kidneys compatible with chronic medical renal disease, new or progressed since 2023. No hydronephrosis or acute renal finding. -likely cardiorenal AKI -Creatinine  2.47 today, improved from 2.59 yesterday   Plan -strict ins and outs -continue diuresis as above    #T2DM -Started on glimeperide 2mg  prior to admission -A1c 10.3 this admission -received 7 units of correctional insulin  yesterday -may need to adjust medication prior to discharge   Plan: -Moderate SSI   #HTN -BP 146/102 -holding BB for now  with acute exacerbation, waiting for additional GDMT given kidney function, continue diuresis  Best Practice: Diet: Cardiac diet VTE: Place TED hose Start: 03/11/24 1232 Code: Full  Disposition planning: Therapy Recs: SNF, DISPO: Anticipated discharge in 2-3 days to home pending further diuresis social barriers to affordability of medications.  Signature:  Melvenia Napoleon [facility]  6:00 AM, 03/12/2024  On Call pager [phone]

## 2024-03-12 NOTE — Discharge Instructions (Addendum)
 Outpatient Substance Abuse  Treatment- uninsured  Narcotics Anonymous 24-HOUR HELPLINE Pre-recorded for Meeting Schedules [facility] [phone]  [email or web address] ALCOHOLICS ANONYMOUS  [name]   Answering Service [phone] Please Note: All [place] Meetings are Non-smoking [email or web address]  Alcohol and Drug Services -  Insurance: Medicaid /State funding/private insurance Methadone, suboxone/Intensive outpatient  [place]   [phone] Fax: [phone] [address], [place], [place], [address] [place] [phone] Fax: [phone]    [address], [place], [place], [address] ([address] [place], [place], [place], [place], [place], [place], [place], [place]) [facility] [email or web address] Accepts State funding/Medicaid Transitional housing, Intensive Outpatient Treatment, Outpatient treatment, Veterans Services  Phone: [phone] Fax: [phone] Address: [address], [place] [address]   [facility] ([email or web address] Insurance: Medicaid Case Management, Administrator, arts, Medication Management, Outpatient Therapy, Psychosocial Rehabilitation, Substance Abuse Intensive Outpatient  Phone: [phone] Fax: [phone] [address] Justin [name] Justin [name] Justin [name], [place], [place], [address]  [facility] Medicaid, most private insurance providers Types of Program: Individual/Group Therapy, Substance Abuse Treatment  Phone: [place] [phone] Fax: [phone] [address], [place], [place], [address] [place] [phone] [address], [address] Justin [name], [place], [address]  [name] [name]  Medicaid Types of Program: [facility]OP  Phone: [phone] Fax: [phone] [address] [place], [place], [place], [address] [facility] Medicaid/state funds Crisis line [phone] [place] [phone] [place] [phone]  [place] [phone]  [facility] [address];  [place], [place] [address] [phone]  Substance Abuse Intensive Outpatient Program [facility] [address] [place], [place] [address] [phone]- Substance abuse treatment  Successful Transitions  Insurance: [name], [address], sliding scale Types of Program: substance abuse treatment, transportation assistance Phone: [phone] Fax: [phone] Address: [address], [place] [address] [name] ([email or web address]) Insurance: UHC, [place], [place], [place] of [place] Program: addiction counseling, detoxification,  Phone: [phone]  Fax: [phone] Address: [address], [place] [address]  Justin [name] (statewide facilities/programs) [address] (Medicaid/state funds) [place], [place] [address]                      [email or web address] [phone] Justin [name]- [phone] [place]- [phone] [facility] [place] (2 Locations) (Medicaid/state funds) --[address]  walk in 8:30-12 and 1-2:30 [place], [id number]   Justin [name]- [phone] --[address] [place], [place] [address]  [phone] walk in 8:30-12 and 2-3:30  [name] state funds/medicaid [address] [place], [place] [address] [phone] [facility] (Suboxone clinic) Medicaid/state funds  [address]  [place], [place] [address] [phone]   [facility]  [name] [place], [place] [address]  [phone] (24 hours) [place]- [address] [place], [place] [address]  [phone] (24 hours) [place]- [address] Justin [name] [place]- [address] [place] [phone] Justin [name] [address] Justin [name] [phone] [facility]- Medicaid and state funds  [place]- [address] [place], [place] [address] [phone] (24 hours) [place]- [address] [place],  [place] [address] [phone] [facility] [name]- [address] [name] [address] [place], [place] [address] [phone] (24 hours) [place] [address]  [address] [place], [place]  [address] [phone] [place]- [address] [place] [phone]   [facility] Justin [name] por permitirnos ser parte de su atencin. Estuvo hospitalizado por insuficiencia cardaca. Le tratamos con pastillas de lquidos, cardioversin y medicamentos para fortalecer su corazn.  Por favor, tome Amiodarona 200 mg, dos pastillas dos veces al da hasta el 14/7, luego 200 mg, una pastilla dos veces al da durante una semana hasta el 22/7, luego 200 mg una vez al da el 22/7 hasta que vea a un cardilogo.  CITA DE SEGUIMIENTO: Coordinamos una cita de seguimiento con su mdico de cabecera el 17 de julio a las 15:30 h. [facility] Cardi[facility] el 14 de julio a las 15:40 h.  Nos alegra que se sienta mejor.  Justin [name] [facility] [place]

## 2024-03-12 NOTE — Progress Notes (Signed)
 Advanced Heart Failure Rounding Note  Cardiologist: Shelda Bruckner, MD  Chief Complaint: A/C HFrEF Subjective:    Coox 66. CVP 13 on Milrinone 0.25 mcg/kg/min 4.1L UOP (net - 3.5L) with IV Lasix  + metolazone .  BP elevated, SBP 140s.   Standing at bedside. Feeling better this morning. No SOB.   Objective:    Weight Range: 109.9 kg Body mass index is 36.84 kg/m.   Vital Signs:   Temp:  [97.7 F (36.5 C)-98 F (36.7 C)] 98 F (36.7 C) (07/02 0711) Pulse Rate:  [103-114] 114 (07/02 0711) Resp:  [19-20] 20 (07/02 0711) BP: (108-149)/(77-114) 144/105 (07/02 0711) SpO2:  [93 %-97 %] 96 % (07/02 0711) Weight:  [109.9 kg] 109.9 kg (07/02 0409) Last BM Date :  (PTA)  Weight change: Filed Weights   03/10/24 1549 03/11/24 0420 03/12/24 0409  Weight: 113.3 kg 111.2 kg 109.9 kg   Intake/Output:  Intake/Output Summary (Last 24 hours) at 03/12/2024 0801 Last data filed at 03/12/2024 9362 Gross per 24 hour  Intake 643.75 ml  Output 4150 ml  Net -3506.25 ml    Physical Exam    CVP 13 General: Obese appearing. No distress on RA Cardiac: JVP ~10cm. S1 and S2 present. No murmurs or rub. Abdomen: Obese, soft, non-tender, non-distended.  Extremities: Warm and dry.  1+ BLE edema.  Neuro: Alert and oriented x3. Affect pleasant. Moves all extremities without difficulty. Lines/Devices:  RUE PICC  Telemetry   ST in 110s (personally reviewed)  EKG    No new EKG to review  Labs    CBC Recent Labs    03/11/24 0251 03/12/24 0423  WBC 6.3 6.3  HGB 14.5 14.2  HCT 46.8 46.5  MCV 88.8 88.7  PLT 216 225   Basic Metabolic Panel Recent Labs    92/98/74 0251 03/11/24 1935 03/12/24 0423  NA 139 141 138  K 3.6 4.5 4.0  CL 103 100 97*  CO2 28 29 31   GLUCOSE 107* 135* 134*  BUN 53* 56* 54*  CREATININE 2.66* 2.59* 2.47*  CALCIUM  8.5* 8.6* 8.8*  MG 2.0  --  1.9   Liver Function Tests Recent Labs    03/09/24 2059  AST 36  ALT 30  ALKPHOS 89  BILITOT 1.0   PROT 6.3*  ALBUMIN 2.8*   Recent Labs    03/09/24 2059  LIPASE 56*   BNP (last 3 results) Recent Labs    08/22/23 0722 03/09/24 1845  BNP 951.5* 2,578.5*   Hemoglobin A1C Recent Labs    03/10/24 0042  HGBA1C 10.3*   Fasting Lipid Panel Recent Labs    03/10/24 0222  CHOL 73  HDL 24*  LDLCALC 30  TRIG 93  CHOLHDL 3.0   Medications:    Scheduled Medications:  amiodarone  200 mg Oral Daily   atorvastatin  20 mg Oral Daily   Chlorhexidine  Gluconate Cloth  6 each Topical Daily   digoxin   0.0625 mg Oral Daily   insulin  aspart  0-15 Units Subcutaneous TID WC   melatonin  3 mg Oral QHS   rivaroxaban   20 mg Oral Q supper   sodium chloride  flush  10-40 mL Intracatheter Q12H   Infusions:  furosemide  120 mg (03/11/24 2031)   magnesium  sulfate bolus IVPB     milrinone 0.25 mcg/kg/min (03/12/24 0100)   PRN Medications: hydrOXYzine, mouth rinse, sodium chloride  flush   Patient Profile   Justin [name] is a 43 y.o. Spanish-speaking male with history of chronic systolic  CHF, hx uncontrolled HTN, DM II, mitral regurgitation, ETOH abuse, hx crack/cocaine use. AHF team to see for A/C combined systolic and diastolic heart failure   Assessment/Plan   1. Acute on chronic systolic CHF: Long history of nonischemic cardiomyopathy. Cath in 6/23 with no significant CAD, CI 1.99.  Substance abuse (cocaine and ETOH) thought to play a role in cardiomyopathy, also uncontrolled HTN in the past.  However, cardiac MRI was done in 6/23 showing LVEF 29%, LV severely dilated, asymmetric LVH with basal septum measuring 17 mm, RVEF 45%, patchy LGE in septum and RV insertion sites + subepicardial LGE in inferolateral wall. Scar pattern and asymmetric hypertrophy could be consistent with hypertrophic cardiomyopathy. However, due to subepicardial LGE in inferolateral wall and presence of mediastinal lymphadenopathy, cardiac PET recommended to rule out cardiac sarcoidosis.  This was  never done due to lack of insurance.  Echo this admission showed EF 20-25% with moderate concentric LVH, severe RV dysfunction, probably severe functional MR with PISA ERO 0.47 cm^2, IVC dilated.  Patient was admitted with marked volume overload, NYHA class III symptoms and cardiorenal syndrome with creatinine 3.29 (uncertain of current baseline).  Lactate 1.8, but cool extremities do concern me for low output HF. Creatinine has improved to 2.47 with diuresis.  - coox initially 43.9. Started on Milrinone 0.25 mcg/kg/min, now 66%. - continue digoxin  0.0625 mg daily, as long as Cr trends down, plan to stop if Cr rises. - GDMT will be limited by AKI. Previously on midodrine durin prior hospitalization in [place] , however not hypertensive. - start hydral 25 mg tid - continue Lasix  120 mg bid + diamox  500 mg bid  - not a good candidate for advanced therapies with noncompliance, lack of insurance, RV dysfunction and AKI.  2. Atrial fibrillation: Paroxysmal.  In NSR currently.  He was started on warfarin at recent admission in [place]  but has not been having his INR checked so INR was > 10 at admission.  Now down to 2.8 - Will discuss warfarin timing vs. Option for DOAC with PharmD - Increase PO amio to 200 mg bid with milrinone 3. ?AKI on CKD stage 3: Suspect cardiorenal syndrome.  Last creatinine here in 12/24 was 1.6.  Creatinine 3.29 at admission, down to 2.47 with diuresis.  - Continue diuresis with inotrope, hopefully creatinine will continue to trend down 4. Mitral regurgitation: Severe functional MR on echo this admission.  - Continue aggressive diuresis, will recheck after diuresis and titration of GDMT.  5. ETOH/cocaine abuse: UDS negative this admission. Says he has quit.  6. Uncontrolled DM2: HgbA1c 10.3, per primary team.  7. Noncompliance: Has not followed up in HF clinic and generally not taking medications.  Does not have insurance.  - TOC consult placed; need SW assistance  Length of Stay:  2  [place] Vickie Ponds, NP  03/12/2024, 8:01 AM  Advanced Heart Failure Team Pager [phone] (M-F; 7a - 5p)  Please contact [facility] for night-coverage after hours (5p -7a ) and weekends on [email or web address]

## 2024-03-13 ENCOUNTER — Telehealth: Payer: Self-pay

## 2024-03-13 ENCOUNTER — Other Ambulatory Visit (HOSPITAL_COMMUNITY): Payer: Self-pay

## 2024-03-13 DIAGNOSIS — N183 Chronic kidney disease, stage 3 unspecified: Secondary | ICD-10-CM

## 2024-03-13 DIAGNOSIS — R000 Tachycardia, unspecified: Secondary | ICD-10-CM

## 2024-03-13 LAB — CBC
HCT: 51.5 % (ref 39.0–52.0)
Hemoglobin: 16.3 g/dL (ref 13.0–17.0)
MCH: 27.8 pg (ref 26.0–34.0)
MCHC: 31.7 g/dL (ref 30.0–36.0)
MCV: 87.9 fL (ref 80.0–100.0)
Platelets: 218 10*3/uL (ref 150–400)
RBC: 5.86 MIL/uL — ABNORMAL HIGH (ref 4.22–5.81)
RDW: 14.8 % (ref 11.5–15.5)
WBC: 7.5 10*3/uL (ref 4.0–10.5)
nRBC: 0 % (ref 0.0–0.2)

## 2024-03-13 LAB — BASIC METABOLIC PANEL WITH GFR
Anion gap: 13 (ref 5–15)
Anion gap: 17 — ABNORMAL HIGH (ref 5–15)
BUN: 55 mg/dL — ABNORMAL HIGH (ref 6–20)
BUN: 57 mg/dL — ABNORMAL HIGH (ref 6–20)
CO2: 28 mmol/L (ref 22–32)
CO2: 29 mmol/L (ref 22–32)
Calcium: 9.1 mg/dL (ref 8.9–10.3)
Calcium: 9.4 mg/dL (ref 8.9–10.3)
Chloride: 90 mmol/L — ABNORMAL LOW (ref 98–111)
Chloride: 91 mmol/L — ABNORMAL LOW (ref 98–111)
Creatinine, Ser: 2.58 mg/dL — ABNORMAL HIGH (ref 0.61–1.24)
Creatinine, Ser: 2.84 mg/dL — ABNORMAL HIGH (ref 0.61–1.24)
GFR, Estimated: 31 mL/min — ABNORMAL LOW (ref >60)
GFR, Estimated: 27 mL/min — ABNORMAL LOW (ref >60)
Glucose, Bld: 279 mg/dL — ABNORMAL HIGH (ref 70–99)
Glucose, Bld: 167 mg/dL — ABNORMAL HIGH (ref 70–99)
Potassium: 3.3 mmol/L — ABNORMAL LOW (ref 3.5–5.1)
Potassium: 3.7 mmol/L (ref 3.5–5.1)
Sodium: 131 mmol/L — ABNORMAL LOW (ref 135–145)
Sodium: 137 mmol/L (ref 135–145)

## 2024-03-13 LAB — GLUCOSE, CAPILLARY
Glucose-Capillary: 158 mg/dL — ABNORMAL HIGH (ref 70–99)
Glucose-Capillary: 175 mg/dL — ABNORMAL HIGH (ref 70–99)
Glucose-Capillary: 193 mg/dL — ABNORMAL HIGH (ref 70–99)
Glucose-Capillary: 160 mg/dL — ABNORMAL HIGH (ref 70–99)

## 2024-03-13 LAB — COOXEMETRY PANEL
Carboxyhemoglobin: 2.5 % — ABNORMAL HIGH (ref 0.5–1.5)
Carboxyhemoglobin: 2.2 % — ABNORMAL HIGH (ref 0.5–1.5)
Methemoglobin: <0.7 % (ref 0.0–1.5)
Methemoglobin: <0.7 % (ref 0.0–1.5)
O2 Saturation: 65.2 %
O2 Saturation: 55 %
Total hemoglobin: 16.6 g/dL — ABNORMAL HIGH (ref 12.0–16.0)
Total hemoglobin: 17.1 g/dL — ABNORMAL HIGH (ref 12.0–16.0)

## 2024-03-13 LAB — PROTIME-INR
INR: 4.3 (ref 0.8–1.2)
Prothrombin Time: 43.4 s — ABNORMAL HIGH (ref 11.4–15.2)

## 2024-03-13 LAB — MAGNESIUM: Magnesium: 2.1 mg/dL (ref 1.7–2.4)

## 2024-03-13 LAB — APTT: aPTT: 52 s — ABNORMAL HIGH (ref 24–36)

## 2024-03-13 MED ORDER — FUROSEMIDE 10 MG/ML IJ SOLN
120.0000 mg | Freq: Two times a day (BID) | INTRAVENOUS | Status: DC
  Administered 2024-03-13: 120 mg via INTRAVENOUS
  Filled 2024-03-13: qty 12
  Filled 2024-03-13: qty 120
  Filled 2024-03-13: qty 12

## 2024-03-13 MED ORDER — HYDRALAZINE HCL 50 MG PO TABS
50.0000 mg | ORAL_TABLET | Freq: Three times a day (TID) | ORAL | 12 refills | Status: DC
  Filled 2024-03-13: qty 90, 30d supply, fill #0

## 2024-03-13 MED ORDER — HYDROMORPHONE HCL 2 MG PO TABS: 1.0000 mg | ORAL_TABLET | Freq: Four times a day (QID) | ORAL | Status: DC | PRN

## 2024-03-13 MED ORDER — ACETAMINOPHEN 500 MG PO TABS
1000.0000 mg | ORAL_TABLET | Freq: Three times a day (TID) | ORAL | Status: DC
  Administered 2024-03-13 – 2024-03-19 (×11): 1000 mg via ORAL
  Filled 2024-03-13 (×16): qty 2

## 2024-03-13 MED ORDER — EMPAGLIFLOZIN 10 MG PO TABS
10.0000 mg | ORAL_TABLET | Freq: Every day | ORAL | Status: DC
  Administered 2024-03-13: 10 mg via ORAL
  Filled 2024-03-13: qty 1

## 2024-03-13 MED ORDER — ISOSORBIDE MONONITRATE ER 30 MG PO TB24
30.0000 mg | ORAL_TABLET | Freq: Every day | ORAL | Status: DC
  Administered 2024-03-13 – 2024-03-16 (×4): 30 mg via ORAL
  Filled 2024-03-13 (×4): qty 1

## 2024-03-13 MED ORDER — RIVAROXABAN 20 MG PO TABS
20.0000 mg | ORAL_TABLET | Freq: Every day | ORAL | 12 refills | Status: DC
  Filled 2024-03-13: qty 30, 30d supply, fill #0

## 2024-03-13 MED ORDER — AMIODARONE HCL 200 MG PO TABS
400.0000 mg | ORAL_TABLET | Freq: Two times a day (BID) | ORAL | 12 refills | Status: DC
  Filled 2024-03-13: qty 120, 30d supply, fill #0

## 2024-03-13 MED ORDER — HYDRALAZINE HCL 50 MG PO TABS
50.0000 mg | ORAL_TABLET | Freq: Three times a day (TID) | ORAL | Status: DC
  Administered 2024-03-13 – 2024-03-16 (×11): 50 mg via ORAL
  Filled 2024-03-13 (×12): qty 1

## 2024-03-13 MED ORDER — AMIODARONE HCL IN DEXTROSE 360-4.14 MG/200ML-% IV SOLN
60.0000 mg/h | INTRAVENOUS | Status: AC
  Administered 2024-03-13 (×2): 60 mg/h via INTRAVENOUS
  Filled 2024-03-13 (×2): qty 200

## 2024-03-13 MED ORDER — AMIODARONE HCL IN DEXTROSE 360-4.14 MG/200ML-% IV SOLN
30.0000 mg/h | INTRAVENOUS | Status: DC
  Administered 2024-03-14 – 2024-03-17 (×8): 30 mg/h via INTRAVENOUS
  Filled 2024-03-13 (×10): qty 200

## 2024-03-13 MED ORDER — POTASSIUM CHLORIDE CRYS ER 20 MEQ PO TBCR
40.0000 meq | EXTENDED_RELEASE_TABLET | Freq: Once | ORAL | Status: AC
  Administered 2024-03-13: 40 meq via ORAL
  Filled 2024-03-13: qty 2

## 2024-03-13 MED ORDER — TORSEMIDE 20 MG PO TABS
40.0000 mg | ORAL_TABLET | Freq: Every day | ORAL | 12 refills | Status: DC
  Filled 2024-03-13: qty 60, 30d supply, fill #0

## 2024-03-13 MED ORDER — ACETAZOLAMIDE 250 MG PO TABS
500.0000 mg | ORAL_TABLET | Freq: Once | ORAL | Status: AC
  Administered 2024-03-13: 500 mg via ORAL
  Filled 2024-03-13: qty 2

## 2024-03-13 MED ORDER — ISOSORBIDE MONONITRATE ER 30 MG PO TB24
30.0000 mg | ORAL_TABLET | Freq: Every day | ORAL | 12 refills | Status: DC
  Filled 2024-03-13: qty 30, 30d supply, fill #0

## 2024-03-13 MED ORDER — POTASSIUM CHLORIDE CRYS ER 20 MEQ PO TBCR
40.0000 meq | EXTENDED_RELEASE_TABLET | Freq: Once | ORAL | Status: DC
Start: 2024-03-13 — End: 2024-03-13

## 2024-03-13 MED ORDER — ATORVASTATIN CALCIUM 20 MG PO TABS
20.0000 mg | ORAL_TABLET | Freq: Every day | ORAL | 12 refills | Status: DC
  Filled 2024-03-13: qty 30, 30d supply, fill #0

## 2024-03-13 MED ORDER — AMIODARONE IV BOLUS ONLY 150 MG/100ML
150.0000 mg | Freq: Once | INTRAVENOUS | Status: AC
  Filled 2024-03-13: qty 100

## 2024-03-13 MED ORDER — POTASSIUM CHLORIDE CRYS ER 20 MEQ PO TBCR
40.0000 meq | EXTENDED_RELEASE_TABLET | ORAL | Status: AC
  Administered 2024-03-13 (×2): 40 meq via ORAL
  Filled 2024-03-13 (×2): qty 2

## 2024-03-13 MED ORDER — AMIODARONE LOAD VIA INFUSION
150.0000 mg | Freq: Once | INTRAVENOUS | Status: AC
  Administered 2024-03-13: 150 mg via INTRAVENOUS
  Filled 2024-03-13: qty 83.34

## 2024-03-13 MED ORDER — HYDRALAZINE HCL 50 MG PO TABS: 50.0000 mg | ORAL_TABLET | Freq: Three times a day (TID) | ORAL | Status: DC

## 2024-03-13 NOTE — Plan of Care (Signed)
  Problem: Fluid Volume: Goal: Ability to maintain a balanced intake and output will improve Outcome: Progressing   Problem: Health Behavior/Discharge Planning: Goal: Ability to manage health-related needs will improve Outcome: Progressing   Problem: Tissue Perfusion: Goal: Adequacy of tissue perfusion will improve Outcome: Progressing

## 2024-03-13 NOTE — Progress Notes (Addendum)
 HD#3 SUBJECTIVE:  Patient Summary: Justin [name] is a 43 y.o. with a pertinent PMH of biventricular HFrEF (last EF 20-25% this admission), paroxysmal a-fib on anticoagulation, T2DM, HTN who presented with dyspnea, BLE edema and orthopnea and admitted for heart failure exacerbation.   Overnight Events: None  Interim History: Patient reports that he is doing better. Patient reports that breathing is doing better and that the swelling in his legs is decreasing. Patient reports that he slept well last night. Patient endorses that he has been urinating frequently. Patient reports that he can get up and walk but feels that he is attached to a lot of things and is hesitant to move around because of that.   OBJECTIVE:  Vital Signs: Vitals:   03/12/24 1932 03/12/24 2300 03/13/24 0549 03/13/24 0730  BP: 110/81 (!) 140/107 (!) 120/98 (!) 122/99  Pulse: (!) 120 (!) 120 (!) 121 (!) 120  Resp: 20 20 20 20   Temp: 98.1 F (36.7 C) 98 F (36.7 C) 98.1 F (36.7 C) 98.2 F (36.8 C)  TempSrc: Oral Oral Oral Oral  SpO2: 93% 97%  94%  Weight:   105 kg   Height:       Supplemental O2: Nasal Cannula SpO2: 94 % O2 Flow Rate (L/min): 2 L/min  Filed Weights   03/11/24 0420 03/12/24 0409 03/13/24 0549  Weight: 111.2 kg 109.9 kg 105 kg     Intake/Output Summary (Last 24 hours) at 03/13/2024 0743 Last data filed at 03/13/2024 9340 Gross per 24 hour  Intake 699.91 ml  Output 8926 ml  Net -8226.09 ml   Net IO Since Admission: -15,152.34 mL [03/13/24 0743]  Physical Exam: Physical Exam Constitutional:      General: He is not in acute distress.    Appearance: He is not ill-appearing.  Cardiovascular:     Rate and Rhythm: Tachycardia present.  Pulmonary:     Breath sounds: Normal breath sounds.  Musculoskeletal:     Right lower leg: No edema.     Left lower leg: No edema.  Skin:    General: Skin is warm.  Neurological:     Mental Status: He is alert.     Patient  Lines/Drains/Airways Status     Active Line/Drains/Airways     Name Placement date Placement time Site Days   PICC Double Lumen 03/11/24 Right Basilic 41 cm 0 cm 03/11/24  8356  -- 2            Pertinent labs and imaging:     Latest Ref Rng & Units 03/13/2024    5:55 AM 03/12/2024    4:23 AM 03/11/2024    2:51 AM  CBC  WBC 4.0 - 10.5 K/uL 7.5  6.3  6.3   Hemoglobin 13.0 - 17.0 g/dL 83.6  85.7  85.4   Hematocrit 39.0 - 52.0 % 51.5  46.5  46.8   Platelets 150 - 400 K/uL 218  225  216        Latest Ref Rng & Units 03/13/2024    5:55 AM 03/12/2024    4:23 AM 03/11/2024    7:35 PM  CMP  Glucose 70 - 99 mg/dL 720  865  864   BUN 6 - 20 mg/dL 55  54  56   Creatinine 0.61 - 1.24 mg/dL 7.41  7.52  7.40   Sodium 135 - 145 mmol/L 131  138  141   Potassium 3.5 - 5.1 mmol/L 3.3  4.0  4.5  Chloride 98 - 111 mmol/L 90  97  100   CO2 22 - 32 mmol/L 28  31  29    Calcium  8.9 - 10.3 mg/dL 9.1  8.8  8.6     Latest Reference Range & Units 03/13/24 05:55  Total hemoglobin 12.0 - 16.0 g/dL 83.3 (H)  Carboxyhemoglobin 0.5 - 1.5 % 2.5 (H)  Methemoglobin 0.0 - 1.5 % <0.7  O2 Saturation % 65.2  (H): Data is abnormally high No results found.  ASSESSMENT/PLAN:  Assessment: Principal Problem:   Acute hypoxic respiratory failure (HCC) Active Problems:   Essential hypertension   Acute on chronic systolic (congestive) heart failure (HCC)   Type 2 diabetes mellitus with hyperlipidemia (HCC)   AKI (acute kidney injury) (HCC)   Paroxysmal atrial fibrillation with RVR (HCC)   Plan: #Acute hypoxic respiratory failure due to combined systolic and diastolic heart failure exacerbation, low output state  Presented with shortness of breath and bilateral lower extremity swelling. Lactic acid normal. New echo on 7/1 showed worsening right ventricular systolic function and worsening mitral insufficiency. Heart failure team is onboard. He had a PICC placed and an venous O2 saturation of 43.9 which improved to  65.9 after administration of Milrinone. This is likely reflective of a low-output state. He has multiple social barriers including poor social support and lack of insurance. Down 8L since yesterday. Weight trending down. His crackles and lower extremity swelling have improved.   Plan: -daily weights -holding BB for now -atorvastatin 20mg  -Hold Lasix  and Acetazolamide  given increase in kidney function -D/C digoxin  -Milrinone 0.25 mcg/kg/min  68.4 kg Ideal weight (5.13 mL/hr), wean to 0.163mcg in the next two days per cardiology. - Afterload reduction with hydralizine 50mg  TID per cardiology -wean O2 as tolerated -heart failure team on board, appreciate recs. -Imdur  30mg  daily  #Hypokalemia K was 3.3 this morning down from 4.0 yesterday and Creatinine increased to 2.58. - K given -PM BMP   #Paroxysmal A-fib -recently started warfarin instead of Xarelto , had previously been taking Xarelto  due to daily dosing and no repeated checks. INR greater than 10 on admission, INR after vitamin K 2.8. Given Vitamin K for anticoagulation reversal x2.    Plan -started on Xarelto  20mg  while inpatient and pharmacy is consulted for management and financial assistance, paperwork signed and sent to [employer] for financial assistance. -amiodarone 200 mg bid   #Anxiety #Insomnia #Tachycardia Telemetry demonstrated sinus tachycardia and the patient mentions his anxiety. Low suspicion for PE given his high INR. He started melatonin with significant benefit in insomnia.  Plan -Melatonin 3mg  at bed time -PRN Hydroxyzine 25mg  TID for anxiety   #AKI on CKD stage IIIa -Creatinine increased to 3.29 on admission from baseline of 1.6 in December 2024 -renal U/S showed echogenic kidneys compatible with chronic medical renal disease, new or progressed since 2023. No hydronephrosis or acute renal finding. -likely cardiorenal AKI -Creatinine 2.58 today, worsened from 2.47 yesterday   Plan -strict ins and  outs -scale back diuresis      #T2DM -Started on glimeperide 2mg  prior to admission -A1c 10.3 this admission -received 6 units of correctional insulin  yesterday -may need to adjust medication prior to discharge   Plan: -Moderate SSI   #HTN -BP elevated -holding BB for now with acute exacerbation, waiting for additional GDMT given kidney function, continue diuresis -Hydralizine and Imdur   Best Practice: Diet: Cardiac diet VTE: Place TED hose Start: 03/11/24 1232 Code: Full  Disposition planning: Therapy Recs: SNF,  DISPO: Anticipated discharge in 2-3 days  to Skilled nursing facility pending further medical care.  Signature:  Melvenia Napoleon [facility]  7:43 AM, 03/13/2024  On Call pager [phone]

## 2024-03-13 NOTE — TOC CM/SW Note (Addendum)
  [employer] MEDICATION ASSISTANCE CARD Pharmacies please call [phone] for claim processing assistance.  Rx BIN: [id number] Rx Group: [id number] Rx PCN: [id number] Relationship Code: 1 Person Code: 01  Patient ID (MRN): MOSES [id number]    Patient Name: Justin [name]   Patient DOB: 06-Dec-1980   Discharge Date:03/14/2024  Expiration Date: (must be filled within 7 days of discharge)

## 2024-03-13 NOTE — Progress Notes (Signed)
 Critical lab value of INR of 4.3 made aware to Dr. Ronnald Sergeant.

## 2024-03-13 NOTE — Progress Notes (Signed)
 Placing HF meds for TOC to obtain  via [employer]. I will update TOC CM. Discussed with Sabharwal.   Heart Failure Meds for Discharge Torsemide  40 mg daily  Amiodarone 400 mg daily  Hydralazine  50 mg three times a day Imdur  30 daily  Xarelto  20 mg daily   Deneene Tarver NP-C  1:42 PM

## 2024-03-13 NOTE — Inpatient Diabetes Management (Signed)
 Inpatient Diabetes Program Recommendations  AACE/ADA: New Consensus Statement on Inpatient Glycemic Control (2015)  Target Ranges:  Prepandial:   less than 140 mg/dL      Peak postprandial:   less than 180 mg/dL (1-2 hours)      Critically ill patients:  140 - 180 mg/dL   Lab Results  Component Value Date   GLUCAP 175 (H) 03/13/2024   HGBA1C 10.3 (H) 03/10/2024    Provided patient with a ReliOn glucometer and supplies.  Educated him on use and when to check blood glucose.  He should bring his meter with him to his PCP appointments.  He can get more supplies such as strips and lancets at [employer]. He verbalizes understanding.     Thank you, Wyvonna Pinal, MSN, CDCES Diabetes Coordinator Inpatient Diabetes Program [phone] (team pager from 8a-5p)

## 2024-03-13 NOTE — TOC Progression Note (Addendum)
 Transition of Care [facility]) - Progression Note    Patient Details  Name: Justin [name] MRN: [id number] Date of Birth: 09/14/1980  [facility] of [facility]) CM/SW Contact  Arlana JINNY Nicholaus [place] Phone Number: [phone] 03/13/2024, 12:09 PM  Clinical Narrative:   HF CSW/NCM will continue to follow and monitor for safety dc planning.  Patient is uninsured. Will need assistance with medications.   Hospital follow up appointment scheduled for Thursday, March 27, 2024 at 3:30 PM.  PLEASE ARRIVE 10-15 minutes early.  PLEASE call to cancel/reschedule if you CANNOT make appointment.   TOC will continue following.     Expected Discharge Plan: Home/Self Care Barriers to Discharge: Continued Medical Work up  Expected Discharge Plan and Services   Discharge Planning Services: CM Consult   Living arrangements for the past 2 months: Single Family Home                                       Social Determinants of Health (SDOH) Interventions SDOH Screenings   Food Insecurity: Food Insecurity Present (03/10/2024)  Housing: High Risk (03/10/2024)  Transportation Needs: No Transportation Needs (03/10/2024)  Utilities: Not At Risk (03/10/2024)  Alcohol Screen: Medium Risk (02/20/2022)  Depression (PHQ2-9): Low Risk  (10/12/2022)  Financial Resource Strain: Medium Risk (06/05/2022)  Tobacco Use: Low Risk  (03/09/2024)  Health Literacy: Inadequate Health Literacy (09/21/2023)    Readmission Risk Interventions     No data to display

## 2024-03-13 NOTE — Progress Notes (Signed)
 Pharmacy Medication Assistance Program Note    03/13/2024  Patient ID: Justin [name], male   DOB: Oct 31, 1980, 43 y.o.   MRN: [id number]     03/13/2024  Outreach Medication One  Manufacturer Medication [employer] [facility] Xarelto   Dose of Xarelto  20MG   Type of Assistance Manufacturer Assistance  Patient Assistance Determination Approved  Approval Start Date 03/13/2024  Approval End Date 03/13/2025     NEW - APPROVED

## 2024-03-13 NOTE — Progress Notes (Signed)
 Advanced Heart Failure Rounding Note  Cardiologist: Shelda Bruckner, MD  HF MD: Dr. Rolan Chief Complaint: A/C HFrEF Subjective:    Coox 65. CO/CI (fick) 4.4/1.9. CVP 12 on Milrinone 0.25 mcg/kg/min 8.9L UOP (net - 8L) with Lasix  + diamox . Down 10lbs. Telemetry looks to be atrial tach, although hard to differentiate, no HRV at 120 bpm  Lying in bed tired this morning.   Objective:    Weight Range: 105 kg Body mass index is 35.2 kg/m.   Vital Signs:   Temp:  [97.8 F (36.6 C)-98.2 F (36.8 C)] 98.2 F (36.8 C) (07/03 0730) Pulse Rate:  [74-121] 120 (07/03 0730) Resp:  [18-20] 20 (07/03 0730) BP: (110-146)/(81-107) 122/99 (07/03 0730) SpO2:  [93 %-97 %] 94 % (07/03 0730) Weight:  [105 kg] 105 kg (07/03 0549) Last BM Date : 03/12/24  Weight change: Filed Weights   03/11/24 0420 03/12/24 0409 03/13/24 0549  Weight: 111.2 kg 109.9 kg 105 kg   Intake/Output:  Intake/Output Summary (Last 24 hours) at 03/13/2024 0807 Last data filed at 03/13/2024 0659 Gross per 24 hour  Intake 699.91 ml  Output 8926 ml  Net -8226.09 ml    Physical Exam    CVP 12 General: Obese appearing. No distress on RA Cardiac: JVP ~10cm. S1 and S2 present. No murmurs or rub. Extremities: Warm and dry.  1+ BLE edema.  Neuro: Alert and oriented x3. Affect pleasant. Moves all extremities without difficulty. Lines/Devices:  RUE PICC  Telemetry   ?AT 120 bpm, 0 HRV (personally reviewed)  EKG    Ordered  Labs    CBC Recent Labs    03/12/24 0423 03/13/24 0555  WBC 6.3 7.5  HGB 14.2 16.3  HCT 46.5 51.5  MCV 88.7 87.9  PLT 225 218   Basic Metabolic Panel Recent Labs    92/97/74 0423 03/13/24 0555  NA 138 131*  K 4.0 3.3*  CL 97* 90*  CO2 31 28  GLUCOSE 134* 279*  BUN 54* 55*  CREATININE 2.47* 2.58*  CALCIUM  8.8* 9.1  MG 1.9 2.1   BNP (last 3 results) Recent Labs    08/22/23 0722 03/09/24 1845  BNP 951.5* 2,578.5*   Medications:    Scheduled Medications:   amiodarone  200 mg Oral BID   atorvastatin  20 mg Oral Daily   Chlorhexidine  Gluconate Cloth  6 each Topical Daily   digoxin   0.0625 mg Oral Daily   hydrALAZINE   25 mg Oral TID with meals   insulin  aspart  0-15 Units Subcutaneous TID WC   melatonin  3 mg Oral QHS   potassium chloride   40 mEq Oral Once   rivaroxaban   20 mg Oral Q supper   sodium chloride  flush  10-40 mL Intracatheter Q12H   Infusions:  milrinone 0.25 mcg/kg/min (03/13/24 0659)   PRN Medications: hydrOXYzine, mouth rinse, saline, sodium chloride  flush   Patient Profile   Justin [name] is a 43 y.o. Spanish-speaking male with history of chronic systolic CHF, hx uncontrolled HTN, DM II, mitral regurgitation, ETOH abuse, hx crack/cocaine use. AHF team to see for A/C combined systolic and diastolic heart failure   Assessment/Plan   1. Acute on chronic systolic CHF: Long history of nonischemic cardiomyopathy. Cath in 6/23 with no significant CAD, CI 1.99.  Substance abuse (cocaine and ETOH) thought to play a role in cardiomyopathy, also uncontrolled HTN in the past.  However, cardiac MRI was done in 6/23 showing LVEF 29%, LV severely dilated, asymmetric LVH with  basal septum measuring 17 mm, RVEF 45%, patchy LGE in septum and RV insertion sites + subepicardial LGE in inferolateral wall. Scar pattern and asymmetric hypertrophy could be consistent with hypertrophic cardiomyopathy. However, due to subepicardial LGE in inferolateral wall and presence of mediastinal lymphadenopathy, cardiac PET recommended to rule out cardiac sarcoidosis.  This was never done due to lack of insurance.  Echo this admission showed EF 20-25% with moderate concentric LVH, severe RV dysfunction, probably severe functional MR with PISA ERO 0.47 cm^2, IVC dilated.  Patient was admitted with marked volume overload, NYHA class III symptoms and cardiorenal syndrome with creatinine 3.29 (uncertain of current baseline). Creatinine improving with  diuresis. Stable at 2.58 - coox initially 43.9. Started on Milrinone 0.25 mcg/kg/min, now 65%. CO/CI (fick) 4.4/1.9 - CVP 12; continue Lasix  120 mg bid + diamox  500 mg x1 this am - GDMT will be limited by AKI - increase hydral to 50 mg tid, plan to add imdur  this afternoon if BP still elevated. Likely will not be able to get Bidil w/o insurance. - start jardiance  10 mg daily, prev approved for patient assistance - stop digoxin  with Cr.  - not a good candidate for advanced therapies with noncompliance, lack of insurance, RV dysfunction and AKI.   2. Atrial tachycardia/fibrillation: Paroxysmal. He was started on warfarin at recent admission in [place]  but has not been having his INR checked so INR was > 10 at admission. Now down to 2.8. Check INR today.  - tele with what looks like 2:1 AT, HR 120 with 0 HRV. Check EKG. - continue Xarelto . PA sent.  - continue amio to 200 mg bid with milrinone  3. ?AKI on CKD stage 3: Suspect cardiorenal syndrome.  Last creatinine here in 12/24 was 1.6.   - Cr 3.29 on admit - down to 2.47 with diuresis, now 2.58  4. Mitral regurgitation: Severe functional MR on echo this admission.  - Continue aggressive diuresis, will recheck after diuresis and titration of GDMT.   5. ETOH/cocaine abuse: UDS negative this admission. Says he has quit.   6. Uncontrolled DM2: HgbA1c 10.3, per primary team.   7. Noncompliance: Has not followed up in HF clinic and generally not taking medications.  Does not have insurance.  - TOC consult placed; need SW assistance  Length of Stay: 3  [place] Emanii Bugbee, NP  03/13/2024, 8:07 AM  Advanced Heart Failure Team Pager [phone] (M-F; 7a - 5p)  Please contact [facility] for night-coverage after hours (5p -7a ) and weekends on [email or web address]

## 2024-03-13 NOTE — Plan of Care (Signed)
   Problem: Education: Goal: Ability to describe self-care measures that may prevent or decrease complications (Diabetes Survival Skills Education) will improve Outcome: Progressing

## 2024-03-14 DIAGNOSIS — R570 Cardiogenic shock: Secondary | ICD-10-CM

## 2024-03-14 DIAGNOSIS — N179 Acute kidney failure, unspecified: Secondary | ICD-10-CM

## 2024-03-14 LAB — BASIC METABOLIC PANEL WITH GFR
Anion gap: 12 (ref 5–15)
Anion gap: 12 (ref 5–15)
BUN: 64 mg/dL — ABNORMAL HIGH (ref 6–20)
BUN: 65 mg/dL — ABNORMAL HIGH (ref 6–20)
CO2: 30 mmol/L (ref 22–32)
CO2: 29 mmol/L (ref 22–32)
Calcium: 9.3 mg/dL (ref 8.9–10.3)
Calcium: 9.1 mg/dL (ref 8.9–10.3)
Chloride: 95 mmol/L — ABNORMAL LOW (ref 98–111)
Chloride: 96 mmol/L — ABNORMAL LOW (ref 98–111)
Creatinine, Ser: 3.29 mg/dL — ABNORMAL HIGH (ref 0.61–1.24)
Creatinine, Ser: 3.28 mg/dL — ABNORMAL HIGH (ref 0.61–1.24)
GFR, Estimated: 23 mL/min — ABNORMAL LOW (ref >60)
GFR, Estimated: 23 mL/min — ABNORMAL LOW (ref >60)
Glucose, Bld: 134 mg/dL — ABNORMAL HIGH (ref 70–99)
Glucose, Bld: 154 mg/dL — ABNORMAL HIGH (ref 70–99)
Potassium: 3.7 mmol/L (ref 3.5–5.1)
Potassium: 4.4 mmol/L (ref 3.5–5.1)
Sodium: 137 mmol/L (ref 135–145)
Sodium: 137 mmol/L (ref 135–145)

## 2024-03-14 LAB — CBC
HCT: 51 % (ref 39.0–52.0)
Hemoglobin: 16 g/dL (ref 13.0–17.0)
MCH: 27.4 pg (ref 26.0–34.0)
MCHC: 31.4 g/dL (ref 30.0–36.0)
MCV: 87.2 fL (ref 80.0–100.0)
Platelets: 214 K/uL (ref 150–400)
RBC: 5.85 MIL/uL — ABNORMAL HIGH (ref 4.22–5.81)
RDW: 14.9 % (ref 11.5–15.5)
WBC: 7.1 K/uL (ref 4.0–10.5)
nRBC: 0 % (ref 0.0–0.2)

## 2024-03-14 LAB — GLUCOSE, CAPILLARY
Glucose-Capillary: 137 mg/dL — ABNORMAL HIGH (ref 70–99)
Glucose-Capillary: 158 mg/dL — ABNORMAL HIGH (ref 70–99)
Glucose-Capillary: 150 mg/dL — ABNORMAL HIGH (ref 70–99)
Glucose-Capillary: 177 mg/dL — ABNORMAL HIGH (ref 70–99)

## 2024-03-14 LAB — MAGNESIUM: Magnesium: 2 mg/dL (ref 1.7–2.4)

## 2024-03-14 LAB — COOXEMETRY PANEL
Carboxyhemoglobin: 1.3 % (ref 0.5–1.5)
Methemoglobin: <0.7 % (ref 0.0–1.5)
O2 Saturation: 45.2 %
Total hemoglobin: 16.6 g/dL — ABNORMAL HIGH (ref 12.0–16.0)

## 2024-03-14 LAB — PROTIME-INR
INR: 3.1 — ABNORMAL HIGH (ref 0.8–1.2)
Prothrombin Time: 33.3 s — ABNORMAL HIGH (ref 11.4–15.2)

## 2024-03-14 MED ORDER — RIVAROXABAN 15 MG PO TABS
15.0000 mg | ORAL_TABLET | Freq: Every day | ORAL | Status: DC
  Administered 2024-03-14 – 2024-03-18 (×5): 15 mg via ORAL
  Filled 2024-03-14 (×5): qty 1

## 2024-03-14 MED ORDER — EMPAGLIFLOZIN 10 MG PO TABS
10.0000 mg | ORAL_TABLET | Freq: Every day | ORAL | Status: DC
  Administered 2024-03-14 – 2024-03-19 (×6): 10 mg via ORAL
  Filled 2024-03-14 (×6): qty 1

## 2024-03-14 MED ORDER — POTASSIUM CHLORIDE CRYS ER 20 MEQ PO TBCR
40.0000 meq | EXTENDED_RELEASE_TABLET | Freq: Once | ORAL | Status: AC
  Administered 2024-03-14: 40 meq via ORAL
  Filled 2024-03-14: qty 2

## 2024-03-14 NOTE — Progress Notes (Signed)
 Cardiology update: coox dropping since being in flutter. Will increase milrinone  to 0.25 mcg/kg/min, ideally can wean again once in SR.

## 2024-03-14 NOTE — Plan of Care (Signed)
  Problem: Education: Goal: Knowledge of General Education information will improve Description: Including pain rating scale, medication(s)/side effects and non-pharmacologic comfort measures Outcome: Progressing   Problem: Clinical Measurements: Goal: Will remain free from infection Outcome: Progressing   Problem: Clinical Measurements: Goal: Diagnostic test results will improve Outcome: Progressing   Problem: Clinical Measurements: Goal: Cardiovascular complication will be avoided Outcome: Progressing

## 2024-03-14 NOTE — Plan of Care (Signed)
  Problem: Tissue Perfusion: Goal: Adequacy of tissue perfusion will improve Outcome: Progressing   Problem: Education: Goal: Knowledge of General Education information will improve Description: Including pain rating scale, medication(s)/side effects and non-pharmacologic comfort measures Outcome: Progressing   Problem: Health Behavior/Discharge Planning: Goal: Ability to manage health-related needs will improve Outcome: Progressing

## 2024-03-14 NOTE — Progress Notes (Signed)
 Rounding Note    Patient Name: Justin [name] Date of Encounter: 03/14/2024  [facility] Cardiologist: Shelda Bruckner, MD  Advanced heart failure: Dr. Rolan  Subjective   Seen with assistance of video interpreter [facility]. Feeling well, no acute events overnight. Amenable to remaining in the hospital until we can get him out of flutter. Breathing is improved, no LE edema, no pain.  Inpatient Medications    Scheduled Meds:  acetaminophen   1,000 mg Oral TID   atorvastatin   20 mg Oral Daily   Chlorhexidine  Gluconate Cloth  6 each Topical Daily   hydrALAZINE   50 mg Oral TID with meals   insulin  aspart  0-15 Units Subcutaneous TID WC   isosorbide  mononitrate  30 mg Oral Daily   melatonin  3 mg Oral QHS   potassium chloride   40 mEq Oral Once   rivaroxaban   15 mg Oral Q supper   sodium chloride  flush  10-40 mL Intracatheter Q12H   Continuous Infusions:  amiodarone  30 mg/hr (03/14/24 0453)   milrinone  0.125 mcg/kg/min (03/13/24 2036)   PRN Meds: HYDROmorphone , hydrOXYzine , mouth rinse, saline, sodium chloride  flush   Vital Signs    Vitals:   03/13/24 1919 03/14/24 0016 03/14/24 0420 03/14/24 0829  BP: 93/65 111/79 117/80 111/82  Pulse: (!) 106 (!) 110 (!) 110 (!) 111  Resp: 18 18 18 18   Temp: 98 F (36.7 C) 97.9 F (36.6 C) 97.6 F (36.4 C) 97.9 F (36.6 C)  TempSrc: Oral Oral Axillary Oral  SpO2: 95% 91% 96% 96%  Weight:   103.4 kg   Height:        Intake/Output Summary (Last 24 hours) at 03/14/2024 0954 Last data filed at 03/14/2024 9390 Gross per 24 hour  Intake 480 ml  Output 2625 ml  Net -2145 ml      03/14/2024    4:20 AM 03/13/2024    5:49 AM 03/12/2024    4:09 AM  Last 3 Weights  Weight (lbs) 227 lb 15.3 oz 231 lb 7.7 oz 242 lb 4.6 oz  Weight (kg) 103.4 kg 105 kg 109.9 kg      Telemetry    Atrial flutter - Personally Reviewed  Physical Exam   GEN: No acute distress.   Neck: No JVD at 45 degrees Cardiac:  tachycardic, regular rhythm, no murmurs, rubs, or gallops.  Respiratory: Clear to auscultation bilaterally. GI: Soft, nontender, non-distended  MS: No edema; No deformity. Neuro:  Nonfocal  Psych: Normal affect   New pertinent results (labs, ECG, imaging, cardiac studies)    ECG 7/3 personally reviewed, atypical atrial flutter with 2:1 conduction at 120 bpm  Assessment & Plan    Acute on chronic systolic heart failure Acute RV failure Nonischemic cardiomyopathy Cardiogenic shock Acute kidney injury, likely cardiorenal syndrome, with chronic kidney disease stage 3a previously -EF 20-25%, probably severe functional MR, severe RV dysfunction -admission weight 113.3 kg, current weight 103.4 kg, charted net negative 17 L though minimal input charted -presenting Cr 3.29 (was 1.61 08/2023), nadir 2/47, now trending back up and at 3.29 today -CVP 4, diuretics on hold -on milrinone , initial coox 43.9. Coox pending today. Currently on milrinone  0.125 mcg/kg/min -GDMT limited by renal function. BP 111/82. Continue hydralazine  50 mg TID, imdur  30 mg daily. No beta blocker given inotropes/shock. No ACEi/ARB/ARNI/MRA given renal function. Tolerating Jardiance . -not a candidate for home inotropes or advanced therapies per advanced heart failure team  Atypical atrial flutter History of atrial fibrillation -CHA2DS2/VAS Stroke Risk Points=3  -  was on warfarin prior to admission with INR >10 -INR 3.1 today. Continue renally dosed Xarelto  -on IV amiodarone . If he does not convert over the weekend, need to plan for TEE-CV on Monday -K 3.7, Mg 2.0  Type II diabetes -on insulin , Jardiance     Signed, Shelda Bruckner, MD  03/14/2024, 9:54 AM

## 2024-03-14 NOTE — Progress Notes (Signed)
 Pt ambulated in hallway ~ 400 ft O2 sat while ambulating was 91-92% on RA. Pt HR rose to 200 while ambulating.

## 2024-03-14 NOTE — Progress Notes (Addendum)
 HD#4 SUBJECTIVE:  Patient Summary: Justin [name] is a 43 y.o. with a pertinent PMH of biventricular HFrEF (last EF 20-25% this admission), paroxysmal a-fib on anticoagulation, T2DM, HTN who presented with dyspnea, BLE edema and orthopnea and admitted for heart failure exacerbation. His lower extremity edema and dyspnea have improved significantly.  Overnight Events: None  Interim History: Justin [name] is feeling better this morning, not requiring any oxygen. He notes that his legs are not swollen any more.   History obtained through use of interpreter video service  Heart failure following, recommend increasing milrinone  to .25mcg/kg/min, increasing hydralazine  to 50mg  TID, adding Imdur , and holding diuretics. New aflutter visualized and will need TEE/DCCV if unresolved over the weekend  Discharge meds of torsemide , amiodarone  hydralazine , imdur , and xarelto  per cards when ready.  Approved for 1 year of Xarelto  with manufacturing assistance.  OBJECTIVE:  Vital Signs: Vitals:   03/14/24 0016 03/14/24 0420 03/14/24 0829 03/14/24 1120  BP: 111/79 117/80 111/82 115/73  Pulse: (!) 110 (!) 110 (!) 111 (!) 110  Resp: 18 18 18 18   Temp: 97.9 F (36.6 C) 97.6 F (36.4 C) 97.9 F (36.6 C) 97.7 F (36.5 C)  TempSrc: Oral Axillary Oral Oral  SpO2: 91% 96% 96% 91%  Weight:  103.4 kg    Height:       Supplemental O2: Nasal Cannula SpO2: 91 % O2 Flow Rate (L/min): 1.5 L/min  Filed Weights   03/12/24 0409 03/13/24 0549 03/14/24 0420  Weight: 109.9 kg 105 kg 103.4 kg     Intake/Output Summary (Last 24 hours) at 03/14/2024 1407 Last data filed at 03/14/2024 1255 Gross per 24 hour  Intake 720 ml  Output 2350 ml  Net -1630 ml   Net IO Since Admission: -17,192.34 mL [03/14/24 1407]  Physical Exam: Physical Exam Constitutional:      General: He is not in acute distress. Cardiovascular:     Rate and Rhythm: Tachycardia present.  Pulmonary:      Comments: Fine crackles bilaterally at both bases  Neurological:     Mental Status: He is alert.      Patient Lines/Drains/Airways Status     Active Line/Drains/Airways     Name Placement date Placement time Site Days   PICC Double Lumen 03/11/24 Right Basilic 41 cm 0 cm 03/11/24  8356  -- 3            Pertinent labs and imaging:      Latest Ref Rng & Units 03/14/2024    5:50 AM 03/13/2024    5:55 AM 03/12/2024    4:23 AM  CBC  WBC 4.0 - 10.5 K/uL 7.1  7.5  6.3   Hemoglobin 13.0 - 17.0 g/dL 83.9  83.6  85.7   Hematocrit 39.0 - 52.0 % 51.0  51.5  46.5   Platelets 150 - 400 K/uL 214  218  225        Latest Ref Rng & Units 03/14/2024    5:50 AM 03/13/2024    2:00 PM 03/13/2024    5:55 AM  CMP  Glucose 70 - 99 mg/dL 865  832  720   BUN 6 - 20 mg/dL 64  57  55   Creatinine 0.61 - 1.24 mg/dL 6.70  7.15  7.41   Sodium 135 - 145 mmol/L 137  137  131   Potassium 3.5 - 5.1 mmol/L 3.7  3.7  3.3   Chloride 98 - 111 mmol/L 95  91  90  CO2 22 - 32 mmol/L 30  29  28    Calcium  8.9 - 10.3 mg/dL 9.3  9.4  9.1     No results found.   Latest Reference Range & Units 03/14/24 05:50  Prothrombin Time 11.4 - 15.2 seconds 33.3 (H)  INR 0.8 - 1.2  3.1 (H)  (H): Data is abnormally high   Latest Reference Range & Units 03/13/24 14:50  Total hemoglobin 12.0 - 16.0 g/dL 82.8 (H)  Carboxyhemoglobin 0.5 - 1.5 % 2.2 (H)  Methemoglobin 0.0 - 1.5 % <0.7  O2 Saturation % 55  (H): Data is abnormally high  ASSESSMENT/PLAN:  Assessment: Principal Problem:   Acute hypoxic respiratory failure (HCC) Active Problems:   Essential hypertension   Acute on chronic systolic (congestive) heart failure (HCC)   Type 2 diabetes mellitus with hyperlipidemia (HCC)   AKI (acute kidney injury) (HCC)   Paroxysmal atrial fibrillation with RVR (HCC)   Plan: #Acute hypoxic respiratory failure due to combined systolic and diastolic heart failure exacerbation, low output state  Presented with shortness of  breath and bilateral lower extremity swelling. Lactic acid normal. New echo on 7/1 showed worsening right ventricular systolic function and worsening mitral insufficiency. Heart failure team is onboard. He had a PICC placed and an venous O2 saturation of 43.9 which improved to 65.9 after administration of Milrinone , then worsened after Milrinone  was weaned. This is likely reflective of a low-output state. He has multiple social barriers including poor social support and lack of insurance. Weight trending down. He is euvolemic on exam today   Plan: -daily weights -holding BB for now -atorvastatin  20mg  -Hold Lasix  and Acetazolamide  given increase in his creatinine  -Milrinone  0.25 after worsening co-ox -Afterload reduction with hydralizine 50mg  TID per cardiology -wean O2 as tolerated -heart failure team on board, appreciate recs. -Imdur  30mg  daily -Jardiance  10mg    #Hypokalemia  K was 3.7 this morning yesterday and Creatinine increased to 3.29 - K given with goal of K>4 -PM BMP   #Paroxysmal A-fib -recently started warfarin instead of Xarelto , had previously been taking Xarelto  due to daily dosing and no repeated checks. INR greater than 10 on admission, INR after vitamin K  2.8. Given Vitamin K  for anticoagulation reversal x2.  INR up to 4.3 on 7/3. 3.1 on 7/4   Plan -Renally dosed Xarelto  to 15mg  today given worsening CrClearance -amiodarone  200 mg bid   #Anxiety #Insomnia #Tachycardia #2:1 Atrial flutter New Aflutter seen on tele on 7/3.   Plan -cardioversion with cardiology if not resolved -Melatonin 3mg  at bed time -PRN Hydroxyzine  25mg  TID for anxiety    #AKI on CKD stage IIIa -Creatinine increased to 3.29 on admission from baseline of 1.6 in December 2024 -renal U/S showed echogenic kidneys compatible with chronic medical renal disease, new or progressed since 2023. No hydronephrosis or acute renal finding. -likely cardiorenal AKI followed by  overdiuresis -Creatinine 3.29 this morning, increased from 2.84   Plan -strict ins and outs -holding diuresis -pending cardiology recs -PM BMP     #T2DM -Started on glimeperide 2mg  prior to admission -A1c 10.3 this admission -received 6 units of correctional insulin  yesterday -may need to adjust medication prior to discharge   Plan: -Moderate SSI -Jardiance  10mg    #HTN -BP well controlled today -holding BB for now with acute exacerbation, waiting for additional GDMT given kidney function, continue diuresis -Hydralizine and Imdur   Best Practice: Diet: Cardiac diet VTE: Xarelto  15mg  Code: Full  Disposition planning: Therapy Recs: SNF,  DISPO: Anticipated discharge in  2-3 days to Skilled nursing facility pending further medical care.   Signature:  Melvenia Napoleon [facility]  2:07 PM, 03/14/2024  On Call pager [phone]

## 2024-03-15 DIAGNOSIS — I5020 Unspecified systolic (congestive) heart failure: Secondary | ICD-10-CM

## 2024-03-15 LAB — GLUCOSE, CAPILLARY
Glucose-Capillary: 132 mg/dL — ABNORMAL HIGH (ref 70–99)
Glucose-Capillary: 165 mg/dL — ABNORMAL HIGH (ref 70–99)
Glucose-Capillary: 181 mg/dL — ABNORMAL HIGH (ref 70–99)
Glucose-Capillary: 165 mg/dL — ABNORMAL HIGH (ref 70–99)

## 2024-03-15 LAB — CBC
HCT: 49.8 % (ref 39.0–52.0)
Hemoglobin: 16.1 g/dL (ref 13.0–17.0)
MCH: 28 pg (ref 26.0–34.0)
MCHC: 32.3 g/dL (ref 30.0–36.0)
MCV: 86.8 fL (ref 80.0–100.0)
Platelets: 184 K/uL (ref 150–400)
RBC: 5.74 MIL/uL (ref 4.22–5.81)
RDW: 14.9 % (ref 11.5–15.5)
WBC: 6.3 K/uL (ref 4.0–10.5)
nRBC: 0 % (ref 0.0–0.2)

## 2024-03-15 LAB — COOXEMETRY PANEL
Carboxyhemoglobin: 1.6 % — ABNORMAL HIGH (ref 0.5–1.5)
Methemoglobin: <0.7 % (ref 0.0–1.5)
O2 Saturation: 52 %
Total hemoglobin: 16.6 g/dL — ABNORMAL HIGH (ref 12.0–16.0)

## 2024-03-15 LAB — BASIC METABOLIC PANEL WITH GFR
Anion gap: 11 (ref 5–15)
BUN: 60 mg/dL — ABNORMAL HIGH (ref 6–20)
CO2: 26 mmol/L (ref 22–32)
Calcium: 8.8 mg/dL — ABNORMAL LOW (ref 8.9–10.3)
Chloride: 101 mmol/L (ref 98–111)
Creatinine, Ser: 2.97 mg/dL — ABNORMAL HIGH (ref 0.61–1.24)
GFR, Estimated: 26 mL/min — ABNORMAL LOW (ref >60)
Glucose, Bld: 124 mg/dL — ABNORMAL HIGH (ref 70–99)
Potassium: 3.4 mmol/L — ABNORMAL LOW (ref 3.5–5.1)
Sodium: 138 mmol/L (ref 135–145)

## 2024-03-15 LAB — MAGNESIUM: Magnesium: 2.1 mg/dL (ref 1.7–2.4)

## 2024-03-15 MED ORDER — POTASSIUM CHLORIDE CRYS ER 20 MEQ PO TBCR
40.0000 meq | EXTENDED_RELEASE_TABLET | Freq: Once | ORAL | Status: AC
  Administered 2024-03-15: 40 meq via ORAL
  Filled 2024-03-15: qty 2

## 2024-03-15 NOTE — Progress Notes (Addendum)
 HD#5 Subjective:  Summary: Justin [name] is a 43 y.o. male with PMH of biventricular HFrEF (20 to 25%), paroxysmal A-fib on anticoagulation, T2DM, HTN who presents with dyspnea and lower extremity edema and admitted for acute HFrEF exacerbation.  Overnight Events: None  Interval History:  Patient evaluated at bedside with virtual interpreter present.  Denies any shortness of breath and been off supplemental oxygen.  Reports leg swelling has improved.  Reports feeling well.  Still voiding and had bowel movement this morning.  Objective:  Vital signs in last 24 hours: Vitals:   03/14/24 1526 03/14/24 1924 03/14/24 2353 03/15/24 0418  BP: 98/79 113/84 110/86 117/83  Pulse: (!) 111 (!) 110 (!) 57 (!) 113  Resp: 18 19 18 19   Temp: 97.8 F (36.6 C) 98 F (36.7 C) 98.1 F (36.7 C) 97.9 F (36.6 C)  TempSrc: Oral Oral Oral Oral  SpO2: 93% 94% 96% 97%  Weight:    103.6 kg  Height:       Supplemental O2: Room Air SpO2: 97 % O2 Flow Rate (L/min): 1.5 L/min   Physical Exam:  Constitutional: Alert, sitting up in bed comfortably, in no acute distress Cardiovascular: Tachycardia, regular rhythm Pulmonary/Chest: Normal work of breathing on room air, lungs clear bilaterally Abdominal: Bowel sounds present, soft, nontender MSK: No LE edema Neurological: alert & oriented x 3  Filed Weights   03/13/24 0549 03/14/24 0420 03/15/24 0418  Weight: 105 kg 103.4 kg 103.6 kg     Intake/Output Summary (Last 24 hours) at 03/15/2024 0544 Last data filed at 03/15/2024 0540 Gross per 24 hour  Intake 1020.2 ml  Output 1590 ml  Net -569.8 ml   Net IO Since Admission: -17,352.14 mL [03/15/24 0544]  Pertinent Labs:    Latest Ref Rng & Units 03/14/2024    5:50 AM 03/13/2024    5:55 AM 03/12/2024    4:23 AM  CBC  WBC 4.0 - 10.5 K/uL 7.1  7.5  6.3   Hemoglobin 13.0 - 17.0 g/dL 83.9  83.6  85.7   Hematocrit 39.0 - 52.0 % 51.0  51.5  46.5   Platelets 150 - 400 K/uL 214  218  225         Latest Ref Rng & Units 03/14/2024    2:07 PM 03/14/2024    5:50 AM 03/13/2024    2:00 PM  CMP  Glucose 70 - 99 mg/dL 845  865  832   BUN 6 - 20 mg/dL 65  64  57   Creatinine 0.61 - 1.24 mg/dL 6.71  6.70  7.15   Sodium 135 - 145 mmol/L 137  137  137   Potassium 3.5 - 5.1 mmol/L 4.4  3.7  3.7   Chloride 98 - 111 mmol/L 96  95  91   CO2 22 - 32 mmol/L 29  30  29    Calcium  8.9 - 10.3 mg/dL 9.1  9.3  9.4     Imaging: No results found.  Assessment/Plan:   Principal Problem:   Acute hypoxic respiratory failure (HCC) Active Problems:   Essential hypertension   Acute on chronic systolic (congestive) heart failure (HCC)   Type 2 diabetes mellitus with hyperlipidemia (HCC)   AKI (acute kidney injury) (HCC)   Paroxysmal atrial fibrillation with RVR (HCC)   Patient Summary: Justin [name] is a 43 y.o. with a pertinent PMH of biventricular HFrEF (20 to 25%), paroxysmal A-fib on anticoagulation, T2DM, HTN who presents with dyspnea and lower  extremity edema and admitted for acute HFrEF exacerbation.   #Acute hypoxic respiratory failure 2/2 biventricular heart failure, low output state #HFrEF (20-25% EF) Urinary output has been stable, yesterday had 1.3 L.  Weight has improved to 103.6 kg from admission of 113 kg.  Renal function worsened yesterday but is trending down today.  Stable on room air.  Remains on milrinone , co-ox better than yesterday with cardiology following. K 3.4 and Mag 2.1. - Appreciate cardiology assistance - Holding diuresis given euvolemia  -Remains on milrinone , monitoring co-ox, cardiology to titrate/wean - GDMT: Jardiance  10 mg daily, hydralazine  50 mg 3 times daily, Imdur  30 mg daily - No ACE/ARB/ARNI, MRA in setting of renal status at this time - No beta-blocker at this time given low output state - Strict I's and O's, daily weights - Trend BMP, replete K and mag as needed  #Atrial flutter #Paroxysmal A-fib on anticoagulation CHA2DS2-VASc  of 3.  Telemetry yesterday showed sinus tachycardia.  Was noted to be in atrial flutter.  Remains on IV amiodarone . Tentative plans for TEE-CV on Monday if remains in a-flutter.  - Continue Xarelto  renally dosed - Continue IV amiodarone   #AKI on CKD 3A Suspect cardiorenal. Renal function today was 2.97.  Baseline of 1.6-2.0 in late 2024, question if CKD progression.  Continues to have adequate urine output.  No longer being diuresed given euvolemia.  Renal ultrasound with chronic medical renal disease with slight new/progression from 2023.  - Trend BMP - Strict I's and O's - If continues to worsen or decreased UOP consider nephrology consult  #T2DM, uncontrolled with hyperglycemia A1c at this admission 10.3.  Previously on glimepiride  2 mg. - Continue Jardiance  10 mg daily - SSI-M  #HTN Normotensive.  - Continue hydralazine  and Imdur  as listed above  #Anxiety #Insomnia - Melatonin 3 mg nightly - Hydroxyzine  25 mg 3 times daily as needed for anxiety   Diet: Heart Healthy IVF: None Abx: None VTE: DOAC Code: Full PT/OT recs: None Family Update: none at bedside    Dispo: Anticipated discharge to Home in 2-4 days pending weaning milrinone .   Justin Groene, DO Internal Medicine Resident PGY-3 Please contact the on call pager at [phone].

## 2024-03-15 NOTE — Progress Notes (Addendum)
 Progress Note  Patient Name: Justin [name] Date of Encounter: 03/15/2024  Primary Cardiologist: Shelda Bruckner, MD  Interval Summary  Chart reviewed.  Patient interviewed with use of Spanish video interpreter.  He does not report any shortness of breath or chest pain at rest.  We discussed overall plan for TEE cardioversion on Monday if he does not convert from atrial flutter over the weekend.  Vital Signs  Vitals:   03/14/24 1924 03/14/24 2353 03/15/24 0418 03/15/24 0745  BP: 113/84 110/86 117/83 (!) 124/92  Pulse: (!) 110 (!) 57 (!) 113 (!) 109  Resp: 19 18 19 18   Temp: 98 F (36.7 C) 98.1 F (36.7 C) 97.9 F (36.6 C) 97.9 F (36.6 C)  TempSrc: Oral Oral Oral Oral  SpO2: 94% 96% 97% 94%  Weight:   103.6 kg   Height:        Intake/Output Summary (Last 24 hours) at 03/15/2024 0917 Last data filed at 03/15/2024 0825 Gross per 24 hour  Intake 1140.2 ml  Output 1315 ml  Net -174.8 ml   Filed Weights   03/13/24 0549 03/14/24 0420 03/15/24 0418  Weight: 105 kg 103.4 kg 103.6 kg    Physical Exam  GEN: No acute distress.   Neck: No JVD. Cardiac: Rapid regular rhythm, no gallop.  Respiratory: Nonlabored. Clear to auscultation bilaterally. GI: Soft, nontender, bowel sounds present. MS: No edema. Neuro:  Nonfocal. Psych: Alert and oriented x 3. Normal affect.  ECG/Telemetry  Telemetry reviewed showing atypical atrial flutter with 2:1 block.  Labs  Chemistry Recent Labs  Lab 03/09/24 2059 03/10/24 0222 03/14/24 0550 03/14/24 1407 03/15/24 0525  NA  --    < > 137 137 138  K  --    < > 3.7 4.4 3.4*  CL  --    < > 95* 96* 101  CO2  --    < > 30 29 26   GLUCOSE  --    < > 134* 154* 124*  BUN  --    < > 64* 65* 60*  CREATININE  --    < > 3.29* 3.28* 2.97*  CALCIUM   --    < > 9.3 9.1 8.8*  PROT 6.3*  --   --   --   --   ALBUMIN 2.8*  --   --   --   --   AST 36  --   --   --   --   ALT 30  --   --   --   --   ALKPHOS 89  --   --   --    --   BILITOT 1.0  --   --   --   --   GFRNONAA  --    < > 23* 23* 26*  ANIONGAP  --    < > 12 12 11    < > = values in this interval not displayed.    Hematology Recent Labs  Lab 03/13/24 0555 03/14/24 0550 03/15/24 0525  WBC 7.5 7.1 6.3  RBC 5.86* 5.85* 5.74  HGB 16.3 16.0 16.1  HCT 51.5 51.0 49.8  MCV 87.9 87.2 86.8  MCH 27.8 27.4 28.0  MCHC 31.7 31.4 32.3  RDW 14.8 14.9 14.9  PLT 218 214 184   Cardiac Enzymes Recent Labs  Lab 03/09/24 1842 03/09/24 2059  TROPONINIHS 40* 44*   Lipid Panel     Component Value Date/Time   CHOL 73 03/10/2024 0222   CHOL 265 (  H) 08/23/2018 1004   TRIG 93 03/10/2024 0222   HDL 24 (L) 03/10/2024 0222   HDL 41 08/23/2018 1004   CHOLHDL 3.0 03/10/2024 0222   VLDL 19 03/10/2024 0222   LDLCALC 30 03/10/2024 0222   LDLCALC Comment 08/23/2018 1004   LABVLDL Comment 08/23/2018 1004    Cardiac Studies  Echocardiogram 03/11/2024:  1. No left ventricular thrombus is seen, but Definity  contrast was not  used. Left ventricular ejection fraction, by estimation, is 20 to 25%. The  left ventricle has severely decreased function. The left ventricle  demonstrates global hypokinesis. The  left ventricular internal cavity size was mildly dilated. There is  moderate concentric left ventricular hypertrophy. Indeterminate diastolic  filling due to E-A fusion.   2. Right ventricular systolic function is severely reduced. The right  ventricular size is severely enlarged. There is moderately elevated  pulmonary artery systolic pressure. The estimated right ventricular  systolic pressure is 59.1 mmHg.   3. Left atrial size was severely dilated.   4. Right atrial size was severely dilated.   5. The mitral valve is abnormal. Moderate to severe mitral valve  regurgitation.   6. Tricuspid valve regurgitation is mild to moderate.   7. The aortic valve is tricuspid. Aortic valve regurgitation is not  visualized. No aortic stenosis is present.   8. The  inferior vena cava is dilated in size with <50% respiratory  variability, suggesting right atrial pressure of 15 mmHg.   Assessment & Plan  1.  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  Hemodynamics stable with supportive measures, follow-up co-ox 52 today.  CVP 10.  Remains on IV milrinone  along with Jardiance , hydralazine , and Imdur .  No diuretics at this time with net diuresis approximately 300 cc last 24 hours.  2.  Atypical atrial flutter with 2:1 block.  CHA2DS2-VASc score is 3.  He remains on IV amiodarone  along with Xarelto .  3.  Acute renal failure with history of CKD stage IIIa.  Creatinine down to 2.97 and GFR 26.  Chart reviewed, plan per Dr. Lonni is TEE guided cardioversion on Monday if patient does not convert to sinus rhythm on IV amiodarone  over the weekend.  We discussed risk/benefits and he is in agreement to proceed if necessary.  Will get this tentatively scheduled.  He remains in atypical atrial flutter and is otherwise hemodynamically stable.  Continue milrinone  at 0.25 mcg/kg/min, Xarelto  15 mg daily, Imdur  30 mg daily, hydralazine  50 mg 3 times a day, and Jardiance  10 mg daily.  For questions or updates, please contact [facility] Please consult [email or web address] for contact info under   Signed, Jayson Sierras, MD  03/15/2024, 9:17 AM

## 2024-03-15 NOTE — Progress Notes (Signed)
 Talked briefly with patient with interpreter [phone] Justin [name]. Helped patient understand their need to be in the hospital, and the two drips they're on (amio, Milrinone ).   Explained p. heart rate with walk yesterday, Pt ambulated in hallway ~ 400 ft O2 sat while ambulating was 91-92% on RA. Pt HR rose to 200 while ambulating. And gave patient time to ask questions. Patient didn't experience discomfort with racing heart. Patient didn't ask lots of questions.  Velinda Server, RN 03/15/2024 6:23 PM

## 2024-03-15 NOTE — Plan of Care (Signed)
  Problem: Education: Goal: Knowledge of General Education information will improve Description: Including pain rating scale, medication(s)/side effects and non-pharmacologic comfort measures Outcome: Progressing   Problem: Clinical Measurements: Goal: Diagnostic test results will improve Outcome: Progressing   Problem: Clinical Measurements: Goal: Cardiovascular complication will be avoided Outcome: Progressing   

## 2024-03-16 LAB — CBC
HCT: 49.2 % (ref 39.0–52.0)
Hemoglobin: 16 g/dL (ref 13.0–17.0)
MCH: 28 pg (ref 26.0–34.0)
MCHC: 32.5 g/dL (ref 30.0–36.0)
MCV: 86.2 fL (ref 80.0–100.0)
Platelets: 195 K/uL (ref 150–400)
RBC: 5.71 MIL/uL (ref 4.22–5.81)
RDW: 15 % (ref 11.5–15.5)
WBC: 7.2 K/uL (ref 4.0–10.5)
nRBC: 0 % (ref 0.0–0.2)

## 2024-03-16 LAB — MAGNESIUM: Magnesium: 2.2 mg/dL (ref 1.7–2.4)

## 2024-03-16 LAB — BASIC METABOLIC PANEL WITH GFR
Anion gap: 10 (ref 5–15)
BUN: 53 mg/dL — ABNORMAL HIGH (ref 6–20)
CO2: 25 mmol/L (ref 22–32)
Calcium: 9 mg/dL (ref 8.9–10.3)
Chloride: 103 mmol/L (ref 98–111)
Creatinine, Ser: 2.75 mg/dL — ABNORMAL HIGH (ref 0.61–1.24)
GFR, Estimated: 28 mL/min — ABNORMAL LOW (ref >60)
Glucose, Bld: 124 mg/dL — ABNORMAL HIGH (ref 70–99)
Potassium: 3.7 mmol/L (ref 3.5–5.1)
Sodium: 138 mmol/L (ref 135–145)

## 2024-03-16 LAB — GLUCOSE, CAPILLARY
Glucose-Capillary: 146 mg/dL — ABNORMAL HIGH (ref 70–99)
Glucose-Capillary: 149 mg/dL — ABNORMAL HIGH (ref 70–99)
Glucose-Capillary: 176 mg/dL — ABNORMAL HIGH (ref 70–99)
Glucose-Capillary: 162 mg/dL — ABNORMAL HIGH (ref 70–99)

## 2024-03-16 LAB — COOXEMETRY PANEL
Carboxyhemoglobin: 1.7 % — ABNORMAL HIGH (ref 0.5–1.5)
Methemoglobin: <0.7 % (ref 0.0–1.5)
O2 Saturation: 63.7 %
Total hemoglobin: 16.3 g/dL — ABNORMAL HIGH (ref 12.0–16.0)

## 2024-03-16 MED ORDER — POTASSIUM CHLORIDE CRYS ER 20 MEQ PO TBCR
30.0000 meq | EXTENDED_RELEASE_TABLET | Freq: Once | ORAL | Status: AC
  Administered 2024-03-16: 30 meq via ORAL
  Filled 2024-03-16: qty 1

## 2024-03-16 NOTE — Anesthesia Preprocedure Evaluation (Signed)
 Anesthesia Evaluation  Patient identified by MRN, date of birth, ID band Patient awake    Reviewed: Allergy & Precautions, H&P , NPO status , Patient's Chart, lab work & pertinent test results  Airway Mallampati: II  TM Distance: >3 FB Neck ROM: Full    Dental no notable dental hx.    Pulmonary neg pulmonary ROS, neg sleep apnea, neg COPD   Pulmonary exam normal breath sounds clear to auscultation       Cardiovascular hypertension, +CHF  Normal cardiovascular exam+ dysrhythmias Atrial Fibrillation  Rhythm:Regular Rate:Normal  IMPRESSIONS     1. No left ventricular thrombus is seen, but Definity  contrast was not  used. Left ventricular ejection fraction, by estimation, is 20 to 25%. The  left ventricle has severely decreased function. The left ventricle  demonstrates global hypokinesis. The  left ventricular internal cavity size was mildly dilated. There is  moderate concentric left ventricular hypertrophy. Indeterminate diastolic  filling due to E-A fusion.   2. Right ventricular systolic function is severely reduced. The right  ventricular size is severely enlarged. There is moderately elevated  pulmonary artery systolic pressure. The estimated right ventricular  systolic pressure is 59.1 mmHg.   3. Left atrial size was severely dilated.   4. Right atrial size was severely dilated.   5. The mitral valve is abnormal. Moderate to severe mitral valve  regurgitation.   6. Tricuspid valve regurgitation is mild to moderate.   7. The aortic valve is tricuspid. Aortic valve regurgitation is not  visualized. No aortic stenosis is present.   8. The inferior vena cava is dilated in size with <50% respiratory  variability, suggesting right atrial pressure of 15 mmHg.     Neuro/Psych neg Seizures negative neurological ROS  negative psych ROS   GI/Hepatic negative GI ROS,,,(+)     substance abuse  alcohol use  Endo/Other   diabetes    Renal/GU Renal InsufficiencyRenal disease  negative genitourinary   Musculoskeletal negative musculoskeletal ROS (+)    Abdominal   Peds negative pediatric ROS (+)  Hematology negative hematology ROS (+)   Anesthesia Other Findings   Reproductive/Obstetrics negative OB ROS                              Anesthesia Physical Anesthesia Plan  ASA: 4  Anesthesia Plan: General   Post-op Pain Management:    Induction: Intravenous  PONV Risk Score and Plan: 2 and Treatment may vary due to age or medical condition  Airway Management Planned: Natural Airway and Simple Face Mask  Additional Equipment: None  Intra-op Plan:   Post-operative Plan:   Informed Consent: I have reviewed the patients History and Physical, chart, labs and discussed the procedure including the risks, benefits and alternatives for the proposed anesthesia with the patient or authorized representative who has indicated his/her understanding and acceptance.     Dental advisory given  Plan Discussed with: CRNA  Anesthesia Plan Comments:          Anesthesia Quick Evaluation

## 2024-03-16 NOTE — H&P (View-Only) (Signed)
 Progress Note  Patient Name: Justin [name] Date of Encounter: 03/16/2024  Primary Cardiologist: Shelda Bruckner, MD  Interval Summary  Chart reviewed.  Spoke with patient with use of video Spanish interpreter.  He does not report any sense of chest pain or shortness of breath at rest, no palpitations.  We went over the risks and benefits of TEE cardioversion which is scheduled for tomorrow.  Net output of approximately 400 cc with incomplete urine collection over the last 24 hours.  Co-ox up to 64.  Vital Signs  Vitals:   03/15/24 2323 03/16/24 0400 03/16/24 0510 03/16/24 0720  BP: (!) 140/96 (!) 115/92  (!) 131/109  Pulse: (!) 114 (!) 56  (!) 113  Resp: 18 18  18   Temp: 98.5 F (36.9 C) 98.1 F (36.7 C)  98.1 F (36.7 C)  TempSrc: Oral Oral  Oral  SpO2: 96% 93%  93%  Weight:   103.2 kg   Height:        Intake/Output Summary (Last 24 hours) at 03/16/2024 0845 Last data filed at 03/16/2024 0700 Gross per 24 hour  Intake 815.19 ml  Output 1296 ml  Net -480.81 ml   Filed Weights   03/14/24 0420 03/15/24 0418 03/16/24 0510  Weight: 103.4 kg 103.6 kg 103.2 kg    Physical Exam  GEN: No acute distress.   Neck: No JVD. Cardiac: Rapid regular rhythm, no gallop.  Respiratory: Nonlabored. Clear to auscultation bilaterally. GI: Soft, nontender, bowel sounds present. MS: No edema. Neuro:  Nonfocal. Psych: Alert and oriented x 3. Normal affect.  ECG/Telemetry  Telemetry reviewed showing atypical atrial flutter with 2:1 block.  Labs  Chemistry Recent Labs  Lab 03/09/24 2059 03/10/24 0222 03/14/24 1407 03/15/24 0525 03/16/24 0545  NA  --    < > 137 138 138  K  --    < > 4.4 3.4* 3.7  CL  --    < > 96* 101 103  CO2  --    < > 29 26 25   GLUCOSE  --    < > 154* 124* 124*  BUN  --    < > 65* 60* 53*  CREATININE  --    < > 3.28* 2.97* 2.75*  CALCIUM   --    < > 9.1 8.8* 9.0  PROT 6.3*  --   --   --   --   ALBUMIN 2.8*  --   --   --   --   AST  36  --   --   --   --   ALT 30  --   --   --   --   ALKPHOS 89  --   --   --   --   BILITOT 1.0  --   --   --   --   GFRNONAA  --    < > 23* 26* 28*  ANIONGAP  --    < > 12 11 10    < > = values in this interval not displayed.    Hematology Recent Labs  Lab 03/14/24 0550 03/15/24 0525 03/16/24 0545  WBC 7.1 6.3 7.2  RBC 5.85* 5.74 5.71  HGB 16.0 16.1 16.0  HCT 51.0 49.8 49.2  MCV 87.2 86.8 86.2  MCH 27.4 28.0 28.0  MCHC 31.4 32.3 32.5  RDW 14.9 14.9 15.0  PLT 214 184 195   Cardiac Enzymes Recent Labs  Lab 03/09/24 1842 03/09/24 2059  TROPONINIHS 40* 44*  Lipid Panel     Component Value Date/Time   CHOL 73 03/10/2024 0222   CHOL 265 (H) 08/23/2018 1004   TRIG 93 03/10/2024 0222   HDL 24 (L) 03/10/2024 0222   HDL 41 08/23/2018 1004   CHOLHDL 3.0 03/10/2024 0222   VLDL 19 03/10/2024 0222   LDLCALC 30 03/10/2024 0222   LDLCALC Comment 08/23/2018 1004   LABVLDL Comment 08/23/2018 1004    Cardiac Studies  Echocardiogram 03/11/2024:  1. No left ventricular thrombus is seen, but Definity  contrast was not  used. Left ventricular ejection fraction, by estimation, is 20 to 25%. The  left ventricle has severely decreased function. The left ventricle  demonstrates global hypokinesis. The  left ventricular internal cavity size was mildly dilated. There is  moderate concentric left ventricular hypertrophy. Indeterminate diastolic  filling due to E-A fusion.   2. Right ventricular systolic function is severely reduced. The right  ventricular size is severely enlarged. There is moderately elevated  pulmonary artery systolic pressure. The estimated right ventricular  systolic pressure is 59.1 mmHg.   3. Left atrial size was severely dilated.   4. Right atrial size was severely dilated.   5. The mitral valve is abnormal. Moderate to severe mitral valve  regurgitation.   6. Tricuspid valve regurgitation is mild to moderate.   7. The aortic valve is tricuspid. Aortic valve  regurgitation is not  visualized. No aortic stenosis is present.   8. The inferior vena cava is dilated in size with <50% respiratory  variability, suggesting right atrial pressure of 15 mmHg.   Assessment & Plan  1.  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  Hemodynamics stable with supportive measures, follow-up co-ox 64 today.  CVP under 10.  Remains on IV milrinone  along with Jardiance , hydralazine , and Imdur .  No diuretics at this time..  2.  Atypical atrial flutter with 2:1 block.  CHA2DS2-VASc score is 3.  He remains on IV amiodarone  along with Xarelto .  3.  Acute renal failure with history of CKD stage IIIa.  Creatinine down to 2.97 and GFR 26.  He remains in atypical atrial flutter and is otherwise hemodynamically stable.  Continue milrinone  at 0.25 mcg/kg/min, Xarelto  15 mg daily, Imdur  30 mg daily, hydralazine  50 mg 3 times a day, and Jardiance  10 mg daily.  Plan is for TEE guided cardioversion tomorrow.  For questions or updates, please contact [facility] Please consult [email or web address] for contact info under   Signed, Jayson Sierras, MD  03/16/2024, 8:45 AM

## 2024-03-16 NOTE — Progress Notes (Signed)
 Progress Note  Patient Name: Justin [name] Date of Encounter: 03/16/2024  Primary Cardiologist: Shelda Bruckner, MD  Interval Summary  Chart reviewed.  Spoke with patient with use of video Spanish interpreter.  He does not report any sense of chest pain or shortness of breath at rest, no palpitations.  We went over the risks and benefits of TEE cardioversion which is scheduled for tomorrow.  Net output of approximately 400 cc with incomplete urine collection over the last 24 hours.  Co-ox up to 64.  Vital Signs  Vitals:   03/15/24 2323 03/16/24 0400 03/16/24 0510 03/16/24 0720  BP: (!) 140/96 (!) 115/92  (!) 131/109  Pulse: (!) 114 (!) 56  (!) 113  Resp: 18 18  18   Temp: 98.5 F (36.9 C) 98.1 F (36.7 C)  98.1 F (36.7 C)  TempSrc: Oral Oral  Oral  SpO2: 96% 93%  93%  Weight:   103.2 kg   Height:        Intake/Output Summary (Last 24 hours) at 03/16/2024 0845 Last data filed at 03/16/2024 0700 Gross per 24 hour  Intake 815.19 ml  Output 1296 ml  Net -480.81 ml   Filed Weights   03/14/24 0420 03/15/24 0418 03/16/24 0510  Weight: 103.4 kg 103.6 kg 103.2 kg    Physical Exam  GEN: No acute distress.   Neck: No JVD. Cardiac: Rapid regular rhythm, no gallop.  Respiratory: Nonlabored. Clear to auscultation bilaterally. GI: Soft, nontender, bowel sounds present. MS: No edema. Neuro:  Nonfocal. Psych: Alert and oriented x 3. Normal affect.  ECG/Telemetry  Telemetry reviewed showing atypical atrial flutter with 2:1 block.  Labs  Chemistry Recent Labs  Lab 03/09/24 2059 03/10/24 0222 03/14/24 1407 03/15/24 0525 03/16/24 0545  NA  --    < > 137 138 138  K  --    < > 4.4 3.4* 3.7  CL  --    < > 96* 101 103  CO2  --    < > 29 26 25   GLUCOSE  --    < > 154* 124* 124*  BUN  --    < > 65* 60* 53*  CREATININE  --    < > 3.28* 2.97* 2.75*  CALCIUM   --    < > 9.1 8.8* 9.0  PROT 6.3*  --   --   --   --   ALBUMIN 2.8*  --   --   --   --   AST  36  --   --   --   --   ALT 30  --   --   --   --   ALKPHOS 89  --   --   --   --   BILITOT 1.0  --   --   --   --   GFRNONAA  --    < > 23* 26* 28*  ANIONGAP  --    < > 12 11 10    < > = values in this interval not displayed.    Hematology Recent Labs  Lab 03/14/24 0550 03/15/24 0525 03/16/24 0545  WBC 7.1 6.3 7.2  RBC 5.85* 5.74 5.71  HGB 16.0 16.1 16.0  HCT 51.0 49.8 49.2  MCV 87.2 86.8 86.2  MCH 27.4 28.0 28.0  MCHC 31.4 32.3 32.5  RDW 14.9 14.9 15.0  PLT 214 184 195   Cardiac Enzymes Recent Labs  Lab 03/09/24 1842 03/09/24 2059  TROPONINIHS 40* 44*  Lipid Panel     Component Value Date/Time   CHOL 73 03/10/2024 0222   CHOL 265 (H) 08/23/2018 1004   TRIG 93 03/10/2024 0222   HDL 24 (L) 03/10/2024 0222   HDL 41 08/23/2018 1004   CHOLHDL 3.0 03/10/2024 0222   VLDL 19 03/10/2024 0222   LDLCALC 30 03/10/2024 0222   LDLCALC Comment 08/23/2018 1004   LABVLDL Comment 08/23/2018 1004    Cardiac Studies  Echocardiogram 03/11/2024:  1. No left ventricular thrombus is seen, but Definity  contrast was not  used. Left ventricular ejection fraction, by estimation, is 20 to 25%. The  left ventricle has severely decreased function. The left ventricle  demonstrates global hypokinesis. The  left ventricular internal cavity size was mildly dilated. There is  moderate concentric left ventricular hypertrophy. Indeterminate diastolic  filling due to E-A fusion.   2. Right ventricular systolic function is severely reduced. The right  ventricular size is severely enlarged. There is moderately elevated  pulmonary artery systolic pressure. The estimated right ventricular  systolic pressure is 59.1 mmHg.   3. Left atrial size was severely dilated.   4. Right atrial size was severely dilated.   5. The mitral valve is abnormal. Moderate to severe mitral valve  regurgitation.   6. Tricuspid valve regurgitation is mild to moderate.   7. The aortic valve is tricuspid. Aortic valve  regurgitation is not  visualized. No aortic stenosis is present.   8. The inferior vena cava is dilated in size with <50% respiratory  variability, suggesting right atrial pressure of 15 mmHg.   Assessment & Plan  1.  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  Hemodynamics stable with supportive measures, follow-up co-ox 64 today.  CVP under 10.  Remains on IV milrinone  along with Jardiance , hydralazine , and Imdur .  No diuretics at this time..  2.  Atypical atrial flutter with 2:1 block.  CHA2DS2-VASc score is 3.  He remains on IV amiodarone  along with Xarelto .  3.  Acute renal failure with history of CKD stage IIIa.  Creatinine down to 2.97 and GFR 26.  He remains in atypical atrial flutter and is otherwise hemodynamically stable.  Continue milrinone  at 0.25 mcg/kg/min, Xarelto  15 mg daily, Imdur  30 mg daily, hydralazine  50 mg 3 times a day, and Jardiance  10 mg daily.  Plan is for TEE guided cardioversion tomorrow.  For questions or updates, please contact [facility] Please consult [email or web address] for contact info under   Signed, Jayson Sierras, MD  03/16/2024, 8:45 AM

## 2024-03-16 NOTE — Plan of Care (Signed)
  Problem: Education: Goal: Knowledge of General Education information will improve Description: Including pain rating scale, medication(s)/side effects and non-pharmacologic comfort measures Outcome: Progressing   Problem: Clinical Measurements: Goal: Respiratory complications will improve Outcome: Progressing   Problem: Clinical Measurements: Goal: Will remain free from infection Outcome: Progressing   Problem: Clinical Measurements: Goal: Diagnostic test results will improve Outcome: Progressing

## 2024-03-16 NOTE — Progress Notes (Signed)
 HD#6 SUBJECTIVE:  Patient Summary: Justin [name] is a 43 y.o. with a pertinent PMH of biventricular HFrEF (20 to 25%), paroxysmal A-fib on anticoagulation, T2DM, HTN who presents with dyspnea and lower extremity edema and admitted for acute HFrEF exacerbation.His lower extremity edema and dyspnea have improved significantly. He developed a-flutter and cardiology is planning a cardioversion.  Overnight Events: None  Interim History: He has no new complaints today. He slept well. He has not noticed any new swelling in his legs. He had some questions about his cardioversion which were answered. His last bowel movement was today.  Seen by cardiology who recommended cardioversion on Monday if he does not convert.  OBJECTIVE:  Vital Signs: Vitals:   03/15/24 2323 03/16/24 0400 03/16/24 0510 03/16/24 0720  BP: (!) 140/96 (!) 115/92  (!) 131/109  Pulse: (!) 114 (!) 56  (!) 113  Resp: 18 18  18   Temp: 98.5 F (36.9 C) 98.1 F (36.7 C)  98.1 F (36.7 C)  TempSrc: Oral Oral  Oral  SpO2: 96% 93%  93%  Weight:   103.2 kg   Height:       Supplemental O2: Room Air   Filed Weights   03/14/24 0420 03/15/24 0418 03/16/24 0510  Weight: 103.4 kg 103.6 kg 103.2 kg     Intake/Output Summary (Last 24 hours) at 03/16/2024 0944 Last data filed at 03/16/2024 0700 Gross per 24 hour  Intake 815.19 ml  Output 1296 ml  Net -480.81 ml   Net IO Since Admission: -17,602.92 mL [03/16/24 0944]  Physical Exam: Physical Exam Constitutional:      General: He is not in acute distress.    Appearance: He is not ill-appearing.  Cardiovascular:     Rate and Rhythm: Tachycardia present. Rhythm irregular.  Pulmonary:     Effort: Pulmonary effort is normal. No tachypnea.     Breath sounds: Normal breath sounds. No wheezing, rhonchi or rales.  Musculoskeletal:     Right lower leg: No edema.     Left lower leg: No edema.  Neurological:     Mental Status: He is alert.      Patient  Lines/Drains/Airways Status     Active Line/Drains/Airways     Name Placement date Placement time Site Days   PICC Double Lumen 03/11/24 Right Basilic 41 cm 0 cm 03/11/24  8356  -- 5            Pertinent labs and imaging:    Latest Reference Range & Units 03/16/24 05:47  Total hemoglobin 12.0 - 16.0 g/dL 83.6 (H)  Carboxyhemoglobin 0.5 - 1.5 % 1.7 (H)  Methemoglobin 0.0 - 1.5 % <0.7  O2 Saturation % 63.7  (H): Data is abnormally high    Latest Ref Rng & Units 03/16/2024    5:45 AM 03/15/2024    5:25 AM 03/14/2024    5:50 AM  CBC  WBC 4.0 - 10.5 K/uL 7.2  6.3  7.1   Hemoglobin 13.0 - 17.0 g/dL 83.9  83.8  83.9   Hematocrit 39.0 - 52.0 % 49.2  49.8  51.0   Platelets 150 - 400 K/uL 195  184  214        Latest Ref Rng & Units 03/16/2024    5:45 AM 03/15/2024    5:25 AM 03/14/2024    2:07 PM  CMP  Glucose 70 - 99 mg/dL 875  875  845   BUN 6 - 20 mg/dL 53  60  65  Creatinine 0.61 - 1.24 mg/dL 7.24  7.02  6.71   Sodium 135 - 145 mmol/L 138  138  137   Potassium 3.5 - 5.1 mmol/L 3.7  3.4  4.4   Chloride 98 - 111 mmol/L 103  101  96   CO2 22 - 32 mmol/L 25  26  29    Calcium  8.9 - 10.3 mg/dL 9.0  8.8  9.1     No results found.  ASSESSMENT/PLAN:  Assessment: Principal Problem:   Acute hypoxic respiratory failure (HCC) Active Problems:   Essential hypertension   Acute on chronic systolic (congestive) heart failure (HCC)   Type 2 diabetes mellitus with hyperlipidemia (HCC)   AKI (acute kidney injury) (HCC)   Paroxysmal atrial fibrillation with RVR (HCC)   Plan: #Acute hypoxic respiratory failure due to combined systolic and diastolic heart failure exacerbation, low output state   Presented with shortness of breath and bilateral lower extremity swelling. Lactic acid normal. New echo on 7/1 showed worsening right ventricular systolic function and worsening mitral insufficiency. Heart failure team is onboard. Likely with low-output state reflected by low co-oximetry on 7/1. He  was started on Milrinone  at that time.SABRA His Coox today is 63.7, improved from yesterday He has multiple social barriers including poor social support and lack of insurance. Net neutral over the last 24 hours. He is euvolemic on exam today.   Plan: -daily weights -holding BB for now -atorvastatin  20mg  -Hold Lasix  and Acetazolamide  given euvolemia -Milrinone  0.25  -Afterload reduction with hydralazine  50mg  and Imdur  30mg  -heart failure team on board, appreciate recs. -Jardiance  10mg    #Hypokalemia  K was 3.7 today - K given with goal of K>4   #Paroxysmal A-fib #2:1 Atrial flutter #Tachycardia Recently started warfarin instead of Xarelto , had previously been taking Xarelto  due to daily dosing and no repeated checks. INR greater than 10 on admission, INR after vitamin K  2.8. Given Vitamin K  for anticoagulation reversal x2.  INR up to 4.3 on 7/3. 3.1 on 7/4. CHA2DS2-VASc of 3. Telemetry today showed a-flutter.    Plan -Renally dosed Xarelto  to 15mg  today given worsening CrClearance -amiodarone  IV - Cardiology planning for TEE guided cardioversion tomorrow.    #AKI on CKD stage IIIa -Creatinine increased to 3.29 on admission from baseline of 1.6 in December 2024 -renal U/S showed echogenic kidneys compatible with chronic medical renal disease, new or progressed since 2023. No hydronephrosis or acute renal finding. -likely cardiorenal AKI followed by overdiuresis -Creatinine 2.75 this morning, decreased from 2.97 on 7/6   Plan -strict ins and outs -holding diuresis     #T2DM -Started on glimeperide 2mg  prior to admission -A1c 10.3 this admission -received 6 units of correctional insulin  yesterday -may need to adjust medication prior to discharge   Plan: -Moderate SSI -Jardiance  10mg    #HTN -BP well controlled today  -holding BB for now with acute exacerbation, waiting for additional GDMT given kidney function, continue diuresis -Hydralazine  and  Imdur   #Anxiety #Insomnia -Melatonin 3mg  at bed time -PRN Hydroxyzine  25mg  TID for anxiety    Best Practice: Diet: Cardiac diet VTE: Xarelto  15mg  Code: Full  Disposition planning: Therapy Recs: None,  DISPO: Anticipated discharge in 2-4 days to Home pending weaning Milrinone  and pending cardioversion.  Signature:  Melvenia Napoleon [facility]  9:44 AM, 03/16/2024  On Call pager [phone]

## 2024-03-17 ENCOUNTER — Other Ambulatory Visit (HOSPITAL_COMMUNITY): Payer: Self-pay

## 2024-03-17 ENCOUNTER — Encounter (HOSPITAL_COMMUNITY): Payer: Self-pay

## 2024-03-17 ENCOUNTER — Encounter (HOSPITAL_COMMUNITY): Payer: Self-pay | Admitting: Cardiology

## 2024-03-17 ENCOUNTER — Encounter (HOSPITAL_COMMUNITY)
Admission: EM | Disposition: A | Discharge status: Home / Self Care | Payer: Self-pay | Source: Home / Self Care | Attending: Internal Medicine

## 2024-03-17 ENCOUNTER — Inpatient Hospital Stay (HOSPITAL_COMMUNITY): Payer: Self-pay

## 2024-03-17 DIAGNOSIS — I081 Rheumatic disorders of both mitral and tricuspid valves: Secondary | ICD-10-CM

## 2024-03-17 DIAGNOSIS — I130 Hypertensive heart and chronic kidney disease with heart failure and stage 1 through stage 4 chronic kidney disease, or unspecified chronic kidney disease: Secondary | ICD-10-CM

## 2024-03-17 DIAGNOSIS — N182 Chronic kidney disease, stage 2 (mild): Secondary | ICD-10-CM

## 2024-03-17 DIAGNOSIS — I340 Nonrheumatic mitral (valve) insufficiency: Secondary | ICD-10-CM

## 2024-03-17 DIAGNOSIS — I484 Atypical atrial flutter: Secondary | ICD-10-CM

## 2024-03-17 DIAGNOSIS — I361 Nonrheumatic tricuspid (valve) insufficiency: Secondary | ICD-10-CM

## 2024-03-17 DIAGNOSIS — I5023 Acute on chronic systolic (congestive) heart failure: Secondary | ICD-10-CM

## 2024-03-17 HISTORY — PX: CARDIOVERSION: EP1203

## 2024-03-17 HISTORY — PX: TRANSESOPHAGEAL ECHOCARDIOGRAM (CATH LAB): EP1270

## 2024-03-17 LAB — BASIC METABOLIC PANEL WITH GFR
Anion gap: 11 (ref 5–15)
BUN: 49 mg/dL — ABNORMAL HIGH (ref 6–20)
CO2: 26 mmol/L (ref 22–32)
Calcium: 9.3 mg/dL (ref 8.9–10.3)
Chloride: 104 mmol/L (ref 98–111)
Creatinine, Ser: 2.76 mg/dL — ABNORMAL HIGH (ref 0.61–1.24)
GFR, Estimated: 28 mL/min — ABNORMAL LOW (ref >60)
Glucose, Bld: 126 mg/dL — ABNORMAL HIGH (ref 70–99)
Potassium: 3.9 mmol/L (ref 3.5–5.1)
Sodium: 141 mmol/L (ref 135–145)

## 2024-03-17 LAB — GLUCOSE, CAPILLARY
Glucose-Capillary: 144 mg/dL — ABNORMAL HIGH (ref 70–99)
Glucose-Capillary: 139 mg/dL — ABNORMAL HIGH (ref 70–99)
Glucose-Capillary: 176 mg/dL — ABNORMAL HIGH (ref 70–99)
Glucose-Capillary: 183 mg/dL — ABNORMAL HIGH (ref 70–99)

## 2024-03-17 LAB — COOXEMETRY PANEL
Carboxyhemoglobin: 1.7 % — ABNORMAL HIGH (ref 0.5–1.5)
Methemoglobin: <0.7 % (ref 0.0–1.5)
O2 Saturation: 64.9 %
Total hemoglobin: 15.9 g/dL (ref 12.0–16.0)

## 2024-03-17 LAB — ECHO TEE

## 2024-03-17 LAB — MAGNESIUM: Magnesium: 2.2 mg/dL (ref 1.7–2.4)

## 2024-03-17 SURGERY — TRANSESOPHAGEAL ECHOCARDIOGRAM (TEE) (CATHLAB)
Anesthesia: General

## 2024-03-17 MED ORDER — POTASSIUM CHLORIDE CRYS ER 20 MEQ PO TBCR
40.0000 meq | EXTENDED_RELEASE_TABLET | Freq: Every day | ORAL | Status: DC
  Administered 2024-03-17 – 2024-03-19 (×3): 40 meq via ORAL
  Filled 2024-03-17 (×3): qty 2

## 2024-03-17 MED ORDER — PHENYLEPHRINE HCL-NACL 20-0.9 MG/250ML-% IV SOLN
INTRAVENOUS | Status: DC | PRN
Start: 2024-03-17 — End: 2024-03-17
  Administered 2024-03-17: 20 ug/min via INTRAVENOUS

## 2024-03-17 MED ORDER — TORSEMIDE 20 MG PO TABS
40.0000 mg | ORAL_TABLET | Freq: Every day | ORAL | Status: DC
  Administered 2024-03-17: 40 mg via ORAL
  Filled 2024-03-17: qty 2

## 2024-03-17 MED ORDER — MILRINONE LACTATE IN DEXTROSE 20-5 MG/100ML-% IV SOLN: 0.1250 ug/kg/min | INTRAVENOUS | Status: DC

## 2024-03-17 MED ORDER — LACTATED RINGERS IV SOLN: INTRAVENOUS | Status: DC | PRN

## 2024-03-17 MED ORDER — SODIUM CHLORIDE 0.9 % IV SOLN: INTRAVENOUS | Status: DC

## 2024-03-17 MED ORDER — LIDOCAINE 2% (20 MG/ML) 5 ML SYRINGE
INTRAMUSCULAR | Status: DC | PRN
  Administered 2024-03-17: 100 mg via INTRAVENOUS

## 2024-03-17 MED ORDER — ISOSORBIDE MONONITRATE ER 60 MG PO TB24
60.0000 mg | ORAL_TABLET | Freq: Every day | ORAL | Status: DC
  Administered 2024-03-17: 60 mg via ORAL
  Filled 2024-03-17 (×2): qty 1

## 2024-03-17 MED ORDER — PROPOFOL 500 MG/50ML IV EMUL
INTRAVENOUS | Status: DC | PRN
  Administered 2024-03-17: 150 ug/kg/min via INTRAVENOUS

## 2024-03-17 MED ORDER — PROPOFOL 10 MG/ML IV BOLUS
INTRAVENOUS | Status: DC | PRN
  Administered 2024-03-17 (×2): 30 mg via INTRAVENOUS

## 2024-03-17 MED ORDER — HYDRALAZINE HCL 50 MG PO TABS
75.0000 mg | ORAL_TABLET | Freq: Three times a day (TID) | ORAL | Status: DC
  Administered 2024-03-17 (×2): 75 mg via ORAL
  Filled 2024-03-17 (×3): qty 1

## 2024-03-17 SURGICAL SUPPLY — 1 items: PAD DEFIB RADIO PHYSIO CONN (PAD) ×1 IMPLANT

## 2024-03-17 NOTE — Interval H&P Note (Signed)
 History and Physical Interval Note:  03/17/2024 8:18 AM  Justin [name]  has presented today for surgery, with the diagnosis of afib.  The various methods of treatment have been discussed with the patient and family. After consideration of risks, benefits and other options for treatment, the patient has consented to  Procedure(s): TRANSESOPHAGEAL ECHOCARDIOGRAM (N/A) CARDIOVERSION (N/A) as a surgical intervention.  The patient's history has been reviewed, patient examined, no change in status, stable for surgery.  I have reviewed the patient's chart and labs.  Questions were answered to the patient's satisfaction.     Nichollas Perusse [employer]

## 2024-03-17 NOTE — Transfer of Care (Signed)
 Immediate Anesthesia Transfer of Care Note  Patient: Justin [name]  Procedure(s) Performed: TRANSESOPHAGEAL ECHOCARDIOGRAM CARDIOVERSION  Patient Location: Cath Lab  Anesthesia Type:MAC  Level of Consciousness: awake, alert , and oriented  Airway & Oxygen Therapy: Patient Spontanous Breathing and Patient connected to nasal cannula oxygen  Post-op Assessment: Report given to RN and Post -op Vital signs reviewed and stable  Post vital signs: Reviewed and stable  Last Vitals:  Vitals Value Taken Time  BP 130/111 03/17/24 08:55  Temp 36   Pulse 93 03/17/24 09:00  Resp 23 03/17/24 09:00  SpO2 94 % 03/17/24 09:00  Vitals shown include unfiled device data.  Last Pain:  Vitals:   03/17/24 0816  TempSrc:   PainSc: 0-No pain         Complications: No notable events documented.

## 2024-03-17 NOTE — Anesthesia Postprocedure Evaluation (Signed)
 Anesthesia Post Note  Patient: Justin [name]  Procedure(s) Performed: TRANSESOPHAGEAL ECHOCARDIOGRAM CARDIOVERSION     Patient location during evaluation: PACU Anesthesia Type: MAC Level of consciousness: awake and alert Pain management: pain level controlled Vital Signs Assessment: post-procedure vital signs reviewed and stable Respiratory status: spontaneous breathing, nonlabored ventilation, respiratory function stable and patient connected to nasal cannula oxygen Cardiovascular status: stable and blood pressure returned to baseline Postop Assessment: no apparent nausea or vomiting Anesthetic complications: no   No notable events documented.  Last Vitals:  Vitals:   03/17/24 0930 03/17/24 1013  BP: (!) 138/103 (!) 128/99  Pulse: 95 95  Resp: (!) 24 20  Temp:    SpO2: 95% 93%    Last Pain:  Vitals:   03/17/24 0915  TempSrc:   PainSc: 0-No pain                 Thom JONELLE Peoples

## 2024-03-17 NOTE — Progress Notes (Addendum)
 HD#7 SUBJECTIVE:  Patient Summary: Justin [name] is a 43 y.o. 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 He developed a-flutter and underwent TEE with cardioversion today  Overnight Events: None  Interim History: Patient feels subjectively better after cardioversion with cardiology. He has no concerns today. He converted to NSR with cardioversion  OBJECTIVE:  Vital Signs: Vitals:   03/17/24 0925 03/17/24 0930 03/17/24 1013 03/17/24 1129  BP: (!) 144/102 (!) 138/103 (!) 128/99 (!) 137/101  Pulse: 96 95 95 100  Resp: (!) 24 (!) 24 20 20   Temp:   97.9 F (36.6 C) 98 F (36.7 C)  TempSrc:   Oral Oral  SpO2: 97% 95% 93% 97%  Weight:      Height:       Supplemental O2: Room Air  Filed Weights   03/15/24 0418 03/16/24 0510 03/17/24 0338  Weight: 103.6 kg 103.2 kg 104 kg     Intake/Output Summary (Last 24 hours) at 03/17/2024 1308 Last data filed at 03/17/2024 1245 Gross per 24 hour  Intake 327.17 ml  Output 1200 ml  Net -872.83 ml   Net IO Since Admission: -18,475.75 mL [03/17/24 1308]  Physical Exam: Physical Exam Constitutional:      General: He is not in acute distress.    Appearance: He is not ill-appearing.  Cardiovascular:     Rate and Rhythm: Regular rhythm. Tachycardia present.     Heart sounds:     No systolic murmur is present.  Pulmonary:     Effort: No respiratory distress.     Breath sounds: Normal breath sounds.  Musculoskeletal:     Right lower leg: No edema.     Left lower leg: No edema.  Neurological:     Mental Status: He is alert.     Patient Lines/Drains/Airways Status     Active Line/Drains/Airways     Name Placement date Placement time Site Days   PICC Double Lumen 03/11/24 Right Basilic 41 cm 0 cm 03/11/24  8356  --  6            Pertinent labs and imaging:     Latest Ref Rng & Units 03/16/2024    5:45 AM 03/15/2024    5:25 AM 03/14/2024    5:50 AM  CBC  WBC 4.0 - 10.5 K/uL 7.2  6.3  7.1   Hemoglobin 13.0 - 17.0 g/dL 83.9  83.8  83.9   Hematocrit 39.0 - 52.0 % 49.2  49.8  51.0   Platelets 150 - 400 K/uL 195  184  214        Latest Ref Rng & Units 03/17/2024    4:54 AM 03/16/2024    5:45 AM 03/15/2024    5:25 AM  CMP  Glucose 70 - 99 mg/dL 873  875  875   BUN 6 - 20 mg/dL 49  53  60   Creatinine 0.61 - 1.24 mg/dL 7.23  7.24  7.02   Sodium 135 - 145 mmol/L 141  138  138   Potassium 3.5 - 5.1 mmol/L 3.9  3.7  3.4   Chloride 98 - 111 mmol/L 104  103  101   CO2 22 - 32 mmol/L 26  25  26    Calcium  8.9 - 10.3 mg/dL 9.3  9.0  8.8  ECHO TEE Result Date: 03/17/2024    TRANSESOPHOGEAL ECHO REPORT   Patient Name:   Justin [name] Date of Exam: 03/17/2024 Medical Rec #:  [id number]                      Height:       68.0 in Accession #:    [id number]                     Weight:       229.3 lb Date of Birth:  [birth year]                      BSA:          2.166 m Patient Age:    43 years                       BP:           145/110 mmHg Patient Gender: M                              HR:           109 bpm. Exam Location:  Inpatient Procedure: 3D Echo, Transesophageal Echo, Cardiac Doppler and Limited Color            Doppler (Both Spectral and Color Flow Doppler were utilized during            procedure). Indications:     Mitral Regurgitation  History:         Patient has prior history of Echocardiogram examinations, most                  recent 03/11/2024. CHF, Chronic Kidney Disease; Risk                  Factors:Hypertension and Diabetes.  Sonographer:     Koleen Popper RDCS Sonographer#2:   Philomena Daring RDCS Referring Phys:  [id number] VINIE BROCKS HILTY Diagnosing Phys: Ezra Kanner PROCEDURE: After discussion of the risks and benefits of a TEE, an informed consent was obtained from the patient. The  transesophogeal probe was passed without difficulty through the esophogus of the patient. Imaged were obtained with the patient in a left lateral decubitus position. Sedation performed by different physician. The patient was monitored while under deep sedation. Anesthestetic sedation was provided intravenously by Anesthesiology: 375mg  of Propofol , 100mg  of Lidocaine . The patient developed no complications during the procedure. A successful direct current cardioversion was performed at 200 joules with 1 attempt.  IMPRESSIONS  1. Left ventricular ejection fraction, by estimation, is 20 to 25%. The left ventricle has severely decreased function. The left ventricle demonstrates global hypokinesis. The left ventricular internal cavity size was mildly dilated. There is mild concentric left ventricular hypertrophy.  2. Peak RV-RA gradient 30 mmHg. Right ventricular systolic function is moderately reduced. The right ventricular size is normal.  3. Left atrial size was moderately dilated. No left atrial/left atrial appendage thrombus was detected.  4. Right atrial size was mildly dilated.  5. No PFO or ASD by color doppler or bubbles.  6. The aortic valve is tricuspid. Aortic valve regurgitation is not visualized. No aortic stenosis is present.  7. There was eccentric, posteriorly-directed mitral regurgitation with restricted posterior mitral leaflet. MR appeared severe with PISA ERO 0.6 cm^2. There was systolic flow reversal noted in the pulmonary  vein doppler pattern. No evidence of mitral stenosis. The mean mitral valve gradient is 3.0 mmHg.  8. 3D performed of the mitral valve. FINDINGS  Left Ventricle: Left ventricular ejection fraction, by estimation, is 20 to 25%. The left ventricle has severely decreased function. The left ventricle demonstrates global hypokinesis. The left ventricular internal cavity size was mildly dilated. There is mild concentric left ventricular hypertrophy. Right Ventricle: Peak RV-RA gradient  30 mmHg. The right ventricular size is normal. No increase in right ventricular wall thickness. Right ventricular systolic function is moderately reduced. Left Atrium: Left atrial size was moderately dilated. No left atrial/left atrial appendage thrombus was detected. Right Atrium: Right atrial size was mildly dilated. Pericardium: There is no evidence of pericardial effusion. Mitral Valve: There was eccentric, posteriorly-directed mitral regurgitation with restricted posterior mitral leaflet. MR appeared severe with PISA ERO 0.6 cm^2. There was systolic flow reversal noted in the pulmonary vein systolic doppler pattern. The mitral valve is abnormal. Severe mitral valve regurgitation. No evidence of mitral valve stenosis. MV peak gradient, 6.2 mmHg. The mean mitral valve gradient is 3.0 mmHg. Tricuspid Valve: The tricuspid valve is normal in structure. Tricuspid valve regurgitation is mild. Aortic Valve: The aortic valve is tricuspid. Aortic valve regurgitation is not visualized. No aortic stenosis is present. Pulmonic Valve: The pulmonic valve was normal in structure. Pulmonic valve regurgitation is not visualized. Aorta: The aortic root is normal in size and structure. IAS/Shunts: No PFO or ASD by color doppler or bubbles. Additional Comments: Spectral Doppler performed.  AORTA Ao Asc diam: 3.60 cm MITRAL VALVE            TRICUSPID VALVE MV Peak grad: 6.2 mmHg  TR Peak grad:   30.2 mmHg MV Mean grad: 3.0 mmHg  TR Vmax:        275.00 cm/s MV Vmax:      1.25 m/s MV Vmean:     75.4 cm/s Dalton McleanMD Electronically signed by Ezra Kanner Signature Date/Time: 03/17/2024/9:58:57 AM    Final    EP STUDY Result Date: 03/17/2024 See surgical note for result.   ASSESSMENT/PLAN:  Assessment: Principal Problem:   Acute hypoxic respiratory failure (HCC) Active Problems:   Essential hypertension   Acute on chronic systolic (congestive) heart failure (HCC)   Type 2 diabetes mellitus with hyperlipidemia (HCC)    AKI (acute kidney injury) (HCC)   Paroxysmal atrial fibrillation with RVR (HCC)   Plan: #Acute hypoxic respiratory failure due to combined systolic and diastolic heart failure exacerbation, low output state   Presented with shortness of breath and bilateral lower extremity swelling. Lactic acid normal. New echo on 7/1 showed worsening right ventricular systolic function and worsening mitral insufficiency. Heart failure team is onboard. Likely with low-output state reflected by low co-oximetry on 7/1. He was started on Milrinone  at that time and has required continuous Milrinone  infusion since that time. His Coox today is 64.9, stable from yesterday. He has multiple social barriers including poor social support and lack of insurance. He has been euvolemic on exam for several days   Plan: -daily weights -Jardiance  10 mg, holding BB and further GDMT -atorvastatin  20mg  -Tosemide 40mg  daily -Decrease milrinone  to 0.125 today -Hydralazine  50mg  and Imdur  30mg  -heart failure team on board, appreciate recs.   #Paroxysmal A-fib #2:1 Atrial flutter #Tachycardia Recently started warfarin instead of Xarelto , had previously been taking Xarelto  due to daily dosing and no repeated checks. INR greater than 10 on admission, INR after vitamin K  2.8. Given Vitamin K  for  anticoagulation reversal x2.  INR up to 4.3 on 7/3. 3.1 on 7/4. CHA2DS2-VASc of 3. Telemetry today showed atrial tachycardia before successful cardioversion to sinus rhythm.   Plan -Renally dosed Xarelto  to 15mg  given CrClearance -amiodarone  30mg /hr IV, change to PO after milrinone  is weaned - NPO before procedure   #AKI on CKD stage IIIa -Creatinine increased to 3.29 on admission from baseline of 1.6 in December 2024 -renal U/S showed echogenic kidneys compatible with chronic medical renal disease, new or progressed since 2023. No hydronephrosis or acute renal finding. -likely cardiorenal AKI followed by overdiuresis -Creatinine 2.76  this morning stable from yesterday   Plan -strict ins and outs -Torsemide  40mg  PO      #Hypokalemia K was 3.9 today -Daily BMP  #T2DM -Started on glimeperide 2mg  prior to admission -A1c 10.3 this admission -received 7 units of correctional insulin  yesterday -may need to adjust medication prior to discharge   Plan: -Moderate SSI -Jardiance  10mg    #HTN -BP well controlled today  -holding BB for now with acute exacerbation, waiting for additional GDMT given kidney function, continue diuresis -Hydralazine  and Imdur    #Anxiety #Insomnia -Melatonin 3mg  at bed time -PRN Hydroxyzine  25mg  TID for anxiety  Best Practice: Diet: NPO VTE: Xarelto  15mg  Code: Full  Disposition planning: Therapy Recs: None,  DISPO: Anticipated discharge in 2-3 days to Home pending weaning of IV antibiotics.  Signature:  [name]  1:08 PM, 03/17/2024  On Call pager [phone]

## 2024-03-17 NOTE — Plan of Care (Signed)
  Problem: Education: Goal: Ability to describe self-care measures that may prevent or decrease complications (Diabetes Survival Skills Education) will improve Outcome: Not Progressing Goal: Individualized Educational Video(s) Outcome: Not Progressing   Problem: Coping: Goal: Ability to adjust to condition or change in health will improve Outcome: Not Progressing   Problem: Fluid Volume: Goal: Ability to maintain a balanced intake and output will improve Outcome: Not Progressing   Problem: Health Behavior/Discharge Planning: Goal: Ability to identify and utilize available resources and services will improve Outcome: Not Progressing Goal: Ability to manage health-related needs will improve Outcome: Not Progressing   Problem: Metabolic: Goal: Ability to maintain appropriate glucose levels will improve Outcome: Not Progressing   Problem: Nutritional: Goal: Maintenance of adequate nutrition will improve Outcome: Not Progressing Goal: Progress toward achieving an optimal weight will improve Outcome: Not Progressing   Problem: Skin Integrity: Goal: Risk for impaired skin integrity will decrease Outcome: Not Progressing   Problem: Tissue Perfusion: Goal: Adequacy of tissue perfusion will improve Outcome: Not Progressing   Problem: Education: Goal: Knowledge of General Education information will improve Description: Including pain rating scale, medication(s)/side effects and non-pharmacologic comfort measures Outcome: Not Progressing   Problem: Health Behavior/Discharge Planning: Goal: Ability to manage health-related needs will improve Outcome: Not Progressing   Problem: Clinical Measurements: Goal: Ability to maintain clinical measurements within normal limits will improve Outcome: Not Progressing Goal: Will remain free from infection Outcome: Not Progressing Goal: Diagnostic test results will improve Outcome: Not Progressing Goal: Respiratory complications will  improve Outcome: Not Progressing Goal: Cardiovascular complication will be avoided Outcome: Not Progressing   Problem: Activity: Goal: Risk for activity intolerance will decrease Outcome: Not Progressing   Problem: Nutrition: Goal: Adequate nutrition will be maintained Outcome: Not Progressing   Problem: Coping: Goal: Level of anxiety will decrease Outcome: Not Progressing   Problem: Elimination: Goal: Will not experience complications related to bowel motility Outcome: Not Progressing Goal: Will not experience complications related to urinary retention Outcome: Not Progressing   Problem: Pain Managment: Goal: General experience of comfort will improve and/or be controlled Outcome: Not Progressing   Problem: Safety: Goal: Ability to remain free from injury will improve Outcome: Not Progressing   Problem: Skin Integrity: Goal: Risk for impaired skin integrity will decrease Outcome: Not Progressing   Problem: Education: Goal: Ability to demonstrate management of disease process will improve Outcome: Not Progressing Goal: Ability to verbalize understanding of medication therapies will improve Outcome: Not Progressing Goal: Individualized Educational Video(s) Outcome: Not Progressing   Problem: Activity: Goal: Capacity to carry out activities will improve Outcome: Not Progressing   Problem: Cardiac: Goal: Ability to achieve and maintain adequate cardiopulmonary perfusion will improve Outcome: Not Progressing

## 2024-03-17 NOTE — Plan of Care (Signed)
  Problem: Education: Goal: Ability to describe self-care measures that may prevent or decrease complications (Diabetes Survival Skills Education) will improve Outcome: Progressing   Problem: Coping: Goal: Ability to adjust to condition or change in health will improve Outcome: Progressing   Problem: Skin Integrity: Goal: Risk for impaired skin integrity will decrease Outcome: Progressing   Problem: Tissue Perfusion: Goal: Adequacy of tissue perfusion will improve Outcome: Progressing

## 2024-03-17 NOTE — Progress Notes (Addendum)
 Advanced Heart Failure Rounding Note  Cardiologist: Shelda Bruckner, MD  HF MD: Dr. Rolan Chief Complaint: A/C HFrEF Subjective:    Coox 65. CVP unable to get (would not flush/draw back or zero) on Milrinone  0.25 mcg/kg/min  In atrial tack 110s. Now on amiodarone  gtt.   Laying in bed this morning, questions of TEE/DCCV answered.   Objective:    Weight Range: 104 kg Body mass index is 34.86 kg/m.   Vital Signs:   Temp:  [97.9 F (36.6 C)-99.2 F (37.3 C)] 98 F (36.7 C) (07/07 0338) Pulse Rate:  [111-116] 114 (07/07 0338) Resp:  [18-19] 19 (07/07 0338) BP: (131-145)/(95-109) 145/95 (07/07 0338) SpO2:  [92 %-95 %] 95 % (07/07 0338) Weight:  [104 kg] 104 kg (07/07 0338) Last BM Date : 03/16/24  Weight change: Filed Weights   03/15/24 0418 03/16/24 0510 03/17/24 0338  Weight: 103.6 kg 103.2 kg 104 kg   Intake/Output:  Intake/Output Summary (Last 24 hours) at 03/17/2024 0716 Last data filed at 03/17/2024 0650 Gross per 24 hour  Intake --  Output 1200 ml  Net -1200 ml    Physical Exam  General:  well appearing.  No respiratory difficulty Neck: supple. JVD ~8 cm.  Cor: PMI nondisplaced. Irregular rate & rhythm. No rubs, gallops or murmurs. Lungs: clear Extremities: no cyanosis, clubbing, rash, edema. RUE PICC Neuro: alert & oriented x 3. Moves all 4 extremities w/o difficulty. Affect pleasant.   Telemetry   AT 110s (personally reviewed)  EKG    No new EKG to review  Labs    CBC Recent Labs    03/15/24 0525 03/16/24 0545  WBC 6.3 7.2  HGB 16.1 16.0  HCT 49.8 49.2  MCV 86.8 86.2  PLT 184 195   Basic Metabolic Panel Recent Labs    92/93/74 0545 03/17/24 0454  NA 138 141  K 3.7 3.9  CL 103 104  CO2 25 26  GLUCOSE 124* 126*  BUN 53* 49*  CREATININE 2.75* 2.76*  CALCIUM  9.0 9.3  MG 2.2 2.2   BNP (last 3 results) Recent Labs    08/22/23 0722 03/09/24 1845  BNP 951.5* 2,578.5*   Medications:    Scheduled Medications:   acetaminophen   1,000 mg Oral TID   atorvastatin   20 mg Oral Daily   Chlorhexidine  Gluconate Cloth  6 each Topical Daily   empagliflozin   10 mg Oral Daily   hydrALAZINE   50 mg Oral TID with meals   insulin  aspart  0-15 Units Subcutaneous TID WC   isosorbide  mononitrate  30 mg Oral Daily   melatonin  3 mg Oral QHS   rivaroxaban   15 mg Oral Q supper   sodium chloride  flush  10-40 mL Intracatheter Q12H   Infusions:  sodium chloride      amiodarone  30 mg/hr (03/17/24 0407)   milrinone  0.25 mcg/kg/min (03/16/24 1328)   PRN Medications: HYDROmorphone , hydrOXYzine , mouth rinse, saline, sodium chloride  flush   Patient Profile   Justin [name] is a 43 y.o. Spanish-speaking male with history of chronic systolic CHF, hx uncontrolled HTN, DM II, mitral regurgitation, ETOH abuse, hx crack/cocaine use. AHF team to see for A/C combined systolic and diastolic heart failure   Assessment/Plan   Acute on chronic systolic CHF: Long history of nonischemic cardiomyopathy. Cath in 6/23 with no significant CAD, CI 1.99.  Substance abuse (cocaine and ETOH) thought to play a role in cardiomyopathy, also uncontrolled HTN in the past.  However, cardiac MRI was done in 6/23  showing LVEF 29%, LV severely dilated, asymmetric LVH with basal septum measuring 17 mm, RVEF 45%, patchy LGE in septum and RV insertion sites + subepicardial LGE in inferolateral wall. Scar pattern and asymmetric hypertrophy could be consistent with hypertrophic cardiomyopathy. However, due to subepicardial LGE in inferolateral wall and presence of mediastinal lymphadenopathy, cardiac PET recommended to rule out cardiac sarcoidosis.  This was never done due to lack of insurance.  Echo this admission showed EF 20-25% with moderate concentric LVH, severe RV dysfunction, probably severe functional MR with PISA ERO 0.47 cm^2, IVC dilated.  Patient was admitted with marked volume overload, NYHA class III symptoms and cardiorenal syndrome  with creatinine 3.29 (uncertain of current baseline). Creatinine improving with diuresis. Stable at 2.76 - coox initially 43.9. Started on Milrinone  0.25 mcg/kg/min, now 65%. Decrease milrinone  to 0.125 to increase chances of holding NSR.  - CVP unhooked. Will start Torsemide  40 mg daily  - GDMT will be limited by AKI - Continue hydral 50 mg tid and Imdur  30 mg daily. Likely will not be able to get Bidil w/o insurance. - Continue jardiance  10 mg daily, prev approved for patient assistance - stop digoxin  with Cr.  - not a good candidate for advanced therapies with noncompliance, lack of insurance, RV dysfunction and AKI.   2. Atrial tachycardia/fibrillation Atypical flutter - Paroxysmal. He was started on warfarin at recent admission in [place]  but has not been having his INR checked so INR was > 10 at admission. Resolved.  - tele with what looks like 2:1 AT, HR 120 with 0 HRV.  - continue Xarelto . PA sent.  - Now on amiodarone  gtt, plan to transition to PO once off milrinone   3. ?AKI on CKD stage 3: Suspect cardiorenal syndrome.  Last creatinine here in 12/24 was 1.6.   - Cr 3.29 on admit - down to 2.47 with diuresis, now 2.76 - Follow with PO diuretic  4. Mitral regurgitation: Severe functional MR on echo this admission.  - Continue aggressive diuresis, will recheck after diuresis and titration of GDMT.   5. ETOH/cocaine abuse: UDS negative this admission. Says he has quit.   6. Uncontrolled DM2: HgbA1c 10.3, per primary team.   7. Noncompliance: Has not followed up in HF clinic and generally not taking medications.  Does not have insurance.  - TOC consult placed; need SW assistance  Length of Stay: 7  Beckey LITTIE Coe, NP  03/17/2024, 7:16 AM  Advanced Heart Failure Team Pager [phone] (M-F; 7a - 5p)  Please contact [facility] for night-coverage after hours (5p -7a ) and weekends on [email or web address]  Patient seen with NP, I formulated the plan and agree with the above note.   Co-ox  65% this morning, unable to obtain CVP.  He remains on milrinone  0.25 and amiodarone  gtt 30 mg/hr.  BP generally mildly elevated.   He is in atypical atrial flutter.   No dyspnea.   TEE-guided DCCV this morning, he was cardioverted back to NSR.  Echo showed LV EF 20-25%, moderate RV dysfunction, severe eccentric MR in setting of restricted posterior leaflet with ERO 0.6 cm^2.   General: NAD Neck: No JVD, no thyromegaly or thyroid nodule.  Lungs: Clear to auscultation bilaterally with normal respiratory effort. CV: Lateral PMI.  Heart tachy, regular S1/S2, no S3/S4, 2/6 HSM apex.  No peripheral edema.   Abdomen: Soft, nontender, no hepatosplenomegaly, no distention.  Skin: Intact without lesions or rashes.  Neurologic: Alert and oriented x 3.  Psych: Normal affect. Extremities:  No clubbing or cyanosis.  HEENT: Normal.   1. Acute on chronic systolic CHF: Long history of nonischemic cardiomyopathy. Cath in 6/23 with no significant CAD, CI 1.99.  Substance abuse (cocaine and ETOH) thought to play a role in cardiomyopathy, also uncontrolled HTN in the past.  However, cardiac MRI was done in 6/23 showing LVEF 29%, LV severely dilated, asymmetric LVH with basal septum measuring 17 mm, RVEF 45%, patchy LGE in septum and RV insertion sites + subepicardial LGE in inferolateral wall. Scar pattern and asymmetric hypertrophy could be consistent with hypertrophic cardiomyopathy. However, due to subepicardial LGE in inferolateral wall and presence of mediastinal lymphadenopathy, cardiac PET recommended to rule out cardiac sarcoidosis.  This was never done due to lack of insurance.  Echo this admission showed EF 20-25% with moderate concentric LVH, severe RV dysfunction, probably severe functional MR with PISA ERO 0.47 cm^2, IVC dilated.  Patient was admitted with marked volume overload, NYHA class III symptoms and cardiorenal syndrome with creatinine 3.29 (uncertain of current baseline).  Low output HF,  milrinone  0.25 started and he was diuresed.  Co-ox 65% this morning, he does not appear volume overloaded on exam (CVP not working). BP elevated. GDMT limited by renal dysfunction.  - Decrease milrinone  to 0.125 today.  - Increase hydralazine  to 75 mg tid and Imdur  to 60 mg daily.  - Agree with starting torsemide  40 mg daily.  - Continue Jardiance  10 mg daily.  - Not a good candidate for advanced therapies with noncompliance, lack of insurance, RV dysfunction and AKI.  2. Atrial fibrillation/atypical flutter: He was started on warfarin at recent admission in [place]  but has not been having his INR checked so INR was > 10 at admission.  Now back on Xarelto .  He went into persistent atypical flutter this admission, TEE-guided DCCV back to NSR this morning.  - Continue Xarelto .  - Continue amiodarone  as gtt until we stop milrinone , then transition to po.  - Eventual fibrillation/flutter ablation would be ideal.  3. ?AKI on CKD stage 3: Suspect cardiorenal syndrome.  Last creatinine prior to admission in 12/24 was 1.6.  Creatinine 3.29 at admission, 2.76 currently.  4. Mitral regurgitation: Severe functional MR on echo this admission.  TEE today showed severe eccentric, posteriorly-directed MR with restricted posterior leaflet.  MR ERO 0.6 cm^2 by PISA.  - Will need to follow closely with GDMT titration, if does not improve would favor mTEER but lack of insurance coverage will be an issue.  5. ETOH/cocaine abuse: UDS negative this admission. Says he has quit.  6. Uncontrolled DM2: HgbA1c 10.3, per primary team.  7. Noncompliance: Has not followed up in HF clinic and generally not taking medications.  Does not have insurance.   [name] 03/17/2024 8:54 AM

## 2024-03-17 NOTE — Procedures (Signed)
 Electrical Cardioversion Procedure Note Justin [name] [id number] [birth year]  Procedure: Electrical Cardioversion Indications:  Atrial Flutter  Procedure Details Consent: Risks of procedure as well as the alternatives and risks of each were explained to the (patient/caregiver).  Consent for procedure obtained. Time Out: Verified patient identification, verified procedure, site/side was marked, verified correct patient position, special equipment/implants available, medications/allergies/relevent history reviewed, required imaging and test results available.  Performed  Patient placed on cardiac monitor, pulse oximetry, supplemental oxygen as necessary.  Sedation given: Propofol  per anesthesiology Pacer pads placed anterior and posterior chest.  Cardioverted 1 time(s).  Cardioverted at 150J.  Evaluation Findings: Post procedure EKG shows: NSR Complications: None Patient did tolerate procedure well.   Justin [name] 03/17/2024, 8:42 AM

## 2024-03-17 NOTE — Progress Notes (Signed)
 Echocardiogram Echocardiogram Transesophageal has been performed.  Justin [name], RDCS 03/17/2024, 8:57 AM

## 2024-03-17 NOTE — CV Procedure (Signed)
 Procedure: TEE  Indication: Atypical atrial flutter  Sedation: Per anesthesiology  Findings: Please see echo section for full report.  Mildly dilated LV with mild LV hypertrophy.  Diffuse hypokinesis, LV EF 20-25%.  No LV thrombus noted.  Mildly dilated RV with moderate RV systolic dysfunction.  Moderate left atrial enlargement, no LA appendage thrombus.  Mild right atrial enlargement.  No PFO/ASD by color doppler.  Mild TR, peak RV-RA gradient 30 mmHg.  There was eccentric, posteriorly-directed mitral regurgitation with restricted posterior mitral leaflet.  MR appeared severe with PISA ERO 0.6 cm^2.  There was systolic flow reversal noted in the pulmonary vein systolic doppler pattern.  Trileaflet aortic valve with no stenosis or regurgitation.    Proceed with cardioversion.   [name] 03/17/2024 8:42 AM

## 2024-03-18 LAB — COOXEMETRY PANEL
Carboxyhemoglobin: 1.6 % — ABNORMAL HIGH (ref 0.5–1.5)
Methemoglobin: <0.7 % (ref 0.0–1.5)
O2 Saturation: 63.7 %
Total hemoglobin: 15.7 g/dL (ref 12.0–16.0)

## 2024-03-18 LAB — BASIC METABOLIC PANEL WITH GFR
Anion gap: 11 (ref 5–15)
BUN: 50 mg/dL — ABNORMAL HIGH (ref 6–20)
CO2: 25 mmol/L (ref 22–32)
Calcium: 9.4 mg/dL (ref 8.9–10.3)
Chloride: 103 mmol/L (ref 98–111)
Creatinine, Ser: 2.83 mg/dL — ABNORMAL HIGH (ref 0.61–1.24)
GFR, Estimated: 27 mL/min — ABNORMAL LOW (ref >60)
Glucose, Bld: 221 mg/dL — ABNORMAL HIGH (ref 70–99)
Potassium: 4 mmol/L (ref 3.5–5.1)
Sodium: 139 mmol/L (ref 135–145)

## 2024-03-18 LAB — GLUCOSE, CAPILLARY
Glucose-Capillary: 128 mg/dL — ABNORMAL HIGH (ref 70–99)
Glucose-Capillary: 108 mg/dL — ABNORMAL HIGH (ref 70–99)
Glucose-Capillary: 107 mg/dL — ABNORMAL HIGH (ref 70–99)
Glucose-Capillary: 121 mg/dL — ABNORMAL HIGH (ref 70–99)

## 2024-03-18 LAB — MAGNESIUM: Magnesium: 2.1 mg/dL (ref 1.7–2.4)

## 2024-03-18 MED ORDER — ISOSORBIDE MONONITRATE ER 60 MG PO TB24
90.0000 mg | ORAL_TABLET | Freq: Every day | ORAL | Status: DC
  Administered 2024-03-18 – 2024-03-19 (×2): 90 mg via ORAL
  Filled 2024-03-18 (×2): qty 1

## 2024-03-18 MED ORDER — FUROSEMIDE 10 MG/ML IJ SOLN
80.0000 mg | Freq: Once | INTRAMUSCULAR | Status: AC
  Administered 2024-03-18: 80 mg via INTRAVENOUS
  Filled 2024-03-18: qty 8

## 2024-03-18 MED ORDER — TORSEMIDE 20 MG PO TABS
40.0000 mg | ORAL_TABLET | Freq: Two times a day (BID) | ORAL | Status: DC
  Administered 2024-03-18 – 2024-03-19 (×2): 40 mg via ORAL
  Filled 2024-03-18 (×2): qty 2

## 2024-03-18 MED ORDER — AMIODARONE HCL 200 MG PO TABS: 200.0000 mg | ORAL_TABLET | Freq: Every day | ORAL | Status: DC

## 2024-03-18 MED ORDER — HYDRALAZINE HCL 50 MG PO TABS
100.0000 mg | ORAL_TABLET | Freq: Three times a day (TID) | ORAL | Status: DC
  Administered 2024-03-18 – 2024-03-19 (×5): 100 mg via ORAL
  Filled 2024-03-18 (×5): qty 2

## 2024-03-18 MED ORDER — AMIODARONE HCL 200 MG PO TABS: 200.0000 mg | ORAL_TABLET | Freq: Two times a day (BID) | ORAL | Status: DC

## 2024-03-18 MED ORDER — AMIODARONE HCL 200 MG PO TABS
400.0000 mg | ORAL_TABLET | Freq: Two times a day (BID) | ORAL | Status: DC
  Administered 2024-03-18 – 2024-03-19 (×3): 400 mg via ORAL
  Filled 2024-03-18 (×3): qty 2

## 2024-03-18 NOTE — Progress Notes (Addendum)
 Advanced Heart Failure Rounding Note  Cardiologist: Shelda Bruckner, MD  HF MD: Dr. Rolan Chief Complaint: A/C HFrEF Subjective:    Coox pending. CVP 10/11 on Milrinone  0.125 mcg/kg/min  S/p TEE/DCCV yesterday. Maintaining NSR/ST this morning.   Resting in bed. Denies CP/SOB.   Objective:    Weight Range: 103.6 kg Body mass index is 34.73 kg/m.   Vital Signs:   Temp:  [97.9 F (36.6 C)-99.8 F (37.7 C)] 98.1 F (36.7 C) (07/08 0457) Pulse Rate:  [85-108] 92 (07/08 0457) Resp:  [18-29] 20 (07/08 0457) BP: (120-146)/(88-112) 137/107 (07/08 0457) SpO2:  [92 %-98 %] 94 % (07/08 0457) Weight:  [103.6 kg] 103.6 kg (07/08 0457) Last BM Date : 03/17/24  Weight change: Filed Weights   03/16/24 0510 03/17/24 0338 03/18/24 0457  Weight: 103.2 kg 104 kg 103.6 kg   Intake/Output:  Intake/Output Summary (Last 24 hours) at 03/18/2024 0726 Last data filed at 03/18/2024 0502 Gross per 24 hour  Intake 583.07 ml  Output 1075 ml  Net -491.93 ml    Physical Exam  General:  well appearing.  No respiratory difficulty Neck: supple. JVD ~10 cm.  Cor: PMI nondisplaced. Irregular rate & rhythm. No rubs, gallops or murmurs. Lungs: clear Extremities: no cyanosis, clubbing, rash, non-pitting BLE edema. RUE PICC Neuro: alert & oriented x 3. Moves all 4 extremities w/o difficulty. Affect pleasant.   Telemetry   ST low 100s (personally reviewed)  EKG    No new EKG to review  Labs    CBC Recent Labs    03/16/24 0545  WBC 7.2  HGB 16.0  HCT 49.2  MCV 86.2  PLT 195   Basic Metabolic Panel Recent Labs    92/93/74 0545 03/17/24 0454  NA 138 141  K 3.7 3.9  CL 103 104  CO2 25 26  GLUCOSE 124* 126*  BUN 53* 49*  CREATININE 2.75* 2.76*  CALCIUM  9.0 9.3  MG 2.2 2.2   BNP (last 3 results) Recent Labs    08/22/23 0722 03/09/24 1845  BNP 951.5* 2,578.5*   Medications:    Scheduled Medications:  acetaminophen   1,000 mg Oral TID   atorvastatin   20 mg Oral  Daily   Chlorhexidine  Gluconate Cloth  6 each Topical Daily   empagliflozin   10 mg Oral Daily   hydrALAZINE   75 mg Oral TID with meals   insulin  aspart  0-15 Units Subcutaneous TID WC   isosorbide  mononitrate  60 mg Oral Daily   melatonin  3 mg Oral QHS   potassium chloride   40 mEq Oral Daily   rivaroxaban   15 mg Oral Q supper   sodium chloride  flush  10-40 mL Intracatheter Q12H   torsemide   40 mg Oral Daily   Infusions:  amiodarone  30 mg/hr (03/17/24 1618)   milrinone  0.125 mcg/kg/min (03/17/24 1130)   PRN Medications: HYDROmorphone , hydrOXYzine , mouth rinse, saline, sodium chloride  flush   Patient Profile   [place] Hijazi Solorio is a 43 y.o. Spanish-speaking male with history of chronic systolic CHF, hx uncontrolled HTN, DM II, mitral regurgitation, ETOH abuse, hx crack/cocaine use. AHF team to see for A/C combined systolic and diastolic heart failure   Assessment/Plan   Acute on chronic systolic CHF: Long history of nonischemic cardiomyopathy. Cath in 6/23 with no significant CAD, CI 1.99.  Substance abuse (cocaine and ETOH) thought to play a role in cardiomyopathy, also uncontrolled HTN in the past.  However, cardiac MRI was done in 6/23 showing LVEF 29%, LV severely  dilated, asymmetric LVH with basal septum measuring 17 mm, RVEF 45%, patchy LGE in septum and RV insertion sites + subepicardial LGE in inferolateral wall. Scar pattern and asymmetric hypertrophy could be consistent with hypertrophic cardiomyopathy. However, due to subepicardial LGE in inferolateral wall and presence of mediastinal lymphadenopathy, cardiac PET recommended to rule out cardiac sarcoidosis.  This was never done due to lack of insurance.  Echo this admission showed EF 20-25% with moderate concentric LVH, severe RV dysfunction, probably severe functional MR with PISA ERO 0.47 cm^2, IVC dilated.  Patient was admitted with marked volume overload, NYHA class III symptoms and cardiorenal syndrome with  creatinine 3.29 (uncertain of current baseline). Creatinine improving with diuresis. Stable at 2.76 - TEE-guided DCCV 03/17/24, he was cardioverted back to NSR.  TEE showed LV EF 20-25%, moderate RV dysfunction, severe eccentric MR in setting of restricted posterior leaflet with ERO 0.6 cm^2.  - coox initially 43.9. Started on Milrinone  0.25 mcg/kg/min, now (stat co-ox ordered)%. Continue milrinone  0.125, plan to stop today - CVP 10/11. Give 80 IV lasix  x1 this morning. Increase Torsemide  40 mg daily> BID this afternoon - GDMT will be limited by AKI - Increase hydral 75>100 mg tid and Imdur  60>90 mg daily. Likely will not be able to get Bidil w/o insurance. - Continue jardiance  10 mg daily, prev approved for patient assistance - stop digoxin  with Cr.  - not a good candidate for advanced therapies with noncompliance, lack of insurance, RV dysfunction and AKI.   2. Atrial tachycardia/fibrillation Atypical flutter - Paroxysmal. He was started on warfarin at recent admission in [place]  but has not been having his INR checked so INR was > 10 at admission. Resolved.  - TEE/DCCV yesterday,  tele with ST low 100s - continue Xarelto . PA sent.  - Now on amiodarone  gtt, plan to transition to PO once off milrinone  (400 mg BID x1 week > 200 mg BID x1 week> 200 mg daily)  3. ?AKI on CKD stage 3: Suspect cardiorenal syndrome.  Last creatinine here in 12/24 was 1.6.   - Cr 3.29 on admit - down to 2.47 with diuresis, last 2.76. Pending today - Follow with PO diuretic  4. Mitral regurgitation: Severe functional MR on echo this admission.  - Continue aggressive diuresis, will recheck after diuresis and titration of GDMT.   5. ETOH/cocaine abuse: UDS negative this admission. Says he has quit.   6. Uncontrolled DM2: HgbA1c 10.3, per primary team.   7. Noncompliance: Has not followed up in HF clinic and generally not taking medications.  Does not have insurance.  - TOC consult placed; need SW  assistance  Length of Stay: 8  Beckey LITTIE Coe, NP  03/18/2024, 7:26 AM  Advanced Heart Failure Team Pager [phone] (M-F; 7a - 5p)  Please contact [facility] for night-coverage after hours (5p -7a ) and weekends on [email or web address]  Patient seen with NP, I formulated the plan and agree with the above note.   No labs yet this morning.  CVP 10-11.  He remains in NSR after DCCV yesterday.    Denies dyspnea at rest.   General: NAD Neck: JVP 8-9 cm, no thyromegaly or thyroid nodule.  Lungs: Clear to auscultation bilaterally with normal respiratory effort. CV: Nondisplaced PMI.  Heart regular S1/S2, no S3/S4, no murmur.  No peripheral edema.   Abdomen: Soft, nontender, no hepatosplenomegaly, no distention.  Skin: Intact without lesions or rashes.  Neurologic: Alert and oriented x 3.  Psych: Normal affect. Extremities: No clubbing or  cyanosis.  HEENT: Normal.   1. Acute on chronic systolic CHF: Long history of nonischemic cardiomyopathy. Cath in 6/23 with no significant CAD, CI 1.99.  Substance abuse (cocaine and ETOH) thought to play a role in cardiomyopathy, also uncontrolled HTN in the past.  However, cardiac MRI was done in 6/23 showing LVEF 29%, LV severely dilated, asymmetric LVH with basal septum measuring 17 mm, RVEF 45%, patchy LGE in septum and RV insertion sites + subepicardial LGE in inferolateral wall. Scar pattern and asymmetric hypertrophy could be consistent with hypertrophic cardiomyopathy. However, due to subepicardial LGE in inferolateral wall and presence of mediastinal lymphadenopathy, cardiac PET recommended to rule out cardiac sarcoidosis.  This was never done due to lack of insurance.  Echo this admission showed EF 20-25% with moderate concentric LVH, severe RV dysfunction, probably severe functional MR with PISA ERO 0.47 cm^2, IVC dilated.  Patient was admitted with marked volume overload, NYHA class III symptoms and cardiorenal syndrome with creatinine 3.29 (uncertain of  current baseline).  Low output HF, milrinone  0.25 started and he was diuresed.  GDMT limited by renal dysfunction. This morning, he is on milrinone  0.125, no co-ox yet.  CVP 10-11. BMET pending.  - If co-ox stable today, stop milrinone .   - Increase hydralazine  to 100 mg tid and Imdur  to 90 mg daily.  - Lasix  80 mg IV x 1 this morning, then start torsemide  40 mg bid this evening.  - Continue Jardiance  10 mg daily.  - Not a good candidate for advanced therapies with noncompliance, lack of insurance, RV dysfunction and AKI.  2. Atrial fibrillation/atypical flutter: He was started on warfarin at recent admission in [place]  but has not been having his INR checked so INR was > 10 at admission.  Now back on Xarelto .  He went into persistent atypical flutter this admission, TEE-guided DCCV back to NSR on 7/7, he remains in NSR this morning.   - Continue Xarelto .  - Continue amiodarone  as gtt until we stop milrinone , then transition to po.  - Eventual fibrillation/flutter ablation would be ideal.  3. ?AKI on CKD stage 3: Suspect cardiorenal syndrome.  Last creatinine prior to admission in 12/24 was 1.6.  Creatinine 3.29 at admission, 2.76 yesterday.  - Needs BMET this morning.   4. Mitral regurgitation: Severe functional MR on echo this admission.  TEE 7/7 showed severe eccentric, posteriorly-directed MR with restricted posterior leaflet.  MR ERO 0.6 cm^2 by PISA.  - Will need to follow closely with GDMT titration, if does not improve would favor mTEER but lack of insurance coverage will be an issue.  5. ETOH/cocaine abuse: UDS negative this admission. Says he has quit.  6. Uncontrolled DM2: HgbA1c 10.3, per primary team.  7. Noncompliance: Has not followed up in HF clinic and generally not taking medications.  Does not have insurance.   Hopefully home tomorrow once off milrinone .   Camyah Pultz 03/18/2024 9:15 AM

## 2024-03-18 NOTE — Progress Notes (Addendum)
 HD#8 SUBJECTIVE:  Patient Summary: Justin [name] is a 43 y.o. 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 He developed a-flutter and underwent TEE with cardioversion on 7/8.  Overnight Events: None  Interim History: Justin [name] continues to feel well after his cardioversion yesterday. He has had some coughing but has had no fevers, shortness of breath or chest pain. He was started on oxygen last night because his O2 saturations were low, but was not feeling short of breath.  Cardioversion with the heart failure team yesterday. Marked improvement of symptoms.   Seen by cardiology who recommend stopping milrinone , increasing dose of hydralazine  and Imdur , and starting diuresis.  OBJECTIVE:  Vital Signs: Vitals:   03/18/24 0026 03/18/24 0457 03/18/24 0826 03/18/24 1113  BP: (!) 142/107 (!) 137/107 (!) 135/97 (!) 120/91  Pulse: 85 92 96 (!) 102  Resp: 20 20  20   Temp: 99 F (37.2 C) 98.1 F (36.7 C) 98.3 F (36.8 C) 98.1 F (36.7 C)  TempSrc: Oral Oral Oral Oral  SpO2: 95% 94% 98% 98%  Weight:  103.6 kg    Height:       Supplemental O2: 2L [place], decreased to room air during the visit without any subsequent desaturations.   Filed Weights   03/16/24 0510 03/17/24 0338 03/18/24 0457  Weight: 103.2 kg 104 kg 103.6 kg     Intake/Output Summary (Last 24 hours) at 03/18/2024 1119 Last data filed at 03/18/2024 1113 Gross per 24 hour  Intake 483.07 ml  Output 1450 ml  Net -966.93 ml   Net IO Since Admission: -19,669.85 mL [03/18/24 1119]  Physical Exam: Physical Exam Constitutional:      General: He is not in acute distress.    Appearance: He is not ill-appearing.  Cardiovascular:     Rate and Rhythm: Normal rate and regular rhythm.   Pulmonary:     Effort: Pulmonary effort is normal.     Breath sounds: Normal breath sounds.  Musculoskeletal:     Right lower leg: No edema.     Left lower leg: No edema.  Neurological:     Mental Status: He is alert.      Patient Lines/Drains/Airways Status     Active Line/Drains/Airways     Name Placement date Placement time Site Days   PICC Double Lumen 03/11/24 Right Basilic 41 cm 0 cm 03/11/24  8356  -- 7            Pertinent labs and imaging:      Latest Ref Rng & Units 03/16/2024    5:45 AM 03/15/2024    5:25 AM 03/14/2024    5:50 AM  CBC  WBC 4.0 - 10.5 K/uL 7.2  6.3  7.1   Hemoglobin 13.0 - 17.0 g/dL 83.9  83.8  83.9   Hematocrit 39.0 - 52.0 % 49.2  49.8  51.0   Platelets 150 - 400 K/uL 195  184  214        Latest Ref Rng & Units 03/18/2024    8:27 AM 03/17/2024    4:54 AM 03/16/2024    5:45 AM  CMP  Glucose 70 - 99 mg/dL 778  873  875   BUN 6 - 20 mg/dL 50  49  53   Creatinine 0.61 - 1.24 mg/dL 7.16  2.76  2.75   Sodium 135 - 145 mmol/L 139  141  138   Potassium 3.5 - 5.1 mmol/L 4.0  3.9  3.7   Chloride 98 - 111 mmol/L 103  104  103   CO2 22 - 32 mmol/L 25  26  25    Calcium  8.9 - 10.3 mg/dL 9.4  9.3  9.0     No results found.   ASSESSMENT/PLAN:  Assessment: Principal Problem:   Acute hypoxic respiratory failure (HCC) Active Problems:   Essential hypertension   Acute on chronic systolic (congestive) heart failure (HCC)   Type 2 diabetes mellitus with hyperlipidemia (HCC)   AKI (acute kidney injury) (HCC)   Paroxysmal atrial fibrillation with RVR (HCC)   Plan: #Acute hypoxic respiratory failure due to combined systolic and diastolic heart failure exacerbation, low output state   Presented with shortness of breath and bilateral lower extremity swelling. Echo on 7/1 showed worsening right ventricular systolic function and worsening mitral insufficiency. Heart failure team is onboard. Likely with low-output state reflected by low co-oximetry on 7/1.  He was started on Milrinone  at that time. After cardioversion yesterday and maintenance of SR, he has been less tachycardic. His Coox today is 63.7, stable from yesterday. He has multiple social barriers including poor social support and lack of insurance. He has been euvolemic on exam for several days without edema or pulmonary crackles. His diuresis was restarted on 7/8. CVP today 10-11.   Plan: -daily weights -Jardiance  10 mg, holding BB and further GDMT -atorvastatin  20mg  -1 time dose IV lasix  80mg  IV this morning -Torsemide  40mg  BID, starting this evening -Stop Milrinone  today per cardiology -Hydralazine  100mg  TID and Imdur  90mg  daily -heart failure team on board, appreciate recs. -ambulate with pulse ox today   #Paroxysmal A-fib #2:1 Atrial flutter #Tachycardia  Recently started warfarin instead of Xarelto , had previously been taking Xarelto  due to daily dosing and no repeated checks. INR greater than 10 on admission, INR after vitamin K  2.8. Given Vitamin K  for anticoagulation reversal x2.  INR up to 4.3 on 7/3. 3.1 on 7/4. CHA2DS2-VASc of 3. Underwent cardioversion with heart failure team with conversion to sinus rhythm.     Plan -Renally dosed Xarelto  to 15mg  given CrClearance -amiodarone  30 mg/hr IV, change to PO after milrinone  is weaned    #AKI on CKD stage IIIa -Creatinine increased to 3.29 on admission from baseline of 1.6 in December 2024 -renal U/S showed echogenic kidneys compatible with chronic medical renal disease, new or progressed since 2023. No hydronephrosis or acute renal finding. -likely cardiorenal AKI  -Creatinine stable from yesterday at 2.83   Plan -strict ins and outs -IV Lasix  80mg  this AM -Torsemide  40mg  PO BID, starting tonight    #Hypokalemia (resolved) K was 4 today -Daily BMP   #T2DM -Started on glimeperide 2mg  prior to admission -A1c 10.3 this admission -received 5 units of correctional insulin  yesterday -may need to adjust medication  prior to discharge   Plan: -Moderate SSI -Jardiance  10mg    #HTN -BP today 137/107 -holding BB for now with acute exacerbation, waiting for additional GDMT given kidney function, continue diuresis -Increase dosage of Hydralazine  to 100mg  TID and Imdur  to 90mg  once daily    #Anxiety #Insomnia -Melatonin 3mg  at bed time -PRN Hydroxyzine  25mg  TID for anxiety  Best Practice: Diet: Diabetic diet VTE: Xarelto  15mg  Code: Full  Disposition planning: Therapy Recs: None,  DISPO: Anticipated discharge in 1-2 days to Home pending weaning of Milrinone .  Signature:  Materials engineer  [facility]  11:19 AM, 03/18/2024  On Call pager [phone]

## 2024-03-18 NOTE — Progress Notes (Signed)
 Co-ox 64%. Will stop milrinone  and amiodarone  gtt. Transition amio to PO as seen in progress note from today.   Beckey Coe, AGACNP-BC  Advanced Heart Failure Team  03/18/24

## 2024-03-19 ENCOUNTER — Other Ambulatory Visit (HOSPITAL_COMMUNITY): Payer: Self-pay

## 2024-03-19 ENCOUNTER — Other Ambulatory Visit (HOSPITAL_BASED_OUTPATIENT_CLINIC_OR_DEPARTMENT_OTHER): Payer: Self-pay

## 2024-03-19 LAB — GLUCOSE, CAPILLARY
Glucose-Capillary: 96 mg/dL (ref 70–99)
Glucose-Capillary: 182 mg/dL — ABNORMAL HIGH (ref 70–99)
Glucose-Capillary: 130 mg/dL — ABNORMAL HIGH (ref 70–99)

## 2024-03-19 LAB — COOXEMETRY PANEL
Carboxyhemoglobin: 1.8 % — ABNORMAL HIGH (ref 0.5–1.5)
Methemoglobin: <0.7 % (ref 0.0–1.5)
O2 Saturation: 60.3 %
Total hemoglobin: 16.4 g/dL — ABNORMAL HIGH (ref 12.0–16.0)

## 2024-03-19 LAB — BASIC METABOLIC PANEL WITH GFR
Anion gap: 13 (ref 5–15)
BUN: 39 mg/dL — ABNORMAL HIGH (ref 6–20)
CO2: 27 mmol/L (ref 22–32)
Calcium: 9.5 mg/dL (ref 8.9–10.3)
Chloride: 99 mmol/L (ref 98–111)
Creatinine, Ser: 2.69 mg/dL — ABNORMAL HIGH (ref 0.61–1.24)
GFR, Estimated: 29 mL/min — ABNORMAL LOW (ref >60)
Glucose, Bld: 156 mg/dL — ABNORMAL HIGH (ref 70–99)
Potassium: 3.6 mmol/L (ref 3.5–5.1)
Sodium: 139 mmol/L (ref 135–145)

## 2024-03-19 MED ORDER — RIVAROXABAN 15 MG PO TABS
15.0000 mg | ORAL_TABLET | Freq: Every day | ORAL | 1 refills | Status: DC
  Filled 2024-03-19 (×2): qty 42, 30d supply, fill #0

## 2024-03-19 MED ORDER — ISOSORBIDE MONONITRATE ER 30 MG PO TB24
90.0000 mg | ORAL_TABLET | Freq: Every day | ORAL | 0 refills | Status: DC
  Filled 2024-03-19: qty 90, 30d supply, fill #0

## 2024-03-19 MED ORDER — AMIODARONE HCL 200 MG PO TABS
ORAL_TABLET | ORAL | 0 refills | Status: DC
  Filled 2024-03-19: qty 68, 30d supply, fill #0

## 2024-03-19 MED ORDER — RIVAROXABAN 20 MG PO TABS: 20.0000 mg | ORAL_TABLET | Freq: Every day | ORAL | Status: DC

## 2024-03-19 MED ORDER — TORSEMIDE 20 MG PO TABS: 80.0000 mg | ORAL_TABLET | Freq: Every day | ORAL | Status: DC

## 2024-03-19 MED ORDER — HYDRALAZINE HCL 100 MG PO TABS
100.0000 mg | ORAL_TABLET | Freq: Three times a day (TID) | ORAL | 0 refills | Status: DC
  Filled 2024-03-19: qty 90, 30d supply, fill #0

## 2024-03-19 MED ORDER — TORSEMIDE 20 MG PO TABS
ORAL_TABLET | ORAL | 0 refills | Status: DC
  Filled 2024-03-19: qty 180, 30d supply, fill #0

## 2024-03-19 MED ORDER — EMPAGLIFLOZIN 10 MG PO TABS
10.0000 mg | ORAL_TABLET | Freq: Every day | ORAL | 0 refills | Status: DC
  Filled 2024-03-19: qty 30, 30d supply, fill #0

## 2024-03-19 MED ORDER — POTASSIUM CHLORIDE CRYS ER 20 MEQ PO TBCR
20.0000 meq | EXTENDED_RELEASE_TABLET | Freq: Every day | ORAL | 0 refills | Status: DC
  Filled 2024-03-19: qty 30, 30d supply, fill #0

## 2024-03-19 MED ORDER — TORSEMIDE 20 MG PO TABS: 40.0000 mg | ORAL_TABLET | Freq: Every evening | ORAL | Status: DC

## 2024-03-19 NOTE — Discharge Summary (Addendum)
 Name: Justin [name] MRN: [id number] DOB: [birth year] 43 y.o. PCP: Celestia Rosaline SQUIBB, NP  Date of Admission: 03/09/2024  6:06 PM Date of Discharge: 03/19/2024 Attending Physician: Dr. Ronnald Sergeant  Discharge Diagnosis: 1. Principal Problem:   Acute hypoxic respiratory failure (HCC) Active Problems:   Essential hypertension   Acute on chronic systolic (congestive) heart failure (HCC)   Type 2 diabetes mellitus with hyperlipidemia (HCC)   AKI (acute kidney injury) (HCC)   Paroxysmal atrial fibrillation with RVR (HCC)   Discharge Medications: Allergies as of 03/19/2024   No Known Allergies      Medication List     STOP taking these medications    bumetanide 1 MG tablet Commonly known as: BUMEX   metoprolol  succinate 50 MG 24 hr tablet Commonly known as: TOPROL -XL   midodrine 5 MG tablet Commonly known as: PROAMATINE   warfarin 5 MG tablet Commonly known as: COUMADIN       TAKE these medications    amiodarone  200 MG tablet Commonly known as: PACERONE  Tome 2 tabletas (400 mg en total) por va oral 2 (dos) veces al da durante 6 das, LUEGO 1 tableta (200 mg en total) 2 (dos) veces al da durante 7 das, LUEGO 1 tableta (200 mg en total) al da. (Take 2 tablets (400 mg total) by mouth 2 (two) times daily for 6 days, THEN 1 tablet (200 mg total) 2 (two) times daily for 7 days, THEN 1 tablet (200 mg total) daily.) Start taking on: March 19, 2024 What changed:  medication strength See the new instructions.   atorvastatin  20 MG tablet Commonly known as: LIPITOR Tome 1 tableta (20 mg en total) por va oral diariamente. (Take 1 tablet (20 mg total) by mouth daily.) What changed: when to take this   empagliflozin  10 MG Tabs tablet Commonly known as: JARDIANCE  Tome 1 tableta (10 mg en total) por va oral diariamente. (Take 1 tablet (10 mg total) by mouth daily.)   glimepiride  2 MG tablet Commonly known as: AMARYL  Take 2 mg by mouth daily.    hydrALAZINE  100 MG tablet Commonly known as: APRESOLINE  Take 1 tablet (100 mg total) by mouth with breakfast, with lunch, and with evening meal.   isosorbide  mononitrate 30 MG 24 hr tablet Commonly known as: IMDUR  Take 3 tablets (90 mg total) by mouth daily.   potassium chloride  SA 20 MEQ tablet Commonly known as: KLOR-CON  M Tome una tableta (20 mEq en total) por va oral diariamente. (Take 1 tablet (20 mEq total) by mouth daily.) Start taking on: March 20, 2024   rivaroxaban  20 MG Tabs tablet Commonly known as: XARELTO  Take 1 tablet (20 mg total) by mouth daily with supper.   torsemide  20 MG tablet Commonly known as: DEMADEX  Tome 4 comprimidos por la maana (80 mg en total) y 2 comprimidos (40 mg en total) por la noche. (Take 4 tablets in the morning (80mg  total) and 2 tablets (40mg  total) in the evening.)        Disposition and follow-up:   Justin [name] was discharged from [facility] in good condition.  At the hospital follow up visit please address:  #Acute hypoxic respiratory failure due to combined systolic and diastolic heart failure exacerbation, low output state Low-output state requiring milrinone  while admitted. Discharged on torsemide  80mg  qAM and 40mg  qPM, Jardiance , and hydral/isosorbide  mononitrate. Please address volume status and medication adherence. Patient discharged with many new medications. Please obtain BMP. Consider further GDMT pending  kidney function.  #Paroxysmal A-fib #2:1 Atrial flutter S/p cardioversion during admission. Patient started on Amiodarone  taper 400mg  BID until 7/14, then 200mg  BID for one week until 7/22, then 200mg  once daily on 7/22 as maintenance dose. Started on Xarelto  20mg . He has manufacturer assistance for the Xarelto . Please assess how patient is doing with his amiodarone  taper. Consider EKG.  #AKI on CKD stage IIIa His creatinine increased to 3.29 on admission from baseline of 1.6 in  December 2024. Likely a cardiorenal AKI. Creatinine on discharge 2.69. Discharged on Torsemide  80mg  qAM/ 40mg  qPM. Please check BMP.   #DMII A1C elevated to 10.3 on admission. Home med Glimeperide 2mg . Was also started on Jardiance  for heart failure. May consider GLP1-RA, patient would likely need financial assistance.  #Potential sleep apnea STOP-BANG greater than 3. Desaturations while asleep to 85% but not during the day while he is awake and lying down or with ambulation. Please consider a sleep study for this patient.   Labs / imaging needed at time of follow-up: BMP, Sleep study, EKG  Pending labs/ test needing follow-up: None  Follow-up Appointments:  Follow-up Information     [facility] Follow up on 03/24/2024.   Specialty: Cardiology Why: at 3:40pm Advanced [facility], Entrance C Free Valet Parking Available Contact information: [address] [place]  [address] [phone]        Celestia Rosaline SQUIBB, NP. Go in 14 day(s).   Specialty: Internal Medicine Why: Hospital follow up appointment scheduled for Thursday, March 27, 2024 at 3:30 PM.  PLEASE ARRIVE 10-15 minutes early.  PLEASE call to cancel/reschedule if you CANNOT make appointment. Contact information: [address] [place] [address] [phone]                  Hospital Course by problem list: Justin [name] is a 43 y.o. with a pertinent PMH of biventricular HFrEF (20 to 25%), paroxysmal A-fib on anticoagulation, T2DM, HTN who presents with dyspnea and lower extremity edema and admitted for acute HFrEF exacerbation. His lower extremity edema and dyspnea have resolved. He developed a-flutter and underwent TEE with cardioversion on 7/8, with significant improvement of his symptoms.   #Acute hypoxic respiratory failure due to combined systolic and diastolic heart failure exacerbation,  low output state  He presented with shortness of breath and bilateral lower extremity swelling, consistent with heart failure exacerbation in the setting of unclear home medication use. He was admitted with a weight of 111 kgs which was decreased to 102.1 kgs on the day of discharge after administration of diuretics for several days. Cardiology was consulted. His oxygen requirements decreased rapidly with diuresis. New echo on 7/1 showed an EF of 20-25%, worsening right ventricular systolic function and worsening mitral insufficiency. He had cool extremities and a co-oximetry of 43.9 on 7/1 and was started on Milrinone  for low output heart failure. He underwent cardioversion for a-flutter on 7/8 and was able to wean from Milrinone  over the next couple of days while co-oximetry remained stable between 60 and 65%. He was euvolemic for several days before discharge. He was started on Torsemide  80mg  qAM/ 40mg  qPM for a maintenance dose with cardiology. Also on Jardiance  and hydral/isosorbide . Further GDMT limited due to kidney function and low-output HF. At discharge, he was not requiring any oxygen with ambulation and was able to maintain saturations at 95%   #Paroxysmal A-fib #2:1 Atrial flutter #Tachycardia  Recently started warfarin before  admission while living in [place] , but had previously been taking Xarelto  due to daily dosing and no repeated checks. INR greater than 10 on admission, Given Vitamin K  for anticoagulation reversal x2.  CHA2DS2-VASc of 3. New a-flutter seen on telemetry on 7/3. Underwent cardioversion with heart failure team with conversion to sinus rhythm on 7/7 with improvement of his tachycardia. He was restarted on Xarelto  20mg  with a one year manufacturer assistance program. He was started on an amiodarone  drip and this was changed to PO on 7/8.   #AKI on CKD stage IIIa His creatinine increased to 3.29 on admission from baseline of 1.6 in December 2024. Renal U/S showed echogenic  kidneys compatible with chronic medical renal disease, new or progressed since 2023. No hydronephrosis or acute renal finding. Likely a cardiorenal AKI as it improved with diuresis to 2.47 on 7/2. At that time his creatinine started to increase and his diuretics were stopped. After kidney function improved diuresis restarted on 7/6. Creatinine on discharge 2.69. Discharged on Torsemide  80mg  qAM/ 40mg  qPM   #T2DM Was on glimeperide 2mg  prior to admission. A1c 10.3 this admission. Moderate sliding scale insulin  used during admission. Will restart Glimeperide 2mg  on discharge   #HTN BP remained relatively well controlled since admission. BB were held in acute exacerbation/low-output HF and his anti-hypertensives were titrated to hydralazine  to 100mg  TID and Imdur  to 90mg  once daily    #Anxiety #Insomnia -Melatonin 3mg  at bed time -PRN Hydroxyzine  25mg  TID for anxiety   Discharge Subjective Justin [name] is feeling well today and is not short of breath. He still has a mild cough. He denies any history of sleep apnea and has never worn a CPAP. Patient says that he does have oxygen at home. Patient reports that he will be staying here in [place] and not going back to [place] . Was able to ambulate without O2 use and maintained his saturations.  Discharge Exam:   BP (!) 129/94 (BP Location: Left Arm)   Pulse 93   Temp 97.8 F (36.6 C) (Oral)   Resp 20   Ht 5' 8 (1.727 m)   Wt 102.1 kg   SpO2 95%   BMI 34.21 kg/m  Discharge exam:  Constitutional:      General: He is not in acute distress.    Appearance: He is not ill-appearing.  Cardiovascular:     Rate and Rhythm: Normal rate and regular rhythm.  Pulmonary:     Effort: Pulmonary effort is normal.     Breath sounds: Normal breath sounds.  Musculoskeletal:     Right lower leg: No edema.     Left lower leg: No edema.  Neurological:     Mental Status: He is alert.   Pertinent Labs, Studies, and Procedures:      Latest Ref Rng & Units 03/16/2024    5:45 AM 03/15/2024    5:25 AM 03/14/2024    5:50 AM  CBC  WBC 4.0 - 10.5 K/uL 7.2  6.3  7.1   Hemoglobin 13.0 - 17.0 g/dL 83.9  83.8  83.9   Hematocrit 39.0 - 52.0 % 49.2  49.8  51.0   Platelets 150 - 400 K/uL 195  184  214        Latest Ref Rng & Units 03/19/2024    8:37 AM 03/18/2024    8:27 AM 03/17/2024    4:54 AM  CMP  Glucose 70 - 99 mg/dL 843  778  873   BUN 6 - 20 mg/dL  39  50  49   Creatinine 0.61 - 1.24 mg/dL 7.30  7.16  7.23   Sodium 135 - 145 mmol/L 139  139  141   Potassium 3.5 - 5.1 mmol/L 3.6  4.0  3.9   Chloride 98 - 111 mmol/L 99  103  104   CO2 22 - 32 mmol/L 27  25  26    Calcium  8.9 - 10.3 mg/dL 9.5  9.4  9.3     US  RENAL Result Date: 03/10/2024 CLINICAL DATA:  43 year old male with acute renal insufficiency. EXAM: RENAL / URINARY TRACT ULTRASOUND COMPLETE COMPARISON:  Noncontrast chest CT 02/17/2022. Right upper quadrant ultrasound 02/09/2022. FINDINGS: Right Kidney: Renal measurements: 11.7 x 6.0 x 6.1 cm = volume: 222 mL. Abnormally increased renal cortical echogenicity since the previous ultrasound (image 3). No right hydronephrosis or renal mass. Left Kidney: Renal measurements: 12.8 x 6.2 x 6.5 cm = volume: 268 mL. Similar abnormal increased left renal cortical echogenicity (image 28). No left hydronephrosis or renal mass. Bladder: Appears normal for degree of bladder distention. Other: None. IMPRESSION: Echogenic kidneys compatible with chronic medical renal disease, new or progressed since 2023. No hydronephrosis or acute renal finding. Electronically Signed   By: VEAR Hurst M.D.   On: 03/10/2024 04:10   DG Chest 2 View Result Date: 03/09/2024 CLINICAL DATA:  cp EXAM: CHEST - 2 VIEW COMPARISON:  August 24, 2023 FINDINGS: Bilateral perihilar interstitial opacities with patchy airspace opacities in the both lung bases. No pleural effusion or pneumothorax. Severe cardiomegaly.No acute fracture or destructive lesion. Multilevel thoracic  osteophytosis. IMPRESSION: Cardiomegaly with findings of pulmonary edema. Electronically Signed   By: Rogelia Myers M.D.   On: 03/09/2024 19:26     Discharge Instructions: [name] por permitirnos ser parte de su atencin. Estuvo hospitalizado por insuficiencia cardaca. Le tratamos con pastillas de lquidos, cardioversin y medicamentos para fortalecer su corazn.  Por favor, revise los cambios en sus medicamentos mencionados   Por favor, tome Amiodarona 200 mg, dos pastillas dos veces al da hasta el 14/7, luego 200 mg, una pastilla dos veces al da durante una semana hasta el 22/7, luego 200 mg una vez al da el 22/7 hasta que vea a un cardilogo.  CITA DE SEGUIMIENTO: Coordinamos una cita de seguimiento con su mdico de cabecera el 17 de julio a las 15:30 h. Visite Cardiologa el 14 de julio a las 15:40 h.  Nos alegra que se sienta mejor.  Emran Molzahn [facility] [place]  Signed: Napoleon Limes, MD 03/19/2024, 3:08 PM

## 2024-03-19 NOTE — Progress Notes (Addendum)
 CVP 9  Ok for d/c home from HF standpoint   Cardiac Meds for discharge  Amiodarone  400 mg bid x 1 wk>>200 mg bid x 1 wk>>200 mg daily  Hydralazine  100 mg tid Imdur  90 mg daily  Jardiance  10 mg daily  Torsemide  80 mg qam/ 40 mg qpm KCl 20 mEq daily  Xarelto  20 mg daily  Atorvastatin  20 mg daily   Hospital f/u has been arranged in the [facility]. Appt info in AVS  [place], PA-C 03/19/2024

## 2024-03-19 NOTE — Progress Notes (Signed)
 RA DL PICC removed per protocol per MD order. Manual pressure applied for 3 mins. Vaseline gauze, gauze, and Tegaderm applied over insertion site. No bleeding or swelling noted. Instructions given with hospital online interpreter services: Instructed patient to remain in bed for thirty mins. Educated patient about S/S of infection and when to call MD; no heavy lifting or pressure on right side for 24 hours; keep dressing dry and intact for 24 hours. Pt verbalized comprehension and no further questions.

## 2024-03-19 NOTE — Plan of Care (Signed)
 Pt vital signs and assessment remain unremarkable

## 2024-03-19 NOTE — Progress Notes (Addendum)
 HD#9 SUBJECTIVE:  Patient Summary: Justin [name] is a 43 y.o. with a pertinent PMH of biventricular HFrEF (20 to 25%), paroxysmal A-fib on anticoagulation, T2DM, HTN who presents with dyspnea and lower extremity edema and admitted for acute HFrEF exacerbation. His lower extremity edema and dyspnea have resolved. He developed a-flutter and underwent TEE with cardioversion on 7/8, with significant improvement of his symptoms.   Overnight Events: None  Interim History:  Justin [name] is feeling well today and is not short of breath. He stills has a mild cough. He denies any history of Sleep apnea and has never worn a CPAP. Patient says that he does have oxygen at home. Patient reports that he will be staying here in [place] and not going back to [place]    Seen by cardiology this morning and they believe that he could go home today if his CVP and his creatinine are stable.  OBJECTIVE:  Vital Signs: Vitals:   03/18/24 1555 03/18/24 2000 03/19/24 0038 03/19/24 0534  BP: 126/86 (!) 131/93 (!) 138/94 (!) 133/104  Pulse: 91 96 89 89  Resp: 20 20 20 20   Temp: 97.9 F (36.6 C) 97.9 F (36.6 C) 98.1 F (36.7 C) 98.2 F (36.8 C)  TempSrc: Oral Oral Oral Oral  SpO2: 99% 100% 96% 94%  Weight:    102.1 kg  Height:       Supplemental O2: Room Air  Filed Weights   03/17/24 0338 03/18/24 0457 03/19/24 0534  Weight: 104 kg 103.6 kg 102.1 kg     Intake/Output Summary (Last 24 hours) at 03/19/2024 0750 Last data filed at 03/19/2024 9462 Gross per 24 hour  Intake 414 ml  Output 2375 ml  Net -1961 ml   Net IO Since Admission: -21,255.85 mL [03/19/24 0750]  Physical Exam: Physical Exam Constitutional:      General: He is not in acute distress.    Appearance: He is not ill-appearing.  Cardiovascular:     Rate and Rhythm: Normal rate and regular rhythm.  Pulmonary:     Effort: Pulmonary effort is normal.     Breath sounds: Normal breath sounds.   Musculoskeletal:     Right lower leg: No edema.     Left lower leg: No edema.  Neurological:     Mental Status: He is alert.      Patient Lines/Drains/Airways Status     Active Line/Drains/Airways     Name Placement date Placement time Site Days   PICC Double Lumen 03/11/24 Right Basilic 41 cm 0 cm 03/11/24  8356  -- 8            Pertinent labs and imaging:   Latest Reference Range & Units 03/19/24 03:57  Total hemoglobin 12.0 - 16.0 g/dL 83.5 (H)  Carboxyhemoglobin 0.5 - 1.5 % 1.8 (H)  Methemoglobin 0.0 - 1.5 % <0.7  O2 Saturation % 60.3  (H): Data is abnormally high    Latest Ref Rng & Units 03/16/2024    5:45 AM 03/15/2024    5:25 AM 03/14/2024    5:50 AM  CBC  WBC 4.0 - 10.5 K/uL 7.2  6.3  7.1   Hemoglobin 13.0 - 17.0 g/dL 83.9  83.8  83.9   Hematocrit 39.0 - 52.0 % 49.2  49.8  51.0   Platelets 150 - 400 K/uL 195  184  214        Latest Ref Rng & Units 03/19/2024    8:37 AM 03/18/2024    8:27  AM 03/17/2024    4:54 AM  CMP  Glucose 70 - 99 mg/dL 843  778  873   BUN 6 - 20 mg/dL 39  50  49   Creatinine 0.61 - 1.24 mg/dL 7.30  7.16  7.23   Sodium 135 - 145 mmol/L 139  139  141   Potassium 3.5 - 5.1 mmol/L 3.6  4.0  3.9   Chloride 98 - 111 mmol/L 99  103  104   CO2 22 - 32 mmol/L 27  25  26    Calcium  8.9 - 10.3 mg/dL 9.5  9.4  9.3     No results found.  ASSESSMENT/PLAN:  Assessment: Principal Problem:   Acute hypoxic respiratory failure (HCC) Active Problems:   Essential hypertension   Acute on chronic systolic (congestive) heart failure (HCC)   Type 2 diabetes mellitus with hyperlipidemia (HCC)   AKI (acute kidney injury) (HCC)   Paroxysmal atrial fibrillation with RVR (HCC)  Justin [name] is a 43 y.o. with a pertinent PMH of biventricular HFrEF (20 to 25%), paroxysmal A-fib on anticoagulation, T2DM, HTN who presents with dyspnea and lower extremity edema and admitted for acute HFrEF exacerbation.His lower extremity edema and dyspnea have  resolved. He developed a-flutter and underwent TEE with cardioversion on 7/8. His tachycardia resolved after cardioversion.  Plan: #Acute hypoxic respiratory failure due to combined systolic and diastolic heart failure exacerbation, low output state   Presented with shortness of breath and bilateral lower extremity swelling. Echo on 7/1 showed worsening right ventricular systolic function and worsening mitral insufficiency. Heart failure team is onboard. Likely with low-output state reflected by low co-oximetry on 7/1. He was started on Milrinone  at that time. After cardioversion on 7/9 and maintenance of SR, he has been less tachycardic. His Coox today is 60.3, stable from yesterday after weaning Milrinone . He has multiple social barriers including poor social support and lack of insurance. He has been euvolemic on exam for several days without edema or pulmonary crackles. His diuresis was restarted on 7/8.    Plan: -daily weights -Jardiance  10 mg, holding BB and further GDMT given low output and kidney dysfunction -atorvastatin  20mg  -Torsemide  80 qam/ 40qpm per cardiology -Hydralazine  100mg  TID and Imdur  90mg  daily -heart failure team on board, appreciate recs.   #Paroxysmal A-fib #2:1 Atrial flutter #Tachycardia  Recently started warfarin before admission, had previously been taking Xarelto  due to daily dosing and no repeated checks. INR greater than 10 on admission, INR after vitamin K  2.8. Given Vitamin K  for anticoagulation reversal x2.  INR up to 4.3 on 7/3. 3.1 on 7/4. CHA2DS2-VASc of 3. Underwent cardioversion with heart failure team with conversion to sinus rhythm.     Plan -Renally dosed Xarelto  to 15mg  given CrClearance, patient has manufacturer assistance for this. -amiodarone  400mg  BID for one week until 7/14, then 200mg  BID for one week until 7/22, then 200mg  once daily on 7/22 as maintenance dose    #AKI on CKD stage IIIa -Creatinine increased to 3.29 on admission from  baseline of 1.6 in December 2024 -renal U/S showed echogenic kidneys compatible with chronic medical renal disease, new or progressed since 2023. No hydronephrosis or acute renal finding. -likely cardiorenal AKI  -Creatinine today 2.69   Plan -strict ins and outs -Torsemide  80 qam/ 40qpm per cardiology     #Hypokalemia (resolved) K was 3.6 today -per cardiology will be discharged on per day   #T2DM -Started on glimeperide 2mg  prior to admission -A1c 10.3 this admission -  received 2 units of correctional insulin  yesterday   Plan: -Moderate SSI -Jardiance  10mg  -Plan for restarting glimeperide 2mg  on discharge   #HTN -BP today 133/104 -holding BB for now with acute exacerbation, waiting for additional GDMT given kidney function, continue diuresis -Hydralazine  to 100mg  TID and Imdur  to 90mg  once daily    #Anxiety #Insomnia -Melatonin 3mg  at bed time -PRN Hydroxyzine  25mg  TID for anxiety  Best Practice: Diet: Cardiac diet VTE: Xarelto  15mg  Code: Full  Disposition planning: Therapy Recs: None  DISPO: Anticipated discharge today to Home  Signature:  Konnor Vondrasek [facility]  7:50 AM, 03/19/2024  On Call pager [phone]

## 2024-03-19 NOTE — Progress Notes (Addendum)
 Advanced Heart Failure Rounding Note  Cardiologist: Shelda Bruckner, MD  HF MD: Dr. Rolan Chief Complaint: A/C HFrEF Subjective:    Milrinone  stopped yesterday. Co-ox ok today at 60%. BMP pending.   2.4L in UOP yesterday. Wt continues to trend down. CVP disconnected   Feels better today. Breathing improved. Denies resting dyspnea. No current complaints.    Objective:    Weight Range: 102.1 kg Body mass index is 34.21 kg/m.   Vital Signs:   Temp:  [97.8 F (36.6 C)-98.2 F (36.8 C)] 97.8 F (36.6 C) (07/09 0820) Pulse Rate:  [89-102] 93 (07/09 0820) Resp:  [20] 20 (07/09 0820) BP: (120-138)/(86-104) 129/94 (07/09 0820) SpO2:  [94 %-100 %] 95 % (07/09 0820) Weight:  [102.1 kg] 102.1 kg (07/09 0534) Last BM Date : 03/17/24  Weight change: Filed Weights   03/17/24 0338 03/18/24 0457 03/19/24 0534  Weight: 104 kg 103.6 kg 102.1 kg   Intake/Output:  Intake/Output Summary (Last 24 hours) at 03/19/2024 0834 Last data filed at 03/19/2024 0700 Gross per 24 hour  Intake 534 ml  Output 2175 ml  Net -1641 ml    Physical Exam   General:  Well appearing,obese. No respiratory difficulty HEENT: normal Neck: supple. JVD 8-9 cm. Carotids 2+ bilat; no bruits. No lymphadenopathy or thyromegaly appreciated. Cor: PMI nondisplaced. Regular rate & rhythm. No rubs, gallops or murmurs. Lungs: clear Abdomen: soft, nontender, nondistended. No hepatosplenomegaly. No bruits or masses. Good bowel sounds. Extremities: no cyanosis, clubbing, rash, edema + RUE PICC  Neuro: alert & oriented x 3, cranial nerves grossly intact. moves all 4 extremities w/o difficulty. Affect pleasant.   Telemetry   NSR 90s (personally reviewed)  EKG    No new EKG to review  Labs    CBC No results for input(s): WBC, NEUTROABS, HGB, HCT, MCV, PLT in the last 72 hours.  Basic Metabolic Panel Recent Labs    92/92/74 0454 03/18/24 0827  NA 141 139  K 3.9 4.0  CL 104 103  CO2 26  25  GLUCOSE 126* 221*  BUN 49* 50*  CREATININE 2.76* 2.83*  CALCIUM  9.3 9.4  MG 2.2 2.1   BNP (last 3 results) Recent Labs    08/22/23 0722 03/09/24 1845  BNP 951.5* 2,578.5*   Medications:    Scheduled Medications:  acetaminophen   1,000 mg Oral TID   amiodarone   400 mg Oral BID   Followed by   NOREEN ON 03/25/2024] amiodarone   200 mg Oral BID   Followed by   NOREEN ON 04/01/2024] amiodarone   200 mg Oral Daily   atorvastatin   20 mg Oral Daily   Chlorhexidine  Gluconate Cloth  6 each Topical Daily   empagliflozin   10 mg Oral Daily   hydrALAZINE   100 mg Oral TID with meals   insulin  aspart  0-15 Units Subcutaneous TID WC   isosorbide  mononitrate  90 mg Oral Daily   melatonin  3 mg Oral QHS   potassium chloride   40 mEq Oral Daily   rivaroxaban   15 mg Oral Q supper   sodium chloride  flush  10-40 mL Intracatheter Q12H   torsemide   40 mg Oral BID   Infusions:   PRN Medications: HYDROmorphone , hydrOXYzine , mouth rinse, saline, sodium chloride  flush   Patient Profile   Justin [name] is a 43 y.o. Spanish-speaking male with history of chronic systolic CHF, hx uncontrolled HTN, DM II, mitral regurgitation, ETOH abuse, hx crack/cocaine use. AHF team to see for A/C combined systolic and diastolic  heart failure   Assessment/Plan   1. Acute on chronic systolic CHF: Long history of nonischemic cardiomyopathy. Cath in 6/23 with no significant CAD, CI 1.99.  Substance abuse (cocaine and ETOH) thought to play a role in cardiomyopathy, also uncontrolled HTN in the past.  However, cardiac MRI was done in 6/23 showing LVEF 29%, LV severely dilated, asymmetric LVH with basal septum measuring 17 mm, RVEF 45%, patchy LGE in septum and RV insertion sites + subepicardial LGE in inferolateral wall. Scar pattern and asymmetric hypertrophy could be consistent with hypertrophic cardiomyopathy. However, due to subepicardial LGE in inferolateral wall and presence of mediastinal  lymphadenopathy, cardiac PET recommended to rule out cardiac sarcoidosis.  This was never done due to lack of insurance.  Echo this admission showed EF 20-25% with moderate concentric LVH, severe RV dysfunction, probably severe functional MR with PISA ERO 0.47 cm^2, IVC dilated.  Patient was admitted with marked volume overload, NYHA class III symptoms and cardiorenal syndrome with creatinine 3.29 (uncertain of current baseline).  Low output HF, milrinone  0.25 started and he was diuresed.  GDMT limited by renal dysfunction. He has been weaned off milrinone . Co-ox stable today at 60%. BMP pending. Now on PO torsemide , c/w mild volume overload on exam  - Continue hydralazine  100 mg tid and Imdur  90 mg daily.  - Increase torsemide  to 80 mg qam/40 qpm - Continue Jardiance  10 mg daily.  - Not a good candidate for advanced therapies with noncompliance, lack of insurance, RV dysfunction and AKI.  2. Atrial fibrillation/atypical flutter: He was started on warfarin at recent admission in [place]  but has not been having his INR checked so INR was > 10 at admission.  Now back on Xarelto .  He went into persistent atypical flutter this admission, TEE-guided DCCV back to NSR on 7/7, he remains in NSR this morning.   - Continue Xarelto .  - Continue PO amio taper  - Eventual fibrillation/flutter ablation would be ideal.  3. ?AKI on CKD stage 3: Suspect cardiorenal syndrome.  Last creatinine prior to admission in 12/24 was 1.6.  Creatinine 3.29 at admission, 2.83 yesterday. Repeat BMP for today pending   4. Mitral regurgitation: Severe functional MR on echo this admission.  TEE 7/7 showed severe eccentric, posteriorly-directed MR with restricted posterior leaflet.  MR ERO 0.6 cm^2 by PISA.  - Will need to follow closely with GDMT titration, if does not improve would favor mTEER but lack of insurance coverage will be an issue.  5. ETOH/cocaine abuse: UDS negative this admission. Says he has quit.  6. Uncontrolled DM2:  HgbA1c 10.3, per primary team.  7. Noncompliance: Has not followed up in HF clinic and generally not taking medications.  Does not have insurance.   Possible d/c home today pending CVP and BMP.   Length of Stay: [address] PA-C  03/19/2024, 8:34 AM  Advanced Heart Failure Team Pager [phone] (M-F; 7a - 5p)  Please contact [facility] for night-coverage after hours (5p -7a ) and weekends on [email or web address]  Patient seen with PA, I formulated the plan and agree with the above note.   Co-ox 60% this morning off milrinone .  I/Os net negative 1841 with weight down 3 lbs.  Creatinine 2.83 yesterday. No BMET yet today.   Denies dyspnea.   General: NAD Neck: JVP 8-9 cm, no thyromegaly or thyroid nodule.  Lungs: Clear to auscultation bilaterally with normal respiratory effort. CV: Nondisplaced PMI.  Heart regular S1/S2, no S3/S4, no murmur.  No peripheral edema.  Abdomen: Soft, nontender, no hepatosplenomegaly, no distention.  Skin: Intact without lesions or rashes.  Neurologic: Alert and oriented x 3.  Psych: Normal affect. Extremities: No clubbing or cyanosis.  HEENT: Normal.   Still with mild volume overload but weight continues to trend down and stable off milrinone  with co-ox 60%.  We need a BMET and need to set up CVP this morning. If CVP < 10 and creatinine stable, will let him go home on torsemide  80 qam/40 qpm.   He remains in NSR on po amiodarone  and Xarelto .   Could go home if CVP and creatinine stable, we will recheck.   [name] 03/19/2024 8:59 AM

## 2024-03-20 ENCOUNTER — Telehealth: Payer: Self-pay

## 2024-03-20 NOTE — Transitions of Care (Post Inpatient/ED Visit) (Signed)
 03/20/2024  Name: Justin [name] MRN: [id number] DOB: 11/23/1980  Today's TOC FU Call Status: Today's TOC FU Call Status:: Successful TOC FU Call Completed TOC FU Call Complete Date: 03/20/24 Patient's Name and Date of Birth confirmed.  Transition Care Management Follow-up Telephone Call Date of Discharge: 03/19/24 Discharge Facility: [facility]) Type of Discharge: Inpatient Admission Primary Inpatient Discharge Diagnosis:: acute hypoxic respiratory failure How have you been since you were released from the hospital?: Better Any questions or concerns?: No  Items Reviewed: Did you receive and understand the discharge instructions provided?: Yes Medications obtained,verified, and reconciled?: Yes (Medications Reviewed) (Med list reviewed at length.He has all meds and did not have any questions about the med regime. I also reminded him of the medications he is to stop taking and he stated he understood and could see those medications listed on AVS noting to stop taking.) Any new allergies since your discharge?: No Dietary orders reviewed?: Yes Type of Diet Ordered:: heart healthy, low sodium, diabetic Do you have support at home?: Yes People in Home [RPT]: alone Name of Support/Comfort Primary Source: He said he rents a room but did not specify who can assist him if needed  Medications Reviewed Today: Medications Reviewed Today     Reviewed by Marvis Bradley, RN (Case Manager) on 03/20/24 at 1652  Med List Status: <None>   Medication Order Taking? Sig Documenting Provider Last Dose Status Informant  amiodarone  (PACERONE ) 200 MG tablet [id number]  Take 2 tablets (400 mg total) by mouth 2 (two) times daily for 6 days, THEN 1 tablet (200 mg total) 2 (two) times daily for 7 days, THEN 1 tablet (200 mg total) daily.   Active   atorvastatin  (LIPITOR) 20 MG tablet [id number]  Take 1 tablet (20 mg total) by mouth daily. Lee, [place], NP  Active   empagliflozin  (JARDIANCE ) 10  MG TABS tablet [id number]  Take 1 tablet (10 mg total) by mouth daily. Smucker, Melvenia, MD  Active   glimepiride  (AMARYL ) 2 MG tablet [id number] No Take 2 mg by mouth daily. [provider] 03/08/2024 Active Self, Pharmacy Records  hydrALAZINE  (APRESOLINE ) 100 MG tablet [id number]  Take 1 tablet (100 mg total) by mouth with breakfast, with lunch, and with evening meal.   Active   isosorbide  mononitrate (IMDUR ) 30 MG 24 hr tablet [id number]  Take 3 tablets (90 mg total) by mouth daily.   Active   potassium chloride  SA (KLOR-CON  M) 20 MEQ tablet [id number]  Take 1 tablet (20 mEq total) by mouth daily.   Active   rivaroxaban  (XARELTO ) 20 MG TABS tablet [id number]  Take 1 tablet (20 mg total) by mouth daily with supper. Norrine Sharper, MD  Active   torsemide  (DEMADEX ) 20 MG tablet [id number]  Take 4 tablets in the morning (80mg  total) and 2 tablets (40mg  total) in the evening.   Active             Home Care and Equipment/Supplies: Were Home Health Services Ordered?: No Any new equipment or medical supplies ordered?: No  Functional Questionnaire: Do you need assistance with bathing/showering or dressing?: No Do you need assistance with meal preparation?: No Do you need assistance with eating?: No Do you have difficulty maintaining continence: No Do you need assistance with getting out of bed/getting out of a chair/moving?: No Do you have difficulty managing or taking your medications?: No  Follow up appointments reviewed: PCP Follow-up appointment confirmed?: Yes Date of PCP follow-up appointment?: 03/27/24 Follow-up Provider: Rosaline Loving,  NP Specialist Hospital Follow-up appointment confirmed?: Yes Date of Specialist follow-up appointment?: 03/24/24 Follow-Up Specialty Provider:: [facility]- Dr Johnita Sings Do you need transportation to your follow-up appointment?: No Do you understand care options if your condition(s) worsen?: Yes-patient verbalized understanding    SIGNATURE Slater Diesel, RN

## 2024-03-21 ENCOUNTER — Telehealth (HOSPITAL_COMMUNITY): Payer: Self-pay | Admitting: Pharmacy Technician

## 2024-03-21 NOTE — Telephone Encounter (Addendum)
 Advanced Heart Failure Patient Advocate Encounter  Sent in Jardiance  assistance application to [facility], as patient is uninsured.  Document scanned to chart.   Will follow up.

## 2024-03-24 ENCOUNTER — Encounter (HOSPITAL_COMMUNITY): Payer: Self-pay | Admitting: Cardiology

## 2024-03-24 ENCOUNTER — Other Ambulatory Visit (HOSPITAL_COMMUNITY): Payer: Self-pay

## 2024-03-24 ENCOUNTER — Ambulatory Visit (HOSPITAL_COMMUNITY)
Admit: 2024-03-24 | Discharge: 2024-03-24 | Disposition: A | Discharge status: Home / Self Care | Payer: MEDICAID | Source: Ambulatory Visit | Attending: Cardiology | Admitting: Cardiology

## 2024-03-24 VITALS — BP 128/70 | HR 98 | Wt 239.0 lb

## 2024-03-24 DIAGNOSIS — I130 Hypertensive heart and chronic kidney disease with heart failure and stage 1 through stage 4 chronic kidney disease, or unspecified chronic kidney disease: Secondary | ICD-10-CM | POA: Insufficient documentation

## 2024-03-24 DIAGNOSIS — E1122 Type 2 diabetes mellitus with diabetic chronic kidney disease: Secondary | ICD-10-CM | POA: Insufficient documentation

## 2024-03-24 DIAGNOSIS — Z79899 Other long term (current) drug therapy: Secondary | ICD-10-CM | POA: Insufficient documentation

## 2024-03-24 DIAGNOSIS — Z5971 Insufficient health insurance coverage: Secondary | ICD-10-CM | POA: Insufficient documentation

## 2024-03-24 DIAGNOSIS — I5032 Chronic diastolic (congestive) heart failure: Secondary | ICD-10-CM

## 2024-03-24 DIAGNOSIS — Z7901 Long term (current) use of anticoagulants: Secondary | ICD-10-CM | POA: Insufficient documentation

## 2024-03-24 DIAGNOSIS — R590 Localized enlarged lymph nodes: Secondary | ICD-10-CM | POA: Insufficient documentation

## 2024-03-24 DIAGNOSIS — I5022 Chronic systolic (congestive) heart failure: Secondary | ICD-10-CM | POA: Insufficient documentation

## 2024-03-24 DIAGNOSIS — I340 Nonrheumatic mitral (valve) insufficiency: Secondary | ICD-10-CM | POA: Insufficient documentation

## 2024-03-24 DIAGNOSIS — F1411 Cocaine abuse, in remission: Secondary | ICD-10-CM | POA: Insufficient documentation

## 2024-03-24 DIAGNOSIS — N184 Chronic kidney disease, stage 4 (severe): Secondary | ICD-10-CM | POA: Insufficient documentation

## 2024-03-24 DIAGNOSIS — I4819 Other persistent atrial fibrillation: Secondary | ICD-10-CM | POA: Insufficient documentation

## 2024-03-24 DIAGNOSIS — Z7984 Long term (current) use of oral hypoglycemic drugs: Secondary | ICD-10-CM | POA: Insufficient documentation

## 2024-03-24 DIAGNOSIS — I484 Atypical atrial flutter: Secondary | ICD-10-CM | POA: Insufficient documentation

## 2024-03-24 DIAGNOSIS — I480 Paroxysmal atrial fibrillation: Secondary | ICD-10-CM

## 2024-03-24 DIAGNOSIS — E1165 Type 2 diabetes mellitus with hyperglycemia: Secondary | ICD-10-CM | POA: Insufficient documentation

## 2024-03-24 LAB — COMPREHENSIVE METABOLIC PANEL WITH GFR
ALT: 23 U/L (ref 0–44)
AST: 25 U/L (ref 15–41)
Albumin: 3.4 g/dL — ABNORMAL LOW (ref 3.5–5.0)
Alkaline Phosphatase: 110 U/L (ref 38–126)
Anion gap: 14 (ref 5–15)
BUN: 26 mg/dL — ABNORMAL HIGH (ref 6–20)
CO2: 22 mmol/L (ref 22–32)
Calcium: 9 mg/dL (ref 8.9–10.3)
Chloride: 102 mmol/L (ref 98–111)
Creatinine, Ser: 2.42 mg/dL — ABNORMAL HIGH (ref 0.61–1.24)
GFR, Estimated: 33 mL/min — ABNORMAL LOW (ref >60)
Glucose, Bld: 121 mg/dL — ABNORMAL HIGH (ref 70–99)
Potassium: 3.6 mmol/L (ref 3.5–5.1)
Sodium: 138 mmol/L (ref 135–145)
Total Bilirubin: 0.6 mg/dL (ref 0.0–1.2)
Total Protein: 7.5 g/dL (ref 6.5–8.1)

## 2024-03-24 LAB — TSH: TSH: 12.924 u[IU]/mL — ABNORMAL HIGH (ref 0.350–4.500)

## 2024-03-24 LAB — BRAIN NATRIURETIC PEPTIDE: B Natriuretic Peptide: 1035.3 pg/mL — ABNORMAL HIGH (ref 0.0–100.0)

## 2024-03-24 MED ORDER — METOPROLOL SUCCINATE ER 25 MG PO TB24
25.0000 mg | ORAL_TABLET | Freq: Every day | ORAL | 3 refills | Status: DC
  Filled 2024-03-24 – 2024-03-28 (×2): qty 90, 90d supply, fill #0

## 2024-03-24 MED ORDER — AMIODARONE HCL 200 MG PO TABS
200.0000 mg | ORAL_TABLET | Freq: Every day | ORAL | 3 refills | Status: DC
  Filled 2024-03-24: qty 90, 90d supply, fill #0

## 2024-03-24 MED ORDER — RIVAROXABAN 15 MG PO TABS
15.0000 mg | ORAL_TABLET | Freq: Every day | ORAL | 11 refills | Status: DC
  Filled 2024-03-24: qty 30, 30d supply, fill #0

## 2024-03-24 NOTE — Patient Instructions (Signed)
 DECREASE Amiodarone  to 200 mg daily.  DECREASE Xarelto  to 15 mg daily.  START Toprol  XL 25 mg daily.  Labs done today, your results will be available in MyChart, we will contact you for abnormal readings.   Cardiac Sarcoidosis/Inflammation PET Scan Patient Instructions  Please report to Radiology at the [facility] Main Entrance 15 minutes early for your test.  [address] [place], [place] [address] BRING FOOD DIARY WITH YOU TO THIS APPOINTMENT For 24 hours before the test: Do not exercise! Do not eat after 5 pm the day before your test! To make sure that your scanning results are accurate, you MUST follow the sarcoid prep meal diet starting the day before your PET scan. This diet involves eating no carbohydrates 24 hours before the test.  You will keep a log of all that you eat the day before your test. If you have questions or do not understand this diet, please call [phone] for more information. If you are unable to follow this diet, please discuss an alternative strategy with the coordinator.  If you are diabetic, continue your diabetes medications as usual on the day before until you begin to fast. NO DIABETES MEDICATIONS ONCE YOU BEGIN TO FAST. What foods can I eat the day before my test?  Drink only water or black coffee (WITHOUT sugar, artificial sweetener, cream, or milk). Eggs (prepared without milk or cheese)  Meat that is either broiled or pan fried in butter WITHOUT breading (chicken, [place], bacon, meat-only sausage, hamburger, steak, fish) Butter, salt & pepper What foods must I AVOID the day before my test?  Do not consume alcoholic beverages, sodas, fruit juice, coffee creamer, or sports drinks  Do not eat vegetables, beans, nuts, fruits, juices, bread, grains, rice, pasta, potatoes, or any baked goods Do not eat dairy products (milk, cheese, etc.)  Do not eat mayonnaise, ketchup, tartar sauce, mustard, or other condiments Do not add sugar,  artificial sweeteners, or Splenda (sucralose) to foods or drinks  Do not eat breaded foods (like fried chicken)  Do not eat sweets, candy, gum, sweetened cough drops, lozenges, or sugar  Do not eat sweetened, grilled, or cured meats or meat with carbohydrate-containing additives (some sausages, ham, sweetened bacon) Suggested items for breakfast, lunch, or dinner:  Breakfast  3 to 5 fatty sausage links fried in butter. 3 to 5 bacon strips.  3 eggs pan fried in butter (no milk or cheese).  Lunch/Dinner  2 hamburger patties fried in butter. Chicken or fatty fish pan fried in butter. No breading. 8 oz. fatty steak pan fried in butter.  Beverages  Drink only water or black coffee. DO NOT ADD SUGAR, ARTIFICIAL SWEETENER, CREAM, OR MILK  Please call [facility] to cancel or reschedule your appointment at [phone] For more information and frequently asked questions, please visit our website : [email or web address]   Cardiac Sarcoidosis/Inflammation PET Scan  Food Diary Name: _____________________________ Please fill in EXACTLY what you have eaten and when for 24 hours PRIOR to your test date.  Time Food/Drink Comments  Breakfast                Lunch                Dinner                Snacks                 DO NOT EXERCISE THE DAY BEFORE YOUR TEST  DO NOT EAT AFTER 5 PM THE DAY BEFORE YOUR TEST.  ON THE DAY OF YOUR TEST, DO NOT EAT ANY FOOD AND ONLY DRINK CLEAR WATER! PLEASE BRING THIS FOOD DIARY WITH YOU TO YOUR APPOINTMENT   You have been referred to [facility]. They will call you to arrange your appointment.  Please follow up with our heart failure pharmacist as scheduled.  Your physician recommends that you schedule a follow-up appointment in: 1 month.  If you have any questions or concerns before your next appointment please send us  a message through [place] or call our office at [phone].    TO LEAVE A MESSAGE FOR THE NURSE  SELECT OPTION 2, PLEASE LEAVE A MESSAGE INCLUDING: YOUR NAME DATE OF BIRTH CALL BACK NUMBER REASON FOR CALL**this is important as we prioritize the call backs  YOU WILL RECEIVE A CALL BACK THE SAME DAY AS LONG AS YOU CALL BEFORE 4:00 PM  At the [facility] Clinic, you and your health needs are our priority. As part of our continuing mission to provide you with exceptional heart care, we have created designated Provider Care Teams. These Care Teams include your primary Cardiologist (physician) and Advanced Practice Providers (APPs- Physician Assistants and Nurse Practitioners) who all work together to provide you with the care you need, when you need it.   You may see any of the following providers on your designated Care Team at your next follow up: Dr Toribio Fuel Dr [name] Dr. Ria Commander Dr. Morene Brownie Amy Lenetta, NP Caffie Shed, [place] [facility] [place], [place] Beckey Coe, NP [place] Lee, NP Ellouise Class, NP Tinnie Redman, PharmD Jaun Bash, PharmD   Please be sure to bring in all your medications bottles to every appointment.    Thank you for choosing [facility]

## 2024-03-24 NOTE — Progress Notes (Signed)
 PCP: Celestia Rosaline SQUIBB, NP HF Cardiology: Dr. Rolan  Chief complaint: CHF  43 y.o. Spanish-speaking male with history of chronic systolic CHF, uncontrolled HTN, DM II, mitral regurgitation, ETOH abuse, hx cocaine use.   Heart failure dates back to 2017. Admitted to [facility] with acute systolic CHF and hypertensive urgency. Echo showed severe LV dsyfunction and severe LVH. Cath with no CAD.   Admitted in 6/23 with acute respiratory failure with hypoxia requiring BiPAP due to CHF. He was diuresed and initiated on GDMT. LHC showed no significant CAD.  Cardiac MRI in 6/23 showed LVEF 29%, LV severely dilated, asymmetric LVH with basal septum measuring 17 mm, RVEF 45%, patchy LGE in septum and RV insertion sites + subepicardial LGE in inferolateral wall. Scar pattern and asymmetric hypertrophy could be consistent with hypertrophic cardiomyopathy. However, due to subepicardial LGE in inferolateral wall and presence of mediastinal lymphadenopathy, cardiac PET recommended to rule out cardiac sarcoidosis.After discharge he no-showed TOC and cardiology clinic follow-ups.    Admitted 1/24 and 7/24 for CHF exacerbation and volume overload requiring IV diuresis due to noncompliance with medications. After last admission was restarted on Entresto , Lasix , Jardiance , Spiro and Metoprolol . He no showed to post hospital follow ups with [facility] Failure [facility] and Cardiology.   Echo 6/24 showed EF 20-25% gHK, GIIDD, RV mod reduced, mild MVR, IVC dilated.   Admitted 12/24 with A/C HFrEF in the setting of noncompliance. Diuresed and GDMT restarted.    Admitted in 7/25 with CHF exacerbation.  He had been off cardiac meds. Echo this admit EF 20-25%, moderate LVH, LV with GHK, RA severely reduced, LA/RA severely dilated, mod-severe MR, mild-mod TR. Cardiorenal syndrome with creatinine > 3.  He was diuresed and required milrinone  for low output HF.  This was titrated off.  He was started on amiodarone   in setting of atypical atrial flutter.  He was cardioverted back to NSR this admission.  TEE showed EF 20-25%, mild LV dilation, mild LVH, posterior mitral leaflet restriction with severe functional MR, MR ERO 0.6 cm^2 by PISA.   He returns for post-hospital followup.  He is taking all his meds.  Weight has been stable, no edema.  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  Mild dyspnea with long walks or stairs.  No chest pain.  No orthopnea/PND.    ECG (personally reviewed): NSR, LVH  Labs (6/25): LDL 30 Labs (7/25): K 3.6, creatinine 2.69  PMH: 1. CKD stage 4 2. Hyperlipidemia 3. HTN 4. Atrial fibrillation/flutter: DCCV 7/25. 5. Prior ETOH/cocaine abuse 6. Chronic systolic CHF: Diagnosed in 2017. Nonischemic cardiomyopathy.  - LHC (2017): No CAD.  - Echo (2017): Severe LV dysfunction and severe LVH.  - LHC (6/23): No significant CAD.  - Cardiac MRI (6/23): LVEF 29%, LV severely dilated, asymmetric LVH with basal septum measuring 17 mm, RVEF 45%, patchy LGE in septum and RV insertion sites + subepicardial LGE in inferolateral wall. Scar pattern and asymmetric hypertrophy could be consistent with hypertrophic cardiomyopathy. However, due to subepicardial LGE in inferolateral wall and presence of mediastinal lymphadenopathy, cardiac PET recommended to rule out cardiac sarcoidosis. - Echo (6/24): EF 20-25% gHK, GIIDD, RV mod reduced function, mild MVR - Echo (7/25): EF 20-25%, moderate LVH, LV with GHK, RA severely reduced, LA/RA severely dilated, mod-severe MR, mild-mod TR - TEE (7/25): EF 20-25%, mild LV dilation, mild LVH, posterior mitral leaflet restriction with severe functional MR, MR ERO 0.6 cm^2 by PISA.  7. Mitral regurgitation: Severe functional MR by TEE in 7/25; posterior mitral valve leaflet restriction with MR ERO 0.6 cm^2 by PISA.  8. Type 2 diabetes  Family History  Problem Relation Age of Onset   Hypertension Father    Social History    Socioeconomic History   Marital status: Single    Spouse name: Not on file   Number of children: 3   Years of education: Not on file   Highest education level: 7th grade  Occupational History   Occupation: Education administrator    Comment: varies  Tobacco Use   Smoking status: Never   Smokeless tobacco: Never  Vaping Use   Vaping status: Never Used  Substance and Sexual Activity   Alcohol use: Yes    Alcohol/week: 30.0 standard drinks of alcohol    Types: 30 Cans of beer per week    Comment: occ   Drug use: Not Currently    Types: Crack cocaine, Cocaine    Comment: 2 weeks   Sexual activity: Not on file  Other Topics Concern   Not on file  Social History Narrative   Not on file   Social Drivers of Health   Financial Resource Strain: Medium Risk (06/05/2022)   Overall Financial Resource Strain (CARDIA)    Difficulty of Paying Living Expenses: Somewhat hard  Food Insecurity: Food Insecurity Present (03/10/2024)   Hunger Vital Sign    Worried About Running Out of Food in the Last Year: Sometimes true    Ran Out of Food in the Last Year: Sometimes true  Transportation Needs: No Transportation Needs (03/10/2024)   PRAPARE - Administrator, Civil Service (Medical): No    Lack of Transportation (Non-Medical): No  Physical Activity: Not on file  Stress: Not on file  Social Connections: Not on file  Intimate Partner Violence: Not At Risk (03/10/2024)   Humiliation, Afraid, Rape, and Kick questionnaire    Fear of Current or Ex-Partner: No    Emotionally Abused: No    Physically Abused: No    Sexually Abused: No   ROS: All systems reviewed and negative except as per HPI.   Current Outpatient Medications  Medication Sig Dispense Refill   atorvastatin  (LIPITOR) 20 MG tablet Take 1 tablet (20 mg total) by mouth daily. 30 tablet 12   empagliflozin  (JARDIANCE ) 10 MG TABS tablet Take 1 tablet (10 mg total) by mouth daily. 30 tablet 0   hydrALAZINE  (APRESOLINE ) 100 MG tablet  Take 1 tablet (100 mg total) by mouth with breakfast, with lunch, and with evening meal. 90 tablet 0   isosorbide  mononitrate (IMDUR ) 30 MG 24 hr tablet Take 3 tablets (90 mg total) by mouth daily. 90 tablet 0   metoprolol  succinate (TOPROL  XL) 25 MG 24 hr tablet Take 1 tablet (25 mg total) by mouth daily. 90 tablet 3   potassium chloride  SA (KLOR-CON  M) 20 MEQ tablet Take 1 tablet (20 mEq total) by mouth daily. 30 tablet 0   torsemide  (DEMADEX ) 20 MG tablet Take 4 tablets in the morning (80mg  total) and 2 tablets (40mg  total) in the evening. 180 tablet 0   amiodarone  (PACERONE ) 200 MG tablet Take 1 tablet (200 mg total) by mouth daily. 90 tablet 3   glimepiride  (AMARYL ) 2 MG tablet Take 2 mg by mouth daily. (Patient not taking: Reported on 03/24/2024)     Rivaroxaban  (XARELTO ) 15 MG TABS tablet Take 1 tablet (15 mg total) by mouth daily with supper. 30 tablet 11  No current facility-administered medications for this encounter.   BP 128/70   Pulse 98   Wt 108.4 kg (239 lb)   SpO2 94%   BMI 36.34 kg/m  General: NAD Neck: No JVD, no thyromegaly or thyroid  nodule.  Lungs: Clear to auscultation bilaterally with normal respiratory effort. CV: Nondisplaced PMI.  Heart regular S1/S2, no S3/S4, 2/6 HSM apex.  Trace ankle edema.  No carotid bruit.  Normal pedal pulses.  Abdomen: Soft, nontender, no hepatosplenomegaly, no distention.  Skin: Intact without lesions or rashes.  Neurologic: Alert and oriented x 3.  Psych: Normal affect. Extremities: No clubbing or cyanosis.  HEENT: Normal.   Assessment/Plan: 1. Chronic systolic CHF: Long history of nonischemic cardiomyopathy. Cath in 6/23 with no significant CAD, CI 1.99.  Substance abuse (cocaine and ETOH) thought to play a role in cardiomyopathy, also uncontrolled HTN in the past.  However, cardiac MRI was done in 6/23 showing LVEF 29%, LV severely dilated, asymmetric LVH with basal septum measuring 17 mm, RVEF 45%, patchy LGE in septum and RV  insertion sites + subepicardial LGE in inferolateral wall. Scar pattern and asymmetric hypertrophy could be consistent with hypertrophic cardiomyopathy. However, due to subepicardial LGE in inferolateral wall and presence of mediastinal lymphadenopathy, cardiac PET recommended to rule out cardiac sarcoidosis.  This was never done due to lack of insurance.  Echo in 7/25 showed EF 20-25% with moderate concentric LVH, severe RV dysfunction, probably severe functional MR with PISA ERO 0.47 cm^2, IVC dilated.  Low output HF noted at 7/25 admission, he was started on milrinone  and diuresed, milrinone  eventually discontinued.  NYHA class II currently, not volume overloaded on exam. GDMT limited by CKD stage 4. - Continue hydralazine  100 mg tid and Imdur  90 mg daily.  - Continue torsemide  80 mg qam/40 qpm - Continue Jardiance  10 mg daily.  - Add Toprol  XL 25 mg daily.  - Repeat echo in 3 months, reassess MR with medical management.  - I would like to get a cardiac PET to rule out cardiac sarcoidosis, but this is probably not an option with no insurance. - Not a good candidate for advanced therapies with noncompliance, lack of insurance, RV dysfunction and CKD stage 4. 2. Atrial fibrillation/atypical flutter: Persistent atypical atrial flutter in 7/25, TEE-guided DCCV back to NSR and he remains in NSR today.   - Continue Xarelto  but decrease to 15 mg daily based on renal function.  - He can decrease amiodarone  to 200 mg daily.  Check LFTs and TSH, needs regular eye exam.  - Eventual fibrillation/flutter ablation would be ideal.  3. CKD stage IV: Suspect cardiorenal syndrome + DM2 + HTN.  Last creatinine was 2.69.  - BMET today.  - Refer to nephrology.  4. Mitral regurgitation: Severe functional MR.  TEE 7/25 showed severe eccentric, posteriorly-directed MR with restricted posterior leaflet, MR ERO 0.6 cm^2 by PISA.  - Will need to follow closely with GDMT titration, if does not improve would favor mTEER but  lack of insurance coverage will be an issue. Repeat echo in 3 months as above. 5. ETOH/cocaine abuse: Says he has quit.  6. DM2: Poor control.  Per PCP.   7. SDOH: He does not have insurance.  - He will need a social work consult.   Followup HF pharmacist in 2 wks for med titration, followup APP 1 month.   I spent 42 minutes reviewing records, interviewing/examining patient, and managing orders.     Justin [name] 03/24/2024

## 2024-03-25 ENCOUNTER — Other Ambulatory Visit (HOSPITAL_COMMUNITY): Payer: Self-pay

## 2024-03-25 ENCOUNTER — Ambulatory Visit (HOSPITAL_COMMUNITY): Payer: Self-pay | Admitting: Cardiology

## 2024-03-25 DIAGNOSIS — R7989 Other specified abnormal findings of blood chemistry: Secondary | ICD-10-CM

## 2024-03-25 DIAGNOSIS — I5032 Chronic diastolic (congestive) heart failure: Secondary | ICD-10-CM

## 2024-03-25 MED ORDER — RIVAROXABAN 15 MG PO TABS: 15.0000 mg | ORAL_TABLET | Freq: Every day | ORAL | 11 refills | Status: DC

## 2024-03-26 ENCOUNTER — Other Ambulatory Visit (HOSPITAL_COMMUNITY): Payer: Self-pay

## 2024-03-27 ENCOUNTER — Encounter (INDEPENDENT_AMBULATORY_CARE_PROVIDER_SITE_OTHER): Payer: Self-pay | Admitting: Primary Care

## 2024-03-27 ENCOUNTER — Other Ambulatory Visit: Payer: Self-pay

## 2024-03-27 ENCOUNTER — Telehealth (HOSPITAL_COMMUNITY): Payer: Self-pay

## 2024-03-27 ENCOUNTER — Ambulatory Visit (INDEPENDENT_AMBULATORY_CARE_PROVIDER_SITE_OTHER): Payer: Self-pay | Admitting: Primary Care

## 2024-03-27 ENCOUNTER — Other Ambulatory Visit (HOSPITAL_COMMUNITY): Payer: Self-pay

## 2024-03-27 VITALS — BP 154/106 | HR 101 | Resp 16 | Ht 66.0 in | Wt 238.2 lb

## 2024-03-27 DIAGNOSIS — Z7984 Long term (current) use of oral hypoglycemic drugs: Secondary | ICD-10-CM

## 2024-03-27 DIAGNOSIS — Z09 Encounter for follow-up examination after completed treatment for conditions other than malignant neoplasm: Secondary | ICD-10-CM

## 2024-03-27 DIAGNOSIS — Z7985 Long-term (current) use of injectable non-insulin antidiabetic drugs: Secondary | ICD-10-CM

## 2024-03-27 DIAGNOSIS — E119 Type 2 diabetes mellitus without complications: Secondary | ICD-10-CM

## 2024-03-27 MED ORDER — TRULICITY 0.75 MG/0.5ML ~~LOC~~ SOAJ
0.7500 mg | SUBCUTANEOUS | 1 refills | Status: DC
  Filled 2024-03-27: qty 2, 28d supply, fill #0

## 2024-03-27 MED ORDER — METFORMIN HCL 1000 MG PO TABS
1000.0000 mg | ORAL_TABLET | Freq: Two times a day (BID) | ORAL | 1 refills | Status: DC
  Filled 2024-03-27: qty 180, 90d supply, fill #0

## 2024-03-27 NOTE — Telephone Encounter (Signed)
 Referral faxed to [facility] on 03/27/24

## 2024-03-27 NOTE — Progress Notes (Signed)
 Subjective:   Justin [name] is a 43 y.o. Hispanic obese male presents for establish care.  Interpreter present [employer].  Also his father there is present he has given him permission to be at his appointment.  Patient was admit to the hospital was 03/09/24, patient was discharged from the hospital on 03/19/24, patient was admitted for: Hypoxia, combined systolic and diastolic heart failure and edema.  Hospital follow-up completed by cardiology.  Past Medical History:  Diagnosis Date   Heart failure with reduced ejection fraction [facility]) 2017   [facility]   Hypertension 2017   [facility]     No Known Allergies  Current Outpatient Medications on File Prior to Visit  Medication Sig Dispense Refill   amiodarone  (PACERONE ) 200 MG tablet Take 1 tablet (200 mg total) by mouth daily. 90 tablet 3   atorvastatin  (LIPITOR) 20 MG tablet Take 1 tablet (20 mg total) by mouth daily. 30 tablet 12   empagliflozin  (JARDIANCE ) 10 MG TABS tablet Take 1 tablet (10 mg total) by mouth daily. 30 tablet 0   glimepiride  (AMARYL ) 2 MG tablet Take 2 mg by mouth daily. (Patient not taking: Reported on 03/24/2024)     hydrALAZINE  (APRESOLINE ) 100 MG tablet Take 1 tablet (100 mg total) by mouth with breakfast, with lunch, and with evening meal. 90 tablet 0   isosorbide  mononitrate (IMDUR ) 30 MG 24 hr tablet Take 3 tablets (90 mg total) by mouth daily. 90 tablet 0   metoprolol  succinate (TOPROL  XL) 25 MG 24 hr tablet Take 1 tablet (25 mg total) by mouth daily. 90 tablet 3   potassium chloride  SA (KLOR-CON  M) 20 MEQ tablet Take 1 tablet (20 mEq total) by mouth daily. 30 tablet 0   Rivaroxaban  (XARELTO ) 15 MG TABS tablet Take 1 tablet (15 mg total) by mouth daily with supper. 30 tablet 11   torsemide  (DEMADEX ) 20 MG tablet Take 4 tablets in the morning (80mg  total) and 2 tablets (40mg  total) in the evening. 180 tablet 0   No current facility-administered medications on file prior to visit.     Review of System: ROS Comprehensive ROS Pertinent positive and negative noted in HPI   Objective:  BP (!) 154/106 (BP Location: Right Arm, Patient Position: Sitting, Cuff Size: Large)   Pulse (!) 101   Resp 16   Ht 5' 6 (1.676 m)   Wt 238 lb 3.2 oz (108 kg)   SpO2 96%   BMI 38.45 kg/m   Filed Weights   03/27/24 1550  Weight: 238 lb 3.2 oz (108 kg)    Physical Exam Vitals reviewed.  Constitutional:      Appearance: He is obese.  HENT:     Head: Normocephalic.     Right Ear: Tympanic membrane and external ear normal.     Left Ear: Tympanic membrane and external ear normal.     Nose: Nose normal.  Eyes:     Extraocular Movements: Extraocular movements intact.     Pupils: Pupils are equal, round, and reactive to light.  Cardiovascular:     Rate and Rhythm: Normal rate and regular rhythm.  Pulmonary:     Effort: Pulmonary effort is normal.     Breath sounds: Normal breath sounds.  Abdominal:     General: Bowel sounds are normal. There is distension.     Palpations: Abdomen is soft.  Musculoskeletal:        General: Normal range of motion.  Skin:    General:  Skin is warm and dry.  Neurological:     Mental Status: He is oriented to person, place, and time.  Psychiatric:        Mood and Affect: Mood normal.        Behavior: Behavior normal.        Thought Content: Thought content normal.        Judgment: Judgment normal.      Assessment:  [place] was seen today for hospitalization follow-up.  Diagnoses and all orders for this visit:  Type 2 diabetes mellitus without complication, without long-term current use of insulin  (HCC) -     Cancel: POCT glycosylated hemoglobin (Hb A1C) -     Microalbumin / creatinine urine ratio  Other orders -     Discontinue: metFORMIN  (GLUCOPHAGE ) 1000 MG tablet; Take 1 tablet (1,000 mg total) by mouth 2 (two) times daily with a meal. -     Dulaglutide  (TRULICITY ) 0.75 MG/0.5ML SOAJ; Inject 0.75 mg into the skin once a week.      This note has been created with Education officer, environmental. Any transcriptional errors are unintentional.   Return in about 3 months (around 06/27/2024).  Rosaline SHAUNNA Bohr, NP 03/30/2024, 9:32 PM

## 2024-03-27 NOTE — Telephone Encounter (Signed)
 Advanced Heart Failure Patient Advocate Encounter   Patient was approved to receive Jardiance  from [employer]  Effective dates: 03/25/24 through 03/25/25

## 2024-03-28 ENCOUNTER — Other Ambulatory Visit: Payer: Self-pay

## 2024-03-28 ENCOUNTER — Other Ambulatory Visit (HOSPITAL_COMMUNITY): Payer: Self-pay

## 2024-03-30 ENCOUNTER — Encounter (INDEPENDENT_AMBULATORY_CARE_PROVIDER_SITE_OTHER): Payer: Self-pay | Admitting: Primary Care

## 2024-03-31 NOTE — Telephone Encounter (Signed)
 Lab results and instructions per Dr. Rolan as follows:  Creatinine stable. Need to check free T3 and free T4 with elevated TSH.  Free T3 and T4 added on to Pharmacy Clinic visit on 04/08/24 with note added to scheduled visit.

## 2024-04-01 LAB — MICROALBUMIN / CREATININE URINE RATIO
Creatinine, Urine: 27.4 mg/dL
Microalb/Creat Ratio: 1807 mg/g{creat} — ABNORMAL HIGH (ref 0–29)
Microalbumin, Urine: 495 ug/mL

## 2024-04-06 ENCOUNTER — Ambulatory Visit: Payer: Self-pay | Admitting: Primary Care

## 2024-04-07 NOTE — Progress Notes (Incomplete)
 ***In Progress***    Advanced Heart Failure Clinic Note  PCP: Celestia Rosaline SQUIBB, NP HF Cardiology: Dr. Rolan  HPI: Justin [name] is a 43 y.o. Spanish-speaking male with history of chronic systolic CHF, uncontrolled HTN, DM II, mitral regurgitation, ETOH abuse, hx cocaine use.   Heart failure dates back to 2017. Admitted to [facility] with acute systolic CHF and hypertensive urgency. Echo showed severe LV dsyfunction and severe LVH. Cath with no CAD.   Admitted in 02/2022 with acute respiratory failure with hypoxia requiring BiPAP due to CHF. He was diuresed and initiated on GDMT. LHC showed no significant CAD. Cardiac MRI in 02/2022 showed LVEF 29%, LV severely dilated, asymmetric LVH with basal septum measuring 17 mm, RVEF 45%, patchy LGE in septum and RV insertion sites + subepicardial LGE in inferolateral wall. Scar pattern and asymmetric hypertrophy could be consistent with hypertrophic cardiomyopathy. However, due to subepicardial LGE in inferolateral wall and presence of mediastinal lymphadenopathy, cardiac PET recommended to rule out cardiac sarcoidosis. After discharge he no-showed TOC and cardiology clinic follow-ups.    Admitted 09/2022 and 03/2023 for CHF exacerbation and volume overload requiring IV diuresis due to noncompliance with medications. After last admission was restarted on Entresto , furosemide , Jardiance , spironolactone  and metoprolol  succinate. He no showed to post hospital follow ups with Advanced Heart Failure Clinic and Cardiology.   Echo 02/2023 showed EF 20-25% gHK, grade II DD, RV mod reduced, mild MVR, IVC dilated.   Admitted 08/2023 with A/C HFrEF in the setting of noncompliance. Diuresed and GDMT restarted.    Admitted in 03/2024 with CHF exacerbation.  He had been off cardiac meds. Echo this admit EF 20-25%, moderate LVH, LV with GHK, RA severely reduced, LA/RA severely dilated, mod-severe MR, mild-mod TR. Cardiorenal syndrome with  creatinine > 3.  He was diuresed and required milrinone  for low output HF. This was titrated off. He was started on amiodarone  in the setting of atypical atrial flutter. He was cardioverted back to NSR this admission. TEE showed EF 20-25%, mild LV dilation, mild LVH, posterior mitral leaflet restriction with severe functional MR, MR ERO 0.6 cm^2 by PISA. He was discharged on Jardiance  10 mg daily, hydralazine  100 mg TID, isosorbide  mononitrate 90 mg daily, torsemide  80 mg in the morning and 40 mg in the evening.   He returned for post-hospital follow up on 03/24/2024 with MD. He reported taking all his meds. Weight was stable, no edema. 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 No ETOH or cocaine use reported. He endorsed mild dyspnea with long walks or stairs. Denied chest pain, orthopnea/PND.   Today he returns to HF clinic for pharmacist medication titration. At last visit with MD, he was started on metoprolol  succinate 25 mg daily.   Overall feeling ***. Dizziness, lightheadedness, fatigue:  Chest pain or palpitations:  How is your breathing?: *** SOB: Able to complete all ADLs. Activity level ***  Weight at home pounds. Takes furosemide /torsemide /bumex *** mg *** daily.  LEE PND/Orthopnea  Appetite *** Low-salt diet:   Physical Exam Cost/affordability of meds   HF Medications: Metoprolol  succinate 25 mg daily  Jardiance  10 mg daily Hydralazine  100 mg TID  Isosorbide  mononitrate 90 mg daily Torsemide  80 mg in the morning and 40 mg in the evening   Has the patient been experiencing any side effects to the medications prescribed?  {YES NO:22349}  Does the patient have any problems obtaining medications due to transportation or finances?   {  YES WN:77650}  Understanding of regimen: {excellent/good/fair/poor:19665} Understanding of indications: {excellent/good/fair/poor:19665} Potential of compliance: {excellent/good/fair/poor:19665} Patient understands to  avoid NSAIDs. Patient understands to avoid decongestants.    Pertinent Lab Values: 03/24/2024: Serum creatinine 2.42, BUN 26, Potassium 3.6, Sodium 138, BNP 1035.3  Vital Signs: Weight: *** lbs (last clinic weight: 238.2 lbs) Blood pressure: *** mmHg Heart rate: *** bpm  Assessment/Plan: 1. Chronic systolic CHF:  Long history of nonischemic cardiomyopathy. Cath in 6/23 with no significant CAD, CI 1.99.  Substance abuse (cocaine and ETOH) thought to play a role in cardiomyopathy, also uncontrolled HTN in the past.  However, cardiac MRI was done in 6/23 showing LVEF 29%, LV severely dilated, asymmetric LVH with basal septum measuring 17 mm, RVEF 45%, patchy LGE in septum and RV insertion sites + subepicardial LGE in inferolateral wall. Scar pattern and asymmetric hypertrophy could be consistent with hypertrophic cardiomyopathy. However, due to subepicardial LGE in inferolateral wall and presence of mediastinal lymphadenopathy, cardiac PET recommended to rule out cardiac sarcoidosis.  This was never done due to lack of insurance.  Echo in 7/25 showed EF 20-25% with moderate concentric LVH, severe RV dysfunction, probably severe functional MR with PISA ERO 0.47 cm^2, IVC dilated.  Low output HF noted at 03/2024 admission, he was started on milrinone  and diuresed, milrinone  eventually discontinued.   - GDMT limited by CKD stage 4 - NYHA class ***, Volume ***.  - Continue torsemide  80 mg in the morning and 40 mg in the evening - Continue metoprolol  succinate 25 mg daily *** - Continue Jardiance  10 mg daily.  - Continue hydralazine  100 mg TID  - Continue isosorbide  mononitrate 90 mg daily.  - Repeat echo in 3 months, reassess MR with medical management.  - *** I would like to get a cardiac PET to rule out cardiac sarcoidosis, but this is probably not an option with no insurance.*** - Not a good candidate for advanced therapies with noncompliance, lack of insurance, RV dysfunction and CKD stage 4.  ***  2. Atrial fibrillation/atypical flutter:  -Persistent atypical atrial flutter in 7/25, TEE-guided DCCV back to NSR and he remains in NSR today.   - Continue Xarelto  but decrease to 15 mg daily based on renal function.  - He can decrease amiodarone  to 200 mg daily.  Check LFTs and TSH, needs regular eye exam.  - Eventual fibrillation/flutter ablation would be ideal.   3. CKD stage IV:  Suspect cardiorenal syndrome + DM2 + HTN.  Last creatinine was 2.69.  - BMET today.  - Refer to nephrology.   4. Mitral regurgitation:  Severe functional MR.  TEE 7/25 showed severe eccentric, posteriorly-directed MR with restricted posterior leaflet, MR ERO 0.6 cm^2 by PISA.  - Will need to follow closely with GDMT titration, if does not improve would favor mTEER but lack of insurance coverage will be an issue. Repeat echo in 3 months as above.  5. ETOH/cocaine abuse:  - Says he has quit.   6. DM2:  Poor control.   Per PCP.    7. SDOH:  - He does not have insurance.  - He will need a social work consult.   Follow up ***   Tinnie Redman, PharmD, BCPS, BCCP, CPP Heart Failure Clinic Pharmacist [phone]

## 2024-04-08 ENCOUNTER — Inpatient Hospital Stay (HOSPITAL_COMMUNITY): Admission: RE | Admit: 2024-04-08 | Payer: Self-pay | Source: Ambulatory Visit

## 2024-04-08 ENCOUNTER — Other Ambulatory Visit (HOSPITAL_COMMUNITY): Payer: Self-pay

## 2024-04-10 ENCOUNTER — Telehealth (HOSPITAL_COMMUNITY): Payer: Self-pay

## 2024-04-10 ENCOUNTER — Other Ambulatory Visit: Payer: Self-pay

## 2024-04-10 ENCOUNTER — Ambulatory Visit (HOSPITAL_COMMUNITY)
Admission: RE | Admit: 2024-04-10 | Discharge: 2024-04-10 | Disposition: A | Discharge status: Home / Self Care | Payer: MEDICAID | Source: Ambulatory Visit | Attending: Cardiology | Admitting: Cardiology

## 2024-04-10 ENCOUNTER — Other Ambulatory Visit (HOSPITAL_COMMUNITY): Payer: Self-pay

## 2024-04-10 DIAGNOSIS — I5032 Chronic diastolic (congestive) heart failure: Secondary | ICD-10-CM

## 2024-04-10 MED ORDER — METOPROLOL SUCCINATE ER 50 MG PO TB24
50.0000 mg | ORAL_TABLET | Freq: Every day | ORAL | 5 refills | Status: DC
  Filled 2024-04-10 (×2): qty 30, 30d supply, fill #0

## 2024-04-10 NOTE — Telephone Encounter (Signed)
-----   Message from [name] sent at 03/28/2024  7:04 AM EDT ----- Have him increase Toprol  XL to 50 mg daily. ----- Message ----- From: Celestia Rosaline SQUIBB, NP Sent: 03/27/2024   4:11 PM EDT To: Ezra GORMAN Shuck, MD  Establishing care with PCP taking all meds BP BP (!) 154/106 (BP Location: Right Arm,

## 2024-04-10 NOTE — Telephone Encounter (Signed)
 Spoke to [place] with the interpreters line. Patient's  Toprol   medication has been changed and updated in pt's chart.   Pt aware, agreeable, and verbalized understanding.

## 2024-04-15 ENCOUNTER — Encounter (HOSPITAL_COMMUNITY): Payer: Self-pay

## 2024-04-15 ENCOUNTER — Ambulatory Visit: Payer: Self-pay | Admitting: Pharmacist

## 2024-04-18 ENCOUNTER — Other Ambulatory Visit: Payer: Self-pay

## 2024-04-23 ENCOUNTER — Telehealth (HOSPITAL_COMMUNITY): Payer: Self-pay

## 2024-04-23 NOTE — Progress Notes (Incomplete)
 ADVANCED HF CLINIC NOTE PCP: Celestia Rosaline SQUIBB, NP HF Cardiology: Dr. Rolan  HPI: 43 y.o. spanish-speaking male with history of chronic systolic CHF, uncontrolled HTN, DM II, mitral regurgitation, ETOH abuse, hx cocaine use.   Heart failure dates back to 2017. Admitted to [facility] with acute systolic CHF and hypertensive urgency. Echo showed severe LV dsyfunction and severe LVH. Cath with no CAD.   Admitted in 6/23 with acute respiratory failure with hypoxia requiring BiPAP due to CHF. He was diuresed and initiated on GDMT. LHC showed no significant CAD.  Cardiac MRI in 6/23 showed LVEF 29%, LV severely dilated, asymmetric LVH with basal septum measuring 17 mm, RVEF 45%, patchy LGE in septum and RV insertion sites + subepicardial LGE in inferolateral wall. Scar pattern and asymmetric hypertrophy could be consistent with hypertrophic cardiomyopathy. However, due to subepicardial LGE in inferolateral wall and presence of mediastinal lymphadenopathy, cardiac PET recommended to rule out cardiac sarcoidosis.After discharge he no-showed TOC and cardiology clinic follow-ups.    Admitted 1/24 and 7/24 for CHF exacerbation and volume overload requiring IV diuresis due to noncompliance with medications. After last admission was restarted on Entresto , Lasix , Jardiance , Spiro and Metoprolol . He no showed to post hospital follow ups with [facility] and Cardiology.   Echo 6/24 showed EF 20-25% gHK, GIIDD, RV mod reduced, mild MVR, IVC dilated.   Admitted 12/24 with A/C HFrEF in the setting of noncompliance. Diuresed and GDMT restarted.    Admitted in 7/25 with CHF exacerbation.  He had been off cardiac meds. Echo this admit EF 20-25%, moderate LVH, LV with GHK, RA severely reduced, LA/RA severely dilated, mod-severe MR, mild-mod TR. Cardiorenal syndrome with creatinine > 3.  He was diuresed and required milrinone  for low output HF.  This was titrated off.  He was started on  amiodarone  in setting of atypical atrial flutter.  He was cardioverted back to NSR this admission.  TEE showed EF 20-25%, mild LV dilation, mild LVH, posterior mitral leaflet restriction with severe functional MR, MR ERO 0.6 cm^2 by PISA.   03/24/24:  He returns for post-hospital followup.  He is taking all his meds.  Weight has been stable, no edema.  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  Mild dyspnea with long walks or stairs.  No chest pain.  No orthopnea/PND.   He returns today for heart failure follow up. Overall feeling ***. NYHA ***. Reports {Symptoms; cardiac:12860::dyspnea,fatigue}. Denies {Symptoms; cardiac:12860::chest pain,dyspnea,fatigue,near-syncope,orthopnea,palpitations,dizziness,abnormal bleeding}. Able to perform ADLs. Appetite okay. Weight at home ***. BP at home***. Compliant with all medications.   PMH: 1. CKD stage 4 2. Hyperlipidemia 3. HTN 4. Atrial fibrillation/flutter: DCCV 7/25. 5. Prior ETOH/cocaine abuse 6. Chronic systolic CHF: Diagnosed in 2017. Nonischemic cardiomyopathy.  - LHC (2017): No CAD.  - Echo (2017): Severe LV dysfunction and severe LVH.  - LHC (6/23): No significant CAD.  - Cardiac MRI (6/23): LVEF 29%, LV severely dilated, asymmetric LVH with basal septum measuring 17 mm, RVEF 45%, patchy LGE in septum and RV insertion sites + subepicardial LGE in inferolateral wall. Scar pattern and asymmetric hypertrophy could be consistent with hypertrophic cardiomyopathy. However, due to subepicardial LGE in inferolateral wall and presence of mediastinal lymphadenopathy, cardiac PET recommended to rule out cardiac sarcoidosis. - Echo (6/24): EF 20-25% gHK, GIIDD, RV mod reduced function, mild MVR - Echo (7/25): EF 20-25%, moderate LVH, LV with GHK, RA severely reduced, LA/RA severely dilated, mod-severe MR, mild-mod  TR - TEE (7/25): EF 20-25%, mild LV dilation, mild LVH, posterior mitral leaflet restriction  with severe functional MR, MR ERO 0.6 cm^2 by PISA.  7. Mitral regurgitation: Severe functional MR by TEE in 7/25; posterior mitral valve leaflet restriction with MR ERO 0.6 cm^2 by PISA.  8. Type 2 diabetes  Family History  Problem Relation Age of Onset   Hypertension Father    Social History   Socioeconomic History   Marital status: Single    Spouse name: Not on file   Number of children: 3   Years of education: Not on file   Highest education level: 7th grade  Occupational History   Occupation: Education administrator    Comment: varies  Tobacco Use   Smoking status: Never   Smokeless tobacco: Never  Vaping Use   Vaping status: Never Used  Substance and Sexual Activity   Alcohol use: Yes    Alcohol/week: 30.0 standard drinks of alcohol    Types: 30 Cans of beer per week    Comment: occ   Drug use: Not Currently    Types: Crack cocaine, Cocaine    Comment: 2 weeks   Sexual activity: Not on file  Other Topics Concern   Not on file  Social History Narrative   Not on file   Social Drivers of Health   Financial Resource Strain: Medium Risk (06/05/2022)   Overall Financial Resource Strain (CARDIA)    Difficulty of Paying Living Expenses: Somewhat hard  Food Insecurity: Food Insecurity Present (03/10/2024)   Hunger Vital Sign    Worried About Running Out of Food in the Last Year: Sometimes true    Ran Out of Food in the Last Year: Sometimes true  Transportation Needs: No Transportation Needs (03/10/2024)   PRAPARE - Administrator, Civil Service (Medical): No    Lack of Transportation (Non-Medical): No  Physical Activity: Not on file  Stress: Not on file  Social Connections: Not on file  Intimate Partner Violence: Not At Risk (03/10/2024)   Humiliation, Afraid, Rape, and Kick questionnaire    Fear of Current or Ex-Partner: No    Emotionally Abused: No    Physically Abused: No    Sexually Abused: No   ROS: All systems reviewed and negative except as per HPI.    Current Outpatient Medications  Medication Sig Dispense Refill   amiodarone  (PACERONE ) 200 MG tablet Take 1 tablet (200 mg total) by mouth daily. 90 tablet 3   atorvastatin  (LIPITOR) 20 MG tablet Take 1 tablet (20 mg total) by mouth daily. 30 tablet 12   Dulaglutide  (TRULICITY ) 0.75 MG/0.5ML SOAJ Inject 0.75 mg into the skin once a week. 2 mL 1   empagliflozin  (JARDIANCE ) 10 MG TABS tablet Take 1 tablet (10 mg total) by mouth daily. 30 tablet 0   glimepiride  (AMARYL ) 2 MG tablet Take 2 mg by mouth daily. (Patient not taking: Reported on 03/24/2024)     hydrALAZINE  (APRESOLINE ) 100 MG tablet Take 1 tablet (100 mg total) by mouth with breakfast, with lunch, and with evening meal. 90 tablet 0   isosorbide  mononitrate (IMDUR ) 30 MG 24 hr tablet Take 3 tablets (90 mg total) by mouth daily. 90 tablet 0   metoprolol  succinate (TOPROL  XL) 50 MG 24 hr tablet Take 1 tablet (50 mg total) by mouth daily. Take with or immediately following a meal. 30 tablet 5   potassium chloride  SA (KLOR-CON  M) 20 MEQ tablet Take 1 tablet (20 mEq total) by mouth daily. 30  tablet 0   Rivaroxaban  (XARELTO ) 15 MG TABS tablet Take 1 tablet (15 mg total) by mouth daily with supper. 30 tablet 11   torsemide  (DEMADEX ) 20 MG tablet Take 4 tablets in the morning (80mg  total) and 2 tablets (40mg  total) in the evening. 180 tablet 0   No current facility-administered medications for this visit.   There were no vitals taken for this visit.  PHYSICAL EXAM: General: Well appearing. No distress on RA Cardiac: JVP ***. S1 and S2 present. No murmurs or rub. Resp: Lung sounds clear and equal B/L Abdomen: Soft, non-tender, non-distended.  Extremities: Warm and dry.  *** edema.  Neuro: Alert and oriented x3. Affect pleasant. Moves all extremities without difficulty.  ASSESSMENT/PLAN: 1. Chronic systolic CHF: Long history of nonischemic cardiomyopathy. Cath in 6/23 with no significant CAD, CI 1.99.  Substance abuse (cocaine and ETOH)  thought to play a role in cardiomyopathy, also uncontrolled HTN in the past.  However, cardiac MRI was done in 6/23 showing LVEF 29%, LV severely dilated, asymmetric LVH with basal septum measuring 17 mm, RVEF 45%, patchy LGE in septum and RV insertion sites + subepicardial LGE in inferolateral wall. Scar pattern and asymmetric hypertrophy could be consistent with hypertrophic cardiomyopathy. However, due to subepicardial LGE in inferolateral wall and presence of mediastinal lymphadenopathy, cardiac PET recommended to rule out cardiac sarcoidosis.  This was never done due to lack of insurance.  Echo in 7/25 showed EF 20-25% with moderate concentric LVH, severe RV dysfunction, probably severe functional MR with PISA ERO 0.47 cm^2, IVC dilated.  Low output HF noted at 7/25 admission, he was started on milrinone  and diuresed, milrinone  eventually discontinued.   - NYHA class II currently, not volume overloaded on exam. GDMT limited by CKD stage 4. - Continue hydralazine  100 mg tid and Imdur  90 mg daily.  - Continue torsemide  80 mg qam/40 qpm - Continue Jardiance  10 mg daily.  - Continue Toprol  XL 50 *** mg daily.  - Repeat echo in 3 months, reassess MR with medical management. *** - Would like to get a cardiac PET to rule out cardiac sarcoidosis, but this is probably not an option with no insurance. - Not a good candidate for advanced therapies with noncompliance, lack of insurance, RV dysfunction and CKD stage 4.  2. Atrial fibrillation/atypical flutter: Persistent atypical atrial flutter in 7/25, TEE-guided DCCV back to NSR and he remains in NSR today.   - Continue Xarelto  but decrease to 15 mg daily based on renal function.  - He can decrease amiodarone  to 200 mg daily. Check LFTs and TSH, needs regular eye exam.  - Eventual fibrillation/flutter ablation would be ideal.   3. CKD stage IV: Suspect cardiorenal syndrome + DM2 + HTN.  Last creatinine was 2.69.  - BMET today.  - Refer to nephrology.    4. Mitral regurgitation: Severe functional MR.  TEE 7/25 showed severe eccentric, posteriorly-directed MR with restricted posterior leaflet, MR ERO 0.6 cm^2 by PISA.  - Will need to follow closely with GDMT titration, if does not improve would favor mTEER but lack of insurance coverage will be an issue. Repeat echo in 3 months as above.  5. ETOH/cocaine abuse: Says he has quit.  6. DM2: Poor control.  Per PCP.   7. SDOH: He does not have insurance.  - He will need a social work consult.   Followup HF pharmacist in 2 wks for med titration, followup APP 1 month.  Follow up in *** with ***  [place]  Jama, NP 04/23/2024

## 2024-04-23 NOTE — Progress Notes (Incomplete)
 ADVANCED HF CLINIC NOTE PCP: Celestia Rosaline SQUIBB, Justin [name] HF Cardiology: Dr. Rolan  HPI: 43 y.o. spanish-speaking male with history of chronic systolic CHF, uncontrolled HTN, DM II, mitral regurgitation, ETOH abuse, hx cocaine use.   Heart failure dates back to 2017. Admitted to [facility] with acute systolic CHF and hypertensive urgency. Echo showed severe LV dsyfunction and severe LVH. Cath with no CAD.   Admitted in 6/23 with acute respiratory failure with hypoxia requiring BiPAP due to CHF. He was diuresed and initiated on GDMT. LHC showed no significant CAD.  Cardiac MRI in 6/23 showed LVEF 29%, LV severely dilated, asymmetric LVH with basal septum measuring 17 mm, RVEF 45%, patchy LGE in septum and RV insertion sites + subepicardial LGE in inferolateral wall. Scar pattern and asymmetric hypertrophy could be consistent with hypertrophic cardiomyopathy. However, due to subepicardial LGE in inferolateral wall and presence of mediastinal lymphadenopathy, cardiac PET recommended to rule out cardiac sarcoidosis.After discharge he no-showed TOC and cardiology clinic follow-ups.    Admitted 1/24 and 7/24 for CHF exacerbation and volume overload requiring IV diuresis due to noncompliance with medications. After last admission was restarted on Entresto , Lasix , Jardiance , Spiro and Metoprolol . He no showed to post hospital follow ups with [facility] and Cardiology.   Echo 6/24 showed EF 20-25% gHK, GIIDD, RV mod reduced, mild MVR, IVC dilated.   Admitted 12/24 with A/C HFrEF in the setting of noncompliance. Diuresed and GDMT restarted.    Admitted in 7/25 with CHF exacerbation.  He had been off cardiac meds. Echo this admit EF 20-25%, moderate LVH, LV with GHK, RA severely reduced, LA/RA severely dilated, mod-severe MR, mild-mod TR. Cardiorenal syndrome with creatinine > 3.  He was diuresed and required milrinone  for low output HF.  This was titrated off.  He was started on  amiodarone  in setting of atypical atrial flutter.  He was cardioverted back to NSR this admission.  TEE showed EF 20-25%, mild LV dilation, mild LVH, posterior mitral leaflet restriction with severe functional MR, MR ERO 0.6 cm^2 by PISA.   03/24/24:  He returns for post-hospital followup.  He is taking all his meds.  Weight has been stable, no edema.  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  Mild dyspnea with long walks or stairs.  No chest pain.  No orthopnea/PND.   He returns today for heart failure follow up. Overall feeling ***. NYHA ***. Reports {Symptoms; cardiac:12860::dyspnea,fatigue}. Denies {Symptoms; cardiac:12860::chest pain,dyspnea,fatigue,near-syncope,orthopnea,palpitations,dizziness,abnormal bleeding}. Able to perform ADLs. Appetite okay. Weight at home ***. BP at home***. Compliant with all medications.   PMH: 1. CKD stage 4 2. Hyperlipidemia 3. HTN 4. Atrial fibrillation/flutter: DCCV 7/25. 5. Prior ETOH/cocaine abuse 6. Chronic systolic CHF: Diagnosed in 2017. Nonischemic cardiomyopathy.  - LHC (2017): No CAD.  - Echo (2017): Severe LV dysfunction and severe LVH.  - LHC (6/23): No significant CAD.  - Cardiac MRI (6/23): LVEF 29%, LV severely dilated, asymmetric LVH with basal septum measuring 17 mm, RVEF 45%, patchy LGE in septum and RV insertion sites + subepicardial LGE in inferolateral wall. Scar pattern and asymmetric hypertrophy could be consistent with hypertrophic cardiomyopathy. However, due to subepicardial LGE in inferolateral wall and presence of mediastinal lymphadenopathy, cardiac PET recommended to rule out cardiac sarcoidosis. - Echo (6/24): EF 20-25% gHK, GIIDD, RV mod reduced function, mild MVR - Echo (7/25): EF 20-25%, moderate LVH, LV with GHK, RA severely reduced, LA/RA severely dilated, mod-severe MR, mild-mod  TR - TEE (7/25): EF 20-25%, mild LV dilation, mild LVH, posterior mitral leaflet restriction  with severe functional MR, MR ERO 0.6 cm^2 by PISA.  7. Mitral regurgitation: Severe functional MR by TEE in 7/25; posterior mitral valve leaflet restriction with MR ERO 0.6 cm^2 by PISA.  8. Type 2 diabetes  Family History  Problem Relation Age of Onset   Hypertension Father    Social History   Socioeconomic History   Marital status: Single    Spouse name: Not on file   Number of children: 3   Years of education: Not on file   Highest education level: 7th grade  Occupational History   Occupation: Education administrator    Comment: varies  Tobacco Use   Smoking status: Never   Smokeless tobacco: Never  Vaping Use   Vaping status: Never Used  Substance and Sexual Activity   Alcohol use: Yes    Alcohol/week: 30.0 standard drinks of alcohol    Types: 30 Cans of beer per week    Comment: occ   Drug use: Not Currently    Types: Crack cocaine, Cocaine    Comment: 2 weeks   Sexual activity: Not on file  Other Topics Concern   Not on file  Social History Narrative   Not on file   Social Drivers of Health   Financial Resource Strain: Medium Risk (06/05/2022)   Overall Financial Resource Strain (CARDIA)    Difficulty of Paying Living Expenses: Somewhat hard  Food Insecurity: Food Insecurity Present (03/10/2024)   Hunger Vital Sign    Worried About Running Out of Food in the Last Year: Sometimes true    Ran Out of Food in the Last Year: Sometimes true  Transportation Needs: No Transportation Needs (03/10/2024)   PRAPARE - Administrator, Civil Service (Medical): No    Lack of Transportation (Non-Medical): No  Physical Activity: Not on file  Stress: Not on file  Social Connections: Not on file  Intimate Partner Violence: Not At Risk (03/10/2024)   Humiliation, Afraid, Rape, and Kick questionnaire    Fear of Current or Ex-Partner: No    Emotionally Abused: No    Physically Abused: No    Sexually Abused: No   ROS: All systems reviewed and negative except as per HPI.    Current Outpatient Medications  Medication Sig Dispense Refill   amiodarone  (PACERONE ) 200 MG tablet Take 1 tablet (200 mg total) by mouth daily. 90 tablet 3   atorvastatin  (LIPITOR) 20 MG tablet Take 1 tablet (20 mg total) by mouth daily. 30 tablet 12   Dulaglutide  (TRULICITY ) 0.75 MG/0.5ML SOAJ Inject 0.75 mg into the skin once a week. 2 mL 1   empagliflozin  (JARDIANCE ) 10 MG TABS tablet Take 1 tablet (10 mg total) by mouth daily. 30 tablet 0   glimepiride  (AMARYL ) 2 MG tablet Take 2 mg by mouth daily. (Patient not taking: Reported on 03/24/2024)     hydrALAZINE  (APRESOLINE ) 100 MG tablet Take 1 tablet (100 mg total) by mouth with breakfast, with lunch, and with evening meal. 90 tablet 0   isosorbide  mononitrate (IMDUR ) 30 MG 24 hr tablet Take 3 tablets (90 mg total) by mouth daily. 90 tablet 0   metoprolol  succinate (TOPROL  XL) 50 MG 24 hr tablet Take 1 tablet (50 mg total) by mouth daily. Take with or immediately following a meal. 30 tablet 5   potassium chloride  SA (KLOR-CON  M) 20 MEQ tablet Take 1 tablet (20 mEq total) by mouth daily. 30  tablet 0   Rivaroxaban  (XARELTO ) 15 MG TABS tablet Take 1 tablet (15 mg total) by mouth daily with supper. 30 tablet 11   torsemide  (DEMADEX ) 20 MG tablet Take 4 tablets in the morning (80mg  total) and 2 tablets (40mg  total) in the evening. 180 tablet 0   No current facility-administered medications for this visit.   There were no vitals taken for this visit.  PHYSICAL EXAM: General: Well appearing. No distress on RA Cardiac: JVP ***. S1 and S2 present. No murmurs or rub. Resp: Lung sounds clear and equal B/L Abdomen: Soft, non-tender, non-distended.  Extremities: Warm and dry.  *** edema.  Neuro: Alert and oriented x3. Affect pleasant. Moves all extremities without difficulty.  ASSESSMENT/PLAN: 1. Chronic systolic CHF: Long history of nonischemic cardiomyopathy. Cath in 6/23 with no significant CAD, CI 1.99.  Substance abuse (cocaine and ETOH)  thought to play a role in cardiomyopathy, also uncontrolled HTN in the past.  However, cardiac MRI was done in 6/23 showing LVEF 29%, LV severely dilated, asymmetric LVH with basal septum measuring 17 mm, RVEF 45%, patchy LGE in septum and RV insertion sites + subepicardial LGE in inferolateral wall. Scar pattern and asymmetric hypertrophy could be consistent with hypertrophic cardiomyopathy. However, due to subepicardial LGE in inferolateral wall and presence of mediastinal lymphadenopathy, cardiac PET recommended to rule out cardiac sarcoidosis.  This was never done due to lack of insurance.  Echo in 7/25 showed EF 20-25% with moderate concentric LVH, severe RV dysfunction, probably severe functional MR with PISA ERO 0.47 cm^2, IVC dilated.  Low output HF noted at 7/25 admission, he was started on milrinone  and diuresed, milrinone  eventually discontinued.   - NYHA class II currently, not volume overloaded on exam. GDMT limited by CKD stage 4. - Continue hydralazine  100 mg tid and Imdur  90 mg daily.  - Continue torsemide  80 mg qam/40 qpm - Continue Jardiance  10 mg daily.  - Continue Toprol  XL 50 *** mg daily.  - Repeat echo in 3 months, reassess MR with medical management. *** - Would like to get a cardiac PET to rule out cardiac sarcoidosis, but this is probably not an option with no insurance. - Not a good candidate for advanced therapies with noncompliance, lack of insurance, RV dysfunction and CKD stage 4.  2. Atrial fibrillation/atypical flutter: Persistent atypical atrial flutter in 7/25, TEE-guided DCCV back to NSR and he remains in NSR today.   - Continue Xarelto  but decrease to 15 mg daily based on renal function.  - He can decrease amiodarone  to 200 mg daily. Check LFTs and TSH, needs regular eye exam.  - Eventual fibrillation/flutter ablation would be ideal.   3. CKD stage IV: Suspect cardiorenal syndrome + DM2 + HTN.  Last creatinine was 2.69.  - BMET today.  - Refer to nephrology.    4. Mitral regurgitation: Severe functional MR.  TEE 7/25 showed severe eccentric, posteriorly-directed MR with restricted posterior leaflet, MR ERO 0.6 cm^2 by PISA.  - Will need to follow closely with GDMT titration, if does not improve would favor mTEER but lack of insurance coverage will be an issue. Repeat echo in 3 months as above.  5. ETOH/cocaine abuse: Says he has quit.  6. DM2: Poor control.  Per PCP.   7. SDOH: He does not have insurance.  - He will need a social work consult.   Followup HF pharmacist in 2 wks for med titration, followup APP 1 month.  Follow up in *** with ***  Justin  Jama, Justin [name] 04/23/2024

## 2024-04-23 NOTE — Telephone Encounter (Signed)
 Called to confirm/remind patient of their appointment at the [facility] on 04/24/24.   Appointment:   [x] Confirmed  [] Left mess   [] No answer/No voice mail  [] VM Full/unable to leave message  [] Phone not in service  Patient reminded to bring all medications and/or complete list.  Confirmed patient has transportation. Gave directions, instructed to utilize valet parking.

## 2024-04-24 ENCOUNTER — Encounter (HOSPITAL_COMMUNITY): Payer: Self-pay

## 2024-04-24 ENCOUNTER — Encounter (HOSPITAL_COMMUNITY): Admission: RE | Admit: 2024-04-24 | Payer: MEDICAID | Source: Ambulatory Visit

## 2024-05-08 ENCOUNTER — Other Ambulatory Visit (HOSPITAL_COMMUNITY): Payer: Self-pay

## 2024-05-13 NOTE — Result Encounter Note (Signed)
 Pt is aware of results. Avp

## 2024-05-15 ENCOUNTER — Ambulatory Visit (HOSPITAL_COMMUNITY): Admission: RE | Admit: 2024-05-15 | Payer: MEDICAID | Source: Ambulatory Visit

## 2024-05-19 ENCOUNTER — Encounter (HOSPITAL_COMMUNITY): Payer: Self-pay

## 2024-06-27 ENCOUNTER — Ambulatory Visit (INDEPENDENT_AMBULATORY_CARE_PROVIDER_SITE_OTHER): Payer: Self-pay | Admitting: Primary Care

## 2024-08-10 ENCOUNTER — Emergency Department (HOSPITAL_COMMUNITY): Payer: MEDICAID

## 2024-08-10 ENCOUNTER — Other Ambulatory Visit: Payer: Self-pay

## 2024-08-10 ENCOUNTER — Encounter (HOSPITAL_COMMUNITY): Payer: Self-pay | Admitting: *Deleted

## 2024-08-10 ENCOUNTER — Inpatient Hospital Stay (HOSPITAL_COMMUNITY)
Admission: EM | Admit: 2024-08-10 | Discharge: 2024-08-20 | DRG: 291 | Disposition: A | Payer: MEDICAID | Attending: Family Medicine | Admitting: Family Medicine

## 2024-08-10 DIAGNOSIS — J189 Pneumonia, unspecified organism: Principal | ICD-10-CM | POA: Diagnosis present

## 2024-08-10 DIAGNOSIS — I5022 Chronic systolic (congestive) heart failure: Secondary | ICD-10-CM | POA: Diagnosis present

## 2024-08-10 DIAGNOSIS — R7989 Other specified abnormal findings of blood chemistry: Secondary | ICD-10-CM | POA: Diagnosis present

## 2024-08-10 DIAGNOSIS — I5023 Acute on chronic systolic (congestive) heart failure: Secondary | ICD-10-CM | POA: Diagnosis present

## 2024-08-10 DIAGNOSIS — N1832 Chronic kidney disease, stage 3b: Secondary | ICD-10-CM | POA: Diagnosis present

## 2024-08-10 DIAGNOSIS — E66813 Obesity, class 3: Secondary | ICD-10-CM

## 2024-08-10 DIAGNOSIS — E876 Hypokalemia: Secondary | ICD-10-CM | POA: Diagnosis present

## 2024-08-10 DIAGNOSIS — I10 Essential (primary) hypertension: Secondary | ICD-10-CM | POA: Diagnosis present

## 2024-08-10 DIAGNOSIS — E119 Type 2 diabetes mellitus without complications: Secondary | ICD-10-CM

## 2024-08-10 DIAGNOSIS — I509 Heart failure, unspecified: Secondary | ICD-10-CM

## 2024-08-10 DIAGNOSIS — I4892 Unspecified atrial flutter: Secondary | ICD-10-CM | POA: Diagnosis present

## 2024-08-10 DIAGNOSIS — E1169 Type 2 diabetes mellitus with other specified complication: Secondary | ICD-10-CM

## 2024-08-10 LAB — CBC
HCT: 50.5 % (ref 39.0–52.0)
Hemoglobin: 16.8 g/dL (ref 13.0–17.0)
MCH: 30 pg (ref 26.0–34.0)
MCHC: 33.3 g/dL (ref 30.0–36.0)
MCV: 90.2 fL (ref 80.0–100.0)
Platelets: 228 K/uL (ref 150–400)
RBC: 5.6 MIL/uL (ref 4.22–5.81)
RDW: 13.4 % (ref 11.5–15.5)
WBC: 11.5 K/uL — ABNORMAL HIGH (ref 4.0–10.5)
nRBC: 0 % (ref 0.0–0.2)

## 2024-08-10 LAB — BASIC METABOLIC PANEL WITH GFR
Anion gap: 13 (ref 5–15)
BUN: 27 mg/dL — ABNORMAL HIGH (ref 6–20)
CO2: 23 mmol/L (ref 22–32)
Calcium: 8.8 mg/dL — ABNORMAL LOW (ref 8.9–10.3)
Chloride: 96 mmol/L — ABNORMAL LOW (ref 98–111)
Creatinine, Ser: 2.06 mg/dL — ABNORMAL HIGH (ref 0.61–1.24)
GFR, Estimated: 40 mL/min — ABNORMAL LOW (ref >60)
Glucose, Bld: 416 mg/dL — ABNORMAL HIGH (ref 70–99)
Potassium: 3.3 mmol/L — ABNORMAL LOW (ref 3.5–5.1)
Sodium: 132 mmol/L — ABNORMAL LOW (ref 135–145)

## 2024-08-10 LAB — BRAIN NATRIURETIC PEPTIDE: B Natriuretic Peptide: 193.5 pg/mL — ABNORMAL HIGH (ref 0.0–100.0)

## 2024-08-10 LAB — RESP PANEL BY RT-PCR (RSV, FLU A&B, COVID)  RVPGX2
Influenza A by PCR: NEGATIVE
Influenza B by PCR: NEGATIVE
Resp Syncytial Virus by PCR: NEGATIVE
SARS Coronavirus 2 by RT PCR: NEGATIVE

## 2024-08-10 LAB — TROPONIN I (HIGH SENSITIVITY): Troponin I (High Sensitivity): 84 ng/L — ABNORMAL HIGH (ref <18)

## 2024-08-10 LAB — GROUP A STREP BY PCR: Group A Strep by PCR: NOT DETECTED

## 2024-08-10 MED ORDER — SODIUM CHLORIDE 0.9 % IV SOLN
500.0000 mg | Freq: Once | INTRAVENOUS | Status: DC
  Filled 2024-08-10: qty 5

## 2024-08-10 MED ORDER — SODIUM CHLORIDE 0.9 % IV SOLN
1.0000 g | Freq: Once | INTRAVENOUS | Status: AC
  Administered 2024-08-10: 1 g via INTRAVENOUS
  Filled 2024-08-10: qty 10

## 2024-08-10 MED ORDER — METOPROLOL TARTRATE 5 MG/5ML IV SOLN
5.0000 mg | INTRAVENOUS | Status: DC | PRN
  Administered 2024-08-10 – 2024-08-11 (×2): 5 mg via INTRAVENOUS
  Filled 2024-08-10 (×2): qty 5

## 2024-08-10 MED ORDER — SODIUM CHLORIDE 0.9 % IV SOLN
100.0000 mg | Freq: Two times a day (BID) | INTRAVENOUS | Status: DC
  Administered 2024-08-11 (×3): 100 mg via INTRAVENOUS
  Filled 2024-08-10 (×4): qty 100

## 2024-08-10 MED ORDER — FUROSEMIDE 10 MG/ML IJ SOLN
40.0000 mg | Freq: Once | INTRAMUSCULAR | Status: AC
  Administered 2024-08-10: 40 mg via INTRAVENOUS
  Filled 2024-08-10: qty 4

## 2024-08-10 NOTE — ED Triage Notes (Signed)
 First Nurse Note: Pt presents with cough and sore throat.

## 2024-08-10 NOTE — ED Provider Notes (Signed)
 [place] EMERGENCY DEPARTMENT AT [facility] Provider Note   CSN: [id number] Arrival date & time: 08/10/24  2058     Patient presents with: Cough   Justin [name] is a 43 y.o. male.   The history is provided by the patient and medical records.  Cough  43 year old male with history of hypertension, CHF, obesity, hyperlipidemia, diabetes, chronic hypoxic respiratory failure on 2 L O2 PRN, presenting to the ED for SOB.  States he has been feeling poorly for several days now, mostly having cough and sore throat but has noticed some increased difficulty breathing as well.  States his cousin has been sick recently with similar and was around him during the holidays.  He denies any fever.  Cough has been intermittently productive with some mucus but denies any hemoptysis.  No fever or chills.  Increased LE swelling from baseline.  States he did have pneumonia about a year ago which felt similar.  He reports he has been taking his medications as directed.  Hypoxic on arrival to 89% on room air.  Prior to Admission medications   Medication Sig Start Date End Date Taking? Authorizing Provider  amiodarone  (PACERONE ) 200 MG tablet Take 1 tablet (200 mg total) by mouth daily. 03/24/24   Rolan Ezra RAMAN, MD  atorvastatin  (LIPITOR) 20 MG tablet Take 1 tablet (20 mg total) by mouth daily. 03/14/24   Lee, Jordan, NP  Dulaglutide  (TRULICITY ) 0.75 MG/0.5ML SOAJ Inject 0.75 mg into the skin once a week. 03/27/24   Celestia Rosaline SQUIBB, NP  empagliflozin  (JARDIANCE ) 10 MG TABS tablet Take 1 tablet (10 mg total) by mouth daily. 03/19/24   Smucker, Melvenia, MD  glimepiride  (AMARYL ) 2 MG tablet Take 2 mg by mouth daily. Patient not taking: Reported on 03/24/2024 03/06/24   [provider]  hydrALAZINE  (APRESOLINE ) 100 MG tablet Take 1 tablet (100 mg total) by mouth with breakfast, with lunch, and with evening meal. 03/19/24     isosorbide  mononitrate (IMDUR ) 30 MG 24 hr tablet Take  3 tablets (90 mg total) by mouth daily. 03/19/24     metoprolol  succinate (TOPROL  XL) 50 MG 24 hr tablet Take 1 tablet (50 mg total) by mouth daily. Take with or immediately following a meal. 04/10/24   Rolan Ezra RAMAN, MD  potassium chloride  SA (KLOR-CON  M) 20 MEQ tablet Take 1 tablet (20 mEq total) by mouth daily. 03/20/24     Rivaroxaban  (XARELTO ) 15 MG TABS tablet Take 1 tablet (15 mg total) by mouth daily with supper. 03/25/24   Rolan Ezra RAMAN, MD  torsemide  (DEMADEX ) 20 MG tablet Take 4 tablets in the morning (80mg  total) and 2 tablets (40mg  total) in the evening. 03/19/24       Allergies: Patient has no known allergies.    Review of Systems  Respiratory:  Positive for cough.   All other systems reviewed and are negative.   Updated Vital Signs BP (!) 159/95   Pulse (!) 124   Temp 99.2 F (37.3 C)   Resp 20   Ht 5' 6 (1.676 m)   Wt 108 kg   SpO2 93%   BMI 38.43 kg/m   Physical Exam Vitals and nursing note reviewed.  Constitutional:      Appearance: He is well-developed.  HENT:     Head: Normocephalic and atraumatic.  Eyes:     Conjunctiva/sclera: Conjunctivae normal.     Pupils: Pupils are equal, round, and reactive to light.  Cardiovascular:  Rate and Rhythm: Normal rate and regular rhythm.     Heart sounds: Normal heart sounds.  Pulmonary:     Effort: Pulmonary effort is normal. No respiratory distress.     Breath sounds: Rales present. No rhonchi.     Comments: 2L O2 in use, sats 98%, not distressed, able to speak with interpreter in sentences, rales at bases Abdominal:     General: Bowel sounds are normal.     Palpations: Abdomen is soft.     Tenderness: There is no guarding.     Hernia: No hernia is present.  Musculoskeletal:        General: Normal range of motion.     Cervical back: Normal range of motion.     Comments: 2+ pitting edema of bilateral ankles, grossly symmetric  Skin:    General: Skin is warm and dry.  Neurological:     Mental Status: He  is alert and oriented to person, place, and time.     (all labs ordered are listed, but only abnormal results are displayed) Labs Reviewed  BASIC METABOLIC PANEL WITH GFR - Abnormal; Notable for the following components:      Result Value   Sodium 132 (*)    Potassium 3.3 (*)    Chloride 96 (*)    Glucose, Bld 416 (*)    BUN 27 (*)    Creatinine, Ser 2.06 (*)    Calcium  8.8 (*)    GFR, Estimated 40 (*)    All other components within normal limits  CBC - Abnormal; Notable for the following components:   WBC 11.5 (*)    All other components within normal limits  BRAIN NATRIURETIC PEPTIDE - Abnormal; Notable for the following components:   B Natriuretic Peptide 193.5 (*)    All other components within normal limits  TROPONIN I (HIGH SENSITIVITY) - Abnormal; Notable for the following components:   Troponin I (High Sensitivity) 84 (*)    All other components within normal limits  RESP PANEL BY RT-PCR (RSV, FLU A&B, COVID)  RVPGX2  GROUP A STREP BY PCR  PROCALCITONIN  TROPONIN I (HIGH SENSITIVITY)    EKG: None  Radiology: DG Chest 2 View Result Date: 08/10/2024 CLINICAL DATA:  Difficulty breathing EXAM: CHEST - 2 VIEW COMPARISON:  Chest x-ray 03/09/2024.  Chest CT 02/17/2022. FINDINGS: The heart is enlarged. There central pulmonary vascular congestion. There are patchy airspace opacities in the infrahilar regions bilaterally. Costophrenic angles are clear. No pleural effusion or pneumothorax. No acute fractures are seen. IMPRESSION: 1. Cardiomegaly with central pulmonary vascular congestion. 2. Patchy airspace opacities in the infrahilar regions bilaterally may represent edema or infection. Electronically Signed   By: Greig Pique M.D.   On: 08/10/2024 22:20     Procedures   CRITICAL CARE Performed by: Olam CHRISTELLA Slocumb   Total critical care time: 45 minutes  Critical care time was exclusive of separately billable procedures and treating other patients.  Critical care was  necessary to treat or prevent imminent or life-threatening deterioration.  Critical care was time spent personally by me on the following activities: development of treatment plan with patient and/or surrogate as well as nursing, discussions with consultants, evaluation of patient's response to treatment, examination of patient, obtaining history from patient or surrogate, ordering and performing treatments and interventions, ordering and review of laboratory studies, ordering and review of radiographic studies, pulse oximetry and re-evaluation of patient's condition.   Medications Ordered in the ED  cefTRIAXone  (ROCEPHIN ) 1 g in  sodium chloride  0.9 % 100 mL IVPB (1 g Intravenous New Bag/Given 08/10/24 2332)  doxycycline  (VIBRAMYCIN ) 100 mg in sodium chloride  0.9 % 250 mL IVPB (has no administration in time range)  furosemide  (LASIX ) injection 40 mg (40 mg Intravenous Given 08/10/24 2319)                                    Medical Decision Making Amount and/or Complexity of Data Reviewed Labs: ordered. Radiology: ordered and independent interpretation performed. ECG/medicine tests: ordered and independent interpretation performed.  Risk Prescription drug management. Decision regarding hospitalization.   43 y.o. M here with SOB and sore throat.  States he was around a cousin during the holidays that was sick with similar.  Also has some increased lower extremity edema.  Was notably hypoxic on arrival to 89%, he does use 2 L PRN at home.  He is afebrile and nontoxic in appearance here.  Currently on 2 L and sats are well into the 90s.  He is in no acute distress and able speak with interpreter in full sentences.  Does have 2+ pitting edema of the bilateral ankles which is grossly symmetric.  Labs as above--mild leukocytosis.  Has some minor electrolyte disturbances, creatinine 2.06 which is around baseline compared with prior.  Troponin 84 but he is not having any active chest pain.  BNP  193.  Suspect elevated troponin in the setting of some demand ischemia and CKD.  Lower suspicion for ACS given no recent chest pain.  Chest x-ray with some vascular congestion but also findings of pneumonia.  Suspect his symptoms today are multifactorial.  Will start on course of IV antibiotics, given dose of Lasix .  Discussed with hospitalist, Dr. Charlton-- will admit for ongoing care.  Final diagnoses:  Community acquired pneumonia, unspecified laterality  Acute on chronic congestive heart failure, unspecified heart failure type [facility])    ED Discharge Orders     None          Jarold Olam HERO, PA-C 08/10/24 2338    Jerrol Agent, MD 08/10/24 2355

## 2024-08-11 ENCOUNTER — Other Ambulatory Visit: Payer: Self-pay

## 2024-08-11 ENCOUNTER — Telehealth (INDEPENDENT_AMBULATORY_CARE_PROVIDER_SITE_OTHER): Payer: Self-pay

## 2024-08-11 DIAGNOSIS — I509 Heart failure, unspecified: Secondary | ICD-10-CM

## 2024-08-11 LAB — CBC
HCT: 48.8 % (ref 39.0–52.0)
Hemoglobin: 16.4 g/dL (ref 13.0–17.0)
MCH: 30.3 pg (ref 26.0–34.0)
MCHC: 33.6 g/dL (ref 30.0–36.0)
MCV: 90 fL (ref 80.0–100.0)
Platelets: 212 K/uL (ref 150–400)
RBC: 5.42 MIL/uL (ref 4.22–5.81)
RDW: 13.7 % (ref 11.5–15.5)
WBC: 11.1 K/uL — ABNORMAL HIGH (ref 4.0–10.5)
nRBC: 0 % (ref 0.0–0.2)

## 2024-08-11 LAB — BASIC METABOLIC PANEL WITH GFR
Anion gap: 13 (ref 5–15)
BUN: 25 mg/dL — ABNORMAL HIGH (ref 6–20)
CO2: 23 mmol/L (ref 22–32)
Calcium: 8.1 mg/dL — ABNORMAL LOW (ref 8.9–10.3)
Chloride: 95 mmol/L — ABNORMAL LOW (ref 98–111)
Creatinine, Ser: 2.09 mg/dL — ABNORMAL HIGH (ref 0.61–1.24)
GFR, Estimated: 39 mL/min — ABNORMAL LOW (ref >60)
Glucose, Bld: 373 mg/dL — ABNORMAL HIGH (ref 70–99)
Potassium: 3.7 mmol/L (ref 3.5–5.1)
Sodium: 131 mmol/L — ABNORMAL LOW (ref 135–145)

## 2024-08-11 LAB — HEPATIC FUNCTION PANEL
ALT: 22 U/L (ref 0–44)
AST: 25 U/L (ref 15–41)
Albumin: 2.9 g/dL — ABNORMAL LOW (ref 3.5–5.0)
Alkaline Phosphatase: 88 U/L (ref 38–126)
Bilirubin, Direct: 0.1 mg/dL (ref 0.0–0.2)
Indirect Bilirubin: 0.5 mg/dL (ref 0.3–0.9)
Total Bilirubin: 0.6 mg/dL (ref 0.0–1.2)
Total Protein: 7 g/dL (ref 6.5–8.1)

## 2024-08-11 LAB — RESPIRATORY PANEL BY PCR

## 2024-08-11 LAB — COOXEMETRY PANEL
Carboxyhemoglobin: 1.3 % (ref 0.5–1.5)
Methemoglobin: 1.4 % (ref 0.0–1.5)
O2 Saturation: 47.8 %
Total hemoglobin: 17.9 g/dL — ABNORMAL HIGH (ref 12.0–16.0)

## 2024-08-11 LAB — LACTIC ACID, PLASMA: Lactic Acid, Venous: 1.8 mmol/L (ref 0.5–1.9)

## 2024-08-11 LAB — RAPID URINE DRUG SCREEN, HOSP PERFORMED
Amphetamines: NOT DETECTED
Barbiturates: NOT DETECTED
Benzodiazepines: NOT DETECTED
Cocaine: NOT DETECTED
Opiates: NOT DETECTED
Tetrahydrocannabinol: NOT DETECTED

## 2024-08-11 LAB — MAGNESIUM: Magnesium: 1.7 mg/dL (ref 1.7–2.4)

## 2024-08-11 LAB — CBG MONITORING, ED: Glucose-Capillary: 370 mg/dL — ABNORMAL HIGH (ref 70–99)

## 2024-08-11 LAB — GLUCOSE, CAPILLARY
Glucose-Capillary: 362 mg/dL — ABNORMAL HIGH (ref 70–99)
Glucose-Capillary: 354 mg/dL — ABNORMAL HIGH (ref 70–99)
Glucose-Capillary: 332 mg/dL — ABNORMAL HIGH (ref 70–99)
Glucose-Capillary: 320 mg/dL — ABNORMAL HIGH (ref 70–99)
Glucose-Capillary: 250 mg/dL — ABNORMAL HIGH (ref 70–99)

## 2024-08-11 LAB — PROCALCITONIN: Procalcitonin: 0.1 ng/mL

## 2024-08-11 LAB — TROPONIN I (HIGH SENSITIVITY): Troponin I (High Sensitivity): 81 ng/L — ABNORMAL HIGH (ref <18)

## 2024-08-11 MED ORDER — ISOSORBIDE MONONITRATE ER 60 MG PO TB24
90.0000 mg | ORAL_TABLET | Freq: Every day | ORAL | Status: DC
  Administered 2024-08-11 – 2024-08-17 (×7): 90 mg via ORAL
  Filled 2024-08-11 (×8): qty 1

## 2024-08-11 MED ORDER — SODIUM CHLORIDE 0.9% FLUSH: 10.0000 mL | INTRAVENOUS | Status: DC | PRN

## 2024-08-11 MED ORDER — METOPROLOL TARTRATE 25 MG PO TABS: 25.0000 mg | ORAL_TABLET | Freq: Four times a day (QID) | ORAL | Status: DC

## 2024-08-11 MED ORDER — CHLORHEXIDINE GLUCONATE CLOTH 2 % EX PADS
6.0000 | MEDICATED_PAD | Freq: Every day | CUTANEOUS | Status: DC
  Administered 2024-08-11 – 2024-08-20 (×9): 6 via TOPICAL

## 2024-08-11 MED ORDER — POTASSIUM CHLORIDE CRYS ER 20 MEQ PO TBCR
20.0000 meq | EXTENDED_RELEASE_TABLET | Freq: Every day | ORAL | Status: DC
  Administered 2024-08-11 – 2024-08-14 (×4): 20 meq via ORAL
  Filled 2024-08-11 (×4): qty 1

## 2024-08-11 MED ORDER — PROCHLORPERAZINE EDISYLATE 10 MG/2ML IJ SOLN
5.0000 mg | Freq: Four times a day (QID) | INTRAMUSCULAR | Status: DC | PRN
  Administered 2024-08-12: 5 mg via INTRAVENOUS
  Filled 2024-08-11: qty 2

## 2024-08-11 MED ORDER — AMIODARONE HCL 200 MG PO TABS
200.0000 mg | ORAL_TABLET | Freq: Every day | ORAL | Status: DC
  Administered 2024-08-11 – 2024-08-12 (×2): 200 mg via ORAL
  Filled 2024-08-11 (×2): qty 1

## 2024-08-11 MED ORDER — ACETAMINOPHEN 650 MG RE SUPP: 650.0000 mg | Freq: Four times a day (QID) | RECTAL | Status: DC | PRN

## 2024-08-11 MED ORDER — INSULIN ASPART 100 UNIT/ML IJ SOLN
0.0000 [IU] | Freq: Three times a day (TID) | INTRAMUSCULAR | Status: DC
  Administered 2024-08-11 (×2): 5 [IU] via SUBCUTANEOUS
  Filled 2024-08-11 (×2): qty 5

## 2024-08-11 MED ORDER — INSULIN ASPART 100 UNIT/ML IJ SOLN
0.0000 [IU] | Freq: Three times a day (TID) | INTRAMUSCULAR | Status: DC
  Administered 2024-08-11: 11 [IU] via SUBCUTANEOUS
  Administered 2024-08-12 (×2): 8 [IU] via SUBCUTANEOUS
  Administered 2024-08-12: 11 [IU] via SUBCUTANEOUS
  Administered 2024-08-13: 3 [IU] via SUBCUTANEOUS
  Administered 2024-08-13: 5 [IU] via SUBCUTANEOUS
  Administered 2024-08-13: 8 [IU] via SUBCUTANEOUS
  Administered 2024-08-14: 5 [IU] via SUBCUTANEOUS
  Administered 2024-08-14: 3 [IU] via SUBCUTANEOUS
  Administered 2024-08-14: 8 [IU] via SUBCUTANEOUS
  Administered 2024-08-15 (×2): 3 [IU] via SUBCUTANEOUS
  Administered 2024-08-15: 5 [IU] via SUBCUTANEOUS
  Administered 2024-08-16 (×2): 3 [IU] via SUBCUTANEOUS
  Administered 2024-08-16: 8 [IU] via SUBCUTANEOUS
  Administered 2024-08-17 (×3): 3 [IU] via SUBCUTANEOUS
  Administered 2024-08-18 (×3): 2 [IU] via SUBCUTANEOUS
  Administered 2024-08-19: 5 [IU] via SUBCUTANEOUS
  Administered 2024-08-20: 2 [IU] via SUBCUTANEOUS
  Filled 2024-08-11: qty 2
  Filled 2024-08-11: qty 1
  Filled 2024-08-11: qty 5
  Filled 2024-08-11 (×2): qty 3
  Filled 2024-08-11: qty 8
  Filled 2024-08-11 (×2): qty 3
  Filled 2024-08-11: qty 5
  Filled 2024-08-11: qty 3
  Filled 2024-08-11: qty 1
  Filled 2024-08-11: qty 5
  Filled 2024-08-11: qty 11
  Filled 2024-08-11: qty 1
  Filled 2024-08-11: qty 3
  Filled 2024-08-11: qty 1
  Filled 2024-08-11: qty 3
  Filled 2024-08-11: qty 5
  Filled 2024-08-11: qty 2
  Filled 2024-08-11: qty 5
  Filled 2024-08-11: qty 3
  Filled 2024-08-11: qty 8
  Filled 2024-08-11: qty 3
  Filled 2024-08-11: qty 11
  Filled 2024-08-11: qty 8

## 2024-08-11 MED ORDER — INSULIN GLARGINE-YFGN 100 UNIT/ML ~~LOC~~ SOLN
10.0000 [IU] | Freq: Every day | SUBCUTANEOUS | Status: DC
  Administered 2024-08-11: 10 [IU] via SUBCUTANEOUS
  Filled 2024-08-11: qty 0.1

## 2024-08-11 MED ORDER — GUAIFENESIN 100 MG/5ML PO LIQD
5.0000 mL | ORAL | Status: DC | PRN
  Administered 2024-08-11 – 2024-08-19 (×8): 5 mL via ORAL
  Filled 2024-08-11 (×8): qty 10

## 2024-08-11 MED ORDER — ORAL CARE MOUTH RINSE: 15.0000 mL | OROMUCOSAL | Status: DC | PRN

## 2024-08-11 MED ORDER — SENNA 8.6 MG PO TABS
1.0000 | ORAL_TABLET | Freq: Every day | ORAL | Status: DC | PRN
  Administered 2024-08-12: 8.6 mg via ORAL
  Filled 2024-08-11: qty 1

## 2024-08-11 MED ORDER — POTASSIUM CHLORIDE CRYS ER 20 MEQ PO TBCR
40.0000 meq | EXTENDED_RELEASE_TABLET | Freq: Once | ORAL | Status: AC
  Administered 2024-08-11: 40 meq via ORAL
  Filled 2024-08-11: qty 2

## 2024-08-11 MED ORDER — INSULIN GLARGINE-YFGN 100 UNIT/ML ~~LOC~~ SOLN
10.0000 [IU] | Freq: Once | SUBCUTANEOUS | Status: AC
  Administered 2024-08-11: 10 [IU] via SUBCUTANEOUS
  Filled 2024-08-11: qty 0.1

## 2024-08-11 MED ORDER — OXYCODONE HCL 5 MG PO TABS
5.0000 mg | ORAL_TABLET | ORAL | Status: DC | PRN
  Administered 2024-08-11 (×3): 5 mg via ORAL
  Filled 2024-08-11 (×3): qty 1
  Filled 2024-08-11: qty 2

## 2024-08-11 MED ORDER — FUROSEMIDE 10 MG/ML IJ SOLN
80.0000 mg | Freq: Two times a day (BID) | INTRAMUSCULAR | Status: DC
  Administered 2024-08-11 – 2024-08-13 (×5): 80 mg via INTRAVENOUS
  Filled 2024-08-11 (×5): qty 8

## 2024-08-11 MED ORDER — ACETAMINOPHEN 325 MG PO TABS: 650.0000 mg | ORAL_TABLET | Freq: Four times a day (QID) | ORAL | Status: DC | PRN

## 2024-08-11 MED ORDER — RIVAROXABAN 20 MG PO TABS
20.0000 mg | ORAL_TABLET | Freq: Every day | ORAL | Status: DC
  Administered 2024-08-11 – 2024-08-19 (×9): 20 mg via ORAL
  Filled 2024-08-11 (×9): qty 1

## 2024-08-11 MED ORDER — MAGNESIUM SULFATE 2 GM/50ML IV SOLN
2.0000 g | Freq: Once | INTRAVENOUS | Status: AC
  Administered 2024-08-11: 2 g via INTRAVENOUS
  Filled 2024-08-11: qty 50

## 2024-08-11 MED ORDER — ATORVASTATIN CALCIUM 10 MG PO TABS
20.0000 mg | ORAL_TABLET | Freq: Every day | ORAL | Status: DC
  Administered 2024-08-11 – 2024-08-20 (×10): 20 mg via ORAL
  Filled 2024-08-11 (×10): qty 2

## 2024-08-11 MED ORDER — INSULIN ASPART 100 UNIT/ML IJ SOLN
0.0000 [IU] | Freq: Every day | INTRAMUSCULAR | Status: DC
  Administered 2024-08-11: 5 [IU] via SUBCUTANEOUS
  Filled 2024-08-11: qty 5

## 2024-08-11 MED ORDER — INSULIN ASPART 100 UNIT/ML IJ SOLN
0.0000 [IU] | Freq: Every day | INTRAMUSCULAR | Status: DC
  Administered 2024-08-11: 2 [IU] via SUBCUTANEOUS
  Administered 2024-08-12: 4 [IU] via SUBCUTANEOUS
  Administered 2024-08-13 – 2024-08-14 (×2): 2 [IU] via SUBCUTANEOUS
  Administered 2024-08-15: 3 [IU] via SUBCUTANEOUS
  Administered 2024-08-16 – 2024-08-18 (×3): 2 [IU] via SUBCUTANEOUS
  Administered 2024-08-19: 5 [IU] via SUBCUTANEOUS
  Filled 2024-08-11: qty 3
  Filled 2024-08-11 (×2): qty 2
  Filled 2024-08-11: qty 4
  Filled 2024-08-11: qty 1
  Filled 2024-08-11 (×3): qty 2

## 2024-08-11 MED ORDER — INSULIN GLARGINE-YFGN 100 UNIT/ML ~~LOC~~ SOLN
20.0000 [IU] | Freq: Every day | SUBCUTANEOUS | Status: DC
  Administered 2024-08-12 – 2024-08-13 (×2): 20 [IU] via SUBCUTANEOUS
  Filled 2024-08-11 (×4): qty 0.2

## 2024-08-11 MED ORDER — SODIUM CHLORIDE 0.9% FLUSH
3.0000 mL | Freq: Two times a day (BID) | INTRAVENOUS | Status: DC
  Administered 2024-08-11 – 2024-08-20 (×16): 3 mL via INTRAVENOUS

## 2024-08-11 MED ORDER — TORSEMIDE 20 MG PO TABS: 80.0000 mg | ORAL_TABLET | Freq: Every day | ORAL | Status: DC

## 2024-08-11 MED ORDER — SODIUM CHLORIDE 0.9 % IV SOLN
2.0000 g | INTRAVENOUS | Status: DC
  Administered 2024-08-11 – 2024-08-12 (×2): 2 g via INTRAVENOUS
  Filled 2024-08-11 (×2): qty 20

## 2024-08-11 MED ORDER — TORSEMIDE 20 MG PO TABS: 40.0000 mg | ORAL_TABLET | Freq: Every evening | ORAL | Status: DC

## 2024-08-11 MED ORDER — METOPROLOL SUCCINATE ER 50 MG PO TB24
50.0000 mg | ORAL_TABLET | Freq: Every day | ORAL | Status: DC
  Administered 2024-08-11: 50 mg via ORAL
  Filled 2024-08-11: qty 1

## 2024-08-11 MED ORDER — DOBUTAMINE-DEXTROSE 4-5 MG/ML-% IV SOLN
2.5000 ug/kg/min | INTRAVENOUS | Status: DC
  Administered 2024-08-11 – 2024-08-13 (×2): 2.5 ug/kg/min via INTRAVENOUS
  Administered 2024-08-17: 2 ug/kg/min via INTRAVENOUS
  Filled 2024-08-11 (×3): qty 250

## 2024-08-11 NOTE — Inpatient Diabetes Management (Signed)
 Inpatient Diabetes Program Recommendations  AACE/ADA: New Consensus Statement on Inpatient Glycemic Control (2015)  Target Ranges:  Prepandial:   less than 140 mg/dL      Peak postprandial:   less than 180 mg/dL (1-2 hours)      Critically ill patients:  140 - 180 mg/dL   Lab Results  Component Value Date   GLUCAP 362 (H) 08/11/2024   HGBA1C 10.3 (H) 03/10/2024    Review of Glycemic Control  Latest Reference Range & Units 08/11/24 00:40 08/11/24 08:06  Glucose-Capillary 70 - 99 mg/dL 629 (H) 637 (H)   Diabetes history: DM 2 Outpatient Diabetes medications:  Trulicity  0.75 mg weekly Jardiance  10 mg daily Current orders for Inpatient glycemic control:  Semglee  10 units daily Novolog  0-6 units tid with meals and HS  Inpatient Diabetes Program Recommendations:    Note that patient was taking both Trulicity  and Jardiance  at his last MD appt. On 05/28/24 and A1C was 7.9% per note.  A1C is pending.  Unclear if patient was taking his GLP-1 prior to admit.   -Please consider increasing Semglee  to 20 units daily and increase Novolog  correction to moderate (0-15 units) tid with meals and HS.   Thanks,   Randall Bullocks, RN, BC-ADM Inpatient Diabetes Coordinator Pager [phone]  (8a-5p)

## 2024-08-11 NOTE — Discharge Instructions (Signed)
 Recursos de transporte: Transporte gestionado de Medicare:  Si tienes un plan gestionado de Medicare (UHC/BCBS/Humana, etc.), por favor llama a Servicios Sociales que aparece en el reverso de tu tarjeta de seguro para evaluar posibles beneficios de viajes no urgentes.   Transporte de Medicaid: Basado en el condado de residencia. Si tienes Medicaid, puedes programar el transporte para las citas mdicas usando los siguientes nmeros segn qu seguro gestiona tu plan de [place]: United[employer]care - [phone] [place]- [phone] [employer]- [phone] [facility] Complete Health- [phone] [facility]- [phone] Si tienes Medicaid secundario o no gestionado (Medicaid of [place]), llama a tu Departamento de [employer] local para hablar sobre transporte  Accede a [place] (anteriormente SCAT): [place]hellen [place] #: [phone] Se requiere evaluacin de elegibilidad y solicitud. Para personas con discapacidad: accesible en silla de ruedas, tarifa en efectivo de 4 dlares para el viaje ida y vuelta  Ruedas de ltimo curso: [employer], [place] y el condado de [place]  [place] #: [place] y [phone] [place] #: [place] mort poor resto del [place] [phone] Proporciona traslados a citas mdicas en el condado de [place], segn la disponibilidad de conductores. Debe tener 55 aos o ms y no poder science writer, lmite a un viaje por semana  Acceso [place]: [place] #: [phone] Se requiere solicitud. Ofrece viajes para mayores de 70 aos, as como para dealer con discapacidad en [employer] Funciona de lunes a viernes de 5:45 a 18:30, sbado de 8:45 a 17:15, cuesta 2,50 $ por viaje de ida     Servicios de [place] y [place] del [place] de [place] (TAMS): Sophronia everitt [place]# [phone]  Se requiere solicitud. Est disponible para menores de 90 aos y sin Medicaid, pero puede incluir un cargo  [place] -  Tarjeta de identificacin con descuento: [place] #: [phone]  Tarifas a mitad de precio si los pasajeros cumplen una o ms de las siguientes condiciones: son veteranos, financial planner (60 aos o ms), estudiantes (6-18 aos), reciben Medicaid/Medicare o tienen una discapacidad. Se requiere documentacin de cualquiera de estas para recibir una tarjeta de recepcin, que debe mostrarse al programmer, multimedia en el embarque cada vez.   ACTABETHA Sophronia everitt [place] #: [phone]  Cualquier persona que necesite transporte en el condado de [place] puede viajar en las furgonetas de la ACTA. ACTA proporciona transporte para viajes de uso general, viajes mdicos y casi cualquier destino de viaje no urgente. Adems, hay programas y precios especiales disponibles para los jinetes cualificados segn los requisitos de elegibilidad.  RCATS: Condado de [place] #: [phone]  Atiende a personas discapacitadas sin Medicaid, pero puede tener una pequea tarifa  RCATS: Condado de [place] #: [phone]  Atiende a personas discapacitadas sin Medicaid, aunque puede tener una pequea tarifa. Proporcionar transporte a Administrator limitados y por 10 dlares ida y vuelta.       Contar carbohidratos y la diabetes  Por qu es importante el conteo de carbohidratos?  ? Contar las porciones de carbohidratos ayuda a sales executive nivel de glucosa (azcar) en su sangre para que se sienta mejor.  ? El equilibrio the kroger carbohidratos que come y dietitian determina el nivel de glucosa que tendr en la sangre despus de comer.  ? Contar carbohidratos tambin le ayudar a planificar sus comidas.   Qu alimentos contienen carbohidratos?  Entre los alimentos con carbohidratos se incluyen:  ? Panes, galletas saladas y cereales  ? Pastas, arroz y granos  ? Vegetales (verduras) con  almidn, como papas, elote (maz o choclo) y chcharos (guisantes o arvejas)  ? Frijoles  (habichuelas) y legumbres  ? Leche, leche de soya y yogur  ? Joretta y jugos de fruta  ? Dulces como pasteles, galletas, helados, mermeladas y jaleas   Porciones de carbohidratos  Al planificar comidas para la diabetes, recuerde que un alimento con 1 porcin de carbohidratos contiene aproximadamente 15 gramos de carbohidratos:  ? Revise el tamao de las porciones con tazas y cucharas de medir o con una pesa de alimentos.  ? Lea los Datos de Nutricin en las etiquetas de los alimentos para saber cuntos gramos de carbohidratos contienen los alimentos que come.   Los [employer]on corporation de este folleto muestran porciones que contienen cerca de 15 gramos de carbohidratos. Copyright Academy of Nutrition and Dietetics. This handout may be duplicated for client education. Carbohydrate Counting for Diabetes (Spanish) - 2   Consejos para planificar sus comidas  ? Un Plan de Alimentacin indica cuntas porciones de carbohidratos consumir en sus comidas y refrigerios (snacks). Para muchos adultos es adecuado comer 3 a 5 porciones de carbohidratos en cada comida y de 1 a 2 porciones de carbohidratos, en cada refrigerio.  ? En un Plan de Alimentacin diaria saludable, la mayora de los carbohidratos provienen de:  o Al menos 6 porciones de frutas y vegetales sin almidn  o Al menos 6 porciones de forensic scientist, frijoles y sports administrator con almidn, con al menos 3 de estas porciones de granos integrales (enteros)  o Al menos 2 porciones de teaching laboratory technician o productos lcteos  ? Revise regularmente su nivel de glucosa en la sangre. Esto puede indicarle si necesita ajustar las horas a las que consume carbohidratos.  ? Comer alimentos que contienen [place], como granos [place], y comer muy pocos alimentos salados es bueno para su salud.  ? Coma 4 a 6 onzas de carne u otros alimentos con protenas (como hamburguesas de soya) cada da. Elija fuentes de protena bajas en grasa, como carne de res y de cerdo bajas en grasa, pollo,  pescado, queso bajo en grasa o alimentos vegetarianos como la soya.  ? Coma algunas grasas saludables, como aceite de [place], de canola y nueces.  ? Coma muy pocas grasas saturadas. Estas grasas no son saludables y se merchandiser, retail, la crema y las carnes con mucha grasa, como el tocino (tocineta) y las salchichas o advertising copywriter.  ? Coma muy pocas o nada de grasas trans. Estas grasas no son saludables y se encuentran en todos los alimentos que contienen aceites "parcialmente hidrogenados" en su lista de ingredientes.   Consejos para leer etiquetas  En los Datos de Nutricin de las etiquetas aparece una lista con el total de gramos de carbohidratos en una porcin estndar. La porcin estndar puede ser mayor o menor que 1 porcin de carbohidratos. Para saber cuntas porciones de carbohidratos hay en un alimento:  ? Primero mire el tamao de la porcin estndar de la etiqueta.  ? Luego verifique el total de gramos de carbohidratos. Esta es la cantidad de carbohidratos en 1 porcin estndar. Divida el total de gramos de carbohidratos por 15. Este nmero equivale al nmero de porciones de carbohidratos en 1 porcin estndar. Recuerde: 1 porcin de carbohidratos equivale a 15 gramos de carbohidratos.  ? Nota: Puede ignorar los gramos de azcar en los Datos de Nutricin, ya que estn incluidos en el total de gramos de carbohidratos.  Copyright Academy of Nutrition and Dietetics. This handout may be  duplicated for client education. Carbohydrate Counting for Diabetes (Spanish) - 3   Listas de alimentos para el conteo de carbohidratos  1 porcin = cerca de 15 gramos de carbohidratos  Almidones  ? 1 rebanada de pan (1 onza)  ? 1 tortilla (6 pulgadas)  ?  rosca de pan (bagel) grande (1 onza)  ? 2 tortillas para taco (5 pulgadas)  ?  pan para hamburguesa o para salchicha (hot dog) (3/4 onza)  ?  taza de cereal listo para comer sin endulzar  ?  taza de cereal cocido  ? 1 taza de sopa a base de  caldo  ? 4-6 galletitas saladas  ? ? taza de pasta o arroz (cocidos)  ?  taza de frijoles, chcharos, granos de elote, camotes (batatas, boniatos), calabaza (zapallo), pur de papas o papas hervidas (cocidos)  ?  papa grande asada (3 onzas)  ?  onza de pretzels, papitas o totopos (tortilla chips)  ? 3 tazas de palomitas de maz (popcorn) (ya preparadas)   Joretta  ? 1 fruta fresca pequea ( a 1 taza)  ?  taza de fruta enlatada o congelada  ? 17 uvas pequeas (3 onzas)  ? 1 taza de meln, bayas (moras)  ?  vaso de jugo de fruta  ? 2 cucharadas de frutas secas (arndanos azules/blueberries, cerezas, arndanos rojos/cranberries, frutas surtidas, uvas pasas/pasitas)  Leche  ? 1 taza de ppg industries o reducida en grasa  ? 1 taza de leche de soya  ? ? taza de yogur descremado endulzado con un edulcorante sin azcar (6 onzas)   Dulces y postres  ? pastel cuadrado de 2 pulgadas (sin betn/cobertura)  ? 2 galletitas dulces (? onzas)  ?  taza de helado o yogur congelado  ?  taza de sorbete (sherbet) o nieve (sorbet)  ? 1 cucharada de jarabe (sirope), mermelada, jalea, azcar o miel  ? 2 cucharadas de jarabe bajo en caloras  Copyright Academy of Nutrition and Dietetics. This handout may be duplicated for client education. Carbohydrate Counting for Diabetes (Spanish) - 4   Otros alimentos  ? Cuente 1 taza de vegetales crudos o  taza de vegetales sin almidn, cocidas, como porciones de alimentos con cero (0) carbohidratos o "sin restriccin." Si come 3 o ms porciones en una comida, cuntelas como 1 porcin de carbohidratos.  ? Los alimentos que contienen menos de 20 caloras en cada porcin tambin pueden contarse como porciones con cero carbohidratos o alimentos "sin restriccin."  ? Cuente 1 taza de guiso (estofado) u otros alimentos combinados como 2 porciones de carbohidratos.   Notas: Copyright Academy of Nutrition and Dietetics. This handout may be duplicated for client  education. Carbohydrate Counting for Diabetes (Spanish) - 5   Contar carbohidratos y la diabetes: Ejemplo de men para 1 da [place]  1 pltano/banana pequeo (1 carbohidrato)   taza de hojuelas de maz (cornflakes) (1 carbohidrato)  1 taza de leche descremada o baja en grasa (1 carbohidrato)  1 rebanada de pan de trigo integral (1 carbohidrato)  1 cucharadita de margarina   Almuerzo  2 onzas de rebanadas de pavo  2 rebanadas de pan de trigo integral (2 carbohidratos)  2 hojas de lechuga  4 palitos de apio  4 palitos de zanahoria  1 manzana mediana (1 carbohidrato)  1 taza de leche descremada o baja en grasa (1 carbohidrato)   Refrigerio  2 cucharadas de uvas pasas/pasitas (1 carbohidrato)   onzas de mini pretzels sin sal (1 carbohidrato)   Cena  3 onzas de carne asada de res, magra   papa grande asada (2 carbohidratos)  1 cucharada de crema agria reducida en grasa   taza de ejotes/habichuelas verdes/chauchas  1 taza de ensalada de vegetales  1 cucharada de aderezo para ensaladas reducido en caloras  1 panecillo de trigo integral (1 carbohidrato)  1 cucharadita de margarina  1 taza de bolitas de meln (1 carbohidrato)   Refrigerio  6 onzas de yogur de frutas bajo en grasa, sin azcar (1 carbohidrato)  2 cucharadas de nueces sin sal

## 2024-08-11 NOTE — Progress Notes (Signed)
 Peripherally Inserted Central Catheter Placement  The IV Nurse has discussed with the patient and/or persons authorized to consent for the patient, the purpose of this procedure and the potential benefits and risks involved with this procedure.  The benefits include less needle sticks, lab draws from the catheter, and the patient may be discharged home with the catheter. Risks include, but not limited to, infection, bleeding, blood clot (thrombus formation), and puncture of an artery; nerve damage and irregular heartbeat and possibility to perform a PICC exchange if needed/ordered by physician.  Alternatives to this procedure were also discussed.  Bard Power PICC patient education guide, fact sheet on infection prevention and patient information card has been provided to patient /or left at bedside.    PICC Placement Documentation  PICC Double Lumen 08/11/24 Right Basilic 41 cm 0 cm (Active)  Indication for Insertion or Continuance of Line Chronic illness with exacerbations (CF, Sickle Cell, etc.) 08/11/24 1707  Exposed Catheter (cm) 0 cm 08/11/24 1707  Site Assessment Clean, Dry, Intact 08/11/24 1707  Lumen #1 Status Flushed;Saline locked;Blood return noted 08/11/24 1707  Lumen #2 Status Flushed;Saline locked;Blood return noted 08/11/24 1707  Dressing Type Transparent;Securing device 08/11/24 1707  Dressing Status Antimicrobial disc/dressing in place;Clean, Dry, Intact 08/11/24 1707  Line Care Connections checked and tightened 08/11/24 1707  Line Adjustment (NICU/IV Team Only) No 08/11/24 1707  Dressing Intervention New dressing;Adhesive placed at insertion site (IV team only) 08/11/24 1707  Dressing Change Due 08/18/24 08/11/24 1707    PICC consent signed by patient via Spanish interpreter [phone].    Jolee Na 08/11/2024, 5:09 PM

## 2024-08-11 NOTE — TOC Initial Note (Addendum)
 Transition of Care [facility]) - Initial/Assessment Note    Patient Details  Name: Justin [name] MRN: [id number] Date of Birth: 05-Feb-1981  Transition of Care [facility]) CM/SW Contact:    Sudie Erminio Deems, RN Phone Number: 08/11/2024, 12:35 PM  Clinical Narrative:  Patient presented for shortness of breath, productive cough and lower extremity swelling. Spanish Interpreter was at the bedside for Interpretation. PTA patient was from home with his aunt. Patient states he needs a PCP in [place] and CMA will work on scheduling an appointment and placing the information on the AVS. Patient states he has issues with transportation. ICM spoke with CSW to provide transportation resources to the patient. ICM will follow for MATCH needs for medication assistance as the patient progresses.                 08-11-24 1312 [facility] will call the patient for a hospital follow-up appointment.  Expected Discharge Plan: Home/Self Care Barriers to Discharge: Continued Medical Work up   Patient Goals and CMS Choice Patient states their goals for this hospitalization and ongoing recovery are:: Plans to return home once stable.   Choice offered to / list presented to : NA      Expected Discharge Plan and Services In-house Referral: NA Discharge Planning Services: CM Consult, Follow-up appt scheduled, [facility], [facility] Post Acute Care Choice: NA Living arrangements for the past 2 months: Single Family Home                   DME Agency: NA       HH Arranged: NA          Prior Living Arrangements/Services Living arrangements for the past 2 months: Single Family Home Lives with:: Relatives Patient language and need for interpreter reviewed:: Yes (Raquel Spanish Interpreter at the bedside.) Do you feel safe going back to the place where you live?: Yes      Need for Family Participation in Patient Care: No (Comment) Care giver support system in  place?: No (comment)   Criminal Activity/Legal Involvement Pertinent to Current Situation/Hospitalization: No - Comment as needed  Activities of Daily Living   ADL Screening (condition at time of admission) Independently performs ADLs?: Yes (appropriate for developmental age) Is the patient deaf or have difficulty hearing?: No Does the patient have difficulty seeing, even when wearing glasses/contacts?: No Does the patient have difficulty concentrating, remembering, or making decisions?: No  Permission Sought/Granted Permission sought to share information with : Family Supports Permission granted to share information with : Yes, Verbal Permission Granted              Emotional Assessment Appearance:: Appears stated age Attitude/Demeanor/Rapport: Engaged Affect (typically observed): Appropriate Orientation: : Oriented to Self, Oriented to Place, Oriented to  Time, Oriented to Situation Alcohol / Substance Use: Not Applicable Psych Involvement: No (comment)  Admission diagnosis:  Atrial flutter with rapid ventricular response (HCC) [I48.92] Community acquired pneumonia, unspecified laterality [J18.9] Acute on chronic congestive heart failure, unspecified heart failure type (HCC) [I50.9] Patient Active Problem List   Diagnosis Date Noted   Atrial flutter with rapid ventricular response (HCC) 08/10/2024   Hypokalemia 08/10/2024   Elevated troponin 08/10/2024   Acute hypoxic respiratory failure (HCC) 03/09/2024   Acute on chronic heart failure (HCC) 08/22/2023   CHF (congestive heart failure) (HCC) 03/10/2023   Uncontrolled hypertension 11/17/2022   Type 2 diabetes mellitus without complication, without long-term current use of insulin  (HCC) 11/17/2022   Acute exacerbation  of CHF (congestive heart failure) (HCC) 11/16/2022   Paroxysmal atrial fibrillation with RVR (HCC) 09/22/2022   Acute on chronic congestive heart failure (HCC) 09/21/2022   CKD stage 3b, GFR 30-44 ml/min  (HCC) 09/21/2022   Alcohol abuse 09/21/2022   Class 3 obesity (HCC) 06/04/2022   AKI (acute kidney injury) 06/03/2022   Acute on chronic HFrEF (heart failure with reduced ejection fraction) (HCC) 06/02/2022   Type 2 diabetes mellitus with hyperlipidemia (HCC) 06/02/2022   Polysubstance abuse (HCC) 06/02/2022   Ventricular tachycardia (HCC)    Primary hypertension    Cardiomyopathy due to hypertension, with heart failure (HCC)    Acute systolic CHF (congestive heart failure) (HCC)    Nonrheumatic mitral valve regurgitation    Dyspnea    Community acquired pneumonia    Acute hypoxemic respiratory failure (HCC) 02/09/2022   Hypertriglyceridemia 04/18/2018   Nonischemic cardiomyopathy (HCC) 03/29/2018   Chronic diastolic heart failure (HCC) 03/29/2018   Essential hypertension 03/29/2018   PCP:  Celestia Rosaline SQUIBB, NP Pharmacy:   [facility] [address] [place] [address] Phone: [phone] Fax: [phone]  [facility] [address] [place] [address] Phone: [phone] Fax: [phone]  [facility] [address], [place] [address] [place] [address] Phone: [phone] Fax: [phone]  [facility] [address], [address] [place] [address] Phone: [phone] Fax: [phone]     Social Drivers of Health (SDOH) Social History: SDOH Screenings   Food Insecurity: Food Insecurity Present (08/11/2024)  Housing: High Risk (08/11/2024)  Transportation Needs: No Transportation Needs (08/11/2024)  Utilities: Not At Risk (08/11/2024)  Alcohol Screen: Medium Risk (02/20/2022)  Depression (PHQ2-9): Low Risk  (10/12/2022)  Financial Resource Strain: Not at Risk (05/07/2024)   Received from [facility]  Physical Activity: At Risk (05/07/2024)   [facility]  Social Connections:  Not at Risk (05/07/2024)   Received from [facility]  Stress: Not at Risk (05/07/2024)   Received from [facility]  Tobacco Use: Low Risk  (08/10/2024)  Health Literacy: Inadequate Health Literacy (09/21/2023)   SDOH Interventions:     Readmission Risk Interventions     No data to display

## 2024-08-11 NOTE — Consult Note (Addendum)
 Cardiology Consultation   Patient ID: Justin [name] MRN: [id number]; DOB: 02-05-1981  Admit date: 08/10/2024 Date of Consult: 08/11/2024  PCP:  Justin Rosaline SQUIBB, NP   [facility] Providers  Heart failure Cardiologist: Justin Shuck, MD Cardiologist:  Justin Bruckner, MD    Electrophysiologist:  Justin [name] Norton, MD      Patient Profile: Justin [name] is a 43 y.o. male with a hx of HFrEF (20-25% on 03/2024), NICMO, afibrillation and flutter on Xarelto , mitral regurgitation HTN, alcohol and cocaine abuse in remission, TIIDM, CKD 3b who is being seen on 08/11/2024 for the evaluation of heart failure management at the request of Justin Perri Raddle, MD. He is admitted for pneumonia and heart failure exacerbation.  History of Present Illness: [name] presented with cough, shortness of breath, and leg swelling for three days. He states that his symptoms have worsened for the past 3 days. He has noticed about a 10 pound weight gain in the past 3 months. He states that he has chest pain, but only when he coughs. His shortness of breath is exacerbated with movement. He was feeling fine prior to 3 days ago. He states that he takes about 5-6 pills per day at one time, but does not know all of the names of them, but does state that some are for his diabetes, some for his heart, and some for fluid balance. He does not normally have swelling of his lower extremities. He states that he snores a lot at home and has never been tested for sleep apnea. He denies palpitations or syncopal symptoms. He denies any sick contacts.  He has a history of heart failure dating bact to 2017 when he was admitted with acute systolic CHF. Has undergone LHC which has showed no CAD in 2017 and again in 2023. HF has been attributed to a combination of his hypertension and substance use.  Admitted for heart failure exacerbation in December of 2024 and again  in June of 2025. In June of 2025 he required milrinone  due to low output state. Exacerbations have been mostly attributed to being off of medications, however he does have atrial fibrillation and a-flutter which have also contributed to his heart failure. Echo in July showed an EF of 20-25% with LVH, reduced RVEF, biatrial dilation, mitral regurgitation. He also had cardiorenal syndrome at that time. He was cardioverted to NSR at that admission and started on Amiodarone .  Seen by Dr. Vergil in advanced heart failure clinic in July after he was discharged from the hospital and was doing better at that time. He was noted to be in NSR. Meds at that time were Hydralazine  100mg  TID, Imdur  90 daily, Torsemide  80mg  in the AM and 40mg  PM, Jardiance  10mg , Toprol  25mg , Amiodarone  200mg  daily. Recommended Echo in 3 months at that time. Dr. Vergil recommending cardiac PET scan to rule out cardiac sarcoidosis. Weight at that time was 108 kgs.  His care has been complicated by high no show rate and lack of insurance  This history was obtained from the patient through a video interpreter, along with chart review of his past medical records.   Past Medical History:  Diagnosis Date   Heart failure with reduced ejection fraction [facility]) 2017   [facility]   Hypertension 2017   [facility]    Past Surgical History:  Procedure Laterality Date   CARDIOVERSION N/A 03/17/2024   Procedure: CARDIOVERSION;  Surgeon: [name] Justin RAMAN, MD;  Location:  [facility] INVASIVE CV LAB;  Service: Cardiovascular;  Laterality: N/A;   RIGHT/LEFT HEART CATH AND CORONARY ANGIOGRAPHY N/A 02/16/2022   Procedure: RIGHT/LEFT HEART CATH AND CORONARY ANGIOGRAPHY;  Surgeon: Wonda Sharper, MD;  Location: [facility] INVASIVE CV LAB;  Service: Cardiovascular;  Laterality: N/A;   TRANSESOPHAGEAL ECHOCARDIOGRAM (CATH LAB) N/A 03/17/2024   Procedure: TRANSESOPHAGEAL ECHOCARDIOGRAM;  Surgeon: Justin Justin RAMAN, MD;  Location: [facility] INVASIVE CV LAB;   Service: Cardiovascular;  Laterality: N/A;     Home Medications:  Prior to Admission medications   Medication Sig Start Date End Date Taking? Authorizing Provider  amiodarone  (PACERONE ) 200 MG tablet Take 1 tablet (200 mg total) by mouth daily. 03/24/24   Justin Justin RAMAN, MD  atorvastatin  (LIPITOR) 20 MG tablet Take 1 tablet (20 mg total) by mouth daily. 03/14/24   Lee, Jordan, NP  Dulaglutide  (TRULICITY ) 0.75 MG/0.5ML SOAJ Inject 0.75 mg into the skin once a week. 03/27/24   Justin Rosaline SQUIBB, NP  empagliflozin  (JARDIANCE ) 10 MG TABS tablet Take 1 tablet (10 mg total) by mouth daily. 03/19/24   Smucker, Melvenia, MD  glimepiride  (AMARYL ) 2 MG tablet Take 2 mg by mouth daily. Patient not taking: Reported on 03/24/2024 03/06/24   [provider]  hydrALAZINE  (APRESOLINE ) 100 MG tablet Take 1 tablet (100 mg total) by mouth with breakfast, with lunch, and with evening meal. 03/19/24     isosorbide  mononitrate (IMDUR ) 30 MG 24 hr tablet Take 3 tablets (90 mg total) by mouth daily. 03/19/24     metoprolol  succinate (TOPROL  XL) 50 MG 24 hr tablet Take 1 tablet (50 mg total) by mouth daily. Take with or immediately following a meal. 04/10/24   Justin Justin RAMAN, MD  potassium chloride  SA (KLOR-CON  M) 20 MEQ tablet Take 1 tablet (20 mEq total) by mouth daily. 03/20/24     Rivaroxaban  (XARELTO ) 15 MG TABS tablet Take 1 tablet (15 mg total) by mouth daily with supper. 03/25/24   Justin Justin RAMAN, MD  torsemide  (DEMADEX ) 20 MG tablet Take 4 tablets in the morning (80mg  total) and 2 tablets (40mg  total) in the evening. 03/19/24     Reports taking 1 fluid tablet per day. Takes about 6 other pills, but only once a day.  Fill history suggests that the patient most recently filled amiodarone , Metoprolol , Jardiance  Xarelto , in October Filled hydralazine  in September for a 90 day supply Filled Imdur  in September for 90 day supply Filled torsemide  in September for 30 day supply but has only been taking one fluid  pill  Scheduled Meds:  amiodarone   200 mg Oral Daily   atorvastatin   20 mg Oral Daily   furosemide   80 mg Intravenous BID   insulin  aspart  0-15 Units Subcutaneous TID WC   insulin  aspart  0-5 Units Subcutaneous QHS   [START ON 08/12/2024] insulin  glargine-yfgn  20 Units Subcutaneous Daily   isosorbide  mononitrate  90 mg Oral Daily   potassium chloride  SA  20 mEq Oral Daily   rivaroxaban   20 mg Oral Q supper   sodium chloride  flush  3 mL Intravenous Q12H   Continuous Infusions:  cefTRIAXone  (ROCEPHIN )  IV 2 g (08/11/24 1003)   doxycycline (VIBRAMYCIN) IV 100 mg (08/11/24 1156)   Allergies:   No Known Allergies  Social History:   Social History   Socioeconomic History   Marital status: Single    Spouse name: Not on file   Number of children: 3   Years of education: Not on file   Highest education  level: 7th grade  Occupational History   Occupation: education administrator    Comment: varies  Tobacco Use   Smoking status: Never   Smokeless tobacco: Never  Vaping Use   Vaping status: Never Used  Substance and Sexual Activity   Alcohol use: Yes    Alcohol/week: 30.0 standard drinks of alcohol    Types: 30 Cans of beer per week    Comment: occ   Drug use: Not Currently    Types: Crack cocaine, Cocaine    Comment: 2 weeks   Sexual activity: Not on file  Other Topics Concern   Not on file  Social History Narrative   Not on file   Social Drivers of Health   Financial Resource Strain: Not at Risk (05/07/2024)   Received from [employer]    How hard is it for you to pay for the very basics like food, housing, heating, medical care, and medications?: 1  Food Insecurity: Food Insecurity Present (08/11/2024)   Hunger Vital Sign    Worried About Running Out of Food in the Last Year: Sometimes true    Ran Out of Food in the Last Year: Sometimes true  Transportation Needs: No Transportation Needs (08/11/2024)   PRAPARE - Administrator, Civil Service  (Medical): No    Lack of Transportation (Non-Medical): No  Physical Activity: At Risk (05/07/2024)   Received from [facility]   Physical Activity    On average, how many minutes do you engage in exercise at this level?: 2  Stress: Not at Risk (05/07/2024)   Received from [facility]   Stress    Do you feel these kinds of stress these days?: 1  Social Connections: Not at Risk (05/07/2024)   Received from [facility]   Social Connections    How often do you see or talk to people that you care about and feel close to? (For example: talking to friends on phone, visiting friends or family, going to church or club meetings): 1  Intimate Partner Violence: Not At Risk (08/11/2024)   Humiliation, Afraid, Rape, and Kick questionnaire    Fear of Current or Ex-Partner: No    Emotionally Abused: No    Physically Abused: No    Sexually Abused: No    Family History:    Family History  Problem Relation Age of Onset   Hypertension Father      ROS:  Please see the history of present illness.   All other ROS reviewed and negative.     Physical Exam/Data: Vitals:   08/11/24 0517 08/11/24 0807 08/11/24 0930 08/11/24 1219  BP: 122/89 (!) 141/77 (!) 141/77 128/78  Pulse: (!) 125 (!) 125 (!) 125 (!) 125  Resp: 18 (!) 22  19  Temp: 99.4 F (37.4 C) 99 F (37.2 C)  97.9 F (36.6 C)  TempSrc: Oral Oral  Oral  SpO2: 95% 95%  93%  Weight: 127.8 kg     Height:        Intake/Output Summary (Last 24 hours) at 08/11/2024 1608 Last data filed at 08/11/2024 1119 Gross per 24 hour  Intake 100 ml  Output 620 ml  Net -520 ml      08/11/2024    5:17 AM 08/10/2024    9:35 PM 03/27/2024    3:50 PM  Last 3 Weights  Weight (lbs) 281 lb 11.2 oz 238 lb 1.6 oz 238 lb 3.2 oz  Weight (kg) 127.778 kg 108 kg 108.047 kg  Body mass index is 45.47 kg/m.  General:  Obese, well developed, in no acute distress HEENT: normal Neck: no JVD visualized, body habitus prohibitive Vascular: No carotid bruits Cardiac:  normal S1,  S2; Tachycardic with regular rhythm Lungs:  Crackles present at the bases of the bilateral lungs Abd: Distended but non-tender Ext: 2+ peripheral edema, mildly cool, right worse than left Musculoskeletal:  No deformities, BUE and BLE strength normal and equal Skin: Warm and well perfused  EKG:  The EKG was personally reviewed and demonstrates:  RBBB, possible ST elevation in V2 and V3 Atrial flutter  Relevant CV Studies: TEE 7/7  1Left ventricular ejection fraction, by estimation, is 20 to 25%. The  left ventricle has severely decreased function. The left ventricle  demonstrates global hypokinesis. The left ventricular internal cavity size  was mildly dilated. There is mild  concentric left ventricular hypertrophy.   2. Peak RV-RA gradient 30 mmHg. Right ventricular systolic function is  moderately reduced. The right ventricular size is normal.   3. Left atrial size was moderately dilated. No left atrial/left atrial  appendage thrombus was detected.   4. Right atrial size was mildly dilated.   5. No PFO or ASD by color doppler or bubbles.   6. The aortic valve is tricuspid. Aortic valve regurgitation is not  visualized. No aortic stenosis is present.   7. There was eccentric, posteriorly-directed mitral regurgitation with  restricted posterior mitral leaflet. MR appeared severe with PISA ERO 0.6  cm^2. There was systolic flow reversal noted in the pulmonary vein doppler  pattern. No evidence of mitral  stenosis. The mean mitral valve gradient is 3.0 mmHg.   8. 3D performed of the mitral valve.   Right/Left heart cath 6/23 Patent coronary arteries with minimal luminal irregularity Low cardiac filling pressures (PCWP mean of 3 mmHg) with borderline cardiac output (CO 4.2 L/m and CI 2.0)  Laboratory Data: High Sensitivity Troponin:   Recent Labs  Lab 08/10/24 2148 08/10/24 2330  TROPONINIHS 84* 81*     Chemistry Recent Labs  Lab 08/10/24 2148 08/11/24 0631  NA 132* 131*   K 3.3* 3.7  CL 96* 95*  CO2 23 23  GLUCOSE 416* 373*  BUN 27* 25*  CREATININE 2.06* 2.09*  CALCIUM  8.8* 8.1*  MG  --  1.7  GFRNONAA 40* 39*  ANIONGAP 13 13     Hematology Recent Labs  Lab 08/10/24 2148 08/11/24 0631  WBC 11.5* 11.1*  RBC 5.60 5.42  HGB 16.8 16.4  HCT 50.5 48.8  MCV 90.2 90.0  MCH 30.0 30.3  MCHC 33.3 33.6  RDW 13.4 13.7  PLT 228 212   RVP with rhinoenterovirus UDS negative BNP Recent Labs  Lab 08/10/24 2148  BNP 193.5*     Radiology/Studies:  US  EKG SITE RITE Result Date: 08/11/2024 If Site Rite image not attached, placement could not be confirmed due to current cardiac rhythm.  DG Chest 2 View Result Date: 08/10/2024 CLINICAL DATA:  Difficulty breathing EXAM: CHEST - 2 VIEW COMPARISON:  Chest x-ray 03/09/2024.  Chest CT 02/17/2022. FINDINGS: The heart is enlarged. There central pulmonary vascular congestion. There are patchy airspace opacities in the infrahilar regions bilaterally. Costophrenic angles are clear. No pleural effusion or pneumothorax. No acute fractures are seen. IMPRESSION: 1. Cardiomegaly with central pulmonary vascular congestion. 2. Patchy airspace opacities in the infrahilar regions bilaterally may represent edema or infection. Electronically Signed   By: Greig Pique M.D.   On: 08/10/2024 22:20  Assessment and Plan: Acute on chronic heart failure exacerbation HFrEF (20-25%) secondary to NICMO Has had multiple heart failure exacerbations in the past. Negative ischemic workup but does have history of alcohol and cocaine use in the past. Most recent Echo showed LVEF of 20-25% with decreased RV function, biatrial enlargement, and moderate to severe mitral regurgitation. Moderately adherent to medication but is only taking some medications once daily. Presented with shortness of breath, cough and peripheral swelling. 20kgs above weight at the heart failure office on 7/14. 7 kgs above weight in FM office on 9/17. Unclear dry  weight. Recent exacerbation likely a combination of incomplete medical adherence, rhinoenterovirus, and re-emergence of atrial flutter. He is likely only taking Torsemide  20mg , as he reports only daily use of medication. Only with of urine output so far. Was supposed to update echo in October per cardiology notes. Home medications Torsemide  80mg  in the AM and 40mg  PM, Jardiance  10mg , Toprol  50mg , Amiodarone  200mg  daily. Exam with peripheral edema and bibasilar crackles. Given his cooler extremities and previous need for milrinone , tachycardia in the setting of low EF, will need to rule out cardiogenic shock.  -Lactic acid -Place PICC line -Cooximetry once PICC line placed -Add on LFT to previous labs to evaluate for shock -Update Echo -Discontinue Metoprolol  until we can establish that the patient is not in cardiogenic shock, as his rates may be compensating for his low EF -Lasix  80mg  IV BID -Keep K >4 -Keep Mag>2 -Daily BMP and magnesium , Follow creatinine. -Daily weights -Strict Is and Os -Cardiac PET to evaluate for sarcoidosis in the outpatient setting, may be cost-prohibitive -F/U with advanced heart failure outpatient.  Atrial Flutter with RVR S/P cardioversion at recent hospitalization. NSR at outpatient follow up. A-flutter on EKG and telemetry now. Potentially causative of heart failure exacerbation or secondary to rhinoenterovirus.  -Home amiodarone  200mg  daily -Discontinue Metoprolol  -Xarelto  20mg  daily (Creatinine clearance currently greater than 50) reduce dose to 15mg  if CKD worsens -Allow tachycardia to compensate for low EF  Hypertension Mildly elevated on admission to 159/95. Home medication Hydralazine  100mg  TID, Imdur  90 daily. Likely taking these medications each once daily. Hydralazine  held in the setting of normal BP with Imdur  and metoprolol   -Imdur  90mg  daily -Holding Hydralazine  in the setting of increased Metoprolol  dose -Diuresis as above  Chest  pain Elevated troponin Pleuritic in the setting of coughing. Troponins flat but elevated likely in the setting of demand ischemia. No CP when not coughing -Consider Lidocaine  patch  Per primary team CKDIIIv T2DM Obesity   Risk Assessment/Risk Scores:     New [place]  Heart Association (NYHA) Functional Class NYHA Class III  CHA2DS2-VASc Score =  3 On Xarelto    For questions or updates, please contact [facility] Please consult [email or web address] for contact info under   Signed, Francene Mcerlean K Hiep Ollis, MD  08/11/2024 4:08 PM    ATTENDING ATTESTATION:  After conducting a review of all available clinical information with the care team, interviewing the patient, and performing a physical exam, I agree with the findings and plan described in this note.   The patient is a 43 year old male with a history of severe nonischemic cardiomyopathy ejection fraction of 20 to 25%, atrial fibrillation and atrial flutter on Xarelto , hypertension, alcohol and cocaine abuse, type 2 diabetes, CKD stage IIIb and severe functional mitral regurgitation here with failure to thrive.  3 days ago he developed increasing shortness of breath, pleuritic chest pain and a cough.  His laboratories significant for positive rhinovirus serology,  creatinine that is around baseline.  No LFTs were drawn.  His bicarbonate is 23.  His EKGs demonstrate atrial flutter with 2-1 block and 1-1 block.  On examination he seems fairly warm and well-perfused though his right lower extremity is somewhat cool.  He is mentating well.  He denies any early satiety.  Given his low ejection fraction and atrial flutter I believe it is important that we rule out a low output or precardiogenic shock state.  He is a relatively large individual and I do not think noninvasive blood pressures are necessarily accurate.  Will place a PICC line, check a co-oximetry level and lactic acid.  If elevated we will place the patient on dobutamine given his  CKD.  For now hold beta-blocker.  Continue Xarelto  for now.  Will optimize GDMT once low output/cardiogenic shock has been ruled out.  Reviewed with Dr. Bensimohn.   GEN: No acute distress, AO x 3 HEENT:  MMM, unable to assess JVD due to neck girth  Cardiac: Tachycardic, no murmurs, rubs, or gallops.  Respiratory: Mildly decreased BS at bases, mild exp wheezes GI: Soft, nontender, non-distended  MS: No edema; No deformity.  Right lower extremity mildly cool to the touch Neuro:  Nonfocal  Vasc:  +2 radial pulses    Lurena Red, MD Pager [phone]

## 2024-08-11 NOTE — Progress Notes (Signed)
 Patient transferred to department via stretcher, able to ambulate with steady gait in room.  Interpreter services Tita [phone] used for admission history, assessment and patient education.  Patient expresses understanding of care plan.

## 2024-08-11 NOTE — H&P (Signed)
 SABRA

## 2024-08-11 NOTE — Plan of Care (Signed)
  Problem: Education: Goal: Ability to describe self-care measures that may prevent or decrease complications (Diabetes Survival Skills Education) will improve Outcome: Progressing   Problem: Coping: Goal: Ability to adjust to condition or change in health will improve Outcome: Progressing   Problem: Metabolic: Goal: Ability to maintain appropriate glucose levels will improve Outcome: Progressing   Problem: Tissue Perfusion: Goal: Adequacy of tissue perfusion will improve Outcome: Progressing   Problem: Health Behavior/Discharge Planning: Goal: Ability to manage health-related needs will improve Outcome: Progressing   Problem: Activity: Goal: Risk for activity intolerance will decrease Outcome: Progressing   Problem: Respiratory: Goal: Ability to maintain adequate ventilation will improve Outcome: Progressing   Problem: Safety: Goal: Ability to remain free from injury will improve Outcome: Progressing

## 2024-08-11 NOTE — Progress Notes (Addendum)
 PROGRESS NOTE    Justin [name]  FMW:[id number] DOB: Jul 16, 1981 DOA: 08/10/2024 PCP: Celestia Rosaline SQUIBB, NP  Chief Complaint  Patient presents with   Cough    Brief Narrative:   Justin [name] is Justin [name] 43 y.o. male with medical history significant for hypertension, type 2 diabetes mellitus, CKD 3B, alcohol and cocaine abuse in remission, atrial fibrillation and flutter on Xarelto , and nonischemic cardiomyopathy with EF 20 to 25% who presents with productive cough, shortness of breath, sore throat, and leg swelling.   He's been admitted for HF exacerbation, rhinovirus infection, atrial flutter with RVR.   Assessment & Plan:   Principal Problem:   Community acquired pneumonia Active Problems:   Acute on chronic HFrEF (heart failure with reduced ejection fraction) (HCC)   Essential hypertension   CKD stage 3b, GFR 30-44 ml/min (HCC)   Uncontrolled hypertension   Type 2 diabetes mellitus without complication, without long-term current use of insulin  (HCC)   Atrial flutter with rapid ventricular response (HCC)   Hypokalemia   Elevated troponin  Acute Hypoxic Respiratory Failure in the setting of  Acute Systolic Heart Failure Exacerbation  Pulmonary Edema  Rhinovirus Infection  Hypoxic to 89% on RA, now on 2 L [place] [facility] from 03/2024 with EF 20-25%, RVSF moderately reduced, severe MR CXR with patchy airspace opacities in infrahilar regions bilaterally Volume overloaded on exam, he notes his dry weight around 260-270 lbs (05/28/2024 weight was 266 lbs) (weight here is 281.7 lbs today  Continue lasix  80 mg BID IV Appreciate cardiology assistance with volume in setting of his systolic HF Afebrile, mildly elevated WBC count.  Normal procalcitonin.  Low suspicion for superimposed bacterial pneumonia, will plan for short course of antibiotics given his acute illness.   Atrial Flutter with RVR In the setting of HF and infection above Follow with  diuresis Continue amiodarone , will increase metoprolol  to 25 mg q6 Continue xarelto  Appreciate cardiology assistance  CKD IIIb Follow with diuresis  T2DM Basal, SSI, adjust as needed Takes jardiance , amaryl , trulicity  at home Suspect he might need insulin , follow repeat A1c  Elevated Troponin  Mild and flat, no CP  Electrolyte abnormalities Hypokalemia Replace and follow   Hypertension Hydralazine  on hold   Class III Obesity Body mass index is 45.47 kg/m.  Spanish Speaking Patient Needs interpreter    DVT prophylaxis: xarelto  Code Status: full Family Communication: none Disposition:   Status is: Inpatient Remains inpatient appropriate because: need for continued inpatient care   Consultants:  cardiology  Procedures:  none  Antimicrobials:  Anti-infectives (From admission, onward)    Start     Dose/Rate Route Frequency Ordered Stop   08/11/24 1000  cefTRIAXone  (ROCEPHIN ) 2 g in sodium chloride  0.9 % 100 mL IVPB        2 g 200 mL/hr over 30 Minutes Intravenous Every 24 hours 08/11/24 0002 08/16/24 0959   08/10/24 2330  doxycycline (VIBRAMYCIN) 100 mg in sodium chloride  0.9 % 250 mL IVPB        100 mg 125 mL/hr over 120 Minutes Intravenous Every 12 hours 08/10/24 2326     08/10/24 2315  azithromycin  (ZITHROMAX ) 500 mg in sodium chloride  0.9 % 250 mL IVPB  Status:  Discontinued        500 mg 250 mL/hr over 60 Minutes Intravenous  Once 08/10/24 2300 08/10/24 2326   08/10/24 2315  cefTRIAXone  (ROCEPHIN ) 1 g in sodium chloride  0.9 % 100 mL IVPB        1  g 200 mL/hr over 30 Minutes Intravenous  Once 08/10/24 2300 08/11/24 0005       Subjective: No complaints today Interpreter used Feeling Annlouise Gerety little better  Objective: Vitals:   08/11/24 0517 08/11/24 0807 08/11/24 0930 08/11/24 1219  BP: 122/89 (!) 141/77 (!) 141/77 128/78  Pulse: (!) 125 (!) 125 (!) 125 (!) 125  Resp: 18 (!) 22  19  Temp: 99.4 F (37.4 C) 99 F (37.2 C)  97.9 F (36.6 C)   TempSrc: Oral Oral  Oral  SpO2: 95% 95%  93%  Weight: 127.8 kg     Height:        Intake/Output Summary (Last 24 hours) at 08/11/2024 1317 Last data filed at 08/11/2024 1119 Gross per 24 hour  Intake 100 ml  Output 620 ml  Net -520 ml   Filed Weights   08/10/24 2135 08/11/24 0517  Weight: 108 kg 127.8 kg    Examination:  General exam: Appears calm and comfortable - sitting up on edge of bed Respiratory system: unlabored, scattered rhonchi Cardiovascular system: regular, tachycardic - tele with flutter with RVR Gastrointestinal system: Abdomen is nondistended, soft and nontender. Central nervous system: Alert and oriented. No focal neurological deficits. Extremities: bilateral LE edema    Data Reviewed: I have personally reviewed following labs and imaging studies  CBC: Recent Labs  Lab 08/10/24 2148 08/11/24 0631  WBC 11.5* 11.1*  HGB 16.8 16.4  HCT 50.5 48.8  MCV 90.2 90.0  PLT 228 212    Basic Metabolic Panel: Recent Labs  Lab 08/10/24 2148 08/11/24 0631  NA 132* 131*  K 3.3* 3.7  CL 96* 95*  CO2 23 23  GLUCOSE 416* 373*  BUN 27* 25*  CREATININE 2.06* 2.09*  CALCIUM  8.8* 8.1*  MG  --  1.7    GFR: Estimated Creatinine Clearance: 57 mL/min (Terril Amaro) (by C-G formula based on SCr of 2.09 mg/dL (H)).  Liver Function Tests: No results for input(s): AST, ALT, ALKPHOS, BILITOT, PROT, ALBUMIN in the last 168 hours.  CBG: Recent Labs  Lab 08/11/24 0040 08/11/24 0806 08/11/24 1215  GLUCAP 370* 362* 354*     Recent Results (from the past 240 hours)  Resp panel by RT-PCR (RSV, Flu Dawnyel Leven&B, Covid) Anterior Nasal Swab     Status: None   Collection Time: 08/10/24  9:27 PM   Specimen: Anterior Nasal Swab  Result Value Ref Range Status   SARS Coronavirus 2 by RT PCR NEGATIVE NEGATIVE Final   Influenza Loyalty Arentz by PCR NEGATIVE NEGATIVE Final   Influenza B by PCR NEGATIVE NEGATIVE Final    Comment: (NOTE) The Xpert Xpress SARS-CoV-2/FLU/RSV plus assay is  intended as an aid in the diagnosis of influenza from Nasopharyngeal swab specimens and should not be used as Patrena Santalucia sole basis for treatment. Nasal washings and aspirates are unacceptable for Xpert Xpress SARS-CoV-2/FLU/RSV testing.  Fact Sheet for Patients: bloggercourse.com  Fact Sheet for Healthcare Providers: seriousbroker.it  This test is not yet approved or cleared by the [place]  FDA and has been authorized for detection and/or diagnosis of SARS-CoV-2 by FDA under an Emergency Use Authorization (EUA). This EUA will remain in effect (meaning this test can be used) for the duration of the COVID-19 declaration under Section 564(b)(1) of the Act, 21 U.S.C. section 360bbb-3(b)(1), unless the authorization is terminated or revoked.     Resp Syncytial Virus by PCR NEGATIVE NEGATIVE Final    Comment: (NOTE) Fact Sheet for Patients: bloggercourse.com  Fact Sheet for Healthcare Providers: seriousbroker.it  This test is not yet approved or cleared by the [place]  FDA and has been authorized for detection and/or diagnosis of SARS-CoV-2 by FDA under an Emergency Use Authorization (EUA). This EUA will remain in effect (meaning this test can be used) for the duration of the COVID-19 declaration under Section 564(b)(1) of the Act, 21 U.S.C. section 360bbb-3(b)(1), unless the authorization is terminated or revoked.  Performed at [facility], [address] [place], [place] [address]   Group Briton Sellman Strep by PCR     Status: None   Collection Time: 08/10/24  9:27 PM   Specimen: Anterior Nasal Swab; Sterile Swab  Result Value Ref Range Status   Group Marquesa Rath Strep by PCR NOT DETECTED NOT DETECTED Final    Comment: Performed at [facility], [address], [place], [place] [address]  Respiratory (~20 pathogens) panel by PCR     Status: Abnormal   Collection Time: 08/11/24  1:32  AM   Specimen: Nasopharyngeal Swab; Respiratory  Result Value Ref Range Status   Adenovirus NOT DETECTED NOT DETECTED Final   Coronavirus 229E NOT DETECTED NOT DETECTED Final    Comment: (NOTE) The Coronavirus on the Respiratory Panel, DOES NOT test for the novel  Coronavirus (2019 nCoV)    Coronavirus HKU1 NOT DETECTED NOT DETECTED Final   Coronavirus NL63 NOT DETECTED NOT DETECTED Final   Coronavirus OC43 NOT DETECTED NOT DETECTED Final   Metapneumovirus NOT DETECTED NOT DETECTED Final   Rhinovirus / Enterovirus DETECTED (Yaquelin Langelier) NOT DETECTED Final   Influenza Dim Meisinger NOT DETECTED NOT DETECTED Final   Influenza B NOT DETECTED NOT DETECTED Final   Parainfluenza Virus 1 NOT DETECTED NOT DETECTED Final   Parainfluenza Virus 2 NOT DETECTED NOT DETECTED Final   Parainfluenza Virus 3 NOT DETECTED NOT DETECTED Final   Parainfluenza Virus 4 NOT DETECTED NOT DETECTED Final   Respiratory Syncytial Virus NOT DETECTED NOT DETECTED Final   Bordetella pertussis NOT DETECTED NOT DETECTED Final   Bordetella Parapertussis NOT DETECTED NOT DETECTED Final   Chlamydophila pneumoniae NOT DETECTED NOT DETECTED Final   Mycoplasma pneumoniae NOT DETECTED NOT DETECTED Final    Comment: Performed at [facility], [address], [place], [place] [address]         Radiology Studies: DG Chest 2 View Result Date: 08/10/2024 CLINICAL DATA:  Difficulty breathing EXAM: CHEST - 2 VIEW COMPARISON:  Chest x-ray 03/09/2024.  Chest CT 02/17/2022. FINDINGS: The heart is enlarged. There central pulmonary vascular congestion. There are patchy airspace opacities in the infrahilar regions bilaterally. Costophrenic angles are clear. No pleural effusion or pneumothorax. No acute fractures are seen. IMPRESSION: 1. Cardiomegaly with central pulmonary vascular congestion. 2. Patchy airspace opacities in the infrahilar regions bilaterally may represent edema or infection. Electronically Signed   By: Greig Pique M.D.   On:  08/10/2024 22:20        Scheduled Meds:  amiodarone   200 mg Oral Daily   atorvastatin   20 mg Oral Daily   furosemide   80 mg Intravenous BID   insulin  aspart  0-5 Units Subcutaneous QHS   insulin  aspart  0-6 Units Subcutaneous TID WC   insulin  glargine-yfgn  10 Units Subcutaneous Daily   isosorbide  mononitrate  90 mg Oral Daily   metoprolol  tartrate  25 mg Oral Q6H   potassium chloride  SA  20 mEq Oral Daily   rivaroxaban   20 mg Oral Q supper   sodium chloride  flush  3 mL Intravenous Q12H  Continuous Infusions:  cefTRIAXone  (ROCEPHIN )  IV 2 g (08/11/24 1003)   doxycycline  (VIBRAMYCIN ) IV 100 mg (08/11/24 1156)     LOS: 1 day    Time spent: over 30 min     Meliton Monte, MD [facility]   To contact the attending provider between 7A-7P or the covering provider during after hours 7P-7A, please log into the web site [email or web address] and access using universal Johnson password for that web site. If you do not have the password, please call the hospital operator.  08/11/2024, 1:17 PM

## 2024-08-11 NOTE — Progress Notes (Signed)
   08/11/24 0123  Assess: MEWS Score  Temp 99.5 F (37.5 C)  BP 124/77  MAP (mmHg) 91  Pulse Rate (!) 125  ECG Heart Rate (!) 126  Resp 19  Level of Consciousness Alert  SpO2 93 %  O2 Device Nasal Cannula  O2 Flow Rate (L/min) 2 L/min  Assess: MEWS Score  MEWS Temp 0  MEWS Systolic 0  MEWS Pulse 2  MEWS RR 0  MEWS LOC 0  MEWS Score 2  MEWS Score Color Yellow  Assess: if the MEWS score is Yellow or Red  Were vital signs accurate and taken at a resting state? Yes  Does the patient meet 2 or more of the SIRS criteria? Yes  Does the patient have a confirmed or suspected source of infection? Yes  MEWS guidelines implemented  Yes, yellow  Treat  MEWS Interventions Considered administering scheduled or prn medications/treatments as ordered  Take Vital Signs  Increase Vital Sign Frequency  Yellow: Q2hr x1, continue Q4hrs until patient remains green for 12hrs  Escalate  MEWS: Escalate Yellow: Discuss with charge nurse and consider notifying provider and/or RRT  Notify: Charge Nurse/RN  Name of Charge Nurse/RN Notified Christina RN  Provider Notification  Provider Name/Title Franky  Date Provider Notified 08/11/24  Time Provider Notified 0143  Method of Notification Page  Notification Reason Other (Comment) (yellow MEWS, high HR MD aware)  Provider response No new orders  Date of Provider Response 08/11/24  Time of Provider Response 0144  Notify: Rapid Response  Name of Rapid Response RN Notified N/A  Assess: SIRS CRITERIA  SIRS Temperature  0  SIRS Respirations  0  SIRS Pulse 1  SIRS WBC 0  SIRS Score Sum  1

## 2024-08-11 NOTE — Progress Notes (Addendum)
 PICC line has been placed but lactate and co-ox still not back yet - reached out to PM nurse and connected them with on call cardiology fellow to please relay when result is back. Dr. Wendel had suggested starting dobutamine @ 2.5 if signs of cardiogenic shock. Spoke with Dr. Donnel with our team to follow up. Also added CVP monitoring qshift.  Addendum:  Co ox cam back low 47, however, BP is stable, Lactate is negative, LFTs negative. Continue IV lasix  and continue to monitor. If cox go down or hemodynamics changes- then we will start dobutamine.  Grayce Donnel

## 2024-08-11 NOTE — Telephone Encounter (Unsigned)
 Copied from CRM #[id number]. Topic: Appointments - Scheduling Inquiry for Clinic >> Aug 11, 2024  1:09 PM Winona SAUNDERS wrote: Princella from [facility] calling to schedule pt a hospital follow up however first avail is Dec 26th. Pt discharge date may be Wednesday 12/03

## 2024-08-11 NOTE — Plan of Care (Signed)

## 2024-08-11 NOTE — TOC CM/SW Note (Signed)
 Transition of Care (TOC) CM/SW Note    CSW attached transportation resources to patients AVS in spanish.

## 2024-08-12 ENCOUNTER — Other Ambulatory Visit (HOSPITAL_COMMUNITY): Payer: Self-pay

## 2024-08-12 LAB — MAGNESIUM: Magnesium: 2 mg/dL (ref 1.7–2.4)

## 2024-08-12 LAB — BASIC METABOLIC PANEL WITH GFR
Anion gap: 4 — ABNORMAL LOW (ref 5–15)
BUN: 28 mg/dL — ABNORMAL HIGH (ref 6–20)
CO2: 32 mmol/L (ref 22–32)
Calcium: 8.5 mg/dL — ABNORMAL LOW (ref 8.9–10.3)
Chloride: 96 mmol/L — ABNORMAL LOW (ref 98–111)
Creatinine, Ser: 2.22 mg/dL — ABNORMAL HIGH (ref 0.61–1.24)
GFR, Estimated: 37 mL/min — ABNORMAL LOW (ref >60)
Glucose, Bld: 192 mg/dL — ABNORMAL HIGH (ref 70–99)
Potassium: 3.6 mmol/L (ref 3.5–5.1)
Sodium: 132 mmol/L — ABNORMAL LOW (ref 135–145)

## 2024-08-12 LAB — COOXEMETRY PANEL
Carboxyhemoglobin: 2.3 % — ABNORMAL HIGH (ref 0.5–1.5)
Carboxyhemoglobin: 2.1 % — ABNORMAL HIGH (ref 0.5–1.5)
Methemoglobin: <0.7 % (ref 0.0–1.5)
Methemoglobin: <0.7 % (ref 0.0–1.5)
O2 Saturation: 64.4 %
O2 Saturation: 59.7 %
Total hemoglobin: 16 g/dL (ref 12.0–16.0)
Total hemoglobin: 16.3 g/dL — ABNORMAL HIGH (ref 12.0–16.0)

## 2024-08-12 LAB — GLUCOSE, CAPILLARY
Glucose-Capillary: 252 mg/dL — ABNORMAL HIGH (ref 70–99)
Glucose-Capillary: 370 mg/dL — ABNORMAL HIGH (ref 70–99)
Glucose-Capillary: 320 mg/dL — ABNORMAL HIGH (ref 70–99)
Glucose-Capillary: 341 mg/dL — ABNORMAL HIGH (ref 70–99)

## 2024-08-12 LAB — CBC
HCT: 47.6 % (ref 39.0–52.0)
Hemoglobin: 15.9 g/dL (ref 13.0–17.0)
MCH: 30.4 pg (ref 26.0–34.0)
MCHC: 33.4 g/dL (ref 30.0–36.0)
MCV: 91 fL (ref 80.0–100.0)
Platelets: 190 K/uL (ref 150–400)
RBC: 5.23 MIL/uL (ref 4.22–5.81)
RDW: 13.7 % (ref 11.5–15.5)
WBC: 7.1 K/uL (ref 4.0–10.5)
nRBC: 0 % (ref 0.0–0.2)

## 2024-08-12 LAB — HEMOGLOBIN A1C
Hgb A1c MFr Bld: 13.9 % — ABNORMAL HIGH (ref 4.8–5.6)
Mean Plasma Glucose: 352 mg/dL

## 2024-08-12 MED ORDER — INSULIN ASPART 100 UNIT/ML IJ SOLN
4.0000 [IU] | Freq: Three times a day (TID) | INTRAMUSCULAR | Status: DC
  Administered 2024-08-12 – 2024-08-13 (×2): 4 [IU] via SUBCUTANEOUS
  Filled 2024-08-12 (×2): qty 4

## 2024-08-12 MED ORDER — AMIODARONE HCL IN DEXTROSE 360-4.14 MG/200ML-% IV SOLN
60.0000 mg/h | INTRAVENOUS | Status: AC
  Administered 2024-08-12: 60 mg/h via INTRAVENOUS
  Filled 2024-08-12 (×2): qty 200

## 2024-08-12 MED ORDER — AMIODARONE HCL IN DEXTROSE 360-4.14 MG/200ML-% IV SOLN
30.0000 mg/h | INTRAVENOUS | Status: DC
  Administered 2024-08-12 – 2024-08-19 (×14): 30 mg/h via INTRAVENOUS
  Filled 2024-08-12 (×14): qty 200

## 2024-08-12 MED ORDER — AMIODARONE LOAD VIA INFUSION
150.0000 mg | Freq: Once | INTRAVENOUS | Status: AC
  Administered 2024-08-12: 150 mg via INTRAVENOUS
  Filled 2024-08-12: qty 83.34

## 2024-08-12 MED ORDER — METOLAZONE 5 MG PO TABS
5.0000 mg | ORAL_TABLET | Freq: Once | ORAL | Status: AC
  Administered 2024-08-12: 5 mg via ORAL
  Filled 2024-08-12: qty 1

## 2024-08-12 MED ORDER — POTASSIUM CHLORIDE CRYS ER 20 MEQ PO TBCR
40.0000 meq | EXTENDED_RELEASE_TABLET | Freq: Once | ORAL | Status: AC
  Administered 2024-08-12: 40 meq via ORAL
  Filled 2024-08-12: qty 2

## 2024-08-12 NOTE — Progress Notes (Addendum)
 Cardiology Admission History and Physical:    Patient ID: Justin [name] MRN: [id number]; DOB: December 06, 1980   Admission date: 08/10/2024  Heart failure Cardiologist: [name], MD Cardiologist:  Shelda Bruckner, MD     Electrophysiologist:  Soyla [name] Norton, MD    Chief Complaint:  Shortness of breath, leg swelling  Patient Profile:   Justin [name] is a 43 y.o. male with PMH of HFrEF (20-25% on 03/2024), NICMO, afibrillation and flutter on Xarelto , mitral regurgitation HTN, alcohol and cocaine abuse in remission, TIIDM, CKD 3b who is being seen on 08/11/2024 for the evaluation of heart failure management at the request of Meliton Perri Raddle, MD. He is admitted for heart failure exacerbation.   Interval history:   [name] was seen at bedside this morning. He reports that he feels much better than yesterday. Pleuritic chest pain is gone, shortness of breath and edema improved this morning. Reports good urinary output.  PICC placed, Coox low so started on Dobutamine with improvement this morning. CVP 15.   Visit interpreted with video interpreting services  Physical Exam/Data:   Vitals:   08/12/24 0530 08/12/24 0600 08/12/24 0700 08/12/24 0729  BP: (!) 113/94 (!) 149/102 132/86   Pulse:      Resp:    (!) 25  Temp:      TempSrc:      SpO2:    96%  Weight:      Height:        Intake/Output Summary (Last 24 hours) at 08/12/2024 0819 Last data filed at 08/12/2024 9364 Gross per 24 hour  Intake 1470.71 ml  Output 1310 ml  Net 160.71 ml      08/11/2024    5:17 AM 08/10/2024    9:35 PM 03/27/2024    3:50 PM  Last 3 Weights  Weight (lbs) 281 lb 11.2 oz 238 lb 1.6 oz 238 lb 3.2 oz  Weight (kg) 127.778 kg 108 kg 108.047 kg     Body mass index is 45.47 kg/m.   Physical Exam Constitutional:      General: He is not in acute distress.    Appearance: He is obese. He is not ill-appearing.  Cardiovascular:     Rate  and Rhythm: Regular rhythm. Tachycardia present.     Pulses: Normal pulses.     Heart sounds: No murmur heard.    No friction rub. No gallop.  Pulmonary:     Effort: Pulmonary effort is normal. No respiratory distress.     Breath sounds: No wheezing.     Comments: Crackles at bilateral bases Abdominal:     General: There is distension.     Tenderness: There is no abdominal tenderness.  Musculoskeletal:     Comments: 1+ bilateral edema  Neurological:     Mental Status: He is alert.    CVP 15 when lying flat   EKG:  The ECG that was done 12/1 showed 2:1 AV conduction  Relevant CV Studies: TEE 7/7  1Left ventricular ejection fraction, by estimation, is 20 to 25%. The  left ventricle has severely decreased function. The left ventricle  demonstrates global hypokinesis. The left ventricular internal cavity size  was mildly dilated. There is mild  concentric left ventricular hypertrophy.   2. Peak RV-RA gradient 30 mmHg. Right ventricular systolic function is  moderately reduced. The right ventricular size is normal.   3. Left atrial size was moderately dilated. No left atrial/left atrial  appendage thrombus was detected.  4. Right atrial size was mildly dilated.   5. No PFO or ASD by color doppler or bubbles.   6. The aortic valve is tricuspid. Aortic valve regurgitation is not  visualized. No aortic stenosis is present.   7. There was eccentric, posteriorly-directed mitral regurgitation with  restricted posterior mitral leaflet. MR appeared severe with PISA ERO 0.6  cm^2. There was systolic flow reversal noted in the pulmonary vein doppler  pattern. No evidence of mitral  stenosis. The mean mitral valve gradient is 3.0 mmHg.   8. 3D performed of the mitral valve.    Right/Left heart cath 6/23 Patent coronary arteries with minimal luminal irregularity Low cardiac filling pressures (PCWP mean of 3 mmHg) with borderline cardiac output (CO 4.2 L/m and CI 2.0)  Laboratory  Data:  Chemistry Recent Labs  Lab 08/11/24 0631 08/12/24 0448  NA 131* 132*  K 3.7 3.6  CL 95* 96*  CO2 23 32  GLUCOSE 373* 192*  BUN 25* 28*  CREATININE 2.09* 2.22*  CALCIUM  8.1* 8.5*  GFRNONAA 39* 37*  ANIONGAP 13 4*    Recent Labs  Lab 08/11/24 2022  PROT 7.0  ALBUMIN 2.9*  AST 25  ALT 22  ALKPHOS 88  BILITOT 0.6   Hematology Recent Labs  Lab 08/11/24 0631 08/12/24 0448  WBC 11.1* 7.1  RBC 5.42 5.23  HGB 16.4 15.9  HCT 48.8 47.6  MCV 90.0 91.0  MCH 30.3 30.4  MCHC 33.6 33.4  RDW 13.7 13.7  PLT 212 190   Cardiac EnzymesNo results for input(s): TROPONINI in the last 168 hours. No results for input(s): TROPIPOC in the last 168 hours.  BNP Recent Labs  Lab 08/10/24 2148  BNP 193.5*    DDimer No results for input(s): DDIMER in the last 168 hours.  Coox improved from 47.8 to 64.4 this morning after starting Dobutamine  Radiology/Studies:  US  EKG SITE RITE Result Date: 08/11/2024 If Site Rite image not attached, placement could not be confirmed due to current cardiac rhythm.    Assessment and Plan:   Low output acute on chronic heart failure exacerbation HFrEF (20-25%) secondary to [facility] History of recurrent heart failure exacerbations. Net neutral yesterday I/O. Peripheral edema improving along with subjective shortness of breath. Remains on 2L of O[place]. Started on Dobutamine due to low oxygen saturation of 47.8 on Coox, improved to 64.4 this morning. CVP 15 while lying down. On exam, he has improving peripheral edema and continued crackles on lung auscultation. Heart failure team on board. LFT without signs of shock. Lactic acid normal. Pulse pressure not narrowed but concern for accurate read given large arm size. Creatinine mildly worse that yesterday.   -Heart failure team consulted -Cooximetry and CVP daily -Update Echo -Hold Metoprolol  -Dobutamine 2.5mcg/kg/min -Start metolazone  -Lasix  80mg  IV BID -Keep K >4 -Keep Mag>2 -Daily BMP and  magnesium , Follow creatinine. -Daily weights -Strict Is and Os -Cardiac PET to evaluate for sarcoidosis in the outpatient setting, may be cost-prohibitive -F/U with advanced heart failure outpatient.  Atrial Flutter with RVR S/P cardioversion at recent hospitalization. NSR at outpatient follow up. 2:1 A-flutter on EKG and telemetry now. Potentially causative of heart failure exacerbation or secondary to rhinoenterovirus.   -Home amiodarone  200mg  daily -Hold Metoprolol  -Xarelto  20mg  daily (Creatinine clearance currently greater than 50) reduce dose to 15mg  if CKD worsens -Allow tachycardia to compensate for low EF   Hypertension Mildly elevated on admission to 159/95. Home medication Hydralazine  100mg  TID, Imdur  90 daily. Likely taking these medications  each once daily. Hydralazine  held in the setting of normal BP with Imdur  and metoprolol . BP 120/84 this morning.   -Imdur  90mg  daily -Holding Hydralazine  -Diuresis as above   Chest pain (Resolved) Elevated troponin Pleuritic in the setting of coughing. Chest pain free today. Troponins flat but elevated likely in the setting of demand ischemia.  -Consider Lidocaine  patch  Per primary team CKDIIIv T2DM Obesity   For questions or updates, please contact [facility] Please consult [email or web address] for contact info under       Signed, Melvenia Morrison, MD  08/12/2024 8:19 AM     ATTENDING ATTESTATION:  After conducting a review of all available clinical information with the care team, interviewing the patient, and performing a physical exam, I agree with the findings and plan described in this note.   GEN: No acute distress, AO x 3 HEENT:  MMM, JVD difficult to assess, no scleral icterus Cardiac: Tachycardic, no murmurs, rubs, or gallops.  Respiratory: Mild rales bilaterally GI: Soft, nontender, non-distended  MS: No edema; No deformity.  Warm and well perfused Neuro:  Nonfocal  Vasc:  +2 radial pulses  Co-ox  low overnight, started on DBA 2.5 with co-ox this AM 64.  CVP this AM 16.  Continue DBA 2.5, lasix  80 IV BID.  Starting metolazone  per AHF.  BP reasonable on Imdur .  Agree with pursuing TEE CV once euvolemic.  Will optimize GDMT prior to discharge.  Has CKDIV so SGLT2i and ARB/Entresto  would be indicated but patient has many challenges with medical compliance.  Appreciate AHF input.  Lurena Red, MD Pager [phone]

## 2024-08-12 NOTE — Plan of Care (Signed)
  Problem: Education: Goal: Ability to describe self-care measures that may prevent or decrease complications (Diabetes Survival Skills Education) will improve Outcome: Progressing Goal: Individualized Educational Video(s) Outcome: Progressing   Problem: Metabolic: Goal: Ability to maintain appropriate glucose levels will improve Outcome: Progressing   Problem: Skin Integrity: Goal: Risk for impaired skin integrity will decrease Outcome: Progressing

## 2024-08-12 NOTE — Progress Notes (Signed)
 PROGRESS NOTE    Justin [name]  FMW:[id number] DOB: 1981/01/26 DOA: 08/10/2024 PCP: Celestia Rosaline SQUIBB, NP  Chief Complaint  Patient presents with   Cough    Brief Narrative:   Justin [name] is [name] 43 y.o. male with medical history significant for hypertension, type 2 diabetes mellitus, CKD 3B, alcohol and cocaine abuse in remission, atrial fibrillation and flutter on Xarelto , and nonischemic cardiomyopathy with EF 20 to 25% who presents with productive cough, shortness of breath, sore throat, and leg swelling.   He's been admitted for HF exacerbation, rhinovirus infection, atrial flutter with RVR.   HF team consulted.   Assessment & Plan:   Principal Problem:   Community acquired pneumonia Active Problems:   Acute on chronic HFrEF (heart failure with reduced ejection fraction) (HCC)   Essential hypertension   CKD stage 3b, GFR 30-44 ml/min (HCC)   Uncontrolled hypertension   Type 2 diabetes mellitus without complication, without long-term current use of insulin  (HCC)   Atrial flutter with rapid ventricular response (HCC)   Hypokalemia   Elevated troponin  Acute Hypoxic Respiratory Failure in the setting of  Acute Systolic Heart Failure Exacerbation  Pulmonary Edema  Rhinovirus Infection  Hypoxic to 89% on RA, now on 2 L [place] Echo from 03/2024 with EF 20-25%, RVSF moderately reduced, severe MR CXR with patchy airspace opacities in infrahilar regions bilaterally Appreciate cardiology assistance with volume in setting of his systolic HF - diuresis, dobutamine per cardiology Volume overloaded on exam, he notes his dry weight around 260-270 lbs (05/28/2024 weight was 266 lbs) (weight here is 282.3. lbs 12/2)  Afebrile, mildly elevated WBC count.  Normal procalcitonin.  Low suspicion for superimposed bacterial pneumonia, will plan for short course of antibiotics given his acute illness (stop after 3 days, discussed with pharmacy).   Atrial Flutter  with RVR In the setting of HF and infection above Follow with diuresis Continue amiodarone  - transitioned to IV Holding beta blocker due to acute HF above Continue xarelto  Appreciate cardiology assistance  CKD IIIb Baseline creatinine appears to be 2.4-2.8 Creatinine at admission, lower than recent baseline Follow with diuresis - creatinine creeping up, not meeting criteria for AKI yet  T2DM Basal, bolus, SSI, adjust as needed (hyperglycemic today) Takes jardiance , amaryl , trulicity  at home Suspect he might need insulin  at discharge, follow repeat A1c  Elevated Troponin  Mild and flat, no CP  Electrolyte abnormalities Hypokalemia Replace and follow   Hypertension Hydralazine  on hold   Class III Obesity Body mass index is 45.56 kg/m.  Spanish Speaking Patient Needs interpreter    DVT prophylaxis: xarelto  Code Status: full Family Communication: none Disposition:   Status is: Inpatient Remains inpatient appropriate because: need for continued inpatient care   Consultants:  cardiology  Procedures:  none  Antimicrobials:  Anti-infectives (From admission, onward)    Start     Dose/Rate Route Frequency Ordered Stop   08/11/24 1000  cefTRIAXone  (ROCEPHIN ) 2 g in sodium chloride  0.9 % 100 mL IVPB  Status:  Discontinued        2 g 200 mL/hr over 30 Minutes Intravenous Every 24 hours 08/11/24 0002 08/12/24 0931   08/10/24 2330  doxycycline (VIBRAMYCIN) 100 mg in sodium chloride  0.9 % 250 mL IVPB  Status:  Discontinued        100 mg 125 mL/hr over 120 Minutes Intravenous Every 12 hours 08/10/24 2326 08/12/24 0931   08/10/24 2315  azithromycin  (ZITHROMAX ) 500 mg in sodium chloride  0.9 % 250  mL IVPB  Status:  Discontinued        500 mg 250 mL/hr over 60 Minutes Intravenous  Once 08/10/24 2300 08/10/24 2326   08/10/24 2315  cefTRIAXone  (ROCEPHIN ) 1 g in sodium chloride  0.9 % 100 mL IVPB        1 g 200 mL/hr over 30 Minutes Intravenous  Once 08/10/24 2300 08/11/24  0005       Subjective: Interpreter used Feeling Alvina Strother little better today  Objective: Vitals:   08/12/24 0729 08/12/24 0812 08/12/24 0904 08/12/24 1019  BP:  120/84 (!) 117/55   Pulse:  (!) 136    Resp: (!) 25 (!) 25    Temp:      TempSrc:      SpO2: 96% 98%    Weight:    128.1 kg  Height:        Intake/Output Summary (Last 24 hours) at 08/12/2024 1110 Last data filed at 08/12/2024 1019 Gross per 24 hour  Intake 1770.71 ml  Output 2110 ml  Net -339.29 ml   Filed Weights   08/10/24 2135 08/11/24 0517 08/12/24 1019  Weight: 108 kg 127.8 kg 128.1 kg    Examination:  General: No acute distress. Cardiovascular: tachy - flutter on tele Lungs: diminished due to body habitus, occasional scattered wheezing appreciated Neurological: Alert and oriented 3. Moves all extremities 4 with equal strength. Cranial nerves II through XII grossly intact. Extremities: bilateral LE edema    Data Reviewed: I have personally reviewed following labs and imaging studies  CBC: Recent Labs  Lab 08/10/24 2148 08/11/24 0631 08/12/24 0448  WBC 11.5* 11.1* 7.1  HGB 16.8 16.4 15.9  HCT 50.5 48.8 47.6  MCV 90.2 90.0 91.0  PLT 228 212 190    Basic Metabolic Panel: Recent Labs  Lab 08/10/24 2148 08/11/24 0631 08/12/24 0448  NA 132* 131* 132*  K 3.3* 3.7 3.6  CL 96* 95* 96*  CO2 23 23 32  GLUCOSE 416* 373* 192*  BUN 27* 25* 28*  CREATININE 2.06* 2.09* 2.22*  CALCIUM  8.8* 8.1* 8.5*  MG  --  1.7 2.0    GFR: Estimated Creatinine Clearance: 53.8 mL/min (Xaniyah Buchholz) (by C-G formula based on SCr of 2.22 mg/dL (H)).  Liver Function Tests: Recent Labs  Lab 08/11/24 2022  AST 25  ALT 22  ALKPHOS 88  BILITOT 0.6  PROT 7.0  ALBUMIN 2.9*    CBG: Recent Labs  Lab 08/11/24 1215 08/11/24 1604 08/11/24 1738 08/11/24 2119 08/12/24 0855  GLUCAP 354* 332* 320* 250* 252*     Recent Results (from the past 240 hours)  Resp panel by RT-PCR (RSV, Flu Effie Janoski&B, Covid) Anterior Nasal Swab      Status: None   Collection Time: 08/10/24  9:27 PM   Specimen: Anterior Nasal Swab  Result Value Ref Range Status   SARS Coronavirus 2 by RT PCR NEGATIVE NEGATIVE Final   Influenza Larrie Fraizer by PCR NEGATIVE NEGATIVE Final   Influenza B by PCR NEGATIVE NEGATIVE Final    Comment: (NOTE) The Xpert Xpress SARS-CoV-2/FLU/RSV plus assay is intended as an aid in the diagnosis of influenza from Nasopharyngeal swab specimens and should not be used as Camari Wisham sole basis for treatment. Nasal washings and aspirates are unacceptable for Xpert Xpress SARS-CoV-2/FLU/RSV testing.  Fact Sheet for Patients: bloggercourse.com  Fact Sheet for Healthcare Providers: seriousbroker.it  This test is not yet approved or cleared by the [place]  FDA and has been authorized for detection and/or diagnosis of SARS-CoV-2 by FDA under  an Emergency Use Authorization (EUA). This EUA will remain in effect (meaning this test can be used) for the duration of the COVID-19 declaration under Section 564(b)(1) of the Act, 21 U.S.C. section 360bbb-3(b)(1), unless the authorization is terminated or revoked.     Resp Syncytial Virus by PCR NEGATIVE NEGATIVE Final    Comment: (NOTE) Fact Sheet for Patients: bloggercourse.com  Fact Sheet for Healthcare Providers: seriousbroker.it  This test is not yet approved or cleared by the [place]  FDA and has been authorized for detection and/or diagnosis of SARS-CoV-2 by FDA under an Emergency Use Authorization (EUA). This EUA will remain in effect (meaning this test can be used) for the duration of the COVID-19 declaration under Section 564(b)(1) of the Act, 21 U.S.C. section 360bbb-3(b)(1), unless the authorization is terminated or revoked.  Performed at [facility], [address], [place], [place] [address]   Group Sahana Boyland Strep by PCR     Status: None   Collection Time:  08/10/24  9:27 PM   Specimen: Anterior Nasal Swab; Sterile Swab  Result Value Ref Range Status   Group Givanni Staron Strep by PCR NOT DETECTED NOT DETECTED Final    Comment: Performed at [facility], [address] [place], [place] [address]  Respiratory (~20 pathogens) panel by PCR     Status: Abnormal   Collection Time: 08/11/24  1:32 AM   Specimen: Nasopharyngeal Swab; Respiratory  Result Value Ref Range Status   Adenovirus NOT DETECTED NOT DETECTED Final   Coronavirus 229E NOT DETECTED NOT DETECTED Final    Comment: (NOTE) The Coronavirus on the Respiratory Panel, DOES NOT test for the novel  Coronavirus (2019 nCoV)    Coronavirus HKU1 NOT DETECTED NOT DETECTED Final   Coronavirus NL63 NOT DETECTED NOT DETECTED Final   Coronavirus OC43 NOT DETECTED NOT DETECTED Final   Metapneumovirus NOT DETECTED NOT DETECTED Final   Rhinovirus / Enterovirus DETECTED (Carrell Rahmani) NOT DETECTED Final   Influenza Alysah Carton NOT DETECTED NOT DETECTED Final   Influenza B NOT DETECTED NOT DETECTED Final   Parainfluenza Virus 1 NOT DETECTED NOT DETECTED Final   Parainfluenza Virus 2 NOT DETECTED NOT DETECTED Final   Parainfluenza Virus 3 NOT DETECTED NOT DETECTED Final   Parainfluenza Virus 4 NOT DETECTED NOT DETECTED Final   Respiratory Syncytial Virus NOT DETECTED NOT DETECTED Final   Bordetella pertussis NOT DETECTED NOT DETECTED Final   Bordetella Parapertussis NOT DETECTED NOT DETECTED Final   Chlamydophila pneumoniae NOT DETECTED NOT DETECTED Final   Mycoplasma pneumoniae NOT DETECTED NOT DETECTED Final    Comment: Performed at [facility], [address] [place], [place] [address]         Radiology Studies: US  EKG SITE RITE Result Date: 08/11/2024 If Site Rite image not attached, placement could not be confirmed due to current cardiac rhythm.  DG Chest 2 View Result Date: 08/10/2024 CLINICAL DATA:  Difficulty breathing EXAM: CHEST - 2 VIEW COMPARISON:  Chest x-ray 03/09/2024.  Chest CT 02/17/2022.  FINDINGS: The heart is enlarged. There central pulmonary vascular congestion. There are patchy airspace opacities in the infrahilar regions bilaterally. Costophrenic angles are clear. No pleural effusion or pneumothorax. No acute fractures are seen. IMPRESSION: 1. Cardiomegaly with central pulmonary vascular congestion. 2. Patchy airspace opacities in the infrahilar regions bilaterally may represent edema or infection. Electronically Signed   By: Greig Pique M.D.   On: 08/10/2024 22:20        Scheduled Meds:  amiodarone   150 mg  Intravenous Once   atorvastatin   20 mg Oral Daily   Chlorhexidine  Gluconate Cloth  6 each Topical Daily   furosemide   80 mg Intravenous BID   insulin  aspart  0-15 Units Subcutaneous TID WC   insulin  aspart  0-5 Units Subcutaneous QHS   insulin  glargine-yfgn  20 Units Subcutaneous Daily   isosorbide  mononitrate  90 mg Oral Daily   potassium chloride  SA  20 mEq Oral Daily   potassium chloride   40 mEq Oral Once   rivaroxaban   20 mg Oral Q supper   sodium chloride  flush  3 mL Intravenous Q12H   Continuous Infusions:  amiodarone      Followed by   amiodarone      DOBUTamine 2.5 mcg/kg/min (08/12/24 0825)     LOS: 2 days    Time spent: over 30 min     Meliton Monte, MD [facility]   To contact the attending provider between 7A-7P or the covering provider during after hours 7P-7A, please log into the web site [email or web address] and access using universal [place] password for that web site. If you do not have the password, please call the hospital operator.  08/12/2024, 11:10 AM

## 2024-08-12 NOTE — Plan of Care (Signed)
   Problem: Coping: Goal: Ability to adjust to condition or change in health will improve Outcome: Progressing

## 2024-08-12 NOTE — Progress Notes (Signed)
 Upon start of shift, patients HR ranging from 130-147's while at rest (sleeping).

## 2024-08-12 NOTE — Consult Note (Addendum)
 Advanced Heart Failure Team Consult Note   Primary Physician: Celestia Rosaline SQUIBB, NP Cardiologist:  Shelda Bruckner, MD AHF: Dr. Rolan   Reason for Consultation: a/c systolic heart failure w/ low output   HPI:    Justin [name] is seen today for evaluation of a/c systolic heart failure w/ low output at the request of Dr. Wendel, Cardiology.   43 y.o. Spanish-speaking male with history of chronic systolic CHF, uncontrolled HTN, DM II, mitral regurgitation, ETOH abuse, hx cocaine use.   Heart failure dates back to 2017. Admitted to [facility] with acute systolic CHF and hypertensive urgency. Echo showed severe LV dsyfunction and severe LVH. Cath with no CAD.   Admitted in 6/23 with acute respiratory failure with hypoxia requiring BiPAP due to CHF. He was diuresed and initiated on GDMT. LHC showed no significant CAD.  Cardiac MRI in 6/23 showed LVEF 29%, LV severely dilated, asymmetric LVH with basal septum measuring 17 mm, RVEF 45%, patchy LGE in septum and RV insertion sites + subepicardial LGE in inferolateral wall. Scar pattern and asymmetric hypertrophy could be consistent with hypertrophic cardiomyopathy. However, due to subepicardial LGE in inferolateral wall and presence of mediastinal lymphadenopathy, cardiac PET recommended to rule out cardiac sarcoidosis. After discharge he no-showed TOC and cardiology clinic follow-ups. Never completed PET.    Admitted 1/24 and 7/24 for CHF exacerbation and volume overload requiring IV diuresis due to noncompliance with medications. Now showed post hospital follow up with Advanced Heart Failure Clinic and Cardiology.   Echo 6/24 showed EF 20-25% gHK, GIIDD, RV mod reduced, mild MVR, IVC dilated.   Admitted 12/24 with A/C HFrEF in the setting of noncompliance. Diuresed and GDMT restarted.    Admitted in 7/25 with CHF exacerbation.  He had been off cardiac meds. Echo  EF 20-25%, moderate LVH, LV with GHK, RA  severely reduced, LA/RA severely dilated, mod-severe MR, mild-mod TR. Cardiorenal syndrome with creatinine > 3.  He was diuresed and required milrinone  for low output HF.  This was titrated off.  He was started on amiodarone  in setting of atypical atrial flutter.  He was cardioverted back to NSR this admission.  TEE showed EF 20-25%, mild LV dilation, mild LVH, posterior mitral leaflet restriction with severe functional MR, MR ERO 0.6 cm^2 by PISA. He did show for his post hospital f/u after this admission but failed to show for subsequent visits.   Now readmitted w/ a/c CHF w/ marked volume overload and low output and was back in AFL w/ RVR. Initial co-ox 47%. SCr 2.06 (b/l 2.4). He was started on DBA 2.5.  Repeat co-ox pending. AHF team asked to assist w/ further management.   Of note, pt had 1.3 L in UOP yesterday w/ IV Lasix . SCr 2.06>>2.09>>2.22 today. K 3.6  Respiratory panel + for rinovirus/enterovirus.   UDS negative.    Home Medications Prior to Admission medications   Medication Sig Start Date End Date Taking? Authorizing Provider  amiodarone  (PACERONE ) 200 MG tablet Take 1 tablet (200 mg total) by mouth daily. 03/24/24  Yes Rolan Ezra RAMAN, MD  atorvastatin  (LIPITOR) 20 MG tablet Take 1 tablet (20 mg total) by mouth daily. 03/14/24  Yes Lee, Jordan, NP  midodrine (PROAMATINE) 5 MG tablet Take 5 mg by mouth daily. Do not give after 6pm   Yes [provider]  rivaroxaban  (XARELTO ) 20 MG TABS tablet Take 20 mg by mouth daily with supper.   Yes [provider]  empagliflozin  (JARDIANCE )  10 MG TABS tablet Take 1 tablet (10 mg total) by mouth daily. 03/19/24   Smucker, Melvenia, MD  glimepiride  (AMARYL ) 2 MG tablet Take 2 mg by mouth daily. 03/06/24   [provider]  hydrALAZINE  (APRESOLINE ) 100 MG tablet Take 1 tablet (100 mg total) by mouth with breakfast, with lunch, and with evening meal. 03/19/24     isosorbide  mononitrate (IMDUR ) 30 MG 24 hr tablet Take 3 tablets  (90 mg total) by mouth daily. 03/19/24     metoprolol  succinate (TOPROL  XL) 50 MG 24 hr tablet Take 1 tablet (50 mg total) by mouth daily. Take with or immediately following a meal. 04/10/24   Rolan Ezra RAMAN, MD  potassium chloride  SA (KLOR-CON  M) 20 MEQ tablet Take 1 tablet (20 mEq total) by mouth daily. 03/20/24     Rivaroxaban  (XARELTO ) 15 MG TABS tablet Take 1 tablet (15 mg total) by mouth daily with supper. Patient not taking: Reported on 08/11/2024 03/25/24   Rolan Ezra RAMAN, MD  torsemide  (DEMADEX ) 20 MG tablet Take 4 tablets in the morning (80mg  total) and 2 tablets (40mg  total) in the evening. 03/19/24       Past Medical History: Past Medical History:  Diagnosis Date   Heart failure with reduced ejection fraction (HCC) 2017   [facility]   Hypertension 2017   [facility]    Past Surgical History: Past Surgical History:  Procedure Laterality Date   CARDIOVERSION N/A 03/17/2024   Procedure: CARDIOVERSION;  Surgeon: Rolan Ezra RAMAN, MD;  Location: [facility] INVASIVE CV LAB;  Service: Cardiovascular;  Laterality: N/A;   RIGHT/LEFT HEART CATH AND CORONARY ANGIOGRAPHY N/A 02/16/2022   Procedure: RIGHT/LEFT HEART CATH AND CORONARY ANGIOGRAPHY;  Surgeon: Wonda Sharper, MD;  Location: [facility] INVASIVE CV LAB;  Service: Cardiovascular;  Laterality: N/A;   TRANSESOPHAGEAL ECHOCARDIOGRAM (CATH LAB) N/A 03/17/2024   Procedure: TRANSESOPHAGEAL ECHOCARDIOGRAM;  Surgeon: Rolan Ezra RAMAN, MD;  Location: [facility] INVASIVE CV LAB;  Service: Cardiovascular;  Laterality: N/A;    Family History: Family History  Problem Relation Age of Onset   Hypertension Father     Social History: Social History   Socioeconomic History   Marital status: Single    Spouse name: Not on file   Number of children: 3   Years of education: Not on file   Highest education level: 7th grade  Occupational History   Occupation: education administrator    Comment: varies  Tobacco Use   Smoking status: Never   Smokeless tobacco:  Never  Vaping Use   Vaping status: Never Used  Substance and Sexual Activity   Alcohol use: Yes    Alcohol/week: 30.0 standard drinks of alcohol    Types: 30 Cans of beer per week    Comment: occ   Drug use: Not Currently    Types: Crack cocaine, Cocaine    Comment: 2 weeks   Sexual activity: Not on file  Other Topics Concern   Not on file  Social History Narrative   Not on file   Social Drivers of Health   Financial Resource Strain: Not at Risk (05/07/2024)   Received from [employer]    How hard is it for you to pay for the very basics like food, housing, heating, medical care, and medications?: 1  Food Insecurity: Food Insecurity Present (08/11/2024)   Hunger Vital Sign    Worried About Running Out of Food in the Last Year: Sometimes true    Ran Out of Food  in the Last Year: Sometimes true  Transportation Needs: No Transportation Needs (08/11/2024)   PRAPARE - Administrator, Civil Service (Medical): No    Lack of Transportation (Non-Medical): No  Physical Activity: At Risk (05/07/2024)   Received from [facility]   Physical Activity    On average, how many minutes do you engage in exercise at this level?: 2  Stress: Not at Risk (05/07/2024)   Received from [facility]   Stress    Do you feel these kinds of stress these days?: 1  Social Connections: Not at Risk (05/07/2024)   Received from [facility]   Social Connections    How often do you see or talk to people that you care about and feel close to? (For example: talking to friends on phone, visiting friends or family, going to church or club meetings): 1    Allergies:  No Known Allergies  Objective:    Vital Signs:   Temp:  [97.8 F (36.6 C)-98.8 F (37.1 C)] 98.2 F (36.8 C) (12/02 0515) Pulse Rate:  [68-128] 126 (12/02 0300) Resp:  [17-25] 25 (12/02 0729) BP: (85-158)/(68-129) 132/86 (12/02 0700) SpO2:  [92 %-96 %] 96 % (12/02 0729) Last BM Date : 08/11/24  Weight change: Filed Weights    08/10/24 2135 08/11/24 0517  Weight: 108 kg 127.8 kg    Intake/Output:   Intake/Output Summary (Last 24 hours) at 08/12/2024 0828 Last data filed at 08/12/2024 9364 Gross per 24 hour  Intake 1470.71 ml  Output 1310 ml  Net 160.71 ml      Physical Exam    General:  fatigued appearing. No resp difficulty Neck: thick neck, JVD up to jaw/ear  Cor: Irregular rhythm and rate. No rubs, gallops or murmurs. Lungs: diminished at the bases bilaterally  Abdomen: obese, markedly distended. NT Extremities: no cyanosis, clubbing, rash. 2+ b/l LEE  Neuro: alert & orientedx3, cranial nerves grossly intact. moves all 4 extremities w/o difficulty. Affect pleasant   Telemetry   AFL 130s, personally reviewed   EKG    N/A  Labs   Basic Metabolic Panel: Recent Labs  Lab 08/10/24 2148 08/11/24 0631 08/12/24 0448  NA 132* 131* 132*  K 3.3* 3.7 3.6  CL 96* 95* 96*  CO2 23 23 32  GLUCOSE 416* 373* 192*  BUN 27* 25* 28*  CREATININE 2.06* 2.09* 2.22*  CALCIUM  8.8* 8.1* 8.5*  MG  --  1.7  --     Liver Function Tests: Recent Labs  Lab 08/11/24 2022  AST 25  ALT 22  ALKPHOS 88  BILITOT 0.6  PROT 7.0  ALBUMIN 2.9*   No results for input(s): LIPASE, AMYLASE in the last 168 hours. No results for input(s): AMMONIA in the last 168 hours.  CBC: Recent Labs  Lab 08/10/24 2148 08/11/24 0631 08/12/24 0448  WBC 11.5* 11.1* 7.1  HGB 16.8 16.4 15.9  HCT 50.5 48.8 47.6  MCV 90.2 90.0 91.0  PLT 228 212 190    Cardiac Enzymes: No results for input(s): CKTOTAL, CKMB, CKMBINDEX, TROPONINI in the last 168 hours.  BNP: BNP (last 3 results) Recent Labs    03/09/24 1845 03/24/24 1637 08/10/24 2148  BNP 2,578.5* 1,035.3* 193.5*    ProBNP (last 3 results) No results for input(s): PROBNP in the last 8760 hours.   CBG: Recent Labs  Lab 08/11/24 0806 08/11/24 1215 08/11/24 1604 08/11/24 1738 08/11/24 2119  GLUCAP 362* 354* 332* 320* 250*     Coagulation Studies: No results for input(s):  LABPROT, INR in the last 72 hours.   Imaging   US  EKG SITE RITE Result Date: 08/11/2024 If Site Rite image not attached, placement could not be confirmed due to current cardiac rhythm.    Medications:     Current Medications:  amiodarone   200 mg Oral Daily   atorvastatin   20 mg Oral Daily   Chlorhexidine  Gluconate Cloth  6 each Topical Daily   furosemide   80 mg Intravenous BID   insulin  aspart  0-15 Units Subcutaneous TID WC   insulin  aspart  0-5 Units Subcutaneous QHS   insulin  glargine-yfgn  20 Units Subcutaneous Daily   isosorbide  mononitrate  90 mg Oral Daily   potassium chloride  SA  20 mEq Oral Daily   rivaroxaban   20 mg Oral Q supper   sodium chloride  flush  3 mL Intravenous Q12H    Infusions:  cefTRIAXone  (ROCEPHIN )  IV 2 g (08/12/24 0824)   DOBUTamine 2.5 mcg/kg/min (08/12/24 0825)   doxycycline (VIBRAMYCIN) IV Stopped (08/12/24 0246)      Assessment/Plan   1. Acute on Chronic systolic CHF: Long history of nonischemic cardiomyopathy. Cath in 6/23 with no significant CAD, CI 1.99.  Substance abuse (cocaine and ETOH) thought to play a role in cardiomyopathy, also uncontrolled HTN in the past.  However, cardiac MRI was done in 6/23 showing LVEF 29%, LV severely dilated, asymmetric LVH with basal septum measuring 17 mm, RVEF 45%, patchy LGE in septum and RV insertion sites + subepicardial LGE in inferolateral wall. Scar pattern and asymmetric hypertrophy could be consistent with hypertrophic cardiomyopathy. However, due to subepicardial LGE in inferolateral wall and presence of mediastinal lymphadenopathy, cardiac PET recommended to rule out cardiac sarcoidosis.  This was never done due to lack of insurance.  Echo in 7/25 showed EF 20-25% with moderate concentric LVH, severe RV dysfunction, probably severe functional MR with PISA ERO 0.47 cm^2, IVC dilated.  Low output HF noted at 7/25 admission, he was started on  milrinone  and diuresed, milrinone  eventually discontinued.  Now back w/ a/c CHF w/ low output, NYHA Class IIIb symptoms, in setting of recurrent AFL w/ RVR. Initial Co-ox 47%. Now on DBA 2.5. Repeat Co-ox pending. Remains markedly volume overloaded.  - Continue DBA 2.5 mcg/kg/min until fully diuresed. Check Co-ox - Continue IV Lasix  80 mg bid + 5 mg of metolazone . Set up CVP monitoring  - GDMT limited by CKD stage 4. - Continue Imdur  90 mg daily   - No ? blocker w/ low output  - Would like to get a cardiac PET to rule out cardiac sarcoidosis, but this is probably not an option with no insurance. - Not a good candidate for advanced therapies with noncompliance, lack of insurance, RV dysfunction and CKD stage 4.  2. Atrial fibrillation/atypical flutter: Persistent atypical atrial flutter in 7/25, TEE-guided DCCV back to NSR but back in AFL w/ RVR this admission.   - on PO amiodarone  but may need to switch to IV while on DBA if rates don't improve - Continue Xarelto   - Plan TEE/DCCV after diuresis  - Eventual fibrillation/flutter ablation would be ideal.   3. CKD stage IV: Suspect cardiorenal syndrome + DM2 + HTN.  Last creatinine prior to this admit was 2.4. SCr 2.0 at time of this admit>>2.2 today w/ diuresis  - follow BMP daily   - support CO w/ DBA   4. Mitral regurgitation: Severe functional MR.  TEE 7/25 showed severe eccentric, posteriorly-directed MR with restricted posterior leaflet, MR ERO 0.6 cm^2 by  PISA.  - continue HF/AFL optimization w/ diuresis and repeat DCCV   5. ETOH/cocaine abuse: Says he has quit. UDS negative on admission   6. DM2: Poor control.   -Insulin  per IM   7. Rhinovirus Infection:  - supportive care  8. Presumed CAP - on abx per IM    Length of Stay: 2  Caffie Shed, PA-C  08/12/2024, 8:28 AM    Advanced Heart Failure Team Pager [phone] (M-F; 7a - 5p)  Please contact [facility] for night-coverage after hours (4p -7a ) and weekends on  [email or web address]   Patient seen and examined with the above-signed Advanced Practice Provider and/or Housestaff. I personally reviewed laboratory data, imaging studies and relevant notes. I independently examined the patient and formulated the important aspects of the plan. I have edited the note to reflect any of my changes or salient points. I have personally discussed the plan with the patient and/or family.  43 y/o male with obesity, CKD IV, DM2, PAF, severe systolic HF and medical noncompliance  Admitted with ADHF in setting of rhinovirus and recurrent AF  Initial co-ox low. CVP 15. Started on DBA. Co-ox improved. Modest diuresis on 80 IV lasix  bid  General:  Lying in bed. No resp difficulty HEENT: normal Neck: supple. JVP elevated Cor: Irreg Lungs: clear Abdomen: obese soft, nontender, + distended.Good bowel sounds. Extremities: no cyanosis, clubbing, rash, 2+ edema Neuro: alert & orientedx3, cranial nerves grossly intact. moves all 4 extremities w/o difficulty. Affect pleasant  He has low output HF. Co-ox improved with DBA. Will continue IV lasix . Add metolazone .   Continue IV amio. Once euvolemic will need TEE/DC-CV  Not candidate for advanced therapies.   Toribio Fuel, MD  10:01 AM

## 2024-08-12 NOTE — Progress Notes (Signed)
 Patient arrived to unit.  VSS, no c/o pain.  CVP monitoring initiated with an initial reading of 15

## 2024-08-13 LAB — BASIC METABOLIC PANEL WITH GFR
Anion gap: 13 (ref 5–15)
BUN: 30 mg/dL — ABNORMAL HIGH (ref 6–20)
CO2: 28 mmol/L (ref 22–32)
Calcium: 8.9 mg/dL (ref 8.9–10.3)
Chloride: 95 mmol/L — ABNORMAL LOW (ref 98–111)
Creatinine, Ser: 2.04 mg/dL — ABNORMAL HIGH (ref 0.61–1.24)
GFR, Estimated: 40 mL/min — ABNORMAL LOW (ref >60)
Glucose, Bld: 170 mg/dL — ABNORMAL HIGH (ref 70–99)
Potassium: 3 mmol/L — ABNORMAL LOW (ref 3.5–5.1)
Sodium: 136 mmol/L (ref 135–145)

## 2024-08-13 LAB — CBC
HCT: 46 % (ref 39.0–52.0)
Hemoglobin: 15.5 g/dL (ref 13.0–17.0)
MCH: 30.3 pg (ref 26.0–34.0)
MCHC: 33.7 g/dL (ref 30.0–36.0)
MCV: 90 fL (ref 80.0–100.0)
Platelets: 185 K/uL (ref 150–400)
RBC: 5.11 MIL/uL (ref 4.22–5.81)
RDW: 13.4 % (ref 11.5–15.5)
WBC: 4.9 K/uL (ref 4.0–10.5)
nRBC: 0 % (ref 0.0–0.2)

## 2024-08-13 LAB — GLUCOSE, CAPILLARY
Glucose-Capillary: 196 mg/dL — ABNORMAL HIGH (ref 70–99)
Glucose-Capillary: 242 mg/dL — ABNORMAL HIGH (ref 70–99)
Glucose-Capillary: 256 mg/dL — ABNORMAL HIGH (ref 70–99)
Glucose-Capillary: 236 mg/dL — ABNORMAL HIGH (ref 70–99)

## 2024-08-13 LAB — MAGNESIUM: Magnesium: 1.9 mg/dL (ref 1.7–2.4)

## 2024-08-13 LAB — COOXEMETRY PANEL
Carboxyhemoglobin: 1.7 % — ABNORMAL HIGH (ref 0.5–1.5)
Methemoglobin: <0.7 % (ref 0.0–1.5)
O2 Saturation: 65.9 %
Total hemoglobin: 16.1 g/dL — ABNORMAL HIGH (ref 12.0–16.0)

## 2024-08-13 MED ORDER — MAGNESIUM SULFATE 2 GM/50ML IV SOLN
2.0000 g | Freq: Once | INTRAVENOUS | Status: AC
  Administered 2024-08-13: 2 g via INTRAVENOUS
  Filled 2024-08-13: qty 50

## 2024-08-13 MED ORDER — HYDRALAZINE HCL 25 MG PO TABS
25.0000 mg | ORAL_TABLET | Freq: Three times a day (TID) | ORAL | Status: DC
  Administered 2024-08-13 – 2024-08-15 (×6): 25 mg via ORAL
  Filled 2024-08-13 (×6): qty 1

## 2024-08-13 MED ORDER — POTASSIUM CHLORIDE CRYS ER 20 MEQ PO TBCR
40.0000 meq | EXTENDED_RELEASE_TABLET | ORAL | Status: AC
  Administered 2024-08-13 (×2): 40 meq via ORAL
  Filled 2024-08-13 (×2): qty 2

## 2024-08-13 MED ORDER — METOLAZONE 5 MG PO TABS: 5.0000 mg | ORAL_TABLET | Freq: Once | ORAL | Status: DC

## 2024-08-13 MED ORDER — INSULIN ASPART 100 UNIT/ML IJ SOLN
5.0000 [IU] | Freq: Three times a day (TID) | INTRAMUSCULAR | Status: DC
  Administered 2024-08-13 – 2024-08-14 (×4): 5 [IU] via SUBCUTANEOUS
  Filled 2024-08-13 (×4): qty 5

## 2024-08-13 NOTE — Progress Notes (Addendum)
 Cardiology Admission History and Physical:    Patient ID: Justin [name] MRN: [id number]; DOB: 1981/01/01   Admission date: 08/10/2024  Primary Care Provider: Celestia Rosaline SQUIBB, NP Heart failure Cardiologist: [name], MD Cardiologist:  Shelda Bruckner, MD     Electrophysiologist:  Soyla [name] Norton, MD     Chief Complaint:  Shortness of breath, leg swelling, cough  Patient Profile:   Justin [name] is a 43 y.o. male with PMH of HFrEF (20-25% on 03/2024), NICMO, afibrillation and flutter on Xarelto , mitral regurgitation HTN, alcohol and cocaine abuse in remission, TIIDM, CKD 3b who is being seen for the evaluation of heart failure management. He is admitted for heart failure exacerbation.   Interval history:   Justin [name] this morning had no new concerns. States that his shortness of breath and swelling have improved. He denies chest pain. He endorses good appetite. He reports good urinary output. He reports that he is sleepy this morning but does not have any cough.  Seen by heart failure team yesterday and was started on Metolazone  in addition to IV lasix  80mg  BID and dobutamine.   Unable to obtain CVP this morning.  Physical Exam/Data:   Vitals:   08/12/24 1953 08/13/24 0016 08/13/24 0327 08/13/24 0700  BP: (!) 127/91 (!) 137/91 (!) 148/103 (!) 153/94  Pulse: (!) 117 (!) 115 (!) 120 (!) 58  Resp: 20 20 18 18   Temp: (!) 97.4 F (36.3 C) 98.3 F (36.8 C) 97.8 F (36.6 C) 98 F (36.7 C)  TempSrc: Oral Oral Oral Oral  SpO2: 96% 96% 95% 99%  Weight:   125.7 kg   Height:        Intake/Output Summary (Last 24 hours) at 08/13/2024 0819 Last data filed at 08/13/2024 0600 Gross per 24 hour  Intake 1252.54 ml  Output 2900 ml  Net -1647.46 ml      08/13/2024    3:27 AM 08/12/2024   10:19 AM 08/11/2024    5:17 AM  Last 3 Weights  Weight (lbs) 277 lb 3.2 oz 282 lb 4.8 oz 281 lb 11.2 oz  Weight (kg) 125.737 kg  128.05 kg 127.778 kg     Body mass index is 44.74 kg/m.   Physical Exam Constitutional:      Appearance: Normal appearance. He is obese.  Cardiovascular:     Rate and Rhythm: Regular rhythm. Tachycardia present.     Heart sounds: No murmur heard.    No friction rub. No gallop.  Pulmonary:     Effort: Pulmonary effort is normal.     Comments: Crackles at the bilateral bases Abdominal:     General: Bowel sounds are normal.     Tenderness: There is no abdominal tenderness.  Musculoskeletal:     Comments: Bilateral trace edema  Neurological:     Mental Status: He is alert.       Relevant CV Studies: TEE 7/7  1Left ventricular ejection fraction, by estimation, is 20 to 25%. The  left ventricle has severely decreased function. The left ventricle  demonstrates global hypokinesis. The left ventricular internal cavity size  was mildly dilated. There is mild  concentric left ventricular hypertrophy.   2. Peak RV-RA gradient 30 mmHg. Right ventricular systolic function is  moderately reduced. The right ventricular size is normal.   3. Left atrial size was moderately dilated. No left atrial/left atrial  appendage thrombus was detected.   4. Right atrial size was mildly dilated.   5. No  PFO or ASD by color doppler or bubbles.   6. The aortic valve is tricuspid. Aortic valve regurgitation is not  visualized. No aortic stenosis is present.   7. There was eccentric, posteriorly-directed mitral regurgitation with  restricted posterior mitral leaflet. MR appeared severe with PISA ERO 0.6  cm^2. There was systolic flow reversal noted in the pulmonary vein doppler  pattern. No evidence of mitral  stenosis. The mean mitral valve gradient is 3.0 mmHg.   8. 3D performed of the mitral valve.    Right/Left heart cath 6/23 Patent coronary arteries with minimal luminal irregularity Low cardiac filling pressures (PCWP mean of 3 mmHg) with borderline cardiac output (CO 4.2 L/m and CI  2.0)  Laboratory Data:  Chemistry Recent Labs  Lab 08/12/24 0448 08/13/24 0508  NA 132* 136  K 3.6 3.0*  CL 96* 95*  CO2 32 28  GLUCOSE 192* 170*  BUN 28* 30*  CREATININE 2.22* 2.04*  CALCIUM  8.5* 8.9  GFRNONAA 37* 40*  ANIONGAP 4* 13    Recent Labs  Lab 08/11/24 2022  PROT 7.0  ALBUMIN 2.9*  AST 25  ALT 22  ALKPHOS 88  BILITOT 0.6   Hematology Recent Labs  Lab 08/12/24 0448 08/13/24 0508  WBC 7.1 4.9  RBC 5.23 5.11  HGB 15.9 15.5  HCT 47.6 46.0  MCV 91.0 90.0  MCH 30.4 30.3  MCHC 33.4 33.7  RDW 13.7 13.4  PLT 190 185   Cardiac EnzymesNo results for input(s): TROPONINI in the last 168 hours. No results for input(s): TROPIPOC in the last 168 hours.  BNP Recent Labs  Lab 08/10/24 2148  BNP 193.5*    DDimer No results for input(s): DDIMER in the last 168 hours.  Radiology/Studies:  No results found.   Assessment and Plan:   Low output acute on chronic heart failure exacerbation HFrEF (20-25%) secondary to [facility] History of recurrent heart failure exacerbations. Found to be in low-output state via low co-oximetry on 12/1. Down 2kgs this admission. Urine output yesterday with -1.6L net. On dobutamine , metolazone , and Lasix . CrCl improved this morning from 2.22 yesterday to 2.04 today.   -Heart failure team following -Cooximetry and CVP daily -Hold Metoprolol  given low output state -Dobutamine  2.5mcg/kg/min -Continue Metolazone  5mg  daily -Lasix  80mg  IV BID -Continue Imdur  90mg  daily -Keep K >4 -Keep Mag>2 -Daily BMP and magnesium , Follow creatinine. -Daily weights -Strict Is and Os -Cardiac PET to evaluate for sarcoidosis in the outpatient setting, may be cost-prohibitive -F/U with advanced heart failure outpatient.   Atrial Flutter with RVR S/P cardioversion at recent hospitalization. NSR at outpatient follow up. 2:1 A-flutter on EKG and telemetry now. Rates in the 120-130s. Transitioned to IV amiodarone  yesterday.   -IV amiodarone   30mg /hr -Hold Metoprolol  -Xarelto  20mg  daily (Creatinine clearance currently greater than 50) reduce dose to 15mg  if CKD worsens -Allow tachycardia to compensate for low EF   Hypertension Mildly elevated on admission to 159/95. Home medication Hydralazine  100mg  TID, Imdur  90 daily. Likely taking these medications each once daily. Hydralazine  held in the setting of normal BP with Imdur  and metoprolol . BP 153/94 this morning, prior to Imdur  administration.   -Imdur  90mg  daily -Holding Hydralazine  -Diuresis as above   CKD III vs AKI Unclear baseline creatinine. Improved flat with diuresis overall and improved since yesterday. Creatinine 2.04 today.  -DBA and furosemide  as above -Creatinine daily    Per primary team T2DM Obesity    For questions or updates, please contact [facility] Please consult [email or web address]  for contact info under     Signed, Melvenia Morrison, MD  08/13/2024 8:19 AM     ATTENDING ATTESTATION:  After conducting a review of all available clinical information with the care team, interviewing the patient, and performing a physical exam, I agree with the findings and plan described in this note.   GEN: No acute distress, AO x 3 HEENT:  MMM, unable to assess JVP due to neck girth Cardiac: RRR, no murmurs, rubs, or gallops.  Respiratory: Clear to auscultation bilaterally. GI: Soft, nontender, non-distended  MS: No edema; No deformity. Neuro:  Nonfocal  Vasc:  +2 radial pulses; LE feel warm  Patient doing well today with brisk diuresis overnight.  Cr remains stable.  Unable to assess CVP at bedside today but clinically improved, able to lay supine.  Remains in atrial flutter.  Appreciate HF input.  Continue intensive diuresis, watch for BUN/Cr/HCO3 rise then convert to PO lasix .  Planning TEE CV Friday.  Lurena Red, MD Pager [phone]

## 2024-08-13 NOTE — Inpatient Diabetes Management (Addendum)
 Inpatient Diabetes Program Recommendations  AACE/ADA: New Consensus Statement on Inpatient Glycemic Control (2015)  Target Ranges:  Prepandial:   less than 140 mg/dL      Peak postprandial:   less than 180 mg/dL (1-2 hours)      Critically ill patients:  140 - 180 mg/dL    Latest Reference Range & Units 03/10/24 00:42 08/11/24 06:31  Hemoglobin A1C 4.8 - 5.6 % 10.3 (H) 13.9 (H)  352 mg/dl  (H): Data is abnormally high  Latest Reference Range & Units 08/12/24 08:55 08/12/24 12:28 08/12/24 16:55 08/12/24 21:19  Glucose-Capillary 70 - 99 mg/dL 747 (H)  8 units Novolog   20 units Semglee   370 (H)  11 units Novolog   320 (H)  12 units Novolog   341 (H)  4 units Novolog    (H): Data is abnormally high  Latest Reference Range & Units 08/13/24 06:26  Glucose-Capillary 70 - 99 mg/dL 803 (H)  (H): Data is abnormally high    Admit with:  Community acquired pneumonia  Acute Hypoxic Respiratory Failure in the setting of  Acute Systolic Heart Failure Exacerbation  Pulmonary Edema  Rhinovirus Infection   History: DM, CHF, CKD  Home DM Meds: Jardiance  10 mg daily        Amaryl  2 mg daily        Trulicity  0.75 mg Qweek (??)         Current Orders: Semglee  20 units daily     Novolog  Moderate Correction Scale/ SSI (0-15 units) TID AC + HS     Novolog  4 units TID with meals   MD- Please consider:  1. Increase Semglee  to 22 units daily  2. Increase Novolog  Meal Coverage to 6 units TID with meals   PCP: Johnie Prescott, PA with [facility] Last Seen 05/28/2024   Addendum 11:45am--Met w/ pt at bedside using iPad interpreter machine [facility] #[id number]) and then switched to another interpreter because we lost connection with the first Vella [phone]).  Pt could NOT tell me the names of his diabetes meds--Told me he does not take any injections at home, even though his last PCP visit in Sept with Johnie Prescott, PA with [facility] had Trulicity  and  Jardiance  listed.  Does NOT have CBG meter at home so not checking CBGs.  I called pt's PCP office and left VM asking them to call me back to review home DM meds.  Spoke with patient about his current A1c of 13.9%.  Explained what an A1c is and what it measures.  Reminded patient that his goal A1c is 7% or less per ADA standards to prevent both acute and long-term complications.  Explained to patient the extreme importance of good glucose control at home.  Encouraged patient to check his CBGs at least TID AC at home and to record all CBGs in a logbook for his PCP to review.  Discussed basic pathophysiology of DM Type 2, basic home care, basic diabetes diet nutrition principles, importance of checking CBGs and maintaining good CBG control to prevent long-term and short-term complications.  Reviewed signs and symptoms of hyperglycemia and hypoglycemia and how to treat hypoglycemia at home.  Also reviewed blood sugar goals and A1c goals for home.    Discussed with pt that MD will likely want to send pt home on Insulin .  Pt agreeable to use insulin .  Educated patient on insulin  pen use at home.  Reviewed all steps of insulin  pen including attachment of needle, 2-unit air shot, dialing up dose,  giving injection, rotation of injection sites, removing needle, disposal of sharps, storage of unused insulin , disposal of insulin  etc.  Patient able to provide successful return demonstration.  Reviewed troubleshooting with insulin  pen.  Have asked RNs caring for patient to please allow patient to give all injections here in hospital as much as possible for practice.  MD to give patient Rxs for insulin  pens and insulin  pen needles.  Have also ordered RD consult for DM diet education while pt in hospital.       --Will follow patient during hospitalization--  Adina Rudolpho Arrow RN, MSN, CDCES Diabetes Coordinator Inpatient Glycemic Control Team Team Pager: [phone] (8a-5p)

## 2024-08-13 NOTE — Plan of Care (Signed)
   Problem: Education: Goal: Ability to describe self-care measures that may prevent or decrease complications (Diabetes Survival Skills Education) will improve Outcome: Progressing   Problem: Coping: Goal: Ability to adjust to condition or change in health will improve Outcome: Progressing   Problem: Fluid Volume: Goal: Ability to maintain a balanced intake and output will improve Outcome: Progressing

## 2024-08-13 NOTE — Plan of Care (Signed)
   Problem: Education: Goal: Ability to describe self-care measures that may prevent or decrease complications (Diabetes Survival Skills Education) will improve Outcome: Progressing Goal: Individualized Educational Video(s) Outcome: Progressing   Problem: Coping: Goal: Ability to adjust to condition or change in health will improve Outcome: Progressing

## 2024-08-13 NOTE — Progress Notes (Signed)
 PROGRESS NOTE    Justin [name]  FMW:[id number] DOB: October 04, 1980 DOA: 08/10/2024 PCP: Celestia Rosaline SQUIBB, NP  [phone] with chronic systolic CHF, hypertension, type 2 diabetes mellitus, EtOH use, history of cocaine abuse multiple hospitalizations with CHF exacerbation, recently hospitalized 7/25 with CHF after being off cardiac meds, developed low output and cardiorenal syndrome then required milrinone  temporarily in the hospital.  Now readmitted with volume overload, low output, atrial flutter with RVR. - Initial Co. ox was 47 with creatinine of 2, heart failure team consulted he was started on dobutamine and Lasix  - Respiratory virus panel positive for rhinovirus   Subjective: -Feels better overall  Assessment and Plan:  Acute on chronic systolic CHF, BiV failure Severe mitral regurgitation -known NICM, no significant CAD on cath 6/23 - Cardiomyopathy felt to be related to EtOH, cocaine and uncontrolled hypertension - Recent echo 7/25 noted EF 20-25%, moderate LVH, severely reduced RV, severe MR - Recent admission with low output required milrinone  - Initial Co. ox 47, heart failure team consulting, now on low-dose dobutamine - Continue IV Lasix , metolazone , Imdur  - GDMT limited by CKD 4  Persistent atrial flutters/fibrillation - Continue oral amiodarone , Xarelto  - Plan for DCCV after diuresis  CKD 4 - Baseline creatinine around 2, - Relatively stable, continue dobutamine, diuretics  Severe mitral regurgitation - Felt to be functional MR, TEE 7/25 noted severe eccentric posterior directed MR  History of EtOH and cocaine use - Allegedly has quit  URI, rhinovirus - Supportive care  Type 2 diabetes mellitus - Continue glargine and meal coverage insulin , dose increased  Obesity, BMI is 45   DVT prophylaxis: Code Status:  Family Communication: Disposition Plan:   Consultants:    Procedures:   Antimicrobials:    Objective: Vitals:   08/12/24  1953 08/13/24 0016 08/13/24 0327 08/13/24 0700  BP: (!) 127/91 (!) 137/91 (!) 148/103 (!) 153/94  Pulse: (!) 117 (!) 115 (!) 120 (!) 58  Resp: 20 20 18 18   Temp: (!) 97.4 F (36.3 C) 98.3 F (36.8 C) 97.8 F (36.6 C) 98 F (36.7 C)  TempSrc: Oral Oral Oral Oral  SpO2: 96% 96% 95% 99%  Weight:   125.7 kg   Height:        Intake/Output Summary (Last 24 hours) at 08/13/2024 0920 Last data filed at 08/13/2024 0600 Gross per 24 hour  Intake 1252.54 ml  Output 2900 ml  Net -1647.46 ml   Filed Weights   08/11/24 0517 08/12/24 1019 08/13/24 0327  Weight: 127.8 kg 128.1 kg 125.7 kg    Examination:  General exam: Appears calm and comfortable  HEENT: Neck obese unable to assess JVD Respiratory system: Few scattered rhonchi Cardiovascular system: S1 & S2 heard, irregular rhythm Abd: nondistended, soft and nontender.Normal bowel sounds heard. Central nervous system: Alert and oriented. No focal neurological deficits. Extremities: Trace edema Skin: No rashes Psychiatry:  Mood & affect appropriate.     Data Reviewed:   CBC: Recent Labs  Lab 08/10/24 2148 08/11/24 0631 08/12/24 0448 08/13/24 0508  WBC 11.5* 11.1* 7.1 4.9  HGB 16.8 16.4 15.9 15.5  HCT 50.5 48.8 47.6 46.0  MCV 90.2 90.0 91.0 90.0  PLT 228 212 190 185   Basic Metabolic Panel: Recent Labs  Lab 08/10/24 2148 08/11/24 0631 08/12/24 0448 08/13/24 0508  NA 132* 131* 132* 136  K 3.3* 3.7 3.6 3.0*  CL 96* 95* 96* 95*  CO2 23 23 32 28  GLUCOSE 416* 373* 192* 170*  BUN 27* 25*  28* 30*  CREATININE 2.06* 2.09* 2.22* 2.04*  CALCIUM  8.8* 8.1* 8.5* 8.9  MG  --  1.7 2.0 1.9   GFR: Estimated Creatinine Clearance: 57.9 mL/min (A) (by C-G formula based on SCr of 2.04 mg/dL (H)). Liver Function Tests: Recent Labs  Lab 08/11/24 2022  AST 25  ALT 22  ALKPHOS 88  BILITOT 0.6  PROT 7.0  ALBUMIN 2.9*   No results for input(s): LIPASE, AMYLASE in the last 168 hours. No results for input(s): AMMONIA in  the last 168 hours. Coagulation Profile: No results for input(s): INR, PROTIME in the last 168 hours. Cardiac Enzymes: No results for input(s): CKTOTAL, CKMB, CKMBINDEX, TROPONINI in the last 168 hours. BNP (last 3 results) No results for input(s): PROBNP in the last 8760 hours. HbA1C: Recent Labs    08/11/24 0631  HGBA1C 13.9*   CBG: Recent Labs  Lab 08/12/24 0855 08/12/24 1228 08/12/24 1655 08/12/24 2119 08/13/24 0626  GLUCAP 252* 370* 320* 341* 196*   Lipid Profile: No results for input(s): CHOL, HDL, LDLCALC, TRIG, CHOLHDL, LDLDIRECT in the last 72 hours. Thyroid  Function Tests: No results for input(s): TSH, T4TOTAL, FREET4, T3FREE, THYROIDAB in the last 72 hours. Anemia Panel: No results for input(s): VITAMINB12, FOLATE, FERRITIN, TIBC, IRON, RETICCTPCT in the last 72 hours. Urine analysis:    Component Value Date/Time   COLORURINE YELLOW 03/10/2024 0042   APPEARANCEUR CLEAR 03/10/2024 0042   LABSPEC 1.011 03/10/2024 0042   PHURINE 5.0 03/10/2024 0042   GLUCOSEU NEGATIVE 03/10/2024 0042   HGBUR NEGATIVE 03/10/2024 0042   BILIRUBINUR NEGATIVE 03/10/2024 0042   KETONESUR NEGATIVE 03/10/2024 0042   PROTEINUR 100 (A) 03/10/2024 0042   NITRITE NEGATIVE 03/10/2024 0042   LEUKOCYTESUR NEGATIVE 03/10/2024 0042   Sepsis Labs: @LABRCNTIP (procalcitonin:4,lacticidven:4)  ) Recent Results (from the past 240 hours)  Resp panel by RT-PCR (RSV, Flu A&B, Covid) Anterior Nasal Swab     Status: None   Collection Time: 08/10/24  9:27 PM   Specimen: Anterior Nasal Swab  Result Value Ref Range Status   SARS Coronavirus 2 by RT PCR NEGATIVE NEGATIVE Final   Influenza A by PCR NEGATIVE NEGATIVE Final   Influenza B by PCR NEGATIVE NEGATIVE Final    Comment: (NOTE) The Xpert Xpress SARS-CoV-2/FLU/RSV plus assay is intended as an aid in the diagnosis of influenza from Nasopharyngeal swab specimens and should not be used as a sole  basis for treatment. Nasal washings and aspirates are unacceptable for Xpert Xpress SARS-CoV-2/FLU/RSV testing.  Fact Sheet for Patients: bloggercourse.com  Fact Sheet for Healthcare Providers: seriousbroker.it  This test is not yet approved or cleared by the [place]  FDA and has been authorized for detection and/or diagnosis of SARS-CoV-2 by FDA under an Emergency Use Authorization (EUA). This EUA will remain in effect (meaning this test can be used) for the duration of the COVID-19 declaration under Section 564(b)(1) of the Act, 21 U.S.C. section 360bbb-3(b)(1), unless the authorization is terminated or revoked.     Resp Syncytial Virus by PCR NEGATIVE NEGATIVE Final    Comment: (NOTE) Fact Sheet for Patients: bloggercourse.com  Fact Sheet for Healthcare Providers: seriousbroker.it  This test is not yet approved or cleared by the [place]  FDA and has been authorized for detection and/or diagnosis of SARS-CoV-2 by FDA under an Emergency Use Authorization (EUA). This EUA will remain in effect (meaning this test can be used) for the duration of the COVID-19 declaration under Section 564(b)(1) of the Act, 21 U.S.C. section 360bbb-3(b)(1), unless the authorization  is terminated or revoked.  Performed at [facility], [address] [place], [place] [address]   Group A Strep by PCR     Status: None   Collection Time: 08/10/24  9:27 PM   Specimen: Anterior Nasal Swab; Sterile Swab  Result Value Ref Range Status   Group A Strep by PCR NOT DETECTED NOT DETECTED Final    Comment: Performed at [facility], [address], [place], [place] [address]  Respiratory (~20 pathogens) panel by PCR     Status: Abnormal   Collection Time: 08/11/24  1:32 AM   Specimen: Nasopharyngeal Swab; Respiratory  Result Value Ref Range Status   Adenovirus NOT DETECTED NOT  DETECTED Final   Coronavirus 229E NOT DETECTED NOT DETECTED Final    Comment: (NOTE) The Coronavirus on the Respiratory Panel, DOES NOT test for the novel  Coronavirus (2019 nCoV)    Coronavirus HKU1 NOT DETECTED NOT DETECTED Final   Coronavirus NL63 NOT DETECTED NOT DETECTED Final   Coronavirus OC43 NOT DETECTED NOT DETECTED Final   Metapneumovirus NOT DETECTED NOT DETECTED Final   Rhinovirus / Enterovirus DETECTED (A) NOT DETECTED Final   Influenza A NOT DETECTED NOT DETECTED Final   Influenza B NOT DETECTED NOT DETECTED Final   Parainfluenza Virus 1 NOT DETECTED NOT DETECTED Final   Parainfluenza Virus 2 NOT DETECTED NOT DETECTED Final   Parainfluenza Virus 3 NOT DETECTED NOT DETECTED Final   Parainfluenza Virus 4 NOT DETECTED NOT DETECTED Final   Respiratory Syncytial Virus NOT DETECTED NOT DETECTED Final   Bordetella pertussis NOT DETECTED NOT DETECTED Final   Bordetella Parapertussis NOT DETECTED NOT DETECTED Final   Chlamydophila pneumoniae NOT DETECTED NOT DETECTED Final   Mycoplasma pneumoniae NOT DETECTED NOT DETECTED Final    Comment: Performed at [facility], [address] [place], [place] [address]     Radiology Studies: US  EKG SITE RITE Result Date: 08/11/2024 If Site Rite image not attached, placement could not be confirmed due to current cardiac rhythm.    Scheduled Meds:  atorvastatin   20 mg Oral Daily   Chlorhexidine  Gluconate Cloth  6 each Topical Daily   furosemide   80 mg Intravenous BID   insulin  aspart  0-15 Units Subcutaneous TID WC   insulin  aspart  0-5 Units Subcutaneous QHS   insulin  aspart  5 Units Subcutaneous TID WC   insulin  glargine-yfgn  20 Units Subcutaneous Daily   isosorbide  mononitrate  90 mg Oral Daily   potassium chloride  SA  20 mEq Oral Daily   potassium chloride   40 mEq Oral Q4H   rivaroxaban   20 mg Oral Q supper   sodium chloride  flush  3 mL Intravenous Q12H   Continuous Infusions:  amiodarone  30 mg/hr (08/13/24 0523)    DOBUTamine 2.5 mcg/kg/min (08/13/24 0523)   magnesium  sulfate bolus IVPB       LOS: 3 days    Time spent:    Sigurd Pac, MD [facility]   08/13/2024, 9:20 AM

## 2024-08-13 NOTE — Progress Notes (Addendum)
 Advanced Heart Failure Rounding Note  Cardiologist: Shelda Bruckner, MD  AHF: Dr. Rolan  Chief Complaint: a/c systolic heart failure w/ low output    Patient Profile  43 y.o. Spanish-speaking male with history of chronic systolic CHF d/t NICM (prior cardiac MRI concerning for possible sarcoid but pt never completed f/u PET), uncontrolled HTN, DM II, mitral regurgitation, ETOH abuse, hx cocaine use, Afib/AFL and poor compliance, admitted w/ a/c CHF w/ marked volume overload and low output and was back in AFL w/ RVR on admission. Initial co-ox 47%. Started on DBA + amio gtts. Respiratory panel + for rhinovirus.   Subjective:    Remains on DBA 2.5. Co-ox 66%.   2.9L in UOP yesterday. CVP not reading correctly but wt down 5 lb. Remains volume overloaded. Feeling better today but c/w orthopnea and cough.   Scr 2.22>>2.04  K 3.0 Mg 1.9   Continues in AFL 120s.    Objective:   Weight Range: 125.7 kg Body mass index is 44.74 kg/m.   Vital Signs:   Temp:  [97.4 F (36.3 C)-98.3 F (36.8 C)] 98 F (36.7 C) (12/03 0700) Pulse Rate:  [58-129] 58 (12/03 0700) Resp:  [18-23] 18 (12/03 0700) BP: (101-153)/(63-103) 153/94 (12/03 0700) SpO2:  [92 %-99 %] 99 % (12/03 0700) Weight:  [125.7 kg-128.1 kg] 125.7 kg (12/03 0327) Last BM Date : 08/11/24  Weight change: Filed Weights   08/11/24 0517 08/12/24 1019 08/13/24 0327  Weight: 127.8 kg 128.1 kg 125.7 kg    Intake/Output:   Intake/Output Summary (Last 24 hours) at 08/13/2024 9077 Last data filed at 08/13/2024 0600 Gross per 24 hour  Intake 1252.54 ml  Output 2900 ml  Net -1647.46 ml      Physical Exam    General:  sitting up on side of bed No resp difficulty HEENT: Normal Neck: thick neck JVD not well visualized  Cor: Irregular rhythm and tachy rate. No rubs, gallops or murmurs. Lungs: diminished at bases w/ diffuse expiratory rhonchi bilaterally  Abdomen: obese, distended but NT Extremities: No cyanosis,  clubbing, rash, no peripheral edema Neuro: Alert & orientedx3, cranial nerves grossly intact. moves all 4 extremities w/o difficulty. Affect pleasant   Telemetry   AFL 120s vs ST, personally reviewed   EKG    12 lead pending  Labs    CBC Recent Labs    08/12/24 0448 08/13/24 0508  WBC 7.1 4.9  HGB 15.9 15.5  HCT 47.6 46.0  MCV 91.0 90.0  PLT 190 185   Basic Metabolic Panel Recent Labs    87/97/74 0448 08/13/24 0508  NA 132* 136  K 3.6 3.0*  CL 96* 95*  CO2 32 28  GLUCOSE 192* 170*  BUN 28* 30*  CREATININE 2.22* 2.04*  CALCIUM  8.5* 8.9  MG 2.0 1.9   Liver Function Tests Recent Labs    08/11/24 2022  AST 25  ALT 22  ALKPHOS 88  BILITOT 0.6  PROT 7.0  ALBUMIN 2.9*   No results for input(s): LIPASE, AMYLASE in the last 72 hours. Cardiac Enzymes No results for input(s): CKTOTAL, CKMB, CKMBINDEX, TROPONINI in the last 72 hours.  BNP: BNP (last 3 results) Recent Labs    03/09/24 1845 03/24/24 1637 08/10/24 2148  BNP 2,578.5* 1,035.3* 193.5*    ProBNP (last 3 results) No results for input(s): PROBNP in the last 8760 hours.   D-Dimer No results for input(s): DDIMER in the last 72 hours. Hemoglobin A1C Recent Labs    08/11/24 0631  HGBA1C 13.9*   Fasting Lipid Panel No results for input(s): CHOL, HDL, LDLCALC, TRIG, CHOLHDL, LDLDIRECT in the last 72 hours. Thyroid  Function Tests No results for input(s): TSH, T4TOTAL, T3FREE, THYROIDAB in the last 72 hours.  Invalid input(s): FREET3  Other results:   Imaging    No results found.   Medications:     Scheduled Medications:  atorvastatin   20 mg Oral Daily   Chlorhexidine  Gluconate Cloth  6 each Topical Daily   furosemide   80 mg Intravenous BID   insulin  aspart  0-15 Units Subcutaneous TID WC   insulin  aspart  0-5 Units Subcutaneous QHS   insulin  aspart  5 Units Subcutaneous TID WC   insulin  glargine-yfgn  20 Units Subcutaneous Daily    isosorbide  mononitrate  90 mg Oral Daily   potassium chloride  SA  20 mEq Oral Daily   potassium chloride   40 mEq Oral Q4H   rivaroxaban   20 mg Oral Q supper   sodium chloride  flush  3 mL Intravenous Q12H    Infusions:  amiodarone  30 mg/hr (08/13/24 0523)   DOBUTamine 2.5 mcg/kg/min (08/13/24 0523)   magnesium  sulfate bolus IVPB      PRN Medications: acetaminophen  **OR** acetaminophen , guaiFENesin , mouth rinse, oxyCODONE , prochlorperazine, senna, sodium chloride  flush    Assessment/Plan   1. Acute on Chronic systolic CHF: Long history of nonischemic cardiomyopathy. Cath in 6/23 with no significant CAD, CI 1.99.  Substance abuse (cocaine and ETOH) thought to play a role in cardiomyopathy, also uncontrolled HTN in the past.  However, cardiac MRI was done in 6/23 showing LVEF 29%, LV severely dilated, asymmetric LVH with basal septum measuring 17 mm, RVEF 45%, patchy LGE in septum and RV insertion sites + subepicardial LGE in inferolateral wall. Scar pattern and asymmetric hypertrophy could be consistent with hypertrophic cardiomyopathy. However, due to subepicardial LGE in inferolateral wall and presence of mediastinal lymphadenopathy, cardiac PET recommended to rule out cardiac sarcoidosis.  This was never done due to lack of insurance.  Echo in 7/25 showed EF 20-25% with moderate concentric LVH, severe RV dysfunction, probably severe functional MR with PISA ERO 0.47 cm^2, IVC dilated.  Low output HF noted at 7/25 admission, he was started on milrinone  and diuresed, milrinone  eventually discontinued.  Now back w/ a/c CHF w/ low output, NYHA Class IIIb symptoms, in setting of recurrent AFL w/ RVR. Initial Co-ox 47%. Now on DBA 2.5.  Co-ox 66% today. Diuresing but remains volume overloaded. BPs elevated - Continue DBA 2.5 mcg/kg/min until fully diuresed.  - Continue IV Lasix  80 mg bid + 5 mg of metolazone . - GDMT limited by CKD stage 4. - Continue Imdur  90 mg daily   - Start Hydralazine  25 mg  tid  - No ? blocker w/ low output \ - Not SGLT2i candidate with A1c 13% - Would like to get a cardiac PET to rule out cardiac sarcoidosis, but this is probably not an option with no insurance. - Not a good candidate for advanced therapies with noncompliance, lack of insurance, RV dysfunction and CKD stage 4.   2. Atrial fibrillation/atypical flutter: Persistent atypical atrial flutter in 7/25, TEE-guided DCCV back to NSR but back in AFL w/ RVR this admission.   - continue IV amio while on DBA - Continue Xarelto   - Plan TEE/DCCV after diuresis  - Eventual fibrillation/flutter ablation would be ideal.    3. CKD stage IV: Suspect cardiorenal syndrome + DM2 + HTN.  Last creatinine prior to this admit was 2.4. SCr 2.0 at  time of this admit, stable w/ diuresis 2.0 today. Baseline Scr 2.5-3.0 - follow BMP daily   - support CO w/ DBA    4. Mitral regurgitation: Severe functional MR.  TEE 7/25 showed severe eccentric, posteriorly-directed MR with restricted posterior leaflet, MR ERO 0.6 cm^2 by PISA.  - continue HF/AFL optimization w/ diuresis and repeat DCCV    5. ETOH/cocaine abuse: Says he has quit. UDS negative on admission    6. DM2: Poor control.   -Insulin  per IM  - hgba1c 13%   7. Rhinovirus Infection:  - supportive care   8. Presumed CAP - completed 3 day course of abx per IM   9. Hypokalemia - K 3.0 - give K supp   Length of Stay: 3  Brittainy Simmons, PA-C  08/13/2024, 9:22 AM  Advanced Heart Failure Team Pager [phone] (M-F; 7a - 5p)  Please contact [facility] for night-coverage after hours (5p -7a ) and weekends on [email or web address]  Patient seen and examined with the above-signed Advanced Practice Provider and/or Housestaff. I personally reviewed laboratory data, imaging studies and relevant notes. I independently examined the patient and formulated the important aspects of the plan. I have edited the note to reflect any of my changes or salient points. I have personally  discussed the plan with the patient and/or family.  Remains on DBA. Co-ox improved. Remains volume overloaded.   Still with AFL with RVR despite IV amio   General: Lying in bed. No resp difficulty HEENT: normal Neck: supple. JVP hard to see Cor: irreg tachy  Lungs: clear Abdomen: obese soft, nontender, + distended.Good bowel sounds. Extremities: no cyanosis, clubbing, rash, edema Neuro: alert & orientedx3, cranial nerves grossly intact. moves all 4 extremities w/o difficulty. Affect pleasant  Continue DBA and IV diuresis. GDMT titration limited by CKD and poorly controlled DM2. Agree with plan as above  Plan TEE/DC-CV tomorrow at 730a  Toribio Fuel, MD  10:18 AM

## 2024-08-14 ENCOUNTER — Other Ambulatory Visit (HOSPITAL_COMMUNITY): Payer: Self-pay

## 2024-08-14 ENCOUNTER — Inpatient Hospital Stay (HOSPITAL_COMMUNITY): Payer: MEDICAID | Admitting: Anesthesiology

## 2024-08-14 ENCOUNTER — Telehealth (HOSPITAL_COMMUNITY): Payer: Self-pay

## 2024-08-14 ENCOUNTER — Inpatient Hospital Stay (HOSPITAL_COMMUNITY): Payer: MEDICAID

## 2024-08-14 ENCOUNTER — Encounter (HOSPITAL_COMMUNITY): Admission: EM | Disposition: A | Payer: Self-pay | Source: Home / Self Care | Attending: Family Medicine

## 2024-08-14 HISTORY — PX: TRANSESOPHAGEAL ECHOCARDIOGRAM (CATH LAB): EP1270

## 2024-08-14 HISTORY — PX: CARDIOVERSION: EP1203

## 2024-08-14 LAB — CBC
HCT: 47 % (ref 39.0–52.0)
Hemoglobin: 15.8 g/dL (ref 13.0–17.0)
MCH: 30.2 pg (ref 26.0–34.0)
MCHC: 33.6 g/dL (ref 30.0–36.0)
MCV: 89.9 fL (ref 80.0–100.0)
Platelets: 201 K/uL (ref 150–400)
RBC: 5.23 MIL/uL (ref 4.22–5.81)
RDW: 13.4 % (ref 11.5–15.5)
WBC: 5.2 K/uL (ref 4.0–10.5)
nRBC: 0 % (ref 0.0–0.2)

## 2024-08-14 LAB — BASIC METABOLIC PANEL WITH GFR
Anion gap: 6 (ref 5–15)
BUN: 25 mg/dL — ABNORMAL HIGH (ref 6–20)
CO2: 34 mmol/L — ABNORMAL HIGH (ref 22–32)
Calcium: 8.4 mg/dL — ABNORMAL LOW (ref 8.9–10.3)
Chloride: 97 mmol/L — ABNORMAL LOW (ref 98–111)
Creatinine, Ser: 1.92 mg/dL — ABNORMAL HIGH (ref 0.61–1.24)
GFR, Estimated: 44 mL/min — ABNORMAL LOW (ref >60)
Glucose, Bld: 139 mg/dL — ABNORMAL HIGH (ref 70–99)
Potassium: 2.8 mmol/L — ABNORMAL LOW (ref 3.5–5.1)
Sodium: 137 mmol/L (ref 135–145)

## 2024-08-14 LAB — GLUCOSE, CAPILLARY
Glucose-Capillary: 151 mg/dL — ABNORMAL HIGH (ref 70–99)
Glucose-Capillary: 228 mg/dL — ABNORMAL HIGH (ref 70–99)
Glucose-Capillary: 273 mg/dL — ABNORMAL HIGH (ref 70–99)
Glucose-Capillary: 224 mg/dL — ABNORMAL HIGH (ref 70–99)
Glucose-Capillary: 230 mg/dL — ABNORMAL HIGH (ref 70–99)

## 2024-08-14 LAB — COOXEMETRY PANEL
Carboxyhemoglobin: 1.2 % (ref 0.5–1.5)
Methemoglobin: <0.7 % (ref 0.0–1.5)
O2 Saturation: 60.8 %
Total hemoglobin: 16.5 g/dL — ABNORMAL HIGH (ref 12.0–16.0)

## 2024-08-14 LAB — ECHO TEE

## 2024-08-14 LAB — POTASSIUM: Potassium: 3.4 mmol/L — ABNORMAL LOW (ref 3.5–5.1)

## 2024-08-14 MED ORDER — FENTANYL CITRATE (PF) 100 MCG/2ML IJ SOLN
INTRAMUSCULAR | Status: AC
  Filled 2024-08-14: qty 2

## 2024-08-14 MED ORDER — PROPOFOL 500 MG/50ML IV EMUL
INTRAVENOUS | Status: DC | PRN
  Administered 2024-08-14: 125 ug/kg/min via INTRAVENOUS

## 2024-08-14 MED ORDER — MIDAZOLAM HCL 2 MG/2ML IJ SOLN
INTRAMUSCULAR | Status: AC
  Filled 2024-08-14: qty 2

## 2024-08-14 MED ORDER — FENTANYL CITRATE (PF) 100 MCG/2ML IJ SOLN
INTRAMUSCULAR | Status: DC | PRN
  Administered 2024-08-14: 50 ug via INTRAVENOUS

## 2024-08-14 MED ORDER — POTASSIUM CHLORIDE 10 MEQ/100ML IV SOLN FOR RAPID INFUSION
10.0000 meq | INTRAVENOUS | Status: AC
  Administered 2024-08-14 (×2): 10 meq via INTRAVENOUS
  Filled 2024-08-14 (×2): qty 100

## 2024-08-14 MED ORDER — BUTAMBEN-TETRACAINE-BENZOCAINE 2-2-14 % EX AERO
INHALATION_SPRAY | CUTANEOUS | Status: AC
  Filled 2024-08-14: qty 20

## 2024-08-14 MED ORDER — PROPOFOL 10 MG/ML IV BOLUS
INTRAVENOUS | Status: DC | PRN
  Administered 2024-08-14: 30 mg via INTRAVENOUS

## 2024-08-14 MED ORDER — POTASSIUM CHLORIDE CRYS ER 20 MEQ PO TBCR
40.0000 meq | EXTENDED_RELEASE_TABLET | ORAL | Status: AC
  Administered 2024-08-14 (×2): 40 meq via ORAL
  Filled 2024-08-14 (×2): qty 2

## 2024-08-14 MED ORDER — MIDAZOLAM HCL (PF) 2 MG/2ML IJ SOLN
INTRAMUSCULAR | Status: DC | PRN
  Administered 2024-08-14: 2 mg via INTRAVENOUS

## 2024-08-14 MED ORDER — POTASSIUM CHLORIDE 10 MEQ/50ML IV SOLN: 10.0000 meq | INTRAVENOUS | Status: DC

## 2024-08-14 MED ORDER — BUTAMBEN-TETRACAINE-BENZOCAINE 2-2-14 % EX AERO
INHALATION_SPRAY | CUTANEOUS | Status: DC | PRN
  Administered 2024-08-14: 1 via TOPICAL

## 2024-08-14 NOTE — Progress Notes (Signed)
 Advanced Heart Failure Rounding Note  Cardiologist: Shelda Bruckner, MD  AHF: Dr. Rolan  Chief Complaint: a/c systolic heart failure w/ low output    Patient Profile  43 y.o. Spanish-speaking male with history of chronic systolic CHF d/t NICM (prior cardiac MRI concerning for possible sarcoid but pt never completed f/u PET), uncontrolled HTN, DM II, mitral regurgitation, ETOH abuse, hx cocaine use, Afib/AFL and poor compliance, admitted w/ a/c CHF w/ marked volume overload and low output and was back in AFL w/ RVR on admission. Initial co-ox 47%. Started on DBA + amio gtts. Respiratory panel + for rhinovirus.   Subjective:    Remains on DBA 2.5. Co-ox 61%  Diuresed another 3L yesterday.   Feels good.   Scr 2.22>>2.04  > 1.9 K 2.8  Remains in AFL with RVR despite IV amio   Objective:   Weight Range: 124.8 kg Body mass index is 44.41 kg/m.   Vital Signs:   Temp:  [97.9 F (36.6 C)-98.7 F (37.1 C)] 97.9 F (36.6 C) (12/04 0710) Pulse Rate:  [56-122] 122 (12/04 0710) Resp:  [18-20] 20 (12/04 0710) BP: (131-163)/(90-113) 152/108 (12/04 0710) SpO2:  [95 %-99 %] 96 % (12/04 0710) Weight:  [124.8 kg] 124.8 kg (12/04 0359) Last BM Date : 08/11/24  Weight change: Filed Weights   08/12/24 1019 08/13/24 0327 08/14/24 0359  Weight: 128.1 kg 125.7 kg 124.8 kg    Intake/Output:   Intake/Output Summary (Last 24 hours) at 08/14/2024 0812 Last data filed at 08/13/2024 2052 Gross per 24 hour  Intake 720 ml  Output 2950 ml  Net -2230 ml      Physical Exam    General:  Lying in bed. No resp difficulty HEENT: normal Neck: supple. no JVD.  Cor: Irreg tachy Lungs: clear Abdomen: obese soft, nontender, nondistended.Good bowel sounds. Extremities: no cyanosis, clubbing, rash, edema Neuro: alert & orientedx3, cranial nerves grossly intact. moves all 4 extremities w/o difficulty. Affect pleasant   Telemetry   AFL 120s Personally reviewed   Labs     CBC Recent Labs    08/13/24 0508 08/14/24 0507  WBC 4.9 5.2  HGB 15.5 15.8  HCT 46.0 47.0  MCV 90.0 89.9  PLT 185 201   Basic Metabolic Panel Recent Labs    87/97/74 0448 08/13/24 0508 08/14/24 0507  NA 132* 136 137  K 3.6 3.0* 2.8*  CL 96* 95* 97*  CO2 32 28 34*  GLUCOSE 192* 170* 139*  BUN 28* 30* 25*  CREATININE 2.22* 2.04* 1.92*  CALCIUM  8.5* 8.9 8.4*  MG 2.0 1.9  --    Liver Function Tests Recent Labs    08/11/24 2022  AST 25  ALT 22  ALKPHOS 88  BILITOT 0.6  PROT 7.0  ALBUMIN 2.9*   No results for input(s): LIPASE, AMYLASE in the last 72 hours. Cardiac Enzymes No results for input(s): CKTOTAL, CKMB, CKMBINDEX, TROPONINI in the last 72 hours.  BNP: BNP (last 3 results) Recent Labs    03/09/24 1845 03/24/24 1637 08/10/24 2148  BNP 2,578.5* 1,035.3* 193.5*    ProBNP (last 3 results) No results for input(s): PROBNP in the last 8760 hours.   D-Dimer No results for input(s): DDIMER in the last 72 hours. Hemoglobin A1C No results for input(s): HGBA1C in the last 72 hours.  Fasting Lipid Panel No results for input(s): CHOL, HDL, LDLCALC, TRIG, CHOLHDL, LDLDIRECT in the last 72 hours. Thyroid  Function Tests No results for input(s): TSH, T4TOTAL, T3FREE, THYROIDAB in the  last 72 hours.  Invalid input(s): FREET3  Other results:   Imaging    No results found.   Medications:     Scheduled Medications:  [MAR Hold] atorvastatin   20 mg Oral Daily   butamben-tetracaine-benzocaine       [MAR Hold] Chlorhexidine  Gluconate Cloth  6 each Topical Daily   fentaNYL        [MAR Hold] hydrALAZINE   25 mg Oral Q8H   [MAR Hold] insulin  aspart  0-15 Units Subcutaneous TID WC   [MAR Hold] insulin  aspart  0-5 Units Subcutaneous QHS   [MAR Hold] insulin  aspart  5 Units Subcutaneous TID WC   [MAR Hold] insulin  glargine-yfgn  20 Units Subcutaneous Daily   [MAR Hold] isosorbide  mononitrate  90 mg Oral Daily    midazolam        [MAR Hold] potassium chloride  SA  20 mEq Oral Daily   [MAR Hold] rivaroxaban   20 mg Oral Q supper   [MAR Hold] sodium chloride  flush  3 mL Intravenous Q12H    Infusions:  amiodarone  30 mg/hr (08/14/24 0243)   DOBUTamine 2.5 mcg/kg/min (08/13/24 2135)   potassium chloride  10 mEq (08/14/24 0804)    PRN Medications: [MAR Hold] acetaminophen  **OR** [MAR Hold] acetaminophen , butamben-tetracaine-benzocaine, fentaNYL , [MAR Hold] guaiFENesin , midazolam , [MAR Hold] mouth rinse, [MAR Hold] oxyCODONE , [MAR Hold] prochlorperazine, [MAR Hold] senna, [MAR Hold] sodium chloride  flush    Assessment/Plan   1. Acute on Chronic systolic CHF: Long history of nonischemic cardiomyopathy. Cath in 6/23 with no significant CAD, CI 1.99.  Substance abuse (cocaine and ETOH) thought to play a role in cardiomyopathy, also uncontrolled HTN in the past.  However, cardiac MRI was done in 6/23 showing LVEF 29%, LV severely dilated, asymmetric LVH with basal septum measuring 17 mm, RVEF 45%, patchy LGE in septum and RV insertion sites + subepicardial LGE in inferolateral wall. Scar pattern and asymmetric hypertrophy could be consistent with hypertrophic cardiomyopathy. However, due to subepicardial LGE in inferolateral wall and presence of mediastinal lymphadenopathy, cardiac PET recommended to rule out cardiac sarcoidosis.  This was never done due to lack of insurance.  Echo in 7/25 showed EF 20-25% with moderate concentric LVH, severe RV dysfunction, probably severe functional MR with PISA ERO 0.47 cm^2, IVC dilated.  Low output HF noted at 7/25 admission, he was started on milrinone  and diuresed, milrinone  eventually discontinued.  Now back w/ a/c CHF w/ low output, NYHA Class IIIb symptoms, in setting of recurrent AFL w/ RVR. Initial Co-ox 47%.  - Remains on DBA 2.5. Co-ox improved - Volume looks good.Start po diuretics after DC-CV - Will cut DBA to 1 after DC-CV - GDMT limited by CKD stage 4. - Continue  Imdur  90 mg daily   - Continue  Hydralazine  25 mg tid  - No ? blocker w/ low output  - Not SGLT2i candidate with A1c 13% - Would like to get a cardiac PET to rule out cardiac sarcoidosis, but this is probably not an option with no insurance. - Not a good candidate for advanced therapies with noncompliance, lack of insurance, RV dysfunction and CKD stage 4.   2. Atrial fibrillation/atypical flutter: Persistent atypical atrial flutter in 7/25, TEE-guided DCCV back to NSR but back in AFL w/ RVR this admission.   - continue IV amio while on DBA - Continue Xarelto   - TEE/DC-CV today - Eventual AF ablation   3. CKD stage IV: Suspect cardiorenal syndrome + DM2 + HTN.  Last creatinine prior to this admit was 2.4. SCr 2.0 at  time of this admit, stable w/ diuresis 1.9 today. Baseline Scr 2.5-3.0 - follow BMP daily   - support CO w/ DBA    4. Mitral regurgitation: Severe functional MR.  TEE 7/25 showed severe eccentric, posteriorly-directed MR with restricted posterior leaflet, MR ERO 0.6 cm^2 by PISA.  - continue HF/AFL optimization w/ diuresis and repeat DCCV    5. ETOH/cocaine abuse: Says he has quit. UDS negative on admission    6. DM2: Poor control.   -Insulin  per IM  - hgba1c 13%   7. Rhinovirus Infection:  - supportive care   8. Presumed CAP - completed 3 day course of abx per IM   9. Hypokalemia - K 2.8 - will supp today  Length of Stay: 4  Toribio Fuel, MD  08/14/2024, 8:12 AM  Advanced Heart Failure Team Pager [phone] (M-F; 7a - 5p)  Please contact [facility] for night-coverage after hours (5p -7a ) and weekends on [email or web address]

## 2024-08-14 NOTE — Anesthesia Preprocedure Evaluation (Signed)
 Anesthesia Evaluation  Patient identified by MRN, date of birth, ID band Patient awake    Reviewed: Allergy & Precautions, H&P , NPO status , Patient's Chart, lab work & pertinent test results  History of Anesthesia Complications Negative for: history of anesthetic complications  Airway Mallampati: II  TM Distance: >3 FB Neck ROM: Full    Dental no notable dental hx.    Pulmonary neg pulmonary ROS, neg sleep apnea, neg COPD   Pulmonary exam normal breath sounds clear to auscultation       Cardiovascular hypertension, +CHF  Normal cardiovascular exam+ dysrhythmias Atrial Fibrillation  Rhythm:Regular Rate:Normal  IMPRESSIONS     1. No left ventricular thrombus is seen, but Definity  contrast was not  used. Left ventricular ejection fraction, by estimation, is 20 to 25%. The  left ventricle has severely decreased function. The left ventricle  demonstrates global hypokinesis. The  left ventricular internal cavity size was mildly dilated. There is  moderate concentric left ventricular hypertrophy. Indeterminate diastolic  filling due to E-A fusion.   2. Right ventricular systolic function is severely reduced. The right  ventricular size is severely enlarged. There is moderately elevated  pulmonary artery systolic pressure. The estimated right ventricular  systolic pressure is 59.1 mmHg.   3. Left atrial size was severely dilated.   4. Right atrial size was severely dilated.   5. The mitral valve is abnormal. Moderate to severe mitral valve  regurgitation.   6. Tricuspid valve regurgitation is mild to moderate.   7. The aortic valve is tricuspid. Aortic valve regurgitation is not  visualized. No aortic stenosis is present.   8. The inferior vena cava is dilated in size with <50% respiratory  variability, suggesting right atrial pressure of 15 mmHg.     Neuro/Psych neg Seizures negative neurological ROS  negative psych ROS    GI/Hepatic negative GI ROS,,,(+)     substance abuse  alcohol use  Endo/Other  diabetes    Renal/GU Renal InsufficiencyRenal disease  negative genitourinary   Musculoskeletal negative musculoskeletal ROS (+)    Abdominal   Peds negative pediatric ROS (+)  Hematology negative hematology ROS (+)   Anesthesia Other Findings   Reproductive/Obstetrics negative OB ROS                              Anesthesia Physical Anesthesia Plan  ASA: 4  Anesthesia Plan: General   Post-op Pain Management:    Induction: Intravenous  PONV Risk Score and Plan: 2 and Treatment may vary due to age or medical condition  Airway Management Planned: Natural Airway and Simple Face Mask  Additional Equipment: None  Intra-op Plan:   Post-operative Plan:   Informed Consent: I have reviewed the patients History and Physical, chart, labs and discussed the procedure including the risks, benefits and alternatives for the proposed anesthesia with the patient or authorized representative who has indicated his/her understanding and acceptance.     Dental advisory given  Plan Discussed with: CRNA  Anesthesia Plan Comments:          Anesthesia Quick Evaluation

## 2024-08-14 NOTE — H&P (View-Only) (Signed)
 Advanced Heart Failure Rounding Note  Cardiologist: Shelda Bruckner, MD  AHF: Dr. Rolan  Chief Complaint: a/c systolic heart failure w/ low output    Patient Profile  43 y.o. Spanish-speaking male with history of chronic systolic CHF d/t NICM (prior cardiac MRI concerning for possible sarcoid but pt never completed f/u PET), uncontrolled HTN, DM II, mitral regurgitation, ETOH abuse, hx cocaine use, Afib/AFL and poor compliance, admitted w/ a/c CHF w/ marked volume overload and low output and was back in AFL w/ RVR on admission. Initial co-ox 47%. Started on DBA + amio gtts. Respiratory panel + for rhinovirus.   Subjective:    Remains on DBA 2.5. Co-ox 61%  Diuresed another 3L yesterday.   Feels good.   Scr 2.22>>2.04  > 1.9 K 2.8  Remains in AFL with RVR despite IV amio   Objective:   Weight Range: 124.8 kg Body mass index is 44.41 kg/m.   Vital Signs:   Temp:  [97.9 F (36.6 C)-98.7 F (37.1 C)] 97.9 F (36.6 C) (12/04 0710) Pulse Rate:  [56-122] 122 (12/04 0710) Resp:  [18-20] 20 (12/04 0710) BP: (131-163)/(90-113) 152/108 (12/04 0710) SpO2:  [95 %-99 %] 96 % (12/04 0710) Weight:  [124.8 kg] 124.8 kg (12/04 0359) Last BM Date : 08/11/24  Weight change: Filed Weights   08/12/24 1019 08/13/24 0327 08/14/24 0359  Weight: 128.1 kg 125.7 kg 124.8 kg    Intake/Output:   Intake/Output Summary (Last 24 hours) at 08/14/2024 0812 Last data filed at 08/13/2024 2052 Gross per 24 hour  Intake 720 ml  Output 2950 ml  Net -2230 ml      Physical Exam    General:  Lying in bed. No resp difficulty HEENT: normal Neck: supple. no JVD.  Cor: Irreg tachy Lungs: clear Abdomen: obese soft, nontender, nondistended.Good bowel sounds. Extremities: no cyanosis, clubbing, rash, edema Neuro: alert & orientedx3, cranial nerves grossly intact. moves all 4 extremities w/o difficulty. Affect pleasant   Telemetry   AFL 120s Personally reviewed   Labs     CBC Recent Labs    08/13/24 0508 08/14/24 0507  WBC 4.9 5.2  HGB 15.5 15.8  HCT 46.0 47.0  MCV 90.0 89.9  PLT 185 201   Basic Metabolic Panel Recent Labs    87/97/74 0448 08/13/24 0508 08/14/24 0507  NA 132* 136 137  K 3.6 3.0* 2.8*  CL 96* 95* 97*  CO2 32 28 34*  GLUCOSE 192* 170* 139*  BUN 28* 30* 25*  CREATININE 2.22* 2.04* 1.92*  CALCIUM  8.5* 8.9 8.4*  MG 2.0 1.9  --    Liver Function Tests Recent Labs    08/11/24 2022  AST 25  ALT 22  ALKPHOS 88  BILITOT 0.6  PROT 7.0  ALBUMIN 2.9*   No results for input(s): LIPASE, AMYLASE in the last 72 hours. Cardiac Enzymes No results for input(s): CKTOTAL, CKMB, CKMBINDEX, TROPONINI in the last 72 hours.  BNP: BNP (last 3 results) Recent Labs    03/09/24 1845 03/24/24 1637 08/10/24 2148  BNP 2,578.5* 1,035.3* 193.5*    ProBNP (last 3 results) No results for input(s): PROBNP in the last 8760 hours.   D-Dimer No results for input(s): DDIMER in the last 72 hours. Hemoglobin A1C No results for input(s): HGBA1C in the last 72 hours.  Fasting Lipid Panel No results for input(s): CHOL, HDL, LDLCALC, TRIG, CHOLHDL, LDLDIRECT in the last 72 hours. Thyroid  Function Tests No results for input(s): TSH, T4TOTAL, T3FREE, THYROIDAB in the  last 72 hours.  Invalid input(s): FREET3  Other results:   Imaging    No results found.   Medications:     Scheduled Medications:  [MAR Hold] atorvastatin   20 mg Oral Daily   butamben-tetracaine-benzocaine       [MAR Hold] Chlorhexidine  Gluconate Cloth  6 each Topical Daily   fentaNYL        [MAR Hold] hydrALAZINE   25 mg Oral Q8H   [MAR Hold] insulin  aspart  0-15 Units Subcutaneous TID WC   [MAR Hold] insulin  aspart  0-5 Units Subcutaneous QHS   [MAR Hold] insulin  aspart  5 Units Subcutaneous TID WC   [MAR Hold] insulin  glargine-yfgn  20 Units Subcutaneous Daily   [MAR Hold] isosorbide  mononitrate  90 mg Oral Daily    midazolam        [MAR Hold] potassium chloride  SA  20 mEq Oral Daily   [MAR Hold] rivaroxaban   20 mg Oral Q supper   [MAR Hold] sodium chloride  flush  3 mL Intravenous Q12H    Infusions:  amiodarone  30 mg/hr (08/14/24 0243)   DOBUTamine 2.5 mcg/kg/min (08/13/24 2135)   potassium chloride  10 mEq (08/14/24 0804)    PRN Medications: [MAR Hold] acetaminophen  **OR** [MAR Hold] acetaminophen , butamben-tetracaine-benzocaine, fentaNYL , [MAR Hold] guaiFENesin , midazolam , [MAR Hold] mouth rinse, [MAR Hold] oxyCODONE , [MAR Hold] prochlorperazine, [MAR Hold] senna, [MAR Hold] sodium chloride  flush    Assessment/Plan   1. Acute on Chronic systolic CHF: Long history of nonischemic cardiomyopathy. Cath in 6/23 with no significant CAD, CI 1.99.  Substance abuse (cocaine and ETOH) thought to play a role in cardiomyopathy, also uncontrolled HTN in the past.  However, cardiac MRI was done in 6/23 showing LVEF 29%, LV severely dilated, asymmetric LVH with basal septum measuring 17 mm, RVEF 45%, patchy LGE in septum and RV insertion sites + subepicardial LGE in inferolateral wall. Scar pattern and asymmetric hypertrophy could be consistent with hypertrophic cardiomyopathy. However, due to subepicardial LGE in inferolateral wall and presence of mediastinal lymphadenopathy, cardiac PET recommended to rule out cardiac sarcoidosis.  This was never done due to lack of insurance.  Echo in 7/25 showed EF 20-25% with moderate concentric LVH, severe RV dysfunction, probably severe functional MR with PISA ERO 0.47 cm^2, IVC dilated.  Low output HF noted at 7/25 admission, he was started on milrinone  and diuresed, milrinone  eventually discontinued.  Now back w/ a/c CHF w/ low output, NYHA Class IIIb symptoms, in setting of recurrent AFL w/ RVR. Initial Co-ox 47%.  - Remains on DBA 2.5. Co-ox improved - Volume looks good.Start po diuretics after DC-CV - Will cut DBA to 1 after DC-CV - GDMT limited by CKD stage 4. - Continue  Imdur  90 mg daily   - Continue  Hydralazine  25 mg tid  - No ? blocker w/ low output  - Not SGLT2i candidate with A1c 13% - Would like to get a cardiac PET to rule out cardiac sarcoidosis, but this is probably not an option with no insurance. - Not a good candidate for advanced therapies with noncompliance, lack of insurance, RV dysfunction and CKD stage 4.   2. Atrial fibrillation/atypical flutter: Persistent atypical atrial flutter in 7/25, TEE-guided DCCV back to NSR but back in AFL w/ RVR this admission.   - continue IV amio while on DBA - Continue Xarelto   - TEE/DC-CV today - Eventual AF ablation   3. CKD stage IV: Suspect cardiorenal syndrome + DM2 + HTN.  Last creatinine prior to this admit was 2.4. SCr 2.0 at  time of this admit, stable w/ diuresis 1.9 today. Baseline Scr 2.5-3.0 - follow BMP daily   - support CO w/ DBA    4. Mitral regurgitation: Severe functional MR.  TEE 7/25 showed severe eccentric, posteriorly-directed MR with restricted posterior leaflet, MR ERO 0.6 cm^2 by PISA.  - continue HF/AFL optimization w/ diuresis and repeat DCCV    5. ETOH/cocaine abuse: Says he has quit. UDS negative on admission    6. DM2: Poor control.   -Insulin  per IM  - hgba1c 13%   7. Rhinovirus Infection:  - supportive care   8. Presumed CAP - completed 3 day course of abx per IM   9. Hypokalemia - K 2.8 - will supp today  Length of Stay: 4  Toribio Fuel, MD  08/14/2024, 8:12 AM  Advanced Heart Failure Team Pager [phone] (M-F; 7a - 5p)  Please contact [facility] for night-coverage after hours (5p -7a ) and weekends on [email or web address]

## 2024-08-14 NOTE — Plan of Care (Signed)
  Problem: Nutritional: Goal: Maintenance of adequate nutrition will improve Outcome: Progressing   Problem: Education: Goal: Knowledge of General Education information will improve Description: Including pain rating scale, medication(s)/side effects and non-pharmacologic comfort measures Outcome: Progressing   Problem: Clinical Measurements: Goal: Respiratory complications will improve Outcome: Progressing   Problem: Activity: Goal: Risk for activity intolerance will decrease Outcome: Progressing

## 2024-08-14 NOTE — Telephone Encounter (Signed)
 Advanced Heart Failure Patient Advocate Encounter  Application for Xarelto  faxed to [facility] on 08/14/2024. Application form attached to patient chart.  Rachel DEL, CPhT Rx Patient Advocate Phone: [phone]

## 2024-08-14 NOTE — CV Procedure (Signed)
   TRANSESOPHAGEAL ECHOCARDIOGRAM GUIDED DIRECT CURRENT CARDIOVERSION  NAME:  Justin [name]   MRN: [id number] DOB:  Mar 04, 1981   ADMIT DATE: 08/10/2024  INDICATIONS:  AF  PROCEDURE:   Informed consent was obtained prior to the procedure. The risks, benefits and alternatives for the procedure were discussed and the patient comprehended these risks.  Risks include, but are not limited to, cough, sore throat, vomiting, nausea, somnolence, esophageal and stomach trauma or perforation, bleeding, low blood pressure, aspiration, pneumonia, infection, trauma to the teeth and death.    After a procedural time-out, the oropharynx was anesthetized and the patient was sedated by the anesthesia service. The transesophageal probe was inserted in the esophagus and stomach without difficulty and multiple views were obtained.   FINDINGS:  LEFT VENTRICLE: EF = EF 20-25% global HK  RIGHT VENTRICLE: Moderate HK  LEFT ATRIUM: Severely dilated  LEFT ATRIAL APPENDAGE: No clot  RIGHT ATRIUM: Severely dilated  AORTIC VALVE:  Trileaflet.  No AS/AI  MITRAL VALVE:    Moderate posterior MR  TRICUSPID VALVE: Mild TR  PULMONIC VALVE: Normal  INTERATRIAL SEPTUM: No PFO  PERICARDIUM: No effusion   DESCENDING AORTA:Normal   CARDIOVERSION:     Indications:  Atrial Flutter  Procedure Details:  Once the TEE was complete, the patient had the defibrillator pads placed in the anterior and posterior position. Once an appropriate level of sedation was achieved, the patient received a single biphasic, synchronized 200J shock with prompt conversion to sinus rhythm. No apparent complications.   Toribio Fuel, MD  9:43 AM

## 2024-08-14 NOTE — Progress Notes (Addendum)
 Cardiology Admission Progress note:    Patient ID: Justin [name] MRN: [id number]; DOB: 1981/05/25   Admission date: 08/10/2024  Primary Care Provider: Celestia Rosaline SQUIBB, NP Heart failure Cardiologist: [name], MD Cardiologist:  Shelda Bruckner, MD     Electrophysiologist:  Soyla [name] Norton, MD     Chief Complaint:  Shortness of breath, leg swelling, cough  Patient Profile:   Justin [name] is a 43 y.o. male with PMH of HFrEF (20-25% on 03/2024), NICMO, afibrillation and flutter on Xarelto , mitral regurgitation HTN, alcohol and cocaine abuse in remission, TIIDM, CKD 3b who is being seen for the evaluation of heart failure management. He is admitted for heart failure exacerbation.   Interval history:   [name] was seen in pre-op prior to his TEE. He was feeling better with no shortness of breath. No chest pain. No peripheral swelling. No questions prior to the procedure. Still in Aflutter with RVR.   Physical Exam/Data:   Vitals:   08/14/24 0359 08/14/24 0400 08/14/24 0640 08/14/24 0710  BP:  (!) 142/90 (!) 144/113 (!) 152/108  Pulse:  (!) 56  (!) 122  Resp:  20  20  Temp:  98.4 F (36.9 C)  97.9 F (36.6 C)  TempSrc:  Oral  Temporal  SpO2:  97%  96%  Weight: 124.8 kg     Height:        Intake/Output Summary (Last 24 hours) at 08/14/2024 9076 Last data filed at 08/13/2024 2052 Gross per 24 hour  Intake 720 ml  Output 2950 ml  Net -2230 ml      08/14/2024    3:59 AM 08/13/2024    3:27 AM 08/12/2024   10:19 AM  Last 3 Weights  Weight (lbs) 275 lb 2.2 oz 277 lb 3.2 oz 282 lb 4.8 oz  Weight (kg) 124.8 kg 125.737 kg 128.05 kg     Body mass index is 44.41 kg/m.   Physical Exam Constitutional:      General: He is not in acute distress.    Appearance: He is not ill-appearing.  Cardiovascular:     Rate and Rhythm: Regular rhythm. Tachycardia present.     Heart sounds: No murmur heard.    No friction  rub. No gallop.  Pulmonary:     Effort: Pulmonary effort is normal.     Breath sounds: Normal breath sounds.  Abdominal:     Tenderness: There is no abdominal tenderness.  Musculoskeletal:     Right lower leg: No edema.     Left lower leg: No edema.  Neurological:     Mental Status: He is alert.      Relevant CV Studies: TEE 7/7  1Left ventricular ejection fraction, by estimation, is 20 to 25%. The  left ventricle has severely decreased function. The left ventricle  demonstrates global hypokinesis. The left ventricular internal cavity size  was mildly dilated. There is mild  concentric left ventricular hypertrophy.   2. Peak RV-RA gradient 30 mmHg. Right ventricular systolic function is  moderately reduced. The right ventricular size is normal.   3. Left atrial size was moderately dilated. No left atrial/left atrial  appendage thrombus was detected.   4. Right atrial size was mildly dilated.   5. No PFO or ASD by color doppler or bubbles.   6. The aortic valve is tricuspid. Aortic valve regurgitation is not  visualized. No aortic stenosis is present.   7. There was eccentric, posteriorly-directed mitral regurgitation with  restricted posterior mitral leaflet. MR appeared severe with PISA ERO 0.6  cm^2. There was systolic flow reversal noted in the pulmonary vein doppler  pattern. No evidence of mitral  stenosis. The mean mitral valve gradient is 3.0 mmHg.   8. 3D performed of the mitral valve.    Right/Left heart cath 6/23 Patent coronary arteries with minimal luminal irregularity Low cardiac filling pressures (PCWP mean of 3 mmHg) with borderline cardiac output (CO 4.2 L/m and CI 2.0)  Laboratory Data:  Chemistry Recent Labs  Lab 08/13/24 0508 08/14/24 0507  NA 136 137  K 3.0* 2.8*  CL 95* 97*  CO2 28 34*  GLUCOSE 170* 139*  BUN 30* 25*  CREATININE 2.04* 1.92*  CALCIUM  8.9 8.4*  GFRNONAA 40* 44*  ANIONGAP 13 6    Recent Labs  Lab 08/11/24 2022  PROT 7.0   ALBUMIN 2.9*  AST 25  ALT 22  ALKPHOS 88  BILITOT 0.6   Hematology Recent Labs  Lab 08/13/24 0508 08/14/24 0507  WBC 4.9 5.2  RBC 5.11 5.23  HGB 15.5 15.8  HCT 46.0 47.0  MCV 90.0 89.9  MCH 30.3 30.2  MCHC 33.7 33.6  RDW 13.4 13.4  PLT 185 201   Cardiac EnzymesNo results for input(s): TROPONINI in the last 168 hours. No results for input(s): TROPIPOC in the last 168 hours.  BNP Recent Labs  Lab 08/10/24 2148  BNP 193.5*    DDimer No results for input(s): DDIMER in the last 168 hours.  Radiology/Studies:  No results found.   Assessment and Plan:   Low output acute on chronic heart failure exacerbation HFrEF (20-25%) secondary to [facility] History of recurrent heart failure exacerbations. Found to be in low-output state via low co-oximetry on 12/1. Down 3kgs this admission. Urine output yesterday with 3.6L net. On dobutamine, metolazone , and Lasix . Creatinine improved this morning from 2.04 yesterday to 1.92 today. Coox 60.8. K low at 2.8 today. Euvolemic on exam today.   -Heart failure team following -TEE with Cardioversion today -Cooximetry and CVP daily -Hold Metoprolol  given low output state -Decrease Dobutamine to 1mcg/kg/min after cardioversion -PO diuretics per heart failure team after cardioversion. -Continue Imdur  90mg  daily -Keep K >4, replete today -Keep Mag>2 -Daily BMP and magnesium , Follow creatinine. -Daily weights -Strict Is and Os -Cardiac PET to evaluate for sarcoidosis in the outpatient setting, may be cost-prohibitive -F/U with advanced heart failure outpatient.   Atrial Flutter with RVR S/P cardioversion at recent hospitalization. NSR at outpatient follow up. 2:1 A-flutter on EKG and telemetry now. Rates in the 120-130s. Transitioned to IV amiodarone  yesterday.   -IV amiodarone   -TEE with cardioversion today -Hold Metoprolol  -Xarelto  20mg  daily (Creatinine clearance currently greater than 50) reduce dose to 15mg  if CKD worsens    Hypertension Mildly elevated on admission to 159/95. Home medication Hydralazine  100mg  TID, Imdur  90 daily. Likely taking these medications each once daily. Hydralazine  held in the setting of normal BP with Imdur  and metoprolol . BP 144/113 this morning, prior to Imdur  administration.   -Imdur  90mg  daily -Hydralazine  25mg  TID -Diuresis as above   CKD IV Unclear baseline creatinine. Improved flat with diuresis overall and improved since yesterday. Creatinine improved to 1.9 today today.   -DBA and diuretics as above. -Creatinine daily    Per primary team T2DM Obesity   For questions or updates, please contact [facility] Please consult [email or web address] for contact info under       Signed, Melvenia Morrison, MD  08/14/2024 9:23 AM  ATTENDING ATTESTATION:  After conducting a review of all available clinical information with the care team, interviewing the patient, and performing a physical exam, I agree with the findings and plan described in this note.   GEN: No acute distress, AO x 3 HEENT:  MMM, unable to assess JVD due to neck girth, no scleral icterus Cardiac: RRR, no murmurs, rubs, or gallops.  Respiratory: Clear to auscultation bilaterally. GI: Soft, nontender, non-distended  MS: No edema; No deformity.  Warm and well perfused. Neuro:  Nonfocal  Vasc:  +2 radial pulses  Patient with brisk diuresis overnight (net negative 2.2L).  Creatinine improving patient underwent TEE cardioversion today with conversion to normal sinus rhythm.  Patient remains on DBA and amiodarone .  DBA wean and introduction of GDMT per AHF.  Will sign off, appreciate HF care.  Lurena Red, MD Pager [phone]

## 2024-08-14 NOTE — Plan of Care (Signed)

## 2024-08-14 NOTE — Interval H&P Note (Signed)
 History and Physical Interval Note:  08/14/2024 9:42 AM  Justin [name]  has presented today for surgery, with the diagnosis of afib.  The various methods of treatment have been discussed with the patient and family. After consideration of risks, benefits and other options for treatment, the patient has consented to  Procedure(s): TRANSESOPHAGEAL ECHOCARDIOGRAM (N/A) CARDIOVERSION (N/A) as a surgical intervention.  The patient's history has been reviewed, patient examined, no change in status, stable for surgery.  I have reviewed the patient's chart and labs.  Questions were answered to the patient's satisfaction.     [name]

## 2024-08-14 NOTE — Progress Notes (Signed)
 Nutrition Brief Note  Consult received for uncontrolled diabetes diet education.  HgbA1c 13.9%  Patient currently admitted with acute on chronic systolic CHF, BiV failure. PMH significant for chronic CHF, HTN, T2DM, EtOH use, cocaine abuse and multiple hospitalizations for CHF exacerbation.   Patient off unit for TEE/DC-CV. Spanish HF and DM education handouts added to AVS. Will attempt to follow up for in person education prior to admission as time allows. Patient would also benefit from outpatient follow up with a Dietitian at the [facility] education center following improvement in acute illness.   Allie Daizee Firmin, RDN, LDN Clinical Nutrition See AMiON for contact information.

## 2024-08-14 NOTE — Progress Notes (Signed)
 PROGRESS NOTE    Justin [name]  FMW:[id number] DOB: 01-23-1981 DOA: 08/10/2024 PCP: Celestia Rosaline SQUIBB, NP  [phone] with chronic systolic CHF, hypertension, type 2 diabetes mellitus, EtOH use, history of cocaine abuse multiple hospitalizations with CHF exacerbation, recently hospitalized 7/25 with CHF after being off cardiac meds, developed low output and cardiorenal syndrome then required milrinone  temporarily in the hospital.  Now readmitted with volume overload, low output, atrial flutter with RVR. - Initial Co. ox was 47 with creatinine of 2, heart failure team consulted he was started on dobutamine and Lasix  - Respiratory virus panel positive for rhinovirus - Improving with diuresis - 12/4, underwent TEE/DCCV   Subjective: - Feeling better overall, just back from cardioversion  Assessment and Plan:  Acute on chronic systolic CHF, BiV failure Severe mitral regurgitation -known NICM, no significant CAD on cath 6/23 - Cardiomyopathy felt to be related to EtOH, cocaine and uncontrolled hypertension - Recent echo 7/25 noted EF 20-25%, moderate LVH, severely reduced RV, severe MR - Recent admission with low output required milrinone  - Initial Co. ox 47, heart failure team consulting, started on low-dose dobutamine and diuretics, plan to wean off dobutamine today - Switch to oral diuretics, continue Imdur , hydralazine  - GDMT limited by CKD 4 - Plan for outpatient cardiac PET scan to rule out sarcoidosis  Persistent atrial flutters/fibrillation - Continue oral amiodarone , Xarelto  - sp TEE/DCCV today, now in NSR  CKD 4 - Baseline creatinine around 2, - Improving, continue dobutamine, diuretics  Hypokalemia Replete  Severe mitral regurgitation - Felt to be functional MR, TEE 7/25 noted severe eccentric posterior directed MR  History of EtOH and cocaine use - Allegedly has quit  URI, rhinovirus - Supportive care  Type 2 diabetes mellitus - Continue glargine  and meal coverage insulin , CBGs improving  Obesity, BMI is 45   DVT prophylaxis: Xarelto  Code Status: Full code Family Communication: None present Disposition Plan: Home likely 1 to 2 days  Consultants:    Procedures:   Antimicrobials:    Objective: Vitals:   08/14/24 1015 08/14/24 1020 08/14/24 1025 08/14/24 1053  BP: (!) 135/100 (!) 143/110 (!) 135/100 (!) 129/102  Pulse: 91 86 93   Resp: (!) 23 (!) 25 18 18   Temp:    98.1 F (36.7 C)  TempSrc:    Oral  SpO2: 94% 96% 95% 97%  Weight:      Height:        Intake/Output Summary (Last 24 hours) at 08/14/2024 1132 Last data filed at 08/13/2024 2052 Gross per 24 hour  Intake 480 ml  Output 1750 ml  Net -1270 ml   Filed Weights   08/12/24 1019 08/13/24 0327 08/14/24 0359  Weight: 128.1 kg 125.7 kg 124.8 kg    Examination:  General exam: Appears calm and comfortable, no distress HEENT: Neck obese unable to assess JVD Respiratory system: Few basilar rhonchi otherwise clear Cardiovascular system: S1 & S2 heard, regular rhythm Abd: nondistended, soft and nontender.Normal bowel sounds heard. Central nervous system: Alert and oriented. No focal neurological deficits. Extremities: Trace edema Skin: No rashes Psychiatry:  Mood & affect appropriate.     Data Reviewed:   CBC: Recent Labs  Lab 08/10/24 2148 08/11/24 0631 08/12/24 0448 08/13/24 0508 08/14/24 0507  WBC 11.5* 11.1* 7.1 4.9 5.2  HGB 16.8 16.4 15.9 15.5 15.8  HCT 50.5 48.8 47.6 46.0 47.0  MCV 90.2 90.0 91.0 90.0 89.9  PLT 228 212 190 185 201   Basic Metabolic Panel: Recent Labs  Lab 08/10/24 2148 08/11/24 0631 08/12/24 0448 08/13/24 0508 08/14/24 0507  NA 132* 131* 132* 136 137  K 3.3* 3.7 3.6 3.0* 2.8*  CL 96* 95* 96* 95* 97*  CO2 23 23 32 28 34*  GLUCOSE 416* 373* 192* 170* 139*  BUN 27* 25* 28* 30* 25*  CREATININE 2.06* 2.09* 2.22* 2.04* 1.92*  CALCIUM  8.8* 8.1* 8.5* 8.9 8.4*  MG  --  1.7 2.0 1.9  --    GFR: Estimated  Creatinine Clearance: 61.3 mL/min (A) (by C-G formula based on SCr of 1.92 mg/dL (H)). Liver Function Tests: Recent Labs  Lab 08/11/24 2022  AST 25  ALT 22  ALKPHOS 88  BILITOT 0.6  PROT 7.0  ALBUMIN 2.9*   No results for input(s): LIPASE, AMYLASE in the last 168 hours. No results for input(s): AMMONIA in the last 168 hours. Coagulation Profile: No results for input(s): INR, PROTIME in the last 168 hours. Cardiac Enzymes: No results for input(s): CKTOTAL, CKMB, CKMBINDEX, TROPONINI in the last 168 hours. BNP (last 3 results) No results for input(s): PROBNP in the last 8760 hours. HbA1C: No results for input(s): HGBA1C in the last 72 hours.  CBG: Recent Labs  Lab 08/13/24 0626 08/13/24 1227 08/13/24 1611 08/13/24 2049 08/14/24 0611  GLUCAP 196* 242* 256* 236* 151*   Lipid Profile: No results for input(s): CHOL, HDL, LDLCALC, TRIG, CHOLHDL, LDLDIRECT in the last 72 hours. Thyroid  Function Tests: No results for input(s): TSH, T4TOTAL, FREET4, T3FREE, THYROIDAB in the last 72 hours. Anemia Panel: No results for input(s): VITAMINB12, FOLATE, FERRITIN, TIBC, IRON, RETICCTPCT in the last 72 hours. Urine analysis:    Component Value Date/Time   COLORURINE YELLOW 03/10/2024 0042   APPEARANCEUR CLEAR 03/10/2024 0042   LABSPEC 1.011 03/10/2024 0042   PHURINE 5.0 03/10/2024 0042   GLUCOSEU NEGATIVE 03/10/2024 0042   HGBUR NEGATIVE 03/10/2024 0042   BILIRUBINUR NEGATIVE 03/10/2024 0042   KETONESUR NEGATIVE 03/10/2024 0042   PROTEINUR 100 (A) 03/10/2024 0042   NITRITE NEGATIVE 03/10/2024 0042   LEUKOCYTESUR NEGATIVE 03/10/2024 0042   Sepsis Labs: @LABRCNTIP (procalcitonin:4,lacticidven:4)  ) Recent Results (from the past 240 hours)  Resp panel by RT-PCR (RSV, Flu A&B, Covid) Anterior Nasal Swab     Status: None   Collection Time: 08/10/24  9:27 PM   Specimen: Anterior Nasal Swab  Result Value Ref Range Status    SARS Coronavirus 2 by RT PCR NEGATIVE NEGATIVE Final   Influenza A by PCR NEGATIVE NEGATIVE Final   Influenza B by PCR NEGATIVE NEGATIVE Final    Comment: (NOTE) The Xpert Xpress SARS-CoV-2/FLU/RSV plus assay is intended as an aid in the diagnosis of influenza from Nasopharyngeal swab specimens and should not be used as a sole basis for treatment. Nasal washings and aspirates are unacceptable for Xpert Xpress SARS-CoV-2/FLU/RSV testing.  Fact Sheet for Patients: bloggercourse.com  Fact Sheet for Healthcare Providers: seriousbroker.it  This test is not yet approved or cleared by the [place]  FDA and has been authorized for detection and/or diagnosis of SARS-CoV-2 by FDA under an Emergency Use Authorization (EUA). This EUA will remain in effect (meaning this test can be used) for the duration of the COVID-19 declaration under Section 564(b)(1) of the Act, 21 U.S.C. section 360bbb-3(b)(1), unless the authorization is terminated or revoked.     Resp Syncytial Virus by PCR NEGATIVE NEGATIVE Final    Comment: (NOTE) Fact Sheet for Patients: bloggercourse.com  Fact Sheet for Healthcare Providers: seriousbroker.it  This test is not yet approved or cleared  by the [place]  FDA and has been authorized for detection and/or diagnosis of SARS-CoV-2 by FDA under an Emergency Use Authorization (EUA). This EUA will remain in effect (meaning this test can be used) for the duration of the COVID-19 declaration under Section 564(b)(1) of the Act, 21 U.S.C. section 360bbb-3(b)(1), unless the authorization is terminated or revoked.  Performed at [facility], [address], [place], [place] [address]   Group A Strep by PCR     Status: None   Collection Time: 08/10/24  9:27 PM   Specimen: Anterior Nasal Swab; Sterile Swab  Result Value Ref Range Status   Group A Strep by PCR NOT  DETECTED NOT DETECTED Final    Comment: Performed at [facility], [address], [place], [place] [address]  Respiratory (~20 pathogens) panel by PCR     Status: Abnormal   Collection Time: 08/11/24  1:32 AM   Specimen: Nasopharyngeal Swab; Respiratory  Result Value Ref Range Status   Adenovirus NOT DETECTED NOT DETECTED Final   Coronavirus 229E NOT DETECTED NOT DETECTED Final    Comment: (NOTE) The Coronavirus on the Respiratory Panel, DOES NOT test for the novel  Coronavirus (2019 nCoV)    Coronavirus HKU1 NOT DETECTED NOT DETECTED Final   Coronavirus NL63 NOT DETECTED NOT DETECTED Final   Coronavirus OC43 NOT DETECTED NOT DETECTED Final   Metapneumovirus NOT DETECTED NOT DETECTED Final   Rhinovirus / Enterovirus DETECTED (A) NOT DETECTED Final   Influenza A NOT DETECTED NOT DETECTED Final   Influenza B NOT DETECTED NOT DETECTED Final   Parainfluenza Virus 1 NOT DETECTED NOT DETECTED Final   Parainfluenza Virus 2 NOT DETECTED NOT DETECTED Final   Parainfluenza Virus 3 NOT DETECTED NOT DETECTED Final   Parainfluenza Virus 4 NOT DETECTED NOT DETECTED Final   Respiratory Syncytial Virus NOT DETECTED NOT DETECTED Final   Bordetella pertussis NOT DETECTED NOT DETECTED Final   Bordetella Parapertussis NOT DETECTED NOT DETECTED Final   Chlamydophila pneumoniae NOT DETECTED NOT DETECTED Final   Mycoplasma pneumoniae NOT DETECTED NOT DETECTED Final    Comment: Performed at [facility], [address], [place], [place] [address]     Radiology Studies: EP STUDY Result Date: 08/14/2024 See surgical note for result.    Scheduled Meds:  atorvastatin   20 mg Oral Daily   Chlorhexidine  Gluconate Cloth  6 each Topical Daily   hydrALAZINE   25 mg Oral Q8H   insulin  aspart  0-15 Units Subcutaneous TID WC   insulin  aspart  0-5 Units Subcutaneous QHS   insulin  aspart  5 Units Subcutaneous TID WC   insulin  glargine-yfgn  20 Units Subcutaneous Daily   isosorbide  mononitrate  90  mg Oral Daily   potassium chloride  SA  20 mEq Oral Daily   rivaroxaban   20 mg Oral Q supper   sodium chloride  flush  3 mL Intravenous Q12H   Continuous Infusions:  amiodarone  30 mg/hr (08/14/24 0243)   DOBUTamine 2.5 mcg/kg/min (08/13/24 2135)     LOS: 4 days    Time spent:    Sigurd Pac, MD [facility]   08/14/2024, 11:32 AM

## 2024-08-14 NOTE — Transfer of Care (Signed)
 Immediate Anesthesia Transfer of Care Note  Patient: Justin [name]  Procedure(s) Performed: TRANSESOPHAGEAL ECHOCARDIOGRAM CARDIOVERSION  Patient Location: PACU  Anesthesia Type:MAC  Level of Consciousness: drowsy  Airway & Oxygen Therapy: Patient Spontanous Breathing and Patient connected to face mask oxygen  Post-op Assessment: Report given to RN and Post -op Vital signs reviewed and stable  Post vital signs: Reviewed and stable  Last Vitals:  Vitals Value Taken Time  BP 138/100 08/14/24 09:41  Temp    Pulse 93 08/14/24 09:44  Resp 24 08/14/24 09:44  SpO2 98 % 08/14/24 09:44  Vitals shown include unfiled device data.  Last Pain:  Vitals:   08/14/24 0941  TempSrc:   PainSc: Asleep         Complications: No notable events documented.

## 2024-08-14 NOTE — Anesthesia Postprocedure Evaluation (Signed)
 Anesthesia Post Note  Patient: Justin [name]  Procedure(s) Performed: TRANSESOPHAGEAL ECHOCARDIOGRAM CARDIOVERSION     Patient location during evaluation: Cath Lab Anesthesia Type: MAC Level of consciousness: awake and alert Pain management: pain level controlled Vital Signs Assessment: post-procedure vital signs reviewed and stable Respiratory status: spontaneous breathing, nonlabored ventilation, respiratory function stable and patient connected to nasal cannula oxygen Cardiovascular status: blood pressure returned to baseline and stable Postop Assessment: no apparent nausea or vomiting Anesthetic complications: no   No notable events documented.  Last Vitals:  Vitals:   08/14/24 1020 08/14/24 1025  BP: (!) 143/110 (!) 135/100  Pulse: 86 93  Resp: (!) 25 18  Temp:    SpO2: 96% 95%    Last Pain:  Vitals:   08/14/24 0941  TempSrc:   PainSc: Asleep                 [name]

## 2024-08-15 ENCOUNTER — Encounter (HOSPITAL_COMMUNITY): Payer: Self-pay | Admitting: Internal Medicine

## 2024-08-15 LAB — BASIC METABOLIC PANEL WITH GFR
Anion gap: 13 (ref 5–15)
BUN: 27 mg/dL — ABNORMAL HIGH (ref 6–20)
CO2: 30 mmol/L (ref 22–32)
Calcium: 8.4 mg/dL — ABNORMAL LOW (ref 8.9–10.3)
Chloride: 93 mmol/L — ABNORMAL LOW (ref 98–111)
Creatinine, Ser: 2.16 mg/dL — ABNORMAL HIGH (ref 0.61–1.24)
GFR, Estimated: 38 mL/min — ABNORMAL LOW (ref >60)
Glucose, Bld: 153 mg/dL — ABNORMAL HIGH (ref 70–99)
Potassium: 3.6 mmol/L (ref 3.5–5.1)
Sodium: 136 mmol/L (ref 135–145)

## 2024-08-15 LAB — COOXEMETRY PANEL
Carboxyhemoglobin: 1.3 % (ref 0.5–1.5)
Carboxyhemoglobin: 1 % (ref 0.5–1.5)
Methemoglobin: 0.7 % (ref 0.0–1.5)
Methemoglobin: <0.7 % (ref 0.0–1.5)
O2 Saturation: 53.4 %
O2 Saturation: 47.6 %
Total hemoglobin: 16.8 g/dL — ABNORMAL HIGH (ref 12.0–16.0)
Total hemoglobin: 16.9 g/dL — ABNORMAL HIGH (ref 12.0–16.0)

## 2024-08-15 LAB — GLUCOSE, CAPILLARY
Glucose-Capillary: 166 mg/dL — ABNORMAL HIGH (ref 70–99)
Glucose-Capillary: 215 mg/dL — ABNORMAL HIGH (ref 70–99)
Glucose-Capillary: 199 mg/dL — ABNORMAL HIGH (ref 70–99)
Glucose-Capillary: 260 mg/dL — ABNORMAL HIGH (ref 70–99)

## 2024-08-15 MED ORDER — POTASSIUM CHLORIDE CRYS ER 20 MEQ PO TBCR: 40.0000 meq | EXTENDED_RELEASE_TABLET | Freq: Once | ORAL | Status: DC

## 2024-08-15 MED ORDER — POTASSIUM CHLORIDE CRYS ER 20 MEQ PO TBCR
40.0000 meq | EXTENDED_RELEASE_TABLET | Freq: Two times a day (BID) | ORAL | Status: DC
  Administered 2024-08-15 – 2024-08-20 (×11): 40 meq via ORAL
  Filled 2024-08-15 (×12): qty 2

## 2024-08-15 MED ORDER — INSULIN GLARGINE 100 UNIT/ML ~~LOC~~ SOLN
25.0000 [IU] | Freq: Every day | SUBCUTANEOUS | Status: DC
  Administered 2024-08-15 – 2024-08-16 (×2): 25 [IU] via SUBCUTANEOUS
  Filled 2024-08-15 (×2): qty 0.25

## 2024-08-15 MED ORDER — MELATONIN 3 MG PO TABS
3.0000 mg | ORAL_TABLET | Freq: Once | ORAL | Status: AC
  Administered 2024-08-15: 3 mg via ORAL
  Filled 2024-08-15: qty 1

## 2024-08-15 MED ORDER — INSULIN GLARGINE-YFGN 100 UNIT/ML ~~LOC~~ SOLN: 25.0000 [IU] | Freq: Every day | SUBCUTANEOUS | Status: DC

## 2024-08-15 MED ORDER — TORSEMIDE 20 MG PO TABS
40.0000 mg | ORAL_TABLET | Freq: Two times a day (BID) | ORAL | Status: DC
  Administered 2024-08-15 – 2024-08-20 (×11): 40 mg via ORAL
  Filled 2024-08-15 (×12): qty 2

## 2024-08-15 MED ORDER — HYDRALAZINE HCL 50 MG PO TABS
50.0000 mg | ORAL_TABLET | Freq: Three times a day (TID) | ORAL | Status: DC
  Administered 2024-08-15 – 2024-08-17 (×6): 50 mg via ORAL
  Filled 2024-08-15 (×6): qty 1

## 2024-08-15 MED ORDER — INSULIN ASPART 100 UNIT/ML IJ SOLN
6.0000 [IU] | Freq: Three times a day (TID) | INTRAMUSCULAR | Status: DC
  Administered 2024-08-15 – 2024-08-17 (×6): 6 [IU] via SUBCUTANEOUS
  Filled 2024-08-15 (×4): qty 6
  Filled 2024-08-15: qty 1
  Filled 2024-08-15 (×2): qty 6

## 2024-08-15 NOTE — Plan of Care (Signed)
  Problem: Education: Goal: Ability to describe self-care measures that may prevent or decrease complications (Diabetes Survival Skills Education) will improve Outcome: Progressing   Problem: Coping: Goal: Ability to adjust to condition or change in health will improve Outcome: Progressing   Problem: Fluid Volume: Goal: Ability to maintain a balanced intake and output will improve Outcome: Progressing   Problem: Education: Goal: Knowledge of General Education information will improve Description: Including pain rating scale, medication(s)/side effects and non-pharmacologic comfort measures Outcome: Progressing   Problem: Skin Integrity: Goal: Risk for impaired skin integrity will decrease Outcome: Progressing   Problem: Clinical Measurements: Goal: Cardiovascular complication will be avoided Outcome: Progressing

## 2024-08-15 NOTE — Progress Notes (Addendum)
 PROGRESS NOTE    Justin [name]  FMW:[id number] DOB: 05/28/1981 DOA: 08/10/2024 PCP: Celestia Rosaline SQUIBB, NP  [phone] with chronic systolic CHF, hypertension, type 2 diabetes mellitus, EtOH use, history of cocaine abuse multiple hospitalizations with CHF exacerbation, recently hospitalized 7/25 with CHF after being off cardiac meds, developed low output and cardiorenal syndrome then required milrinone  temporarily in the hospital.  Now readmitted with volume overload, low output, atrial flutter with RVR. - Initial Co. ox was 47 with creatinine of 2, heart failure team consulted he was started on dobutamine  and Lasix  - Respiratory virus panel positive for rhinovirus - Improving with diuresis - 12/4, underwent TEE/DCCV   Subjective: - Feels better overall, breathing continues to improve, Co. 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 ox 47, heart failure team consulting, started on low-dose dobutamine  and diuretics,  -Weight down 12 LB, starting oral torsemide  today, continue Imdur  and hydralazine  -Dobutamine  down to 1 yesterday, Co. 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 - Plan for outpatient cardiac PET scan to rule out sarcoidosis  Persistent atrial flutters/fibrillation - On IV amiodarone , Xarelto  - sp TEE/DCCV 12/4, now in NSR  AKI on CKD 4 - Baseline creatinine around 2, - Mild uptrend, continue dobutamine , diuretics  Hypokalemia Replete  Severe mitral regurgitation - Felt to be functional MR, TEE 7/25 noted severe eccentric posterior directed MR  History of EtOH and cocaine use - Allegedly has  quit  URI, rhinovirus - Supportive care  Type 2 diabetes mellitus - Continue glargine and meal coverage insulin , CBGs improving  Obesity, BMI is 45   DVT prophylaxis: Xarelto  Code Status: Full code Family Communication: None present Disposition Plan: Home likely early next week  Consultants:    Procedures:   Antimicrobials:    Objective: Vitals:   08/15/24 0505 08/15/24 0613 08/15/24 0700 08/15/24 0733  BP: (!) 139/98 (!) 146/99  (!) 127/109  Pulse:    99  Resp: 20   20  Temp: 98.8 F (37.1 C)  98.9 F (37.2 C) 98.9 F (37.2 C)  TempSrc: Oral  Oral Oral  SpO2: 93%   91%  Weight: 122.9 kg     Height:        Intake/Output Summary (Last 24 hours) at 08/15/2024 1022 Last data filed at 08/15/2024 0615 Gross per 24 hour  Intake 1197 ml  Output 650 ml  Net 547 ml   Filed Weights   08/13/24 0327 08/14/24 0359 08/15/24 0505  Weight: 125.7 kg 124.8 kg 122.9 kg    Examination:  General exam: Appears calm and comfortable, no distress HEENT: Neck obese unable to assess JVD Respiratory system: Few basilar rhonchi otherwise clear Cardiovascular system: S1 & S2 heard, regular rhythm Abd: nondistended, soft and nontender.Normal bowel sounds heard. Central nervous system: Alert and oriented. No focal neurological deficits. Extremities: Trace edema Skin: No rashes Psychiatry:  Mood & affect appropriate.     Data Reviewed:   CBC: Recent Labs  Lab 08/10/24 2148 08/11/24 0631 08/12/24 0448 08/13/24 0508 08/14/24 0507  WBC 11.5* 11.1* 7.1 4.9 5.2  HGB 16.8 16.4 15.9 15.5 15.8  HCT 50.5 48.8 47.6 46.0 47.0  MCV 90.2 90.0 91.0 90.0 89.9  PLT 228 212 190 185 201   Basic Metabolic Panel: Recent Labs  Lab 08/11/24 0631 08/12/24 0448 08/13/24 0508 08/14/24 0507 08/14/24 1546 08/15/24 0510  NA 131* 132* 136 137  --  136  K 3.7 3.6 3.0* 2.8* 3.4* 3.6  CL 95* 96* 95* 97*  --  93*  CO2 23 32 28 34*  --  30  GLUCOSE 373* 192* 170* 139*  --  153*  BUN 25*  28* 30* 25*  --  27*  CREATININE 2.09* 2.22* 2.04* 1.92*  --  2.16*  CALCIUM  8.1* 8.5* 8.9 8.4*  --  8.4*  MG 1.7 2.0 1.9  --   --   --    GFR: Estimated Creatinine Clearance: 54 mL/min (A) (by C-G formula based on SCr of 2.16 mg/dL (H)). Liver Function Tests: Recent Labs  Lab 08/11/24 2022  AST 25  ALT 22  ALKPHOS 88  BILITOT 0.6  PROT 7.0  ALBUMIN 2.9*   No results for input(s): LIPASE, AMYLASE in the last 168 hours. No results for input(s): AMMONIA in the last 168 hours. Coagulation Profile: No results for input(s): INR, PROTIME in the last 168 hours. Cardiac Enzymes: No results for input(s): CKTOTAL, CKMB, CKMBINDEX, TROPONINI in the last 168 hours. BNP (last 3 results) No results for input(s): PROBNP in the last 8760 hours. HbA1C: No results for input(s): HGBA1C in the last 72 hours.  CBG: Recent Labs  Lab 08/14/24 1153 08/14/24 1603 08/14/24 2052 08/14/24 2119 08/15/24 0607  GLUCAP 228* 273* 230* 224* 166*   Lipid Profile: No results for input(s): CHOL, HDL, LDLCALC, TRIG, CHOLHDL, LDLDIRECT in the last 72 hours. Thyroid  Function Tests: No results for input(s): TSH, T4TOTAL, FREET4, T3FREE, THYROIDAB in the last 72 hours. Anemia Panel: No results for input(s): VITAMINB12, FOLATE, FERRITIN, TIBC, IRON, RETICCTPCT in the last 72 hours. Urine analysis:    Component Value Date/Time   COLORURINE YELLOW 03/10/2024 0042   APPEARANCEUR CLEAR 03/10/2024 0042   LABSPEC 1.011 03/10/2024 0042   PHURINE 5.0 03/10/2024 0042   GLUCOSEU NEGATIVE 03/10/2024 0042   HGBUR NEGATIVE 03/10/2024 0042   BILIRUBINUR NEGATIVE 03/10/2024 0042   KETONESUR NEGATIVE 03/10/2024 0042   PROTEINUR 100 (A) 03/10/2024 0042   NITRITE NEGATIVE 03/10/2024 0042   LEUKOCYTESUR NEGATIVE 03/10/2024 0042   Sepsis Labs: @LABRCNTIP (procalcitonin:4,lacticidven:4)  ) Recent Results (from the past 240 hours)  Resp panel by RT-PCR (RSV, Flu  A&B, Covid) Anterior Nasal Swab     Status: None   Collection Time: 08/10/24  9:27 PM   Specimen: Anterior Nasal Swab  Result Value Ref Range Status   SARS Coronavirus 2 by RT PCR NEGATIVE NEGATIVE Final   Influenza A by PCR NEGATIVE NEGATIVE Final   Influenza B by PCR NEGATIVE NEGATIVE Final    Comment: (NOTE) The Xpert Xpress SARS-CoV-2/FLU/RSV plus assay is intended as an aid in the diagnosis of influenza from Nasopharyngeal swab specimens and should not be used as a sole basis for treatment. Nasal washings and aspirates are unacceptable for Xpert Xpress SARS-CoV-2/FLU/RSV testing.  Fact Sheet for Patients: bloggercourse.com  Fact Sheet for Healthcare Providers: seriousbroker.it  This test is not yet approved or cleared by the [place]  FDA and has been authorized for detection and/or diagnosis of SARS-CoV-2 by FDA under an Emergency Use Authorization (EUA). This EUA will remain in effect (meaning this test can be used) for the duration of the COVID-19 declaration under Section 564(b)(1) of the Act, 21  U.S.C. section 360bbb-3(b)(1), unless the authorization is terminated or revoked.     Resp Syncytial Virus by PCR NEGATIVE NEGATIVE Final    Comment: (NOTE) Fact Sheet for Patients: bloggercourse.com  Fact Sheet for Healthcare Providers: seriousbroker.it  This test is not yet approved or cleared by the [place]  FDA and has been authorized for detection and/or diagnosis of SARS-CoV-2 by FDA under an Emergency Use Authorization (EUA). This EUA will remain in effect (meaning this test can be used) for the duration of the COVID-19 declaration under Section 564(b)(1) of the Act, 21 U.S.C. section 360bbb-3(b)(1), unless the authorization is terminated or revoked.  Performed at [facility], [address] [place], [place] [address]   Group A Strep by PCR      Status: None   Collection Time: 08/10/24  9:27 PM   Specimen: Anterior Nasal Swab; Sterile Swab  Result Value Ref Range Status   Group A Strep by PCR NOT DETECTED NOT DETECTED Final    Comment: Performed at [facility], [address], [place], [place] [address]  Respiratory (~20 pathogens) panel by PCR     Status: Abnormal   Collection Time: 08/11/24  1:32 AM   Specimen: Nasopharyngeal Swab; Respiratory  Result Value Ref Range Status   Adenovirus NOT DETECTED NOT DETECTED Final   Coronavirus 229E NOT DETECTED NOT DETECTED Final    Comment: (NOTE) The Coronavirus on the Respiratory Panel, DOES NOT test for the novel  Coronavirus (2019 nCoV)    Coronavirus HKU1 NOT DETECTED NOT DETECTED Final   Coronavirus NL63 NOT DETECTED NOT DETECTED Final   Coronavirus OC43 NOT DETECTED NOT DETECTED Final   Metapneumovirus NOT DETECTED NOT DETECTED Final   Rhinovirus / Enterovirus DETECTED (A) NOT DETECTED Final   Influenza A NOT DETECTED NOT DETECTED Final   Influenza B NOT DETECTED NOT DETECTED Final   Parainfluenza Virus 1 NOT DETECTED NOT DETECTED Final   Parainfluenza Virus 2 NOT DETECTED NOT DETECTED Final   Parainfluenza Virus 3 NOT DETECTED NOT DETECTED Final   Parainfluenza Virus 4 NOT DETECTED NOT DETECTED Final   Respiratory Syncytial Virus NOT DETECTED NOT DETECTED Final   Bordetella pertussis NOT DETECTED NOT DETECTED Final   Bordetella Parapertussis NOT DETECTED NOT DETECTED Final   Chlamydophila pneumoniae NOT DETECTED NOT DETECTED Final   Mycoplasma pneumoniae NOT DETECTED NOT DETECTED Final    Comment: Performed at [facility], [address], [place], [place] [address]     Radiology Studies: ECHO TEE Result Date: 08/14/2024    TRANSESOPHOGEAL ECHO REPORT   Patient Name:   Justin [name] Date of Exam: 08/14/2024 Medical Rec #:  [id number]                      Height:       66.0 in Accession #:    [id number]                     Weight:       275.1 lb  Date of Birth:  1981-01-29                      BSA:          2.290 m Patient Age:    43 years                       BP:  156/112 mmHg Patient Gender: M                              HR:           118 bpm. Exam Location:  Inpatient Procedure: 2D Echo, Transesophageal Echo, Cardiac Doppler and Color Doppler            (Both Spectral and Color Flow Doppler were utilized during            procedure). Indications:     Arrhythmia  History:         Patient has prior history of Echocardiogram examinations, most                  recent 03/17/2024. CHF; Risk Factors:Hypertension.  Sonographer:     Jayson Gaskins Referring Phys:  [id number] DANIEL R BENSIMHON Diagnosing Phys: Toribio Fuel MD PROCEDURE: After discussion of the risks and benefits of a TEE, an informed consent was obtained from the patient. The transesophogeal probe was passed without difficulty through the esophogus of the patient. Sedation performed by different physician. The patient was monitored while under deep sedation. Anesthestetic sedation was provided intravenously by Anesthesiology: 170mg  of Propofol . The patient developed no complications during the procedure. A successful direct current cardioversion was performed at 200 joules with 1 attempt.  IMPRESSIONS  1. Left ventricular ejection fraction, by estimation, is 20 to 25%. The left ventricle has severely decreased function. The left ventricular internal cavity size was severely dilated. There is moderate left ventricular hypertrophy.  2. Right ventricular systolic function is moderately reduced. The right ventricular size is normal.  3. Left atrial size was severely dilated. No left atrial/left atrial appendage thrombus was detected.  4. Right atrial size was severely dilated.  5. The mitral valve is abnormal. Moderate mitral valve regurgitation.  6. The aortic valve is tricuspid. Aortic valve regurgitation is not visualized.  7. Rhythm strip during this exam demonstrates atrial flutter.  Conclusion(s)/Recommendation(s): No LA/LAA thrombus identified. Successful cardioversion performed with restoration of normal sinus rhythm. FINDINGS  Left Ventricle: Left ventricular ejection fraction, by estimation, is 20 to 25%. The left ventricle has severely decreased function. The left ventricular internal cavity size was severely dilated. There is moderate left ventricular hypertrophy. Right Ventricle: The right ventricular size is normal. No increase in right ventricular wall thickness. Right ventricular systolic function is moderately reduced. Left Atrium: Left atrial size was severely dilated. No left atrial/left atrial appendage thrombus was detected. Right Atrium: Right atrial size was severely dilated. Pericardium: There is no evidence of pericardial effusion. Mitral Valve: The mitral valve is abnormal. Moderate mitral valve regurgitation, with posteriorly-directed jet. Tricuspid Valve: The tricuspid valve is grossly normal. Tricuspid valve regurgitation is mild. Aortic Valve: The aortic valve is tricuspid. Aortic valve regurgitation is not visualized. Pulmonic Valve: The pulmonic valve was grossly normal. Pulmonic valve regurgitation is trivial. Aorta: The aortic root and ascending aorta are structurally normal, with no evidence of dilitation. IAS/Shunts: No atrial level shunt detected by color flow Doppler. There is no evidence of a patent foramen ovale. There is no evidence of an atrial septal defect. EKG: Rhythm strip during this exam demonstrates atrial flutter. Toribio Fuel MD Electronically signed by Toribio Fuel MD Signature Date/Time: 08/14/2024/5:08:58 PM    Final    EP STUDY Result Date: 08/14/2024 See surgical note for result.    Scheduled Meds:  atorvastatin   20 mg Oral Daily   Chlorhexidine  Gluconate  Cloth  6 each Topical Daily   hydrALAZINE   50 mg Oral Q8H   insulin  aspart  0-15 Units Subcutaneous TID WC   insulin  aspart  0-5 Units Subcutaneous QHS   insulin  aspart  6  Units Subcutaneous TID WC   insulin  glargine  25 Units Subcutaneous Daily   isosorbide  mononitrate  90 mg Oral Daily   potassium chloride   40 mEq Oral BID   rivaroxaban   20 mg Oral Q supper   sodium chloride  flush  3 mL Intravenous Q12H   torsemide   40 mg Oral BID   Continuous Infusions:  amiodarone  30 mg/hr (08/15/24 0329)   DOBUTamine  1 mcg/kg/min (08/14/24 1517)     LOS: 5 days    Time spent:    Sigurd Pac, MD [facility]   08/15/2024, 10:22 AM

## 2024-08-15 NOTE — TOC Progression Note (Signed)
 Transition of Care [facility]) - Progression Note    Patient Details  Name: Justin [name] MRN: [id number] Date of Birth: March 08, 1981  [facility] CM/SW Contact  [name], RN Phone Number:[phone] 08/15/2024, 3:18 PM  Clinical Narrative:    Patient admitted with Acute on Chronic HF, currently receiving amiodarone  and dobutamine  gtt. Lives at home with his Aunt.   [facility] will arrange hospital follow up appt with patient. Will notify PCP's office at dc to schedule appt.   Patient in uninsured at this time. Referral sent to Financial Counselor to screen for Medicaid. Will submit MATCH for assistance with medications at dc.   Will need continued CHF education prior to dc. Pt was given scale on 03/11/2024 and Living Better with HF (Spanish Version).    Expected Discharge Plan: Home/Self Care Barriers to Discharge: Continued Medical Work up    Expected Discharge Plan and Services In-house Referral: NA Discharge Planning Services: CM Consult, Follow-up appt scheduled, [facility], [facility] Post Acute Care Choice: NA Living arrangements for the past 2 months: Single Family Home                   DME Agency: NA       HH Arranged: NA           Social Drivers of Health (SDOH) Interventions SDOH Screenings   Food Insecurity: Food Insecurity Present (08/11/2024)  Housing: High Risk (08/11/2024)  Transportation Needs: No Transportation Needs (08/11/2024)  Utilities: Not At Risk (08/11/2024)  Alcohol Screen: Medium Risk (02/20/2022)  Depression (PHQ2-9): Low Risk  (10/12/2022)  Financial Resource Strain: Not at Risk (05/07/2024)   Received from [facility]  Physical Activity: At Risk (05/07/2024)   Received from [facility]  Social Connections: Not at Risk (05/07/2024)   Received from [facility]  Stress: Not at Risk (05/07/2024)   Received from [facility]  Tobacco Use: Low Risk  (08/10/2024)  Health Literacy: Inadequate Health Literacy  (09/21/2023)    Readmission Risk Interventions     No data to display

## 2024-08-15 NOTE — Progress Notes (Addendum)
 Advanced Heart Failure Rounding Note  Cardiologist: Shelda Bruckner, MD  AHF: Dr. Rolan  Chief Complaint: a/c systolic heart failure w/ low output    Patient Profile  43 y.o. Spanish-speaking male with history of chronic systolic CHF d/t NICM (prior cardiac MRI concerning for possible sarcoid but pt never completed f/u PET), uncontrolled HTN, DM II, mitral regurgitation, ETOH abuse, hx cocaine use, Afib/AFL and poor compliance, admitted w/ a/c CHF w/ marked volume overload and low output and was back in AFL w/ RVR on admission. Initial co-ox 47%. Started on DBA + amio gtts. Respiratory panel + for rhinovirus.   Subjective:   TEE/DC-CV 12/4 EF 20-25%, LV severely dilated, mod LVH, RV mod reduced, LA/RA severely dilated, mod MR. DCCV x1>NSR.   Remains on DBA @1 , cut back yesterday post DCCV. Co-ox 53%  Diuretics held yesterday.   Scr 2.22>>2.04  > 1.9>2.16 K 3.6  Feels ok this morning. Breathing better but still not back to baseline. Has been ambulating in the room.   Objective:   Weight Range: 122.9 kg Body mass index is 43.73 kg/m.   Vital Signs:   Temp:  [98.1 F (36.7 C)-98.9 F (37.2 C)] 98.9 F (37.2 C) (12/05 0733) Pulse Rate:  [86-107] 99 (12/05 0733) Resp:  [18-27] 20 (12/05 0733) BP: (111-146)/(72-110) 127/109 (12/05 0733) SpO2:  [91 %-99 %] 91 % (12/05 0733) Weight:  [122.9 kg] 122.9 kg (12/05 0505) Last BM Date : 08/13/24  Weight change: Filed Weights   08/13/24 0327 08/14/24 0359 08/15/24 0505  Weight: 125.7 kg 124.8 kg 122.9 kg    Intake/Output:   Intake/Output Summary (Last 24 hours) at 08/15/2024 0807 Last data filed at 08/15/2024 0615 Gross per 24 hour  Intake 1197 ml  Output 650 ml  Net 547 ml    Physical Exam  General:  chronically ill appearing.  No respiratory difficulty Neck: JVD ~8 cm.  Cor: Regular rate & rhythm. No murmurs. Lungs: insp/exp wheezes. L>R.  Extremities: +1 BLE edema  Neuro: alert & oriented x 3. Affect  pleasant.   Telemetry   ST low 100s (Personally reviewed)    Labs    CBC Recent Labs    08/13/24 0508 08/14/24 0507  WBC 4.9 5.2  HGB 15.5 15.8  HCT 46.0 47.0  MCV 90.0 89.9  PLT 185 201   Basic Metabolic Panel Recent Labs    87/96/74 0508 08/14/24 0507 08/14/24 1546 08/15/24 0510  NA 136 137  --  136  K 3.0* 2.8* 3.4* 3.6  CL 95* 97*  --  93*  CO2 28 34*  --  30  GLUCOSE 170* 139*  --  153*  BUN 30* 25*  --  27*  CREATININE 2.04* 1.92*  --  2.16*  CALCIUM  8.9 8.4*  --  8.4*  MG 1.9  --   --   --    Liver Function Tests No results for input(s): AST, ALT, ALKPHOS, BILITOT, PROT, ALBUMIN in the last 72 hours.  No results for input(s): LIPASE, AMYLASE in the last 72 hours. Cardiac Enzymes No results for input(s): CKTOTAL, CKMB, CKMBINDEX, TROPONINI in the last 72 hours.  BNP: BNP (last 3 results) Recent Labs    03/09/24 1845 03/24/24 1637 08/10/24 2148  BNP 2,578.5* 1,035.3* 193.5*    ProBNP (last 3 results) No results for input(s): PROBNP in the last 8760 hours.   D-Dimer No results for input(s): DDIMER in the last 72 hours. Hemoglobin A1C No results for input(s): HGBA1C in  the last 72 hours.  Fasting Lipid Panel No results for input(s): CHOL, HDL, LDLCALC, TRIG, CHOLHDL, LDLDIRECT in the last 72 hours. Thyroid  Function Tests No results for input(s): TSH, T4TOTAL, T3FREE, THYROIDAB in the last 72 hours.  Invalid input(s): FREET3  Other results:  Imaging   ECHO TEE Result Date: 08/14/2024    TRANSESOPHOGEAL ECHO REPORT   Patient Name:   Justin [name] Date of Exam: 08/14/2024 Medical Rec #:  [id number]                      Height:       66.0 in Accession #:    [id number]                     Weight:       275.1 lb Date of Birth:  08/26/1981                      BSA:          2.290 m Patient Age:    43 years                       BP:           156/112 mmHg Patient Gender: M                               HR:           118 bpm. Exam Location:  Inpatient Procedure: 2D Echo, Transesophageal Echo, Cardiac Doppler and Color Doppler            (Both Spectral and Color Flow Doppler were utilized during            procedure). Indications:     Arrhythmia  History:         Patient has prior history of Echocardiogram examinations, most                  recent 03/17/2024. CHF; Risk Factors:Hypertension.  Sonographer:     Jayson Gaskins Referring Phys:  [id number] Maalik Pinn R Gianah Batt Diagnosing Phys: Toribio Fuel MD PROCEDURE: After discussion of the risks and benefits of a TEE, an informed consent was obtained from the patient. The transesophogeal probe was passed without difficulty through the esophogus of the patient. Sedation performed by different physician. The patient was monitored while under deep sedation. Anesthestetic sedation was provided intravenously by Anesthesiology: 170mg  of Propofol . The patient developed no complications during the procedure. A successful direct current cardioversion was performed at 200 joules with 1 attempt.  IMPRESSIONS  1. Left ventricular ejection fraction, by estimation, is 20 to 25%. The left ventricle has severely decreased function. The left ventricular internal cavity size was severely dilated. There is moderate left ventricular hypertrophy.  2. Right ventricular systolic function is moderately reduced. The right ventricular size is normal.  3. Left atrial size was severely dilated. No left atrial/left atrial appendage thrombus was detected.  4. Right atrial size was severely dilated.  5. The mitral valve is abnormal. Moderate mitral valve regurgitation.  6. The aortic valve is tricuspid. Aortic valve regurgitation is not visualized.  7. Rhythm strip during this exam demonstrates atrial flutter. Conclusion(s)/Recommendation(s): No LA/LAA thrombus identified. Successful cardioversion performed with restoration of normal sinus rhythm. FINDINGS  Left Ventricle: Left  ventricular ejection fraction, by estimation, is 20 to 25%. The left ventricle  has severely decreased function. The left ventricular internal cavity size was severely dilated. There is moderate left ventricular hypertrophy. Right Ventricle: The right ventricular size is normal. No increase in right ventricular wall thickness. Right ventricular systolic function is moderately reduced. Left Atrium: Left atrial size was severely dilated. No left atrial/left atrial appendage thrombus was detected. Right Atrium: Right atrial size was severely dilated. Pericardium: There is no evidence of pericardial effusion. Mitral Valve: The mitral valve is abnormal. Moderate mitral valve regurgitation, with posteriorly-directed jet. Tricuspid Valve: The tricuspid valve is grossly normal. Tricuspid valve regurgitation is mild. Aortic Valve: The aortic valve is tricuspid. Aortic valve regurgitation is not visualized. Pulmonic Valve: The pulmonic valve was grossly normal. Pulmonic valve regurgitation is trivial. Aorta: The aortic root and ascending aorta are structurally normal, with no evidence of dilitation. IAS/Shunts: No atrial level shunt detected by color flow Doppler. There is no evidence of a patent foramen ovale. There is no evidence of an atrial septal defect. EKG: Rhythm strip during this exam demonstrates atrial flutter. Toribio Fuel MD Electronically signed by Toribio Fuel MD Signature Date/Time: 08/14/2024/5:08:58 PM    Final    EP STUDY Result Date: 08/14/2024 See surgical note for result.   Medications:   Scheduled Medications:  atorvastatin   20 mg Oral Daily   Chlorhexidine  Gluconate Cloth  6 each Topical Daily   hydrALAZINE   25 mg Oral Q8H   insulin  aspart  0-15 Units Subcutaneous TID WC   insulin  aspart  0-5 Units Subcutaneous QHS   insulin  aspart  5 Units Subcutaneous TID WC   insulin  glargine-yfgn  20 Units Subcutaneous Daily   isosorbide  mononitrate  90 mg Oral Daily   potassium chloride  SA   20 mEq Oral Daily   rivaroxaban   20 mg Oral Q supper   sodium chloride  flush  3 mL Intravenous Q12H    Infusions:  amiodarone  30 mg/hr (08/15/24 0329)   DOBUTamine  1 mcg/kg/min (08/14/24 1517)    PRN Medications: acetaminophen  **OR** acetaminophen , guaiFENesin , mouth rinse, oxyCODONE , prochlorperazine , senna, sodium chloride  flush  Assessment/Plan   1. Acute on Chronic systolic CHF: Long history of nonischemic cardiomyopathy. Cath in 6/23 with no significant CAD, CI 1.99.  Substance abuse (cocaine and ETOH) thought to play a role in cardiomyopathy, also uncontrolled HTN in the past.  However, cardiac MRI was done in 6/23 showing LVEF 29%, LV severely dilated, asymmetric LVH with basal septum measuring 17 mm, RVEF 45%, patchy LGE in septum and RV insertion sites + subepicardial LGE in inferolateral wall. Scar pattern and asymmetric hypertrophy could be consistent with hypertrophic cardiomyopathy. However, due to subepicardial LGE in inferolateral wall and presence of mediastinal lymphadenopathy, cardiac PET recommended to rule out cardiac sarcoidosis.  This was never done due to lack of insurance.  Echo in 7/25 showed EF 20-25% with moderate concentric LVH, severe RV dysfunction, probably severe functional MR with PISA ERO 0.47 cm^2, IVC dilated.  Low output HF noted at 7/25 admission, he was started on milrinone  and diuresed, milrinone  eventually discontinued.  Now back w/ a/c CHF w/ low output, NYHA Class IIIb symptoms, in setting of recurrent AFL w/ RVR. Initial Co-ox 47%.  - DBA down to 1. Co-ox marginal at 53%. Repeat.  - Volume ok still mildly elevated. Start Torsemide  40 mg BID.  - GDMT limited by CKD stage 4. - Continue Imdur  90 mg daily   - Increase Hydralazine  25>50 mg tid  - No ? blocker w/ low output  - Not SGLT2i candidate with A1c  13.9% - Place TED hose.  - Would like to get a cardiac PET to rule out cardiac sarcoidosis, but this is probably not an option with no insurance. -  Not a good candidate for advanced therapies with noncompliance, lack of insurance, RV dysfunction and CKD stage 4.   2. Atrial fibrillation/atypical flutter: Persistent atypical atrial flutter in 7/25, TEE-guided DCCV back to NSR but back in AFL w/ RVR this admission.   - continue IV amio while on DBA - Continue Xarelto . Discussed importance of picking up calls from drug company to ensure delivery. PharmD at bedside.  - TEE/DC-CV 12/4 EF 20-25%, LV severely dilated, mod LVH, RV mod reduced, LA/RA severely dilated, mod MR. DCCV x1>NSR.  - Eventual AF ablation - Check EKG   3. CKD stage IV: Suspect cardiorenal syndrome + DM2 + HTN.  Last creatinine prior to this admit was 2.4. SCr 2.0 at time of this admit, stable w/ diuresis 1.9 today. Baseline Scr 2.5-3.0 - follow BMP daily   - support CO w/ DBA    4. Mitral regurgitation: Severe functional MR.  TEE 7/25 showed severe eccentric, posteriorly-directed MR with restricted posterior leaflet, MR ERO 0.6 cm^2 by PISA.  - continue HF/AFL optimization w/ diuresis and repeat DCCV    5. ETOH/cocaine abuse: Says he has quit. UDS negative on admission    6. DM2: Poor control.   - Insulin  per IM  - hgba1c 13.9%   7. Rhinovirus Infection:  - supportive care   8. Presumed CAP - completed 3 day course of abx per IM   9. Hypokalemia - K 3.6 - will supp today  Length of Stay: 5  Beckey LITTIE Coe, NP  08/15/2024, 8:07 AM  Advanced Heart Failure Team Pager [phone] (M-F; 7a - 5p)  Please contact [facility] for night-coverage after hours (5p -7a ) and weekends on [email or web address]  Patient seen and examined with the above-signed Advanced Practice Provider and/or Housestaff. I personally reviewed laboratory data, imaging studies and relevant notes. I independently examined the patient and formulated the important aspects of the plan. I have edited the note to reflect any of my changes or salient points. I have personally discussed the plan with the patient  and/or family.  Underwent DC-CV yesterday. Remains in NSR,   Dobutamine  cut from 2.5 -> 1.0 mck/kg/min  Feels ok but co-ox has dropped. CVP 10-12  General:  Sitting up in bed. No resp difficulty HEENT: normal Neck: supple. JVP jaw.  Cor: Regular rate & rhythm. No rubs, gallops or murmurs. Lungs: clear Abdomen: obese soft, nontender, nondistended.Good bowel sounds. Extremities: no cyanosis, clubbing, rash, tr edema Neuro: alert & orientedx3, cranial nerves grossly intact. moves all 4 extremities w/o difficulty. Affect pleasant  He is back in rhythm but co-ox has dropped on lower dose of DBA. Will increase DBA up to 2 mcg/kg/min. Would continue this dose through the weekend and attempt to wean again next week. Will increase with hydralazine . May be able to try low-dose ARB but have to watch Scr closely. Not candidate for SGLT2i due to A1c 13.9%.   Not candidate for advanced therapies or home inotropes.   Toribio Fuel, MD  9:56 PM

## 2024-08-15 NOTE — Plan of Care (Signed)
  Problem: Coping: Goal: Ability to adjust to condition or change in health will improve Outcome: Progressing   Problem: Nutritional: Goal: Maintenance of adequate nutrition will improve Outcome: Progressing   Problem: Activity: Goal: Risk for activity intolerance will decrease Outcome: Progressing

## 2024-08-16 LAB — GLUCOSE, CAPILLARY
Glucose-Capillary: 173 mg/dL — ABNORMAL HIGH (ref 70–99)
Glucose-Capillary: 259 mg/dL — ABNORMAL HIGH (ref 70–99)
Glucose-Capillary: 162 mg/dL — ABNORMAL HIGH (ref 70–99)
Glucose-Capillary: 235 mg/dL — ABNORMAL HIGH (ref 70–99)

## 2024-08-16 LAB — BASIC METABOLIC PANEL WITH GFR
Anion gap: 11 (ref 5–15)
BUN: 25 mg/dL — ABNORMAL HIGH (ref 6–20)
CO2: 31 mmol/L (ref 22–32)
Calcium: 8.5 mg/dL — ABNORMAL LOW (ref 8.9–10.3)
Chloride: 93 mmol/L — ABNORMAL LOW (ref 98–111)
Creatinine, Ser: 2.15 mg/dL — ABNORMAL HIGH (ref 0.61–1.24)
GFR, Estimated: 38 mL/min — ABNORMAL LOW (ref >60)
Glucose, Bld: 190 mg/dL — ABNORMAL HIGH (ref 70–99)
Potassium: 3.7 mmol/L (ref 3.5–5.1)
Sodium: 135 mmol/L (ref 135–145)

## 2024-08-16 LAB — COOXEMETRY PANEL
Carboxyhemoglobin: 2.8 % — ABNORMAL HIGH (ref 0.5–1.5)
Methemoglobin: <0.7 % (ref 0.0–1.5)
O2 Saturation: 73 %
Total hemoglobin: 15.8 g/dL (ref 12.0–16.0)

## 2024-08-16 MED ORDER — INSULIN GLARGINE 100 UNIT/ML ~~LOC~~ SOLN
5.0000 [IU] | Freq: Once | SUBCUTANEOUS | Status: AC
  Administered 2024-08-16: 5 [IU] via SUBCUTANEOUS
  Filled 2024-08-16: qty 0.05

## 2024-08-16 MED ORDER — INSULIN GLARGINE 100 UNIT/ML ~~LOC~~ SOLN
30.0000 [IU] | Freq: Every day | SUBCUTANEOUS | Status: DC
  Administered 2024-08-17: 30 [IU] via SUBCUTANEOUS
  Filled 2024-08-16: qty 0.3

## 2024-08-16 NOTE — Progress Notes (Signed)
 Advanced Heart Failure Rounding Note  Cardiologist: Shelda Bruckner, MD  AHF: Dr. Rolan  Chief Complaint: a/c systolic heart failure w/ low output    Patient Profile  43 y.o. Spanish-speaking male with history of chronic systolic CHF d/t NICM (prior cardiac MRI concerning for possible sarcoid but pt never completed f/u PET), uncontrolled HTN, DM II, mitral regurgitation, ETOH abuse, hx cocaine use, Afib/AFL and poor compliance, admitted w/ a/c CHF w/ marked volume overload and low output and was back in AFL w/ RVR on admission. Initial co-ox 47%. Started on DBA + amio gtts. Respiratory panel + for rhinovirus.   Subjective:   TEE/DC-CV 12/4 EF 20-25%, LV severely dilated, mod LVH, RV mod reduced, LA/RA severely dilated, mod MR. DCCV x1>NSR.   DBA increased to 2 yesterday due to low co-ox. Co-ox 73% today.   Feels ok.  Nw on po diuretics.   Maintaining NSR   Objective:   Weight Range: 121.5 kg Body mass index is 43.23 kg/m.   Vital Signs:   Temp:  [98 F (36.7 C)-98.2 F (36.8 C)] 98.2 F (36.8 C) (12/06 0800) Pulse Rate:  [90-98] 90 (12/06 0800) Resp:  [19-20] 20 (12/06 0800) BP: (113-149)/(88-97) 148/94 (12/06 0800) SpO2:  [90 %-93 %] 93 % (12/06 0800) Weight:  [121.5 kg] 121.5 kg (12/06 0300) Last BM Date : 08/13/24  Weight change: Filed Weights   08/14/24 0359 08/15/24 0505 08/16/24 0300  Weight: 124.8 kg 122.9 kg 121.5 kg    Intake/Output:   Intake/Output Summary (Last 24 hours) at 08/16/2024 1010 Last data filed at 08/16/2024 0802 Gross per 24 hour  Intake 480 ml  Output 2800 ml  Net -2320 ml    Physical Exam   General:  Sitting up in bed. No resp difficulty HEENT: normal Neck: supple. no JVD.  Cor: Regular rate & rhythm. No rubs, gallops or murmurs. Lungs: clear Abdomen: obese soft, nontender, nondistended.Good bowel sounds. Extremities: no cyanosis, clubbing, rash, tr edema Neuro: alert & orientedx3, cranial nerves grossly intact. moves  all 4 extremities w/o difficulty. Affect pleasant   Telemetry   Sinus 90-100 Personally reviewed  Labs    CBC Recent Labs    08/14/24 0507  WBC 5.2  HGB 15.8  HCT 47.0  MCV 89.9  PLT 201   Basic Metabolic Panel Recent Labs    87/94/74 0510 08/16/24 0500  NA 136 135  K 3.6 3.7  CL 93* 93*  CO2 30 31  GLUCOSE 153* 190*  BUN 27* 25*  CREATININE 2.16* 2.15*  CALCIUM  8.4* 8.5*   Liver Function Tests No results for input(s): AST, ALT, ALKPHOS, BILITOT, PROT, ALBUMIN in the last 72 hours.  No results for input(s): LIPASE, AMYLASE in the last 72 hours. Cardiac Enzymes No results for input(s): CKTOTAL, CKMB, CKMBINDEX, TROPONINI in the last 72 hours.  BNP: BNP (last 3 results) Recent Labs    03/09/24 1845 03/24/24 1637 08/10/24 2148  BNP 2,578.5* 1,035.3* 193.5*    ProBNP (last 3 results) No results for input(s): PROBNP in the last 8760 hours.   D-Dimer No results for input(s): DDIMER in the last 72 hours. Hemoglobin A1C No results for input(s): HGBA1C in the last 72 hours.  Fasting Lipid Panel No results for input(s): CHOL, HDL, LDLCALC, TRIG, CHOLHDL, LDLDIRECT in the last 72 hours. Thyroid  Function Tests No results for input(s): TSH, T4TOTAL, T3FREE, THYROIDAB in the last 72 hours.  Invalid input(s): FREET3  Other results:  Imaging   No results found.  Medications:   Scheduled Medications:  atorvastatin   20 mg Oral Daily   Chlorhexidine  Gluconate Cloth  6 each Topical Daily   hydrALAZINE   50 mg Oral Q8H   insulin  aspart  0-15 Units Subcutaneous TID WC   insulin  aspart  0-5 Units Subcutaneous QHS   insulin  aspart  6 Units Subcutaneous TID WC   insulin  glargine  25 Units Subcutaneous Daily   isosorbide  mononitrate  90 mg Oral Daily   potassium chloride   40 mEq Oral BID   rivaroxaban   20 mg Oral Q supper   sodium chloride  flush  3 mL Intravenous Q12H   torsemide   40 mg Oral BID     Infusions:  amiodarone  30 mg/hr (08/16/24 0519)   DOBUTamine  2 mcg/kg/min (08/15/24 1441)    PRN Medications: acetaminophen  **OR** acetaminophen , guaiFENesin , mouth rinse, oxyCODONE , prochlorperazine , senna, sodium chloride  flush  Assessment/Plan   1. Acute on Chronic systolic CHF: Long history of nonischemic cardiomyopathy. Cath in 6/23 with no significant CAD, CI 1.99.  Substance abuse (cocaine and ETOH) thought to play a role in cardiomyopathy, also uncontrolled HTN in the past.  However, cardiac MRI was done in 6/23 showing LVEF 29%, LV severely dilated, asymmetric LVH with basal septum measuring 17 mm, RVEF 45%, patchy LGE in septum and RV insertion sites + subepicardial LGE in inferolateral wall. Scar pattern and asymmetric hypertrophy could be consistent with hypertrophic cardiomyopathy. However, due to subepicardial LGE in inferolateral wall and presence of mediastinal lymphadenopathy, cardiac PET recommended to rule out cardiac sarcoidosis.  This was never done due to lack of insurance.  Echo in 7/25 showed EF 20-25% with moderate concentric LVH, severe RV dysfunction, probably severe functional MR with PISA ERO 0.47 cm^2, IVC dilated.  Low output HF noted at 7/25 admission, he was started on milrinone  and diuresed, milrinone  eventually discontinued.  Now back w/ a/c CHF w/ low output, NYHA Class IIIb symptoms, in setting of recurrent AFL w/ RVR. Initial Co-ox 47%.  - Failed DBA wean on 12/5. DBA turned back up to 2 and hydral increased. Co-ox improved to 73% - Continue DBA at 2. Possibly attempt rewean tomorrow.  - Volume ok continue  Torsemide  40 mg BID.  - GDMT limited by CKD stage 4. - Continue Imdur  90 mg daily   - Increase Hydralazine  50-> 75 mg tid  - No ? blocker w/ low output  - Not SGLT2i candidate with A1c 13.9%  - Likely considr gentle addition of ARB if Scr stable - Would like to get a cardiac PET to rule out cardiac sarcoidosis, but this is probably not an option with  no insurance. - Not a good candidate for advanced therapies with noncompliance, lack of insurance, RV dysfunction and CKD stage 4.   2. Atrial fibrillation/atypical flutter: Persistent atypical atrial flutter in 7/25, TEE-guided DCCV back to NSR but back in AFL w/ RVR this admission.   - continue IV amio while on DBA - Continue Xarelto . Discussed importance of picking up calls from drug company to ensure delivery. PharmD at bedside.  - TEE/DC-CV 12/4 EF 20-25%, LV severely dilated, mod LVH, RV mod reduced, LA/RA severely dilated, mod MR. DCCV x1>NSR.  - Remains in NSR  3. CKD stage IV: Suspect cardiorenal syndrome + DM2 + HTN.  Last creatinine prior to this admit was 2.4. SCr 2.0 at time of this admit, stable w/ diuresis 2.15 today. Baseline Scr 2.5-3.0 - follow BMP daily   - support CO w/ DBA    4. Mitral regurgitation:  Severe functional MR.  TEE 7/25 showed severe eccentric, posteriorly-directed MR with restricted posterior leaflet, MR ERO 0.6 cm^2 by PISA.  - continue HF/AFL optimization w/ diuresis and repeat DCCV    5. ETOH/cocaine abuse: Says he has quit. UDS negative on admission    6. DM2: Poor control.   - Insulin  per IM  - hgba1c 13.9%   7. Rhinovirus Infection:  - supportive care   8. Presumed CAP - completed 3 day course of abx per IM   9. Hypokalemia - K 3.7 - supp as needed  Length of Stay: 6  Toribio Fuel, MD  08/16/2024, 10:10 AM  Advanced Heart Failure Team Pager [phone] (M-F; 7a - 5p)  Please contact [facility] for night-coverage after hours (5p -7a ) and weekends on [email or web address]  Patient seen and examined with the above-signed Advanced Practice Provider and/or Housestaff. I personally reviewed laboratory data, imaging studies and relevant notes. I independently examined the patient and formulated the important aspects of the plan. I have edited the note to reflect any of my changes or salient points. I have personally discussed the plan with the  patient and/or family.  Underwent DC-CV yesterday. Remains in NSR,   Dobutamine  cut from 2.5 -> 1.0 mck/kg/min  Feels ok but co-ox has dropped. CVP 10-12  General:  Sitting up in bed. No resp difficulty HEENT: normal Neck: supple. JVP jaw.  Cor: Regular rate & rhythm. No rubs, gallops or murmurs. Lungs: clear Abdomen: obese soft, nontender, nondistended.Good bowel sounds. Extremities: no cyanosis, clubbing, rash, tr edema Neuro: alert & orientedx3, cranial nerves grossly intact. moves all 4 extremities w/o difficulty. Affect pleasant  He is back in rhythm but co-ox has dropped on lower dose of DBA. Will increase DBA up to 2 mcg/kg/min. Would continue this dose through the weekend and attempt to wean again next week. Will increase with hydralazine . May be able to try low-dose ARB but have to watch Scr closely. Not candidate for SGLT2i due to A1c 13.9%.   Not candidate for advanced therapies or home inotropes.   Toribio Fuel, MD  10:10 AM

## 2024-08-16 NOTE — Progress Notes (Signed)
 PROGRESS NOTE    Justin [name]  FMW:[id number] DOB: 08-Apr-1981 DOA: 08/10/2024 PCP: Celestia Rosaline SQUIBB, NP  [phone] with chronic systolic CHF, hypertension, type 2 diabetes mellitus, EtOH use, history of cocaine abuse multiple hospitalizations with CHF exacerbation, recently hospitalized 7/25 with CHF after being off cardiac meds, developed low output and cardiorenal syndrome then required milrinone  temporarily in the hospital.  Now readmitted with volume overload, low output, atrial flutter with RVR. - Initial Co. ox was 47 with creatinine of 2, heart failure team consulted he was started on dobutamine  and Lasix  - Respiratory virus panel positive for rhinovirus - Improving with diuresis - 12/4, underwent TEE/DCCV, dobutamine  dose decreased to 1 mcg - 12/5: Co. ox dropped, dobutamine  dose increased to 2   Subjective: - Feels better overall, breathing continues to improve, wondering when he can go home  Assessment and Plan:  Acute on chronic systolic CHF, BiV failure Severe mitral regurgitation -known NICM, no significant CAD on cath 6/23 - Cardiomyopathy felt to be related to EtOH, cocaine and uncontrolled hypertension - Recent echo 7/25 noted EF 20-25%, moderate LVH, severely reduced RV, severe MR - Recent admission with low output required milrinone  - Initial Co. ox 47, heart failure team consulting, started on low-dose dobutamine  and diuretics,  -Weight down 12 LB, starting oral torsemide  today, continue Imdur  and hydralazine  -Dobutamine  down to 1 on 12/4, Co. 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 - Plan for outpatient cardiac PET scan to rule out sarcoidosis  Persistent atrial flutters/fibrillation - On IV amiodarone , Xarelto  - sp TEE/DCCV 12/4, now in NSR  AKI on CKD 4 - Baseline creatinine around 2, - Mild uptrend, continue dobutamine ,  diuretics  Hypokalemia Replete  Severe mitral regurgitation - Felt to be functional MR, TEE 7/25 noted severe eccentric posterior directed MR  History of EtOH and cocaine use - Allegedly has quit  URI, rhinovirus - Supportive care, resolved  Type 2 diabetes mellitus - Continue glargine and meal coverage insulin , CBGs improving, dose increased further  Obesity, BMI is 45   DVT prophylaxis: Xarelto  Code Status: Full code Family Communication: None present Disposition Plan: Home likely early next week  Consultants:    Procedures:   Antimicrobials:    Objective: Vitals:   08/15/24 2300 08/16/24 0300 08/16/24 0633 08/16/24 0800  BP: (!) 141/94 (!) 149/97 (!) 148/96 (!) 148/94  Pulse: 95 98  90  Resp: 19 19  20   Temp: 98 F (36.7 C) 98.1 F (36.7 C)  98.2 F (36.8 C)  TempSrc: Oral Oral  Oral  SpO2: 90% 91%  93%  Weight:  121.5 kg    Height:  5' 6 (1.676 m)      Intake/Output Summary (Last 24 hours) at 08/16/2024 1034 Last data filed at 08/16/2024 0802 Gross per 24 hour  Intake 480 ml  Output 2800 ml  Net -2320 ml   Filed Weights   08/14/24 0359 08/15/24 0505 08/16/24 0300  Weight: 124.8 kg 122.9 kg 121.5 kg    Examination:  General exam: Appears calm and comfortable, no distress HEENT: Neck obese unable to assess JVD Respiratory system: Clear today Cardiovascular system: S1 & S2 heard, regular rhythm Abd: nondistended, soft and nontender.Normal bowel sounds heard. Central nervous system: Alert and oriented. No focal neurological deficits. Extremities: Trace edema Skin: No rashes Psychiatry:  Mood & affect appropriate.     Data Reviewed:   CBC:  Recent Labs  Lab 08/10/24 2148 08/11/24 0631 08/12/24 0448 08/13/24 0508 08/14/24 0507  WBC 11.5* 11.1* 7.1 4.9 5.2  HGB 16.8 16.4 15.9 15.5 15.8  HCT 50.5 48.8 47.6 46.0 47.0  MCV 90.2 90.0 91.0 90.0 89.9  PLT 228 212 190 185 201   Basic Metabolic Panel: Recent Labs  Lab 08/11/24 0631  08/12/24 0448 08/13/24 0508 08/14/24 0507 08/14/24 1546 08/15/24 0510 08/16/24 0500  NA 131* 132* 136 137  --  136 135  K 3.7 3.6 3.0* 2.8* 3.4* 3.6 3.7  CL 95* 96* 95* 97*  --  93* 93*  CO2 23 32 28 34*  --  30 31  GLUCOSE 373* 192* 170* 139*  --  153* 190*  BUN 25* 28* 30* 25*  --  27* 25*  CREATININE 2.09* 2.22* 2.04* 1.92*  --  2.16* 2.15*  CALCIUM  8.1* 8.5* 8.9 8.4*  --  8.4* 8.5*  MG 1.7 2.0 1.9  --   --   --   --    GFR: Estimated Creatinine Clearance: 53.9 mL/min (A) (by C-G formula based on SCr of 2.15 mg/dL (H)). Liver Function Tests: Recent Labs  Lab 08/11/24 2022  AST 25  ALT 22  ALKPHOS 88  BILITOT 0.6  PROT 7.0  ALBUMIN 2.9*   No results for input(s): LIPASE, AMYLASE in the last 168 hours. No results for input(s): AMMONIA in the last 168 hours. Coagulation Profile: No results for input(s): INR, PROTIME in the last 168 hours. Cardiac Enzymes: No results for input(s): CKTOTAL, CKMB, CKMBINDEX, TROPONINI in the last 168 hours. BNP (last 3 results) No results for input(s): PROBNP in the last 8760 hours. HbA1C: No results for input(s): HGBA1C in the last 72 hours.  CBG: Recent Labs  Lab 08/15/24 0607 08/15/24 1128 08/15/24 1647 08/15/24 2140 08/16/24 0618  GLUCAP 166* 215* 199* 260* 173*   Lipid Profile: No results for input(s): CHOL, HDL, LDLCALC, TRIG, CHOLHDL, LDLDIRECT in the last 72 hours. Thyroid  Function Tests: No results for input(s): TSH, T4TOTAL, FREET4, T3FREE, THYROIDAB in the last 72 hours. Anemia Panel: No results for input(s): VITAMINB12, FOLATE, FERRITIN, TIBC, IRON, RETICCTPCT in the last 72 hours. Urine analysis:    Component Value Date/Time   COLORURINE YELLOW 03/10/2024 0042   APPEARANCEUR CLEAR 03/10/2024 0042   LABSPEC 1.011 03/10/2024 0042   PHURINE 5.0 03/10/2024 0042   GLUCOSEU NEGATIVE 03/10/2024 0042   HGBUR NEGATIVE 03/10/2024 0042   BILIRUBINUR NEGATIVE  03/10/2024 0042   KETONESUR NEGATIVE 03/10/2024 0042   PROTEINUR 100 (A) 03/10/2024 0042   NITRITE NEGATIVE 03/10/2024 0042   LEUKOCYTESUR NEGATIVE 03/10/2024 0042   Sepsis Labs: @LABRCNTIP (procalcitonin:4,lacticidven:4)  ) Recent Results (from the past 240 hours)  Resp panel by RT-PCR (RSV, Flu A&B, Covid) Anterior Nasal Swab     Status: None   Collection Time: 08/10/24  9:27 PM   Specimen: Anterior Nasal Swab  Result Value Ref Range Status   SARS Coronavirus 2 by RT PCR NEGATIVE NEGATIVE Final   Influenza A by PCR NEGATIVE NEGATIVE Final   Influenza B by PCR NEGATIVE NEGATIVE Final    Comment: (NOTE) The Xpert Xpress SARS-CoV-2/FLU/RSV plus assay is intended as an aid in the diagnosis of influenza from Nasopharyngeal swab specimens and should not be used as a sole basis for treatment. Nasal washings and aspirates are unacceptable for Xpert Xpress SARS-CoV-2/FLU/RSV testing.  Fact Sheet for Patients: bloggercourse.com  Fact Sheet for Healthcare Providers: seriousbroker.it  This test is not yet approved or cleared  by the [place]  FDA and has been authorized for detection and/or diagnosis of SARS-CoV-2 by FDA under an Emergency Use Authorization (EUA). This EUA will remain in effect (meaning this test can be used) for the duration of the COVID-19 declaration under Section 564(b)(1) of the Act, 21 U.S.C. section 360bbb-3(b)(1), unless the authorization is terminated or revoked.     Resp Syncytial Virus by PCR NEGATIVE NEGATIVE Final    Comment: (NOTE) Fact Sheet for Patients: bloggercourse.com  Fact Sheet for Healthcare Providers: seriousbroker.it  This test is not yet approved or cleared by the [place]  FDA and has been authorized for detection and/or diagnosis of SARS-CoV-2 by FDA under an Emergency Use Authorization (EUA). This EUA will remain in effect  (meaning this test can be used) for the duration of the COVID-19 declaration under Section 564(b)(1) of the Act, 21 U.S.C. section 360bbb-3(b)(1), unless the authorization is terminated or revoked.  Performed at [facility], [address], [place], [place] [address]   Group A Strep by PCR     Status: None   Collection Time: 08/10/24  9:27 PM   Specimen: Anterior Nasal Swab; Sterile Swab  Result Value Ref Range Status   Group A Strep by PCR NOT DETECTED NOT DETECTED Final    Comment: Performed at [facility], [address] [place], [place] [address]  Respiratory (~20 pathogens) panel by PCR     Status: Abnormal   Collection Time: 08/11/24  1:32 AM   Specimen: Nasopharyngeal Swab; Respiratory  Result Value Ref Range Status   Adenovirus NOT DETECTED NOT DETECTED Final   Coronavirus 229E NOT DETECTED NOT DETECTED Final    Comment: (NOTE) The Coronavirus on the Respiratory Panel, DOES NOT test for the novel  Coronavirus (2019 nCoV)    Coronavirus HKU1 NOT DETECTED NOT DETECTED Final   Coronavirus NL63 NOT DETECTED NOT DETECTED Final   Coronavirus OC43 NOT DETECTED NOT DETECTED Final   Metapneumovirus NOT DETECTED NOT DETECTED Final   Rhinovirus / Enterovirus DETECTED (A) NOT DETECTED Final   Influenza A NOT DETECTED NOT DETECTED Final   Influenza B NOT DETECTED NOT DETECTED Final   Parainfluenza Virus 1 NOT DETECTED NOT DETECTED Final   Parainfluenza Virus 2 NOT DETECTED NOT DETECTED Final   Parainfluenza Virus 3 NOT DETECTED NOT DETECTED Final   Parainfluenza Virus 4 NOT DETECTED NOT DETECTED Final   Respiratory Syncytial Virus NOT DETECTED NOT DETECTED Final   Bordetella pertussis NOT DETECTED NOT DETECTED Final   Bordetella Parapertussis NOT DETECTED NOT DETECTED Final   Chlamydophila pneumoniae NOT DETECTED NOT DETECTED Final   Mycoplasma pneumoniae NOT DETECTED NOT DETECTED Final    Comment: Performed at [facility], [address], [place], [place]  [address]     Radiology Studies: No results found.    Scheduled Meds:  atorvastatin   20 mg Oral Daily   Chlorhexidine  Gluconate Cloth  6 each Topical Daily   hydrALAZINE   50 mg Oral Q8H   insulin  aspart  0-15 Units Subcutaneous TID WC   insulin  aspart  0-5 Units Subcutaneous QHS   insulin  aspart  6 Units Subcutaneous TID WC   insulin  glargine  25 Units Subcutaneous Daily   isosorbide  mononitrate  90 mg Oral Daily   potassium chloride   40 mEq Oral BID   rivaroxaban   20 mg Oral Q supper   sodium chloride  flush  3 mL Intravenous Q12H   torsemide   40 mg Oral BID   Continuous Infusions:  amiodarone  30 mg/hr (08/16/24 0519)   DOBUTamine  2 mcg/kg/min (08/15/24 1441)     LOS: 6 days    Time spent:    Sigurd Pac, MD [facility]   08/16/2024, 10:34 AM

## 2024-08-16 NOTE — Plan of Care (Signed)

## 2024-08-16 NOTE — Plan of Care (Signed)
   Problem: Education: Goal: Ability to describe self-care measures that may prevent or decrease complications (Diabetes Survival Skills Education) will improve Outcome: Progressing   Problem: Coping: Goal: Ability to adjust to condition or change in health will improve Outcome: Progressing   Problem: Fluid Volume: Goal: Ability to maintain a balanced intake and output will improve Outcome: Progressing   Problem: Skin Integrity: Goal: Risk for impaired skin integrity will decrease Outcome: Progressing   Problem: Tissue Perfusion: Goal: Adequacy of tissue perfusion will improve Outcome: Progressing

## 2024-08-17 LAB — COOXEMETRY PANEL
Carboxyhemoglobin: 2.4 % — ABNORMAL HIGH (ref 0.5–1.5)
Methemoglobin: <0.7 % (ref 0.0–1.5)
O2 Saturation: 64.7 %
Total hemoglobin: 16.2 g/dL — ABNORMAL HIGH (ref 12.0–16.0)

## 2024-08-17 LAB — GLUCOSE, CAPILLARY
Glucose-Capillary: 172 mg/dL — ABNORMAL HIGH (ref 70–99)
Glucose-Capillary: 192 mg/dL — ABNORMAL HIGH (ref 70–99)
Glucose-Capillary: 186 mg/dL — ABNORMAL HIGH (ref 70–99)
Glucose-Capillary: 207 mg/dL — ABNORMAL HIGH (ref 70–99)

## 2024-08-17 LAB — BASIC METABOLIC PANEL WITH GFR
Anion gap: 16 — ABNORMAL HIGH (ref 5–15)
BUN: 19 mg/dL (ref 6–20)
CO2: 33 mmol/L — ABNORMAL HIGH (ref 22–32)
Calcium: 8.4 mg/dL — ABNORMAL LOW (ref 8.9–10.3)
Chloride: 89 mmol/L — ABNORMAL LOW (ref 98–111)
Creatinine, Ser: 2.07 mg/dL — ABNORMAL HIGH (ref 0.61–1.24)
GFR, Estimated: 40 mL/min — ABNORMAL LOW (ref >60)
Glucose, Bld: 214 mg/dL — ABNORMAL HIGH (ref 70–99)
Potassium: 3.4 mmol/L — ABNORMAL LOW (ref 3.5–5.1)
Sodium: 138 mmol/L (ref 135–145)

## 2024-08-17 LAB — MAGNESIUM: Magnesium: 1.6 mg/dL — ABNORMAL LOW (ref 1.7–2.4)

## 2024-08-17 MED ORDER — INSULIN ASPART 100 UNIT/ML IJ SOLN
8.0000 [IU] | Freq: Three times a day (TID) | INTRAMUSCULAR | Status: DC
  Administered 2024-08-17 – 2024-08-19 (×6): 8 [IU] via SUBCUTANEOUS
  Filled 2024-08-17 (×6): qty 8

## 2024-08-17 MED ORDER — HYDRALAZINE HCL 50 MG PO TABS
100.0000 mg | ORAL_TABLET | Freq: Three times a day (TID) | ORAL | Status: DC
  Administered 2024-08-17 – 2024-08-20 (×9): 100 mg via ORAL
  Filled 2024-08-17 (×9): qty 2

## 2024-08-17 MED ORDER — ISOSORBIDE MONONITRATE ER 60 MG PO TB24
120.0000 mg | ORAL_TABLET | Freq: Every day | ORAL | Status: DC
  Administered 2024-08-18 – 2024-08-20 (×3): 120 mg via ORAL
  Filled 2024-08-17 (×3): qty 2

## 2024-08-17 MED ORDER — MAGNESIUM SULFATE 4 GM/100ML IV SOLN
4.0000 g | Freq: Once | INTRAVENOUS | Status: AC
  Administered 2024-08-17: 4 g via INTRAVENOUS
  Filled 2024-08-17: qty 100

## 2024-08-17 MED ORDER — POTASSIUM CHLORIDE CRYS ER 20 MEQ PO TBCR
40.0000 meq | EXTENDED_RELEASE_TABLET | Freq: Once | ORAL | Status: AC
  Administered 2024-08-17: 40 meq via ORAL
  Filled 2024-08-17: qty 2

## 2024-08-17 MED ORDER — INSULIN GLARGINE 100 UNIT/ML ~~LOC~~ SOLN
35.0000 [IU] | Freq: Every day | SUBCUTANEOUS | Status: DC
  Administered 2024-08-18 – 2024-08-19 (×2): 35 [IU] via SUBCUTANEOUS
  Filled 2024-08-17 (×2): qty 0.35

## 2024-08-17 NOTE — Plan of Care (Signed)

## 2024-08-17 NOTE — Progress Notes (Signed)
 Advanced Heart Failure Rounding Note  Cardiologist: Shelda Bruckner, MD  AHF: Dr. Rolan  Chief Complaint: a/c systolic heart failure w/ low output    Patient Profile  43 y.o. Spanish-speaking male with history of chronic systolic CHF d/t NICM (prior cardiac MRI concerning for possible sarcoid but pt never completed f/u PET), uncontrolled HTN, DM II, mitral regurgitation, ETOH abuse, hx cocaine use, Afib/AFL and poor compliance, admitted w/ a/c CHF w/ marked volume overload and low output and was back in AFL w/ RVR on admission. Initial co-ox 47%. Started on DBA + amio gtts. Respiratory panel + for rhinovirus.   Subjective:   TEE/DC-CV 12/4 EF 20-25%, LV severely dilated, mod LVH, RV mod reduced, LA/RA severely dilated, mod MR. DCCV x1>NSR.   Resting comfortably, coox 64.7 on DBA at 2, BP still significantly elevated, increase hydralazine  again today and try to wean tomorrow.    Objective:   Weight Range: 120.6 kg Body mass index is 42.92 kg/m.   Vital Signs:   Temp:  [97.8 F (36.6 C)-99 F (37.2 C)] 97.8 F (36.6 C) (12/07 0731) Pulse Rate:  [82-95] 82 (12/07 0731) Resp:  [18-20] 18 (12/07 0450) BP: (144-155)/(99-111) 149/105 (12/07 0731) SpO2:  [92 %-96 %] 94 % (12/07 0731) Weight:  [120.6 kg] 120.6 kg (12/07 0450) Last BM Date : 08/17/24  Weight change: Filed Weights   08/15/24 0505 08/16/24 0300 08/17/24 0450  Weight: 122.9 kg 121.5 kg 120.6 kg    Intake/Output:   Intake/Output Summary (Last 24 hours) at 08/17/2024 1248 Last data filed at 08/17/2024 1200 Gross per 24 hour  Intake 1718.47 ml  Output 3075 ml  Net -1356.53 ml    Physical Exam   General:  Sitting up in bed. No resp difficulty HEENT: normal Neck: supple. no JVD.  Cor: Regular rate & rhythm. No rubs, gallops or murmurs. Lungs: clear Abdomen: obese soft, nontender, nondistended.Good bowel sounds. Extremities: no cyanosis, clubbing, rash, tr edema Neuro: alert & orientedx3, cranial  nerves grossly intact. moves all 4 extremities w/o difficulty. Affect pleasant   Telemetry   Sinus 90-100 Personally reviewed  Labs    CBC No results for input(s): WBC, NEUTROABS, HGB, HCT, MCV, PLT in the last 72 hours.  Basic Metabolic Panel Recent Labs    87/93/74 0500 08/17/24 0448  NA 135 138  K 3.7 3.4*  CL 93* 89*  CO2 31 33*  GLUCOSE 190* 214*  BUN 25* 19  CREATININE 2.15* 2.07*  CALCIUM  8.5* 8.4*  MG  --  1.6*   Liver Function Tests No results for input(s): AST, ALT, ALKPHOS, BILITOT, PROT, ALBUMIN in the last 72 hours.  No results for input(s): LIPASE, AMYLASE in the last 72 hours. Cardiac Enzymes No results for input(s): CKTOTAL, CKMB, CKMBINDEX, TROPONINI in the last 72 hours.  BNP: BNP (last 3 results) Recent Labs    03/09/24 1845 03/24/24 1637 08/10/24 2148  BNP 2,578.5* 1,035.3* 193.5*    ProBNP (last 3 results) No results for input(s): PROBNP in the last 8760 hours.   D-Dimer No results for input(s): DDIMER in the last 72 hours. Hemoglobin A1C No results for input(s): HGBA1C in the last 72 hours.  Fasting Lipid Panel No results for input(s): CHOL, HDL, LDLCALC, TRIG, CHOLHDL, LDLDIRECT in the last 72 hours. Thyroid  Function Tests No results for input(s): TSH, T4TOTAL, T3FREE, THYROIDAB in the last 72 hours.  Invalid input(s): FREET3  Other results:   Medications:   Scheduled Medications:  atorvastatin   20 mg Oral  Daily   Chlorhexidine  Gluconate Cloth  6 each Topical Daily   hydrALAZINE   100 mg Oral Q8H   insulin  aspart  0-15 Units Subcutaneous TID WC   insulin  aspart  0-5 Units Subcutaneous QHS   insulin  aspart  8 Units Subcutaneous TID WC   [START ON 08/18/2024] insulin  glargine  35 Units Subcutaneous Daily   isosorbide  mononitrate  90 mg Oral Daily   potassium chloride   40 mEq Oral BID   rivaroxaban   20 mg Oral Q supper   sodium chloride  flush  3 mL Intravenous  Q12H   torsemide   40 mg Oral BID    Infusions:  amiodarone  30 mg/hr (08/17/24 0700)   DOBUTamine  2 mcg/kg/min (08/17/24 0700)   magnesium  sulfate bolus IVPB 4 g (08/17/24 1245)    PRN Medications: acetaminophen  **OR** acetaminophen , guaiFENesin , mouth rinse, oxyCODONE , prochlorperazine , senna, sodium chloride  flush  Assessment/Plan   1. Acute on Chronic systolic CHF: Long history of nonischemic cardiomyopathy. Cath in 6/23 with no significant CAD, CI 1.99.  Substance abuse (cocaine and ETOH) thought to play a role in cardiomyopathy, also uncontrolled HTN in the past.  However, cardiac MRI was done in 6/23 showing LVEF 29%, LV severely dilated, asymmetric LVH with basal septum measuring 17 mm, RVEF 45%, patchy LGE in septum and RV insertion sites + subepicardial LGE in inferolateral wall. Scar pattern and asymmetric hypertrophy could be consistent with hypertrophic cardiomyopathy. However, due to subepicardial LGE in inferolateral wall and presence of mediastinal lymphadenopathy, cardiac PET recommended to rule out cardiac sarcoidosis.  This was never done due to lack of insurance.  Echo in 7/25 showed EF 20-25% with moderate concentric LVH, severe RV dysfunction, probably severe functional MR with PISA ERO 0.47 cm^2, IVC dilated.  Low output HF noted at 7/25 admission, he was started on milrinone  and diuresed, milrinone  eventually discontinued.  Now back w/ a/c CHF w/ low output, NYHA Class IIIb symptoms, in setting of recurrent AFL w/ RVR. Initial Co-ox 47%.  - Failed DBA wean on 12/5. DBA turned back up to 2 and hydral increased. Coox stable at 64 but severely elevated SVR - Control BP prior to repeat wean - Increase hydralazine  to 100mg  TID, imdur  to 120  - Volume stable, continue Torsemide  40 mg BID.  - GDMT limited by CKD stage 4. - No ? blocker w/ low output  - Not SGLT2i candidate with A1c 13.9%  - ARB if needed tomorrow - Would like to get a cardiac PET to rule out cardiac  sarcoidosis, but this is probably not an option with no insurance. - Not a candidate for advanced therapies with noncompliance, lack of insurance, RV dysfunction and CKD stage 4.   2. Atrial fibrillation/atypical flutter: Persistent atypical atrial flutter in 7/25, TEE-guided DCCV back to NSR but back in AFL w/ RVR this admission.   - continue IV amio while on DBA - Continue Xarelto . Discussed importance of picking up calls from drug company to ensure delivery. PharmD at bedside.  - TEE/DC-CV 12/4 EF 20-25%, LV severely dilated, mod LVH, RV mod reduced, LA/RA severely dilated, mod MR. DCCV x1>NSR.  - Remains in NSR  3. CKD stage IV: Suspect cardiorenal syndrome + DM2 + HTN.  Last creatinine prior to this admit was 2.4. SCr 2.0 at time of this admit, stable w/ diuresis 2.15 today. Baseline Scr 2.5-3.0 - follow BMP daily   - support CO w/ DBA    4. Mitral regurgitation: Severe functional MR.  TEE 7/25 showed severe eccentric, posteriorly-directed  MR with restricted posterior leaflet, MR ERO 0.6 cm^2 by PISA.  - continue HF/AFL optimization w/ diuresis and repeat DCCV    5. ETOH/cocaine abuse: Says he has quit. UDS negative on admission    6. DM2: Poor control.   - Insulin  per IM  - hgba1c 13.9%   7. Rhinovirus Infection:  - supportive care   8. Presumed CAP - completed 3 day course of abx per IM   9. Hypokalemia - K 3.4 - supp as needed  Length of Stay: 7  Morene JINNY Brownie, MD  08/17/2024, 12:48 PM

## 2024-08-17 NOTE — Progress Notes (Signed)
 PROGRESS NOTE    Justin [name]  FMW:[id number] DOB: 07/07/1981 DOA: 08/10/2024 PCP: Celestia Rosaline SQUIBB, NP  [phone] with chronic systolic CHF, hypertension, type 2 diabetes mellitus, EtOH use, history of cocaine abuse multiple hospitalizations with CHF exacerbation, recently hospitalized 7/25 with CHF after being off cardiac meds, developed low output and cardiorenal syndrome then required milrinone  temporarily in the hospital.  Now readmitted with volume overload, low output, atrial flutter with RVR. - Initial Co. ox was 47 with creatinine of 2, heart failure team consulted he was started on dobutamine  and Lasix  - Respiratory virus panel positive for rhinovirus - Improving with diuresis - 12/4, underwent TEE/DCCV, dobutamine  dose decreased to 1 mcg - 12/5: Co. ox dropped, dobutamine  dose increased to 2   Subjective: - Feels well, no events overnight, wants to go home  Assessment and Plan:  Acute on chronic systolic CHF, BiV failure Severe mitral regurgitation -known NICM, no significant CAD on cath 6/23 - Cardiomyopathy felt to be related to EtOH, cocaine and uncontrolled hypertension - Recent echo 7/25 noted EF 20-25%, moderate LVH, severely reduced RV, severe MR - Recent admission with low output required milrinone  - Initial Co. ox 47, heart failure team consulting, started on low-dose dobutamine  and diuretics,  -Weight down 12 LB, now on torsemide , continue Imdur  and hydralazine  -Dobutamine  down to 1 on 12/4, Co. 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 - Plan for outpatient cardiac PET scan to rule out sarcoidosis - Increase activity, attempt to wean O2  Persistent atrial flutters/fibrillation - On IV amiodarone  while on dobutamine , continue Xarelto  - sp TEE/DCCV 12/4, now in NSR  AKI on CKD 4 - Baseline creatinine around 2, - Mild uptrend, continue dobutamine ,  diuretics  Hypokalemia Replete  Severe mitral regurgitation - Felt to be functional MR, TEE 7/25 noted severe eccentric posterior directed MR  History of EtOH and cocaine use - Allegedly has quit  URI, rhinovirus - Supportive care, resolved  Type 2 diabetes mellitus - Continue glargine and meal coverage insulin , CBGs improving, dose increased further  Obesity, BMI is 45   DVT prophylaxis: Xarelto  Code Status: Full code Family Communication: None present Disposition Plan: Home likely early next week  Consultants:    Procedures:   Antimicrobials:    Objective: Vitals:   08/16/24 1950 08/17/24 0007 08/17/24 0450 08/17/24 0731  BP: (!) 150/99 (!) 155/111 (!) 154/108 (!) 149/105  Pulse: 85 95 85 82  Resp: 18 18 18    Temp: 98.8 F (37.1 C) 99 F (37.2 C) 98.9 F (37.2 C) 97.8 F (36.6 C)  TempSrc: Oral Oral Oral Oral  SpO2: 92% 95% 92% 94%  Weight:   120.6 kg   Height:        Intake/Output Summary (Last 24 hours) at 08/17/2024 1028 Last data filed at 08/17/2024 0700 Gross per 24 hour  Intake 1478.47 ml  Output 2725 ml  Net -1246.53 ml   Filed Weights   08/15/24 0505 08/16/24 0300 08/17/24 0450  Weight: 122.9 kg 121.5 kg 120.6 kg    Examination:  General exam: AAO x 3, no distress, pleasant HEENT: Neck obese unable to assess JVD Respiratory system: Clear Cardiovascular system: S1 & S2 heard, regular rhythm Abd: nondistended, soft and nontender.Normal bowel sounds heard. Central nervous system: Alert and oriented. No focal neurological deficits. Extremities: Trace edema, TED hose on Skin: No rashes Psychiatry:  Mood & affect appropriate.  Data Reviewed:   CBC: Recent Labs  Lab 08/10/24 2148 08/11/24 0631 08/12/24 0448 08/13/24 0508 08/14/24 0507  WBC 11.5* 11.1* 7.1 4.9 5.2  HGB 16.8 16.4 15.9 15.5 15.8  HCT 50.5 48.8 47.6 46.0 47.0  MCV 90.2 90.0 91.0 90.0 89.9  PLT 228 212 190 185 201   Basic Metabolic Panel: Recent Labs  Lab  08/11/24 0631 08/12/24 0448 08/13/24 0508 08/14/24 0507 08/14/24 1546 08/15/24 0510 08/16/24 0500 08/17/24 0448  NA 131* 132* 136 137  --  136 135 138  K 3.7 3.6 3.0* 2.8* 3.4* 3.6 3.7 3.4*  CL 95* 96* 95* 97*  --  93* 93* 89*  CO2 23 32 28 34*  --  30 31 33*  GLUCOSE 373* 192* 170* 139*  --  153* 190* 214*  BUN 25* 28* 30* 25*  --  27* 25* 19  CREATININE 2.09* 2.22* 2.04* 1.92*  --  2.16* 2.15* 2.07*  CALCIUM  8.1* 8.5* 8.9 8.4*  --  8.4* 8.5* 8.4*  MG 1.7 2.0 1.9  --   --   --   --  1.6*   GFR: Estimated Creatinine Clearance: 55.7 mL/min (A) (by C-G formula based on SCr of 2.07 mg/dL (H)). Liver Function Tests: Recent Labs  Lab 08/11/24 2022  AST 25  ALT 22  ALKPHOS 88  BILITOT 0.6  PROT 7.0  ALBUMIN 2.9*   No results for input(s): LIPASE, AMYLASE in the last 168 hours. No results for input(s): AMMONIA in the last 168 hours. Coagulation Profile: No results for input(s): INR, PROTIME in the last 168 hours. Cardiac Enzymes: No results for input(s): CKTOTAL, CKMB, CKMBINDEX, TROPONINI in the last 168 hours. BNP (last 3 results) No results for input(s): PROBNP in the last 8760 hours. HbA1C: No results for input(s): HGBA1C in the last 72 hours.  CBG: Recent Labs  Lab 08/16/24 0618 08/16/24 1146 08/16/24 1652 08/16/24 2049 08/17/24 0552  GLUCAP 173* 259* 162* 235* 172*   Lipid Profile: No results for input(s): CHOL, HDL, LDLCALC, TRIG, CHOLHDL, LDLDIRECT in the last 72 hours. Thyroid  Function Tests: No results for input(s): TSH, T4TOTAL, FREET4, T3FREE, THYROIDAB in the last 72 hours. Anemia Panel: No results for input(s): VITAMINB12, FOLATE, FERRITIN, TIBC, IRON, RETICCTPCT in the last 72 hours. Urine analysis:    Component Value Date/Time   COLORURINE YELLOW 03/10/2024 0042   APPEARANCEUR CLEAR 03/10/2024 0042   LABSPEC 1.011 03/10/2024 0042   PHURINE 5.0 03/10/2024 0042   GLUCOSEU NEGATIVE  03/10/2024 0042   HGBUR NEGATIVE 03/10/2024 0042   BILIRUBINUR NEGATIVE 03/10/2024 0042   KETONESUR NEGATIVE 03/10/2024 0042   PROTEINUR 100 (A) 03/10/2024 0042   NITRITE NEGATIVE 03/10/2024 0042   LEUKOCYTESUR NEGATIVE 03/10/2024 0042   Sepsis Labs: @LABRCNTIP (procalcitonin:4,lacticidven:4)  ) Recent Results (from the past 240 hours)  Resp panel by RT-PCR (RSV, Flu A&B, Covid) Anterior Nasal Swab     Status: None   Collection Time: 08/10/24  9:27 PM   Specimen: Anterior Nasal Swab  Result Value Ref Range Status   SARS Coronavirus 2 by RT PCR NEGATIVE NEGATIVE Final   Influenza A by PCR NEGATIVE NEGATIVE Final   Influenza B by PCR NEGATIVE NEGATIVE Final    Comment: (NOTE) The Xpert Xpress SARS-CoV-2/FLU/RSV plus assay is intended as an aid in the diagnosis of influenza from Nasopharyngeal swab specimens and should not be used as a sole basis for treatment. Nasal washings and aspirates are unacceptable for Xpert Xpress SARS-CoV-2/FLU/RSV testing.  Fact Sheet for Patients: bloggercourse.com  Fact Sheet for Healthcare Providers: seriousbroker.it  This test is not yet approved or cleared by the [place]  FDA and has been authorized for detection and/or diagnosis of SARS-CoV-2 by FDA under an Emergency Use Authorization (EUA). This EUA will remain in effect (meaning this test can be used) for the duration of the COVID-19 declaration under Section 564(b)(1) of the Act, 21 U.S.C. section 360bbb-3(b)(1), unless the authorization is terminated or revoked.     Resp Syncytial Virus by PCR NEGATIVE NEGATIVE Final    Comment: (NOTE) Fact Sheet for Patients: bloggercourse.com  Fact Sheet for Healthcare Providers: seriousbroker.it  This test is not yet approved or cleared by the [place]  FDA and has been authorized for detection and/or diagnosis of SARS-CoV-2 by FDA under  an Emergency Use Authorization (EUA). This EUA will remain in effect (meaning this test can be used) for the duration of the COVID-19 declaration under Section 564(b)(1) of the Act, 21 U.S.C. section 360bbb-3(b)(1), unless the authorization is terminated or revoked.  Performed at [facility], [address], [place], [place] [address]   Group A Strep by PCR     Status: None   Collection Time: 08/10/24  9:27 PM   Specimen: Anterior Nasal Swab; Sterile Swab  Result Value Ref Range Status   Group A Strep by PCR NOT DETECTED NOT DETECTED Final    Comment: Performed at [facility], [address], [place], [place] [address]  Respiratory (~20 pathogens) panel by PCR     Status: Abnormal   Collection Time: 08/11/24  1:32 AM   Specimen: Nasopharyngeal Swab; Respiratory  Result Value Ref Range Status   Adenovirus NOT DETECTED NOT DETECTED Final   Coronavirus 229E NOT DETECTED NOT DETECTED Final    Comment: (NOTE) The Coronavirus on the Respiratory Panel, DOES NOT test for the novel  Coronavirus (2019 nCoV)    Coronavirus HKU1 NOT DETECTED NOT DETECTED Final   Coronavirus NL63 NOT DETECTED NOT DETECTED Final   Coronavirus OC43 NOT DETECTED NOT DETECTED Final   Metapneumovirus NOT DETECTED NOT DETECTED Final   Rhinovirus / Enterovirus DETECTED (A) NOT DETECTED Final   Influenza A NOT DETECTED NOT DETECTED Final   Influenza B NOT DETECTED NOT DETECTED Final   Parainfluenza Virus 1 NOT DETECTED NOT DETECTED Final   Parainfluenza Virus 2 NOT DETECTED NOT DETECTED Final   Parainfluenza Virus 3 NOT DETECTED NOT DETECTED Final   Parainfluenza Virus 4 NOT DETECTED NOT DETECTED Final   Respiratory Syncytial Virus NOT DETECTED NOT DETECTED Final   Bordetella pertussis NOT DETECTED NOT DETECTED Final   Bordetella Parapertussis NOT DETECTED NOT DETECTED Final   Chlamydophila pneumoniae NOT DETECTED NOT DETECTED Final   Mycoplasma pneumoniae NOT DETECTED NOT DETECTED Final    Comment:  Performed at [facility], [address] [place], [place] [address]     Radiology Studies: No results found.    Scheduled Meds:  atorvastatin   20 mg Oral Daily   Chlorhexidine  Gluconate Cloth  6 each Topical Daily   hydrALAZINE   50 mg Oral Q8H   insulin  aspart  0-15 Units Subcutaneous TID WC   insulin  aspart  0-5 Units Subcutaneous QHS   insulin  aspart  6 Units Subcutaneous TID WC   insulin  glargine  30 Units Subcutaneous Daily   isosorbide  mononitrate  90 mg Oral Daily   potassium chloride   40 mEq Oral BID   rivaroxaban   20 mg Oral Q supper   sodium chloride  flush  3  mL Intravenous Q12H   torsemide   40 mg Oral BID   Continuous Infusions:  amiodarone  30 mg/hr (08/17/24 0700)   DOBUTamine  2 mcg/kg/min (08/17/24 0700)     LOS: 7 days    Time spent:    Sigurd Pac, MD [facility]   08/17/2024, 10:28 AM

## 2024-08-18 LAB — BASIC METABOLIC PANEL WITH GFR
Anion gap: 11 (ref 5–15)
BUN: 14 mg/dL (ref 6–20)
CO2: 28 mmol/L (ref 22–32)
Calcium: 8.3 mg/dL — ABNORMAL LOW (ref 8.9–10.3)
Chloride: 92 mmol/L — ABNORMAL LOW (ref 98–111)
Creatinine, Ser: 1.87 mg/dL — ABNORMAL HIGH (ref 0.61–1.24)
GFR, Estimated: 45 mL/min — ABNORMAL LOW (ref >60)
Glucose, Bld: 139 mg/dL — ABNORMAL HIGH (ref 70–99)
Potassium: 3.4 mmol/L — ABNORMAL LOW (ref 3.5–5.1)
Sodium: 131 mmol/L — ABNORMAL LOW (ref 135–145)

## 2024-08-18 LAB — COOXEMETRY PANEL
Carboxyhemoglobin: 1.6 % — ABNORMAL HIGH (ref 0.5–1.5)
Carboxyhemoglobin: 2.2 % — ABNORMAL HIGH (ref 0.5–1.5)
Methemoglobin: <0.7 % (ref 0.0–1.5)
Methemoglobin: <0.7 % (ref 0.0–1.5)
O2 Saturation: 72.5 %
O2 Saturation: 70.8 %
Total hemoglobin: 16.7 g/dL — ABNORMAL HIGH (ref 12.0–16.0)
Total hemoglobin: 16.8 g/dL — ABNORMAL HIGH (ref 12.0–16.0)

## 2024-08-18 LAB — MAGNESIUM
Magnesium: >9 mg/dL (ref 1.7–2.4)
Magnesium: 3 mg/dL — ABNORMAL HIGH (ref 1.7–2.4)

## 2024-08-18 LAB — GLUCOSE, CAPILLARY
Glucose-Capillary: 135 mg/dL — ABNORMAL HIGH (ref 70–99)
Glucose-Capillary: 147 mg/dL — ABNORMAL HIGH (ref 70–99)
Glucose-Capillary: 145 mg/dL — ABNORMAL HIGH (ref 70–99)
Glucose-Capillary: 248 mg/dL — ABNORMAL HIGH (ref 70–99)

## 2024-08-18 MED ORDER — POTASSIUM CHLORIDE CRYS ER 20 MEQ PO TBCR
20.0000 meq | EXTENDED_RELEASE_TABLET | Freq: Once | ORAL | Status: AC
  Administered 2024-08-18: 20 meq via ORAL
  Filled 2024-08-18: qty 1

## 2024-08-18 MED ORDER — LOSARTAN POTASSIUM 25 MG PO TABS
12.5000 mg | ORAL_TABLET | Freq: Every day | ORAL | Status: DC
  Administered 2024-08-18: 12.5 mg via ORAL
  Filled 2024-08-18: qty 1

## 2024-08-18 NOTE — Telephone Encounter (Signed)
 Received notification that patient application has been suspended due to unsuccessful attempts to contact patient to obtain information.

## 2024-08-18 NOTE — Progress Notes (Addendum)
 Advanced Heart Failure Rounding Note  Cardiologist: Shelda Bruckner, MD  AHF: Dr. Rolan  Chief Complaint: a/c systolic heart failure w/ low output  Patient Profile   43 y.o. Spanish-speaking male with history of chronic systolic CHF d/t NICM (prior cardiac MRI concerning for possible sarcoid but pt never completed f/u PET), uncontrolled HTN, DM II, mitral regurgitation, ETOH abuse, hx cocaine use, Afib/AFL and poor compliance, admitted w/ a/c CHF w/ marked volume overload and low output and was back in AFL w/ RVR on admission. Initial co-ox 47%. Started on DBA + amio gtts. Respiratory panel + for rhinovirus.   Subjective:    12/4 TEE/DCCV. EF 20-25%, LV severely dilated, mod LVH, RV mod reduced, LA/RA severely dilated, mod MR. DCCV x1>NSR.   Co-ox 73% on DBA 2. BP remains elevated, but improving. CVP 6  Lying in bed. No SOB. Feeling well, tired.   Objective:    Weight Range: 121 kg Body mass index is 43.05 kg/m.   Vital Signs:   Temp:  [98.3 F (36.8 C)-98.7 F (37.1 C)] 98.3 F (36.8 C) (12/08 0425) Pulse Rate:  [75-93] 80 (12/08 0425) Resp:  [17-18] 18 (12/08 0425) BP: (132-153)/(90-105) 153/104 (12/08 0425) SpO2:  [95 %-96 %] 96 % (12/08 0425) Weight:  [121 kg] 121 kg (12/08 0454) Last BM Date : 08/17/24  Weight change: Filed Weights   08/16/24 0300 08/17/24 0450 08/18/24 0454  Weight: 121.5 kg 120.6 kg 121 kg   Intake/Output:  Intake/Output Summary (Last 24 hours) at 08/18/2024 0741 Last data filed at 08/18/2024 0600 Gross per 24 hour  Intake 706.5 ml  Output 2100 ml  Net -1393.5 ml    Physical Exam   CVP 5-6 General: Obese appearing. No distress  Cardiac: JVP difficult to assess. No murmurs  Abdomen: Soft, non-distended.  Extremities: Warm and dry.  No peripheral edema.  Neuro: A&O x3. Affect pleasant.   Telemetry   SR 70-80s (personally reviewed)  Labs    CBC No results for input(s): WBC, NEUTROABS, HGB, HCT, MCV, PLT in  the last 72 hours.  Basic Metabolic Panel Recent Labs    87/92/74 0448 08/18/24 0426 08/18/24 0624  NA 138 131*  --   K 3.4* 3.4*  --   CL 89* 92*  --   CO2 33* 28  --   GLUCOSE 214* 139*  --   BUN 19 14  --   CREATININE 2.07* 1.87*  --   CALCIUM  8.4* 8.3*  --   MG 1.6* >9.0* 3.0*   BNP (last 3 results) Recent Labs    03/09/24 1845 03/24/24 1637 08/10/24 2148  BNP 2,578.5* 1,035.3* 193.5*   Medications:    Scheduled Medications:  atorvastatin   20 mg Oral Daily   Chlorhexidine  Gluconate Cloth  6 each Topical Daily   hydrALAZINE   100 mg Oral Q8H   insulin  aspart  0-15 Units Subcutaneous TID WC   insulin  aspart  0-5 Units Subcutaneous QHS   insulin  aspart  8 Units Subcutaneous TID WC   insulin  glargine  35 Units Subcutaneous Daily   isosorbide  mononitrate  120 mg Oral Daily   potassium chloride   40 mEq Oral BID   rivaroxaban   20 mg Oral Q supper   sodium chloride  flush  3 mL Intravenous Q12H   torsemide   40 mg Oral BID    Infusions:  amiodarone  30 mg/hr (08/18/24 0600)   DOBUTamine  2 mcg/kg/min (08/18/24 0600)    PRN Medications: acetaminophen  **OR** acetaminophen , guaiFENesin , mouth rinse, oxyCODONE ,  prochlorperazine , senna, sodium chloride  flush  Assessment/Plan   1. Acute on Chronic systolic CHF: Long history of nonischemic cardiomyopathy. Cath in 6/23 with no significant CAD, CI 1.99.  Substance abuse (cocaine and ETOH) thought to play a role in cardiomyopathy, also uncontrolled HTN in the past.  However, cardiac MRI was done in 6/23 showing LVEF 29%, LV severely dilated, asymmetric LVH with basal septum measuring 17 mm, RVEF 45%, patchy LGE in septum and RV insertion sites + subepicardial LGE in inferolateral wall. Scar pattern and asymmetric hypertrophy could be consistent with hypertrophic cardiomyopathy. However, due to subepicardial LGE in inferolateral wall and presence of mediastinal lymphadenopathy, cardiac PET recommended to rule out cardiac sarcoidosis.   This was never done due to lack of insurance.  Echo in 7/25 showed EF 20-25% with moderate concentric LVH, severe RV dysfunction, probably severe functional MR with PISA ERO 0.47 cm^2, IVC dilated.  Low output HF noted at 7/25 admission, he was started on milrinone  and diuresed, milrinone  eventually discontinued.  Now back w/ a/c CHF w/ low output, NYHA Class IIIb symptoms, in setting of recurrent AFL w/ RVR. Initial Co-ox 47%. Started on DBA. TEE 12/4 showed EF 20-25%, LV severely dilated, mod LVH, RV mod reduced, LA/RA severely dilated, mod MR.  - Failed DBA wean on 12/5. DBA turned back up to 2. Attempting better BP control.  - Co-ox 73% on DBA 2. Start slow wean, drop to 1. Wean to off this afternoon or tomorrow. - continue hydralazine  100 mg TID; increase imdur  to 120 mg daily - volume stable, continue Torsemide  40 mg BID + KCL 40 mEq - add low-dose losartan  this afternoon if SVR remains elevated. - GDMT limited by CKD stage 4. - No ? blocker w/ low output  - Not SGLT2i candidate with A1c 13.9%  - Would like to get a cardiac PET to rule out cardiac sarcoidosis, but this is probably not an option with no insurance. - Not a candidate for advanced therapies with noncompliance, lack of insurance, RV dysfunction and CKD stage 4.   2. Atrial fibrillation/atypical flutter: Persistent atypical atrial flutter in 7/25, TEE-guided DCCV back to NSR but back in AFL w/ RVR this admission. S/p TEE/DC-CV 12/4. Remains in NSR. - suspect has OSA, will be unable to perform sleep study w/o insurance - continue IV amio while on DBA - Continue Xarelto . Discussed importance of picking up calls from drug company to ensure delivery. PharmD at bedside.   3. CKD stage IV: Suspect cardiorenal syndrome + DM2 + HTN.  Last creatinine prior to this admit was 2.4. SCr 2.0 at time of this admit, stable w/ diuresis 1.87 today. Baseline Scr 2.5-3.0 - follow BMP daily   - support CO w/ DBA    4. Mitral regurgitation: Severe  functional MR.  TEE 7/25 showed severe eccentric, posteriorly-directed MR with restricted posterior leaflet, MR ERO 0.6 cm^2 by PISA.  - continue HF/AFL optimization   5. ETOH/cocaine abuse: Says he has quit. UDS negative on admission    6. DM2: Poor control.   - Insulin  per IM  - hgb a1c 13.9%   7. Rhinovirus Infection:  - supportive care   8. Presumed CAP - completed 3 day course of abx per IM   9. Hypokalemia - K 3.4 - supp as needed  Length of Stay: 8  Justin Lee, NP  08/18/2024, 7:41 AM  Patient seen with NP, I formulated the plan and agree with the above note.   I/Os net negative 1394.  Co-ox 71% on dobutamine  2.  He remains in NSR on amiodarone  gtt.  Creatinine 2.07 => 1.87.   Still with cough in setting of rhinovirus infection.   General: NAD Neck: Thick. No JVD, no thyromegaly or thyroid  nodule.  Lungs: Clear to auscultation bilaterally with normal respiratory effort. CV: Nondisplaced PMI.  Heart regular S1/S2, no S3/S4, 1/6 HSM apex.  No peripheral edema.   Abdomen: Soft, nontender, no hepatosplenomegaly, no distention.  Skin: Intact without lesions or rashes.  Neurologic: Alert and oriented x 3.  Psych: Normal affect. Extremities: No clubbing or cyanosis.  HEENT: Normal.   Volume status looks ok, agree with torsemide  40 mg po bid.    Co-ox good, try again to wean down dobutamine .  Decrease to 1 today and if stable, stop tomorrow.  Will continue hydralazine  100 tid and Imdur  120 daily and add losartan  12.5 daily.  GDMT will be limited by CKD stage 4.   He remains in NSR on amiodarone  gtt + Xarelto .  Continue amiodarone  IV while on dobutamine .   Workup and management has been limited by lack of insurance.  Unfortunately, not a candidate for advanced therapies for the above listed reasons.   [name] 08/18/2024 10:11 AM

## 2024-08-18 NOTE — Progress Notes (Signed)
 PROGRESS NOTE    Justin [name]  FMW:[id number] DOB: 12/23/1980 DOA: 08/10/2024 PCP: Celestia Rosaline SQUIBB, NP  [phone] with chronic systolic CHF, hypertension, type 2 diabetes mellitus, EtOH use, history of cocaine abuse multiple hospitalizations with CHF exacerbation, recently hospitalized 7/25 with CHF after being off cardiac meds, developed low output and cardiorenal syndrome then required milrinone  temporarily in the hospital.  Now readmitted with volume overload, low output, atrial flutter with RVR. - Initial Co. ox was 47 with creatinine of 2, heart failure team consulted he was started on dobutamine  and Lasix  - Respiratory virus panel positive for rhinovirus - Improving with diuresis - 12/4, underwent TEE/DCCV, dobutamine  dose decreased to 1 mcg - 12/5: Co. 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 ox 47, heart failure team consulting, started on low-dose dobutamine  and diuretics,  -Weight down 12 LB, now on torsemide , continue Imdur  and hydralazine  -Dobutamine  down to 1 on 12/4, Co. 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 - Plan for outpatient cardiac PET scan to rule out sarcoidosis - Increase activity, attempt to wean O2  Persistent atrial flutters/fibrillation - On IV amiodarone  while on dobutamine , continue Xarelto  - sp TEE/DCCV 12/4, now in NSR  AKI on CKD 4 - Baseline creatinine around 2, - Stable now,  continue dobutamine  wean and diuretics as above  Hypokalemia Replete  Severe mitral regurgitation - Felt to be functional MR, TEE 7/25 noted severe eccentric posterior directed MR  History of EtOH and cocaine use - Allegedly has quit  URI, rhinovirus - Supportive care, resolved  Type 2 diabetes mellitus - Continue glargine and meal coverage insulin , CBGs improving  Obesity, BMI is 45   DVT prophylaxis: Xarelto  Code Status: Full code Family Communication: None present Disposition Plan: Home likely tomorrow if stable Consultants: Heart failure team   Procedures:   Antimicrobials:    Objective: Vitals:   08/18/24 0012 08/18/24 0425 08/18/24 0454 08/18/24 0742  BP: (!) 145/105 (!) 153/104  122/79  Pulse: 88 80  86  Resp: 18 18  20   Temp: 98.5 F (36.9 C) 98.3 F (36.8 C)  98.2 F (36.8 C)  TempSrc: Oral Oral  Oral  SpO2: 95% 96%  94%  Weight:   121 kg   Height:        Intake/Output Summary (Last 24 hours) at 08/18/2024 1121 Last data filed at 08/18/2024 0856 Gross per 24 hour  Intake 706.5 ml  Output 2100 ml  Net -1393.5 ml   Filed Weights   08/16/24 0300 08/17/24 0450 08/18/24 0454  Weight: 121.5 kg 120.6 kg 121 kg    Examination:  General exam: AAO x 3, no distress, pleasant, no distress HEENT: Neck obese unable to assess JVD Respiratory system: Clear Cardiovascular system: S1 & S2 heard, regular rhythm Abd: nondistended, soft and nontender.Normal bowel sounds heard. Central nervous system: Alert and oriented. No focal neurological deficits. Extremities: Trace edema, TED hose on Skin: No rashes Psychiatry:  Mood &  affect appropriate.     Data Reviewed:   CBC: Recent Labs  Lab 08/12/24 0448 08/13/24 0508 08/14/24 0507  WBC 7.1 4.9 5.2  HGB 15.9 15.5 15.8  HCT 47.6 46.0 47.0  MCV 91.0 90.0 89.9  PLT 190 185 201   Basic Metabolic Panel: Recent Labs  Lab 08/12/24 0448 08/13/24 0508 08/14/24 0507 08/14/24 1546 08/15/24 0510  08/16/24 0500 08/17/24 0448 08/18/24 0426 08/18/24 0624  NA 132* 136 137  --  136 135 138 131*  --   K 3.6 3.0* 2.8* 3.4* 3.6 3.7 3.4* 3.4*  --   CL 96* 95* 97*  --  93* 93* 89* 92*  --   CO2 32 28 34*  --  30 31 33* 28  --   GLUCOSE 192* 170* 139*  --  153* 190* 214* 139*  --   BUN 28* 30* 25*  --  27* 25* 19 14  --   CREATININE 2.22* 2.04* 1.92*  --  2.16* 2.15* 2.07* 1.87*  --   CALCIUM  8.5* 8.9 8.4*  --  8.4* 8.5* 8.4* 8.3*  --   MG 2.0 1.9  --   --   --   --  1.6* >9.0* 3.0*   GFR: Estimated Creatinine Clearance: 61.8 mL/min (A) (by C-G formula based on SCr of 1.87 mg/dL (H)). Liver Function Tests: Recent Labs  Lab 08/11/24 2022  AST 25  ALT 22  ALKPHOS 88  BILITOT 0.6  PROT 7.0  ALBUMIN 2.9*   No results for input(s): LIPASE, AMYLASE in the last 168 hours. No results for input(s): AMMONIA in the last 168 hours. Coagulation Profile: No results for input(s): INR, PROTIME in the last 168 hours. Cardiac Enzymes: No results for input(s): CKTOTAL, CKMB, CKMBINDEX, TROPONINI in the last 168 hours. BNP (last 3 results) No results for input(s): PROBNP in the last 8760 hours. HbA1C: No results for input(s): HGBA1C in the last 72 hours.  CBG: Recent Labs  Lab 08/17/24 0552 08/17/24 1225 08/17/24 1629 08/17/24 2129 08/18/24 0607  GLUCAP 172* 192* 186* 207* 135*   Lipid Profile: No results for input(s): CHOL, HDL, LDLCALC, TRIG, CHOLHDL, LDLDIRECT in the last 72 hours. Thyroid  Function Tests: No results for input(s): TSH, T4TOTAL, FREET4, T3FREE, THYROIDAB in the last 72 hours. Anemia Panel: No results for input(s): VITAMINB12, FOLATE, FERRITIN, TIBC, IRON, RETICCTPCT in the last 72 hours. Urine analysis:    Component Value Date/Time   COLORURINE YELLOW 03/10/2024 0042   APPEARANCEUR CLEAR 03/10/2024 0042   LABSPEC 1.011 03/10/2024 0042   PHURINE 5.0 03/10/2024 0042   GLUCOSEU NEGATIVE 03/10/2024 0042    HGBUR NEGATIVE 03/10/2024 0042   BILIRUBINUR NEGATIVE 03/10/2024 0042   KETONESUR NEGATIVE 03/10/2024 0042   PROTEINUR 100 (A) 03/10/2024 0042   NITRITE NEGATIVE 03/10/2024 0042   LEUKOCYTESUR NEGATIVE 03/10/2024 0042   Sepsis Labs: @LABRCNTIP (procalcitonin:4,lacticidven:4)  ) Recent Results (from the past 240 hours)  Resp panel by RT-PCR (RSV, Flu A&B, Covid) Anterior Nasal Swab     Status: None   Collection Time: 08/10/24  9:27 PM   Specimen: Anterior Nasal Swab  Result Value Ref Range Status   SARS Coronavirus 2 by RT PCR NEGATIVE NEGATIVE Final   Influenza A by PCR NEGATIVE NEGATIVE Final   Influenza B by PCR NEGATIVE NEGATIVE Final    Comment: (NOTE) The Xpert Xpress SARS-CoV-2/FLU/RSV plus assay is intended as an aid in the diagnosis of influenza from Nasopharyngeal swab specimens and should not be used as a sole basis  for treatment. Nasal washings and aspirates are unacceptable for Xpert Xpress SARS-CoV-2/FLU/RSV testing.  Fact Sheet for Patients: bloggercourse.com  Fact Sheet for Healthcare Providers: seriousbroker.it  This test is not yet approved or cleared by the [place]  FDA and has been authorized for detection and/or diagnosis of SARS-CoV-2 by FDA under an Emergency Use Authorization (EUA). This EUA will remain in effect (meaning this test can be used) for the duration of the COVID-19 declaration under Section 564(b)(1) of the Act, 21 U.S.C. section 360bbb-3(b)(1), unless the authorization is terminated or revoked.     Resp Syncytial Virus by PCR NEGATIVE NEGATIVE Final    Comment: (NOTE) Fact Sheet for Patients: bloggercourse.com  Fact Sheet for Healthcare Providers: seriousbroker.it  This test is not yet approved or cleared by the [place]  FDA and has been authorized for detection and/or diagnosis of SARS-CoV-2 by FDA under an Emergency Use  Authorization (EUA). This EUA will remain in effect (meaning this test can be used) for the duration of the COVID-19 declaration under Section 564(b)(1) of the Act, 21 U.S.C. section 360bbb-3(b)(1), unless the authorization is terminated or revoked.  Performed at [facility], [address], [place], [place] [address]   Group A Strep by PCR     Status: None   Collection Time: 08/10/24  9:27 PM   Specimen: Anterior Nasal Swab; Sterile Swab  Result Value Ref Range Status   Group A Strep by PCR NOT DETECTED NOT DETECTED Final    Comment: Performed at [facility], [address] [place], [place] [address]  Respiratory (~20 pathogens) panel by PCR     Status: Abnormal   Collection Time: 08/11/24  1:32 AM   Specimen: Nasopharyngeal Swab; Respiratory  Result Value Ref Range Status   Adenovirus NOT DETECTED NOT DETECTED Final   Coronavirus 229E NOT DETECTED NOT DETECTED Final    Comment: (NOTE) The Coronavirus on the Respiratory Panel, DOES NOT test for the novel  Coronavirus (2019 nCoV)    Coronavirus HKU1 NOT DETECTED NOT DETECTED Final   Coronavirus NL63 NOT DETECTED NOT DETECTED Final   Coronavirus OC43 NOT DETECTED NOT DETECTED Final   Metapneumovirus NOT DETECTED NOT DETECTED Final   Rhinovirus / Enterovirus DETECTED (A) NOT DETECTED Final   Influenza A NOT DETECTED NOT DETECTED Final   Influenza B NOT DETECTED NOT DETECTED Final   Parainfluenza Virus 1 NOT DETECTED NOT DETECTED Final   Parainfluenza Virus 2 NOT DETECTED NOT DETECTED Final   Parainfluenza Virus 3 NOT DETECTED NOT DETECTED Final   Parainfluenza Virus 4 NOT DETECTED NOT DETECTED Final   Respiratory Syncytial Virus NOT DETECTED NOT DETECTED Final   Bordetella pertussis NOT DETECTED NOT DETECTED Final   Bordetella Parapertussis NOT DETECTED NOT DETECTED Final   Chlamydophila pneumoniae NOT DETECTED NOT DETECTED Final   Mycoplasma pneumoniae NOT DETECTED NOT DETECTED Final    Comment: Performed at  [facility], [address] [place], [place] [address]     Radiology Studies: No results found.    Scheduled Meds:  atorvastatin   20 mg Oral Daily   Chlorhexidine  Gluconate Cloth  6 each Topical Daily   hydrALAZINE   100 mg Oral Q8H   insulin  aspart  0-15 Units Subcutaneous TID WC   insulin  aspart  0-5 Units Subcutaneous QHS   insulin  aspart  8 Units Subcutaneous TID WC   insulin  glargine  35 Units Subcutaneous Daily   isosorbide  mononitrate  120 mg Oral Daily   losartan   12.5  mg Oral Daily   potassium chloride   40 mEq Oral BID   rivaroxaban   20 mg Oral Q supper   sodium chloride  flush  3 mL Intravenous Q12H   torsemide   40 mg Oral BID   Continuous Infusions:  amiodarone  30 mg/hr (08/18/24 0600)   DOBUTamine  1 mcg/kg/min (08/18/24 0913)     LOS: 8 days    Time spent:    Sigurd Pac, MD [facility]   08/18/2024, 11:21 AM

## 2024-08-18 NOTE — Plan of Care (Signed)

## 2024-08-19 ENCOUNTER — Other Ambulatory Visit (HOSPITAL_COMMUNITY): Payer: Self-pay

## 2024-08-19 LAB — BASIC METABOLIC PANEL WITH GFR
Anion gap: 13 (ref 5–15)
BUN: 14 mg/dL (ref 6–20)
CO2: 27 mmol/L (ref 22–32)
Calcium: 8.8 mg/dL — ABNORMAL LOW (ref 8.9–10.3)
Chloride: 97 mmol/L — ABNORMAL LOW (ref 98–111)
Creatinine, Ser: 2.2 mg/dL — ABNORMAL HIGH (ref 0.61–1.24)
GFR, Estimated: 37 mL/min — ABNORMAL LOW (ref >60)
Glucose, Bld: 109 mg/dL — ABNORMAL HIGH (ref 70–99)
Potassium: 4.1 mmol/L (ref 3.5–5.1)
Sodium: 137 mmol/L (ref 135–145)

## 2024-08-19 LAB — COOXEMETRY PANEL
Carboxyhemoglobin: 1.5 % (ref 0.5–1.5)
Carboxyhemoglobin: 1.9 % — ABNORMAL HIGH (ref 0.5–1.5)
Methemoglobin: <0.7 % (ref 0.0–1.5)
Methemoglobin: 0.7 % (ref 0.0–1.5)
O2 Saturation: 65.3 %
O2 Saturation: 59.8 %
Total hemoglobin: 17.2 g/dL — ABNORMAL HIGH (ref 12.0–16.0)
Total hemoglobin: 17.3 g/dL — ABNORMAL HIGH (ref 12.0–16.0)

## 2024-08-19 LAB — GLUCOSE, CAPILLARY
Glucose-Capillary: 241 mg/dL — ABNORMAL HIGH (ref 70–99)
Glucose-Capillary: 97 mg/dL (ref 70–99)
Glucose-Capillary: 98 mg/dL (ref 70–99)
Glucose-Capillary: 225 mg/dL — ABNORMAL HIGH (ref 70–99)
Glucose-Capillary: 231 mg/dL — ABNORMAL HIGH (ref 70–99)

## 2024-08-19 MED ORDER — INSULIN ASPART 100 UNIT/ML IJ SOLN
10.0000 [IU] | Freq: Three times a day (TID) | INTRAMUSCULAR | Status: DC
  Administered 2024-08-19 – 2024-08-20 (×4): 10 [IU] via SUBCUTANEOUS
  Filled 2024-08-19 (×3): qty 10

## 2024-08-19 MED ORDER — INSULIN STARTER KIT- PEN NEEDLES (SPANISH)
1.0000 | Freq: Once | Status: AC
  Administered 2024-08-19: 1
  Filled 2024-08-19 (×2): qty 1

## 2024-08-19 MED ORDER — AMLODIPINE BESYLATE 5 MG PO TABS
5.0000 mg | ORAL_TABLET | Freq: Every day | ORAL | Status: DC
  Administered 2024-08-19: 5 mg via ORAL
  Filled 2024-08-19 (×2): qty 1

## 2024-08-19 MED ORDER — INSULIN GLARGINE 100 UNIT/ML ~~LOC~~ SOLN
40.0000 [IU] | Freq: Every day | SUBCUTANEOUS | Status: DC
  Administered 2024-08-20: 40 [IU] via SUBCUTANEOUS
  Filled 2024-08-19: qty 0.4

## 2024-08-19 MED ORDER — AMIODARONE HCL 200 MG PO TABS
200.0000 mg | ORAL_TABLET | Freq: Two times a day (BID) | ORAL | Status: DC
  Administered 2024-08-19 – 2024-08-20 (×2): 200 mg via ORAL
  Filled 2024-08-19 (×2): qty 1

## 2024-08-19 NOTE — Plan of Care (Signed)

## 2024-08-19 NOTE — TOC CM/SW Note (Signed)
.   MATCH MEDICATION ASSISTANCE CARD Pharmacies please call [phone] for claim processing assistance.  Rx BIN: [id number] Rx Group: [id number] Rx PCN: [id number] Relationship Code: 1 Person Code: 01  Patient ID (MRN): [id number]    Patient Name: Justin [name]   Patient DOB: 02-23-1981   Discharge Date: 08/20/2024  Expiration Date: (must be filled within 7 days of discharge)     Medication Assistance/No insurance Medication Assistance through [facility]) program with $3 copay for each Rx, does not include refills. After [employer] review, pt eligible for MATCH. Pt has accepted to participate in program under terms discussed.    Delcia Crick RN CCM Case Mgmt phone [phone]

## 2024-08-19 NOTE — Progress Notes (Signed)
 PROGRESS NOTE    Justin [name]  FMW:[id number] DOB: 1981/03/29 DOA: 08/10/2024 PCP: Celestia Rosaline SQUIBB, NP  [phone] with chronic systolic CHF, hypertension, type 2 diabetes mellitus, EtOH use, history of cocaine abuse multiple hospitalizations with CHF exacerbation, recently hospitalized 7/25 with CHF after being off cardiac meds, developed low output and cardiorenal syndrome then required milrinone  temporarily in the hospital.  Now readmitted with volume overload, low output, atrial flutter with RVR. - Initial Co. ox was 47 with creatinine of 2, heart failure team consulted he was started on dobutamine  and Lasix  - Respiratory virus panel positive for rhinovirus - Improving with diuresis - 12/4, underwent TEE/DCCV, dobutamine  dose decreased to 1 mcg - 12/5: Co. 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 ox 47, heart failure team consulting, started on low-dose dobutamine  and diuretics,  -Weight down 12 LB, now on torsemide , continue Imdur  and hydralazine  -Dobutamine  down to 1 on 12/4, Co. 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 - Plan for outpatient cardiac PET scan to rule out sarcoidosis - Discharge planning, home tomorrow if stable off dobutamine   Persistent atrial flutters/fibrillation - On IV amiodarone  while on dobutamine , continue Xarelto  - sp TEE/DCCV 12/4, now in NSR  AKI on CKD 4 - Baseline creatinine around 2, - Uptrend in  creatinine today, stopping losartan , started on amlodipine   Hypokalemia Replete  Severe mitral regurgitation - Felt to be functional MR, TEE 7/25 noted severe eccentric posterior directed MR  History of EtOH and cocaine use - Allegedly has quit  URI, rhinovirus - Supportive care, resolved  Type 2 diabetes mellitus uncontrolled with hyperglycemia - Continue glargine and meal coverage insulin , dose increased - Appears that he is not on insulin  at baseline, A1c is 13.9, will consult diabetes coordinator and needs insulin  teaching  Obesity, BMI is 45   DVT prophylaxis: Xarelto  Code Status: Full code Family Communication: None present Disposition Plan: Home likely tomorrow if stable Consultants: Heart failure team   Procedures:   Antimicrobials:    Objective: Vitals:   08/19/24 0107 08/19/24 0438 08/19/24 0503 08/19/24 0734  BP: (!) 141/102 (!) 141/102  (!) 133/94  Pulse:    89  Resp: 18   18  Temp: 98.5 F (36.9 C)   98.8 F (37.1 C)  TempSrc: Oral   Oral  SpO2: 94%   95%  Weight:   121.3 kg   Height:        Intake/Output Summary (Last 24 hours) at 08/19/2024 9072 Last data filed at 08/19/2024 0825 Gross per 24 hour  Intake 840 ml  Output 1325 ml  Net -485 ml   Filed Weights   08/17/24 0450 08/18/24 0454 08/19/24 0503  Weight: 120.6 kg 121 kg 121.3 kg    Examination:  General exam: AAO x 3, no distress, pleasant, no distress HEENT: Neck obese unable to assess JVD Respiratory system: Clear Cardiovascular system: S1 & S2 heard, regular rhythm Abd: nondistended, soft and nontender.Normal bowel sounds heard. Central nervous  system: Alert and oriented. No focal neurological deficits. Extremities: Trace edema, TED hose on Skin: No rashes Psychiatry:  Mood & affect appropriate.     Data Reviewed:   CBC: Recent Labs  Lab 08/13/24 0508 08/14/24 0507  WBC 4.9 5.2  HGB 15.5 15.8  HCT 46.0 47.0  MCV 90.0 89.9  PLT 185 201   Basic Metabolic  Panel: Recent Labs  Lab 08/13/24 0508 08/14/24 0507 08/15/24 0510 08/16/24 0500 08/17/24 0448 08/18/24 0426 08/18/24 0624 08/19/24 0359  NA 136   < > 136 135 138 131*  --  137  K 3.0*   < > 3.6 3.7 3.4* 3.4*  --  4.1  CL 95*   < > 93* 93* 89* 92*  --  97*  CO2 28   < > 30 31 33* 28  --  27  GLUCOSE 170*   < > 153* 190* 214* 139*  --  109*  BUN 30*   < > 27* 25* 19 14  --  14  CREATININE 2.04*   < > 2.16* 2.15* 2.07* 1.87*  --  2.20*  CALCIUM  8.9   < > 8.4* 8.5* 8.4* 8.3*  --  8.8*  MG 1.9  --   --   --  1.6* >9.0* 3.0*  --    < > = values in this interval not displayed.   GFR: Estimated Creatinine Clearance: 52.6 mL/min (A) (by C-G formula based on SCr of 2.2 mg/dL (H)). Liver Function Tests: No results for input(s): AST, ALT, ALKPHOS, BILITOT, PROT, ALBUMIN in the last 168 hours.  No results for input(s): LIPASE, AMYLASE in the last 168 hours. No results for input(s): AMMONIA in the last 168 hours. Coagulation Profile: No results for input(s): INR, PROTIME in the last 168 hours. Cardiac Enzymes: No results for input(s): CKTOTAL, CKMB, CKMBINDEX, TROPONINI in the last 168 hours. BNP (last 3 results) No results for input(s): PROBNP in the last 8760 hours. HbA1C: No results for input(s): HGBA1C in the last 72 hours.  CBG: Recent Labs  Lab 08/18/24 0607 08/18/24 1144 08/18/24 1545 08/18/24 2106 08/19/24 0624  GLUCAP 135* 147* 145* 248* 241*   Lipid Profile: No results for input(s): CHOL, HDL, LDLCALC, TRIG, CHOLHDL, LDLDIRECT in the last 72 hours. Thyroid  Function Tests: No results for input(s): TSH, T4TOTAL, FREET4, T3FREE, THYROIDAB in the last 72 hours. Anemia Panel: No results for input(s): VITAMINB12, FOLATE, FERRITIN, TIBC, IRON, RETICCTPCT in the last 72 hours. Urine analysis:    Component Value Date/Time   COLORURINE YELLOW 03/10/2024 0042   APPEARANCEUR CLEAR 03/10/2024 0042   LABSPEC  1.011 03/10/2024 0042   PHURINE 5.0 03/10/2024 0042   GLUCOSEU NEGATIVE 03/10/2024 0042   HGBUR NEGATIVE 03/10/2024 0042   BILIRUBINUR NEGATIVE 03/10/2024 0042   KETONESUR NEGATIVE 03/10/2024 0042   PROTEINUR 100 (A) 03/10/2024 0042   NITRITE NEGATIVE 03/10/2024 0042   LEUKOCYTESUR NEGATIVE 03/10/2024 0042   Sepsis Labs: @LABRCNTIP (procalcitonin:4,lacticidven:4)  ) Recent Results (from the past 240 hours)  Resp panel by RT-PCR (RSV, Flu A&B, Covid) Anterior Nasal Swab     Status: None   Collection Time: 08/10/24  9:27 PM   Specimen: Anterior Nasal Swab  Result Value Ref Range Status   SARS Coronavirus 2 by RT PCR NEGATIVE NEGATIVE Final   Influenza A by PCR NEGATIVE NEGATIVE Final   Influenza B by PCR NEGATIVE NEGATIVE Final    Comment: (NOTE) The Xpert Xpress SARS-CoV-2/FLU/RSV plus assay is intended as an aid in the diagnosis of  influenza from Nasopharyngeal swab specimens and should not be used as a sole basis for treatment. Nasal washings and aspirates are unacceptable for Xpert Xpress SARS-CoV-2/FLU/RSV testing.  Fact Sheet for Patients: bloggercourse.com  Fact Sheet for Healthcare Providers: seriousbroker.it  This test is not yet approved or cleared by the [place]  FDA and has been authorized for detection and/or diagnosis of SARS-CoV-2 by FDA under an Emergency Use Authorization (EUA). This EUA will remain in effect (meaning this test can be used) for the duration of the COVID-19 declaration under Section 564(b)(1) of the Act, 21 U.S.C. section 360bbb-3(b)(1), unless the authorization is terminated or revoked.     Resp Syncytial Virus by PCR NEGATIVE NEGATIVE Final    Comment: (NOTE) Fact Sheet for Patients: bloggercourse.com  Fact Sheet for Healthcare Providers: seriousbroker.it  This test is not yet approved or cleared by the [place]  FDA and has  been authorized for detection and/or diagnosis of SARS-CoV-2 by FDA under an Emergency Use Authorization (EUA). This EUA will remain in effect (meaning this test can be used) for the duration of the COVID-19 declaration under Section 564(b)(1) of the Act, 21 U.S.C. section 360bbb-3(b)(1), unless the authorization is terminated or revoked.  Performed at [facility], [address] [place], [place] [address]   Group A Strep by PCR     Status: None   Collection Time: 08/10/24  9:27 PM   Specimen: Anterior Nasal Swab; Sterile Swab  Result Value Ref Range Status   Group A Strep by PCR NOT DETECTED NOT DETECTED Final    Comment: Performed at [facility], [address] [place], [place] [address]  Respiratory (~20 pathogens) panel by PCR     Status: Abnormal   Collection Time: 08/11/24  1:32 AM   Specimen: Nasopharyngeal Swab; Respiratory  Result Value Ref Range Status   Adenovirus NOT DETECTED NOT DETECTED Final   Coronavirus 229E NOT DETECTED NOT DETECTED Final    Comment: (NOTE) The Coronavirus on the Respiratory Panel, DOES NOT test for the novel  Coronavirus (2019 nCoV)    Coronavirus HKU1 NOT DETECTED NOT DETECTED Final   Coronavirus NL63 NOT DETECTED NOT DETECTED Final   Coronavirus OC43 NOT DETECTED NOT DETECTED Final   Metapneumovirus NOT DETECTED NOT DETECTED Final   Rhinovirus / Enterovirus DETECTED (A) NOT DETECTED Final   Influenza A NOT DETECTED NOT DETECTED Final   Influenza B NOT DETECTED NOT DETECTED Final   Parainfluenza Virus 1 NOT DETECTED NOT DETECTED Final   Parainfluenza Virus 2 NOT DETECTED NOT DETECTED Final   Parainfluenza Virus 3 NOT DETECTED NOT DETECTED Final   Parainfluenza Virus 4 NOT DETECTED NOT DETECTED Final   Respiratory Syncytial Virus NOT DETECTED NOT DETECTED Final   Bordetella pertussis NOT DETECTED NOT DETECTED Final   Bordetella Parapertussis NOT DETECTED NOT DETECTED Final   Chlamydophila pneumoniae NOT DETECTED NOT DETECTED  Final   Mycoplasma pneumoniae NOT DETECTED NOT DETECTED Final    Comment: Performed at [facility], [address] [place], [place] [address]     Radiology Studies: No results found.    Scheduled Meds:  amLODipine   5 mg Oral Daily   atorvastatin   20 mg Oral Daily   Chlorhexidine  Gluconate Cloth  6 each Topical Daily   hydrALAZINE   100 mg Oral Q8H   insulin  aspart  0-15 Units Subcutaneous TID WC   insulin  aspart  0-5 Units Subcutaneous QHS   insulin  aspart  8 Units Subcutaneous TID WC  insulin  glargine  35 Units Subcutaneous Daily   isosorbide  mononitrate  120 mg Oral Daily   potassium chloride   40 mEq Oral BID   rivaroxaban   20 mg Oral Q supper   sodium chloride  flush  3 mL Intravenous Q12H   torsemide   40 mg Oral BID   Continuous Infusions:  amiodarone  30 mg/hr (08/19/24 0438)     LOS: 9 days    Time spent:    Sigurd Pac, MD [facility]   08/19/2024, 9:27 AM

## 2024-08-19 NOTE — Progress Notes (Addendum)
 Advanced Heart Failure Rounding Note  Cardiologist: Shelda Bruckner, MD  AHF: Dr. Rolan  Chief Complaint: a/c systolic heart failure w/ low output  Patient Profile   43 y.o. Spanish-speaking male with history of chronic systolic CHF d/t NICM (prior cardiac MRI concerning for possible sarcoid but pt never completed f/u PET), uncontrolled HTN, DM II, mitral regurgitation, ETOH abuse, hx cocaine use, Afib/AFL and poor compliance, admitted w/ a/c CHF w/ marked volume overload and low output and was back in AFL w/ RVR on admission. Initial co-ox 47%. Started on DBA + amio gtts. Respiratory panel + for rhinovirus.   Subjective:    Co-ox 65% on DBA 1. Net even. Weight up 1lb. BP remains elevated. CVP 6 sCr 2.16>1.87>2.20  Lyin gin bed. No complaints, feeling well.   Objective:    Weight Range: 121.3 kg Body mass index is 43.16 kg/m.   Vital Signs:   Temp:  [97.6 F (36.4 C)-98.5 F (36.9 C)] 98.5 F (36.9 C) (12/09 0107) Pulse Rate:  [85-90] 85 (12/08 1942) Resp:  [18-20] 18 (12/09 0107) BP: (122-141)/(79-102) 141/102 (12/09 0438) SpO2:  [92 %-94 %] 94 % (12/09 0107) Weight:  [121.3 kg] 121.3 kg (12/09 0503) Last BM Date : 08/17/24  Weight change: Filed Weights   08/17/24 0450 08/18/24 0454 08/19/24 0503  Weight: 120.6 kg 121 kg 121.3 kg   Intake/Output:  Intake/Output Summary (Last 24 hours) at 08/19/2024 0713 Last data filed at 08/19/2024 0500 Gross per 24 hour  Intake 1077 ml  Output 1325 ml  Net -248 ml    Physical Exam   General: Well appearing. No distress  Cardiac: JVP difficult to assess cm. No murmurs  Extremities: Warm and dry.  No peripheral edema.  Neuro: A&O x3. Affect pleasant.   Telemetry   SR 80s (personally reviewed)  Labs    CBC No results for input(s): WBC, NEUTROABS, HGB, HCT, MCV, PLT in the last 72 hours.  Basic Metabolic Panel Recent Labs    87/91/74 0426 08/18/24 0624 08/19/24 0359  NA 131*  --  137  K 3.4*   --  4.1  CL 92*  --  97*  CO2 28  --  27  GLUCOSE 139*  --  109*  BUN 14  --  14  CREATININE 1.87*  --  2.20*  CALCIUM  8.3*  --  8.8*  MG >9.0* 3.0*  --    BNP (last 3 results) Recent Labs    03/09/24 1845 03/24/24 1637 08/10/24 2148  BNP 2,578.5* 1,035.3* 193.5*   Medications:    Scheduled Medications:  atorvastatin   20 mg Oral Daily   Chlorhexidine  Gluconate Cloth  6 each Topical Daily   hydrALAZINE   100 mg Oral Q8H   insulin  aspart  0-15 Units Subcutaneous TID WC   insulin  aspart  0-5 Units Subcutaneous QHS   insulin  aspart  8 Units Subcutaneous TID WC   insulin  glargine  35 Units Subcutaneous Daily   isosorbide  mononitrate  120 mg Oral Daily   losartan   12.5 mg Oral Daily   potassium chloride   40 mEq Oral BID   rivaroxaban   20 mg Oral Q supper   sodium chloride  flush  3 mL Intravenous Q12H   torsemide   40 mg Oral BID    Infusions:  amiodarone  30 mg/hr (08/19/24 0438)   DOBUTamine  1 mcg/kg/min (08/18/24 0913)    PRN Medications: acetaminophen  **OR** acetaminophen , guaiFENesin , mouth rinse, oxyCODONE , prochlorperazine , senna, sodium chloride  flush  Assessment/Plan   1. Acute on  Chronic systolic CHF: Long history of nonischemic cardiomyopathy. Cath in 6/23 with no significant CAD, CI 1.99.  Substance abuse (cocaine and ETOH) thought to play a role in cardiomyopathy, also uncontrolled HTN in the past.  However, cardiac MRI was done in 6/23 showing LVEF 29%, LV severely dilated, asymmetric LVH with basal septum measuring 17 mm, RVEF 45%, patchy LGE in septum and RV insertion sites + subepicardial LGE in inferolateral wall. Scar pattern and asymmetric hypertrophy could be consistent with hypertrophic cardiomyopathy. However, due to subepicardial LGE in inferolateral wall and presence of mediastinal lymphadenopathy, cardiac PET recommended to rule out cardiac sarcoidosis.  This was never done due to lack of insurance.  Echo in 7/25 showed EF 20-25% with moderate concentric  LVH, severe RV dysfunction, probably severe functional MR with PISA ERO 0.47 cm^2, IVC dilated.  Low output HF noted at 7/25 admission, he was started on milrinone  and diuresed, milrinone  eventually discontinued.  Now back w/ a/c CHF w/ low output, NYHA Class IIIb symptoms, in setting of recurrent AFL w/ RVR. Initial Co-ox 47%. Started on DBA. TEE 12/4 showed EF 20-25%, LV severely dilated, mod LVH, RV mod reduced, LA/RA severely dilated, mod MR.  - Failed DBA wean on 12/5. Restarted at 2. Now weaning - Co-ox 65% on DBA 1. Stop DBA today, repeat co-ox - continue hydralazine  100 mg TID + imdur  120 mg daily - stop losartan  with Cr increase; add norvasc  5 mg daily - volume stable, continue Torsemide  40 mg BID + KCL 40 mEq - GDMT limited by CKD stage 4. - No ? blocker w/ low output  - Not SGLT2i candidate with A1c 13.9%  - Would like to get a cardiac PET to rule out cardiac sarcoidosis, but this is probably not an option with no insurance. - Not a candidate for advanced therapies with noncompliance, lack of insurance, RV dysfunction and CKD stage 4.   2. Atrial fibrillation/atypical flutter: Persistent atypical atrial flutter in 7/25, TEE-guided DCCV back to NSR but back in AFL w/ RVR this admission. S/p TEE/DC-CV 12/4. Remains in NSR. - suspect has OSA, will be unable to perform sleep study w/o insurance - continue Xarelto . Application for assistance has ben suspended d/t unable to reach patient for info. Discussed with patient, he has not had any calls, will try to sort this out today with PharmD assitance. - Transition amiodarone  to po if repeat co-ox ok off DBA   3. CKD stage IV: Suspect cardiorenal syndrome + DM2 + HTN.  Last creatinine prior to this admit was 2.4. SCr 2.0 at time of this admit, stable w/ diuresis 2.16>1.87>2.2 today. Baseline Scr 2.5-3.0 - follow BMP daily   - support CO w/ DBA    4. Mitral regurgitation: Severe functional MR.  TEE 7/25 showed severe eccentric,  posteriorly-directed MR with restricted posterior leaflet, MR ERO 0.6 cm^2 by PISA.  - continue HF/AFL optimization   5. ETOH/cocaine abuse: Says he has quit. UDS negative on admission    6. DM2: Poor control.   - Insulin  per IM  - hgb a1c 13.9%   7. Rhinovirus Infection:  - supportive care   8. Presumed CAP - completed 3 day course of abx per IM   9. Hypokalemia - supp as needed  Length of Stay: 52  Justin Lee, NP  08/19/2024, 7:13 AM  Patient seen with NP, I formulated the plan and agree with the above note.   No complaints today, feels good.  However, Creatinine up a bit to  2.2.  Co-ox 65% on dobutamine  1.   General: NAD Neck: No JVD, no thyromegaly or thyroid  nodule.  Lungs: Clear to auscultation bilaterally with normal respiratory effort. CV: Lateral PMI.  Heart regular S1/S2, no S3/S4, no murmur.  No peripheral edema. Abdomen: Soft, nontender, no hepatosplenomegaly, no distention.  Skin: Intact without lesions or rashes.  Neurologic: Alert and oriented x 3.  Psych: Normal affect. Extremities: No clubbing or cyanosis.  HEENT: Normal.   Volume status looks ok, agree with torsemide  40 mg po bid.     Co-ox good, stop dobutamine  today.  Will continue hydralazine  100 tid and Imdur  120 daily. GDMT will be limited by CKD stage 4.  With creatinine up to 2.2, will add amlodipine  5 mg daily and stop losartan .    He remains in NSR on amiodarone  gtt + Xarelto .  Continue amiodarone  IV while on dobutamine , transition to po if he is able to stay off dobutamine .    Workup and management has been limited by lack of insurance.  Unfortunately, not a candidate for advanced therapies for the above listed reasons.   Justin [name] 08/19/2024 8:40 AM

## 2024-08-19 NOTE — Inpatient Diabetes Management (Signed)
 Inpatient Diabetes Program Recommendations  AACE/ADA: New Consensus Statement on Inpatient Glycemic Control (2015)  Target Ranges:  Prepandial:   less than 140 mg/dL      Peak postprandial:   less than 180 mg/dL (1-2 hours)      Critically ill patients:  140 - 180 mg/dL   Lab Results  Component Value Date   GLUCAP 97 08/19/2024   HGBA1C 13.9 (H) 08/11/2024    Inpatient Diabetes Program Recommendations:   Adina Arrow, DM coordinator taught patient use of insulin  pen on 08/13/24.  Reviewed with patient again using ipad Spanish interpretor  Bianni [phone]). Discussed hypoglycemia, use of long acting and short acting insulin .  Educated patient on insulin  pen use at home. Reviewed contents of insulin  flexpen starter kit. Reviewed all steps of insulin  pen including attachment of needle, 2-unit air shot, dialing up dose, giving injection, removing needle, disposal of sharps, storage of unused insulin , disposal of insulin  etc. Patient able to provide successful return demonstration. Also reviewed troubleshooting with insulin  pen. MD to give patient Rxs for insulin  pens and insulin  pen needles.  Discussed difference in long acting and short acting insulin  along with treatment of hypoglycemia. Patient acknowledged understanding.  Thank you, Ryot Burrous E. Zahi Plaskett, RN, MSN, CNS, CDCES  Diabetes Coordinator Inpatient Glycemic Control Team Team Pager [phone] (8am-5pm) 08/19/2024 11:57 AM

## 2024-08-19 NOTE — Plan of Care (Signed)
  Problem: Coping: Goal: Ability to adjust to condition or change in health will improve Outcome: Progressing   Problem: Health Behavior/Discharge Planning: Goal: Ability to manage health-related needs will improve Outcome: Progressing   Problem: Metabolic: Goal: Ability to maintain appropriate glucose levels will improve Outcome: Progressing   Problem: Nutritional: Goal: Maintenance of adequate nutrition will improve Outcome: Progressing   Problem: Clinical Measurements: Goal: Ability to maintain clinical measurements within normal limits will improve Outcome: Progressing

## 2024-08-19 NOTE — TOC Progression Note (Addendum)
 Transition of Care [facility]) - Progression Note    Patient Details  Name: Justin [name] MRN: [id number] Date of Birth: August 31, 1981  Transition of Care [facility]) CM/SW Contact  [name], RN Phone Number: [phone] 08/19/2024, 2:17 PM  Clinical Narrative:    Spoke to pt at bedside with Spanish Interpreter, Susanna.  Patient received detailed education on daily weight monitoring, low-sodium/heart-healthy diet, and medication adherence. He reports having a scale and pillbox provided during his last hospitalization. Patient verbalized understanding of all heart failure teaching and was able to accurately repeat back key points.   Will call [facility] on day of dc to schedule PCP hospital follow up appt. Request sent to CMA basket to schedule.  Will use HF funds/Match for medication. Notification sent to [facility] pharmacy.    Expected Discharge Plan: Home/Self Care Barriers to Discharge: Continued Medical Work up               Expected Discharge Plan and Services In-house Referral: NA Discharge Planning Services: CM Consult, Follow-up appt scheduled, Indigent Health Clinic, [facility] Program Post Acute Care Choice: NA Living arrangements for the past 2 months: Single Family Home                   DME Agency: NA       HH Arranged: NA           Social Drivers of Health (SDOH) Interventions SDOH Screenings   Food Insecurity: Food Insecurity Present (08/11/2024)  Housing: High Risk (08/11/2024)  Transportation Needs: No Transportation Needs (08/11/2024)  Utilities: Not At Risk (08/11/2024)  Alcohol Screen: Medium Risk (02/20/2022)  Depression (PHQ2-9): Low Risk  (10/12/2022)  Financial Resource Strain: Not at Risk (05/07/2024)   Received from [facility]  Physical Activity: At Risk (05/07/2024)   [facility]  Social Connections: Not at Risk (05/07/2024)   Received from [facility]  Stress: Not at Risk (05/07/2024)   Received from [facility]  Tobacco Use: Low  Risk  (08/10/2024)  Health Literacy: Inadequate Health Literacy (09/21/2023)    Readmission Risk Interventions     No data to display

## 2024-08-20 ENCOUNTER — Other Ambulatory Visit (HOSPITAL_COMMUNITY): Payer: Self-pay

## 2024-08-20 ENCOUNTER — Telehealth: Payer: Self-pay | Admitting: Primary Care

## 2024-08-20 LAB — BASIC METABOLIC PANEL WITH GFR
Anion gap: 10 (ref 5–15)
BUN: 16 mg/dL (ref 6–20)
CO2: 29 mmol/L (ref 22–32)
Calcium: 8.4 mg/dL — ABNORMAL LOW (ref 8.9–10.3)
Chloride: 96 mmol/L — ABNORMAL LOW (ref 98–111)
Creatinine, Ser: 2.59 mg/dL — ABNORMAL HIGH (ref 0.61–1.24)
GFR, Estimated: 30 mL/min — ABNORMAL LOW (ref >60)
Glucose, Bld: 124 mg/dL — ABNORMAL HIGH (ref 70–99)
Potassium: 4.1 mmol/L (ref 3.5–5.1)
Sodium: 135 mmol/L (ref 135–145)

## 2024-08-20 LAB — COOXEMETRY PANEL
Carboxyhemoglobin: 2 % — ABNORMAL HIGH (ref 0.5–1.5)
Methemoglobin: <0.7 % (ref 0.0–1.5)
O2 Saturation: 69.3 %
Total hemoglobin: 16.7 g/dL — ABNORMAL HIGH (ref 12.0–16.0)

## 2024-08-20 LAB — GLUCOSE, CAPILLARY
Glucose-Capillary: 154 mg/dL — ABNORMAL HIGH (ref 70–99)
Glucose-Capillary: 90 mg/dL (ref 70–99)

## 2024-08-20 MED ORDER — POTASSIUM CHLORIDE CRYS ER 20 MEQ PO TBCR
20.0000 meq | EXTENDED_RELEASE_TABLET | Freq: Two times a day (BID) | ORAL | 0 refills | Status: DC
  Filled 2024-08-20: qty 60, 30d supply, fill #0

## 2024-08-20 MED ORDER — INSULIN GLARGINE 100 UNIT/ML SOLOSTAR PEN
35.0000 [IU] | PEN_INJECTOR | Freq: Every day | SUBCUTANEOUS | 0 refills | Status: DC
  Filled 2024-08-20: qty 9, 25d supply, fill #0
  Filled 2024-09-10: qty 9, 25d supply, fill #1

## 2024-08-20 MED ORDER — AMLODIPINE BESYLATE 10 MG PO TABS
10.0000 mg | ORAL_TABLET | Freq: Every day | ORAL | 0 refills | Status: DC
  Filled 2024-08-20: qty 30, 30d supply, fill #0
  Filled 2024-09-23: qty 30, 30d supply, fill #1

## 2024-08-20 MED ORDER — PEN NEEDLES 32G X 4 MM MISC
35.0000 [IU] | Freq: Every day | 0 refills | Status: AC
  Filled 2024-08-20: qty 100, 30d supply, fill #0

## 2024-08-20 MED ORDER — TRUE METRIX PRO BLOOD GLUCOSE VI STRP
ORAL_STRIP | 0 refills | Status: AC
  Filled 2024-08-20: qty 100, 30d supply, fill #0

## 2024-08-20 MED ORDER — FINGERSTIX LANCETS MISC
0 refills | Status: AC
  Filled 2024-08-20: qty 100, 30d supply, fill #0

## 2024-08-20 MED ORDER — LANCET DEVICE MISC
0 refills | Status: AC
  Filled 2024-08-20: qty 1, 30d supply, fill #0

## 2024-08-20 MED ORDER — ISOSORBIDE MONONITRATE ER 120 MG PO TB24
120.0000 mg | ORAL_TABLET | Freq: Every day | ORAL | 0 refills | Status: DC
  Filled 2024-08-20: qty 30, 30d supply, fill #0
  Filled 2024-09-23: qty 30, 30d supply, fill #1

## 2024-08-20 MED ORDER — INSULIN ASPART 100 UNIT/ML FLEXPEN
5.0000 [IU] | PEN_INJECTOR | Freq: Three times a day (TID) | SUBCUTANEOUS | 0 refills | Status: DC
  Filled 2024-08-20 – 2024-09-10 (×2): qty 3, 20d supply, fill #0
  Filled 2024-09-10: qty 3, 20d supply, fill #1

## 2024-08-20 MED ORDER — AMIODARONE HCL 200 MG PO TABS
ORAL_TABLET | ORAL | 1 refills | Status: DC
  Filled 2024-08-20: qty 60, 30d supply, fill #0
  Filled 2024-09-23: qty 60, 30d supply, fill #1

## 2024-08-20 MED ORDER — TORSEMIDE 20 MG PO TABS
40.0000 mg | ORAL_TABLET | Freq: Every day | ORAL | 0 refills | Status: DC
  Filled 2024-08-20: qty 60, 30d supply, fill #0

## 2024-08-20 MED ORDER — AMLODIPINE BESYLATE 10 MG PO TABS
10.0000 mg | ORAL_TABLET | Freq: Every day | ORAL | Status: DC
  Administered 2024-08-20: 10 mg via ORAL

## 2024-08-20 NOTE — TOC Transition Note (Addendum)
 Transition of Care [facility]) - Discharge Note   Patient Details  Name: Justin [name] MRN: [id number] Date of Birth: [birth year]  Transition of Care [facility]) CM/SW Contact:  Waddell Barnie Rama, RN Phone Number: 08/20/2024, 10:44 AM   Clinical Narrative:    For dc today, Match Card was done for patient  medications and they will also use HF funds per previous NCM note and the [facility] will be calling patient to set up a follow up appt.   Final next level of care: Home/Self Care Barriers to Discharge: Continued Medical Work up   Patient Goals and CMS Choice Patient states their goals for this hospitalization and ongoing recovery are:: Plans to return home once stable.   Choice offered to / list presented to : NA      Discharge Placement                       Discharge Plan and Services Additional resources added to the After Visit Summary for   In-house Referral: NA Discharge Planning Services: CM Consult, Follow-up appt scheduled, Indigent Health Clinic, [facility] Program Post Acute Care Choice: NA            DME Agency: NA       HH Arranged: NA          Social Drivers of Health (SDOH) Interventions SDOH Screenings   Food Insecurity: Food Insecurity Present (08/11/2024)  Housing: High Risk (08/11/2024)  Transportation Needs: No Transportation Needs (08/11/2024)  Utilities: Not At Risk (08/11/2024)  Alcohol Screen: Medium Risk (02/20/2022)  Depression (PHQ2-9): Low Risk  (10/12/2022)  Financial Resource Strain: Not at Risk (05/07/2024)   Received from [facility]  Physical Activity: At Risk (05/07/2024)   Received from [facility]  Social Connections: Not at Risk (05/07/2024)   Received from [facility]  Stress: Not at Risk (05/07/2024)   Received from [facility]  Tobacco Use: Low Risk  (08/10/2024)  Health Literacy: Inadequate Health Literacy (09/21/2023)     Readmission Risk Interventions     No data to display

## 2024-08-20 NOTE — Telephone Encounter (Signed)
 RPh spoke with [employer] who requested updated patient information along with ICD code. Forms updated and faxed on 08/20/2024.

## 2024-08-20 NOTE — Discharge Summary (Signed)
 Physician Discharge Summary  Justin [name] FMW:[id number] DOB: Jan 13, 1981 DOA: 08/10/2024  PCP: Celestia Rosaline SQUIBB, NP  Admit date: 08/10/2024 Discharge date: 08/20/2024  Time spent: 45 minutes  Recommendations for Outpatient Follow-up:  Advanced heart failure clinic on 12/22 PCP Rosaline Celestia at [facility] in 1 to 2 weeks, they will call patient for follow-up  Discharge Diagnoses:    Acute on chronic systolic CHF Severe mitral regurgitation AKI on CKD 4 history of EtOH and cocaine use Rhinovirus   Essential hypertension   CKD stage 3b, GFR 30-44 ml/min (HCC)   Uncontrolled hypertension Uncontrolled type 2 diabetes mellitus with hyperglycemia   Atrial flutter with rapid ventricular response (HCC)   Hypokalemia   Elevated troponin   Discharge Condition: Improved  Diet recommendation: Heart healthy, diabetic  Filed Weights   08/18/24 0454 08/19/24 0503 08/20/24 0420  Weight: 121 kg 121.3 kg 121.8 kg    History of present illness:  43/M with chronic systolic CHF, hypertension, type 2 diabetes mellitus, EtOH use, history of cocaine abuse multiple hospitalizations with CHF exacerbation, recently hospitalized 7/25 with CHF after being off cardiac meds, developed low output and cardiorenal syndrome then required milrinone  temporarily in the hospital.  Now readmitted with volume overload, low output, atrial flutter with RVR. - Initial Co. ox was 47 with creatinine of 2, heart failure team consulted he was started on dobutamine  and Lasix  - Respiratory virus panel positive for rhinovirus - Improving with diuresis - 12/4, underwent TEE/DCCV, dobutamine  dose decreased to 1 mcg - 12/5: Co. ox dropped, dobutamine  dose increased to 2 - 12/8, dobutamine  dose decreased to 1 - 12/9: Dobutamine  discontinued  Hospital Course:   Acute on chronic systolic CHF, BiV failure Severe mitral regurgitation -known NICM, no significant CAD on cath 6/23 - Cardiomyopathy  felt to be related to EtOH, cocaine and uncontrolled hypertension - Recent echo 7/25 noted EF 20-25%, moderate LVH, severely reduced RV, severe MR - Recent admission with low output required milrinone  - Initial Co. ox 47, heart failure team consulting, started on low-dose dobutamine  and diuretics,  - Volume status improved significantly -Dobutamine  turned down to 1 on 12/4, Co. ox dropped, dobutamine  dose was increased back to 2mcg,, weaned back down and discontinued yesterday, Co. ox is now stable -GDMT limited by CKD 4, poor candidate for advanced therapies with noncompliance, lack of insurance, CKD 4 etc. - Plan for discharge home today on torsemide  40 mg daily, Imdur  and hydralazine , discussed with cards, KCl dose decreased to 20 mEq twice daily w/ CKD4 -Follow-up in advanced heart failure clinic on 12/22   Persistent atrial flutters/fibrillation - On IV amiodarone  while on dobutamine , continue Xarelto  - sp TEE/DCCV 12/4, now in NSR   AKI on CKD 4 - Baseline creatinine around 2, - Uptrend in creatinine yesterday,, stopped losartan , started on amlodipine    Hypokalemia Replete   Severe mitral regurgitation - Felt to be functional MR, TEE 7/25 noted severe eccentric posterior directed MR   History of EtOH and cocaine use - Allegedly has quit   URI, rhinovirus - Supportive care, resolved   Type 2 diabetes mellitus uncontrolled with hyperglycemia - A1c was 13.9, started on insulin  this admission, required both long-acting and short acting insulin , diabetes coordinator consult completed, insulin  teaching completed, patient reports that he lives with his aunt who takes insulin  and is comfortable taking this at discharge -Advised close follow-up with PCP Rosaline Celestia, NP   Obesity, BMI is 45  Discharge Exam: Vitals:   08/20/24 0620 08/20/24  0900  BP: (!) 142/101 (!) 140/100  Pulse:  89  Resp:  16  Temp:  98.7 F (37.1 C)  SpO2:  96%    Gen: Awake, Alert, Oriented X 3,   HEENT: no JVD Lungs: Good air movement bilaterally, CTAB CVS: S1S2/RRR Abd: soft, Non tender, non distended, BS present Extremities: No edema Skin: no new rashes on exposed skin   Discharge Instructions   Discharge Instructions     Amb Referral to Nutrition and Diabetic Education   Complete by: As directed       Allergies as of 08/20/2024   No Known Allergies      Medication List     STOP taking these medications    empagliflozin  10 MG Tabs tablet Commonly known as: JARDIANCE    metoprolol  succinate 50 MG 24 hr tablet Commonly known as: Toprol  XL   midodrine 5 MG tablet Commonly known as: PROAMATINE       TAKE these medications    amiodarone  200 MG tablet Commonly known as: PACERONE  200 mg twice a day for 2 weeks and then 200 mg daily What changed:  how much to take how to take this when to take this additional instructions   amLODipine  10 MG tablet Commonly known as: NORVASC  Take 1 tablet (10 mg total) by mouth daily. Start taking on: August 21, 2024   atorvastatin  20 MG tablet Commonly known as: LIPITOR Tome 1 tableta (20 mg en total) por va oral diariamente. (Take 1 tablet (20 mg total) by mouth daily.)   glimepiride  2 MG tablet Commonly known as: AMARYL  Take 2 mg by mouth daily.   hydrALAZINE  100 MG tablet Commonly known as: APRESOLINE  Take 1 tablet (100 mg total) by mouth with breakfast, with lunch, and with evening meal.   insulin  aspart 100 UNIT/ML FlexPen Commonly known as: NOVOLOG  Inject 5 Units into the skin 3 (three) times daily with meals.   insulin  glargine 100 UNIT/ML Solostar Pen Commonly known as: LANTUS  Inject 35 Units into the skin daily.   isosorbide  mononitrate 120 MG 24 hr tablet Commonly known as: IMDUR  Take 1 tablet (120 mg total) by mouth daily. What changed:  medication strength how much to take   potassium chloride  SA 20 MEQ tablet Commonly known as: KLOR-CON  M Take 1 tablet (20 mEq total) by mouth 2  (two) times daily. What changed: when to take this   rivaroxaban  20 MG Tabs tablet Commonly known as: XARELTO  Take 20 mg by mouth daily with supper.   Torsemide  40 MG Tabs Take 40 mg by mouth daily. What changed:  medication strength See the new instructions.       No Known Allergies  Follow-up Information     Celestia Rosaline SQUIBB, NP Follow up.   Specialty: Internal Medicine Why: THE  OFFICE OF EDWARDS,MICHELLE P , NP @ [facility] WILL CONTACT PT'S ABOUT APPT. PT'S PHONE # [phone] Contact information: [address] [place] [address] [phone]         [facility] Follow up on 09/01/2024.   Specialty: Cardiology Why: at 2:00 pm Heart Failure Clinic Jolynn Pack, Merna BROCKS Contact information: [address] [place]  [phone]                 The results of significant diagnostics from this hospitalization (including imaging, microbiology, ancillary and laboratory) are listed below for reference.    Significant Diagnostic Studies: ECHO TEE Result Date: 08/14/2024  TRANSESOPHOGEAL ECHO REPORT   Patient Name:   Justin [name] Date of Exam: 08/14/2024 Medical Rec #:  [id number]                      Height:       66.0 in Accession #:    [id number]                     Weight:       275.1 lb Date of Birth:  Mar 18, 1981                      BSA:          2.290 m Patient Age:    43 years                       BP:           156/112 mmHg Patient Gender: M                              HR:           118 bpm. Exam Location:  Inpatient Procedure: 2D Echo, Transesophageal Echo, Cardiac Doppler and Color Doppler            (Both Spectral and Color Flow Doppler were utilized during            procedure). Indications:     Arrhythmia  History:         Patient has prior history of Echocardiogram examinations, most                  recent 03/17/2024. CHF; Risk  Factors:Hypertension.  Sonographer:     Jayson Gaskins Referring Phys:  [id number] DANIEL R BENSIMHON Diagnosing Phys: Toribio Fuel MD PROCEDURE: After discussion of the risks and benefits of a TEE, an informed consent was obtained from the patient. The transesophogeal probe was passed without difficulty through the esophogus of the patient. Sedation performed by different physician. The patient was monitored while under deep sedation. Anesthestetic sedation was provided intravenously by Anesthesiology: 170mg  of Propofol . The patient developed no complications during the procedure. A successful direct current cardioversion was performed at 200 joules with 1 attempt.  IMPRESSIONS  1. Left ventricular ejection fraction, by estimation, is 20 to 25%. The left ventricle has severely decreased function. The left ventricular internal cavity size was severely dilated. There is moderate left ventricular hypertrophy.  2. Right ventricular systolic function is moderately reduced. The right ventricular size is normal.  3. Left atrial size was severely dilated. No left atrial/left atrial appendage thrombus was detected.  4. Right atrial size was severely dilated.  5. The mitral valve is abnormal. Moderate mitral valve regurgitation.  6. The aortic valve is tricuspid. Aortic valve regurgitation is not visualized.  7. Rhythm strip during this exam demonstrates atrial flutter. Conclusion(s)/Recommendation(s): No LA/LAA thrombus identified. Successful cardioversion performed with restoration of normal sinus rhythm. FINDINGS  Left Ventricle: Left ventricular ejection fraction, by estimation, is 20 to 25%. The left ventricle has severely decreased function. The left ventricular internal cavity size was severely dilated. There is moderate left ventricular hypertrophy. Right Ventricle: The right ventricular size is normal. No increase in right ventricular wall thickness. Right ventricular systolic function is moderately reduced. Left  Atrium: Left atrial size was severely dilated. No left atrial/left atrial appendage thrombus was detected. Right  Atrium: Right atrial size was severely dilated. Pericardium: There is no evidence of pericardial effusion. Mitral Valve: The mitral valve is abnormal. Moderate mitral valve regurgitation, with posteriorly-directed jet. Tricuspid Valve: The tricuspid valve is grossly normal. Tricuspid valve regurgitation is mild. Aortic Valve: The aortic valve is tricuspid. Aortic valve regurgitation is not visualized. Pulmonic Valve: The pulmonic valve was grossly normal. Pulmonic valve regurgitation is trivial. Aorta: The aortic root and ascending aorta are structurally normal, with no evidence of dilitation. IAS/Shunts: No atrial level shunt detected by color flow Doppler. There is no evidence of a patent foramen ovale. There is no evidence of an atrial septal defect. EKG: Rhythm strip during this exam demonstrates atrial flutter. Toribio Fuel MD Electronically signed by Toribio Fuel MD Signature Date/Time: 08/14/2024/5:08:58 PM    Final    EP STUDY Result Date: 08/14/2024 See surgical note for result.  US  EKG SITE RITE Result Date: 08/11/2024 If Site Rite image not attached, placement could not be confirmed due to current cardiac rhythm.  DG Chest 2 View Result Date: 08/10/2024 CLINICAL DATA:  Difficulty breathing EXAM: CHEST - 2 VIEW COMPARISON:  Chest x-ray 03/09/2024.  Chest CT 02/17/2022. FINDINGS: The heart is enlarged. There central pulmonary vascular congestion. There are patchy airspace opacities in the infrahilar regions bilaterally. Costophrenic angles are clear. No pleural effusion or pneumothorax. No acute fractures are seen. IMPRESSION: 1. Cardiomegaly with central pulmonary vascular congestion. 2. Patchy airspace opacities in the infrahilar regions bilaterally may represent edema or infection. Electronically Signed   By: Greig Pique M.D.   On: 08/10/2024 22:20     Microbiology: Recent Results (from the past 240 hours)  Resp panel by RT-PCR (RSV, Flu A&B, Covid) Anterior Nasal Swab     Status: None   Collection Time: 08/10/24  9:27 PM   Specimen: Anterior Nasal Swab  Result Value Ref Range Status   SARS Coronavirus 2 by RT PCR NEGATIVE NEGATIVE Final   Influenza A by PCR NEGATIVE NEGATIVE Final   Influenza B by PCR NEGATIVE NEGATIVE Final    Comment: (NOTE) The Xpert Xpress SARS-CoV-2/FLU/RSV plus assay is intended as an aid in the diagnosis of influenza from Nasopharyngeal swab specimens and should not be used as a sole basis for treatment. Nasal washings and aspirates are unacceptable for Xpert Xpress SARS-CoV-2/FLU/RSV testing.  Fact Sheet for Patients: bloggercourse.com  Fact Sheet for Healthcare Providers: seriousbroker.it  This test is not yet approved or cleared by the [place]  FDA and has been authorized for detection and/or diagnosis of SARS-CoV-2 by FDA under an Emergency Use Authorization (EUA). This EUA will remain in effect (meaning this test can be used) for the duration of the COVID-19 declaration under Section 564(b)(1) of the Act, 21 U.S.C. section 360bbb-3(b)(1), unless the authorization is terminated or revoked.     Resp Syncytial Virus by PCR NEGATIVE NEGATIVE Final    Comment: (NOTE) Fact Sheet for Patients: bloggercourse.com  Fact Sheet for Healthcare Providers: seriousbroker.it  This test is not yet approved or cleared by the [place]  FDA and has been authorized for detection and/or diagnosis of SARS-CoV-2 by FDA under an Emergency Use Authorization (EUA). This EUA will remain in effect (meaning this test can be used) for the duration of the COVID-19 declaration under Section 564(b)(1) of the Act, 21 U.S.C. section 360bbb-3(b)(1), unless the authorization is terminated or revoked.  Performed at  [facility], [address] [place], [place] [address]   Group A Strep by PCR  Status: None   Collection Time: 08/10/24  9:27 PM   Specimen: Anterior Nasal Swab; Sterile Swab  Result Value Ref Range Status   Group A Strep by PCR NOT DETECTED NOT DETECTED Final    Comment: Performed at [facility], [address], [place], [place] [address]  Respiratory (~20 pathogens) panel by PCR     Status: Abnormal   Collection Time: 08/11/24  1:32 AM   Specimen: Nasopharyngeal Swab; Respiratory  Result Value Ref Range Status   Adenovirus NOT DETECTED NOT DETECTED Final   Coronavirus 229E NOT DETECTED NOT DETECTED Final    Comment: (NOTE) The Coronavirus on the Respiratory Panel, DOES NOT test for the novel  Coronavirus (2019 nCoV)    Coronavirus HKU1 NOT DETECTED NOT DETECTED Final   Coronavirus NL63 NOT DETECTED NOT DETECTED Final   Coronavirus OC43 NOT DETECTED NOT DETECTED Final   Metapneumovirus NOT DETECTED NOT DETECTED Final   Rhinovirus / Enterovirus DETECTED (A) NOT DETECTED Final   Influenza A NOT DETECTED NOT DETECTED Final   Influenza B NOT DETECTED NOT DETECTED Final   Parainfluenza Virus 1 NOT DETECTED NOT DETECTED Final   Parainfluenza Virus 2 NOT DETECTED NOT DETECTED Final   Parainfluenza Virus 3 NOT DETECTED NOT DETECTED Final   Parainfluenza Virus 4 NOT DETECTED NOT DETECTED Final   Respiratory Syncytial Virus NOT DETECTED NOT DETECTED Final   Bordetella pertussis NOT DETECTED NOT DETECTED Final   Bordetella Parapertussis NOT DETECTED NOT DETECTED Final   Chlamydophila pneumoniae NOT DETECTED NOT DETECTED Final   Mycoplasma pneumoniae NOT DETECTED NOT DETECTED Final    Comment: Performed at [facility], [address] [place], [place] [address]     Labs: Basic Metabolic Panel: Recent Labs  Lab 08/16/24 0500 08/17/24 0448 08/18/24 0426 08/18/24 0624 08/19/24 0359 08/20/24 0429  NA 135 138 131*  --  137 135  K 3.7 3.4* 3.4*  --  4.1 4.1   CL 93* 89* 92*  --  97* 96*  CO2 31 33* 28  --  27 29  GLUCOSE 190* 214* 139*  --  109* 124*  BUN 25* 19 14  --  14 16  CREATININE 2.15* 2.07* 1.87*  --  2.20* 2.59*  CALCIUM  8.5* 8.4* 8.3*  --  8.8* 8.4*  MG  --  1.6* >9.0* 3.0*  --   --    Liver Function Tests: No results for input(s): AST, ALT, ALKPHOS, BILITOT, PROT, ALBUMIN in the last 168 hours. No results for input(s): LIPASE, AMYLASE in the last 168 hours. No results for input(s): AMMONIA in the last 168 hours. CBC: Recent Labs  Lab 08/14/24 0507  WBC 5.2  HGB 15.8  HCT 47.0  MCV 89.9  PLT 201   Cardiac Enzymes: No results for input(s): CKTOTAL, CKMB, CKMBINDEX, TROPONINI in the last 168 hours. BNP: BNP (last 3 results) Recent Labs    03/09/24 1845 03/24/24 1637 08/10/24 2148  BNP 2,578.5* 1,035.3* 193.5*    ProBNP (last 3 results) No results for input(s): PROBNP in the last 8760 hours.  CBG: Recent Labs  Lab 08/19/24 1056 08/19/24 1605 08/19/24 2031 08/19/24 2113 08/20/24 0605  GLUCAP 97 98 225* 231* 154*       Signed:  Sigurd Pac MD.  [facility] 08/20/2024, 9:55 AM

## 2024-08-20 NOTE — Telephone Encounter (Signed)
 Copied from CRM [id number]. Topic: Appointments - Scheduling Inquiry for Clinic >> Aug 19, 2024  4:29 PM Hadassah PARAS wrote:  Reason for CRM: Princella from [facility] called in to set up Hospital FU app, date of discharge 12/10. No avail until Jan 9th. Please advise pt on [phone]

## 2024-08-20 NOTE — Progress Notes (Signed)
 Advanced Heart Failure Rounding Note  Cardiologist: Shelda Bruckner, MD  AHF: Dr. Rolan  Chief Complaint: a/c systolic heart failure w/ low output  Patient Profile   43 y.o. Spanish-speaking male with history of chronic systolic CHF d/t NICM (prior cardiac MRI concerning for possible sarcoid but pt never completed f/u PET), uncontrolled HTN, DM II, mitral regurgitation, ETOH abuse, hx cocaine use, Afib/AFL and poor compliance, admitted w/ a/c CHF w/ marked volume overload and low output and was back in AFL w/ RVR on admission. Initial co-ox 47%. Started on DBA + amio gtts. Respiratory panel + for rhinovirus.   Subjective:    Co-ox 69% off DBA. Net even. Weight up another 1lb today.  BP remains elevated. CVP 5-6 sCr 2.16>1.87>2.20>2.59  Feeling well this morning. No SOB, CP. Abdomen is soft.   Objective:    Weight Range: 121.8 kg Body mass index is 43.34 kg/m.   Vital Signs:   Temp:  [97.7 F (36.5 C)-98.9 F (37.2 C)] 98.2 F (36.8 C) (12/10 0420) Pulse Rate:  [85-94] 88 (12/10 0420) Resp:  [15-20] 17 (12/10 0420) BP: (128-142)/(76-101) 142/101 (12/10 0620) SpO2:  [89 %-96 %] 96 % (12/10 0420) Weight:  [121.8 kg] 121.8 kg (12/10 0420) Last BM Date : 08/19/24  Weight change: Filed Weights   08/18/24 0454 08/19/24 0503 08/20/24 0420  Weight: 121 kg 121.3 kg 121.8 kg   Intake/Output:  Intake/Output Summary (Last 24 hours) at 08/20/2024 0812 Last data filed at 08/20/2024 0230 Gross per 24 hour  Intake 480 ml  Output 1150 ml  Net -670 ml    Physical Exam   General: Well appearing. No distress  Cardiac: JVP difficult to assess. No murmurs  Abdomen: Soft, non-distended.  Extremities: Warm and dry.  No peripheral edema.  Neuro: A&O x3. Affect pleasant.   Telemetry   SR 80s (personally reviewed)  Labs    CBC No results for input(s): WBC, NEUTROABS, HGB, HCT, MCV, PLT in the last 72 hours.  Basic Metabolic Panel Recent Labs     08/18/24 0426 08/18/24 0624 08/19/24 0359 08/20/24 0429  NA 131*  --  137 135  K 3.4*  --  4.1 4.1  CL 92*  --  97* 96*  CO2 28  --  27 29  GLUCOSE 139*  --  109* 124*  BUN 14  --  14 16  CREATININE 1.87*  --  2.20* 2.59*  CALCIUM  8.3*  --  8.8* 8.4*  MG >9.0* 3.0*  --   --    BNP (last 3 results) Recent Labs    03/09/24 1845 03/24/24 1637 08/10/24 2148  BNP 2,578.5* 1,035.3* 193.5*   Medications:    Scheduled Medications:  amiodarone   200 mg Oral BID   amLODipine   5 mg Oral Daily   atorvastatin   20 mg Oral Daily   Chlorhexidine  Gluconate Cloth  6 each Topical Daily   hydrALAZINE   100 mg Oral Q8H   insulin  aspart  0-15 Units Subcutaneous TID WC   insulin  aspart  0-5 Units Subcutaneous QHS   insulin  aspart  10 Units Subcutaneous TID WC   insulin  glargine  40 Units Subcutaneous Daily   isosorbide  mononitrate  120 mg Oral Daily   potassium chloride   40 mEq Oral BID   rivaroxaban   20 mg Oral Q supper   sodium chloride  flush  3 mL Intravenous Q12H   torsemide   40 mg Oral BID    Infusions:    PRN Medications: acetaminophen  **OR** acetaminophen ,  guaiFENesin , mouth rinse, oxyCODONE , prochlorperazine , senna, sodium chloride  flush  Assessment/Plan   1. Acute on Chronic systolic CHF: Long history of nonischemic cardiomyopathy. Cath in 6/23 with no significant CAD, CI 1.99.  Substance abuse (cocaine and ETOH) thought to play a role in cardiomyopathy, also uncontrolled HTN in the past.  However, cardiac MRI was done in 6/23 showing LVEF 29%, LV severely dilated, asymmetric LVH with basal septum measuring 17 mm, RVEF 45%, patchy LGE in septum and RV insertion sites + subepicardial LGE in inferolateral wall. Scar pattern and asymmetric hypertrophy could be consistent with hypertrophic cardiomyopathy. However, due to subepicardial LGE in inferolateral wall and presence of mediastinal lymphadenopathy, cardiac PET recommended to rule out cardiac sarcoidosis.  This was never done due  to lack of insurance.  Echo in 7/25 showed EF 20-25% with moderate concentric LVH, severe RV dysfunction, probably severe functional MR with PISA ERO 0.47 cm^2, IVC dilated.  Low output HF noted at 7/25 admission, he was started on milrinone  and diuresed, milrinone  eventually discontinued.  Now back w/ a/c CHF w/ low output, NYHA Class IIIb symptoms, in setting of recurrent AFL w/ RVR. Initial Co-ox 47%. Started on DBA. TEE 12/4 showed EF 20-25%, LV severely dilated, mod LVH, RV mod reduced, LA/RA severely dilated, mod MR. Initially failed DBA wean and it was restarted. - Co-ox 69%. Stable off DBA.  - continue hydralazine  100 mg TID + imdur  120 mg daily - increase norvasc  to 10 mg daily - continue Torsemide  40 mg BID + KCL 40 mEq BID - GDMT limited by CKD stage 4. - No ? blocker w/ low output  - Not SGLT2i candidate with A1c 13.9%  - Would like to get a cardiac PET to rule out cardiac sarcoidosis, but this is probably not an option with no insurance. - Not a candidate for advanced therapies with noncompliance, lack of insurance, RV dysfunction and CKD stage 4.   2. Atrial fibrillation/atypical flutter: Persistent atypical atrial flutter in 7/25, TEE-guided DCCV back to NSR but back in AFL w/ RVR this admission. S/p TEE/DC-CV 12/4. Remains in NSR. - suspect has OSA, will be unable to perform sleep study w/o insurance - continue Xarelto . Application for assistance has been resubmitted - continue amio 200 mg bid for 2 weeks, then amio 200 daily  3. CKD stage IV: Suspect cardiorenal syndrome + DM2 + HTN.  Last creatinine prior to this admit was 2.4. SCr 2.0 at time of this admit, stable w/ diuresis 2.16>1.87>2.2 today. Baseline Scr 2.5-3.0 - follow BMP daily   - support CO w/ DBA    4. Mitral regurgitation: Severe functional MR.  TEE 7/25 showed severe eccentric, posteriorly-directed MR with restricted posterior leaflet, MR ERO 0.6 cm^2 by PISA.  - continue HF/AFL optimization   5. ETOH/cocaine  abuse: Says he has quit. UDS negative on admission    6. DM2: Poor control.   - Insulin  per IM  - hgb a1c 13.9%   7. Rhinovirus Infection:  - supportive care   8. Presumed CAP - completed 3 day course of abx per IM   9. Hypokalemia - supp as needed  Heart failure team will sign off as of 08/20/24.  HF Team Medication Recommendations for Home: - amiodarone  200 mg bid for 2 weeks, then 200 mg daily - xarelto  20 mg daily - norvasc  10 mg daily - atorvastatin  20 mg daily - hydralazine  100 mg tid - imdur  120 mg daily - potassium 40 mg bid - torsemide  40 mg daily  Has follow up scheduled in HF Clinic 09/01/24 at 2 pm.  Length of Stay: 10  Krystall Kruckenberg, NP  08/20/2024, 8:12 AM

## 2024-08-21 ENCOUNTER — Other Ambulatory Visit: Payer: Self-pay | Admitting: Pharmacist

## 2024-08-21 ENCOUNTER — Other Ambulatory Visit: Payer: Self-pay

## 2024-08-21 ENCOUNTER — Telehealth: Payer: Self-pay | Admitting: *Deleted

## 2024-08-21 DIAGNOSIS — Z59819 Housing instability, housed unspecified: Secondary | ICD-10-CM

## 2024-08-21 MED ORDER — ATORVASTATIN CALCIUM 20 MG PO TABS
20.0000 mg | ORAL_TABLET | Freq: Every day | ORAL | 2 refills | Status: DC
  Filled 2024-08-21: qty 30, 30d supply, fill #0
  Filled 2024-09-23: qty 30, 30d supply, fill #1

## 2024-08-21 MED ORDER — HYDRALAZINE HCL 100 MG PO TABS
100.0000 mg | ORAL_TABLET | Freq: Three times a day (TID) | ORAL | 2 refills | Status: DC
  Filled 2024-08-21: qty 90, 30d supply, fill #0
  Filled 2024-09-23: qty 90, 30d supply, fill #1

## 2024-08-21 MED ORDER — GLIMEPIRIDE 2 MG PO TABS
2.0000 mg | ORAL_TABLET | Freq: Every day | ORAL | 2 refills | Status: DC
  Filled 2024-08-21: qty 30, 30d supply, fill #0
  Filled 2024-09-23: qty 30, 30d supply, fill #1

## 2024-08-21 NOTE — Transitions of Care (Post Inpatient/ED Visit) (Signed)
 08/21/2024  Name: Justin [name] MRN: [id number] DOB: 09/17/1980  Today's TOC FU Call Status: Today's TOC FU Call Status:: Successful TOC FU Call Completed TOC FU Call Complete Date: 08/21/24  Patient's Name and Date of Birth confirmed. Name, DOB  Transition Care Management Follow-up Telephone Call Date of Discharge: 08/20/24 Discharge Facility: [facility]) Type of Discharge: Inpatient Admission Primary Inpatient Discharge Diagnosis:: Community acquired pneumonia How have you been since you were released from the hospital?: Better Any questions or concerns?: No  Items Reviewed: Did you receive and understand the discharge instructions provided?: Yes (Patient received discharge instructions, but has not reviewed. Patient denies any questions. RNCM advised reviewing information) Medications obtained,verified, and reconciled?: Yes (Medications Reviewed) Any new allergies since your discharge?: No Dietary orders reviewed?: Yes Type of Diet Ordered:: Heart healthy, diabetic Do you have support at home?: Yes People in Home [RPT]: other relative(s) Name of Support/Comfort Primary Source: Aunt and Sister/Esmeralda  Medications Reviewed Today: Medications Reviewed Today     Reviewed by Lucky Andrea LABOR, RN (Registered Nurse) on 08/21/24 at 1047  Med List Status: <None>   Medication Order Taking? Sig Documenting Provider Last Dose Status Informant  amiodarone  (PACERONE ) 200 MG tablet [id number] Yes Take 1 tablet (200mg ) by mouth twice a day for 2 weeks, and then 1 tablet (200mg ) daily. Fairy Frames, MD  Active   amLODipine  (NORVASC ) 10 MG tablet [id number] Yes Take 1 tablet (10 mg total) by mouth daily. Fairy Frames, MD  Active   atorvastatin  (LIPITOR) 20 MG tablet [id number]  Take 1 tablet (20 mg total) by mouth daily.  Patient not taking: Reported on 08/21/2024   Lee, Jordan, NP  Active   Fingerstix Lancets MISC [id number]  Use as directed up to four times  daily. Fairy Frames, MD  Active   glimepiride  (AMARYL ) 2 MG tablet [id number]  Take 2 mg by mouth daily.  Patient not taking: Reported on 08/21/2024   [provider]  Active            Med Note (COFFELL, JON HERO   Tue Aug 12, 2024 10:10 AM)    glucose blood (TRUE METRIX PRO BLOOD GLUCOSE) test strip [id number]  Use up to four times daily as directed. (FOR ICD-10 E10.9, E11.9). Fairy Frames, MD  Active   hydrALAZINE  (APRESOLINE ) 100 MG tablet [id number]  Take 1 tablet (100 mg total) by mouth with breakfast, with lunch, and with evening meal.  Patient not taking: Reported on 08/21/2024     Active   insulin  aspart (NOVOLOG ) 100 UNIT/ML FlexPen [id number] Yes Inject 5 Units into the skin 3 (three) times daily with meals. Fairy Frames, MD  Active   insulin  glargine (LANTUS ) 100 UNIT/ML Solostar Pen [id number] Yes Inject 35 Units into the skin daily. Fairy Frames, MD  Active   Insulin  Pen Needle (PEN NEEDLES) 32G X 4 MM MISC [id number] Yes Use on Insulin  pens Fairy Frames, MD  Active   isosorbide  mononitrate (IMDUR ) 120 MG 24 hr tablet [id number] Yes Take 1 tablet (120 mg total) by mouth daily. Fairy Frames, MD  Active   Lancet Device MISC [id number] Yes Use as directed up to 4 times daily. Fairy Frames, MD  Active   potassium chloride  SA (KLOR-CON  M) 20 MEQ tablet [id number] Yes Take 1 tablet (20 mEq total) by mouth 2 (two) times daily. Fairy Frames, MD  Active   rivaroxaban  (XARELTO ) 20 MG TABS tablet [id number]  Take 20 mg by mouth daily with supper.  Patient  not taking: Reported on 08/21/2024   [provider]  Active   torsemide  (DEMADEX ) 20 MG tablet [id number] Yes Take 2 tablets (40 mg total) by mouth daily. Fairy Frames, MD  Active             Home Care and Equipment/Supplies: Were Home Health Services Ordered?: No Any new equipment or medical supplies ordered?: No  Functional Questionnaire: Do you need assistance with bathing/showering or  dressing?: No Do you need assistance with meal preparation?: No Do you need assistance with eating?: No Do you have difficulty maintaining continence: No Do you need assistance with getting out of bed/getting out of a chair/moving?: No Do you have difficulty managing or taking your medications?: No  Follow up appointments reviewed: PCP Follow-up appointment confirmed?: No (PCP office has been contacted and working to schedule an appointment) MD Provider Line [phone] Given: No Specialist Hospital Follow-up appointment confirmed?: Yes Date of Specialist follow-up appointment?: 09/01/24 Follow-Up Specialty Provider:: [facility] Do you need transportation to your follow-up appointment?: No Do you understand care options if your condition(s) worsen?: Yes-patient verbalized understanding  SDOH Interventions Today    Flowsheet Row Most Recent Value  SDOH Interventions   Food Insecurity Interventions Intervention Not Indicated  Housing Interventions Other (Comment)  [BSW referral]  Transportation Interventions Intervention Not Indicated  Utilities Interventions Intervention Not Indicated    Goals Addressed             This Visit's Progress    VBCI Transitions of Care (TOC) Care Plan       Problems:  Recent Hospitalization for treatment of CHF and CAP Knowledge Deficit Related to CHF management and Medication access barrier due to no insurance and needing refills for prescribed medications  Goal:  Over the next 30 days, the patient will not experience hospital readmission  Interventions:  Transitions of Care: Durable Medical Equipment (DME) reviewed with patient/caregiver and advised patient to purchase a BP monitor from a local pharmacy Doctor Visits  - discussed the importance of doctor visits Contacted provider for patient needs secure message to Pharmacist [facility] regarding patient needing refills for Atorvastatin , Hydralazine  and Glimepiride (noted patient also  needs Entresto -per documentation this need is being addressed by the pharmacy team) Post discharge activity limitations prescribed by provider reviewed Reviewed diet, exercise, daily weights Medication review, patient started on insulin  during this admission, denies concerns Reviewed weight Reviewed blood sugar Utilized Spanish interpreter [phone] via [employer] BSW referral for housing resources-scheduled on 08/25/24 with [facility]  Patient Self Care Activities:  Attend all scheduled provider appointments Call pharmacy for medication refills 3-7 days in advance of running out of medications Call provider office for new concerns or questions  Notify RN Care Manager of TOC call rescheduling needs Participate in Transition of Care Program/Attend TOC scheduled calls Take medications as prescribed   Work with the social worker to address care coordination needs and will continue to work with the clinical team to address health care and disease management related needs call office if I gain more than 2 pounds in one day or 5 pounds in one week use salt in moderation watch for swelling in feet, ankles and legs every day eat more whole grains, fruits and vegetables, lean meats and healthy fats  Plan:  Telephone follow up appointment with care management team member scheduled for:  08/29/24 at 10am       Discussed and offered 30 day TOC program.  Patient      enrolled.  The patient  has been provided with contact information for the care management team and has been advised to call with any health -related questions or concerns.  The patient verbalized understanding with current plan of care.  The patient is directed to their insurance card regarding availability of benefits coverage.    Andrea Dimes RN, BSN [place]  Value-Based Care Institute [facility] RN Care Manager [phone]

## 2024-08-25 ENCOUNTER — Other Ambulatory Visit: Payer: Self-pay

## 2024-08-25 NOTE — Patient Outreach (Signed)
 Social Drivers of Health  Community Resource and Care Coordination Visit Note   08/25/2024  Name: Yotam Rhine MRN: [id number] DOB:[birth year]  Situation: Referral received for [facility] needs assessment and assistance related to Housing  Financial Strain . I obtained verbal consent from Patient.  Visit completed with Patient on the phone.   Background:    BSW outreached patient to address housing concern. During the call patient stated that he is currently living with his aunt and has been unable to come up with rent as he has not been working consistently. Patient stated that he is able to live there for the time being but there is no risk of him being evicted from the home. BSW will send housing resources at this time.  Assessment:   Goals Addressed             This Visit's Progress    BSW Goals       Current SDOH Barriers:  Housing barriers  Interventions: Patient interviewed and appropriate screenings performed Referred patient to community resources           Recommendation:   attend all scheduled provider appointments call for transportation assistance at least one week before appointments  Follow Up Plan:   Patient has achieved all patient stated goals. [employer] will be closed. Patient has been provided contact information should new needs arise.   Orlean Fey, BSW [place]  Value Based Care Institute Social Worker, [employer] [facility] [phone]

## 2024-08-25 NOTE — Patient Instructions (Signed)
 Visit Information  Thank you for taking time to visit with me today. Please don't hesitate to contact me if I can be of assistance to you before our next scheduled appointment.  Our next appointment is no further scheduled appointments.   Please call the care guide team at [phone] if you need to cancel or reschedule your appointment.   Following is a copy of your care plan:   Goals Addressed             This Visit's Progress    BSW Goals       Current SDOH Barriers:  Housing barriers  Interventions: Patient interviewed and appropriate screenings performed Referred patient to community resources           Please call the Suicide and Crisis Lifeline: 988 call the [place]  National Suicide Prevention Lifeline: (218)503-1537 or TTY: 2623174415 TTY 705-343-2838) to talk to a trained counselor call 1-800-273-TALK (toll free, 24 hour hotline) go to [facility] [address] [place] [phone]) call 911 if you are experiencing a Mental Health or Behavioral Health Crisis or need someone to talk to.  Patient verbalized understanding of Care plan and visit instructions communicated this visit  Orlean Fey, BSW [employer] [facility]  Value Based Care Institute Social Worker, [employer] [facility] [phone]

## 2024-08-29 ENCOUNTER — Other Ambulatory Visit: Payer: Self-pay | Admitting: *Deleted

## 2024-08-29 NOTE — Patient Instructions (Signed)
 Visit Information  Thank you for taking time to visit with me today. Please don't hesitate to contact me if I can be of assistance to you before our next scheduled telephone appointment.   Following is a copy of your care plan:   Goals Addressed             This Visit's Progress    VBCI Transitions of Care (TOC) Care Plan       Problems:  Recent Hospitalization for treatment of CHF and CAP Knowledge Deficit Related to CHF management and Medication access barrier due to no insurance and needing refills for prescribed medications  Goal:  Over the next 30 days, the patient will not experience hospital readmission  Interventions:  Transitions of Care: Doctor Visits  - discussed the importance of doctor visits Post discharge activity limitations prescribed by provider reviewed Reviewed diet, exercise, daily weights Medication review, patient has not received Xarelto , RNCM sent secure message to office for update Advised patient to take all medications to [facility] Clinic on 09/01/24 Reviewed blood sugar Utilized Spanish interpreter [phone] via [employer] BSW referral for housing resources-scheduled on 08/25/24 with [facility]-received resources Advised patient to purchase a BP monitor and start checking BP daily Assisted with scheduling PCP hospital follow up on 09/23/24-first available  Patient Self Care Activities:  Attend all scheduled provider appointments Call pharmacy for medication refills 3-7 days in advance of running out of medications Call provider office for new concerns or questions  Notify RN Care Manager of TOC call rescheduling needs Participate in Transition of Care Program/Attend TOC scheduled calls Take medications as prescribed   Work with the social worker to address care coordination needs and will continue to work with the clinical team to address health care and disease management related needs call office if I gain more than 2 pounds in one day or 5 pounds  in one week use salt in moderation watch for swelling in feet, ankles and legs every day eat more whole grains, fruits and vegetables, lean meats and healthy fats  Plan:  Telephone follow up appointment with care management team member scheduled for:  09/09/24 at 11am        The patient verbalized understanding of instructions, educational materials, and care plan provided today and agreed to receive a mailed copy of patient instructions, educational materials, and care plan.   Telephone follow up appointment with care management team member scheduled for:09/10/24 at 11am  Please call the care guide team at [phone] if you need to cancel or reschedule your appointment.   Please call 1-800-273-TALK (toll free, 24 hour hotline) go to [facility] Urgent [facility] [address], [place] [phone]) call 911 if you are experiencing a Mental Health or Behavioral Health Crisis or need someone to talk to.  Andrea Dimes RN, BSN [place]  Value-Based Care Institute [facility] Health RN Care Manager [phone]

## 2024-08-29 NOTE — Transitions of Care (Post Inpatient/ED Visit) (Signed)
 " Transition of Care week 2  Visit Note  08/29/2024  Name: Justin [name] MRN: [id number]          DOB: 1981-06-04  Situation: Patient enrolled in [facility] 30-day program. Visit completed with [name] by telephone.   Background:   Initial Transition Care Management Follow-up Telephone Call Discharge Date and Diagnosis: 08/20/24, Community acquired pneumonia   Past Medical History:  Diagnosis Date   Heart failure with reduced ejection fraction [facility]) 2017   [facility]   Hypertension 2017   [facility]    Assessment: Patient Reported Symptoms: Cognitive Cognitive Status: Able to follow simple commands, Alert and oriented to person, place, and time, Normal speech and language skills (Research Officer, [employer] utilized)      Neurological Neurological Review of Symptoms: No symptoms reported    HEENT HEENT Symptoms Reported: Not assessed      Cardiovascular Cardiovascular Symptoms Reported: No symptoms reported Cardiovascular Self-Management Outcome: 4 (good) Cardiovascular Comment: Patient received requested medications and taking as directed. He continues to wait for Xarelto . RNCM sent secure message to the Cardiology office to request an update. Revisited the importance of purchasing a BP monitor and checking BP daily. Patient is scheduled with [facility] Clinic on 09/01/24, RNCM advised taking all medications to this appointment.  Respiratory Respiratory Symptoms Reported: No symptoms reported    Endocrine Endocrine Symptoms Reported: No symptoms reported Is patient diabetic?: Yes Is patient checking blood sugars at home?: Yes List most recent blood sugar readings, include date and time of day: Patient checks BS everyother day. BS on 08/28/24 was 215-patient unsure of the time    Gastrointestinal Gastrointestinal Symptoms Reported: No symptoms reported      Genitourinary Genitourinary Symptoms Reported: No symptoms reported    Integumentary  Integumentary Symptoms Reported: No symptoms reported    Musculoskeletal Musculoskelatal Symptoms Reviewed: No symptoms reported        Psychosocial Psychosocial Symptoms Reported: Not assessed         There were no vitals filed for this visit.    Medications Reviewed Today     Reviewed by Lucky Andrea LABOR, RN (Registered Nurse) on 08/29/24 at 1031  Med List Status: <None>   Medication Order Taking? Sig Documenting Provider Last Dose Status Informant  amiodarone  (PACERONE ) 200 MG tablet [id number] Yes Take 1 tablet (200mg ) by mouth twice a day for 2 weeks, and then 1 tablet (200mg ) daily. Fairy Frames, MD  Active   amLODipine  (NORVASC ) 10 MG tablet [id number] Yes Take 1 tablet (10 mg total) by mouth daily. Fairy Frames, MD  Active   atorvastatin  (LIPITOR) 20 MG tablet [id number] Yes Take 1 tablet (20 mg total) by mouth daily. Newlin, Enobong, MD  Active   Fingerstix Lancets MISC [id number] Yes Use as directed up to four times daily. Fairy Frames, MD  Active   glimepiride  (AMARYL ) 2 MG tablet [id number] Yes Take 1 tablet (2 mg total) by mouth daily. Newlin, Enobong, MD  Active   glucose blood (TRUE METRIX PRO BLOOD GLUCOSE) test strip [id number] Yes Use up to four times daily as directed. (FOR ICD-10 E10.9, E11.9). Fairy Frames, MD  Active   hydrALAZINE  (APRESOLINE ) 100 MG tablet [id number] Yes Take 1 tablet (100 mg total) by mouth with breakfast, with lunch, and with evening meal. Newlin, Enobong, MD  Active   insulin  aspart (NOVOLOG ) 100 UNIT/ML FlexPen [id number] Yes Inject 5 Units into the skin 3 (three) times daily with meals. Fairy Frames, MD  Active  insulin  glargine (LANTUS ) 100 UNIT/ML Solostar Pen [id number] Yes Inject 35 Units into the skin daily. Fairy Frames, MD  Active   Insulin  Pen Needle (PEN NEEDLES) 32G X 4 MM MISC [id number] Yes Use on Insulin  pens Joseph, Preetha, MD  Active   isosorbide  mononitrate (IMDUR ) 120 MG 24 hr tablet [id number]  Take 1 tablet (120 mg  total) by mouth daily. Fairy Frames, MD  Active   Lancet Device MISC [id number] Yes Use as directed up to 4 times daily. Fairy Frames, MD  Active   potassium chloride  SA (KLOR-CON  M) 20 MEQ tablet [id number] Yes Take 1 tablet (20 mEq total) by mouth 2 (two) times daily. Fairy Frames, MD  Active   rivaroxaban  (XARELTO ) 20 MG TABS tablet [id number]  Take 20 mg by mouth daily with supper.  Patient not taking: Reported on 08/29/2024   [provider]  Active   torsemide  (DEMADEX ) 20 MG tablet [id number] Yes Take 2 tablets (40 mg total) by mouth daily. Fairy Frames, MD  Active              Recommendation:   Continue Current Plan of Care  Follow Up Plan:   Telephone follow-up in 1 week  Andrea Dimes RN, BSN [place]  Value-Based Care Institute [facility] RN Care Manager [phone]     "

## 2024-09-01 ENCOUNTER — Other Ambulatory Visit: Payer: Self-pay

## 2024-09-01 ENCOUNTER — Ambulatory Visit (HOSPITAL_COMMUNITY)
Admit: 2024-09-01 | Discharge: 2024-09-01 | Disposition: A | Discharge status: Home / Self Care | Payer: MEDICAID | Source: Ambulatory Visit | Attending: Cardiology | Admitting: Cardiology

## 2024-09-01 ENCOUNTER — Encounter (HOSPITAL_COMMUNITY): Payer: Self-pay

## 2024-09-01 VITALS — BP 138/80 | HR 95 | Wt 285.6 lb

## 2024-09-01 DIAGNOSIS — Z79899 Other long term (current) drug therapy: Secondary | ICD-10-CM | POA: Insufficient documentation

## 2024-09-01 DIAGNOSIS — I130 Hypertensive heart and chronic kidney disease with heart failure and stage 1 through stage 4 chronic kidney disease, or unspecified chronic kidney disease: Secondary | ICD-10-CM | POA: Insufficient documentation

## 2024-09-01 DIAGNOSIS — E8770 Fluid overload, unspecified: Secondary | ICD-10-CM | POA: Insufficient documentation

## 2024-09-01 DIAGNOSIS — Z7984 Long term (current) use of oral hypoglycemic drugs: Secondary | ICD-10-CM | POA: Insufficient documentation

## 2024-09-01 DIAGNOSIS — I428 Other cardiomyopathies: Secondary | ICD-10-CM | POA: Insufficient documentation

## 2024-09-01 DIAGNOSIS — E1165 Type 2 diabetes mellitus with hyperglycemia: Secondary | ICD-10-CM | POA: Insufficient documentation

## 2024-09-01 DIAGNOSIS — Z5971 Insufficient health insurance coverage: Secondary | ICD-10-CM | POA: Insufficient documentation

## 2024-09-01 DIAGNOSIS — I5022 Chronic systolic (congestive) heart failure: Secondary | ICD-10-CM | POA: Insufficient documentation

## 2024-09-01 DIAGNOSIS — R590 Localized enlarged lymph nodes: Secondary | ICD-10-CM | POA: Insufficient documentation

## 2024-09-01 DIAGNOSIS — I340 Nonrheumatic mitral (valve) insufficiency: Secondary | ICD-10-CM | POA: Insufficient documentation

## 2024-09-01 DIAGNOSIS — I4891 Unspecified atrial fibrillation: Secondary | ICD-10-CM | POA: Insufficient documentation

## 2024-09-01 DIAGNOSIS — N184 Chronic kidney disease, stage 4 (severe): Secondary | ICD-10-CM | POA: Insufficient documentation

## 2024-09-01 DIAGNOSIS — E1122 Type 2 diabetes mellitus with diabetic chronic kidney disease: Secondary | ICD-10-CM | POA: Insufficient documentation

## 2024-09-01 DIAGNOSIS — Z7901 Long term (current) use of anticoagulants: Secondary | ICD-10-CM | POA: Insufficient documentation

## 2024-09-01 DIAGNOSIS — Z794 Long term (current) use of insulin: Secondary | ICD-10-CM | POA: Insufficient documentation

## 2024-09-01 LAB — BASIC METABOLIC PANEL WITH GFR
Anion gap: 11 (ref 5–15)
BUN: 30 mg/dL — ABNORMAL HIGH (ref 6–20)
CO2: 27 mmol/L (ref 22–32)
Calcium: 9.3 mg/dL (ref 8.9–10.3)
Chloride: 100 mmol/L (ref 98–111)
Creatinine, Ser: 1.94 mg/dL — ABNORMAL HIGH (ref 0.61–1.24)
GFR, Estimated: 43 mL/min — ABNORMAL LOW
Glucose, Bld: 197 mg/dL — ABNORMAL HIGH (ref 70–99)
Potassium: 3.9 mmol/L (ref 3.5–5.1)
Sodium: 138 mmol/L (ref 135–145)

## 2024-09-01 MED ORDER — TORSEMIDE 20 MG PO TABS
80.0000 mg | ORAL_TABLET | Freq: Every day | ORAL | 3 refills | Status: DC
  Filled 2024-09-01: qty 120, 30d supply, fill #0

## 2024-09-01 MED ORDER — POTASSIUM CHLORIDE CRYS ER 20 MEQ PO TBCR
EXTENDED_RELEASE_TABLET | ORAL | 3 refills | Status: DC
  Filled 2024-09-01: qty 90, 30d supply, fill #0

## 2024-09-01 NOTE — Progress Notes (Signed)
 Medication Samples have been provided to the patient.  Drug name: Eliquis       Strength: 5 mg        Qty: 3  LOT: [id number]  Exp.Date: 02/2026  Dosing instructions: take 1 tab Twice daily   The patient has been instructed regarding the correct time, dose, and frequency of taking this medication, including desired effects and most common side effects.    No samples of Xarelto , pharmacy team is working on pt assistance, for now will switch to Eliquis 5 mg BID and provide samples per Caffie Shed, PA. Pt aware via interpreter and verbalized understanding  Powell Latino 3:27 PM 09/01/2024

## 2024-09-01 NOTE — Patient Instructions (Addendum)
 Cambios en la medicacin:  Aumentar la dosis de Torsemida a 80 mg (4 comprimidos) al da.  Aumentar la dosis de potasio a 40 mEq (2 comprimidos) por la maana y 20 mEq (1 comprimido) por la noche.  Tomar Eliquis 5 mg dos veces al da; le hemos proporcionado muestras de [place].  Anlisis de laboratorio:  Se le realizaron anlisis de laboratorio hoy; sus resultados Risk Manager. Nos comunicaremos con usted si hay resultados anormales.  Su mdico recomienda que regrese para un nuevo anlisis de laboratorio en: 1 semana.   Instrucciones especiales / Educacin:  Realice lo siguiente TODOS LOS DAS: 1) Psese por la maana antes del desayuno. Anote su peso y llvelo en un registro. 2) Tome sus medicamentos segn lo recetado. 3) Consuma alimentos bajos en sal; limite el consumo de sal (sodio) a 2000 mg por da. 4) Mantngase lo ms national [place]. 5) Limite la ingesta total de lquidos a menos de 2 litros al c[place] worldwide.  Seguimiento en: 3 semanas  En la [facility] Insuficiencia Cardaca, usted y sus necesidades de salud son nuestra prioridad. Contamos con un equipo especializado en el tratamiento de la insuficiencia cardaca. Este equipo de atencin incluye a su cardilogo especialista en insuficiencia cardaca, profesionales de prctica avanzada (asistentes mdicos y enfermeros especializados) y un farmacutico, quienes trabajan en conjunto para brindarle la atencin que necesita, cuando la necesita.  En su prxima cita de seguimiento, podr ser atendido por cualquiera de los siguientes profesionales de su equipo de atencin:   Dr. Toribio Fuel  Dr. [name]  Dr. Odis Brownie  Amy Lenetta, NP  Caffie Shed, [place]  Harlene Gainer, NP  Manuelita Dutch, [place]  Beckey Coe, NP  Jordan Lee, NP  Tinnie Redman, PharmD  Por favor, asegrese de traer todos sus frascos de medicamentos a cada cita.  Necesita contactarnos?  Si tiene alguna  pregunta o inquietud antes de su prxima cita, envenos un mensaje a travs de MyChart o llame a nuestra oficina al [phone].  Para dejar un mensaje a la enfermera, seleccione la opcin 2. Por favor, incluya la siguiente informacin en su mensaje:  Su nombre  Fecha de nacimiento  Nmero de telfono para que le devolvamos la llamada  Motivo de la llamada (esto es importante, ya que priorizamos las llamadas segn el motivo)  Recibir una llamada el mismo da si llama antes de las 4:00 p. m.

## 2024-09-01 NOTE — Progress Notes (Signed)
 "  Advanced Heart Failure Clinic Note    PCP: Celestia Rosaline SQUIBB, NP PCP-Cardiologist: Shelda Bruckner, MD  AHFC: Dr. Rolan   Chief Complaint: [facility] f/u for systolic heart failure and AFL   HPI:  43 y.o. Spanish-speaking male with history of chronic systolic CHF d/t NICM (prior cardiac MRI concerning for possible sarcoid but pt never completed f/u PET), uncontrolled HTN, poorly controlled DM II, mitral regurgitation, ETOH abuse, hx cocaine use, Afib/AFL and poor compliance, recently admitted 12/25 w/ a/c CHF w/ marked volume overload and low output and was back in AFL w/ RVR on admission. Initial co-ox 47%. Started on DBA + amio gtts. Respiratory panel + for rhinovirus. Diuresed w/ IV Lasix  then underwent TEE guided DCCV. TEE EF 20-25%, RV mod reduced, no LA thrombus. Had successful cardioversion back to NSR. He was transitioned to PO amiodarone  and weaned off DBA w/ stable co-ox. Transitioned to PO torsemide  and GDMT, though limited by CKD. He was not placed on an SGLT2i given poorly controlled DM, Hgb A1c 13. Discharge wt 267 lb.   He presents today for post hospital f/u. Here w/ interpreter. Says he has been doing better. Breathing overall improved. No resting dyspnea. Reports improved NYHA Class II symptoms but c/w orthopnea, sleeps w/ 2 pillows. No PND. He has noticed about at 5 lb wt gain on his home scale and has mild LEE on exam. His wt in clinic today is up to 285 lb. EKG shows NSR. He reports compliance w/ all meds except Xarelto . He says he was not given supply/samples at hospital d/c. Application for patient assistance pending. He reports compliance w/ Torsemide  but says UOP is not robust.     Review of Systems: [y] = yes, [ ]  = no   General: Weight gain [ ] ; Weight loss [ ] ; Anorexia [ ] ; Fatigue [ ] ; Fever [ ] ; Chills [ ] ; Weakness [ ]   Cardiac: Chest pain/pressure [ ] ; Resting SOB [ ] ; Exertional SOB [ ] ; Orthopnea [ ] ; Pedal Edema [ ] ; Palpitations [ ] ; Syncope [  ]; Presyncope [ ] ; Paroxysmal nocturnal dyspnea[ ]   Pulmonary: Cough [ ] ; Wheezing[ ] ; Hemoptysis[ ] ; Sputum [ ] ; Snoring [ ]   GI: Vomiting[ ] ; Dysphagia[ ] ; Melena[ ] ; Hematochezia [ ] ; Heartburn[ ] ; Abdominal pain [ ] ; Constipation [ ] ; Diarrhea [ ] ; BRBPR [ ]   GU: Hematuria[ ] ; Dysuria [ ] ; Nocturia[ ]   Vascular: Pain in legs with walking [ ] ; Pain in feet with lying flat [ ] ; Non-healing sores [ ] ; Stroke [ ] ; TIA [ ] ; Slurred speech [ ] ;  Neuro: Headaches[ ] ; Vertigo[ ] ; Seizures[ ] ; Paresthesias[ ] ;Blurred vision [ ] ; Diplopia [ ] ; Vision changes [ ]   Ortho/Skin: Arthritis [ ] ; Joint pain [ ] ; Muscle pain [ ] ; Joint swelling [ ] ; Back Pain [ ] ; Rash [ ]   Psych: Depression[ ] ; Anxiety[ ]   Heme: Bleeding problems [ ] ; Clotting disorders [ ] ; Anemia [ ]   Endocrine: Diabetes [ ] ; Thyroid  dysfunction[ ]    Past Medical History:  Diagnosis Date   Heart failure with reduced ejection fraction (HCC) 2017   [facility]   Hypertension 2017   [facility]    Current Outpatient Medications  Medication Sig Dispense Refill   amiodarone  (PACERONE ) 200 MG tablet Take 1 tablet (200mg ) by mouth twice a day for 2 weeks, and then 1 tablet (200mg ) daily. (Patient taking differently: Take 200 mg by mouth daily. Take 1 tablet (200mg ) by mouth twice a  day for 2 weeks, and then 1 tablet (200mg ) daily.) 60 tablet 1   amLODipine  (NORVASC ) 10 MG tablet Take 1 tablet (10 mg total) by mouth daily. 60 tablet 0   atorvastatin  (LIPITOR) 20 MG tablet Take 1 tablet (20 mg total) by mouth daily. 30 tablet 2   Fingerstix Lancets MISC Use as directed up to four times daily. 100 each 0   glimepiride  (AMARYL ) 2 MG tablet Take 1 tablet (2 mg total) by mouth daily. 30 tablet 2   glucose blood (TRUE METRIX PRO BLOOD GLUCOSE) test strip Use up to four times daily as directed. (FOR ICD-10 E10.9, E11.9). 100 each 0   hydrALAZINE  (APRESOLINE ) 100 MG tablet Take 1 tablet (100 mg total) by mouth with breakfast, with  lunch, and with evening meal. 90 tablet 2   insulin  aspart (NOVOLOG ) 100 UNIT/ML FlexPen Inject 5 Units into the skin 3 (three) times daily with meals. 30 mL 0   insulin  glargine (LANTUS ) 100 UNIT/ML Solostar Pen Inject 35 Units into the skin daily. 20 mL 0   Insulin  Pen Needle (PEN NEEDLES) 32G X 4 MM MISC Use on Insulin  pens 100 each 0   isosorbide  mononitrate (IMDUR ) 120 MG 24 hr tablet Take 1 tablet (120 mg total) by mouth daily. 60 tablet 0   Lancet Device MISC Use as directed up to 4 times daily. 1 each 0   potassium chloride  SA (KLOR-CON  M) 20 MEQ tablet Take 1 tablet (20 mEq total) by mouth 2 (two) times daily. 60 tablet 0   torsemide  (DEMADEX ) 20 MG tablet Take 2 tablets (40 mg total) by mouth daily. 60 tablet 0   rivaroxaban  (XARELTO ) 20 MG TABS tablet Take 20 mg by mouth daily with supper. (Patient not taking: Reported on 09/01/2024)     No current facility-administered medications for this encounter.    Allergies[1]    Social History   Socioeconomic History   Marital status: Single    Spouse name: Not on file   Number of children: 3   Years of education: Not on file   Highest education level: 7th grade  Occupational History   Occupation: education administrator    Comment: varies  Tobacco Use   Smoking status: Never   Smokeless tobacco: Never  Vaping Use   Vaping status: Never Used  Substance and Sexual Activity   Alcohol use: Yes    Alcohol/week: 30.0 standard drinks of alcohol    Types: 30 Cans of beer per week    Comment: occ   Drug use: Not Currently    Types: Crack cocaine, Cocaine    Comment: 2 weeks   Sexual activity: Not on file  Other Topics Concern   Not on file  Social History Narrative   Not on file   Social Drivers of Health   Tobacco Use: Low Risk (09/01/2024)   Patient History    Smoking Tobacco Use: Never    Smokeless Tobacco Use: Never    Passive Exposure: Not on file  Financial Resource Strain: Not at Risk (05/07/2024)   Received from [employer]    How hard is it for you to pay for the very basics like food, housing, heating, medical care, and medications?: 1  Food Insecurity: No Food Insecurity (08/21/2024)   Epic    Worried About Programme Researcher, Broadcasting/film/video in the Last Year: Never true    Ran Out of Food in the Last Year: Never true  Recent Concern: Food Insecurity -  Food Insecurity Present (08/11/2024)   Epic    Worried About Programme Researcher, Broadcasting/film/video in the Last Year: Sometimes true    [employer] of Food in the Last Year: Sometimes true  Transportation Needs: No Transportation Needs (08/21/2024)   Epic    Lack of Transportation (Medical): No    Lack of Transportation (Non-Medical): No  Physical Activity: At Risk (05/07/2024)   Received from [facility]   Physical Activity    On average, how many minutes do you engage in exercise at this level?: 2  Stress: Not at Risk (05/07/2024)   Received from [facility]   Stress    Do you feel these kinds of stress these days?: 1  Social Connections: Not at Risk (05/07/2024)   Received from [facility]   Social Connections    How often do you see or talk to people that you care about and feel close to? (For example: talking to friends on phone, visiting friends or family, going to church or club meetings): 1  Intimate Partner Violence: Not At Risk (08/21/2024)   Epic    Fear of Current or Ex-Partner: No    Emotionally Abused: No    Physically Abused: No    Sexually Abused: No  Depression (PHQ2-9): Low Risk (08/21/2024)   Depression (PHQ2-9)    PHQ-2 Score: 0  Alcohol Screen: Medium Risk (02/20/2022)   Alcohol Screen    Last Alcohol Screening Score (AUDIT): 12  Housing: High Risk (08/25/2024)   Epic    Unable to Pay for Housing in the Last Year: Yes    Number of Times Moved in the Last Year: Not on file    Homeless in the Last Year: No  Utilities: Not At Risk (08/21/2024)   Epic    Threatened with loss of utilities: No  Health Literacy: Inadequate Health Literacy (09/21/2023)   B1300  Health Literacy    Frequency of need for help with medical instructions: Often      Family History  Problem Relation Age of Onset   Hypertension Father     Vitals:   09/01/24 1436  BP: 138/80  Pulse: 95  SpO2: 96%  Weight: 129.5 kg (285 lb 9.6 oz)      PHYSICAL EXAM: General:  Well appearing, obese. No respiratory difficulty HEENT: normal Neck: supple. JVD 8-9 cm  Cor: PMI nondisplaced. Regular rate & rhythm. No rubs, gallops or murmurs. Lungs: clear Abdomen: soft, nontender, nondistended. No hepatosplenomegaly. No bruits or masses. Good bowel sounds. Extremities: no cyanosis, clubbing, rash, 1+ b/l pretibial edema Neuro: alert & oriented x 3, cranial nerves grossly intact. moves all 4 extremities w/o difficulty. Affect pleasant.  ECG: NSR 94 bpm, personally reviewed    ASSESSMENT & PLAN:  1. Chronic systolic CHF: Long history of nonischemic cardiomyopathy. Cath in 6/23 with no significant CAD, CI 1.99.  Substance abuse (cocaine and ETOH) thought to play a role in cardiomyopathy, also uncontrolled HTN in the past.  However, cardiac MRI was done in 6/23 showing LVEF 29%, LV severely dilated, asymmetric LVH with basal septum measuring 17 mm, RVEF 45%, patchy LGE in septum and RV insertion sites + subepicardial LGE in inferolateral wall. Scar pattern and asymmetric hypertrophy could be consistent with hypertrophic cardiomyopathy. However, due to subepicardial LGE in inferolateral wall and presence of mediastinal lymphadenopathy, cardiac PET recommended to rule out cardiac sarcoidosis.  This was never done due to lack of insurance.  Echo in 7/25 showed EF 20-25% with moderate concentric LVH, severe RV dysfunction, probably  severe functional MR with PISA ERO 0.47 cm^2, IVC dilated.  Low output HF noted at 7/25 admission, he was started on milrinone  and diuresed, milrinone  eventually discontinued.  Recent admit 12/25 w/ a/c CHF w/ low output, in setting of recurrent AFL w/ RVR. Initial  Co-ox 47%. Started on DBA and diuresed w/ IV Lasix . TEE 12/4 showed EF 20-25%, LV severely dilated, mod LVH, RV mod reduced, LA/RA severely dilated, mod MR. After diuresis, was weaned off of DBA w/ stable co-ox. Today, reports improved NYHA Class II symptoms. He has mild volume overload on exam and wt is trending back up. He reports compliance w/ torsemide  but UOP is not robust.  - Increase torsemide  to 80 mg daily and increase KCl to 40 mEq qam/ 20 mEq qpm. BMP/BNP today and again in 7 days  - continue hydralazine  100 mg TID + imdur  120 mg daily - off ARB/ARN/Spiro w/ elevated SCr - no SGLT2i given poor glycemic control, Hgb A1c 13.  - No ? blocker yet w/ recent w/ low output  - Would like to get a cardiac PET to rule out cardiac sarcoidosis, but this is probably not an option with no insurance. - Not a candidate for advanced therapies with noncompliance, lack of insurance, RV dysfunction and CKD stage 4.   2. Atrial fibrillation/atypical flutter: Underwent TEE-guided DCCV back to NSR 7/25 but was back in [facility] 12/25 admission. S/p repeat TEE/DC-CV 12/4. Maintaining NSR on today's EKG - continue amiodarone  200 mg daily  - he cannot yet afford Xarelto  (waiting pt assistance application approval and we do not have samples). Will switch to Eliquis for now. Will provide samples given recent DCCV. 5 mg bid  - suspect has OSA, will be unable to perform sleep study w/o insurance   3. CKD stage IV: Suspect cardiorenal syndrome + DM2 + HTN.  Last creatinine 2.5  - check BMP today  - no SGLT2i yet w/ poorly controlled DM    4. Mitral regurgitation: Severe functional MR.  TEE 7/25 showed severe eccentric, posteriorly-directed MR with restricted posterior leaflet, MR ERO 0.6 cm^2 by PISA.  - continue HF/AFL optimization per above    5. ETOH/cocaine abuse: Says he has quit. UDS negative on 12/25 admission    6. DM2: Poor control.   - hgb a1c 13.9% - insulin  + glimepiride  - f/u w/ PCP     F/u w/ APP  in 2 wks    Timia Casselman, PA-C 09/01/2024      [1] No Known Allergies  "

## 2024-09-02 ENCOUNTER — Ambulatory Visit (HOSPITAL_COMMUNITY): Payer: Self-pay | Admitting: Cardiology

## 2024-09-02 NOTE — Telephone Encounter (Signed)
 Spoke to representative who is unable to locate account for patient, although patient was previously approved and suspended.  Forms refaxed on 09/02/2024, will continue to follow up.

## 2024-09-08 NOTE — Telephone Encounter (Signed)
 Spoke to representative with [employer] who stated that updated address and forms were received, however they do not see updated ICD code. Forms refaxed on 09/08/2024.

## 2024-09-10 ENCOUNTER — Ambulatory Visit (HOSPITAL_COMMUNITY): Payer: Self-pay | Admitting: Family Medicine

## 2024-09-10 ENCOUNTER — Other Ambulatory Visit: Payer: Self-pay

## 2024-09-10 ENCOUNTER — Other Ambulatory Visit: Payer: Self-pay | Admitting: *Deleted

## 2024-09-10 ENCOUNTER — Ambulatory Visit (HOSPITAL_COMMUNITY)
Admission: RE | Admit: 2024-09-10 | Discharge: 2024-09-10 | Disposition: A | Discharge status: Home / Self Care | Payer: MEDICAID | Source: Ambulatory Visit | Attending: Cardiology | Admitting: Cardiology

## 2024-09-10 DIAGNOSIS — I5032 Chronic diastolic (congestive) heart failure: Secondary | ICD-10-CM | POA: Insufficient documentation

## 2024-09-10 LAB — BASIC METABOLIC PANEL WITH GFR
Anion gap: 12 (ref 5–15)
BUN: 21 mg/dL — ABNORMAL HIGH (ref 6–20)
CO2: 28 mmol/L (ref 22–32)
Calcium: 9.4 mg/dL (ref 8.9–10.3)
Chloride: 98 mmol/L (ref 98–111)
Creatinine, Ser: 1.98 mg/dL — ABNORMAL HIGH (ref 0.61–1.24)
GFR, Estimated: 42 mL/min — ABNORMAL LOW
Glucose, Bld: 289 mg/dL — ABNORMAL HIGH (ref 70–99)
Potassium: 3.5 mmol/L (ref 3.5–5.1)
Sodium: 137 mmol/L (ref 135–145)

## 2024-09-10 NOTE — Patient Instructions (Signed)
 Visit Information  Thank you for taking time to visit with me today. Please don't hesitate to contact me if I can be of assistance to you before our next scheduled telephone appointment.   Following is a copy of your care plan:   Goals Addressed             This Visit's Progress    VBCI Transitions of Care (TOC) Care Plan       Problems:  Recent Hospitalization for treatment of CHF and CAP Knowledge Deficit Related to CHF management and Medication access barrier due to no insurance and needing refills for prescribed medications  Goal:  Over the next 30 days, the patient will not experience hospital readmission  Interventions:  Transitions of Care: Doctor Visits  - discussed the importance of doctor visits Post discharge activity limitations prescribed by provider reviewed Reviewed diet, exercise, daily weights Medication review, patient received samples of Eliquis and taking as directed Reviewed blood sugar Utilized Spanish interpreter [phone] via [employer] BSW referral for housing resources-scheduled on 08/25/24 with Shirley-received resources Advised patient to purchase a BP monitor and start checking BP daily Assisted with scheduling PCP hospital follow up on 09/23/24-first available Reviewed provider note from HF Clinic and discussed Reviewed upcoming appointments including:PCP on 09/23/24, HF clinic on 09/25/24 and Diabetic Education on 10/02/24  Patient Self Care Activities:  Attend all scheduled provider appointments Call pharmacy for medication refills 3-7 days in advance of running out of medications Call provider office for new concerns or questions  Notify RN Care Manager of TOC call rescheduling needs Participate in Transition of Care Program/Attend TOC scheduled calls Take medications as prescribed   Work with the social worker to address care coordination needs and will continue to work with the clinical team to address health care and disease management  related needs call office if I gain more than 2 pounds in one day or 5 pounds in one week use salt in moderation watch for swelling in feet, ankles and legs every day eat more whole grains, fruits and vegetables, lean meats and healthy fats  Plan:  Telephone follow up appointment with care management team member scheduled for:  09/17/24 at 2pm        Patient verbalizes understanding of instructions and care plan provided today and agrees to view in MyChart. Active MyChart status and patient understanding of how to access instructions and care plan via MyChart confirmed with patient.     Telephone follow up appointment with care management team member scheduled for:09/17/24 at 2pm  Please call the care guide team at [phone] if you need to cancel or reschedule your appointment.   Please call [phone]-TALK (toll free, 24 hour hotline) go to [facility] Urgent [facility] [address] [place] [phone]) call 911 if you are experiencing a Mental Health or Behavioral Health Crisis or need someone to talk to.  Andrea Dimes RN, BSN [place]  [facility] RN Care Manager [phone]

## 2024-09-10 NOTE — Transitions of Care (Post Inpatient/ED Visit) (Signed)
 " Transition of Care week 3  Visit Note  09/10/2024  Name: Justin [name] MRN: [id number]          DOB: June 12, 1981  Situation: Patient enrolled in [facility] 30-day program. Visit completed with [name] by telephone.   Background:   Initial Transition Care Management Follow-up Telephone Call Discharge Date and Diagnosis: 08/20/24, Community acquired pneumonia   Past Medical History:  Diagnosis Date   Heart failure with reduced ejection fraction [facility]) 2017   [facility]   Hypertension 2017   [facility]    Assessment: Patient Reported Symptoms: Cognitive Cognitive Status: Able to follow simple commands, Alert and oriented to person, place, and time, Normal speech and language skills (spanish interpreter utilized)      Neurological Neurological Review of Symptoms: No symptoms reported    HEENT HEENT Symptoms Reported: Not assessed      Cardiovascular Cardiovascular Symptoms Reported: Swelling in legs or feet (swelling in feet 3 days ago. None today) Does patient have uncontrolled Hypertension?: Yes Is patient checking Blood Pressure at home?: No Cardiovascular Self-Management Outcome: 4 (good) Cardiovascular Comment: Patient seen at the HF clinic, taking Eliquis. Patient is scheduled for lab work today and will follow up with HF on 09/25/24  Respiratory Respiratory Symptoms Reported: No symptoms reported    Endocrine Endocrine Symptoms Reported: No symptoms reported Is patient diabetic?: Yes Is patient checking blood sugars at home?: Yes List most recent blood sugar readings, include date and time of day: Patient checking BS every other day. Last BS yesterday was 200. Endocrine Self-Management Outcome: 3 (uncertain) Endocrine Comment: Patient reports using insulin  as prescribed. RNCM advised checking BS 3 times daily, recording and share with provider. Patient is scheduled for diabetic education on 10/02/24  Gastrointestinal Gastrointestinal  Symptoms Reported: No symptoms reported      Genitourinary Genitourinary Symptoms Reported: No symptoms reported    Integumentary Integumentary Symptoms Reported: No symptoms reported    Musculoskeletal Musculoskelatal Symptoms Reviewed: No symptoms reported        Psychosocial Psychosocial Symptoms Reported: Not assessed         There were no vitals filed for this visit. Pain Score: 0-No pain  Medications Reviewed Today     Reviewed by Lucky Andrea LABOR, RN (Registered Nurse) on 09/10/24 at 1151  Med List Status: <None>   Medication Order Taking? Sig Documenting Provider Last Dose Status Informant  amiodarone  (PACERONE ) 200 MG tablet [id number] Yes Take 1 tablet (200mg ) by mouth twice a day for 2 weeks, and then 1 tablet (200mg ) daily. Fairy Frames, MD  Active   amLODipine  (NORVASC ) 10 MG tablet [id number] Yes Take 1 tablet (10 mg total) by mouth daily. Fairy Frames, MD  Active   apixaban (ELIQUIS) 5 MG TABS tablet [id number] Yes Take 5 mg by mouth 2 (two) times daily. [provider]  Active   atorvastatin  (LIPITOR) 20 MG tablet [id number] Yes Take 1 tablet (20 mg total) by mouth daily. Newlin, Enobong, MD  Active   Fingerstix Lancets MISC [id number] Yes Use as directed up to four times daily. Fairy Frames, MD  Active   glimepiride  (AMARYL ) 2 MG tablet [id number] Yes Take 1 tablet (2 mg total) by mouth daily. Newlin, Enobong, MD  Active   glucose blood (TRUE METRIX PRO BLOOD GLUCOSE) test strip [id number] Yes Use up to four times daily as directed. (FOR ICD-10 E10.9, E11.9). Fairy Frames, MD  Active   hydrALAZINE  (APRESOLINE ) 100 MG tablet [id number] Yes Take 1 tablet (100  mg total) by mouth with breakfast, with lunch, and with evening meal. Newlin, Enobong, MD  Active   insulin  aspart (NOVOLOG ) 100 UNIT/ML FlexPen [id number] Yes Inject 5 Units into the skin 3 (three) times daily with meals. Fairy Frames, MD  Active   insulin  glargine (LANTUS ) 100 UNIT/ML Solostar Pen  [id number] Yes Inject 35 Units into the skin daily. Fairy Frames, MD  Active   Insulin  Pen Needle (PEN NEEDLES) 32G X 4 MM MISC [id number] Yes Use on Insulin  pens Fairy Frames, MD  Active   isosorbide  mononitrate (IMDUR ) 120 MG 24 hr tablet [id number] Yes Take 1 tablet (120 mg total) by mouth daily. Fairy Frames, MD  Active   Lancet Device MISC [id number] Yes Use as directed up to 4 times daily. Fairy Frames, MD  Active   potassium chloride  SA (KLOR-CON  M) 20 MEQ tablet [id number] Yes Take 2 tablets (40 mEq total) by mouth every morning AND 1 tablet (20 mEq total) every evening. Marcine Catalan M, PA-C  Active   rivaroxaban  (XARELTO ) 20 MG TABS tablet [id number]  Take 20 mg by mouth daily with supper.  Patient not taking: Reported on 09/10/2024   [provider]  Active   torsemide  (DEMADEX ) 20 MG tablet [id number] Yes Take 4 tablets (80 mg total) by mouth daily. Marcine Catalan HERO, PA-C  Active             Recommendation:   Continue Current Plan of Care  Follow Up Plan:   Telephone follow-up in 1 week  Andrea Dimes RN, BSN [place]  [facility] RN Care Manager [phone]     "

## 2024-09-11 ENCOUNTER — Other Ambulatory Visit: Payer: Self-pay | Admitting: Pharmacist

## 2024-09-11 MED ORDER — INSULIN LISPRO (1 UNIT DIAL) 100 UNIT/ML (KWIKPEN)
5.0000 [IU] | PEN_INJECTOR | Freq: Three times a day (TID) | SUBCUTANEOUS | 0 refills | Status: DC
  Filled 2024-09-11: qty 3, 20d supply, fill #0

## 2024-09-11 MED ORDER — BASAGLAR KWIKPEN 100 UNIT/ML ~~LOC~~ SOPN
35.0000 [IU] | PEN_INJECTOR | Freq: Every day | SUBCUTANEOUS | 0 refills | Status: DC
  Filled 2024-09-11: qty 9, 25d supply, fill #0

## 2024-09-12 ENCOUNTER — Other Ambulatory Visit: Payer: Self-pay

## 2024-09-15 ENCOUNTER — Other Ambulatory Visit: Payer: Self-pay

## 2024-09-16 ENCOUNTER — Other Ambulatory Visit: Payer: Self-pay

## 2024-09-17 ENCOUNTER — Telehealth (HOSPITAL_COMMUNITY): Payer: Self-pay

## 2024-09-17 ENCOUNTER — Other Ambulatory Visit: Payer: Self-pay | Admitting: *Deleted

## 2024-09-17 NOTE — Telephone Encounter (Signed)
 Advanced Heart Failure Patient Advocate Encounter  Application for Xarelto  faxed to [employer] on 09/17/2024. Application form attached to patient chart.  Rachel DEL, CPhT Rx Patient Advocate Phone: [phone]

## 2024-09-17 NOTE — Transitions of Care (Post Inpatient/ED Visit) (Signed)
 " Transition of Care week 4  Visit Note  09/17/2024  Name: Justin [name] MRN: [id number]          DOB: 07/08/1981  Situation: Patient enrolled in [facility] 30-day program. Visit completed with Justin [name] by telephone.   Background:   Initial Transition Care Management Follow-up Telephone Call Discharge Date and Diagnosis: 08/20/24, Community acquired pneumonia   Past Medical History:  Diagnosis Date   Heart failure with reduced ejection fraction [facility]) 2017   [facility]   Hypertension 2017   [facility]    Assessment: Patient Reported Symptoms: Cognitive Cognitive Status: Able to follow simple commands, Alert and oriented to person, place, and time, Normal speech and language skills (Spanish Interpreter utilized)      Neurological Neurological Review of Symptoms: Headaches Neurological Management Strategies: Coping strategies, Medication therapy, Routine screening Neurological Self-Management Outcome: 3 (uncertain) Neurological Comment: Patient reports that sometimes he wakes up with a headache. Denies headache at this time.  HEENT HEENT Symptoms Reported: No symptoms reported      Cardiovascular Cardiovascular Symptoms Reported: Swelling in legs or feet Cardiovascular Management Strategies: Medication therapy, Routine screening, Coping strategies, Adequate rest Cardiovascular Self-Management Outcome: 3 (uncertain) Cardiovascular Comment: Reviewed the importance of daily weight and BP.  Respiratory Respiratory Symptoms Reported: No symptoms reported    Endocrine Endocrine Symptoms Reported: No symptoms reported Is patient diabetic?: Yes Is patient checking blood sugars at home?: Yes List most recent blood sugar readings, include date and time of day: Patient checks BS irregularly, unable to provide reading Endocrine Self-Management Outcome: 3 (uncertain) Endocrine Comment: Reviewed insulin  dosing and the importance of checking BS. Diet  reviewed. Scheduled with Diabetic education on 10/02/24  Gastrointestinal Gastrointestinal Symptoms Reported: Constipation Additional Gastrointestinal Details: LBM 09/17/24      Genitourinary Genitourinary Symptoms Reported: No symptoms reported    Integumentary Integumentary Symptoms Reported: No symptoms reported    Musculoskeletal Musculoskelatal Symptoms Reviewed: No symptoms reported        Psychosocial Psychosocial Symptoms Reported: Not assessed         There were no vitals filed for this visit. Pain Score: 0-No pain  Medications Reviewed Today     Reviewed by Lucky Andrea LABOR, RN (Registered Nurse) on 09/17/24 at 1427  Med List Status: <None>   Medication Order Taking? Sig Documenting Provider Last Dose Status Informant  amiodarone  (PACERONE ) 200 MG tablet [id number] Yes Take 1 tablet (200mg ) by mouth twice a day for 2 weeks, and then 1 tablet (200mg ) daily. Fairy Frames, MD  Active   amLODipine  (NORVASC ) 10 MG tablet [id number] Yes Take 1 tablet (10 mg total) by mouth daily. Fairy Frames, MD  Active   apixaban (ELIQUIS) 5 MG TABS tablet [id number] Yes Take 5 mg by mouth 2 (two) times daily. [provider]  Active   atorvastatin  (LIPITOR) 20 MG tablet [id number] Yes Take 1 tablet (20 mg total) by mouth daily. Newlin, Enobong, MD  Active   Fingerstix Lancets MISC [id number] Yes Use as directed up to four times daily. Fairy Frames, MD  Active   glimepiride  (AMARYL ) 2 MG tablet [id number] Yes Take 1 tablet (2 mg total) by mouth daily. Newlin, Enobong, MD  Active   glucose blood (TRUE METRIX PRO BLOOD GLUCOSE) test strip [id number] Yes Use up to four times daily as directed. (FOR ICD-10 E10.9, E11.9). Fairy Frames, MD  Active   hydrALAZINE  (APRESOLINE ) 100 MG tablet [id number] Yes Take 1 tablet (100 mg total) by mouth with breakfast, with  lunch, and with evening meal. Newlin, Enobong, MD  Active   Insulin  Glargine (BASAGLAR  KWIKPEN) 100 UNIT/ML [id number] Yes Inject 35  Units into the skin daily. Newlin, Enobong, MD  Active   insulin  lispro (HUMALOG  KWIKPEN) 100 UNIT/ML KwikPen [id number] Yes Inject 5 Units into the skin 3 (three) times daily. Newlin, Enobong, MD  Active   Insulin  Pen Needle (PEN NEEDLES) 32G X 4 MM MISC [id number] Yes Use on Insulin  pens Fairy Frames, MD  Active   isosorbide  mononitrate (IMDUR ) 120 MG 24 hr tablet [id number] Yes Take 1 tablet (120 mg total) by mouth daily. Fairy Frames, MD  Active   Lancet Device MISC [id number] Yes Use as directed up to 4 times daily. Fairy Frames, MD  Active   potassium chloride  SA (KLOR-CON  M) 20 MEQ tablet [id number] Yes Take 2 tablets (40 mEq total) by mouth every morning AND 1 tablet (20 mEq total) every evening. Marcine Catalan M, PA-C  Active   rivaroxaban  (XARELTO ) 20 MG TABS tablet [id number]  Take 20 mg by mouth daily with supper.  Patient not taking: Reported on 09/17/2024   [provider]  Active   torsemide  (DEMADEX ) 20 MG tablet [id number] Yes Take 4 tablets (80 mg total) by mouth daily. Marcine Catalan HERO, PA-C  Active             Recommendation:   Continue Current Plan of Care  Follow Up Plan:   Telephone follow-up in 1 week  Andrea Dimes RN, BSN [place]  [facility] RN Care Manager [phone]     "

## 2024-09-17 NOTE — Telephone Encounter (Signed)
 Spoke with [employer] representative who stated that a (new) new application for 2026 will need to be sent in. Updated forms filled out and faxed in separate encounter.

## 2024-09-17 NOTE — Patient Instructions (Signed)
 Visit Information  Thank you for taking time to visit with me today. Please don't hesitate to contact me if I can be of assistance to you before our next scheduled telephone appointment.   Following is a copy of your care plan:   Goals Addressed             This Visit's Progress    [facility] Transitions of Care (TOC) Care Plan       Problems:  Recent Hospitalization for treatment of CHF and CAP Knowledge Deficit Related to CHF management and Medication access barrier due to no insurance and needing refills for prescribed medications  Goal:  Over the next 30 days, the patient will not experience hospital readmission  Interventions:  Transitions of Care: Doctor Visits  - discussed the importance of doctor visits Post discharge activity limitations prescribed by provider reviewed Reviewed diet, exercise, daily weights Medication review, advised patient to take all medication to upcoming PCP appointment and request refills Utilized Spanish interpreter [phone] via Directv on daily weights Advised patient to purchase a BP monitor and start checking BP daily-revisited Reviewed upcoming appointments including:PCP on 09/23/24, HF clinic on 09/25/24 and Diabetic Education on 10/02/24  Patient Self Care Activities:  Attend all scheduled provider appointments Call pharmacy for medication refills 3-7 days in advance of running out of medications Call provider office for new concerns or questions  Notify RN Care Manager of TOC call rescheduling needs Participate in Transition of Care Program/Attend TOC scheduled calls Take medications as prescribed   Work with the social worker to address care coordination needs and will continue to work with the clinical team to address health care and disease management related needs call office if I gain more than 2 pounds in one day or 5 pounds in one week use salt in moderation watch for swelling in feet, ankles and legs every day eat  more whole grains, fruits and vegetables, lean meats and healthy fats  Plan:  Telephone follow up appointment with care management team member scheduled for:  09/24/24 at 11am        The patient verbalized understanding of instructions, educational materials, and care plan provided today and agreed to receive a mailed copy of patient instructions, educational materials, and care plan.   Telephone follow up appointment with care management team member scheduled for:09/24/24 at 11am  Please call the care guide team at [phone] if you need to cancel or reschedule your appointment.   Please call 1-800-273-TALK (toll free, 24 hour hotline) go to [facility] [address] [place] [phone]) call 911 if you are experiencing a Mental Health or Behavioral Health Crisis or need someone to talk to.  Andrea Dimes RN, BSN [place]  [facility] RN Care Manager [phone]

## 2024-09-19 NOTE — Telephone Encounter (Signed)
 Patient was approved to receive Xarelto  from [employer] Effective 09/17/2024 to 09/17/2025 Pt ID [id number]

## 2024-09-22 ENCOUNTER — Telehealth (INDEPENDENT_AMBULATORY_CARE_PROVIDER_SITE_OTHER): Payer: Self-pay | Admitting: Primary Care

## 2024-09-22 NOTE — Telephone Encounter (Signed)
 Spoke to pt about upcoming appt.. Will be present

## 2024-09-23 ENCOUNTER — Encounter (INDEPENDENT_AMBULATORY_CARE_PROVIDER_SITE_OTHER): Payer: Self-pay | Admitting: Primary Care

## 2024-09-23 ENCOUNTER — Other Ambulatory Visit: Payer: Self-pay

## 2024-09-23 ENCOUNTER — Ambulatory Visit (INDEPENDENT_AMBULATORY_CARE_PROVIDER_SITE_OTHER): Payer: Self-pay | Admitting: Primary Care

## 2024-09-23 VITALS — BP 142/90 | HR 95 | Resp 16 | Ht 66.0 in | Wt 308.0 lb

## 2024-09-23 DIAGNOSIS — E119 Type 2 diabetes mellitus without complications: Secondary | ICD-10-CM

## 2024-09-23 DIAGNOSIS — I509 Heart failure, unspecified: Secondary | ICD-10-CM

## 2024-09-23 DIAGNOSIS — Z09 Encounter for follow-up examination after completed treatment for conditions other than malignant neoplasm: Secondary | ICD-10-CM

## 2024-09-23 DIAGNOSIS — E662 Morbid (severe) obesity with alveolar hypoventilation: Secondary | ICD-10-CM

## 2024-09-23 NOTE — Progress Notes (Unsigned)
 "  Subjective:   Justin [name] is a 44 y.o. male presents for hospital follow up and establish care. Admit date to the hospital was 08/10/24, patient was discharged from the hospital on 08/20/24, patient was admitted for: Essential hypertension,Community acquired pneumonia, Acute on chronic HFrEF (heart failure with reduced ejection fraction) ,CKD stage 3b, GFR 30-44 ml/min , Uncontrolled hypertension, Type 2 diabetes mellitus without complication, without long-term current use of insulin  ,Atrial flutter with rapid ventricular response and Hypokalemia and Elevated troponin.  Past Medical History:  Diagnosis Date   Heart failure with reduced ejection fraction (HCC) 2017   [facility]   Hypertension 2017   [facility]    Allergies[1]  Medications Ordered Prior to Encounter[2]  Review of System: ROS Comprehensive ROS Pertinent positive and negative noted in HPI   Objective:  BP (!) 142/90   Pulse 95   Resp 16   Ht 5' 6 (1.676 m)   Wt (!) 308 lb (139.7 kg)   SpO2 90% Comment: room air  BMI 49.71 kg/m   Filed Weights   09/23/24 1516  Weight: (!) 308 lb (139.7 kg)   Physical Exam Vitals reviewed.  Constitutional:      Appearance: He is obese.     Comments: Severe morbid  HENT:     Head: Normocephalic.     Right Ear: Tympanic membrane and external ear normal.     Left Ear: Tympanic membrane and external ear normal.     Nose: Nose normal.  Eyes:     Extraocular Movements: Extraocular movements intact.     Pupils: Pupils are equal, round, and reactive to light.  Cardiovascular:     Rate and Rhythm: Normal rate and regular rhythm.  Pulmonary:     Effort: Pulmonary effort is normal.     Breath sounds: Wheezing present.     Comments: RUL and LUL Abdominal:     General: Bowel sounds are normal. There is distension.     Palpations: Abdomen is soft.  Musculoskeletal:        General: Normal range of motion.     Cervical back: Normal range  of motion and neck supple.  Skin:    General: Skin is warm and dry.  Neurological:     Mental Status: He is oriented to person, place, and time.  Psychiatric:        Mood and Affect: Mood normal.        Behavior: Behavior normal.        Thought Content: Thought content normal.        Judgment: Judgment normal.      Assessment:  Diagnoses and all orders for this visit:  Hospital discharge follow-up  Type 2 diabetes mellitus without complication, without long-term current use of insulin  (HCC) 13.9  Complications from uncontrolled diabetes -diabetic retinopathy leading to blindness, diabetic nephropathy leading to dialysis, decrease in circulation decrease in sores or wound healing which may lead to amputations and increase of heart attack and stroke . Effects on the liver- non-alcoholic fatty liver disease (NAFLD), which can progress to non-alcoholic steatohepatitis (NASH), cirrhosis, liver failure, and even liver cancer , lead to fat accumulation in the liver, causing NAFLD, and if left untreated, can lead to more severe liver damage.   Obesity hypoventilation syndrome (HCC) Sat's drop with exertion 82 % and 90 % resting wheezing bilateral RUQ/LUQ  Oxygen needed for home use   Acute congestive heart failure, unspecified heart failure type (HCC) Out of  torsemide  sent and called cardiology for advice on refilling. Cardiology appt  This note has been created with Dragon speech recognition software and paediatric nurse. Any transcriptional errors are unintentional.    Rosaline SHAUNNA Bohr, NP 09/23/2024, 3:39 PM        [1] No Known Allergies [2]  Current Outpatient Medications on File Prior to Visit  Medication Sig Dispense Refill   amiodarone  (PACERONE ) 200 MG tablet Take 1 tablet (200mg ) by mouth twice a day for 2 weeks, and then 1 tablet (200mg ) daily. 60 tablet 1   amLODipine  (NORVASC ) 10 MG tablet Take 1 tablet (10 mg total) by mouth daily. 60 tablet 0   apixaban  (ELIQUIS) 5 MG TABS tablet Take 5 mg by mouth 2 (two) times daily.     atorvastatin  (LIPITOR) 20 MG tablet Take 1 tablet (20 mg total) by mouth daily. 30 tablet 2   glimepiride  (AMARYL ) 2 MG tablet Take 1 tablet (2 mg total) by mouth daily. 30 tablet 2   hydrALAZINE  (APRESOLINE ) 100 MG tablet Take 1 tablet (100 mg total) by mouth with breakfast, with lunch, and with evening meal. 90 tablet 2   Insulin  Glargine (BASAGLAR  KWIKPEN) 100 UNIT/ML Inject 35 Units into the skin daily. 9 mL 0   insulin  lispro (HUMALOG  KWIKPEN) 100 UNIT/ML KwikPen Inject 5 Units into the skin 3 (three) times daily. 3 mL 0   isosorbide  mononitrate (IMDUR ) 120 MG 24 hr tablet Take 1 tablet (120 mg total) by mouth daily. 60 tablet 0   torsemide  (DEMADEX ) 20 MG tablet Take 4 tablets (80 mg total) by mouth daily. 120 tablet 3   Fingerstix Lancets MISC Use as directed up to four times daily. 100 each 0   glucose blood (TRUE METRIX PRO BLOOD GLUCOSE) test strip Use up to four times daily as directed. (FOR ICD-10 E10.9, E11.9). 100 each 0   Insulin  Pen Needle (PEN NEEDLES) 32G X 4 MM MISC Use on Insulin  pens 100 each 0   Lancet Device MISC Use as directed up to 4 times daily. 1 each 0   potassium chloride  SA (KLOR-CON  M) 20 MEQ tablet Take 2 tablets (40 mEq total) by mouth every morning AND 1 tablet (20 mEq total) every evening. (Patient not taking: No sig reported) 90 tablet 3   rivaroxaban  (XARELTO ) 20 MG TABS tablet Take 20 mg by mouth daily with supper. (Patient not taking: Reported on 09/23/2024)     No current facility-administered medications on file prior to visit.   "

## 2024-09-23 NOTE — Progress Notes (Unsigned)
 Needs refills on medications.  Has not taken several medications in a several days because he ran out.   Patient reports he drinks 4, 16 oz water bottles daily. He admits he consumes sodium in diet. Encouraged patient to eat more vegetables.  He may have occasionally beer.  He has had to sleep more so in an upright position.     BLE swelling  09/23/2024- A1C - 13.9

## 2024-09-23 NOTE — Patient Instructions (Signed)
Pick up medication from pharmacy.

## 2024-09-24 ENCOUNTER — Telehealth: Payer: Self-pay | Admitting: *Deleted

## 2024-09-24 ENCOUNTER — Encounter: Payer: Self-pay | Admitting: *Deleted

## 2024-09-24 ENCOUNTER — Other Ambulatory Visit: Payer: Self-pay

## 2024-09-24 ENCOUNTER — Telehealth (HOSPITAL_COMMUNITY): Payer: Self-pay

## 2024-09-24 NOTE — Progress Notes (Incomplete)
 "  Advanced Heart Failure Clinic Note    PCP: Justin Rosaline SQUIBB, NP PCP-Cardiologist: Justin Bruckner, MD  AHFC: Dr. Rolan   HPI: [name] with history of chronic systolic CHF d/t NICM (prior cardiac MRI concerning for possible sarcoid but pt never completed f/u PET), uncontrolled HTN, poorly controlled DM II, mitral regurgitation, ETOH abuse, hx cocaine use, Afib/AFL and poor compliance.  Admitted 12/25 w/ a/c CHF w/ marked volume overload and low output and was back in AFL w/ RVR on admission. Initial co-ox 47%. Started on DBA + amio gtts. Respiratory panel + for rhinovirus. Diuresed w/ IV Lasix  then underwent TEE guided DCCV. TEE EF 20-25%, RV mod reduced, no LA thrombus. Had successful cardioversion back to NSR. He was transitioned to PO amiodarone  and weaned off DBA w/ stable co-ox. Transitioned to PO torsemide  and GDMT, though limited by CKD. He was not placed on an SGLT2i given poorly controlled DM, Hgb A1c 13. Discharge wt 267 lb.   Here today for CHF follow-up.  He presents today for post hospital f/u. Here w/ interpreter. Says he has been doing better. Breathing overall improved. No resting dyspnea. Reports improved NYHA Class II symptoms but c/w orthopnea, sleeps w/ 2 pillows. No PND. He has noticed about at 5 lb wt gain on his home scale and has mild LEE on exam. His wt in clinic today is up to 285 lb. EKG shows NSR. He reports compliance w/ all meds except Xarelto . He says he was not given supply/samples at hospital d/c. Application for patient assistance pending. He reports compliance w/ Torsemide  but says UOP is not robust.   Past Medical History:  Diagnosis Date   Heart failure with reduced ejection fraction [facility]) 2017   [facility]   Hypertension 2017   [facility]    Current Outpatient Medications  Medication Sig Dispense Refill   amiodarone  (PACERONE ) 200 MG tablet Take 1 tablet (200mg ) by mouth twice a day for 2 weeks, and  then 1 tablet (200mg ) daily. 60 tablet 1   amLODipine  (NORVASC ) 10 MG tablet Take 1 tablet (10 mg total) by mouth daily. 60 tablet 0   apixaban (ELIQUIS) 5 MG TABS tablet Take 5 mg by mouth 2 (two) times daily.     atorvastatin  (LIPITOR) 20 MG tablet Take 1 tablet (20 mg total) by mouth daily. 30 tablet 2   Fingerstix Lancets MISC Use as directed up to four times daily. 100 each 0   glimepiride  (AMARYL ) 2 MG tablet Take 1 tablet (2 mg total) by mouth daily. 30 tablet 2   glucose blood (TRUE METRIX PRO BLOOD GLUCOSE) test strip Use up to four times daily as directed. (FOR ICD-10 E10.9, E11.9). 100 each 0   hydrALAZINE  (APRESOLINE ) 100 MG tablet Take 1 tablet (100 mg total) by mouth with breakfast, with lunch, and with evening meal. 90 tablet 2   Insulin  Glargine (BASAGLAR  KWIKPEN) 100 UNIT/ML Inject 35 Units into the skin daily. 9 mL 0   insulin  lispro (HUMALOG  KWIKPEN) 100 UNIT/ML KwikPen Inject 5 Units into the skin 3 (three) times daily. 3 mL 0   Insulin  Pen Needle (PEN NEEDLES) 32G X 4 MM MISC Use on Insulin  pens 100 each 0   isosorbide  mononitrate (IMDUR ) 120 MG 24 hr tablet Take 1 tablet (120 mg total) by mouth daily. 60 tablet 0   Lancet Device MISC Use as directed up to 4 times daily. 1 each 0   potassium chloride  SA (KLOR-CON  M)  20 MEQ tablet Take 2 tablets (40 mEq total) by mouth every morning AND 1 tablet (20 mEq total) every evening. (Patient not taking: No sig reported) 90 tablet 3   rivaroxaban  (XARELTO ) 20 MG TABS tablet Take 20 mg by mouth daily with supper. (Patient not taking: Reported on 09/23/2024)     torsemide  (DEMADEX ) 20 MG tablet Take 4 tablets (80 mg total) by mouth daily. 120 tablet 3   No current facility-administered medications for this visit.    Allergies[1]    Social History   Socioeconomic History   Marital status: Single    Spouse name: Not on file   Number of children: 3   Years of education: Not on file   Highest education level: 7th grade   Occupational History   Occupation: education administrator    Comment: varies  Tobacco Use   Smoking status: Never   Smokeless tobacco: Never  Vaping Use   Vaping status: Never Used  Substance and Sexual Activity   Alcohol use: Yes    Alcohol/week: 30.0 standard drinks of alcohol    Types: 30 Cans of beer per week    Comment: occ   Drug use: Not Currently    Types: Crack cocaine, Cocaine    Comment: 2 weeks   Sexual activity: Not on file  Other Topics Concern   Not on file  Social History Narrative   Not on file   Social Drivers of Health   Tobacco Use: Low Risk (09/23/2024)   Patient History    Smoking Tobacco Use: Never    Smokeless Tobacco Use: Never    Passive Exposure: Not on file  Financial Resource Strain: Not at Risk (05/07/2024)   Received from [employer]    How hard is it for you to pay for the very basics like food, housing, heating, medical care, and medications?: 1  Food Insecurity: No Food Insecurity (08/21/2024)   Epic    Worried About Programme Researcher, Broadcasting/film/video in the Last Year: Never true    Ran Out of Food in the Last Year: Never true  Recent Concern: Food Insecurity - Food Insecurity Present (08/11/2024)   Epic    Worried About Programme Researcher, Broadcasting/film/video in the Last Year: Sometimes true    Ran Out of Food in the Last Year: Sometimes true  Transportation Needs: No Transportation Needs (08/21/2024)   Epic    Lack of Transportation (Medical): No    Lack of Transportation (Non-Medical): No  Physical Activity: At Risk (05/07/2024)   Received from [facility]   Physical Activity    On average, how many minutes do you engage in exercise at this level?: 2  Stress: Not at Risk (05/07/2024)   Received from [facility]   Stress    Do you feel these kinds of stress these days?: 1  Social Connections: Not at Risk (05/07/2024)   Received from [facility]   Social Connections    How often do you see or talk to people that you care about and feel close to? (For example: talking to  friends on phone, visiting friends or family, going to church or club meetings): 1  Intimate Partner Violence: Not At Risk (08/21/2024)   Epic    Fear of Current or Ex-Partner: No    Emotionally Abused: No    Physically Abused: No    Sexually Abused: No  Depression (PHQ2-9): Low Risk (08/21/2024)   Depression (PHQ2-9)    PHQ-2 Score: 0  Alcohol Screen: Medium  Risk (02/20/2022)   Alcohol Screen    Last Alcohol Screening Score (AUDIT): 12  Housing: High Risk (08/25/2024)   Epic    Unable to Pay for Housing in the Last Year: Yes    Number of Times Moved in the Last Year: Not on file    Homeless in the Last Year: No  Utilities: Not At Risk (08/21/2024)   Epic    Threatened with loss of utilities: No  Health Literacy: Inadequate Health Literacy (09/21/2023)   B1300 Health Literacy    Frequency of need for help with medical instructions: Often      Family History  Problem Relation Age of Onset   Hypertension Father     There were no vitals filed for this visit.     PHYSICAL EXAM: General:  Well appearing, obese. No respiratory difficulty HEENT: normal Neck: supple. JVD 8-9 cm  Cor: PMI nondisplaced. Regular rate & rhythm. No rubs, gallops or murmurs. Lungs: clear Abdomen: soft, nontender, nondistended. No hepatosplenomegaly. No bruits or masses. Good bowel sounds. Extremities: no cyanosis, clubbing, rash, 1+ b/l pretibial edema Neuro: alert & oriented x 3, cranial nerves grossly intact. moves all 4 extremities w/o difficulty. Affect pleasant.  ECG:    ASSESSMENT & PLAN:  1. Chronic systolic CHF: Long history of nonischemic cardiomyopathy. Cath in 6/23 with no significant CAD, CI 1.99.  Substance abuse (cocaine and ETOH) thought to play a role in cardiomyopathy, also uncontrolled HTN in the past.  However, cardiac MRI was done in 6/23 showing LVEF 29%, LV severely dilated, asymmetric LVH with basal septum measuring 17 mm, RVEF 45%, patchy LGE in septum and RV insertion  sites + subepicardial LGE in inferolateral wall. Scar pattern and asymmetric hypertrophy could be consistent with hypertrophic cardiomyopathy. However, due to subepicardial LGE in inferolateral wall and presence of mediastinal lymphadenopathy, cardiac PET recommended to rule out cardiac sarcoidosis.  This was never done due to lack of insurance.  Echo in 7/25 showed EF 20-25% with moderate concentric LVH, severe RV dysfunction, probably severe functional MR with PISA ERO 0.47 cm^2, IVC dilated.  Low output HF noted at 7/25 admission, he was started on milrinone  and diuresed, milrinone  eventually discontinued.  Recent admit 12/25 w/ a/c CHF w/ low output, in setting of recurrent AFL w/ RVR. Initial Co-ox 47%. Started on DBA and diuresed w/ IV Lasix . TEE 12/4 showed EF 20-25%, LV severely dilated, mod LVH, RV mod reduced, LA/RA severely dilated, mod MR. After diuresis, was weaned off of DBA w/ stable co-ox. Today, reports improved NYHA Class II symptoms. He has mild volume overload on exam and wt is trending back up. He reports compliance w/ torsemide  but UOP is not robust.  - Increase torsemide  to 80 mg daily and increase KCl to 40 mEq qam/ 20 mEq qpm. BMP/BNP today and again in 7 days  - continue hydralazine  100 mg TID + imdur  120 mg daily - off ARB/ARN/Spiro w/ elevated SCr - no SGLT2i given poor glycemic control, Hgb A1c 13.  - No ? blocker yet w/ recent w/ low output  - Would like to get a cardiac PET to rule out cardiac sarcoidosis, but this is probably not an option with no insurance. - Not a candidate for advanced therapies with noncompliance, lack of insurance, RV dysfunction and CKD stage 4.   2. Atrial fibrillation/atypical flutter: Underwent TEE-guided DCCV back to NSR 7/25 but was back in [facility] 12/25 admission. S/p repeat TEE/DC-CV 08/14/24. Maintaining NSR on today's EKG - continue amiodarone   200 mg daily  - he cannot yet afford Xarelto  (waiting pt assistance application approval and we do not  have samples). Will switch to Eliquis for now. Will provide samples given recent DCCV. 5 mg bid  - suspect has OSA, will be unable to perform sleep study w/o insurance  3. CKD stage IV: Suspect cardiorenal syndrome + DM2 + HTN.  Last creatinine 2.5  - check BMP today  - no SGLT2i yet w/ poorly controlled DM    4. Mitral regurgitation: Severe functional MR.  TEE 7/25 showed severe eccentric, posteriorly-directed MR with restricted posterior leaflet, MR ERO 0.6 cm^2 by PISA.  - continue HF/AFL optimization per above    5. ETOH/cocaine abuse: Says he has quit. UDS negative on 12/25 admission    6. DM2: Poor control.   - hgb a1c 13.9% - insulin  + glimepiride  - f/u w/ PCP     F/u   Justin Frith N, PA-C 09/24/2024      [1] No Known Allergies  "

## 2024-09-24 NOTE — Telephone Encounter (Signed)
 Called and spoke to pt's sister to confirm/remind patient of their appointment at the Advanced [facility] Failure Clinic on 09/25/24. However, was unable to reach patient.

## 2024-09-25 ENCOUNTER — Encounter: Payer: Self-pay | Admitting: *Deleted

## 2024-09-25 ENCOUNTER — Ambulatory Visit (HOSPITAL_COMMUNITY): Payer: Self-pay

## 2024-09-26 ENCOUNTER — Telehealth: Payer: Self-pay | Admitting: *Deleted

## 2024-09-26 DIAGNOSIS — I5032 Chronic diastolic (congestive) heart failure: Secondary | ICD-10-CM

## 2024-09-26 NOTE — Patient Instructions (Signed)
 Visit Information  Thank you for taking time to visit with me today. Please don't hesitate to contact me if I can be of assistance to you before our next scheduled telephone appointment.   Following is a copy of your care plan:   Goals Addressed             This Visit's Progress    COMPLETED: VBCI Transitions of Care (TOC) Care Plan       Problems:  Recent Hospitalization for treatment of CHF and CAP Knowledge Deficit Related to CHF management and Medication access barrier due to no insurance and needing refills for prescribed medications  Goal:  Over the next 30 days, the patient will not experience hospital readmission  Interventions:  Transitions of Care: Doctor Visits  - discussed the importance of doctor visits Referral to Longitudinal Nurse Case Manager for Ongoing follow-up Reviewed diet, exercise, daily weights Medication review, advised patient to pick up Torsemide  and Potassium prescriptions today and take as directed Utilized Spanish interpreter [phone] via Directv on daily weights and encouraged compliance Advised patient to purchase a BP monitor and start checking BP daily-revisited Reviewed upcoming appointments including:Diabetic Education on 10/02/24  Patient Self Care Activities:  Attend all scheduled provider appointments Call pharmacy for medication refills 3-7 days in advance of running out of medications Call provider office for new concerns or questions  Notify RN Care Manager of TOC call rescheduling needs Participate in Transition of Care Program/Attend TOC scheduled calls Take medications as prescribed   Work with the social worker to address care coordination needs and will continue to work with the clinical team to address health care and disease management related needs call office if I gain more than 2 pounds in one day or 5 pounds in one week use salt in moderation watch for swelling in feet, ankles and legs every day eat  more whole grains, fruits and vegetables, lean meats and healthy fats  Plan:  The care management team will reach out to the patient again over the next 30 days.        Patient verbalizes understanding of instructions and care plan provided today and agrees to view in MyChart. Active MyChart status and patient understanding of how to access instructions and care plan via MyChart confirmed with patient.     The care management team will reach out to the patient again over the next 30 days.   Please call the care guide team at [phone] if you need to cancel or reschedule your appointment.   Please call [phone]-TALK (toll free, 24 hour hotline) go to [facility] Urgent [facility] [address], [place] [phone]) call 911 if you are experiencing a Mental Health or Behavioral Health Crisis or need someone to talk to.  Andrea Dimes RN, BSN [place]  Value-Based [facility] Health RN Care Manager [phone]

## 2024-09-26 NOTE — Transitions of Care (Post Inpatient/ED Visit) (Signed)
 " Transition of Care week 5  Visit Note  09/26/2024  Name: Justin [name] MRN: [id number]          DOB: 01/11/1981  Situation: Patient enrolled in [facility] 30-day program. Visit completed with Justin [name] by telephone.   Background:   Initial Transition Care Management Follow-up Telephone Call Discharge Date and Diagnosis: No data recorded   Past Medical History:  Diagnosis Date   Heart failure with reduced ejection fraction [facility]) 2017   [facility]   Hypertension 2017   [facility]    Assessment: Patient Reported Symptoms: Cognitive Cognitive Status: Able to follow simple commands, Alert and oriented to person, place, and time, Normal speech and language skills (Spanish interpreter utilized)      Neurological Neurological Review of Symptoms: Headaches Neurological Management Strategies: Adequate rest, Coping strategies, Routine screening Neurological Self-Management Outcome: 3 (uncertain) Neurological Comment: Denies headache today.  HEENT HEENT Symptoms Reported: Not assessed      Cardiovascular Cardiovascular Symptoms Reported: Swelling in legs or feet Cardiovascular Management Strategies: Adequate rest, Coping strategies, Routine screening, Medication therapy Cardiovascular Self-Management Outcome: 3 (uncertain) Cardiovascular Comment: Advised patient to pick up Torsemide  from the pharmacy and take as instructed. Reviewed and advised daily weights, daily BP checks. Advised rescheduling missed appointment with HF clinic.  Respiratory Respiratory Symptoms Reported: No symptoms reported    Endocrine Endocrine Symptoms Reported: No symptoms reported Is patient diabetic?: Yes Is patient checking blood sugars at home?: No List most recent blood sugar readings, include date and time of day: Patient denies checking BS in one week Endocrine Self-Management Outcome: 3 (uncertain) Endocrine Comment: Advised to check BS as directed, follow up  with PCP and attend diabetic education on 10/02/24  Gastrointestinal Gastrointestinal Symptoms Reported: No symptoms reported      Genitourinary Genitourinary Symptoms Reported: No symptoms reported    Integumentary Integumentary Symptoms Reported: No symptoms reported    Musculoskeletal Musculoskelatal Symptoms Reviewed: No symptoms reported        Psychosocial Psychosocial Symptoms Reported: No symptoms reported         There were no vitals filed for this visit. Pain Score: 0-No pain  Medications Reviewed Today     Reviewed by Lucky Andrea LABOR, RN (Registered Nurse) on 09/26/24 at 1159  Med List Status: <None>   Medication Order Taking? Sig Documenting Provider Last Dose Status Informant  amiodarone  (PACERONE ) 200 MG tablet [id number] Yes Take 1 tablet (200mg ) by mouth twice a day for 2 weeks, and then 1 tablet (200mg ) daily. Fairy Frames, MD  Active   amLODipine  (NORVASC ) 10 MG tablet [id number] Yes Take 1 tablet (10 mg total) by mouth daily. Fairy Frames, MD  Active   apixaban (ELIQUIS) 5 MG TABS tablet [id number]  Take 5 mg by mouth 2 (two) times daily.  Patient not taking: Reported on 09/26/2024   [provider]  Active   atorvastatin  (LIPITOR) 20 MG tablet [id number] Yes Take 1 tablet (20 mg total) by mouth daily. Newlin, Enobong, MD  Active   Fingerstix Lancets MISC [id number] Yes Use as directed up to four times daily. Fairy Frames, MD  Active   glimepiride  (AMARYL ) 2 MG tablet [id number] Yes Take 1 tablet (2 mg total) by mouth daily. Newlin, Enobong, MD  Active   glucose blood (TRUE METRIX PRO BLOOD GLUCOSE) test strip [id number] Yes Use up to four times daily as directed. (FOR ICD-10 E10.9, E11.9). Fairy Frames, MD  Active   hydrALAZINE  (APRESOLINE ) 100 MG tablet [id number]  Yes Take 1 tablet (100 mg total) by mouth with breakfast, with lunch, and with evening meal. Newlin, Enobong, MD  Active   Insulin  Glargine (BASAGLAR  KWIKPEN) 100 UNIT/ML [id number] Yes  Inject 35 Units into the skin daily. Newlin, Enobong, MD  Active   insulin  lispro (HUMALOG  KWIKPEN) 100 UNIT/ML KwikPen [id number] Yes Inject 5 Units into the skin 3 (three) times daily. Newlin, Enobong, MD  Active   Insulin  Pen Needle (PEN NEEDLES) 32G X 4 MM MISC [id number] Yes Use on Insulin  pens Fairy Frames, MD  Active   isosorbide  mononitrate (IMDUR ) 120 MG 24 hr tablet [id number] Yes Take 1 tablet (120 mg total) by mouth daily. Fairy Frames, MD  Active   Lancet Device MISC [id number] Yes Use as directed up to 4 times daily. Fairy Frames, MD  Active   potassium chloride  SA (KLOR-CON  M) 20 MEQ tablet [id number]  Take 2 tablets (40 mEq total) by mouth every morning AND 1 tablet (20 mEq total) every evening.  Patient not taking: No sig reported   Marcine Caffie HERO, PA-C  Active   rivaroxaban  (XARELTO ) 20 MG TABS tablet [id number] Yes Take 20 mg by mouth daily with supper. [provider]  Active   torsemide  (DEMADEX ) 20 MG tablet [id number]  Take 4 tablets (80 mg total) by mouth daily.  Patient not taking: Reported on 09/26/2024   Marcine Caffie HERO, PA-C  Active             Recommendation:   Continue Current Plan of Care  Follow Up Plan:   Referral to RN Case Manager Closing From:  Transitions of Care Program  Andrea Dimes RN, BSN [place]  Value-Based Care Institute [facility] Health RN Care Manager [phone]     "

## 2024-09-29 ENCOUNTER — Inpatient Hospital Stay (HOSPITAL_COMMUNITY)
Admission: EM | Admit: 2024-09-29 | Discharge: 2024-10-04 | DRG: 291 | Disposition: A | Discharge status: Home / Self Care | Payer: Self-pay | Attending: Internal Medicine | Admitting: Internal Medicine

## 2024-09-29 ENCOUNTER — Encounter (HOSPITAL_COMMUNITY): Payer: Self-pay

## 2024-09-29 ENCOUNTER — Emergency Department (HOSPITAL_COMMUNITY): Payer: Self-pay

## 2024-09-29 ENCOUNTER — Other Ambulatory Visit: Payer: Self-pay

## 2024-09-29 DIAGNOSIS — J9601 Acute respiratory failure with hypoxia: Secondary | ICD-10-CM | POA: Diagnosis present

## 2024-09-29 DIAGNOSIS — Z87898 Personal history of other specified conditions: Secondary | ICD-10-CM

## 2024-09-29 DIAGNOSIS — Z8249 Family history of ischemic heart disease and other diseases of the circulatory system: Secondary | ICD-10-CM

## 2024-09-29 DIAGNOSIS — Z79899 Other long term (current) drug therapy: Secondary | ICD-10-CM

## 2024-09-29 DIAGNOSIS — I480 Paroxysmal atrial fibrillation: Secondary | ICD-10-CM | POA: Diagnosis present

## 2024-09-29 DIAGNOSIS — I130 Hypertensive heart and chronic kidney disease with heart failure and stage 1 through stage 4 chronic kidney disease, or unspecified chronic kidney disease: Principal | ICD-10-CM | POA: Diagnosis present

## 2024-09-29 DIAGNOSIS — Z794 Long term (current) use of insulin: Secondary | ICD-10-CM

## 2024-09-29 DIAGNOSIS — I10 Essential (primary) hypertension: Secondary | ICD-10-CM | POA: Diagnosis present

## 2024-09-29 DIAGNOSIS — N184 Chronic kidney disease, stage 4 (severe): Secondary | ICD-10-CM | POA: Diagnosis present

## 2024-09-29 DIAGNOSIS — I428 Other cardiomyopathies: Secondary | ICD-10-CM | POA: Diagnosis present

## 2024-09-29 DIAGNOSIS — I5023 Acute on chronic systolic (congestive) heart failure: Secondary | ICD-10-CM | POA: Diagnosis present

## 2024-09-29 DIAGNOSIS — Z6841 Body Mass Index (BMI) 40.0 and over, adult: Secondary | ICD-10-CM

## 2024-09-29 DIAGNOSIS — N1832 Chronic kidney disease, stage 3b: Secondary | ICD-10-CM | POA: Diagnosis present

## 2024-09-29 DIAGNOSIS — E7849 Other hyperlipidemia: Secondary | ICD-10-CM | POA: Diagnosis present

## 2024-09-29 DIAGNOSIS — E1169 Type 2 diabetes mellitus with other specified complication: Secondary | ICD-10-CM | POA: Diagnosis present

## 2024-09-29 DIAGNOSIS — E1165 Type 2 diabetes mellitus with hyperglycemia: Secondary | ICD-10-CM | POA: Diagnosis present

## 2024-09-29 DIAGNOSIS — J189 Pneumonia, unspecified organism: Secondary | ICD-10-CM

## 2024-09-29 DIAGNOSIS — E876 Hypokalemia: Secondary | ICD-10-CM | POA: Diagnosis not present

## 2024-09-29 DIAGNOSIS — I4510 Unspecified right bundle-branch block: Secondary | ICD-10-CM | POA: Diagnosis present

## 2024-09-29 DIAGNOSIS — N179 Acute kidney failure, unspecified: Secondary | ICD-10-CM | POA: Diagnosis not present

## 2024-09-29 DIAGNOSIS — Z555 Less than a high school diploma: Secondary | ICD-10-CM

## 2024-09-29 DIAGNOSIS — I509 Heart failure, unspecified: Principal | ICD-10-CM

## 2024-09-29 DIAGNOSIS — I081 Rheumatic disorders of both mitral and tricuspid valves: Secondary | ICD-10-CM | POA: Diagnosis present

## 2024-09-29 DIAGNOSIS — Z7984 Long term (current) use of oral hypoglycemic drugs: Secondary | ICD-10-CM

## 2024-09-29 DIAGNOSIS — I4819 Other persistent atrial fibrillation: Secondary | ICD-10-CM | POA: Diagnosis present

## 2024-09-29 DIAGNOSIS — Z7901 Long term (current) use of anticoagulants: Secondary | ICD-10-CM

## 2024-09-29 DIAGNOSIS — E66813 Obesity, class 3: Secondary | ICD-10-CM | POA: Diagnosis present

## 2024-09-29 DIAGNOSIS — E1122 Type 2 diabetes mellitus with diabetic chronic kidney disease: Secondary | ICD-10-CM | POA: Diagnosis present

## 2024-09-29 LAB — BLOOD GAS, VENOUS
Acid-Base Excess: 4.6 mmol/L — ABNORMAL HIGH (ref 0.0–2.0)
Bicarbonate: 31.6 mmol/L — ABNORMAL HIGH (ref 20.0–28.0)
O2 Saturation: 100 %
Patient temperature: 37
pCO2, Ven: 56 mmHg (ref 44–60)
pH, Ven: 7.36 (ref 7.25–7.43)
pO2, Ven: 162 mmHg — ABNORMAL HIGH (ref 32–45)

## 2024-09-29 LAB — BASIC METABOLIC PANEL WITH GFR
Anion gap: 10 (ref 5–15)
BUN: 26 mg/dL — ABNORMAL HIGH (ref 6–20)
CO2: 26 mmol/L (ref 22–32)
Calcium: 9 mg/dL (ref 8.9–10.3)
Chloride: 102 mmol/L (ref 98–111)
Creatinine, Ser: 1.85 mg/dL — ABNORMAL HIGH (ref 0.61–1.24)
GFR, Estimated: 45 mL/min — ABNORMAL LOW
Glucose, Bld: 123 mg/dL — ABNORMAL HIGH (ref 70–99)
Potassium: 4 mmol/L (ref 3.5–5.1)
Sodium: 138 mmol/L (ref 135–145)

## 2024-09-29 LAB — CBC
HCT: 48.7 % (ref 39.0–52.0)
Hemoglobin: 15.2 g/dL (ref 13.0–17.0)
MCH: 29.7 pg (ref 26.0–34.0)
MCHC: 31.2 g/dL (ref 30.0–36.0)
MCV: 95.3 fL (ref 80.0–100.0)
Platelets: 242 K/uL (ref 150–400)
RBC: 5.11 MIL/uL (ref 4.22–5.81)
RDW: 14.4 % (ref 11.5–15.5)
WBC: 11.5 K/uL — ABNORMAL HIGH (ref 4.0–10.5)
nRBC: 0 % (ref 0.0–0.2)

## 2024-09-29 LAB — PROCALCITONIN: Procalcitonin: 0.19 ng/mL

## 2024-09-29 LAB — PRO BRAIN NATRIURETIC PEPTIDE: Pro Brain Natriuretic Peptide: 1408 pg/mL — ABNORMAL HIGH

## 2024-09-29 LAB — CBG MONITORING, ED
Glucose-Capillary: 116 mg/dL — ABNORMAL HIGH (ref 70–99)
Glucose-Capillary: 87 mg/dL (ref 70–99)
Glucose-Capillary: 69 mg/dL — ABNORMAL LOW (ref 70–99)
Glucose-Capillary: 112 mg/dL — ABNORMAL HIGH (ref 70–99)

## 2024-09-29 LAB — GLUCOSE, CAPILLARY: Glucose-Capillary: 132 mg/dL — ABNORMAL HIGH (ref 70–99)

## 2024-09-29 LAB — HIV ANTIBODY (ROUTINE TESTING W REFLEX): HIV Screen 4th Generation wRfx: NONREACTIVE

## 2024-09-29 MED ORDER — FUROSEMIDE 10 MG/ML IJ SOLN
80.0000 mg | Freq: Once | INTRAMUSCULAR | Status: AC
  Administered 2024-09-29: 80 mg via INTRAVENOUS
  Filled 2024-09-29: qty 8

## 2024-09-29 MED ORDER — BASAGLAR KWIKPEN 100 UNIT/ML ~~LOC~~ SOPN: 35.0000 [IU] | PEN_INJECTOR | Freq: Every day | SUBCUTANEOUS | Status: DC

## 2024-09-29 MED ORDER — INSULIN ASPART 100 UNIT/ML IJ SOLN
0.0000 [IU] | Freq: Three times a day (TID) | INTRAMUSCULAR | Status: DC
  Administered 2024-09-30: 3 [IU] via SUBCUTANEOUS
  Administered 2024-10-01: 2 [IU] via SUBCUTANEOUS
  Administered 2024-10-01 – 2024-10-02 (×2): 3 [IU] via SUBCUTANEOUS
  Administered 2024-10-02 – 2024-10-04 (×5): 2 [IU] via SUBCUTANEOUS
  Filled 2024-09-29 (×2): qty 2
  Filled 2024-09-29: qty 3
  Filled 2024-09-29 (×4): qty 2
  Filled 2024-09-29 (×2): qty 3

## 2024-09-29 MED ORDER — SODIUM CHLORIDE 0.9 % IV SOLN
1.0000 g | Freq: Once | INTRAVENOUS | Status: AC
  Administered 2024-09-29: 1 g via INTRAVENOUS
  Filled 2024-09-29: qty 10

## 2024-09-29 MED ORDER — RIVAROXABAN 20 MG PO TABS
20.0000 mg | ORAL_TABLET | Freq: Every day | ORAL | Status: DC
  Administered 2024-09-29 – 2024-10-03 (×5): 20 mg via ORAL
  Filled 2024-09-29: qty 1
  Filled 2024-09-29: qty 2
  Filled 2024-09-29 (×3): qty 1

## 2024-09-29 MED ORDER — AZITHROMYCIN 250 MG PO TABS
500.0000 mg | ORAL_TABLET | Freq: Once | ORAL | Status: AC
  Administered 2024-09-29: 500 mg via ORAL
  Filled 2024-09-29: qty 2

## 2024-09-29 MED ORDER — ATORVASTATIN CALCIUM 10 MG PO TABS
20.0000 mg | ORAL_TABLET | Freq: Every day | ORAL | Status: DC
  Administered 2024-09-29 – 2024-10-04 (×6): 20 mg via ORAL
  Filled 2024-09-29 (×6): qty 2

## 2024-09-29 MED ORDER — ENOXAPARIN SODIUM 40 MG/0.4ML IJ SOSY: 40.0000 mg | PREFILLED_SYRINGE | INTRAMUSCULAR | Status: DC

## 2024-09-29 MED ORDER — AMIODARONE HCL 200 MG PO TABS
200.0000 mg | ORAL_TABLET | Freq: Every day | ORAL | Status: DC
  Administered 2024-09-29 – 2024-10-03 (×5): 200 mg via ORAL
  Filled 2024-09-29 (×5): qty 1

## 2024-09-29 MED ORDER — AMLODIPINE BESYLATE 10 MG PO TABS
10.0000 mg | ORAL_TABLET | Freq: Every day | ORAL | Status: DC
  Administered 2024-09-29 – 2024-10-04 (×6): 10 mg via ORAL
  Filled 2024-09-29: qty 1
  Filled 2024-09-29: qty 2
  Filled 2024-09-29 (×5): qty 1

## 2024-09-29 MED ORDER — FUROSEMIDE 10 MG/ML IJ SOLN
80.0000 mg | Freq: Two times a day (BID) | INTRAMUSCULAR | Status: DC
  Administered 2024-09-29 – 2024-09-30 (×2): 80 mg via INTRAVENOUS
  Filled 2024-09-29 (×2): qty 8

## 2024-09-29 MED ORDER — INSULIN GLARGINE-YFGN 100 UNIT/ML ~~LOC~~ SOLN
35.0000 [IU] | Freq: Every day | SUBCUTANEOUS | Status: DC
  Administered 2024-09-29: 35 [IU] via SUBCUTANEOUS
  Filled 2024-09-29 (×2): qty 0.35

## 2024-09-29 NOTE — ED Provider Notes (Signed)
 " [place] [facility] Provider Note   CSN: [id number] Arrival date & time: 09/29/24  9657     Patient presents with: Shortness of Breath   Cleon Signorelli is a 44 y.o. male with history of polysubstance abuse, nonischemic cardiomyopathy, chronic diastolic heart failure, hypertension, respiratory failure with hypoxia, acute systolic CHF, type 2 diabetes, stage III CKD, alcohol abuse.  Patient presents to ED for shortness of breath and leg swelling.  Reports that he noticed 3 to 4 days ago he had bilateral lower extremity edema and then 2 days ago noticed that he was short of breath on ambulation.  Denies any chest pain.  Reports he has not had his fluid pills since this past Wednesday as he states that his pharmacy just did not give them to him but he cannot elaborate further.  He denies any nausea, vomiting or abdominal pain.  Denies any fevers at home, dysuria, flank pain.   Shortness of Breath      Prior to Admission medications  Medication Sig Start Date End Date Taking? Authorizing Provider  amiodarone  (PACERONE ) 200 MG tablet Take 1 tablet (200mg ) by mouth twice a day for 2 weeks, and then 1 tablet (200mg ) daily. 08/20/24  Yes Fairy Frames, MD  amLODipine  (NORVASC ) 10 MG tablet Take 1 tablet (10 mg total) by mouth daily. 08/21/24  Yes Fairy Frames, MD  atorvastatin  (LIPITOR) 20 MG tablet Take 1 tablet (20 mg total) by mouth daily. 08/21/24  Yes Newlin, Enobong, MD  glimepiride  (AMARYL ) 2 MG tablet Take 1 tablet (2 mg total) by mouth daily. 08/21/24  Yes Newlin, Enobong, MD  hydrALAZINE  (APRESOLINE ) 100 MG tablet Take 1 tablet (100 mg total) by mouth with breakfast, with lunch, and with evening meal. 08/21/24  Yes Newlin, Enobong, MD  Insulin  Glargine (BASAGLAR  KWIKPEN) 100 UNIT/ML Inject 35 Units into the skin daily. 09/11/24  Yes Newlin, Enobong, MD  insulin  lispro (HUMALOG  KWIKPEN) 100 UNIT/ML KwikPen Inject 5 Units into the skin  3 (three) times daily. 09/11/24  Yes Newlin, Enobong, MD  isosorbide  mononitrate (IMDUR ) 120 MG 24 hr tablet Take 1 tablet (120 mg total) by mouth daily. 08/20/24  Yes Fairy Frames, MD  potassium chloride  SA (KLOR-CON  M) 20 MEQ tablet Take 2 tablets (40 mEq total) by mouth every morning AND 1 tablet (20 mEq total) every evening. 09/01/24  Yes Marcine Catalan M, PA-C  rivaroxaban  (XARELTO ) 20 MG TABS tablet Take 20 mg by mouth daily with supper.   Yes [provider]  torsemide  (DEMADEX ) 20 MG tablet Take 4 tablets (80 mg total) by mouth daily. 09/01/24  Yes Marcine Catalan M, PA-C  apixaban (ELIQUIS) 5 MG TABS tablet Take 5 mg by mouth 2 (two) times daily. Patient not taking: Reported on 09/26/2024    [provider]  Fingerstix Lancets MISC Use as directed up to four times daily. 08/20/24   Fairy Frames, MD  glucose blood (TRUE METRIX PRO BLOOD GLUCOSE) test strip Use up to four times daily as directed. (FOR ICD-10 E10.9, E11.9). 08/20/24   Fairy Frames, MD  Insulin  Pen Needle (PEN NEEDLES) 32G X 4 MM MISC Use on Insulin  pens 08/20/24   Fairy Frames, MD  Lancet Device MISC Use as directed up to 4 times daily. 08/20/24   Fairy Frames, MD    Allergies: Patient has no known allergies.    Review of Systems  Respiratory:  Positive for shortness of breath.   Cardiovascular:  Positive for leg  swelling.    Updated Vital Signs BP 139/85 (BP Location: Right Arm)   Pulse 94   Temp 97.9 F (36.6 C) (Oral)   Resp 17   Ht 5' 6 (1.676 m)   Wt (!) 136.5 kg   SpO2 97%   BMI 48.58 kg/m   Physical Exam Vitals and nursing note reviewed.  Constitutional:      General: He is not in acute distress.    Appearance: He is well-developed.  HENT:     Head: Normocephalic and atraumatic.  Eyes:     Conjunctiva/sclera: Conjunctivae normal.  Cardiovascular:     Rate and Rhythm: Normal rate and regular rhythm.     Heart sounds: No murmur heard. Pulmonary:     Effort:  Pulmonary effort is normal. No respiratory distress.     Breath sounds: Rales present.  Abdominal:     Palpations: Abdomen is soft.     Tenderness: There is no abdominal tenderness.  Musculoskeletal:        General: No swelling.     Cervical back: Neck supple.     Right lower leg: Edema present.     Left lower leg: Edema present.  Skin:    General: Skin is warm and dry.     Capillary Refill: Capillary refill takes less than 2 seconds.  Neurological:     Mental Status: He is alert.  Psychiatric:        Mood and Affect: Mood normal.     (all labs ordered are listed, but only abnormal results are displayed) Labs Reviewed  CBC - Abnormal; Notable for the following components:      Result Value   WBC 11.5 (*)    All other components within normal limits  BASIC METABOLIC PANEL WITH GFR - Abnormal; Notable for the following components:   Glucose, Bld 123 (*)    BUN 26 (*)    Creatinine, Ser 1.85 (*)    GFR, Estimated 45 (*)    All other components within normal limits  PRO BRAIN NATRIURETIC PEPTIDE - Abnormal; Notable for the following components:   Pro Brain Natriuretic Peptide 1,408.0 (*)    All other components within normal limits    EKG: EKG Interpretation Date/Time:  Monday September 29 2024 03:55:55 EST Ventricular Rate:  106 PR Interval:  188 QRS Duration:  118 QT Interval:  378 QTC Calculation: 502 R Axis:   203  Text Interpretation: Sinus tachycardia Possible Left atrial enlargement Right superior axis deviation Possible Anterior infarct , age undetermined Abnormal ECG When compared with ECG of 01-Sep-2024 14:46, PREVIOUS ECG IS PRESENT Confirmed by Haze Lonni PARAS ([id number]) on 09/29/2024 4:03:23 AM  Radiology: [facility] Chest Portable 1 View Result Date: 09/29/2024 CLINICAL DATA:  Shortness of breath. EXAM: PORTABLE CHEST 1 VIEW COMPARISON:  08/10/2024 FINDINGS: The cardio pericardial silhouette is enlarged. Diffuse bilateral airspace disease noted with some sparing  in the apices. No substantial pleural effusion. No acute bony abnormality. IMPRESSION: Diffuse bilateral airspace disease with some sparing in the apices. Imaging features suggest edema although diffuse infection could have this appearance. Electronically Signed   By: Camellia Candle M.D.   On: 09/29/2024 05:05    .Critical Care  Performed by: Ruthell Lonni FALCON, PA-C Authorized by: Ruthell Lonni FALCON, PA-C   Critical care provider statement:    Critical care time (minutes):  45   Critical care time was exclusive of:  Separately billable procedures and treating other patients   Critical care was necessary to treat  or prevent imminent or life-threatening deterioration of the following conditions:  Respiratory failure   Critical care was time spent personally by me on the following activities:  Blood draw for specimens, development of treatment plan with patient or surrogate, discussions with consultants, evaluation of patient's response to treatment, discussions with primary provider, examination of patient, interpretation of cardiac output measurements, obtaining history from patient or surrogate, ordering and performing treatments and interventions, ordering and review of laboratory studies, ordering and review of radiographic studies, pulse oximetry, re-evaluation of patient's condition, vascular access procedures and ventilator management   I assumed direction of critical care for this patient from another provider in my specialty: no     Care discussed with: admitting provider      Medications Ordered in the ED  furosemide  (LASIX ) injection 80 mg (80 mg Intravenous Given 09/29/24 0423)  cefTRIAXone  (ROCEPHIN ) 1 g in sodium chloride  0.9 % 100 mL IVPB (1 g Intravenous New Bag/Given 09/29/24 0532)  azithromycin  (ZITHROMAX ) tablet 500 mg (500 mg Oral Given 09/29/24 0532)    Clinical Course as of 09/29/24 0616  Mon Sep 29, 2024  0402 EF 20 to 25% noted on 08/30/2024 [CG]    Clinical Course  User Index [CG] Ruthell Lonni FALCON, PA-C   Medical Decision Making Amount and/or Complexity of Data Reviewed Labs: ordered. Radiology: ordered.  Risk Prescription drug management.   43 year old male presents to the ED complaining of pedal edema, shortness of breath.  On exam, HD stable.  Lung sounds with rales bilaterally, hypoxic on room air 91%.  Abdomen soft and compressible.  Neuroexam at baseline.  1+ nonpitting edema to bilateral lower extremities.  Patient follows commands appropriately.   Assessed with CBC, BMP, BNP, chest x-ray and EKG.  Patient given 80 mg IV Lasix .  Patient home dosage of torsemide  is 80 mg by mouth once a day.  Patient CBC with slight leukocytosis 11.5, no anemia.  Metabolic panel with baseline creatinine 1.85, GFR 45, anion gap 10, BUN 26, no electrolyte derangement.  BNP elevated to 1408.  Chest x-ray reveals effusions as well as concerning findings for infection.  Patient started on Rocephin , azithromycin  at this time for community-acquired pneumonia.  Has already received IV Lasix .  Patient hypoxic to 91% room air.  Requires admission to hospital.  Patient admitted at this time to [facility] hospitalist service, Dr. Sundil, at 5:58 AM.  Patient stable on admission.   Final diagnoses:  Acute on chronic congestive heart failure, unspecified heart failure type [facility])  Community acquired pneumonia, unspecified laterality    ED Discharge Orders     None          Ruthell Lonni FALCON DEVONNA 09/29/24 9383    Haze Lonni PARAS, MD 09/29/24 [phone]  "

## 2024-09-29 NOTE — ED Triage Notes (Signed)
 Complaining of shortness of breath for the last 3-4 days. Has been having some swelling in his legs and feet as well.

## 2024-09-29 NOTE — Progress Notes (Signed)
 Heart Failure Navigator Progress Note  Assessed for Heart & Vascular TOC clinic readiness.  Patient does not meet criteria due to already established with AHF clinic, patient of Dr. Rolan.   Will sign off.   Duwaine Plant, PharmD, BCPS Heart Failure Stewardship Pharmacist Phone [phone]

## 2024-09-29 NOTE — H&P (Addendum)
 " History and Physical    Patient: Justin [name] FMW:[id number] DOB: December 15, 1980 DOA: 09/29/2024 DOS: the patient was seen and examined on 09/29/2024 PCP: Celestia Rosaline SQUIBB, NP  Patient coming from: Home  Chief Complaint:  Chief Complaint  Patient presents with   Shortness of Breath   HPI: Justin [name] is a 44 y.o. male with medical history significant of HFrEF (EF 20-25% in 08/2024), HTN, poorly controlled DM2 (A1c 13.9 in 08/2024), EtOH use, prior cocaine use, multiple admissions for ADHF (last admitted in 08/2024 with ADHF c/b AFRVR and rhinovirus; tx w/ dobutamine  and diuresis) who p/w SOB/DOE, BLE edema to calves, elevated pro-BNP (1408), and CXR c/w pulmonary edema c /w HFpEF exacerbation.  Pt is a limited historian. Despite having virtual bedside Spanish interpreter, Donnamarie, pt is unwilling to provide further medical history other than he presented because of SOB and BLE swelling. He is unable to convey the onset of sx, recent diet, or pta medication use.  In the ED, pt hypertensive, tachycardic, and tachypneic on 5-6L [place] (on RA pta). Labs notable for Cr 2.59>1.85. CXR showed diffuse bilateral airspace disease with some sparing in the apices [c/f pulmonary edema]. EDP started IV CTX/azithromycin  x1, as well as IV lasix  80mg  x1 and requested medicine admission.   Review of Systems: As mentioned in the history of present illness. All other systems reviewed and are negative. Past Medical History:  Diagnosis Date   Heart failure with reduced ejection fraction [facility]) 2017   [facility]   Hypertension 2017   [facility]   Past Surgical History:  Procedure Laterality Date   CARDIOVERSION N/A 03/17/2024   Procedure: CARDIOVERSION;  Surgeon: Rolan Ezra RAMAN, MD;  Location[facility] INVASIVE CV LAB;  Service: Cardiovascular;  Laterality: N/A;   CARDIOVERSION N/A 08/14/2024   Procedure: CARDIOVERSION;  Surgeon: Cherrie Toribio SAUNDERS, MD;  Location:  [facility] INVASIVE CV LAB;  Service: Cardiovascular;  Laterality: N/A;   RIGHT/LEFT HEART CATH AND CORONARY ANGIOGRAPHY N/A 02/16/2022   Procedure: RIGHT/LEFT HEART CATH AND CORONARY ANGIOGRAPHY;  Surgeon: Wonda Sharper, MD;  Location: [facility] INVASIVE CV LAB;  Service: Cardiovascular;  Laterality: N/A;   TRANSESOPHAGEAL ECHOCARDIOGRAM (CATH LAB) N/A 03/17/2024   Procedure: TRANSESOPHAGEAL ECHOCARDIOGRAM;  Surgeon: Rolan Ezra RAMAN, MD;  Location: [facility] INVASIVE CV LAB;  Service: Cardiovascular;  Laterality: N/A;   TRANSESOPHAGEAL ECHOCARDIOGRAM (CATH LAB) N/A 08/14/2024   Procedure: TRANSESOPHAGEAL ECHOCARDIOGRAM;  Surgeon: Cherrie Toribio SAUNDERS, MD;  Location: [facility] INVASIVE CV LAB;  Service: Cardiovascular;  Laterality: N/A;   Social History:  reports that he has never smoked. He has never used smokeless tobacco. He reports current alcohol use of about 30.0 standard drinks of alcohol per week. He reports that he does not currently use drugs after having used the following drugs: Crack cocaine and Cocaine.  Allergies[1]  Family History  Problem Relation Age of Onset   Hypertension Father     Prior to Admission medications  Medication Sig Start Date End Date Taking? Authorizing Provider  amiodarone  (PACERONE ) 200 MG tablet Take 1 tablet (200mg ) by mouth twice a day for 2 weeks, and then 1 tablet (200mg ) daily. 08/20/24  Yes Fairy Frames, MD  amLODipine  (NORVASC ) 10 MG tablet Take 1 tablet (10 mg total) by mouth daily. 08/21/24  Yes Fairy Frames, MD  atorvastatin  (LIPITOR) 20 MG tablet Take 1 tablet (20 mg total) by mouth daily. 08/21/24  Yes Newlin, Enobong, MD  glimepiride  (AMARYL ) 2 MG tablet Take 1 tablet (2 mg total)  by mouth daily. 08/21/24  Yes Newlin, Enobong, MD  hydrALAZINE  (APRESOLINE ) 100 MG tablet Take 1 tablet (100 mg total) by mouth with breakfast, with lunch, and with evening meal. 08/21/24  Yes Newlin, Enobong, MD  Insulin  Glargine (BASAGLAR  KWIKPEN) 100 UNIT/ML Inject 35 Units into the  skin daily. 09/11/24  Yes Newlin, Enobong, MD  insulin  lispro (HUMALOG  KWIKPEN) 100 UNIT/ML KwikPen Inject 5 Units into the skin 3 (three) times daily. 09/11/24  Yes Newlin, Enobong, MD  isosorbide  mononitrate (IMDUR ) 120 MG 24 hr tablet Take 1 tablet (120 mg total) by mouth daily. 08/20/24  Yes Fairy Frames, MD  potassium chloride  SA (KLOR-CON  M) 20 MEQ tablet Take 2 tablets (40 mEq total) by mouth every morning AND 1 tablet (20 mEq total) every evening. 09/01/24  Yes Marcine Catalan M, PA-C  rivaroxaban  (XARELTO ) 20 MG TABS tablet Take 20 mg by mouth daily with supper.   Yes [provider]  torsemide  (DEMADEX ) 20 MG tablet Take 4 tablets (80 mg total) by mouth daily. 09/01/24  Yes Marcine Catalan M, PA-C  apixaban (ELIQUIS) 5 MG TABS tablet Take 5 mg by mouth 2 (two) times daily. Patient not taking: Reported on 09/26/2024    [provider]  Fingerstix Lancets MISC Use as directed up to four times daily. 08/20/24   Fairy Frames, MD  glucose blood (TRUE METRIX PRO BLOOD GLUCOSE) test strip Use up to four times daily as directed. (FOR ICD-10 E10.9, E11.9). 08/20/24   Fairy Frames, MD  Insulin  Pen Needle (PEN NEEDLES) 32G X 4 MM MISC Use on Insulin  pens 08/20/24   Fairy Frames, MD  Lancet Device MISC Use as directed up to 4 times daily. 08/20/24   Fairy Frames, MD    Physical Exam: Vitals:   09/29/24 0354 09/29/24 0424 09/29/24 0551 09/29/24 0939  BP: (!) 161/94  139/85   Pulse: (!) 109 (!) 102 94   Resp: (!) 26  17   Temp: 98.5 F (36.9 C)  97.9 F (36.6 C) 98.1 F (36.7 C)  TempSrc:   Oral Oral  SpO2: 91% 97% 97%   Weight:      Height:       General: Alert, oriented x3, resting comfortably in no acute distress HEENT: EOMI, oropharynx clear, moist mucous membranes, hearing intact Neck: Trachea midline and no gross thyromegaly Respiratory: Lungs clear to auscultation bilaterally with normal respiratory effort; no w/r/r Cardiovascular: Regular rate  and rhythm w/o m/r/g Abdomen: Soft, nontender, nondistended. Positive bowel sounds MSK: No obvious joint deformities or swelling Skin: No obvious rashes or lesions Neurologic: Awake, alert, spontaneously moves all extremities, strength intact Psychiatric: Appropriate mood and affect, conversational and cooperative   Data Reviewed:  Lab Results  Component Value Date   WBC 11.5 (H) 09/29/2024   HGB 15.2 09/29/2024   HCT 48.7 09/29/2024   MCV 95.3 09/29/2024   PLT 242 09/29/2024   Lab Results  Component Value Date   GLUCOSE 123 (H) 09/29/2024   CALCIUM  9.0 09/29/2024   NA 138 09/29/2024   K 4.0 09/29/2024   CO2 26 09/29/2024   CL 102 09/29/2024   BUN 26 (H) 09/29/2024   CREATININE 1.85 (H) 09/29/2024   Lab Results  Component Value Date   ALT 22 08/11/2024   AST 25 08/11/2024   GGT 152 (H) 02/09/2022   ALKPHOS 88 08/11/2024   BILITOT 0.6 08/11/2024   Lab Results  Component Value Date   INR 3.1 (H) 03/14/2024   INR 4.3 (HH) 03/13/2024  INR 2.8 (H) 03/11/2024   Radiology: DG Chest Portable 1 View Result Date: 09/29/2024 CLINICAL DATA:  Shortness of breath. EXAM: PORTABLE CHEST 1 VIEW COMPARISON:  08/10/2024 FINDINGS: The cardio pericardial silhouette is enlarged. Diffuse bilateral airspace disease noted with some sparing in the apices. No substantial pleural effusion. No acute bony abnormality. IMPRESSION: Diffuse bilateral airspace disease with some sparing in the apices. Imaging features suggest edema although diffuse infection could have this appearance. Electronically Signed   By: Camellia Candle M.D.   On: 09/29/2024 05:05    Assessment and Plan: 81M h /o Afib, HFrEF (EF 20-25% in 08/2024), HTN, poorly controlled DM2 (A1c 13.9 in 08/2024), EtOH use, prior cocaine use, multiple admissions for ADHF (last admitted in 08/2024 with ADHF c/b AFRVR and rhinovirus; tx w/ dobutamine  and diuresis) who p/w SOB/DOE, BLE edema to calves, elevated pro-BNP (1408), and CXR c/w  pulmonary edema c /w HFrEF exacerbation.  HFrEF exacerbation Elevated pro-BNP SOB/DOE -IV lasix  80mg  BID for now; goal net neg 1-2L/d; strict I/Os; daily standing weights; K>4/Mg>2 -PTA amiodarone  200mg  daily, and atorvastatin  20mg  daily -Consider cards consult for GDMT, as pt would benefit from BB, SGLTi, and aldactone  if BP tolerates  Afib -PTA Xarelto  20mg  daily  Mild leukocytosis Low suspicion for concurrent CAP -Defer further abx (s/p IV CTX/azithromycin  x1 in ED) -F/u procalcitonin -F/u VBG  HTN -PTA amlodipine  10mg  daily  DM2 -35U long-acting insulin  daily + SSI TID AC prn   Advance Care Planning:   Code Status: Full Code   Consults: N/A  Family Communication: N/A  Severity of Illness: The appropriate patient status for this patient is INPATIENT. Inpatient status is judged to be reasonable and necessary in order to provide the required intensity of service to ensure the patient's safety. The patient's presenting symptoms, physical exam findings, and initial radiographic and laboratory data in the context of their chronic comorbidities is felt to place them at high risk for further clinical deterioration. Furthermore, it is not anticipated that the patient will be medically stable for discharge from the hospital within 2 midnights of admission.   * I certify that at the point of admission it is my clinical judgment that the patient will require inpatient hospital care spanning beyond 2 midnights from the point of admission due to high intensity of service, high risk for further deterioration and high frequency of surveillance required.*   ------- I spent 55 minutes reviewing previous notes, at the bedside counseling/discussing the treatment plan, and performing clinical documentation.  Author: Marsha Ada, MD 09/29/2024 9:59 AM  For on call review [email or web address].      [1] No Known Allergies  "

## 2024-09-30 ENCOUNTER — Inpatient Hospital Stay (HOSPITAL_COMMUNITY): Payer: Self-pay

## 2024-09-30 DIAGNOSIS — M79661 Pain in right lower leg: Secondary | ICD-10-CM

## 2024-09-30 LAB — URINE DRUG SCREEN
Amphetamines: NEGATIVE
Barbiturates: NEGATIVE
Benzodiazepines: NEGATIVE
Cocaine: NEGATIVE
Fentanyl: NEGATIVE
Methadone Scn, Ur: NEGATIVE
Opiates: NEGATIVE
Tetrahydrocannabinol: NEGATIVE

## 2024-09-30 LAB — CBC
HCT: 45.6 % (ref 39.0–52.0)
Hemoglobin: 14.1 g/dL (ref 13.0–17.0)
MCH: 29.4 pg (ref 26.0–34.0)
MCHC: 30.9 g/dL (ref 30.0–36.0)
MCV: 95.2 fL (ref 80.0–100.0)
Platelets: 219 K/uL (ref 150–400)
RBC: 4.79 MIL/uL (ref 4.22–5.81)
RDW: 14.4 % (ref 11.5–15.5)
WBC: 6.9 K/uL (ref 4.0–10.5)
nRBC: 0 % (ref 0.0–0.2)

## 2024-09-30 LAB — GLUCOSE, CAPILLARY
Glucose-Capillary: 99 mg/dL (ref 70–99)
Glucose-Capillary: 177 mg/dL — ABNORMAL HIGH (ref 70–99)
Glucose-Capillary: 116 mg/dL — ABNORMAL HIGH (ref 70–99)
Glucose-Capillary: 198 mg/dL — ABNORMAL HIGH (ref 70–99)

## 2024-09-30 LAB — BASIC METABOLIC PANEL WITH GFR
Anion gap: 11 (ref 5–15)
BUN: 28 mg/dL — ABNORMAL HIGH (ref 6–20)
CO2: 26 mmol/L (ref 22–32)
Calcium: 8.9 mg/dL (ref 8.9–10.3)
Chloride: 102 mmol/L (ref 98–111)
Creatinine, Ser: 1.94 mg/dL — ABNORMAL HIGH (ref 0.61–1.24)
GFR, Estimated: 43 mL/min — ABNORMAL LOW
Glucose, Bld: 90 mg/dL (ref 70–99)
Potassium: 3.9 mmol/L (ref 3.5–5.1)
Sodium: 139 mmol/L (ref 135–145)

## 2024-09-30 LAB — MAGNESIUM: Magnesium: 2.3 mg/dL (ref 1.7–2.4)

## 2024-09-30 MED ORDER — FUROSEMIDE 10 MG/ML IJ SOLN
120.0000 mg | Freq: Two times a day (BID) | INTRAVENOUS | Status: DC
  Administered 2024-09-30 – 2024-10-04 (×8): 120 mg via INTRAVENOUS
  Filled 2024-09-30 (×2): qty 10
  Filled 2024-09-30: qty 12
  Filled 2024-09-30 (×2): qty 120
  Filled 2024-09-30: qty 10
  Filled 2024-09-30 (×3): qty 12

## 2024-09-30 MED ORDER — METOPROLOL SUCCINATE ER 25 MG PO TB24
25.0000 mg | ORAL_TABLET | Freq: Every day | ORAL | Status: DC
  Administered 2024-09-30 – 2024-10-04 (×5): 25 mg via ORAL
  Filled 2024-09-30 (×5): qty 1

## 2024-09-30 MED ORDER — INSULIN GLARGINE 100 UNIT/ML ~~LOC~~ SOLN
30.0000 [IU] | Freq: Every day | SUBCUTANEOUS | Status: DC
  Administered 2024-10-01 – 2024-10-04 (×4): 30 [IU] via SUBCUTANEOUS
  Filled 2024-09-30 (×4): qty 0.3

## 2024-09-30 MED ORDER — METOLAZONE 5 MG PO TABS
5.0000 mg | ORAL_TABLET | Freq: Once | ORAL | Status: AC
  Administered 2024-09-30: 5 mg via ORAL
  Filled 2024-09-30: qty 1

## 2024-09-30 MED ORDER — GUAIFENESIN-DM 100-10 MG/5ML PO SYRP: 5.0000 mL | ORAL_SOLUTION | ORAL | Status: DC | PRN

## 2024-09-30 MED ORDER — FUROSEMIDE 10 MG/ML IJ SOLN
40.0000 mg | Freq: Once | INTRAMUSCULAR | Status: AC
  Administered 2024-09-30: 40 mg via INTRAVENOUS
  Filled 2024-09-30: qty 4

## 2024-09-30 MED ORDER — HYDRALAZINE HCL 50 MG PO TABS
50.0000 mg | ORAL_TABLET | Freq: Three times a day (TID) | ORAL | Status: DC
  Administered 2024-09-30 – 2024-10-04 (×12): 50 mg via ORAL
  Filled 2024-09-30 (×12): qty 1

## 2024-09-30 MED ORDER — INSULIN GLARGINE-YFGN 100 UNIT/ML ~~LOC~~ SOLN: 30.0000 [IU] | Freq: Every day | SUBCUTANEOUS | Status: DC

## 2024-09-30 MED ORDER — ISOSORBIDE MONONITRATE ER 30 MG PO TB24
30.0000 mg | ORAL_TABLET | Freq: Every day | ORAL | Status: DC
  Administered 2024-09-30: 30 mg via ORAL
  Filled 2024-09-30 (×2): qty 1

## 2024-09-30 NOTE — Inpatient Diabetes Management (Signed)
 Inpatient Diabetes Program Recommendations  AACE/ADA: New Consensus Statement on Inpatient Glycemic Control (2015)  Target Ranges:  Prepandial:   less than 140 mg/dL      Peak postprandial:   less than 180 mg/dL (1-2 hours)      Critically ill patients:  140 - 180 mg/dL   Lab Results  Component Value Date   GLUCAP 99 09/30/2024   HGBA1C 13.9 (H) 08/11/2024    Latest Reference Range & Units 09/29/24 11:47 09/29/24 18:06 09/29/24 18:54 09/29/24 21:23 09/30/24 06:08  Glucose-Capillary 70 - 99 mg/dL 87 69 (L) 887 (H) 867 (H) 99   Review of Glycemic Control  Diabetes history: DM2  Outpatient Diabetes medications:  Amaryl  2mg  daily  Glargine 35 units daily  Humalog  5 units TID    Current orders for Inpatient glycemic control:  Semglee  35 units daily  Novolog  0-15 units TID   Inpatient Diabetes Program Recommendations:   Noted - Hypoglycemia yesterday  - Recent admissions: Diabetes coordinator spoke with patient on 03/12/24, 08/13/24, and 08/19/24.   Please consider - Decrease Semglee  to 30 units daily   Thanks,  Lavanda Search, RN, MSN, [facility]  Inpatient Diabetes Coordinator  Pager [phone] (8a-5p)

## 2024-09-30 NOTE — Progress Notes (Signed)
 CSW spoke with patient about SDOH needs. Patient stated he has had a hard time paying for his bills to make ends meet. CSW provided patient with community resources.   Paulita Licklider, MSW, LCSWA Transitions of Care

## 2024-09-30 NOTE — Progress Notes (Signed)
 Assessment complete using interpreter [facility] # [id number]. Medications for the day explained. Patient understands that his fluid intake is restricted. Patient denies pain. Patient oriented to room and understands that the interpreter is always available to him. All questions answered. Patient states that he is hungry.

## 2024-09-30 NOTE — Progress Notes (Signed)
 Orthopedic Tech Progress Note Patient Details:  Justin [name] [facility] [birth year] [id number]  At this moment we're currently out of [facility] PASTE  Patient ID: Justin [name], male   DOB: 1981-05-11, 44 y.o.   MRN: [id number]  [name] Pac 09/30/2024, 12:37 PM

## 2024-09-30 NOTE — Progress Notes (Addendum)
 " PROGRESS NOTE    Justin [name]  FMW:[id number] DOB: March 28, 1981 DOA: 09/29/2024 PCP: Celestia Rosaline SQUIBB, NP   Brief Narrative:   44 y.o. male with medical history significant of HFrEF (EF 20-25% in 08/2024), HTN, poorly controlled DM2 (A1c 13.9 in 08/2024), EtOH use, prior cocaine use, multiple admissions for ADHF (last admitted in 08/2024 with ADHF c/b AFRVR and rhinovirus; tx w/ dobutamine  and diuresis) who p/w SOB/DOE, BLE edema to calves, elevated pro-BNP (1408), and CXR c/w pulmonary edema c /w HFrEF exacerbation.  He ran out of Lasix  5 days prior to admission.  Admitted for management of acute respiratory failure with hypoxia, acute on chronic congestive heart failure management. Cardiology consulted Lives at home with his aunt    Assessment & Plan:  Principal Problem:   Acute on chronic heart failure (HCC)    Acute on chronic systolic heart failure, POA: NYHA class III.  Transesophageal echocardiogram done in December 2025 showed EF of 20 to 25%. Patient ran out of Lasix  5 days back.   Continue with intravenous Lasix  80 mg twice a day along with close monitoring of renal function panel as well as electrolytes.  Strict intake or monitoring, daily weight, heart healthy diet. Consulted cardiology. I have ordered metoprolol  succinate 25 mg daily. Added hydralazine  50 mg PO Q8H and imdur  30 mg PO daily.   Acute respiratory failure with hypoxia, POA: Does not use oxygen at baseline.  Currently, he is requiring 4 L oxygen.  Goal oxygen saturation between 88 to 92%.  He will need home oxygen evaluation prior to discharge.  Bilateral lower extremity swelling/erythema, POA: I have ordered lower extremity venous duplex to rule out DVT  CKD stage IIIb, POA: Creatinine is at baseline.  This is likely cardiorenal.  Needs outpatient follow-up with nephrology.  Persistent atrial fibrillation, POA: Continue with Xarelto  20 mg daily as well as amiodarone  200 mg  daily  Hyperlipidemia, POA: Continue with atorvastatin  20 mg daily  Essential hypertension, POA: On amlodipine  10 mg daily. Added hydralazine  50 mg PO TID. Continue to monitor blood pressure closely  Insulin  Dependent type 2 diabetes mellitus, uncontrolled, POA: Continue with 30 units of long-acting insulin  daily (decreased from 35 units on 1/20) along with sliding scale insulin  3 times daily AC. HbA1c of 13.9% back in December 2025 Diabetes coordinator on board.  Class III obesity, POA: As evidenced by BMI of 50.8.  He is likely a candidate for GLP-1 agonist therapy versus metabolic and bariatric surgery.  He would need to follow-up outpatient for that. Insurance may be an issue  Possible OSA: Needs outpatient sleep study and likely CPAP.  Disposition: Lives at home with his aunt and is independent activities of daily life   DVT prophylaxis:  rivaroxaban  (XARELTO ) tablet 20 mg     Code Status: Full Code Family Communication: None at the bedside  Status is: Inpatient Remains inpatient appropriate because: Acute systolic HF    Subjective:  Spoke to the patient by using spanish interpreter, Shanda [phone]. He has been feeling short of breath for the past 5 days.  He ran out of Lasix  5 days back.  He said that he has been following up with a cardiologist and has been taking his medications as prescribed.  He lives at home with his aunt and is independent activities of daily life.  He primarily came to the hospital for evaluation of lower extremity swelling/edema as well as worsening shortness of breath.  He does not use oxygen at  home and is currently needing 4 L nasal oxygen cannula.  Examination:  General exam: Appears calm and comfortable  Respiratory system: Clear to auscultation. Respiratory effort normal. Cardiovascular system: S1 & S2 heard, RRR. No JVD, murmurs, rubs, gallops or clicks. 2+ b/l LE edema Gastrointestinal system: Abdomen is nondistended, soft and nontender. No  organomegaly or masses felt. Normal bowel sounds heard. Central nervous system: Alert and oriented. No focal neurological deficits. Extremities: Symmetric 5 x 5 power. B/l lower legs are swollen and slightly erythematous Skin: No rashes, lesions or ulcers Psychiatry: Judgement and insight appear normal. Mood & affect appropriate.     Diet Orders (From admission, onward)     Start     Ordered   09/29/24 0758  Diet Carb Modified Room service appropriate? Yes  Diet effective now       Question Answer Comment  Diet-HS Snack? Nothing   Calorie Level Medium 1600-2000   Fluid consistency: Thin   Room service appropriate? Yes      09/29/24 0757            Objective: Vitals:   09/29/24 2100 09/29/24 2300 09/30/24 0300 09/30/24 0735  BP:  126/72 130/83 137/75  Pulse: 78 99 91 87  Resp:  18 20 20   Temp:  98.2 F (36.8 C) 97.7 F (36.5 C) 98 F (36.7 C)  TempSrc:  Oral Oral Oral  SpO2: 95% 92% 94% 90%  Weight:   (!) 142.8 kg   Height:   5' 6 (1.676 m)     Intake/Output Summary (Last 24 hours) at 09/30/2024 0957 Last data filed at 09/30/2024 0336 Gross per 24 hour  Intake 960 ml  Output 2000 ml  Net -1040 ml   Filed Weights   09/29/24 0350 09/29/24 1900 09/30/24 0300  Weight: (!) 136.5 kg (!) 145.6 kg (!) 142.8 kg    Scheduled Meds:  amiodarone   200 mg Oral QHS   amLODipine   10 mg Oral Daily   atorvastatin   20 mg Oral Daily   furosemide   80 mg Intravenous BID   insulin  aspart  0-15 Units Subcutaneous TID WC   insulin  glargine-yfgn  35 Units Subcutaneous Daily   rivaroxaban   20 mg Oral Q supper   Continuous Infusions:  Nutritional status     Body mass index is 50.81 kg/m.  Data Reviewed:   CBC: Recent Labs  Lab 09/29/24 0403 09/30/24 0305  WBC 11.5* 6.9  HGB 15.2 14.1  HCT 48.7 45.6  MCV 95.3 95.2  PLT 242 219   Basic Metabolic Panel: Recent Labs  Lab 09/29/24 0403 09/30/24 0305  NA 138 139  K 4.0 3.9  CL 102 102  CO2 26 26  GLUCOSE 123*  90  BUN 26* 28*  CREATININE 1.85* 1.94*  CALCIUM  9.0 8.9  MG  --  2.3   GFR: Estimated Creatinine Clearance: 65.6 mL/min (A) (by C-G formula based on SCr of 1.94 mg/dL (H)). Liver Function Tests: No results for input(s): AST, ALT, ALKPHOS, BILITOT, PROT, ALBUMIN in the last 168 hours. No results for input(s): LIPASE, AMYLASE in the last 168 hours. No results for input(s): AMMONIA in the last 168 hours. Coagulation Profile: No results for input(s): INR, PROTIME in the last 168 hours. Cardiac Enzymes: No results for input(s): CKTOTAL, CKMB, CKMBINDEX, TROPONINI in the last 168 hours. BNP (last 3 results) Recent Labs    09/29/24 0403  PROBNP 1,408.0*   HbA1C: No results for input(s): HGBA1C in the last 72 hours. CBG: Recent Labs  Lab 09/29/24 1147 09/29/24 1806 09/29/24 1854 09/29/24 2123 09/30/24 0608  GLUCAP 87 69* 112* 132* 99   Lipid Profile: No results for input(s): CHOL, HDL, LDLCALC, TRIG, CHOLHDL, LDLDIRECT in the last 72 hours. Thyroid  Function Tests: No results for input(s): TSH, T4TOTAL, FREET4, T3FREE, THYROIDAB in the last 72 hours. Anemia Panel: No results for input(s): VITAMINB12, FOLATE, FERRITIN, TIBC, IRON, RETICCTPCT in the last 72 hours. Sepsis Labs: Recent Labs  Lab 09/29/24 1226  PROCALCITON 0.19    No results found for this or any previous visit (from the past 240 hours).       Radiology Studies: DG Chest Portable 1 View Result Date: 09/29/2024 CLINICAL DATA:  Shortness of breath. EXAM: PORTABLE CHEST 1 VIEW COMPARISON:  08/10/2024 FINDINGS: The cardio pericardial silhouette is enlarged. Diffuse bilateral airspace disease noted with some sparing in the apices. No substantial pleural effusion. No acute bony abnormality. IMPRESSION: Diffuse bilateral airspace disease with some sparing in the apices. Imaging features suggest edema although diffuse infection could have this  appearance. Electronically Signed   By: Camellia Candle M.D.   On: 09/29/2024 05:05           LOS: 1 day   Time spent= 35 mins    Deliliah Room, MD [facility]  If 7PM-7AM, please contact night-coverage  09/30/2024, 9:57 AM  "

## 2024-09-30 NOTE — Plan of Care (Signed)

## 2024-09-30 NOTE — Consult Note (Addendum)
 "   Advanced Heart Failure Team Consult Note   Primary Physician: Celestia Rosaline SQUIBB, NP Cardiologist:  Shelda Bruckner, MD HPI:    Justin [name] is seen today for evaluation of A/C HFrEF at the request of Dr. Dino.   Justin [name] is a 44 y.o. male with history of chronic systolic CHF d/t NICM (prior cardiac MRI concerning for possible sarcoid but pt never completed f/u PET), uncontrolled HTN, poorly controlled DM II, mitral regurgitation, ETOH abuse, hx cocaine use, Afib/AFL and poor compliance. Recently admitted 12/25 w/ a/c CHF w/ marked volume overload and low output and was back in AFL w/ RVR on admission. Initial co-ox 47%. Started on DBA + amio gtts. Respiratory panel + for rhinovirus. Diuresed w/ IV Lasix  then underwent TEE guided DCCV. TEE EF 20-25%, RV mod reduced, no LA thrombus. Had successful cardioversion back to NSR. He was transitioned to PO amiodarone  and weaned off DBA w/ stable co-ox. Transitioned to PO torsemide  and GDMT, though limited by CKD. He was not placed on an SGLT2i given poorly controlled DM, Hgb A1c 13. Discharge wt 267 lb.   Seen in AHF clinic 09/01/25. Reported compliance with all medications but weight was trending up. Medications adjusted.   Presented to the ED yesterday with increased SOB and BLE edema. Reports compliance with all medications except he failed to pick up his Torsemide  4-5 days ago and has been out. Denies CP. Has noticed weight gain. Admission labs reviewed: K 4, SCr 1.85, P-BNP 1.4K. EKG with ST.   Has been diuresing well with lasix . He is up 35lbs from the last AHF clinic visit 09/01/24. Getting BLE venous dopplers during exam. Able to lay flat.   Home Medications Prior to Admission medications  Medication Sig Start Date End Date Taking? Authorizing Provider  amiodarone  (PACERONE ) 200 MG tablet Take 1 tablet (200mg ) by mouth twice a day for 2 weeks, and then 1 tablet (200mg ) daily. 08/20/24  Yes  Fairy Frames, MD  amLODipine  (NORVASC ) 10 MG tablet Take 1 tablet (10 mg total) by mouth daily. 08/21/24  Yes Fairy Frames, MD  atorvastatin  (LIPITOR) 20 MG tablet Take 1 tablet (20 mg total) by mouth daily. 08/21/24  Yes Newlin, Enobong, MD  glimepiride  (AMARYL ) 2 MG tablet Take 1 tablet (2 mg total) by mouth daily. 08/21/24  Yes Newlin, Enobong, MD  hydrALAZINE  (APRESOLINE ) 100 MG tablet Take 1 tablet (100 mg total) by mouth with breakfast, with lunch, and with evening meal. 08/21/24  Yes Newlin, Enobong, MD  Insulin  Glargine (BASAGLAR  KWIKPEN) 100 UNIT/ML Inject 35 Units into the skin daily. 09/11/24  Yes Newlin, Enobong, MD  insulin  lispro (HUMALOG  KWIKPEN) 100 UNIT/ML KwikPen Inject 5 Units into the skin 3 (three) times daily. 09/11/24  Yes Newlin, Enobong, MD  isosorbide  mononitrate (IMDUR ) 120 MG 24 hr tablet Take 1 tablet (120 mg total) by mouth daily. 08/20/24  Yes Fairy Frames, MD  potassium chloride  SA (KLOR-CON  M) 20 MEQ tablet Take 2 tablets (40 mEq total) by mouth every morning AND 1 tablet (20 mEq total) every evening. 09/01/24  Yes Marcine Catalan M, PA-C  rivaroxaban  (XARELTO ) 20 MG TABS tablet Take 20 mg by mouth daily with supper.   Yes [provider]  torsemide  (DEMADEX ) 20 MG tablet Take 4 tablets (80 mg total) by mouth daily. 09/01/24  Yes Marcine Catalan M, PA-C  apixaban (ELIQUIS) 5 MG TABS tablet Take 5 mg by mouth 2 (two) times daily. Patient not taking: Reported on 09/26/2024  [provider]  Fingerstix Lancets MISC Use as directed up to four times daily. 08/20/24   Fairy Frames, MD  glucose blood (TRUE METRIX PRO BLOOD GLUCOSE) test strip Use up to four times daily as directed. (FOR ICD-10 E10.9, E11.9). 08/20/24   Fairy Frames, MD  Insulin  Pen Needle (PEN NEEDLES) 32G X 4 MM MISC Use on Insulin  pens 08/20/24   Fairy Frames, MD  Lancet Device MISC Use as directed up to 4 times daily. 08/20/24   Fairy Frames, MD    Past Medical  History: Past Medical History:  Diagnosis Date   Heart failure with reduced ejection fraction [facility]) 2017   [facility]   Hypertension 2017   [facility]    Past Surgical History: Past Surgical History:  Procedure Laterality Date   CARDIOVERSION N/A 03/17/2024   Procedure: CARDIOVERSION;  Surgeon: Rolan Ezra RAMAN, MD;  Location: [facility] INVASIVE CV LAB;  Service: Cardiovascular;  Laterality: N/A;   CARDIOVERSION N/A 08/14/2024   Procedure: CARDIOVERSION;  Surgeon: Cherrie Toribio SAUNDERS, MD;  Location: [facility] INVASIVE CV LAB;  Service: Cardiovascular;  Laterality: N/A;   RIGHT/LEFT HEART CATH AND CORONARY ANGIOGRAPHY N/A 02/16/2022   Procedure: RIGHT/LEFT HEART CATH AND CORONARY ANGIOGRAPHY;  Surgeon: Wonda Sharper, MD;  Location: [facility] INVASIVE CV LAB;  Service: Cardiovascular;  Laterality: N/A;   TRANSESOPHAGEAL ECHOCARDIOGRAM (CATH LAB) N/A 03/17/2024   Procedure: TRANSESOPHAGEAL ECHOCARDIOGRAM;  Surgeon: Rolan Ezra RAMAN, MD;  Location: [facility] INVASIVE CV LAB;  Service: Cardiovascular;  Laterality: N/A;   TRANSESOPHAGEAL ECHOCARDIOGRAM (CATH LAB) N/A 08/14/2024   Procedure: TRANSESOPHAGEAL ECHOCARDIOGRAM;  Surgeon: Cherrie Toribio SAUNDERS, MD;  Location: [facility] INVASIVE CV LAB;  Service: Cardiovascular;  Laterality: N/A;    Family History: Family History  Problem Relation Age of Onset   Hypertension Father     Social History: Social History   Socioeconomic History   Marital status: Single    Spouse name: Not on file   Number of children: 3   Years of education: Not on file   Highest education level: 7th grade  Occupational History   Occupation: education administrator    Comment: varies  Tobacco Use   Smoking status: Never   Smokeless tobacco: Never  Vaping Use   Vaping status: Never Used  Substance and Sexual Activity   Alcohol use: Yes    Alcohol/week: 30.0 standard drinks of alcohol    Types: 30 Cans of beer per week    Comment: occ   Drug use: Not Currently    Types: Crack cocaine,  Cocaine    Comment: 2 weeks   Sexual activity: Not on file  Other Topics Concern   Not on file  Social History Narrative   Not on file   Social Drivers of Health   Tobacco Use: Low Risk (09/29/2024)   Patient History    Smoking Tobacco Use: Never    Smokeless Tobacco Use: Never    Passive Exposure: Not on file  Financial Resource Strain: Not at Risk (05/07/2024)   Received from [employer]    How hard is it for you to pay for the very basics like food, housing, heating, medical care, and medications?: 1  Food Insecurity: No Food Insecurity (08/21/2024)   Epic    Worried About Radiation Protection Practitioner of Food in the Last Year: Never true    Ran Out of Food in the Last Year: Never true  Recent Concern: Food Insecurity - Food Insecurity Present (08/11/2024)   Epic  Worried About Programme Researcher, Broadcasting/film/video in the Last Year: Sometimes true    [employer] of Food in the Last Year: Sometimes true  Transportation Needs: No Transportation Needs (08/21/2024)   Epic    Lack of Transportation (Medical): No    Lack of Transportation (Non-Medical): No  Physical Activity: At Risk (05/07/2024)   Received from [facility]   Physical Activity    On average, how many minutes do you engage in exercise at this level?: 2  Stress: Not at Risk (05/07/2024)   Received from [facility]   Stress    Do you feel these kinds of stress these days?: 1  Social Connections: Not at Risk (05/07/2024)   Received from [facility]   Social Connections    How often do you see or talk to people that you care about and feel close to? (For example: talking to friends on phone, visiting friends or family, going to church or club meetings): 1  Depression (PHQ2-9): Low Risk (08/21/2024)   Depression (PHQ2-9)    PHQ-2 Score: 0  Alcohol Screen: Medium Risk (02/20/2022)   Alcohol Screen    Last Alcohol Screening Score (AUDIT): 12  Housing: High Risk (08/25/2024)   Epic    Unable to Pay for Housing in the Last Year: Yes    Number of Times  Moved in the Last Year: Not on file    Homeless in the Last Year: No  Utilities: Not At Risk (08/21/2024)   Epic    Threatened with loss of utilities: No  Health Literacy: Inadequate Health Literacy (09/21/2023)   B1300 Health Literacy    Frequency of need for help with medical instructions: Often    Allergies:  Allergies[1]  Objective:   Vital Signs:   Temp:  [97.3 F (36.3 C)-98.2 F (36.8 C)] 98 F (36.7 C) (01/20 0735) Pulse Rate:  [78-99] 87 (01/20 1044) Resp:  [18-20] 20 (01/20 0735) BP: (117-147)/(64-83) 137/75 (01/20 1044) SpO2:  [90 %-99 %] 90 % (01/20 0735) Weight:  [142.8 kg-145.6 kg] 142.8 kg (01/20 0300)    Weight change: Filed Weights   09/29/24 0350 09/29/24 1900 09/30/24 0300  Weight: (!) 136.5 kg (!) 145.6 kg (!) 142.8 kg    Intake/Output:   Intake/Output Summary (Last 24 hours) at 09/30/2024 1055 Last data filed at 09/30/2024 1000 Gross per 24 hour  Intake 1318 ml  Output 2275 ml  Net -957 ml    Physical Exam  General:  chronically ill appearing.   Cor: Regular rate & rhythm. No murmurs. JVD to jaw  Lungs: clear, diminished bases Extremities: +3-4 BLE edema   Telemetry   NSR 70s (Personally reviewed)    EKG    ST low 100s   Labs   Basic Metabolic Panel: Recent Labs  Lab 09/29/24 0403 09/30/24 0305  NA 138 139  K 4.0 3.9  CL 102 102  CO2 26 26  GLUCOSE 123* 90  BUN 26* 28*  CREATININE 1.85* 1.94*  CALCIUM  9.0 8.9  MG  --  2.3    Liver Function Tests: No results for input(s): AST, ALT, ALKPHOS, BILITOT, PROT, ALBUMIN in the last 168 hours. No results for input(s): LIPASE, AMYLASE in the last 168 hours. No results for input(s): AMMONIA in the last 168 hours.  CBC: Recent Labs  Lab 09/29/24 0403 09/30/24 0305  WBC 11.5* 6.9  HGB 15.2 14.1  HCT 48.7 45.6  MCV 95.3 95.2  PLT 242 219    Cardiac Enzymes: No results for input(s): CKTOTAL,  CKMB, CKMBINDEX, TROPONINI in the last 168  hours.  BNP: BNP (last 3 results) Recent Labs    03/09/24 1845 03/24/24 1637 08/10/24 2148  BNP 2,578.5* 1,035.3* 193.5*    ProBNP (last 3 results) Recent Labs    09/29/24 0403  PROBNP 1,408.0*     CBG: Recent Labs  Lab 09/29/24 1147 09/29/24 1806 09/29/24 1854 09/29/24 2123 09/30/24 0608  GLUCAP 87 69* 112* 132* 99    Coagulation Studies: No results for input(s): LABPROT, INR in the last 72 hours.   Imaging   No results found.  Medications:   Current Medications:  amiodarone   200 mg Oral QHS   amLODipine   10 mg Oral Daily   atorvastatin   20 mg Oral Daily   furosemide   80 mg Intravenous BID   insulin  aspart  0-15 Units Subcutaneous TID WC   [START ON 10/01/2024] insulin  glargine  30 Units Subcutaneous Daily   metoprolol  succinate  25 mg Oral Daily   rivaroxaban   20 mg Oral Q supper    Infusions:  Patient Profile   Justin [name] is a 44 y.o. male with history of chronic systolic CHF d/t NICM (prior cardiac MRI concerning for possible sarcoid but pt never completed f/u PET), uncontrolled HTN, poorly controlled DM II, mitral regurgitation, ETOH abuse, hx cocaine use, Afib/AFL and poor compliance. Admitted with acute on chronic HFrEF.   Assessment/Plan   Acute on chronic HFrEF: Long history of NiCM. Cath 6/23 with no significant CAD, CI 1.99.  Substance abuse (cocaine and ETOH) thought to play a role in cardiomyopathy, also uncontrolled HTN in the past.  Cardiac MRI 6/23: LVEF 29%, LV severely dilated, asymmetric LVH with basal septum measuring 17 mm, RVEF 45%, patchy LGE in septum and RV insertion sites + subepicardial LGE in inferolateral wall. Scar pattern and asymmetric hypertrophy could be consistent with hypertrophic cardiomyopathy. However, due to subepicardial LGE in inferolateral wall and presence of mediastinal lymphadenopathy, cardiac PET recommended to rule out cardiac sarcoidosis.  Not done d/t lack of insurance.  Echo 7/25: EF  20-25% with mod concentric LVH, severe RV dysfunction, probably severe functional MR with PISA ERO 0.47 cm^2, IVC dilated.  Low output HF noted at 7/25 admission, required milrinone . Admitted 12/25 w/ a/c CHF w/ low output, in setting of recurrent AFL w/ RVR. Required DBA to diurese. TEE 12/4: EF 20-25%, LV severely dilated, mod LVH, RV mod reduced, LA/RA severely dilated, mod MR.  - NYHA IV on admission.  - Massively volume overloaded on exam. Up 35lbs since 09/01/24. Increase lasix  to 120 IV BID + metolazone  5 mg x1. Has previously required inotrope support for diuresis, so far UOP adequate. Follow for now, if UOP decreases will add DBA.  - off ARB/ARN/Spiro w/ elevated SCr - no SGLT2i given poor glycemic control, Hgb A1c 13.9 12/25  - May need to hold BB, can continue low dose Toprol  for now.  - Place UNNA boots, if unable to d/t adhesive shortage please place TED hose instead. Discussed with RN.  - Would like to get a cardiac PET to rule out cardiac sarcoidosis, this is probably not an option with no insurance. - Not a candidate for advanced therapies with noncompliance, lack of insurance, RV dysfunction and CKD stage 4.   Atrial fibrillation/atypical flutter: Underwent TEE-guided DCCV back to NSR 7/25 but was back in [facility] 12/25 admission. S/p repeat TEE/DC-CV 12/4. Maintaining NSR on tele - continue amiodarone  200 mg daily  - Continue Xarelto  20 mg QHS -  suspect has OSA, will be unable to perform sleep study w/o insurance  Hypertension - BP elevated - Continue amlodipine  10 mg daily - Continue Toprol  XL 25 mg daily - Add hydralazine  50 (100 at home) mg TID + imdur  30 (120 at home) mg daily    CKD stage IV: Suspect cardiorenal syndrome + DM2 + HTN.  - Scr 1.94 today. Baseline SCr ~ 2.5-3 - Follow with diuresis, avoid hypotension - no SGLT2i yet w/ poorly controlled DM    Mitral regurgitation: Severe functional MR.  TEE 7/25 showed severe eccentric, posteriorly-directed MR with  restricted posterior leaflet, MR ERO 0.6 cm^2 by PISA.  - continue HF/AFL optimization per above    ETOH/cocaine abuse: Says he has quit. UDS negative on 12/25 admission. Check this admission.    DM2: Poor control.   - hgb a1c 13.9% - insulin  + glimepiride  - f/u w/ PCP     Length of Stay: 1  Justin LITTIE Coe, NP  09/30/2024, 10:55 AM  Advanced Heart Failure Team Pager [phone] (M-F; 7a - 5p)   Please visit [email or web address]: For overnight coverage please call cardiology fellow first. If fellow not available call Shock/ECMO MD on call.  For ECMO / Mechanical Support (Impella, IABP, LVAD) issues call Shock / ECMO MD on call.     Patient seen and examined with the above-signed Advanced Practice Provider and/or Housestaff. I personally reviewed laboratory data, imaging studies and relevant notes. I independently examined the patient and formulated the important aspects of the plan. I have edited the note to reflect any of my changes or salient points. I have personally discussed the plan with the patient and/or family.  48 y/oi male as above with severe systolic HF due to NICM. Admitted recently with volume overload and required inotrope support for low output.   Now admitted with massive volume overload after not taking diuretics.   So far responding well to IV lasix . Renal function stable. LA normal.   General:  Sitting up on side of bed. No resp difficulty HEENT: normal Neck: supple.JVP to ear  Cor: Regular rate & rhythm. No rubs, gallops or murmurs. Lungs: clear Abdomen: obese soft, nontender, nondistended.Good bowel sounds. Extremities: no cyanosis, clubbing, rash,3+ edema Neuro: alert & orientedx3, cranial nerves grossly intact. moves all 4 extremities w/o difficulty. Affect pleasant  He is massively volume overloaded. Would continue IV lasix  and add metolazone . Watch for low output s/s. Can support with milrinone  as needed.   Toribio Fuel, MD  2:48 PM      [1] No Known  Allergies  "

## 2024-09-30 NOTE — Progress Notes (Signed)
 Bilateral lower extremity venous duplex has been completed. Preliminary results can be found in CV Proc through chart review.   09/30/24 11:50 AM Cathlyn Collet RVT

## 2024-09-30 NOTE — Discharge Instructions (Addendum)
 Asistencia para Servicios Pblicos por: Sociedad de [place] de [place] Prximos pasos: Llame al [phone] para solicitar.  Acerca de: La Sociedad de [place] de [place] ofrece asistencia financiera de emergencia para cubrir facturas de gas, agua y electricidad.  Este programa ofrece:  Asistencia para servicios pblicos  La asistencia se brinda film/video editor del mes y se otorga por orden de building services engineer. Por favor, llame film/video editor del mes para solicitar.  Elegibilidad: La direccin del servicio debe estar en [place].  Ubicacin ms cercana: A 1.17 millas. Sociedad de [place] de [place] [address] [place], [place] [address]  Horario: Lunes: 07:00 AM - 12:00 PM Martes: 07:00 AM - 12:00 PM Mircoles: 07:00 AM - 12:00 PM Jueves: 07:00 AM - 12:00 PM Viernes: 07:00 AM - 12:00 PM  Asistencia para Renta/Hipoteca y Servicios Pblicos por: Health And Safety Inspector (GUM) Prximos pasos: Llame al [phone] para programar una cita.  Acerca de: [employer] trabaja para satisfacer las necesidades de personas y surveyor, mining en crisis en el rea metropolitana de [place] a travs del Programa de Asistencia de Associate Professor.  Este programa ofrece:  Ayuda para pagar servicios pblicos  Ayuda para pagar renta/hipoteca  Por favor, llame para programar una cita. Los posibles clientes se renen con un entrevistador que revisa su situacin y, tras calificar, reciben asistencia inmediata para facturas vencidas con el fin de prevenir el desalojo.  Ubicacin ms cercana: A 6.46 millas. [facility] [address] [place], [place] [address] [phone]  Horario: Lunes: 09:30 AM - 03:30 PM Martes: 09:30 AM - 03:30 PM Mircoles: 09:30 AM - 03:30 PM Jueves: 09:30 AM - 03:30 PM Viernes: 09:30 AM - 03:30 PM  Programa de Intervencin en Crisis (CIP) por: Departamento de Servicios Sociales del [place] Prximos pasos: Solicite en su sitio web: [email or web address] .  Acerca de: El Programa de Intervencin en Crisis (CIP) es un programa financiado por el gobierno federal que ayuda a dealer y familias que estn experimentando una crisis relacionada con calefaccin o aire acondicionado.  Este programa ofrece:  Asistencia para pagar calefaccin y aire acondicionado  La asistencia para calefaccin generalmente est disponible de octubre a abril (segn los fondos y las temperaturas). La asistencia para enfriamiento generalmente est disponible de mayo a septiembre (segn los fondos y las temperaturas).  Los residentes pueden solicitar en lnea a travs del [place] en [email or web address] .  Elegibilidad: Debe cumplir con los requisitos de ingresos. Debe estar experimentando o en riesgo de experimentar una emergencia relacionada con calefaccin o enfriamiento (emergencia que ponga en peligro la vida o la salud).  Ubicacin ms cercana: A 6.66 millas. Departamento de [place] del [place] [address] [place], [place] [address] [phone]  Horario: Lunes: 08:00 AM - 05:00 PM Martes: 08:00 AM - 05:00 PM Mircoles: 08:00 AM - 05:00 PM Jueves: 08:00 AM - 05:00 PM Viernes: 08:00 AM - 05:00 PM  Programa de Asistencia Energtica para Personas de Bajos Ingresos (LIEAP) por: Departamento de Salud y Servicios Humanos de [place] Prximos pasos: Llame al [phone] (su oficina ms cercana) para solicitar. Solicite en su sitio web: [email or web address] .  Acerca de: El Programa de Asistencia Energtica para Personas de Bajos Ingresos (LIEAP) es un programa financiado por el gobierno federal que proporciona un pago nico al proveedor para ayudar a los hogares elegibles a pagar sus facturas de calefaccin.  Este programa ofrece:  Beneficio financiero para patent attorney  de calefaccin  Los hogares que incluyen a una persona de 60 aos o ms, o a una persona que recibe beneficios y servicios por discapacidad a travs de la [place] de [place] y Servicios para Adultos de [place], pueden inscribirse del 1 al 31 de diciembre. Todos los dems hogares pueden solicitar del 1 de enero al 31 de marzo, o hasta que se allstate fondos.  Para ms informacin, comunquese con su Departamento de [address] local o con la Divisin de [place] de [place].  Elegibilidad: Este programa ayuda a personas con ingresos iguales o inferiores al 130% de las guas federales de pobreza. Debe haber al menos un ciudadano estadounidense o no ciudadano que cumpla con los criterios de elegibilidad. Debe tener reservas iguales o inferiores a $2,250. Debe ser responsable de los costos de calefaccin.  Ubicacin ms cercana: A 6.66 millas. Departamento de [place] del [place] [address] [place], [place] [address] [phone]  Horario: Darral: 12:00 AM - 12:00 AM Lunes: 08:00 AM - 05:00 PM Martes: 08:00 AM - 05:00 PM Mircoles: 08:00 AM - 05:00 PM Jueves: 08:00 AM - 05:00 PM Viernes: 08:00 AM - 05:00 PM Sbado: 12:00 AM - 12:00 AM  Programa de Asistencia de Emergencia para Renta (ERAP) por: El Ejrcito de [place] de [place] Prximos pasos: Solicite en su sitio web: [email or web address] . Llame al [phone] para obtener ms informacin.  Acerca de: El E[place]rcito de [place] de [place] brinda asistencia de emergencia para pagos de renta y/o servicios pblicos a hogares calificados que alquilan dentro de los lmites de la ciudad de [place]. La asistencia puede proporcionarse para pagos pasados, actuales o futuros. Tanto propietarios como inquilinos pueden solicitar.  Este programa ofrece:  Asistencia con la renta  Asistencia con servicios pblicos  Se dar prioridad a quienes estn  por debajo del 50% del AMI y a quienes estn desempleados. No se requiere ciudadana estadounidense para solicitar.  Puede registrarse en lnea. Una vez registrado, puede solicitar en lnea con los documentos. Alternativamente, puede descargar la solicitud y enviarla por correo al Ejrcito de [place] junto con la documentacin.  Elegibilidad: [address] a personas mayores de victorialand. Debe alquilar dentro de los lmites de la ciudad de [place]. Debe tener un ingreso total del hogar antes de impuestos igual o inferior al 80% del ingreso medio del rea (ver tabla). El hogar debe incluir al menos una persona que haya experimentado una dificultad financiera directa o indirectamente debido al COVID-19. El hogar debe incluir al menos una persona en riesgo de experimentar falta de vivienda o inestabilidad habitacional.  Ubicacin ms cercana: A 6.77 millas. El Ejrcito de [place] [address] [place], [place] [address] [phone]  Horario: Lunes: 08:00 AM - 05:00 PM Martes: 08:00 AM - 05:00 PM Mircoles: 08:00 AM - 05:00 PM Jueves: 08:00 AM - 05:00 PM Viernes: 08:00 AM - 05:00 PM   En relacin con su cita con Rosaline Bohr: Esperamos verle en su cita! Esta cita est reservada especficamente para usted. Cualquier cambio en esta cita podra afectar a muchos pacientes. Si le resulta imposible asistir, le rogamos que cancele su cita con al menos 24 horas de antelacin para que podamos ofrecerle este horario a scientist, forensic. Si lo desea, puede venir acompaado/a a su cita.   Recuerde traer lo siguiente a su cita:  Tarjeta(s) de seguro mdico vigente(s)  Todos sus medicamentos y una lista de los mismos  Licencia de conducir o identificacin vlida  Para agilizar  el engineer, maintenance (it), Biomedical Engineer MyChart y realice el registro electrnico para su cita! MyChart - Pgina de inicio de sesin   Informacin til:  No es necesario ayunar para su cita.  Si su  seguro requiere architect, sports coach.  Le rogamos que llegue 15 minutos antes de la hora programada de su cita.   Esperamos verle pronto! Si tiene alguna pregunta, no dude en contactarnos.

## 2024-09-30 NOTE — Plan of Care (Signed)
" °  Problem: Coping: Goal: Ability to adjust to condition or change in health will improve Outcome: Progressing   Problem: Fluid Volume: Goal: Ability to maintain a balanced intake and output will improve Outcome: Not Progressing   Problem: Health Behavior/Discharge Planning: Goal: Ability to manage health-related needs will improve Outcome: Progressing   Problem: Metabolic: Goal: Ability to maintain appropriate glucose levels will improve Outcome: Progressing   Problem: Nutritional: Goal: Maintenance of adequate nutrition will improve Outcome: Progressing   "

## 2024-10-01 DIAGNOSIS — E1169 Type 2 diabetes mellitus with other specified complication: Secondary | ICD-10-CM

## 2024-10-01 DIAGNOSIS — E785 Hyperlipidemia, unspecified: Secondary | ICD-10-CM

## 2024-10-01 DIAGNOSIS — I10 Essential (primary) hypertension: Secondary | ICD-10-CM

## 2024-10-01 DIAGNOSIS — I480 Paroxysmal atrial fibrillation: Secondary | ICD-10-CM

## 2024-10-01 DIAGNOSIS — N1832 Chronic kidney disease, stage 3b: Secondary | ICD-10-CM

## 2024-10-01 DIAGNOSIS — E66813 Obesity, class 3: Secondary | ICD-10-CM

## 2024-10-01 DIAGNOSIS — I5023 Acute on chronic systolic (congestive) heart failure: Secondary | ICD-10-CM

## 2024-10-01 LAB — GLUCOSE, CAPILLARY
Glucose-Capillary: 121 mg/dL — ABNORMAL HIGH (ref 70–99)
Glucose-Capillary: 103 mg/dL — ABNORMAL HIGH (ref 70–99)
Glucose-Capillary: 195 mg/dL — ABNORMAL HIGH (ref 70–99)
Glucose-Capillary: 140 mg/dL — ABNORMAL HIGH (ref 70–99)

## 2024-10-01 LAB — BASIC METABOLIC PANEL WITH GFR
Anion gap: 9 (ref 5–15)
BUN: 30 mg/dL — ABNORMAL HIGH (ref 6–20)
CO2: 36 mmol/L — ABNORMAL HIGH (ref 22–32)
Calcium: 9.5 mg/dL (ref 8.9–10.3)
Chloride: 93 mmol/L — ABNORMAL LOW (ref 98–111)
Creatinine, Ser: 2.07 mg/dL — ABNORMAL HIGH (ref 0.61–1.24)
GFR, Estimated: 40 mL/min — ABNORMAL LOW
Glucose, Bld: 95 mg/dL (ref 70–99)
Potassium: 3.4 mmol/L — ABNORMAL LOW (ref 3.5–5.1)
Sodium: 137 mmol/L (ref 135–145)

## 2024-10-01 LAB — MAGNESIUM: Magnesium: 2.2 mg/dL (ref 1.7–2.4)

## 2024-10-01 MED ORDER — POTASSIUM CHLORIDE CRYS ER 20 MEQ PO TBCR
40.0000 meq | EXTENDED_RELEASE_TABLET | Freq: Once | ORAL | Status: AC
  Administered 2024-10-01: 40 meq via ORAL
  Filled 2024-10-01: qty 2

## 2024-10-01 MED ORDER — ISOSORBIDE MONONITRATE ER 60 MG PO TB24
60.0000 mg | ORAL_TABLET | Freq: Every day | ORAL | Status: DC
  Administered 2024-10-01 – 2024-10-04 (×4): 60 mg via ORAL
  Filled 2024-10-01 (×4): qty 1

## 2024-10-01 MED ORDER — METOLAZONE 5 MG PO TABS
5.0000 mg | ORAL_TABLET | Freq: Once | ORAL | Status: AC
  Administered 2024-10-01: 5 mg via ORAL
  Filled 2024-10-01: qty 1

## 2024-10-01 NOTE — Assessment & Plan Note (Addendum)
 Continue metoprolol  and amiodarone  for rate and rhythm control, anticoagulation with rivaroxaban 

## 2024-10-01 NOTE — Plan of Care (Signed)
  Problem: Coping: Goal: Ability to adjust to condition or change in health will improve Outcome: Progressing   Problem: Fluid Volume: Goal: Ability to maintain a balanced intake and output will improve Outcome: Progressing   Problem: Nutritional: Goal: Maintenance of adequate nutrition will improve Outcome: Progressing   Problem: Tissue Perfusion: Goal: Adequacy of tissue perfusion will improve Outcome: Progressing

## 2024-10-01 NOTE — Assessment & Plan Note (Addendum)
 Continue basal insulin  30 units. During his hospitalization he was also placed on insulin  sliding scale. Glucose remained well controlled with fasting glucose on the day of discharge of 142 mg/dl.  At home will resume his insulin  regimen, long and short acting.  Continue with with statin

## 2024-10-01 NOTE — Assessment & Plan Note (Signed)
 Calculated BMI is 49.1

## 2024-10-01 NOTE — Progress Notes (Addendum)
 "    Advanced Heart Failure Rounding Note  Cardiologist: Shelda Bruckner, MD  AHF Cardiologist: Dr. Rolan Chief Complaint: A/C CHF Patient Profile   Justin [name] is a 44 y.o. male with history of chronic systolic CHF d/t NICM (prior cardiac MRI concerning for possible sarcoid but pt never completed f/u PET), uncontrolled HTN, poorly controlled DM II, mitral regurgitation, ETOH abuse, hx cocaine use, Afib/AFL and poor compliance.   Significant events:   1/20: Admitted with A/C CHF. 35 lb up  Subjective:    He is net negative 5L overnight with Lasix  120 mg bid and metolazone  dose. Weight is down 10 lbs. Labs are pending.   Objective:    Weight Range: (!) 138.2 kg Body mass index is 49.18 kg/m.   Vital Signs:   Temp:  [97.7 F (36.5 C)-98.5 F (36.9 C)] 97.7 F (36.5 C) (01/21 0414) Pulse Rate:  [74-87] 84 (01/21 0414) Resp:  [18-20] 18 (01/21 0414) BP: (109-143)/(61-87) 109/61 (01/21 0605) SpO2:  [90 %-92 %] 92 % (01/21 0414) Weight:  [138.2 kg] 138.2 kg (01/21 0414) Last BM Date : 09/30/24  Weight change: Filed Weights   09/29/24 1900 09/30/24 0300 10/01/24 0414  Weight: (!) 145.6 kg (!) 142.8 kg (!) 138.2 kg   Intake/Output:  Intake/Output Summary (Last 24 hours) at 10/01/2024 0702 Last data filed at 10/01/2024 0422 Gross per 24 hour  Intake 1252.7 ml  Output 6250 ml  Net -4997.3 ml    Physical Exam   General: Obese appearing. No distress  Cardiac: JVP to jaw. No murmurs  Extremities: Warm and dry.  3+ edema.  Neuro: A&O x3. Affect pleasant.   Telemetry   SR 70s (personally reviewed)  Labs   CBC Recent Labs    09/29/24 0403 09/30/24 0305  WBC 11.5* 6.9  HGB 15.2 14.1  HCT 48.7 45.6  MCV 95.3 95.2  PLT 242 219   Basic Metabolic Panel Recent Labs    98/80/73 0403 09/30/24 0305  NA 138 139  K 4.0 3.9  CL 102 102  CO2 26 26  GLUCOSE 123* 90  BUN 26* 28*  CREATININE 1.85* 1.94*  CALCIUM  9.0 8.9  MG  --  2.3    BNP (last 3 results) Recent Labs    03/09/24 1845 03/24/24 1637 08/10/24 2148  BNP 2,578.5* 1,035.3* 193.5*   ProBNP (last 3 results) Recent Labs    09/29/24 0403  PROBNP 1,408.0*   Medications:    Scheduled Medications:  amiodarone   200 mg Oral QHS   amLODipine   10 mg Oral Daily   atorvastatin   20 mg Oral Daily   hydrALAZINE   50 mg Oral Q8H   insulin  aspart  0-15 Units Subcutaneous TID WC   insulin  glargine  30 Units Subcutaneous Daily   isosorbide  mononitrate  30 mg Oral Daily   metoprolol  succinate  25 mg Oral Daily   rivaroxaban   20 mg Oral Q supper    Infusions:  furosemide  120 mg (09/30/24 1940)    PRN Medications: guaiFENesin -dextromethorphan  Assessment/Plan   Acute on chronic HFrEF: Long history of NiCM. Cath 6/23 with no significant CAD, CI 1.99.  Substance abuse (cocaine and ETOH) thought to play a role in cardiomyopathy, also uncontrolled HTN in the past.  Cardiac MRI 6/23: LVEF 29%, LV severely dilated, asymmetric LVH with basal septum measuring 17 mm, RVEF 45%, patchy LGE in septum and RV insertion sites + subepicardial LGE in inferolateral wall. Scar pattern and asymmetric hypertrophy could be consistent with  hypertrophic cardiomyopathy. However, due to subepicardial LGE in inferolateral wall and presence of mediastinal lymphadenopathy, cardiac PET recommended to rule out cardiac sarcoidosis.  Not done d/t lack of insurance.  Echo 7/25: EF 20-25% with mod concentric LVH, severe RV dysfunction, probably severe functional MR with PISA ERO 0.47 cm^2, IVC dilated.  Low output HF noted at 7/25 admission, required milrinone . Admitted 12/25 w/ a/c CHF w/ low output, in setting of recurrent AFL w/ RVR. Required DBA to diurese. TEE 12/4: EF 20-25%, LV severely dilated, mod LVH, RV mod reduced, LA/RA severely dilated, mod MR.  - NYHA IV on admission.  - Remains significantly volume overloaded. Diuresing well with IV Lasix . - Continue lasix  120 mg IV BID + metolazone  5  mg x1 today - add back spiro if Cr stable/down - off ARB/ARN w/ elevated SCr - no SGLT2i given poor glycemic control, Hgb A1c 13.9 12/25  - continue low dose Toprol  25 mg daily - continue hydral 50 mg tid + increase imdur  to 60 mg daily (on 100 hydral tid and imdur  120 daily at home) - Place UNNA boots, if unable to d/t adhesive shortage please place TED hose instead. Discussed with RN.  - Would like to get a cardiac PET to rule out cardiac sarcoidosis, this is probably not an option with no insurance. - Not a candidate for advanced therapies with noncompliance, lack of insurance, RV dysfunction and CKD stage 4.   Atrial fibrillation/atypical flutter: Underwent TEE-guided DCCV back to NSR 7/25 but was back in [facility] 12/25 admission. S/p repeat TEE/DC-CV 12/4. Maintaining NSR on tele. - continue amiodarone  200 mg daily  - continue Xarelto  20 mg QHS - suspect has OSA, will be unable to perform sleep study w/o insurance   Hypertension - BP improving - continue amlodipine  10 mg daily - GDMT changes as above   CKD stage IV: Suspect cardiorenal syndrome + DM2 + HTN.  - Scr 1.94 on admit. Baseline SCr fluctuant 1.8-2.5 - Follow with diuresis, avoid hypotension - no SGLT2i yet w/ poorly controlled DM    Mitral regurgitation: Severe functional MR. TEE 7/25 showed severe eccentric, posteriorly-directed MR with restricted posterior leaflet, MR ERO 0.6 cm^2 by PISA.  - continue HF/AFL optimization per above    ETOH/cocaine abuse: Says he has quit. UDS negative on 12/25 admission. UDS negative   DM2: Poor control.   - hgb a1c 13.9% - insulin  + glimepiride  - f/u w/ PCP    Length of Stay: 2  Jordan Lee, NP  10/01/2024, 7:02 AM  Advanced Heart Failure Team Pager [phone] (M-F; 7a - 5p)   Please visit [email or web address]: For overnight coverage please call cardiology fellow first. If fellow not available call Shock/ECMO MD on call.  For ECMO / Mechanical Support (Impella, IABP, LVAD) issues call  Shock / ECMO MD on call.    Patient seen and examined with the above-signed Advanced Practice Provider and/or Housestaff. I personally reviewed laboratory data, imaging studies and relevant notes. I independently examined the patient and formulated the important aspects of the plan. I have edited the note to reflect any of my changes or salient points. I have personally discussed the plan with the patient and/or family.  Continues to diurese on IV lasix . Feels better. Denies orthopnea or PND.   Labs pending  General:  Sitting up in bed. No resp difficulty HEENT: normal Neck: supple. JVP to jaw  Cor: Regular rate & rhythm. No rubs, gallops or murmurs. Lungs: clear Abdomen: obese, soft, nontender, nondistended.Good bowel  sounds. Extremities: no cyanosis, clubbing, rash, 2+ edema Neuro: alert & orientedx3, cranial nerves grossly intact. moves all 4 extremities w/o difficulty. Affect pleasant  Diuresing well with IV lasix . Still markedly volume overloaded. Await labs. Low threshold to add inotropes as needed.   Toribio Fuel, MD  9:03 AM   "

## 2024-10-01 NOTE — Hospital Course (Addendum)
 [name] was admitted to the hospital with the working diagnosis of heart failure decompensation   44 y.o. male with medical history significant of heart failure, hypertension, and T2DM who presented with dyspnea and lower extremity edema.  Recent hospitalization 11/30 to 08/20/24 for heart failure exacerbation, complicated with cardiogenic shock. He required dobutamine  infusion, aggressive diuresis and direct current cardioversion. Apparently at home he run out of his medications, then developed progressive dyspnea and edema.  On his initial physical examination his blood pressure was 161/94, HR 109 RR 26 and 02 saturation 91%  Lungs with no wheezing or rhonchi, heart with S1 and S2 present and regular, abdomen soft and non tender, positive lower extremity edema.   Na 138, K 4,0 Cl 102 bicarbonate 26 glucose 123 bun 23 cr 1,85  BNP 1,408  Wbc 11,5 hgb 15.2 plt 242   Chest radiograph with cardiomegaly, bilateral hilar vascular congestion with bilateral interstitial and alveolar infiltrates, predominant at the lower lobes.   EKG 106 bpm, right axis deviation, right bundle branch block, qtc 502, sinus rhythm with left atrial enlargement, no significant ST segment or T wave changes   Patient was placed on IV furosemide  for diuresis   01/22 responding well to diuresis  01/23 continue to improve, possible discharge home tomorrow  01/24 improved volume status, plan for discharge home today and follow up as outpatient.

## 2024-10-01 NOTE — Progress Notes (Signed)
" °  Progress Note   Patient: Justin [name] FMW:[id number] DOB: 02-Apr-1981 DOA: 09/29/2024     2 DOS: the patient was seen and examined on 10/01/2024   Brief hospital course: 44 y.o. male with medical history significant of HFrEF (EF 20-25% in 08/2024), HTN, poorly controlled DM2 (A1c 13.9 in 08/2024), EtOH use, prior cocaine use, multiple admissions for ADHF (last admitted in 08/2024 with ADHF c/b AFRVR and rhinovirus; tx w/ dobutamine  and diuresis) who p/w SOB/DOE, BLE edema to calves, elevated pro-BNP (1408), and CXR c/w pulmonary edema c /w HFrEF exacerbation.  He ran out of Lasix  5 days prior to admission.   Admitted for management of acute respiratory failure with hypoxia, acute on chronic congestive heart failure management. Cardiology consulted Lives at home with his aunt  Assessment and Plan: * Acute on chronic systolic CHF (congestive heart failure) (HCC) 03/2024 echocardiogram with reduced LV systolic function 20 to 25%, global hypokinesis, moderate LVH, fusion of EA, RV systolic function with severe reduction, RVSP 59.1 mmHg, moderate to severe mitral valve regurgitation, mild to moderate tricuspid valve regurgitation,   Urine output is 6,250 ml Systolic blood pressure 130 to 140 mmHg.   Plan to continue diuresis with furosemide  120 mg bid 01/21 metolazone  5 mg  Afterload reduction hydralazine  isosorbide   Continue metoprolol  succinate 25 mg   Essential hypertension Continue blood pressure control with hydralazine , isosorbide  and metoprolol .  Amlodipine  10 mg po daily   Paroxysmal atrial fibrillation with RVR (HCC) Continue metoprolol  and amiodarone  for rate and rhythm control, anticoagulation with rivaroxaban    CKD stage 3b, GFR 30-44 ml/min (HCC) Hypokalemia  Renal function with serum cr at 2,0 with K at 3,4 and serum bicarbonate at 30  Na 137 Mg 2.2   Plan to add Kcl 40 meq x1 Follow up renal function and electrolytes in am Continue diuresis   Type 2  diabetes mellitus with hyperlipidemia (HCC) Continue basal insulin  30 units and insulin  sliding scale for glucose cover and monitoring Continue with with statin   Class 3 obesity (HCC) Calculated BMI is 49.1         Subjective: Patient is feeling better, dyspnea and edema are improving but not yet back to baseline.   Physical Exam: Vitals:   10/01/24 0414 10/01/24 0605 10/01/24 0730 10/01/24 1150  BP: 133/75 109/61 (!) 140/83 128/84  Pulse: 84  75 65  Resp: 18  17 17   Temp: 97.7 F (36.5 C)  97.6 F (36.4 C)   TempSrc: Oral  Oral   SpO2: 92%  96% 94%  Weight: (!) 138.2 kg     Height:       Neurology awake and alert,  ENT with mild pallor Cardiovascular with S1 and S2 present and regular with no gallops or rubs, no murmurs No JVD Respiratory with mild rales at dependent zones with no wheezing Abdomen with no distention, soft and non tender Positive lower extremity edema +++ unna boots in place  Data Reviewed:    Family Communication: no family at the bedside   Disposition: Status is: Inpatient Remains inpatient appropriate because: IV diuresis   Planned Discharge Destination: Home   Author: Elidia Toribio Furnace, MD 10/01/2024 2:01 PM  For on call review [email or web address].  "

## 2024-10-01 NOTE — Assessment & Plan Note (Addendum)
 03/2024 echocardiogram with reduced LV systolic function 20 to 25%, global hypokinesis, moderate LVH, fusion of EA, RV systolic function with severe reduction, RVSP 59.1 mmHg, moderate to severe mitral valve regurgitation, mild to moderate tricuspid valve regurgitation,   Patient was placed on IV furosemide , had metolazone  and acetazolamide  for sequential nephron blockade.  Negative fluid balance was achieved, - 22,287 ml, with significant improvement in his symptoms   Afterload reduction hydralazine  isosorbide   Continue metoprolol  succinate 25 mg  Plan to continue diuresis at home with torsemide  100 mg po daily Will need close follow up as outpatient

## 2024-10-01 NOTE — Assessment & Plan Note (Addendum)
 Continue blood pressure control with hydralazine , isosorbide  and metoprolol .  Amlodipine  10 mg po daily

## 2024-10-01 NOTE — Progress Notes (Signed)
 Orthopedic Tech Progress Note Patient Details:  Justin [name] [facility] 1981-01-04 [id number]  Ortho Devices Type of Ortho Device: Ace wrap, Unna boot Ortho Device/Splint Location: BLE Ortho Device/Splint Interventions: Ordered, Application, Adjustment  applied by BW    Post Interventions Patient Tolerated: Well Instructions Provided: Care of device  [name] Pac 10/01/2024, 11:17 AM

## 2024-10-01 NOTE — Assessment & Plan Note (Addendum)
 AKI Hypokalemia  Patient had aggressive diuresis, sequential nephron blockade with acetazolamide , furosemide  and metolazone    At the time of discharge his volume status has improved.  Renal function with serum cr at 3,0 with K at 3,5 and serum bicarbonate at 33 Na 136 and Mg 2.4   Plan to continue diuresis with torsemide  100 mg po daily and follow up renal function and electrolytes as outpatient

## 2024-10-02 ENCOUNTER — Encounter: Payer: Self-pay | Admitting: Dietician

## 2024-10-02 LAB — BASIC METABOLIC PANEL WITH GFR
Anion gap: 9 (ref 5–15)
BUN: 30 mg/dL — ABNORMAL HIGH (ref 6–20)
CO2: 39 mmol/L — ABNORMAL HIGH (ref 22–32)
Calcium: 9.7 mg/dL (ref 8.9–10.3)
Chloride: 90 mmol/L — ABNORMAL LOW (ref 98–111)
Creatinine, Ser: 2 mg/dL — ABNORMAL HIGH (ref 0.61–1.24)
GFR, Estimated: 41 mL/min — ABNORMAL LOW
Glucose, Bld: 150 mg/dL — ABNORMAL HIGH (ref 70–99)
Potassium: 3.5 mmol/L (ref 3.5–5.1)
Sodium: 138 mmol/L (ref 135–145)

## 2024-10-02 LAB — MAGNESIUM: Magnesium: 2.1 mg/dL (ref 1.7–2.4)

## 2024-10-02 LAB — GLUCOSE, CAPILLARY
Glucose-Capillary: 93 mg/dL (ref 70–99)
Glucose-Capillary: 186 mg/dL — ABNORMAL HIGH (ref 70–99)
Glucose-Capillary: 199 mg/dL — ABNORMAL HIGH (ref 70–99)
Glucose-Capillary: 128 mg/dL — ABNORMAL HIGH (ref 70–99)
Glucose-Capillary: 280 mg/dL — ABNORMAL HIGH (ref 70–99)

## 2024-10-02 MED ORDER — ACETAZOLAMIDE 250 MG PO TABS
500.0000 mg | ORAL_TABLET | Freq: Two times a day (BID) | ORAL | Status: AC
  Administered 2024-10-02 (×2): 500 mg via ORAL
  Filled 2024-10-02 (×2): qty 2

## 2024-10-02 MED ORDER — POTASSIUM CHLORIDE CRYS ER 20 MEQ PO TBCR
40.0000 meq | EXTENDED_RELEASE_TABLET | Freq: Once | ORAL | Status: AC
  Administered 2024-10-02: 40 meq via ORAL
  Filled 2024-10-02: qty 2

## 2024-10-02 MED ORDER — SPIRONOLACTONE 25 MG PO TABS
25.0000 mg | ORAL_TABLET | Freq: Every day | ORAL | Status: DC
  Administered 2024-10-02: 25 mg via ORAL
  Filled 2024-10-02: qty 1

## 2024-10-02 NOTE — TOC Initial Note (Signed)
 Transition of Care [facility]) - Initial/Assessment Note    Patient Details  Name: Justin [name] MRN: [id number] Date of Birth: [birth year]  Transition of [facility]) CM/SW Contact:    Justin Barnie Rama, RN Phone Number: 10/02/2024, 3:00 PM  Clinical Narrative:                 Used Video intrepreter [phone] Justin [name],From home with Aunt, has PCP and no insurance on file, states has no HH services in place at this time or DME at home.  States family member or friend will transport them home at [employer] and family is support system, states gets medications from [facility] but is ok with [facility] pharmacy filling meds at dc.  Pta self ambulatory.   He does not have insurance but he does have at least 20 dollars with him in case he needs to pay for medications.  Expected Discharge Plan: Home/Self Care Barriers to Discharge: Continued Medical Work up   Patient Goals and CMS Choice Patient states their goals for this hospitalization and ongoing recovery are:: return home   Choice offered to / list presented to : NA      Expected Discharge Plan and Services In-house Referral: NA Discharge Planning Services: CM Consult Post Acute Care Choice: NA Living arrangements for the past 2 months: Single Family Home                 DME Arranged: N/A DME Agency: NA       HH Arranged: NA          Prior Living Arrangements/Services Living arrangements for the past 2 months: Single Family Home Lives with:: Relatives Midwife) Patient language and need for interpreter reviewed:: Yes Do you feel safe going back to the place where you live?: Yes      Need for Family Participation in Patient Care: Yes (Comment) Care giver support system in place?: Yes (comment)   Criminal Activity/Legal Involvement Pertinent to Current Situation/Hospitalization: No - Comment as needed  Activities of Daily Living      Permission Sought/Granted Permission sought to share information with : Case  Manager Permission granted to share information with : Yes, Verbal Permission Granted              Emotional Assessment Appearance:: Appears stated age     Orientation: : Oriented to Self, Oriented to Place, Oriented to  Time, Oriented to Situation Alcohol / Substance Use: Not Applicable Psych Involvement: No (comment)  Admission diagnosis:  Acute on chronic heart failure (HCC) [I50.9] Community acquired pneumonia, unspecified laterality [J18.9] Acute on chronic congestive heart failure, unspecified heart failure type (HCC) [I50.9] Patient Active Problem List   Diagnosis Date Noted   Atrial flutter with rapid ventricular response (HCC) 08/10/2024   Hypokalemia 08/10/2024   Elevated troponin 08/10/2024   Acute hypoxic respiratory failure (HCC) 03/09/2024   Acute on chronic heart failure (HCC) 08/22/2023   CHF (congestive heart failure) (HCC) 03/10/2023   Uncontrolled hypertension 11/17/2022   Type 2 diabetes mellitus without complication, without [name]-term current use of insulin  (HCC) 11/17/2022   Acute exacerbation of CHF (congestive heart failure) (HCC) 11/16/2022   Paroxysmal atrial fibrillation with RVR (HCC) 09/22/2022   Acute on chronic congestive heart failure (HCC) 09/21/2022   CKD stage 3b, GFR 30-44 ml/min (HCC) 09/21/2022   Alcohol abuse 09/21/2022   Class 3 obesity (HCC) 06/04/2022   AKI (acute kidney injury) 06/03/2022   Acute on chronic HFrEF (heart failure with reduced ejection fraction) (HCC)  06/02/2022   Type 2 diabetes mellitus with hyperlipidemia (HCC) 06/02/2022   Polysubstance abuse (HCC) 06/02/2022   Ventricular tachycardia (HCC)    Primary hypertension    Cardiomyopathy due to hypertension, with heart failure (HCC)    Acute on chronic systolic CHF (congestive heart failure) (HCC)    Nonrheumatic mitral valve regurgitation    Dyspnea    Community acquired pneumonia    Acute hypoxemic respiratory failure (HCC) 02/09/2022   Hypertriglyceridemia  04/18/2018   Nonischemic cardiomyopathy (HCC) 03/29/2018   Chronic diastolic heart failure (HCC) 03/29/2018   Essential hypertension 03/29/2018   PCP:  Justin Rosaline SQUIBB, NP Pharmacy:   [facility] [address] [place] [address] Phone: [phone] Fax: [phone]  [place] [facility] [address] [place] [address] Phone: [phone] Fax: [phone]  [facility] [address] [place] [facility] [address] [place] [address] Phone: [phone] Fax: [phone]  [facility] [address] [place] [address] Phone: [phone] Fax: [phone]  Justin [name] - [facility] [address] [place] [address] Phone: [phone] Fax: [phone]     Social Drivers of Health (SDOH) Social History: SDOH Screenings   Food Insecurity: No Food Insecurity (09/30/2024)  Recent Concern: Food Insecurity - Food Insecurity Present (08/11/2024)  Housing: High Risk (09/30/2024)  Transportation Needs: Unmet Transportation Needs (09/30/2024)  Utilities: Not At Risk (09/30/2024)  Alcohol Screen: Medium Risk (02/20/2022)  Depression (PHQ2-9): Low Risk (08/21/2024)  Financial Resource Strain: Not at Risk (05/07/2024)   Received from [facility]  Physical Activity: At Risk (05/07/2024)   [facility] from [facility]  Social Connections: Not at Risk (05/07/2024)   Received from [facility]  Stress: Not at Risk (05/07/2024)   Received from [facility]  Tobacco Use: Low Risk (09/29/2024)  Health Literacy: Inadequate Health Literacy (09/21/2023)   SDOH Interventions: Housing Interventions: Inpatient TOC, Community Resources Provided Transportation Interventions: Inpatient TOC, Patient Declined   Readmission Risk Interventions    10/02/2024    2:57 PM  Readmission Risk Prevention Plan  Transportation  Screening Complete  PCP or Specialist Appt within 3-5 Days Complete  HRI or Home Care Consult Complete  Palliative Care Screening Not Applicable  Medication Review (RN Care Manager) Complete

## 2024-10-02 NOTE — Progress Notes (Addendum)
 " Progress Note   Patient: Justin [name] FMW:[id number] DOB: [birth year] DOA: 09/29/2024     3 DOS: the patient was seen and examined on 10/02/2024   Brief hospital course: Justin [name] was admitted to the hospital with the working diagnosis of heart failure decompensation   44 y.o. male with medical history significant of heart failure, hypertension, T2Dm, and  EtOH use, who presented with dyspnea and lower extremity edema.  Recent hospitalization 11/30 to 08/20/24 for heart failure exacerbation, complicated with cardiogenic shock. He required dobutamine  infusion, aggressive diuresis and direct current cardioversion. Apparently at home he run out of his medications, then developed progressive dyspnea and edema.  On his initial physical examination his blood pressure was 161/94, HR 109 RR 26 and 02 saturation 91%  Lungs with no wheezing or rhonchi, heart with S1 and S2 present and regular, abdomen soft and non tender, positive lower extremity edema.   Na 138, K 4,0 Cl 102 bicarbonate 26 glucose 123 bun 23 cr 1,85  BNP 1,408  Wbc 11,5 hgb 15.2 plt 242   Chest radiograph with cardiomegaly, bilateral hilar vascular congestion with bilateral interstitial and alveolar infiltrates, predominant at the lower lobes.   EKG 106 bpm, right axis deviation, right bundle branch block, qtc 502, sinus rhythm with left atrial enlargement, no significant ST segment or T wave changes   Patient was placed on IV furosemide  for diuresis   01/22 responding well to diuresis    Assessment and Plan: * Acute on chronic systolic CHF (congestive heart failure) (HCC) 03/2024 echocardiogram with reduced LV systolic function 20 to 25%, global hypokinesis, moderate LVH, fusion of EA, RV systolic function with severe reduction, RVSP 59.1 mmHg, moderate to severe mitral valve regurgitation, mild to moderate tricuspid valve regurgitation,   Urine output is 8000 ml Systolic blood pressure 114 mmHg.   Plan  to continue diuresis with furosemide  120 mg bid 01/21 metolazone  5 mg  Afterload reduction hydralazine  isosorbide   Continue metoprolol  succinate 25 mg   Essential hypertension Continue blood pressure control with hydralazine , isosorbide  and metoprolol .  Amlodipine  10 mg po daily   Paroxysmal atrial fibrillation with RVR (HCC) Continue metoprolol  and amiodarone  for rate and rhythm control, anticoagulation with rivaroxaban    CKD stage 3b, GFR 30-44 ml/min (HCC) Hypokalemia  Stable renal function with serum cr at 2,0 with K at 3,5 and serum bicarbonate at 39  Na 138 mg 2.1   Plan to add Kcl 40 meq x1 Follow up renal function and electrolytes in am Continue diuresis   Type 2 diabetes mellitus with hyperlipidemia (HCC) Continue basal insulin  30 units and insulin  sliding scale for glucose cover and monitoring Continue with with statin   Class 3 obesity (HCC) Calculated BMI is 49.1         Subjective: Patient is feeling better, dyspnea has improved along with lower extremity edema but not yet back to baseline   Physical Exam: Vitals:   10/02/24 0758 10/02/24 0921 10/02/24 1107 10/02/24 1414  BP: 127/76 127/76 114/70 118/68  Pulse: 64 74 64   Resp: 20  20   Temp: 98.1 F (36.7 C)  (!) 97.5 F (36.4 C)   TempSrc: Axillary  Oral   SpO2: 94%  93%   Weight:      Height:       Neurology awake and alert ENT with mild pallor with no icterus Cardiovascular with S1 and S2 present and regular with no gallops, rubs or murmurs Mild JVD Respiratory with mild rales  at bases with no wheezing or rhonchi Abdomen with no distention,  Positive lower extremity edema   Data Reviewed:    Family Communication: no family at the bedside   Disposition: Status is: Inpatient Remains inpatient appropriate because: IV diuresis   Planned Discharge Destination: Home     Author: Elidia Toribio Furnace, MD 10/02/2024 2:25 PM  For on call review [email or web address]  "

## 2024-10-02 NOTE — Progress Notes (Addendum)
 "    Advanced Heart Failure Rounding Note  Cardiologist: Shelda Bruckner, MD  [facility] Cardiologist: Dr. Rolan Chief Complaint: A/C CHF Patient Profile   Justin [name] is a 44 y.o. male with history of chronic systolic CHF d/t NICM (prior cardiac MRI concerning for possible sarcoid but pt never completed f/u PET), uncontrolled HTN, poorly controlled DM II, mitral regurgitation, ETOH abuse, hx cocaine use, Afib/AFL and poor compliance.   Significant events:   1/20: Admitted with A/C CHF. 35 lb up  Subjective:    Vitals stable. Net negative 7.5L. Weight down 12lb (27lb total). Still appears above dry weight. sCr 2.07>2.00. K 3.5  Drowsy on exam. No SOB, slept well overnight.   Objective:    Weight Range: 132.8 kg Body mass index is 47.26 kg/m.   Vital Signs:   Temp:  [97.6 F (36.4 C)-98.5 F (36.9 C)] 98.5 F (36.9 C) (01/22 0254) Pulse Rate:  [65-75] 73 (01/21 2335) Resp:  [17-19] 19 (01/22 0254) BP: (123-140)/(74-91) 134/91 (01/21 2335) SpO2:  [94 %-96 %] 94 % (01/21 2335) Weight:  [132.8 kg] 132.8 kg (01/22 0254) Last BM Date : 10/01/24  Weight change: Filed Weights   09/30/24 0300 10/01/24 0414 10/02/24 0254  Weight: (!) 142.8 kg (!) 138.2 kg 132.8 kg   Intake/Output:  Intake/Output Summary (Last 24 hours) at 10/02/2024 0709 Last data filed at 10/02/2024 9391 Gross per 24 hour  Intake 440 ml  Output 7450 ml  Net -7010 ml    Physical Exam   General: Obese appearing. No distress  Cardiac: JVP difficult to assess. No murmurs  Abdomen: Soft, non-distended.  Extremities: Warm and dry.  1+ BLE edema. + unna Neuro: A&O x3. Affect pleasant.   Telemetry   SR 60s (personally reviewed)  Labs   CBC Recent Labs    09/30/24 0305  WBC 6.9  HGB 14.1  HCT 45.6  MCV 95.2  PLT 219   Basic Metabolic Panel Recent Labs    98/78/73 0933 10/02/24 0248  NA 137 138  K 3.4* 3.5  CL 93* 90*  CO2 36* 39*  GLUCOSE 95 150*  BUN 30* 30*   CREATININE 2.07* 2.00*  CALCIUM  9.5 9.7  MG 2.2 2.1   BNP (last 3 results) Recent Labs    03/09/24 1845 03/24/24 1637 08/10/24 2148  BNP 2,578.5* 1,035.3* 193.5*   ProBNP (last 3 results) Recent Labs    09/29/24 0403  PROBNP 1,408.0*   Medications:    Scheduled Medications:  amiodarone   200 mg Oral QHS   amLODipine   10 mg Oral Daily   atorvastatin   20 mg Oral Daily   hydrALAZINE   50 mg Oral Q8H   insulin  aspart  0-15 Units Subcutaneous TID WC   insulin  glargine  30 Units Subcutaneous Daily   isosorbide  mononitrate  60 mg Oral Daily   metoprolol  succinate  25 mg Oral Daily   potassium chloride   40 mEq Oral Once   rivaroxaban   20 mg Oral Q supper    Infusions:  furosemide  120 mg (10/01/24 1831)    PRN Medications: guaiFENesin -dextromethorphan  Assessment/Plan   Acute on chronic HFrEF: Long history of NiCM. Cath 6/23 with no significant CAD, CI 1.99.  Substance abuse (cocaine and ETOH) thought to play a role in cardiomyopathy, also uncontrolled HTN in the past.  Cardiac MRI 6/23: LVEF 29%, LV severely dilated, asymmetric LVH with basal septum measuring 17 mm, RVEF 45%, patchy LGE in septum and RV insertion sites + subepicardial LGE in  inferolateral wall. Scar pattern and asymmetric hypertrophy could be consistent with hypertrophic cardiomyopathy. However, due to subepicardial LGE in inferolateral wall and presence of mediastinal lymphadenopathy, cardiac PET recommended to rule out cardiac sarcoidosis.  Not done d/t lack of insurance.  Echo 7/25: EF 20-25% with mod concentric LVH, severe RV dysfunction, probably severe functional MR with PISA ERO 0.47 cm^2, IVC dilated.  Low output HF noted at 7/25 admission, required milrinone . Admitted 12/25 w/ a/c CHF w/ low output, in setting of recurrent AFL w/ RVR. Required DBA to diurese. TEE 12/4: EF 20-25%, LV severely dilated, mod LVH, RV mod reduced, LA/RA severely dilated, mod MR.  - NYHA IV on admission.  - Remains hypervolemic.  Diuresing well with IV Lasix . - Continue lasix  120 mg IV BID. Give diamox  500 mg bid today - unna boots on - restart spirolactone 25 mg daily - off ARB/ARN w/ elevated SCr - no SGLT2i given poor glycemic control, Hgb A1c 13.9 12/25  - continue low dose Toprol  25 mg daily - continue hydral 50 mg tid + imdur  to 60 mg daily (on 100 hydral tid and imdur  120 daily at home) - Would like to get a cardiac PET to rule out cardiac sarcoidosis, this is probably not an option with no insurance. - Not a candidate for advanced therapies with noncompliance, lack of insurance, RV dysfunction and CKD stage 4.   Atrial fibrillation/atypical flutter: Underwent TEE-guided DCCV back to NSR 7/25 but was back in [facility] 12/25 admission. S/p repeat TEE/DC-CV 12/4.  - remains in NSR  - continue amiodarone  200 mg daily  - continue Xarelto  20 mg QHS - suspect has OSA, will be unable to perform sleep study w/o insurance   Hypertension - resolved; would not lower BP further with renal function - continue amlodipine  10 mg daily - GDMT changes as above   CKD stage IV: Suspect cardiorenal syndrome + DM2 + HTN.  - baseline SCr fluctuant 1.8-2.5 - follow with diuresis, avoid hypotension - no SGLT2i yet w/ poorly controlled DM    Mitral regurgitation: Severe functional MR. TEE 7/25 showed severe eccentric, posteriorly-directed MR with restricted posterior leaflet, MR ERO 0.6 cm^2 by PISA.  - continue HF/AFL optimization per above    ETOH/cocaine abuse: Says he has quit. UDS negative on 12/25 admission. UDS negative   DM2: Poor control.   - hgb a1c 13.9% - insulin  + glimepiride  - f/u w/ PCP    Length of Stay: 3  Jordan Lee, NP  10/02/2024, 7:09 AM  Advanced Heart Failure Team Pager [phone] (M-F; 7a - 5p)   Please visit [email or web address]: For overnight coverage please call cardiology fellow first. If fellow not available call Shock/ECMO MD on call.  For ECMO / Mechanical Support (Impella, IABP, LVAD) issues call  Shock / ECMO MD on call.    Patient seen and examined with the above-signed Advanced Practice Provider and/or Housestaff. I personally reviewed laboratory data, imaging studies and relevant notes. I independently examined the patient and formulated the important aspects of the plan. I have edited the note to reflect any of my changes or salient points. I have personally discussed the plan with the patient and/or family.  Diuresing briskly on IV lasix . Breathing better  General:  Sitting up on side of bed bed. No resp difficulty HEENT: normal Neck: supple JVP 10  Cor: Regular rate & rhythm. No rubs, gallops or murmurs. Lungs: clear Abdomen: soft, nontender, nondistended.Good bowel sounds. Extremities: no cyanosis, clubbing, rash, 1-2+ edema  UNNA boots  Neuro: alert & orientedx3, cranial nerves grossly intact. moves all 4 extremities w/o difficulty. Affect pleasant  Diuresing well but still overloaded. Continue iv lasix . Add diamox . Watch renal function.   Toribio Fuel, MD  1:10 PM   "

## 2024-10-03 ENCOUNTER — Other Ambulatory Visit (HOSPITAL_COMMUNITY): Payer: Self-pay

## 2024-10-03 LAB — BASIC METABOLIC PANEL WITH GFR
Anion gap: 10 (ref 5–15)
BUN: 31 mg/dL — ABNORMAL HIGH (ref 6–20)
CO2: 38 mmol/L — ABNORMAL HIGH (ref 22–32)
Calcium: 10.2 mg/dL (ref 8.9–10.3)
Chloride: 88 mmol/L — ABNORMAL LOW (ref 98–111)
Creatinine, Ser: 2.3 mg/dL — ABNORMAL HIGH (ref 0.61–1.24)
GFR, Estimated: 35 mL/min — ABNORMAL LOW
Glucose, Bld: 88 mg/dL (ref 70–99)
Potassium: 3.9 mmol/L (ref 3.5–5.1)
Sodium: 137 mmol/L (ref 135–145)

## 2024-10-03 LAB — GLUCOSE, CAPILLARY
Glucose-Capillary: 131 mg/dL — ABNORMAL HIGH (ref 70–99)
Glucose-Capillary: 142 mg/dL — ABNORMAL HIGH (ref 70–99)
Glucose-Capillary: 146 mg/dL — ABNORMAL HIGH (ref 70–99)
Glucose-Capillary: 216 mg/dL — ABNORMAL HIGH (ref 70–99)

## 2024-10-03 MED ORDER — POTASSIUM CHLORIDE CRYS ER 20 MEQ PO TBCR
40.0000 meq | EXTENDED_RELEASE_TABLET | Freq: Once | ORAL | Status: AC
  Administered 2024-10-03: 40 meq via ORAL
  Filled 2024-10-03: qty 2

## 2024-10-03 MED ORDER — ACETAZOLAMIDE 250 MG PO TABS
500.0000 mg | ORAL_TABLET | Freq: Two times a day (BID) | ORAL | Status: AC
  Administered 2024-10-03 (×2): 500 mg via ORAL
  Filled 2024-10-03 (×2): qty 2

## 2024-10-03 MED ORDER — POTASSIUM CHLORIDE CRYS ER 20 MEQ PO TBCR
20.0000 meq | EXTENDED_RELEASE_TABLET | Freq: Once | ORAL | Status: AC
  Administered 2024-10-03: 20 meq via ORAL
  Filled 2024-10-03: qty 1

## 2024-10-03 NOTE — Progress Notes (Signed)
 " Progress Note   Patient: Justin [name] FMW:[id number] DOB: 1980-11-30 DOA: 09/29/2024     4 DOS: the patient was seen and examined on 10/03/2024   Brief hospital course: [name] was admitted to the hospital with the working diagnosis of heart failure decompensation   44 y.o. male with medical history significant of heart failure, hypertension, T2Dm, and  EtOH use, who presented with dyspnea and lower extremity edema.  Recent hospitalization 11/30 to 08/20/24 for heart failure exacerbation, complicated with cardiogenic shock. He required dobutamine  infusion, aggressive diuresis and direct current cardioversion. Apparently at home he run out of his medications, then developed progressive dyspnea and edema.  On his initial physical examination his blood pressure was 161/94, HR 109 RR 26 and 02 saturation 91%  Lungs with no wheezing or rhonchi, heart with S1 and S2 present and regular, abdomen soft and non tender, positive lower extremity edema.   Na 138, K 4,0 Cl 102 bicarbonate 26 glucose 123 bun 23 cr 1,85  BNP 1,408  Wbc 11,5 hgb 15.2 plt 242   Chest radiograph with cardiomegaly, bilateral hilar vascular congestion with bilateral interstitial and alveolar infiltrates, predominant at the lower lobes.   EKG 106 bpm, right axis deviation, right bundle branch block, qtc 502, sinus rhythm with left atrial enlargement, no significant ST segment or T wave changes   Patient was placed on IV furosemide  for diuresis   01/22 responding well to diuresis  01/23 continue to improve, possible discharge home tomorrow   Assessment and Plan: * Acute on chronic systolic CHF (congestive heart failure) (HCC) 03/2024 echocardiogram with reduced LV systolic function 20 to 25%, global hypokinesis, moderate LVH, fusion of EA, RV systolic function with severe reduction, RVSP 59.1 mmHg, moderate to severe mitral valve regurgitation, mild to moderate tricuspid valve regurgitation,   Urine  output is 5600 ml Systolic blood pressure 120 mmHg.   Plan to continue diuresis with furosemide  120 mg bid 01/21 metolazone  5 mg  01/22 acetazolamide  x2 500 mg 01/23 acetazolamide  x2  500 mg   Afterload reduction hydralazine  isosorbide   Continue metoprolol  succinate 25 mg   Essential hypertension Continue blood pressure control with hydralazine , isosorbide  and metoprolol .  Amlodipine  10 mg po daily   Paroxysmal atrial fibrillation with RVR (HCC) Continue metoprolol  and amiodarone  for rate and rhythm control, anticoagulation with rivaroxaban    CKD stage 3b, GFR 30-44 ml/min (HCC) Hypokalemia  Volume status is improving. Renal function today with serum cr at 2,30 with K at 3,9 and serum bicarbonate at 38  Na 137   Plan to add Kcl 60 meq x1 Follow up renal function and electrolytes in am Continue diuresis   Type 2 diabetes mellitus with hyperlipidemia (HCC) Continue basal insulin  30 units and insulin  sliding scale for glucose cover and monitoring Continue with with statin   Class 3 obesity (HCC) Calculated BMI is 49.1      Subjective: Patient with no chest pain and no dyspnea, edema continue to improve, no orthopnea or PND  Physical Exam: Vitals:   10/03/24 0929 10/03/24 0930 10/03/24 1125 10/03/24 1413  BP: 115/85  124/84 124/84  Pulse: 66  65   Resp:   17   Temp:   97.9 F (36.6 C)   TempSrc:   Oral   SpO2:  93% 92%   Weight:      Height:       Neurology awake and alert ENT with no pallor Cardiovascular with S1 and S2 present and regular with no  gallops, rubs or murmurs Respiratory with no rales or wheezing, no rhonchi Abdomen with no distention, soft and non tender Lower extremity edema + unna boots in place  Data Reviewed:    Family Communication: no family at the bedside   Disposition: Status is: Inpatient Remains inpatient appropriate because: IV diuresis   Planned Discharge Destination: Home    Author: Elidia Toribio Furnace, MD 10/03/2024  4:15 PM  For on call review [email or web address]  "

## 2024-10-03 NOTE — Progress Notes (Addendum)
 "    Advanced Heart Failure Rounding Note  Cardiologist: Shelda Bruckner, MD  [facility] Cardiologist: Dr. Rolan Chief Complaint: A/C CHF Patient Profile   Justin [name] is a 44 y.o. male with history of chronic systolic CHF d/t NICM (prior cardiac MRI concerning for possible sarcoid but pt never completed f/u PET), uncontrolled HTN, poorly controlled DM II, mitral regurgitation, ETOH abuse, hx cocaine use, Afib/AFL and poor compliance.   Significant events:   1/20: Admitted with A/C CHF. 35 lb up  Subjective:    5.6L UOP (net -4.6L). Weight down 11lbs (total 40lbs). sCr 2.07>2.00>2.30. K 3.9  Lying in bed. Feeling much better than yesterday. No shortness of breath this morning, edema has improved.   Objective:    Weight Range: 127.6 kg Body mass index is 45.42 kg/m.   Vital Signs:   Temp:  [97.5 F (36.4 C)-98.3 F (36.8 C)] 98.1 F (36.7 C) (01/23 0315) Pulse Rate:  [64-74] 71 (01/23 0315) Resp:  [18-20] 18 (01/23 0315) BP: (107-129)/(66-79) 124/66 (01/23 0315) SpO2:  [92 %-94 %] 92 % (01/23 0315) Weight:  [127.6 kg] 127.6 kg (01/23 0315) Last BM Date : 10/01/24  Weight change: Filed Weights   10/01/24 0414 10/02/24 0254 10/03/24 0315  Weight: (!) 138.2 kg 132.8 kg 127.6 kg   Intake/Output:  Intake/Output Summary (Last 24 hours) at 10/03/2024 0721 Last data filed at 10/03/2024 0315 Gross per 24 hour  Intake 960 ml  Output 5600 ml  Net -4640 ml    Physical Exam   General: Obese appearing. No distress  Cardiac: JVP difficult to assess. No murmurs  Resp: Lung sounds clear and equal B/L Extremities: Warm and dry.  1+ BLE edema. + unna Neuro: A&O x3. Affect pleasant.    Telemetry   SR 70s (personally reviewed)  Labs   CBC No results for input(s): WBC, NEUTROABS, HGB, HCT, MCV, PLT in the last 72 hours.  Basic Metabolic Panel Recent Labs    98/78/73 0933 10/02/24 0248 10/03/24 0329  NA 137 138 137  K 3.4* 3.5 3.9  CL  93* 90* 88*  CO2 36* 39* 38*  GLUCOSE 95 150* 88  BUN 30* 30* 31*  CREATININE 2.07* 2.00* 2.30*  CALCIUM  9.5 9.7 10.2  MG 2.2 2.1  --    BNP (last 3 results) Recent Labs    03/09/24 1845 03/24/24 1637 08/10/24 2148  BNP 2,578.5* 1,035.3* 193.5*   ProBNP (last 3 results) Recent Labs    09/29/24 0403  PROBNP 1,408.0*   Medications:    Scheduled Medications:  amiodarone   200 mg Oral QHS   amLODipine   10 mg Oral Daily   atorvastatin   20 mg Oral Daily   hydrALAZINE   50 mg Oral Q8H   insulin  aspart  0-15 Units Subcutaneous TID WC   insulin  glargine  30 Units Subcutaneous Daily   isosorbide  mononitrate  60 mg Oral Daily   metoprolol  succinate  25 mg Oral Daily   rivaroxaban   20 mg Oral Q supper   spironolactone   25 mg Oral Daily    Infusions:  furosemide  120 mg (10/02/24 1837)    PRN Medications: guaiFENesin -dextromethorphan  Assessment/Plan   Acute on chronic HFrEF: Long history of NiCM. Cath 6/23 with no significant CAD, CI 1.99.  Substance abuse (cocaine and ETOH) thought to play a role in cardiomyopathy, also uncontrolled HTN in the past.  Cardiac MRI 6/23: LVEF 29%, LV severely dilated, asymmetric LVH with basal septum measuring 17 mm, RVEF 45%, patchy LGE  in septum and RV insertion sites + subepicardial LGE in inferolateral wall. Scar pattern and asymmetric hypertrophy could be consistent with hypertrophic cardiomyopathy. However, due to subepicardial LGE in inferolateral wall and presence of mediastinal lymphadenopathy, cardiac PET recommended to rule out cardiac sarcoidosis.  Not done d/t lack of insurance.  Echo 7/25: EF 20-25% with mod concentric LVH, severe RV dysfunction, probably severe functional MR with PISA ERO 0.47 cm^2, IVC dilated.  Low output HF noted at 7/25 admission, required milrinone . Admitted 12/25 w/ a/c CHF w/ low output, in setting of recurrent AFL w/ RVR. Required DBA to diurese. TEE 12/4: EF 20-25%, LV severely dilated, mod LVH, RV mod reduced,  LA/RA severely dilated, mod MR.  - NYHA IV on admission.  - Remains 20lbs above last discharge weight 1 month ago, I think he is close to as diuresed as we can get him. Will diureses 1 more day and plan for early discharge tomorrow. - Continue lasix  120 mg IV BID + diamox  500 mg bid today - unna boots on - off spiro and ARB/ARN w/ elevated SCr - no SGLT2i given poor glycemic control, Hgb A1c 13.9 12/25  - continue low dose Toprol  25 mg daily - continue hydral 50 mg tid + imdur  to 60 mg daily (on 100 hydral tid and imdur  120 daily at home) - Would like to get a cardiac PET to rule out cardiac sarcoidosis, this is probably not an option with no insurance. - Not a candidate for advanced therapies with noncompliance, lack of insurance, RV dysfunction and CKD stage 4.   Atrial fibrillation/atypical flutter: Underwent TEE-guided DCCV back to [facility] 7/25 but was back in [facility] 12/25 admission. S/p repeat TEE/DC-CV 12/4.  - remains in NSR  - continue amiodarone  200 mg daily  - continue Xarelto  20 mg QHS - suspect has OSA, will be unable to perform sleep study w/o insurance   Hypertension - resolved; would not lower BP further with renal function - continue amlodipine  10 mg daily - GDMT changes as above   CKD stage IV: Suspect cardiorenal syndrome + DM2 + HTN.  - baseline SCr fluctuant 1.8-2.5 - follow with diuresis, avoid hypotension - no SGLT2i yet w/ poorly controlled DM    Mitral regurgitation: Severe functional MR. TEE 7/25 showed severe eccentric, posteriorly-directed MR with restricted posterior leaflet, MR ERO 0.6 cm^2 by PISA.  - continue HF/AFL optimization per above    ETOH/cocaine abuse: Says he has quit. UDS negative on 12/25 admission. UDS negative   DM2: Poor control.   - hgb a1c 13.9% - insulin  + glimepiride  - f/u w/ PCP    Length of Stay: 4  Jordan Lee, NP  10/03/2024, 7:21 AM  Advanced Heart Failure Team Pager [phone] (M-F; 7a - 5p)   Please visit [email or web address]: For  overnight coverage please call cardiology fellow first. If fellow not available call Shock/ECMO MD on call.  For ECMO / Mechanical Support (Impella, IABP, LVAD) issues call Shock / ECMO MD on call.   Patient seen and examined with the above-signed Advanced Practice Provider and/or Housestaff. I personally reviewed laboratory data, imaging studies and relevant notes. I independently examined the patient and formulated the important aspects of the plan. I have edited the note to reflect any of my changes or salient points. I have personally discussed the plan with the patient and/or family.  Diuresing well. Weight down 39 pounds. Scr up a little. Breathing better  General:  Sitting up on side of bed. No resp difficulty  HEENT: normal Neck: supple. JVP 8 Cor: Regular rate & rhythm. No rubs, gallops or murmurs. Lungs: clear Abdomen: obese soft, nontender, nondistended.Good bowel sounds. Extremities: no cyanosis, clubbing, rash, tr-1+ edema + UNNA Neuro: alert & orientedx3, cranial nerves grossly intact. moves all 4 extremities w/o difficulty. Affect pleasant  Continue IV diuresis one more day. Watch renal function. Potentially home tomorrow.   "

## 2024-10-03 NOTE — Plan of Care (Signed)
" °  Problem: Coping: Goal: Ability to adjust to condition or change in health will improve Outcome: Progressing   Problem: Fluid Volume: Goal: Ability to maintain a balanced intake and output will improve Outcome: Progressing   Problem: Education: Goal: Knowledge of General Education information will improve Description: Including pain rating scale, medication(s)/side effects and non-pharmacologic comfort measures Outcome: Progressing   Problem: Clinical Measurements: Goal: Diagnostic test results will improve Outcome: Progressing Goal: Respiratory complications will improve Outcome: Progressing   "

## 2024-10-03 NOTE — Plan of Care (Signed)
   Problem: Fluid Volume: Goal: Ability to maintain a balanced intake and output will improve Outcome: Progressing

## 2024-10-04 ENCOUNTER — Other Ambulatory Visit (HOSPITAL_COMMUNITY): Payer: Self-pay

## 2024-10-04 LAB — BASIC METABOLIC PANEL WITH GFR
Anion gap: 12 (ref 5–15)
BUN: 42 mg/dL — ABNORMAL HIGH (ref 6–20)
CO2: 33 mmol/L — ABNORMAL HIGH (ref 22–32)
Calcium: 9.2 mg/dL (ref 8.9–10.3)
Chloride: 91 mmol/L — ABNORMAL LOW (ref 98–111)
Creatinine, Ser: 3 mg/dL — ABNORMAL HIGH (ref 0.61–1.24)
GFR, Estimated: 25 mL/min — ABNORMAL LOW
Glucose, Bld: 142 mg/dL — ABNORMAL HIGH (ref 70–99)
Potassium: 3.5 mmol/L (ref 3.5–5.1)
Sodium: 136 mmol/L (ref 135–145)

## 2024-10-04 LAB — GLUCOSE, CAPILLARY
Glucose-Capillary: 150 mg/dL — ABNORMAL HIGH (ref 70–99)
Glucose-Capillary: 120 mg/dL — ABNORMAL HIGH (ref 70–99)

## 2024-10-04 LAB — MAGNESIUM: Magnesium: 2.4 mg/dL (ref 1.7–2.4)

## 2024-10-04 MED ORDER — TORSEMIDE 100 MG PO TABS
100.0000 mg | ORAL_TABLET | Freq: Every day | ORAL | Status: DC
  Filled 2024-10-04: qty 1

## 2024-10-04 MED ORDER — GLIMEPIRIDE 2 MG PO TABS
2.0000 mg | ORAL_TABLET | Freq: Every day | ORAL | 2 refills | Status: AC
  Filled 2024-10-04: qty 30, 30d supply, fill #0

## 2024-10-04 MED ORDER — RIVAROXABAN 20 MG PO TABS: 20.0000 mg | ORAL_TABLET | Freq: Every day | ORAL | Status: DC

## 2024-10-04 MED ORDER — TORSEMIDE 20 MG PO TABS
100.0000 mg | ORAL_TABLET | Freq: Every day | ORAL | 0 refills | Status: DC
  Filled 2024-10-04: qty 130, 26d supply, fill #0

## 2024-10-04 MED ORDER — INSULIN LISPRO (1 UNIT DIAL) 100 UNIT/ML (KWIKPEN)
5.0000 [IU] | PEN_INJECTOR | Freq: Three times a day (TID) | SUBCUTANEOUS | 0 refills | Status: AC
  Filled 2024-10-04: qty 3, 20d supply, fill #0

## 2024-10-04 MED ORDER — ISOSORBIDE MONONITRATE ER 120 MG PO TB24
120.0000 mg | ORAL_TABLET | Freq: Every day | ORAL | 0 refills | Status: DC
  Filled 2024-10-04: qty 60, 60d supply, fill #0

## 2024-10-04 MED ORDER — POTASSIUM CHLORIDE CRYS ER 20 MEQ PO TBCR
40.0000 meq | EXTENDED_RELEASE_TABLET | Freq: Every day | ORAL | 0 refills | Status: DC
  Filled 2024-10-04: qty 30, 15d supply, fill #0

## 2024-10-04 MED ORDER — RIVAROXABAN 15 MG PO TABS: 15.0000 mg | ORAL_TABLET | Freq: Every day | ORAL | Status: DC

## 2024-10-04 MED ORDER — BASAGLAR KWIKPEN 100 UNIT/ML ~~LOC~~ SOPN
30.0000 [IU] | PEN_INJECTOR | Freq: Every day | SUBCUTANEOUS | 0 refills | Status: AC
  Filled 2024-10-04: qty 9, 30d supply, fill #0

## 2024-10-04 MED ORDER — AMIODARONE HCL 200 MG PO TABS
200.0000 mg | ORAL_TABLET | Freq: Every day | ORAL | 0 refills | Status: DC
  Filled 2024-10-04: qty 30, 30d supply, fill #0

## 2024-10-04 MED ORDER — AMLODIPINE BESYLATE 10 MG PO TABS
10.0000 mg | ORAL_TABLET | Freq: Every day | ORAL | 0 refills | Status: DC
  Filled 2024-10-04: qty 60, 60d supply, fill #0

## 2024-10-04 MED ORDER — METOPROLOL SUCCINATE ER 25 MG PO TB24
25.0000 mg | ORAL_TABLET | Freq: Every day | ORAL | 0 refills | Status: DC
  Filled 2024-10-04: qty 30, 30d supply, fill #0

## 2024-10-04 MED ORDER — RIVAROXABAN 15 MG PO TABS
15.0000 mg | ORAL_TABLET | Freq: Every day | ORAL | 0 refills | Status: AC
  Filled 2024-10-04: qty 30, 30d supply, fill #0

## 2024-10-04 MED ORDER — ATORVASTATIN CALCIUM 20 MG PO TABS
20.0000 mg | ORAL_TABLET | Freq: Every day | ORAL | 2 refills | Status: DC
  Filled 2024-10-04: qty 30, 30d supply, fill #0

## 2024-10-04 MED ORDER — POTASSIUM CHLORIDE CRYS ER 20 MEQ PO TBCR: 40.0000 meq | EXTENDED_RELEASE_TABLET | Freq: Every day | ORAL | Status: DC

## 2024-10-04 MED ORDER — HYDRALAZINE HCL 50 MG PO TABS
50.0000 mg | ORAL_TABLET | Freq: Three times a day (TID) | ORAL | 0 refills | Status: DC
  Filled 2024-10-04: qty 90, 30d supply, fill #0

## 2024-10-04 MED ORDER — ISOSORBIDE MONONITRATE ER 60 MG PO TB24: 120.0000 mg | ORAL_TABLET | Freq: Every day | ORAL | Status: DC

## 2024-10-04 MED ORDER — BASAGLAR KWIKPEN 100 UNIT/ML ~~LOC~~ SOPN
35.0000 [IU] | PEN_INJECTOR | Freq: Every day | SUBCUTANEOUS | 0 refills | Status: DC
  Filled 2024-10-04: qty 9, 25d supply, fill #0

## 2024-10-04 NOTE — Discharge Summary (Signed)
 " Physician Discharge Summary   Patient: Justin [name] MRN: [id number] DOB: 1981-08-06  Admit date:     09/29/2024  Discharge date: 10/04/24  Discharge Physician: Elidia Sieving Athina Fahey   PCP: Celestia Rosaline SQUIBB, NP   Recommendations at discharge:    Patient will continue guideline directed medical therapy with isosorbide  and hydralazine  (reduced dose) for afterload reduction, added metoprolol  succinate. Holding on SGLT 2 inh and mineralocorticoid receptor blocker until renal function more stable.  Increased loop diuretic therapy to torsemide  100 mg po daily, and increased KCL supplementation 40 meq daily Reduced rivaroxaban  to 15 mg due to acutely reduced GFR Basal insulin  reduced to 30 units daily to avoid hypoglycemia.  Follow up renal function and electrolytes as outpatient in 7 days Follow up with Rosaline Celestia P NP in 7 to 10 days Follow up with Cardiology as scheduled    Discharge Diagnoses: Principal Problem:   Acute on chronic systolic CHF (congestive heart failure) (HCC) Active Problems:   Essential hypertension   Paroxysmal atrial fibrillation with RVR (HCC)   CKD stage 3b, GFR 30-44 ml/min (HCC)   Type 2 diabetes mellitus with hyperlipidemia (HCC)   Class 3 obesity (HCC)  Resolved Problems:   * No resolved hospital problems. [facility] Course: Justin [name] was admitted to the hospital with the working diagnosis of heart failure decompensation   44 y.o. male with medical history significant of heart failure, hypertension, and T2DM who presented with dyspnea and lower extremity edema.  Recent hospitalization 11/30 to 08/20/24 for heart failure exacerbation, complicated with cardiogenic shock. He required dobutamine  infusion, aggressive diuresis and direct current cardioversion. Apparently at home he run out of his medications, then developed progressive dyspnea and edema.  On his initial physical examination his blood pressure was 161/94, HR 109 RR  26 and 02 saturation 91%  Lungs with no wheezing or rhonchi, heart with S1 and S2 present and regular, abdomen soft and non tender, positive lower extremity edema.   Na 138, K 4,0 Cl 102 bicarbonate 26 glucose 123 bun 23 cr 1,85  BNP 1,408  Wbc 11,5 hgb 15.2 plt 242   Chest radiograph with cardiomegaly, bilateral hilar vascular congestion with bilateral interstitial and alveolar infiltrates, predominant at the lower lobes.   EKG 106 bpm, right axis deviation, right bundle branch block, qtc 502, sinus rhythm with left atrial enlargement, no significant ST segment or T wave changes   Patient was placed on IV furosemide  for diuresis   01/22 responding well to diuresis  01/23 continue to improve, possible discharge home tomorrow  01/24 improved volume status, plan for discharge home today and follow up as outpatient.  Assessment and Plan: * Acute on chronic systolic CHF (congestive heart failure) (HCC) 03/2024 echocardiogram with reduced LV systolic function 20 to 25%, global hypokinesis, moderate LVH, fusion of EA, RV systolic function with severe reduction, RVSP 59.1 mmHg, moderate to severe mitral valve regurgitation, mild to moderate tricuspid valve regurgitation,   Patient was placed on IV furosemide , had metolazone  and acetazolamide  for sequential nephron blockade.  Negative fluid balance was achieved, - 22,287 ml, with significant improvement in his symptoms   Afterload reduction hydralazine  isosorbide   Continue metoprolol  succinate 25 mg  Plan to continue diuresis at home with torsemide  100 mg po daily Will need close follow up as outpatient   Essential hypertension Continue blood pressure control with hydralazine , isosorbide  and metoprolol .  Amlodipine  10 mg po daily   Paroxysmal atrial fibrillation with RVR (HCC) Continue metoprolol   and amiodarone  for rate and rhythm control, anticoagulation with rivaroxaban    CKD stage 3b, GFR 30-44 ml/min (HCC) AKI Hypokalemia  Patient  had aggressive diuresis, sequential nephron blockade with acetazolamide , furosemide  and metolazone    At the time of discharge his volume status has improved.  Renal function with serum cr at 3,0 with K at 3,5 and serum bicarbonate at 33 Na 136 and Mg 2.4   Plan to continue diuresis with torsemide  100 mg po daily and follow up renal function and electrolytes as outpatient   Type 2 diabetes mellitus with hyperlipidemia (HCC) Continue basal insulin  30 units. During his hospitalization he was also placed on insulin  sliding scale. Glucose remained well controlled with fasting glucose on the day of discharge of 142 mg/dl.  At home will resume his insulin  regimen, long and short acting.  Continue with with statin   Class 3 obesity (HCC) Calculated BMI is 49.1       Consultants: cardiology  Procedures performed: none   Disposition: Home Diet recommendation:  Cardiac and Carb modified diet DISCHARGE MEDICATION: Allergies as of 10/04/2024   No Known Allergies      Medication List     STOP taking these medications    Eliquis 5 MG Tabs tablet Generic drug: apixaban       TAKE these medications    amiodarone  200 MG tablet Commonly known as: PACERONE  Take 1 tablet (200 mg total) by mouth at bedtime.   amLODipine  10 MG tablet Commonly known as: NORVASC  Tome 1 tableta (10 mg en total) por va oral diariamente. (Take 1 tablet (10 mg total) by mouth daily.)   atorvastatin  20 MG tablet Commonly known as: LIPITOR Tome 1 tableta (20 mg en total) por va oral diariamente. (Take 1 tablet (20 mg total) by mouth daily.)   Basaglar  KwikPen 100 UNIT/ML Inject 30 Units into the skin daily. What changed: how much to take   glimepiride  2 MG tablet Commonly known as: AMARYL  Tome 1 tableta (2 mg en total) por va oral diariamente. (Take 1 tablet (2 mg total) by mouth daily.)   hydrALAZINE  50 MG tablet Commonly known as: APRESOLINE  Take 1 tablet (50 mg total) by mouth every 8  (eight) hours. What changed:  medication strength how much to take when to take this   insulin  lispro 100 UNIT/ML KwikPen Commonly known as: HumaLOG  KwikPen Inject 5 Units into the skin 3 (three) times daily.   isosorbide  mononitrate 120 MG 24 hr tablet Commonly known as: IMDUR  Take 1 tablet (120 mg total) by mouth daily.   metoprolol  succinate 25 MG 24 hr tablet Commonly known as: TOPROL -XL Take 1 tablet (25 mg total) by mouth daily. Start taking on: October 05, 2024   potassium chloride  SA 20 MEQ tablet Commonly known as: KLOR-CON  M Take 2 tablets (40 mEq total) by mouth daily. Start taking on: October 05, 2024 What changed: See the new instructions.   Rivaroxaban  15 MG Tabs tablet Commonly known as: XARELTO  Take 1 tablet (15 mg total) by mouth daily with supper. What changed:  medication strength how much to take   TechLite Pen Needles 32G X 4 MM Misc Generic drug: Insulin  Pen Needle Use on Insulin  pens   torsemide  20 MG tablet Commonly known as: DEMADEX  Take 5 tablets (100 mg total) by mouth daily. What changed: how much to take   True Metrix Blood Glucose Test test strip Generic drug: glucose blood Use up to four times daily as directed. (FOR ICD-10 E10.9, E11.9).  TRUEdraw Lancing Device Misc Use as directed up to 4 times daily.   TRUEplus Lancets 28G Misc Use as directed up to four times daily.        Follow-up Information     Celestia Rosaline SQUIBB, NP Follow up on 10/13/2024.   Specialty: Internal Medicine Why: 8:50, please arrive at 8:35, please bring medications , ID and insurance card with co payment- at [facility] Resurgent address is [address] [place] [address]. Contact information: [address] [place] [address] [phone]         [facility] Follow up on 10/10/2024.   Specialty: Cardiology Why: at 10:00am [place], Entrance C Free Valet Parking  Available Contact information: [address] [place]  [address] [phone]               Discharge Exam: Fredricka Weights   10/02/24 0254 10/03/24 0315 10/04/24 0329  Weight: 132.8 kg 127.6 kg 124.4 kg   BP 109/79 (BP Location: Left Arm)   Pulse 67   Temp 98.3 F (36.8 C) (Oral)   Resp 20   Ht 5' 6 (1.676 m)   Wt 124.4 kg   SpO2 95%   BMI 44.27 kg/m   Patient with no chest pain and no dyspnea, no PND or orthopnea, lower extremity edema resolved  Neurology awake and alert ENT with no pallor Cardiovascular with S1 and S2 present and regular with no gallops, or rubs, positive systolic murmur a the apex.  No JVD Respiratory with no rales or wheezing, no rhonchi Abdomen with no distention, soft and non tender No lower extremity edema.   Condition at discharge: stable  The results of significant diagnostics from this hospitalization (including imaging, microbiology, ancillary and laboratory) are listed below for reference.   Imaging Studies: VAS US  LOWER EXTREMITY VENOUS (DVT) Result Date: 09/30/2024  Lower Venous DVT Study Patient Name:  JEANCARLOS MARCHENA  Date of Exam:   09/30/2024 Medical Rec #: [id number]                       Accession #:    [id number] Date of Birth: 27-Jan-1981                       Patient Gender: M Patient Age:   44 years Exam Location:  [facility] Procedure:      VAS US  LOWER EXTREMITY VENOUS (DVT) Referring Phys: DELILIAH RASHID --------------------------------------------------------------------------------  Indications: Pain.  Risk Factors: None identified. Limitations: Body habitus and poor ultrasound/tissue interface. Comparison Study: No prior studies. Performing Technologist: Cordella Collet RVT  Examination Guidelines: A complete evaluation includes B-mode imaging, spectral Doppler, color Doppler, and power Doppler as needed of all accessible portions of each vessel. Bilateral testing is considered an  integral part of a complete examination. Limited examinations for reoccurring indications may be performed as noted. The reflux portion of the exam is performed with the patient in reverse Trendelenburg.  +---------+---------------+---------+-----------+----------+-------------------+ RIGHT    CompressibilityPhasicitySpontaneityPropertiesThrombus Aging      +---------+---------------+---------+-----------+----------+-------------------+ CFV      Full           Yes      Yes                                      +---------+---------------+---------+-----------+----------+-------------------+ SFJ  Full                                                             +---------+---------------+---------+-----------+----------+-------------------+ FV Prox  Full                                                             +---------+---------------+---------+-----------+----------+-------------------+ FV Mid                  Yes      Yes                                      +---------+---------------+---------+-----------+----------+-------------------+ FV Distal               Yes      Yes                                      +---------+---------------+---------+-----------+----------+-------------------+ PFV      Full                                                             +---------+---------------+---------+-----------+----------+-------------------+ POP      Full           Yes      Yes                                      +---------+---------------+---------+-----------+----------+-------------------+ PTV      Full                                                             +---------+---------------+---------+-----------+----------+-------------------+ PERO                                                  Not well visualized +---------+---------------+---------+-----------+----------+-------------------+    +---------+---------------+---------+-----------+----------+-------------------+ LEFT     CompressibilityPhasicitySpontaneityPropertiesThrombus Aging      +---------+---------------+---------+-----------+----------+-------------------+ CFV      Full           Yes      Yes                                      +---------+---------------+---------+-----------+----------+-------------------+ SFJ      Full                                                             +---------+---------------+---------+-----------+----------+-------------------+  FV Prox  Full                                                             +---------+---------------+---------+-----------+----------+-------------------+ FV Mid                  Yes      Yes                                      +---------+---------------+---------+-----------+----------+-------------------+ FV Distal               Yes      Yes                                      +---------+---------------+---------+-----------+----------+-------------------+ PFV      Full                                                             +---------+---------------+---------+-----------+----------+-------------------+ POP      Full           Yes      Yes                                      +---------+---------------+---------+-----------+----------+-------------------+ PTV                                                   Not well visualized +---------+---------------+---------+-----------+----------+-------------------+ PERO                                                  Not well visualized +---------+---------------+---------+-----------+----------+-------------------+     Summary: RIGHT: - There is no evidence of deep vein thrombosis in the lower extremity. However, portions of this examination were limited- see technologist comments above.  - No cystic structure found in the popliteal fossa.  LEFT: - There is  no evidence of deep vein thrombosis in the lower extremity. However, portions of this examination were limited- see technologist comments above.  - No cystic structure found in the popliteal fossa.  *See table(s) above for measurements and observations. Electronically signed by Gaile New MD on 09/30/2024 at 12:44:57 PM.    Final    DG Chest Portable 1 View Result Date: 09/29/2024 CLINICAL DATA:  Shortness of breath. EXAM: PORTABLE CHEST 1 VIEW COMPARISON:  08/10/2024 FINDINGS: The cardio pericardial silhouette is enlarged. Diffuse bilateral airspace disease noted with some sparing in the apices. No substantial pleural effusion. No acute bony abnormality. IMPRESSION: Diffuse bilateral airspace disease with some sparing in the apices. Imaging features suggest edema although diffuse infection could have  this appearance. Electronically Signed   By: Camellia Candle M.D.   On: 09/29/2024 05:05    Microbiology: Results for orders placed or performed during the hospital encounter of 08/10/24  Resp panel by RT-PCR (RSV, Flu A&B, Covid) Anterior Nasal Swab     Status: None   Collection Time: 08/10/24  9:27 PM   Specimen: Anterior Nasal Swab  Result Value Ref Range Status   SARS Coronavirus 2 by RT PCR NEGATIVE NEGATIVE Final   Influenza A by PCR NEGATIVE NEGATIVE Final   Influenza B by PCR NEGATIVE NEGATIVE Final    Comment: (NOTE) The Xpert Xpress SARS-CoV-2/FLU/RSV plus assay is intended as an aid in the diagnosis of influenza from Nasopharyngeal swab specimens and should not be used as a sole basis for treatment. Nasal washings and aspirates are unacceptable for Xpert Xpress SARS-CoV-2/FLU/RSV testing.  Fact Sheet for Patients: bloggercourse.com  Fact Sheet for Healthcare Providers: seriousbroker.it  This test is not yet approved or cleared by the [place]  FDA and has been authorized for detection and/or diagnosis of SARS-CoV-2 by FDA  under an Emergency Use Authorization (EUA). This EUA will remain in effect (meaning this test can be used) for the duration of the COVID-19 declaration under Section 564(b)(1) of the Act, 21 U.S.C. section 360bbb-3(b)(1), unless the authorization is terminated or revoked.     Resp Syncytial Virus by PCR NEGATIVE NEGATIVE Final    Comment: (NOTE) Fact Sheet for Patients: bloggercourse.com  Fact Sheet for Healthcare Providers: seriousbroker.it  This test is not yet approved or cleared by the [place]  FDA and has been authorized for detection and/or diagnosis of SARS-CoV-2 by FDA under an Emergency Use Authorization (EUA). This EUA will remain in effect (meaning this test can be used) for the duration of the COVID-19 declaration under Section 564(b)(1) of the Act, 21 U.S.C. section 360bbb-3(b)(1), unless the authorization is terminated or revoked.  Performed at [facility], [address], [place], [place] [address]   Group A Strep by PCR     Status: None   Collection Time: 08/10/24  9:27 PM   Specimen: Anterior Nasal Swab; Sterile Swab  Result Value Ref Range Status   Group A Strep by PCR NOT DETECTED NOT DETECTED Final    Comment: Performed at [facility], [address] [place], [place] [address]  Respiratory (~20 pathogens) panel by PCR     Status: Abnormal   Collection Time: 08/11/24  1:32 AM   Specimen: Nasopharyngeal Swab; Respiratory  Result Value Ref Range Status   Adenovirus NOT DETECTED NOT DETECTED Final   Coronavirus 229E NOT DETECTED NOT DETECTED Final    Comment: (NOTE) The Coronavirus on the Respiratory Panel, DOES NOT test for the novel  Coronavirus (2019 nCoV)    Coronavirus HKU1 NOT DETECTED NOT DETECTED Final   Coronavirus NL63 NOT DETECTED NOT DETECTED Final   Coronavirus OC43 NOT DETECTED NOT DETECTED Final   Metapneumovirus NOT DETECTED NOT DETECTED Final   Rhinovirus / Enterovirus  DETECTED (A) NOT DETECTED Final   Influenza A NOT DETECTED NOT DETECTED Final   Influenza B NOT DETECTED NOT DETECTED Final   Parainfluenza Virus 1 NOT DETECTED NOT DETECTED Final   Parainfluenza Virus 2 NOT DETECTED NOT DETECTED Final   Parainfluenza Virus 3 NOT DETECTED NOT DETECTED Final   Parainfluenza Virus 4 NOT DETECTED NOT DETECTED Final   Respiratory Syncytial Virus NOT DETECTED NOT DETECTED Final   Bordetella pertussis NOT DETECTED NOT DETECTED Final  Bordetella Parapertussis NOT DETECTED NOT DETECTED Final   Chlamydophila pneumoniae NOT DETECTED NOT DETECTED Final   Mycoplasma pneumoniae NOT DETECTED NOT DETECTED Final    Comment: Performed at [facility], [address], [place], [place] [address]    Labs: CBC: Recent Labs  Lab 09/29/24 0403 09/30/24 0305  WBC 11.5* 6.9  HGB 15.2 14.1  HCT 48.7 45.6  MCV 95.3 95.2  PLT 242 219   Basic Metabolic Panel: Recent Labs  Lab 09/30/24 0305 10/01/24 0933 10/02/24 0248 10/03/24 0329 10/04/24 0254  NA 139 137 138 137 136  K 3.9 3.4* 3.5 3.9 3.5  CL 102 93* 90* 88* 91*  CO2 26 36* 39* 38* 33*  GLUCOSE 90 95 150* 88 142*  BUN 28* 30* 30* 31* 42*  CREATININE 1.94* 2.07* 2.00* 2.30* 3.00*  CALCIUM  8.9 9.5 9.7 10.2 9.2  MG 2.3 2.2 2.1  --  2.4   Liver Function Tests: No results for input(s): AST, ALT, ALKPHOS, BILITOT, PROT, ALBUMIN in the last 168 hours. CBG: Recent Labs  Lab 10/03/24 0604 10/03/24 1125 10/03/24 1603 10/03/24 2105 10/04/24 0608  GLUCAP 131* 142* 146* 216* 150*    Discharge time spent: greater than 30 minutes.  Signed: Elidia Toribio Furnace, MD [facility] 10/04/2024 "

## 2024-10-04 NOTE — Progress Notes (Signed)
"  °  Progress Note  Patient Name: Justin [name] Date of Encounter: 10/04/2024 [facility] Cardiologist: Shelda Bruckner, MD   Interval Summary   Feels much better Continues with excellent urine output, 5 L in last 24 hours, net -3.8 L.  Net diuresis almost 22 L since admission. Weight reduction is commensurate, roughly 21 kg from peak weight on 09/29/2024. Significant uptake in BUN and creatinine in last 24 hours.  Vital Signs Vitals:   10/04/24 0329 10/04/24 0713 10/04/24 0819 10/04/24 0824  BP: 113/89 98/62 109/79 109/79  Pulse: 80 69 68 67  Resp: 19 20    Temp: 98.9 F (37.2 C) 97.9 F (36.6 C)  98.3 F (36.8 C)  TempSrc: Oral Oral  Oral  SpO2: 97% 100%  95%  Weight: 124.4 kg     Height:        Intake/Output Summary (Last 24 hours) at 10/04/2024 0901 Last data filed at 10/04/2024 0716 Gross per 24 hour  Intake 980 ml  Output 5750 ml  Net -4770 ml      10/04/2024    3:29 AM 10/03/2024    3:15 AM 10/02/2024    2:54 AM  Last 3 Weights  Weight (lbs) 274 lb 4.8 oz 281 lb 6.4 oz 292 lb 12.8 oz  Weight (kg) 124.422 kg 127.642 kg 132.813 kg      Telemetry/ECG  Normal sinus rhythm- Personally Reviewed  Physical Exam  GEN: No acute distress.  Morbid obesity Neck: No JVD Cardiac: RRR, no murmurs, rubs, or gallops.  Respiratory: Clear to auscultation bilaterally. 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 Has had massive fluid gain with successful diuresis during this hospitalization, losing roughly 22 kg of fluid.  I think we have reached the limits of  aggressive diuresis.  Current weight is 124 kg (274 pounds).  Stop IV diuretics and restart torsemide  100 mg daily.    [facility] will sign off.   The patient is ready for discharge today from a cardiac standpoint. Medication Recommendations:   Amiodarone  200 mg once daily Amlodipine  10 mg daily Rivaroxaban  15 mg daily (patient has approved assistance for this, so preferred to Eliquis) Atorvastatin  20 mg daily Hydralazine  50 mg 3 times daily (this is a lower dose) Isosorbide  mononitrate 120 mg daily Potassium chloride  40 mEq daily Torsemide  100 mg daily (this is a higher dose) Other recommendations (labs, testing, etc): Repeat a basic metabolic panel in 1-2 weeks Follow up as an outpatient: 10/10/2024 10 AM with Rosaline Bohr, NP  For questions or updates, please contact [facility] Please consult [email or web address] for contact info under         Signed, Jerel Balding, MD   "

## 2024-10-04 NOTE — Care Management (Signed)
" °  Group 1, Grouped objectMATCH MEDICATION ASSISTANCE CARD   Pharmacies please call: [phone] for claim processing assistance.   Rx BIN: [id number]   Rx Group: [id number]   Rx PCN: [id number]   Relationship Code: 1   Person Code: [id number]   Patient ID (MRN): [id number]         Patient Name: Justin [name]         Patient DOB:1981/02/20         Discharge Date:1/24//26         Expiration Date:10/18/24 (must be filled within 7 days of discharge)     "

## 2024-10-06 ENCOUNTER — Telehealth: Payer: Self-pay | Admitting: *Deleted

## 2024-10-06 ENCOUNTER — Ambulatory Visit (HOSPITAL_COMMUNITY): Payer: Self-pay

## 2024-10-06 NOTE — Transitions of Care (Post Inpatient/ED Visit) (Signed)
" ° °  10/06/2024  Name: Justin [name] MRN: [id number] DOB: Feb 22, 1981  Today's TOC FU Call Status: Today's TOC FU Call Status:: Unsuccessful Call (1st Attempt) Unsuccessful Call (1st Attempt) Date: 10/06/24  Attempted to reach the patient regarding the most recent Inpatient/ED visit.  Call made while utilizing [employer] [phone], via [employer].  Follow Up Plan: Additional outreach attempts will be made to reach the patient to complete the Transitions of Care (Post Inpatient/ED visit) call.   Andrea Dimes RN, BSN [facility] RN Care Manager [phone]  "

## 2024-10-07 ENCOUNTER — Telehealth: Payer: Self-pay | Admitting: *Deleted

## 2024-10-07 NOTE — Transitions of Care (Post Inpatient/ED Visit) (Signed)
 "  10/07/2024  Name: Justin [name] MRN: [id number] DOB: [birth year]  Today's TOC FU Call Status: Today's TOC FU Call Status:: Successful TOC FU Call Completed TOC FU Call Complete Date: 10/07/24  Patient's Name and Date of Birth confirmed. Name, DOB  Transition Care Management Follow-up Telephone Call Date of Discharge: 10/04/24 Discharge Facility: [facility]) Type of Discharge: Inpatient Admission Primary Inpatient Discharge Diagnosis:: Acute on chronic systolic CHF How have you been since you were released from the hospital?: Better Any questions or concerns?: No  Items Reviewed: Did you receive and understand the discharge instructions provided?: Yes Medications obtained,verified, and reconciled?: Yes (Medications Reviewed) Any new allergies since your discharge?: No Dietary orders reviewed?: Yes Type of Diet Ordered:: heart healthy, low sodium Do you have support at home?: Yes People in Home [RPT]: other relative(s) Name of Support/Comfort Primary Source: Aunts and cousins  Medications Reviewed Today: Medications Reviewed Today     Reviewed by Lucky Andrea LABOR, RN (Registered Nurse) on 10/07/24 at 1358  Med List Status: <None>   Medication Order Taking? Sig Documenting Provider Last Dose Status Informant  amiodarone  (PACERONE ) 200 MG tablet [id number]  Take 1 tablet (200 mg total) by mouth at bedtime.  Patient not taking: Reported on 10/07/2024   Arrien, Mauricio Daniel, MD  Active   amLODipine  (NORVASC ) 10 MG tablet [id number]  Take 1 tablet (10 mg total) by mouth daily.  Patient not taking: Reported on 10/07/2024   Arrien, Mauricio Daniel, MD  Active   atorvastatin  (LIPITOR) 20 MG tablet [id number]  Take 1 tablet (20 mg total) by mouth daily.  Patient not taking: Reported on 10/07/2024   Arrien, Mauricio Daniel, MD  Active   Fingerstix Lancets MISC [id number]  Use as directed up to four times daily.  Patient not taking: Reported on 10/07/2024   Fairy Frames, MD  Active Self, Pharmacy Records  glimepiride  (AMARYL ) 2 MG tablet [id number]  Take 1 tablet (2 mg total) by mouth daily.  Patient not taking: Reported on 10/07/2024   Arrien, Mauricio Daniel, MD  Active   glucose blood (TRUE METRIX PRO BLOOD GLUCOSE) test strip [id number]  Use up to four times daily as directed. (FOR ICD-10 E10.9, E11.9).  Patient not taking: Reported on 10/07/2024   Fairy Frames, MD  Active Self, Pharmacy Records  hydrALAZINE  (APRESOLINE ) 50 MG tablet [id number] Yes Take 1 tablet (50 mg total) by mouth every 8 (eight) hours.  Patient taking differently: Take 50 mg by mouth every 8 (eight) hours. Taking one tablet a day   Arrien, Mauricio Daniel, MD  Active   Insulin  Glargine (BASAGLAR  [facility]) 100 UNIT/ML [id number] Yes Inject 30 Units into the skin daily. Arrien, Elidia Sieving, MD  Active   insulin  lispro (HUMALOG  [facility]) 100 UNIT/ML KwikPen [id number] Yes Inject 5 Units into the skin 3 (three) times daily. Arrien, Elidia Sieving, MD  Active   Insulin  Pen Needle (PEN NEEDLES) 32G X 4 MM MISC [id number] Yes Use on Insulin  pens Fairy Frames, MD  Active Self, Pharmacy Records  isosorbide  mononitrate (IMDUR ) 120 MG 24 hr tablet [id number]  Take 1 tablet (120 mg total) by mouth daily.  Patient not taking: Reported on 10/07/2024   Arrien, Mauricio Daniel, MD  Active   Lancet Device MISC [id number]  Use as directed up to 4 times daily.  Patient not taking: Reported on 10/07/2024   Fairy Frames, MD  Active Self, Pharmacy Records  metoprolol  succinate (TOPROL -XL) 25 MG 24 hr tablet [id number] Yes Take 1  tablet (25 mg total) by mouth daily. Arrien, Mauricio Daniel, MD  Active   potassium chloride  SA (KLOR-CON  M) 20 MEQ tablet [id number] Yes Take 2 tablets (40 mEq total) by mouth daily. Arrien, Elidia Sieving, MD  Active   Rivaroxaban  (XARELTO ) 15 MG TABS tablet [id number] Yes Take 1 tablet (15 mg total) by mouth daily with supper. Arrien, Mauricio Daniel, MD  Active    torsemide  (DEMADEX ) 20 MG tablet [id number] Yes Take 5 tablets (100 mg total) by mouth daily. Arrien, Elidia Sieving, MD  Active             Home Care and Equipment/Supplies: Were Home Health Services Ordered?: No Any new equipment or medical supplies ordered?: No  Functional Questionnaire: Do you need assistance with bathing/showering or dressing?: No Do you need assistance with meal preparation?: No Do you need assistance with eating?: No Do you have difficulty maintaining continence: No Do you need assistance with getting out of bed/getting out of a chair/moving?: No Do you have difficulty managing or taking your medications?: No  Follow up appointments reviewed: PCP Follow-up appointment confirmed?: Yes Date of PCP follow-up appointment?: 10/13/24 Follow-up Provider: PCP Specialist Hospital Follow-up appointment confirmed?: Yes Date of Specialist follow-up appointment?: 10/10/24 Follow-Up Specialty Provider:: HF Clinic Do you need transportation to your follow-up appointment?: No (Patient plans to ask a friend for transportation to upcoming appointments) Do you understand care options if your condition(s) worsen?: Yes-patient verbalized understanding  SDOH Interventions Today    Flowsheet Row Most Recent Value  SDOH Interventions   Food Insecurity Interventions Intervention Not Indicated  Housing Interventions Intervention Not Indicated  [Patient currently living with family. Recieved resources from BSW in [employer] Interventions Other (Comment)  [Patient to contact provider offices to request transportation to upcoming appointments if he is unable to procure transportation]  Utilities Interventions Intervention Not Indicated    Goals Addressed             This Visit's Progress    VBCI Transitions of Care (TOC) Care Plan       Problems:  Recent Hospitalization for treatment of CHF Knowledge Deficit Related to CHF and Medication management barrier  misunderstanding of which medications he should be taking  Goal:  Over the next 30 days, the patient will not experience hospital readmission  Interventions:  Transitions of Care: Doctor Visits  - discussed the importance of doctor visits Post discharge activity limitations prescribed by provider reviewed Medication review-advised taking all medications as directed, reviewed each medication in detail Communication with HF SW regarding possible transportation needs to upcoming appointment Communication to Diabetic Education scheduler requesting assistance with rescheduling missed appointment Diet reviewed Advised taking all medications to upcoming appointment at HF clinic on 10/10/24  Patient Self Care Activities:  Attend all scheduled provider appointments Call pharmacy for medication refills 3-7 days in advance of running out of medications Call provider office for new concerns or questions  Notify RN Care Manager of [facility] call rescheduling needs Participate in Transition of Care Program/Attend TOC scheduled calls Take medications as prescribed    Plan:  Telephone follow up appointment with care management team member scheduled for:  10/15/24 at 2:15pm       Discussed and offered 30 day TOC program.  Patient    enrolled .  The patient has been provided with contact information for the care management team and has been advised to call with any health -related questions or concerns.  The patient verbalized understanding with current plan of  care.  The patient is directed to their insurance card regarding availability of benefits coverage.    Andrea Dimes RN, BSN [place]  [facility] RN Care Manager [phone]  "

## 2024-10-08 NOTE — Progress Notes (Signed)
 "  Advanced Heart Failure Clinic Note    PCP: Celestia Rosaline SQUIBB, NP Primary Cardiologist: Shelda Bruckner, MD  HF Cardiologist: Dr. Rolan   HPI: [name] with history of chronic systolic CHF d/t NICM (prior cardiac MRI concerning for possible sarcoid but pt never completed f/u PET), uncontrolled HTN, poorly controlled DM II, mitral regurgitation, ETOH abuse, hx cocaine use, Afib/AFL and poor compliance.  Admitted 12/25 w/ a/c CHF w/ marked volume overload and low output and was back in AFL w/ RVR on admission. Initial co-ox 47%. Started on DBA + amio gtts. Respiratory panel + for rhinovirus. Diuresed w/ IV Lasix  then underwent TEE guided DCCV. TEE EF 20-25%, RV mod reduced, no LA thrombus. Had successful cardioversion back to NSR. He was transitioned to PO amiodarone  and weaned off DBA w/ stable co-ox. Transitioned to PO torsemide  and GDMT, though limited by CKD. He was not placed on an SGLT2i given poorly controlled DM, Hgb A1c 13. Discharge wt 267 lb.   Re-admitted 1/26 with a.c HF. Ran out of medications. Diuresed with IV lasix  >40 lbs. GDMT titrated but limited by CKD. Discharged home, weight 274 lbs.  Today he returns for post hospital HF follow up with interpretor. Overall feeling fine. No SOB with activity, can walk up steps without dyspnea. Denies palpitations, abnormal bleeding, CP, dizziness, edema, or PND/Orthopnea. Appetite ok. Not weighing at home, does not have a scale. Taking all medications. He snores. He is uninsured.  ECG (personally reviewed):  NSR 84 bpm  ReDs reading: 56 %, abnormal  Labs (6/25): LDL 30 Labs (7/25): K 3.6, creatinine 2.69 Labs (1/26): K 3.5, creatinine 3.0   PMH: 1. CKD stage 4 2. Hyperlipidemia 3. HTN 4. Atrial fibrillation/flutter: DCCV 7/25 & 12/25. 5. Prior ETOH/cocaine abuse 6. Chronic systolic CHF: Diagnosed in 2017. Nonischemic cardiomyopathy.  - LHC (2017): No CAD.  - Echo (2017): Severe LV dysfunction and  severe LVH.  - LHC (6/23): No significant CAD.  - Cardiac MRI (6/23): LVEF 29%, LV severely dilated, asymmetric LVH with basal septum measuring 17 mm, RVEF 45%, patchy LGE in septum and RV insertion sites + subepicardial LGE in inferolateral wall. Scar pattern and asymmetric hypertrophy could be consistent with hypertrophic cardiomyopathy. However, due to subepicardial LGE in inferolateral wall and presence of mediastinal lymphadenopathy, cardiac PET recommended to rule out cardiac sarcoidosis. - Echo (6/24): EF 20-25% gHK, GIIDD, RV mod reduced function, mild MVR - Echo (7/25): EF 20-25%, moderate LVH, LV with GHK, RA severely reduced, LA/RA severely dilated, mod-severe MR, mild-mod TR - TEE (7/25): EF 20-25%, mild LV dilation, mild LVH, posterior mitral leaflet restriction with severe functional MR, MR ERO 0.6 cm^2 by PISA.  - TEE (12/25): EF 20-25%, RV mod reduced, no LA thrombus. 7. Mitral regurgitation: Severe functional MR by TEE in 7/25; posterior mitral valve leaflet restriction with MR ERO 0.6 cm^2 by PISA.  8. Type 2 diabetes  Past Medical History:  Diagnosis Date   Heart failure with reduced ejection fraction [facility]) 2017   [facility]   Hypertension 2017   [facility]    Current Outpatient Medications  Medication Sig Dispense Refill   amiodarone  (PACERONE ) 200 MG tablet Take 1 tablet (200 mg total) by mouth at bedtime. (Patient not taking: Reported on 10/07/2024) 30 tablet 0   amLODipine  (NORVASC ) 10 MG tablet Take 1 tablet (10 mg total) by mouth daily. (Patient not taking: Reported on 10/07/2024) 60 tablet 0   atorvastatin  (LIPITOR) 20 MG tablet  Take 1 tablet (20 mg total) by mouth daily. (Patient not taking: Reported on 10/07/2024) 30 tablet 2   Fingerstix Lancets MISC Use as directed up to four times daily. (Patient not taking: Reported on 10/07/2024) 100 each 0   glimepiride  (AMARYL ) 2 MG tablet Take 1 tablet (2 mg total) by mouth daily. (Patient not taking:  Reported on 10/07/2024) 30 tablet 2   glucose blood (TRUE METRIX PRO BLOOD GLUCOSE) test strip Use up to four times daily as directed. (FOR ICD-10 E10.9, E11.9). (Patient not taking: Reported on 10/07/2024) 100 each 0   hydrALAZINE  (APRESOLINE ) 50 MG tablet Take 1 tablet (50 mg total) by mouth every 8 (eight) hours. (Patient taking differently: Take 50 mg by mouth every 8 (eight) hours. Taking one tablet a day) 90 tablet 0   Insulin  Glargine (BASAGLAR  KWIKPEN) 100 UNIT/ML Inject 30 Units into the skin daily. 9 mL 0   insulin  lispro (HUMALOG  KWIKPEN) 100 UNIT/ML KwikPen Inject 5 Units into the skin 3 (three) times daily. 3 mL 0   Insulin  Pen Needle (PEN NEEDLES) 32G X 4 MM MISC Use on Insulin  pens 100 each 0   isosorbide  mononitrate (IMDUR ) 120 MG 24 hr tablet Take 1 tablet (120 mg total) by mouth daily. (Patient not taking: Reported on 10/07/2024) 60 tablet 0   Lancet Device MISC Use as directed up to 4 times daily. (Patient not taking: Reported on 10/07/2024) 1 each 0   metoprolol  succinate (TOPROL -XL) 25 MG 24 hr tablet Take 1 tablet (25 mg total) by mouth daily. 30 tablet 0   potassium chloride  SA (KLOR-CON  M) 20 MEQ tablet Take 2 tablets (40 mEq total) by mouth daily. 30 tablet 0   Rivaroxaban  (XARELTO ) 15 MG TABS tablet Take 1 tablet (15 mg total) by mouth daily with supper. 30 tablet 0   torsemide  (DEMADEX ) 20 MG tablet Take 5 tablets (100 mg total) by mouth daily. 150 tablet 0   No current facility-administered medications for this visit.   Allergies[1]  Social History   Socioeconomic History   Marital status: Single    Spouse name: Not on file   Number of children: 3   Years of education: Not on file   Highest education level: 7th grade  Occupational History   Occupation: education administrator    Comment: varies  Tobacco Use   Smoking status: Never   Smokeless tobacco: Never  Vaping Use   Vaping status: Never Used  Substance and Sexual Activity   Alcohol use: Yes    Alcohol/week: 30.0  standard drinks of alcohol    Types: 30 Cans of beer per week    Comment: occ   Drug use: Not Currently    Types: Crack cocaine, Cocaine    Comment: 2 weeks   Sexual activity: Not on file  Other Topics Concern   Not on file  Social History Narrative   Not on file   Social Drivers of Health   Tobacco Use: Low Risk (09/29/2024)   Patient History    Smoking Tobacco Use: Never    Smokeless Tobacco Use: Never    Passive Exposure: Not on file  Financial Resource Strain: Not at Risk (05/07/2024)   Received from [employer]    How hard is it for you to pay for the very basics like food, housing, heating, medical care, and medications?: 1  Food Insecurity: No Food Insecurity (10/07/2024)   Epic    Worried About Running Out of Food in the Last  Year: Never true    Ran Out of Food in the Last Year: Never true  Recent Concern: Food Insecurity - Food Insecurity Present (08/11/2024)   Epic    Worried About Programme Researcher, Broadcasting/film/video in the Last Year: Sometimes true    Barista in the Last Year: Sometimes true  Transportation Needs: Unmet Transportation Needs (10/07/2024)   Epic    Lack of Transportation (Medical): Yes    Lack of Transportation (Non-Medical): Yes  Physical Activity: At Risk (05/07/2024)   Received from [facility]   Physical Activity    On average, how many minutes do you engage in exercise at this level?: 2  Stress: Not at Risk (05/07/2024)   Received from [facility]   Stress    Do you feel these kinds of stress these days?: 1  Social Connections: Not at Risk (05/07/2024)   Received from [facility]   Social Connections    How often do you see or talk to people that you care about and feel close to? (For example: talking to friends on phone, visiting friends or family, going to church or club meetings): 1  Intimate Partner Violence: Not At Risk (10/07/2024)   Epic    Fear of Current or Ex-Partner: No    Emotionally Abused: No    Physically Abused: No    Sexually  Abused: No  Depression (PHQ2-9): Low Risk (10/07/2024)   Depression (PHQ2-9)    PHQ-2 Score: 0  Alcohol Screen: Medium Risk (02/20/2022)   Alcohol Screen    Last Alcohol Screening Score (AUDIT): 12  Housing: High Risk (10/07/2024)   Epic    Unable to Pay for Housing in the Last Year: Yes    Number of Times Moved in the Last Year: Not on file    Homeless in the Last Year: No  Utilities: Not At Risk (10/07/2024)   Epic    Threatened with loss of utilities: No  Health Literacy: Inadequate Health Literacy (09/21/2023)   B1300 Health Literacy    Frequency of need for help with medical instructions: Often    Family History  Problem Relation Age of Onset   Hypertension Father    Wt Readings from Last 3 Encounters:  10/10/24 132 kg (291 lb)  10/04/24 124.4 kg (274 lb 4.8 oz)  09/23/24 (!) 139.7 kg (308 lb)   BP 134/82   Pulse 87   Wt 132 kg (291 lb)   SpO2 94%   BMI 46.97 kg/m   PHYSICAL EXAM: General:  NAD. No resp difficulty, walked into clinic HEENT: Normal Neck: Supple. Thick neck Cor: Regular rate & rhythm. No rubs, gallops or murmurs. Lungs: Clear Abdomen: Obese, nontender, nondistended.  Extremities: No cyanosis, clubbing, rash, trace BLE edema Neuro: Alert & oriented x 3, moves all 4 extremities w/o difficulty. Affect pleasant.  ASSESSMENT & PLAN: 1. Acute on Chronic HFrEF: Long history of NiCM. Cath 6/23 with no significant CAD, CI 1.99.  Substance abuse (cocaine and ETOH) thought to play a role in cardiomyopathy, also uncontrolled HTN in the past.  Cardiac MRI 6/23: LVEF 29%, LV severely dilated, asymmetric LVH with basal septum measuring 17 mm, RVEF 45%, patchy LGE in septum and RV insertion sites + subepicardial LGE in inferolateral wall. Scar pattern and asymmetric hypertrophy could be consistent with hypertrophic cardiomyopathy. However, due to subepicardial LGE in inferolateral wall and presence of mediastinal lymphadenopathy, cardiac PET recommended to rule out  cardiac sarcoidosis.  Not done d/t lack of insurance.  Echo 7/25: EF  20-25% with mod concentric LVH, severe RV dysfunction, probably severe functional MR with PISA ERO 0.47 cm^2, IVC dilated.  Low output HF noted at 7/25 admission, required milrinone . Admitted 12/25 w/ a/c CHF w/ low output, in setting of recurrent AFL w/ RVR. Required DBA to diurese. TEE 12/4: EF 20-25%, LV severely dilated, mod LVH, RV mod reduced, LA/RA severely dilated, mod MR. NYHA I today, despite being volume overloaded, weight up 17 lbs from discharge 6 days ago. ReDs 56% (body habitus likely contributing to elevated reading, too) - Increase torsemide  to 100/40, continue 40 KCL daily. BMET and pro-BNP today. - Continue Toprol  XL 25 mg daily. - Continue hydralazine  50 mg tid + Imdur  120 mg daily. - off spiro and ARB/ARN w/ elevated SCr - no SGLT2i given poor glycemic control, Hgb A1c 13.9 12/25  - Would like to get a cardiac PET to rule out cardiac sarcoidosis, this is probably not an option with no insurance. - Not a candidate for advanced therapies with noncompliance, lack of insurance, RV dysfunction and CKD stage 4. 2. Atrial fibrillation/atypical flutter: Underwent TEE-guided DCCV back to NSR 7/25 but was back in AFLon - Continue amiodarone  200 mg daily. Follow LFTs and TSH. He will need a regular eye exam while on amiodarone .  - Continue Xarelto  20 mg daily. No bleeding issues. CBC today. - He snores, suspect has OSA, will be unable to perform sleep study w/o insurance 3. Hypertension: BP well controlled. Avoid hypotension with renal disease. - Continue amlodipine  10 mg daily - needs sleep study. 4. CKD stage IV: Suspect cardiorenal syndrome + DM2 + HTN.  - Baseline SCr fluctuant 1.8-2.5. BMET today. - no SGLT2i yet w/ poorly controlled DM  5. Mitral regurgitation: Severe functional MR. TEE 7/25 showed severe eccentric, posteriorly-directed MR with restricted posterior leaflet, MR ERO 0.6 cm^2 by PISA.  - continue  HF/AFL optimization per above  6. ETOH/cocaine abuse: Says he has quit. UDS negative on 12/25 admission.  7. DM2: Poor control. Most recent hgb a1c 13.9% - On insulin . Management per PCP.   Follow up in 1-2 weeks with APP. Next step would be Furoscix , but he is uninsured.   Harlene Gainer, FNP-BC 10/10/24         [1] No Known Allergies  "

## 2024-10-09 ENCOUNTER — Telehealth (HOSPITAL_COMMUNITY): Payer: Self-pay | Admitting: Licensed Clinical Social Worker

## 2024-10-09 ENCOUNTER — Telehealth (HOSPITAL_COMMUNITY): Payer: Self-pay

## 2024-10-09 NOTE — Telephone Encounter (Signed)
 Called to confirm/remind patient of their appointment at the Advanced Heart Failure Clinic on 10/10/24.   Appointment:   [x] Confirmed  [] Left mess   [] No answer/No voice mail  [] VM Full/unable to leave message  [] Phone not in service  Patient reminded to bring all medications and/or complete list.  Confirmed patient has transportation. Gave directions, instructed to utilize valet parking.

## 2024-10-09 NOTE — Telephone Encounter (Signed)
 H&V Care Navigation CSW Progress Note  Clinical Social Worker consulted to assist with transportation to appt tomorrow.  CSW contacted patient via text and confirmed he is without a ride- able to arrange [employer] taxi to bring patient to appt- pick up from home set for 9:30am.  10/09/2024  Justin [name] DOB: March 12, 1981 MRN: [id number]   RENUNCIA Y EXENCIN DE RESPONSABILIDAD DEL PILOTO  Con el fin de ayudar con las necesidades de transporte, [employer] se asocia con proveedores de transporte externos (compaas de taxi, Pharmacist, Community, catering manager.) para prestarles a los pacientes de [employer] u otras personas aprobadas la opcin de viajes cuando lo necesiten (Servicios de transporte) a nuestros edificios  para visitas que no sean de associate professor.  Arvinmeritor de [place], yo, la persona que firma este documento, en mi nombre o en el de scientist, water quality legal (a mi cargo que use los Servicios  detransporte), acepto que:  Los Servicios de transporte que se me prestan los ofrecen proveedores de transporte externos e independientes que no trabajan ni tienen ninguna asociacin con [employer]. [place] no es una empresa de transporte. [place] no tiene ningn control sobre la calidad o la seguridad de los viajes que haga usando los Servicios de transporte. [place] no tiene control sobre si los viajes externos se harn a tiempo o no. [place] no garantiza la confiabilidad, calidad, seguridad o disponibilidad de ningn viaje, ni que no haya errores. S y acepto que viajar en vehculo (auto, camioneta, SUV, [place], autobs, taxi, etc.) implica riesgos de lesiones graves como discapacidad, parlisis y [place]. S y acepto que el riesgo de usar los Servicios de transporte es slo mo y no de [employer]. Los Servicios de transporte se prestan tal cual y segn la disponibilidad. Los proveedores de transporte se international aid/development worker de todas las revisiones y el cuidado de los vehculos usados  para radio producer estos viajes. Acepto no tomar acciones legales contra [place], sus agentes, empleados, funcionarios, directores, representantes, aseguradores, abogados, cesionarios, sucesores, subsidiarios ni asociados en cualquier momento por cualquier motivo relacionado directa o indirectamente con el uso de los Servicios de transporte. [place] acepto no tomar acciones legales contra [employer] o sus asociados por alguna lesin, muerte o dao a la propiedad ocasionado por o relacionado con el uso de los Servicios de transporte.  He ledo esta Renuncia y exencin de responsabilidad y comprendo los trminos aqu usados y su significado legal. Esta Renuncia se otorga libre y voluntariamente entendiendo que renuncio a sabiendas a mi associate professor (o el de scientist, research (medical)) de [employer] en relacin con los Servicios de transporte y el uso de estos servicios.  Doy fe de que lei la Exencion de viaje y exencion de responsabilidad a Lugene Beougher Villalobos [name], [place] a [name] [name] la oportunidad de technical sales engineer preguntas y respondi las preguntas formuladas (si as hubo).  Afirmo que [employer] Villalobos [name] a ensuite donne son consentement pur obtenir de l'adie [employer] transport.  Tykeem Lanzer H Tymara Saur

## 2024-10-10 ENCOUNTER — Other Ambulatory Visit: Payer: Self-pay

## 2024-10-10 ENCOUNTER — Other Ambulatory Visit (HOSPITAL_COMMUNITY): Payer: Self-pay

## 2024-10-10 ENCOUNTER — Ambulatory Visit (HOSPITAL_COMMUNITY)
Admission: RE | Admit: 2024-10-10 | Discharge: 2024-10-10 | Disposition: A | Payer: Self-pay | Attending: Family Medicine

## 2024-10-10 ENCOUNTER — Telehealth: Payer: Self-pay | Admitting: Primary Care

## 2024-10-10 ENCOUNTER — Encounter (HOSPITAL_COMMUNITY): Payer: Self-pay

## 2024-10-10 ENCOUNTER — Ambulatory Visit (HOSPITAL_COMMUNITY): Payer: Self-pay | Admitting: Family Medicine

## 2024-10-10 VITALS — BP 134/82 | HR 87 | Wt 291.0 lb

## 2024-10-10 DIAGNOSIS — Z79899 Other long term (current) drug therapy: Secondary | ICD-10-CM | POA: Insufficient documentation

## 2024-10-10 DIAGNOSIS — F1091 Alcohol use, unspecified, in remission: Secondary | ICD-10-CM | POA: Insufficient documentation

## 2024-10-10 DIAGNOSIS — Z5971 Insufficient health insurance coverage: Secondary | ICD-10-CM | POA: Insufficient documentation

## 2024-10-10 DIAGNOSIS — Z59868 Other specified financial insecurity: Secondary | ICD-10-CM | POA: Insufficient documentation

## 2024-10-10 DIAGNOSIS — I10 Essential (primary) hypertension: Secondary | ICD-10-CM

## 2024-10-10 DIAGNOSIS — I491 Atrial premature depolarization: Secondary | ICD-10-CM | POA: Insufficient documentation

## 2024-10-10 DIAGNOSIS — I340 Nonrheumatic mitral (valve) insufficiency: Secondary | ICD-10-CM | POA: Insufficient documentation

## 2024-10-10 DIAGNOSIS — F1411 Cocaine abuse, in remission: Secondary | ICD-10-CM | POA: Insufficient documentation

## 2024-10-10 DIAGNOSIS — Z8249 Family history of ischemic heart disease and other diseases of the circulatory system: Secondary | ICD-10-CM | POA: Insufficient documentation

## 2024-10-10 DIAGNOSIS — I130 Hypertensive heart and chronic kidney disease with heart failure and stage 1 through stage 4 chronic kidney disease, or unspecified chronic kidney disease: Secondary | ICD-10-CM | POA: Insufficient documentation

## 2024-10-10 DIAGNOSIS — F1910 Other psychoactive substance abuse, uncomplicated: Secondary | ICD-10-CM

## 2024-10-10 DIAGNOSIS — R0683 Snoring: Secondary | ICD-10-CM | POA: Insufficient documentation

## 2024-10-10 DIAGNOSIS — Z7901 Long term (current) use of anticoagulants: Secondary | ICD-10-CM | POA: Insufficient documentation

## 2024-10-10 DIAGNOSIS — Z7984 Long term (current) use of oral hypoglycemic drugs: Secondary | ICD-10-CM | POA: Insufficient documentation

## 2024-10-10 DIAGNOSIS — E1122 Type 2 diabetes mellitus with diabetic chronic kidney disease: Secondary | ICD-10-CM | POA: Insufficient documentation

## 2024-10-10 DIAGNOSIS — I5023 Acute on chronic systolic (congestive) heart failure: Secondary | ICD-10-CM | POA: Insufficient documentation

## 2024-10-10 DIAGNOSIS — I428 Other cardiomyopathies: Secondary | ICD-10-CM | POA: Insufficient documentation

## 2024-10-10 DIAGNOSIS — I4891 Unspecified atrial fibrillation: Secondary | ICD-10-CM | POA: Insufficient documentation

## 2024-10-10 DIAGNOSIS — E1165 Type 2 diabetes mellitus with hyperglycemia: Secondary | ICD-10-CM | POA: Insufficient documentation

## 2024-10-10 DIAGNOSIS — N183 Chronic kidney disease, stage 3 unspecified: Secondary | ICD-10-CM

## 2024-10-10 DIAGNOSIS — N184 Chronic kidney disease, stage 4 (severe): Secondary | ICD-10-CM | POA: Insufficient documentation

## 2024-10-10 DIAGNOSIS — Z5982 Transportation insecurity: Secondary | ICD-10-CM | POA: Insufficient documentation

## 2024-10-10 DIAGNOSIS — Z794 Long term (current) use of insulin: Secondary | ICD-10-CM | POA: Insufficient documentation

## 2024-10-10 DIAGNOSIS — I480 Paroxysmal atrial fibrillation: Secondary | ICD-10-CM

## 2024-10-10 LAB — CBC
HCT: 47.3 % (ref 39.0–52.0)
Hemoglobin: 15.4 g/dL (ref 13.0–17.0)
MCH: 29.3 pg (ref 26.0–34.0)
MCHC: 32.6 g/dL (ref 30.0–36.0)
MCV: 89.9 fL (ref 80.0–100.0)
Platelets: 252 10*3/uL (ref 150–400)
RBC: 5.26 MIL/uL (ref 4.22–5.81)
RDW: 13.4 % (ref 11.5–15.5)
WBC: 6.6 10*3/uL (ref 4.0–10.5)
nRBC: 0 % (ref 0.0–0.2)

## 2024-10-10 LAB — BASIC METABOLIC PANEL WITH GFR
Anion gap: 15 (ref 5–15)
BUN: 28 mg/dL — ABNORMAL HIGH (ref 6–20)
CO2: 23 mmol/L (ref 22–32)
Calcium: 9.1 mg/dL (ref 8.9–10.3)
Chloride: 102 mmol/L (ref 98–111)
Creatinine, Ser: 2.03 mg/dL — ABNORMAL HIGH (ref 0.61–1.24)
GFR, Estimated: 41 mL/min — ABNORMAL LOW
Glucose, Bld: 80 mg/dL (ref 70–99)
Potassium: 3.5 mmol/L (ref 3.5–5.1)
Sodium: 140 mmol/L (ref 135–145)

## 2024-10-10 LAB — PRO BRAIN NATRIURETIC PEPTIDE: Pro Brain Natriuretic Peptide: 1015 pg/mL — ABNORMAL HIGH

## 2024-10-10 MED ORDER — POTASSIUM CHLORIDE CRYS ER 20 MEQ PO TBCR
40.0000 meq | EXTENDED_RELEASE_TABLET | Freq: Every day | ORAL | 3 refills | Status: DC
  Filled 2024-10-10: qty 180, 90d supply, fill #0

## 2024-10-10 MED ORDER — HYDRALAZINE HCL 50 MG PO TABS
50.0000 mg | ORAL_TABLET | Freq: Three times a day (TID) | ORAL | 5 refills | Status: AC
  Filled 2024-10-10: qty 180, 60d supply, fill #0

## 2024-10-10 MED ORDER — ISOSORBIDE MONONITRATE ER 120 MG PO TB24
120.0000 mg | ORAL_TABLET | Freq: Every day | ORAL | 3 refills | Status: AC
  Filled 2024-10-10: qty 90, 90d supply, fill #0

## 2024-10-10 MED ORDER — TORSEMIDE 20 MG PO TABS
ORAL_TABLET | ORAL | 3 refills | Status: DC
  Filled 2024-10-10: qty 200, 29d supply, fill #0

## 2024-10-10 MED ORDER — AMIODARONE HCL 200 MG PO TABS
200.0000 mg | ORAL_TABLET | Freq: Every day | ORAL | 3 refills | Status: AC
  Filled 2024-10-10: qty 90, 90d supply, fill #0

## 2024-10-10 MED ORDER — METOPROLOL SUCCINATE ER 25 MG PO TB24
25.0000 mg | ORAL_TABLET | Freq: Every day | ORAL | 3 refills | Status: AC
  Filled 2024-10-10: qty 90, 90d supply, fill #0

## 2024-10-10 MED ORDER — AMLODIPINE BESYLATE 10 MG PO TABS
10.0000 mg | ORAL_TABLET | Freq: Every day | ORAL | 3 refills | Status: AC
  Filled 2024-10-10: qty 90, 90d supply, fill #0

## 2024-10-10 MED ORDER — ATORVASTATIN CALCIUM 20 MG PO TABS
20.0000 mg | ORAL_TABLET | Freq: Every day | ORAL | 2 refills | Status: AC
  Filled 2024-10-10: qty 30, 30d supply, fill #0

## 2024-10-10 NOTE — Addendum Note (Signed)
 Encounter addended by: Dante Jeannine HERO, CMA on: 10/10/2024 3:55 PM  Actions taken: Clinical Note Signed

## 2024-10-10 NOTE — Patient Instructions (Addendum)
 Cambie la dosis de torsemida a 100 mg (5 comprimidos) por la maana y 40 mg (2 comprimidos) por la noche.  Se le realizaron anlisis de laboratorio hoy; los resultados estarn Marketing Executive. Nos comunicaremos con usted si hay resultados anormales.  Realice lo siguiente TODOS los das: 1) Psese por la maana antes del desayuno. Anote su peso en un registro. 2) Tome sus medicamentos segn lo recetado. 3) Consuma alimentos bajos en sal; limite el consumo de sal (sodio) a 2000 mg por da. 4) Mantngase lo ms national [place]. 5) Limite el consumo total de lquidos a menos de 2 litros al c.h. robinson worldwide.  Su mdico recomienda que programe una cita de seguimiento segn lo previsto.  Si tiene alguna pregunta o inquietud antes de su prxima cita, envenos un mensaje a travs de MyChart o llame a nuestra clnica al [phone].  PARA DEJAR UN MENSAJE PARA LA ENFERMERA, SELECCIONE LA OPCIN 2. POR FAVOR, DEJE UN MENSAJE QUE INCLUYA:  SU NOMBRE  FECHA DE NACIMIENTO  NMERO DE TELFONO PARA DEVOLVER LA LLAMADA  MOTIVO DE LA LLAMADA**Esto es importante, ya que priorizamos las llamadas de acuerdo con el motivo. RECIBIR UNA LLAMADA EL MISMO DA SI LLAMA ANTES DE LAS 4:00 PM.  En la Clnica de [employer], usted y sus necesidades de salud son landry prioridad. Como parte de nuestra misin continua de brindarle una atencin cardaca excepcional, hemos creado equipos de atencin mdica especializados. Estos equipos incluyen a su cardilogo principal (mdico) y a proveedores de cabin crew (APPs: asistentes mdicos y enfermeros practicantes) que trabajan juntos para brindarle la atencin que necesita, cuando la necesita.  En su prxima cita de seguimiento, podr ver a cualquiera de los siguientes proveedores de su equipo de atencin:  Dr. Toribio Fuel  Dr. [name]  Dr. Morene Brownie  Amy Lenetta, NP  Caffie Shed, PA  Harlene Gainer, NP  Manuelita Dutch, [place]  Beckey Coe, NP  Jordan Lee, NP  Ellouise Class, NP  Tinnie Redman, PharmD  Jaun Bash, Pharm   Thank you for choosing [facility]

## 2024-10-10 NOTE — Addendum Note (Signed)
 Encounter addended by: Dante Jeannine HERO, CMA on: 10/10/2024 3:54 PM  Actions taken: Flowsheet accepted

## 2024-10-10 NOTE — Progress Notes (Signed)
"   ReDS Vest / Clip - 10/10/24 1500       ReDS Vest / Clip   Station Marker D    Ruler Value 39    ReDS Value Range High volume overload    ReDS Actual Value 56          "

## 2024-10-10 NOTE — Telephone Encounter (Signed)
 Called pt to confirm appt. Pt did not answer and could not LVM. If pt does call back please advise pt that appt is at the primary care at [facility] on Monday 10/14/24.

## 2024-10-13 ENCOUNTER — Inpatient Hospital Stay: Payer: Self-pay | Admitting: Primary Care

## 2024-10-15 ENCOUNTER — Other Ambulatory Visit (HOSPITAL_COMMUNITY): Payer: Self-pay

## 2024-10-15 ENCOUNTER — Telehealth (HOSPITAL_COMMUNITY): Payer: Self-pay | Admitting: Licensed Clinical Social Worker

## 2024-10-15 ENCOUNTER — Telehealth: Payer: Self-pay | Admitting: *Deleted

## 2024-10-15 ENCOUNTER — Encounter: Payer: Self-pay | Admitting: *Deleted

## 2024-10-15 MED ORDER — POTASSIUM CHLORIDE CRYS ER 20 MEQ PO TBCR
EXTENDED_RELEASE_TABLET | ORAL | 3 refills | Status: DC
  Filled 2024-10-15: qty 90, 30d supply, fill #0

## 2024-10-15 NOTE — Telephone Encounter (Signed)
 H&V Care Navigation CSW Progress Note  Clinical Social Worker received message from pt requesting help with transportation.  CSW arranged [employer] taxi for Friday appt- pick up from home set for 8am.  Andriette HILARIO Leech, LCSW Clinical Social Worker [facility] Desk#: [phone] Cell#: [phone]

## 2024-10-15 NOTE — Progress Notes (Signed)
 "  [facility] Note    PCP: Celestia Rosaline SQUIBB, NP Primary Cardiologist: Shelda Bruckner, MD  HF Cardiologist: Dr. Rolan   HPI: [name] with history of chronic systolic CHF d/t NICM (prior cardiac MRI concerning for possible sarcoid but pt never completed f/u PET), uncontrolled HTN, poorly controlled DM II, mitral regurgitation, ETOH abuse, hx cocaine use, Afib/AFL and poor compliance.  Admitted 12/25 w/ a/c CHF w/ marked volume overload and low output and was back in AFL w/ RVR on admission. Initial co-ox 47%. Started on DBA + amio gtts. Respiratory panel + for rhinovirus. Diuresed w/ IV Lasix  then underwent TEE guided DCCV. TEE EF 20-25%, RV mod reduced, no LA thrombus. Had successful cardioversion back to NSR. He was transitioned to PO amiodarone  and weaned off DBA w/ stable co-ox. Transitioned to PO torsemide  and GDMT, though limited by CKD. He was not placed on an SGLT2i given poorly controlled DM, Hgb A1c 13. Discharge wt 267 lb.   Re-admitted 1/26 with a.c HF. Ran out of medications. Diuresed with IV lasix  >40 lbs. GDMT titrated but limited by CKD. Discharged home, weight 274 lbs.  Today he returns for post hospital HF follow up with interpretor. Overall feeling fine. No SOB with activity, can walk up steps without dyspnea. Denies palpitations, abnormal bleeding, CP, dizziness, edema, or PND/Orthopnea. Appetite ok. Not weighing at home, does not have a scale. Taking all medications. He snores. He is uninsured.  ECG (personally reviewed):  NSR 84 bpm  ReDs reading: 56 %, abnormal  Labs (6/25): LDL 30 Labs (7/25): K 3.6, creatinine 2.69 Labs (1/26): K 3.5, creatinine 3.0   PMH: 1. CKD stage 4 2. Hyperlipidemia 3. HTN 4. Atrial fibrillation/flutter: DCCV 7/25 & 12/25. 5. Prior ETOH/cocaine abuse 6. Chronic systolic CHF: Diagnosed in 2017. Nonischemic cardiomyopathy.  - LHC (2017): No CAD.  - Echo (2017): Severe LV dysfunction and  severe LVH.  - LHC (6/23): No significant CAD.  - Cardiac MRI (6/23): LVEF 29%, LV severely dilated, asymmetric LVH with basal septum measuring 17 mm, RVEF 45%, patchy LGE in septum and RV insertion sites + subepicardial LGE in inferolateral wall. Scar pattern and asymmetric hypertrophy could be consistent with hypertrophic cardiomyopathy. However, due to subepicardial LGE in inferolateral wall and presence of mediastinal lymphadenopathy, cardiac PET recommended to rule out cardiac sarcoidosis. - Echo (6/24): EF 20-25% gHK, GIIDD, RV mod reduced function, mild MVR - Echo (7/25): EF 20-25%, moderate LVH, LV with GHK, RA severely reduced, LA/RA severely dilated, mod-severe MR, mild-mod TR - TEE (7/25): EF 20-25%, mild LV dilation, mild LVH, posterior mitral leaflet restriction with severe functional MR, MR ERO 0.6 cm^2 by PISA.  - TEE (12/25): EF 20-25%, RV mod reduced, no LA thrombus. 7. Mitral regurgitation: Severe functional MR by TEE in 7/25; posterior mitral valve leaflet restriction with MR ERO 0.6 cm^2 by PISA.  8. Type 2 diabetes  Past Medical History:  Diagnosis Date   Heart failure with reduced ejection fraction [facility]) 2017   [facility]   Hypertension 2017    [facility]    Current Outpatient Medications  Medication Sig Dispense Refill   amiodarone  (PACERONE ) 200 MG tablet Take 1 tablet (200 mg total) by mouth at bedtime. 90 tablet 3   amLODipine  (NORVASC ) 10 MG tablet Take 1 tablet (10 mg total) by mouth daily. 90 tablet 3   atorvastatin  (LIPITOR) 20 MG tablet Take 1 tablet (20 mg total) by mouth daily. 30 tablet 2  Fingerstix Lancets MISC Use as directed up to four times daily. 100 each 0   glimepiride  (AMARYL ) 2 MG tablet Take 1 tablet (2 mg total) by mouth daily. 30 tablet 2   glucose blood (TRUE METRIX PRO BLOOD GLUCOSE) test strip Use up to four times daily as directed. (FOR ICD-10 E10.9, E11.9). 100 each 0   hydrALAZINE  (APRESOLINE ) 50 MG tablet Take 1  tablet (50 mg total) by mouth 3 (three) times daily. 180 tablet 5   Insulin  Glargine (BASAGLAR  KWIKPEN) 100 UNIT/ML Inject 30 Units into the skin daily. 9 mL 0   insulin  lispro (HUMALOG  KWIKPEN) 100 UNIT/ML KwikPen Inject 5 Units into the skin 3 (three) times daily. 3 mL 0   Insulin  Pen Needle (PEN NEEDLES) 32G X 4 MM MISC Use on Insulin  pens 100 each 0   isosorbide  mononitrate (IMDUR ) 120 MG 24 hr tablet Take 1 tablet (120 mg total) by mouth daily. 90 tablet 3   Lancet Device MISC Use as directed up to 4 times daily. 1 each 0   metoprolol  succinate (TOPROL -XL) 25 MG 24 hr tablet Take 1 tablet (25 mg total) by mouth daily. 90 tablet 3   potassium chloride  SA (KLOR-CON  M) 20 MEQ tablet Take 2 tablets (40 mEq total) by mouth daily. 180 tablet 3   Rivaroxaban  (XARELTO ) 15 MG TABS tablet Take 1 tablet (15 mg total) by mouth daily with supper. 30 tablet 0   torsemide  (DEMADEX ) 20 MG tablet Take 5 tablets (100 mg total) by mouth every morning AND 2 tablets (40 mg total) every evening. 200 tablet 3   No current facility-administered medications for this visit.   Allergies[1]  Social History   Socioeconomic History   Marital status: Single    Spouse name: Not on file   Number of children: 3   Years of education: Not on file   Highest education level: 7th grade  Occupational History   Occupation: education administrator    Comment: varies  Tobacco Use   Smoking status: Never   Smokeless tobacco: Never  Vaping Use   Vaping status: Never Used  Substance and Sexual Activity   Alcohol use: Yes    Alcohol/week: 30.0 standard drinks of alcohol    Types: 30 Cans of beer per week    Comment: occ   Drug use: Not Currently    Types: Crack cocaine, Cocaine    Comment: 2 weeks   Sexual activity: Not on file  Other Topics Concern   Not on file  Social History Narrative   Not on file   Social Drivers of Health   Tobacco Use: Low Risk (10/10/2024)   Patient History    Smoking Tobacco Use: Never     Smokeless Tobacco Use: Never    Passive Exposure: Not on file  Financial Resource Strain: Not at Risk (05/07/2024)   Received from [employer]    How hard is it for you to pay for the very basics like food, housing, heating, medical care, and medications?: 1  Food Insecurity: No Food Insecurity (10/07/2024)   Epic    Worried About Programme Researcher, Broadcasting/film/video in the Last Year: Never true    Ran Out of Food in the Last Year: Never true  Recent Concern: Food Insecurity - Food Insecurity Present (08/11/2024)   Epic    Worried About Programme Researcher, Broadcasting/film/video in the Last Year: Sometimes true    Ran Out of Food in the Last Year: Sometimes true  Transportation Needs: Unmet Transportation Needs (10/09/2024)   Epic    Lack of Transportation (Medical): Yes    Lack of Transportation (Non-Medical): Yes  Physical Activity: At Risk (05/07/2024)   Received from [facility]   Physical Activity    On average, how many minutes do you engage in exercise at this level?: 2  Stress: Not at Risk (05/07/2024)   Received from [facility]   Stress    Do you feel these kinds of stress these days?: 1  Social Connections: Not at Risk (05/07/2024)   Received from [facility]   Social Connections    How often do you see or talk to people that you care about and feel close to? (For example: talking to friends on phone, visiting friends or family, going to church or club meetings): 1  Intimate Partner Violence: Not At Risk (10/07/2024)   Epic    Fear of Current or Ex-Partner: No    Emotionally Abused: No    Physically Abused: No    Sexually Abused: No  Depression (PHQ2-9): Low Risk (10/07/2024)   Depression (PHQ2-9)    PHQ-2 Score: 0  Alcohol Screen: Medium Risk (02/20/2022)   Alcohol Screen    Last Alcohol Screening Score (AUDIT): 12  Housing: High Risk (10/07/2024)   Epic    Unable to Pay for Housing in the Last Year: Yes    Number of Times Moved in the Last Year: Not on file    Homeless in the Last Year: No  Utilities:  Not At Risk (10/07/2024)   Epic    Threatened with loss of utilities: No  Health Literacy: Inadequate Health Literacy (09/21/2023)   B1300 Health Literacy    Frequency of need for help with medical instructions: Often    Family History  Problem Relation Age of Onset   Hypertension Father    Wt Readings from Last 3 Encounters:  10/10/24 132 kg (291 lb)  10/04/24 124.4 kg (274 lb 4.8 oz)  09/23/24 (!) 139.7 kg (308 lb)   There were no vitals taken for this visit.  PHYSICAL EXAM: General:  NAD. No resp difficulty, walked into clinic HEENT: Normal Neck: Supple. Thick neck Cor: Regular rate & rhythm. No rubs, gallops or murmurs. Lungs: Clear Abdomen: Obese, nontender, nondistended.  Extremities: No cyanosis, clubbing, rash, trace BLE edema Neuro: Alert & oriented x 3, moves all 4 extremities w/o difficulty. Affect pleasant.  ASSESSMENT & PLAN: 1. Acute on Chronic HFrEF: Long history of NiCM. Cath 6/23 with no significant CAD, CI 1.99.  Substance abuse (cocaine and ETOH) thought to play a role in cardiomyopathy, also uncontrolled HTN in the past.  Cardiac MRI 6/23: LVEF 29%, LV severely dilated, asymmetric LVH with basal septum measuring 17 mm, RVEF 45%, patchy LGE in septum and RV insertion sites + subepicardial LGE in inferolateral wall. Scar pattern and asymmetric hypertrophy could be consistent with hypertrophic cardiomyopathy. However, due to subepicardial LGE in inferolateral wall and presence of mediastinal lymphadenopathy, cardiac PET recommended to rule out cardiac sarcoidosis.  Not done d/t lack of insurance.  Echo 7/25: EF 20-25% with mod concentric LVH, severe RV dysfunction, probably severe functional MR with PISA ERO 0.47 cm^2, IVC dilated.  Low output HF noted at 7/25 admission, required milrinone . Admitted 12/25 w/ a/c CHF w/ low output, in setting of recurrent AFL w/ RVR. Required DBA to diurese. TEE 12/4: EF 20-25%, LV severely dilated, mod LVH, RV mod reduced, LA/RA severely  dilated, mod MR. NYHA I today, despite being volume overloaded, weight  up 17 lbs from discharge 6 days ago. ReDs 56% (body habitus likely contributing to elevated reading, too) - Increase torsemide  to 100/40, continue 40 KCL daily. BMET and pro-BNP today. - Continue Toprol  XL 25 mg daily. - Continue hydralazine  50 mg tid + Imdur  120 mg daily. - off spiro and ARB/ARN w/ elevated SCr - no SGLT2i given poor glycemic control, Hgb A1c 13.9 12/25  - Would like to get a cardiac PET to rule out cardiac sarcoidosis, this is probably not an option with no insurance. - Not a candidate for advanced therapies with noncompliance, lack of insurance, RV dysfunction and CKD stage 4. 2. Atrial fibrillation/atypical flutter: Underwent TEE-guided DCCV back to NSR 7/25 but was back in AFLon - Continue amiodarone  200 mg daily. Follow LFTs and TSH. He will need a regular eye exam while on amiodarone .  - Continue Xarelto  20 mg daily. No bleeding issues. CBC today. - He snores, suspect has OSA, will be unable to perform sleep study w/o insurance 3. Hypertension: BP well controlled. Avoid hypotension with renal disease. - Continue amlodipine  10 mg daily - needs sleep study. 4. CKD stage IV: Suspect cardiorenal syndrome + DM2 + HTN.  - Baseline SCr fluctuant 1.8-2.5. BMET today. - no SGLT2i yet w/ poorly controlled DM  5. Mitral regurgitation: Severe functional MR. TEE 7/25 showed severe eccentric, posteriorly-directed MR with restricted posterior leaflet, MR ERO 0.6 cm^2 by PISA.  - continue HF/AFL optimization per above  6. ETOH/cocaine abuse: Says he has quit. UDS negative on 12/25 admission.  7. DM2: Poor control. Most recent hgb a1c 13.9% - On insulin . Management per PCP.   Follow up in 1-2 weeks with APP. Next step would be Furoscix , but he is uninsured.   Harlene Gainer, FNP-BC 10/15/24         [1] No Known Allergies  "

## 2024-10-16 ENCOUNTER — Telehealth: Payer: Self-pay

## 2024-10-16 ENCOUNTER — Encounter: Payer: Self-pay | Admitting: *Deleted

## 2024-10-16 ENCOUNTER — Other Ambulatory Visit (HOSPITAL_COMMUNITY): Payer: Self-pay

## 2024-10-16 ENCOUNTER — Telehealth (HOSPITAL_COMMUNITY): Payer: Self-pay

## 2024-10-16 NOTE — Telephone Encounter (Signed)
 Called to confirm/remind patient of their appointment at the [facility] on 10/17/24.   Appointment:   [] Confirmed  [] Left mess   [x] No answer/No voice mail  [] VM Full/unable to leave message  [] Phone not in service

## 2024-10-17 ENCOUNTER — Ambulatory Visit (HOSPITAL_COMMUNITY): Payer: Self-pay | Admitting: Family Medicine

## 2024-10-17 ENCOUNTER — Telehealth: Payer: Self-pay | Admitting: *Deleted

## 2024-10-17 ENCOUNTER — Other Ambulatory Visit: Payer: Self-pay

## 2024-10-17 ENCOUNTER — Encounter (HOSPITAL_COMMUNITY): Payer: Self-pay

## 2024-10-17 ENCOUNTER — Other Ambulatory Visit (HOSPITAL_COMMUNITY): Payer: Self-pay

## 2024-10-17 ENCOUNTER — Ambulatory Visit (HOSPITAL_COMMUNITY): Admission: RE | Admit: 2024-10-17 | Payer: Self-pay

## 2024-10-17 VITALS — BP 126/90 | HR 84 | Wt 294.6 lb

## 2024-10-17 DIAGNOSIS — N183 Chronic kidney disease, stage 3 unspecified: Secondary | ICD-10-CM

## 2024-10-17 DIAGNOSIS — I10 Essential (primary) hypertension: Secondary | ICD-10-CM

## 2024-10-17 DIAGNOSIS — N184 Chronic kidney disease, stage 4 (severe): Secondary | ICD-10-CM

## 2024-10-17 DIAGNOSIS — I5032 Chronic diastolic (congestive) heart failure: Secondary | ICD-10-CM

## 2024-10-17 DIAGNOSIS — I480 Paroxysmal atrial fibrillation: Secondary | ICD-10-CM

## 2024-10-17 DIAGNOSIS — E1122 Type 2 diabetes mellitus with diabetic chronic kidney disease: Secondary | ICD-10-CM

## 2024-10-17 DIAGNOSIS — I340 Nonrheumatic mitral (valve) insufficiency: Secondary | ICD-10-CM

## 2024-10-17 DIAGNOSIS — Z794 Long term (current) use of insulin: Secondary | ICD-10-CM

## 2024-10-17 DIAGNOSIS — J9601 Acute respiratory failure with hypoxia: Secondary | ICD-10-CM

## 2024-10-17 DIAGNOSIS — I5022 Chronic systolic (congestive) heart failure: Secondary | ICD-10-CM

## 2024-10-17 DIAGNOSIS — F1910 Other psychoactive substance abuse, uncomplicated: Secondary | ICD-10-CM

## 2024-10-17 LAB — BASIC METABOLIC PANEL WITH GFR
Anion gap: 12 (ref 5–15)
BUN: 17 mg/dL (ref 6–20)
CO2: 30 mmol/L (ref 22–32)
Calcium: 8.8 mg/dL — ABNORMAL LOW (ref 8.9–10.3)
Chloride: 100 mmol/L (ref 98–111)
Creatinine, Ser: 1.9 mg/dL — ABNORMAL HIGH (ref 0.61–1.24)
GFR, Estimated: 44 mL/min — ABNORMAL LOW
Glucose, Bld: 107 mg/dL — ABNORMAL HIGH (ref 70–99)
Potassium: 3.7 mmol/L (ref 3.5–5.1)
Sodium: 142 mmol/L (ref 135–145)

## 2024-10-17 LAB — PRO BRAIN NATRIURETIC PEPTIDE: Pro Brain Natriuretic Peptide: 864 pg/mL — ABNORMAL HIGH

## 2024-10-17 MED ORDER — POTASSIUM CHLORIDE CRYS ER 20 MEQ PO TBCR
EXTENDED_RELEASE_TABLET | ORAL | 3 refills | Status: AC
  Filled 2024-10-17 (×2): qty 90, 15d supply, fill #0

## 2024-10-17 MED ORDER — METOLAZONE 2.5 MG PO TABS
2.5000 mg | ORAL_TABLET | Freq: Every day | ORAL | 0 refills | Status: AC
  Filled 2024-10-17 (×3): qty 2, 2d supply, fill #0

## 2024-10-17 MED ORDER — TORSEMIDE 20 MG PO TABS
ORAL_TABLET | ORAL | 3 refills | Status: AC
  Filled 2024-10-17 (×2): qty 200, 20d supply, fill #0

## 2024-10-17 NOTE — Patient Instructions (Addendum)
 Good to see you today!  INCREASE torsemide  to 100 mg Twice daily( 5 tablets twice a day)  TAKE metolazone  2.5 mg ( 1 tablet)  daily today  and tomorrow  Take an additional  potassium 20 meq today and tomorrow   INCREASE potassium to 60 mg (3 tablets)Twice daily   Labs done today, your results will be available in MyChart, we will contact you for abnormal readings.  Your physician recommends that you schedule a follow-up appointment  as scheduled  If you have any questions or concerns before your next appointment please send us  a message through [place] or call our office at [phone].    TO LEAVE A MESSAGE FOR THE NURSE SELECT OPTION 2, PLEASE LEAVE A MESSAGE INCLUDING: YOUR NAME DATE OF BIRTH CALL BACK NUMBER REASON FOR CALL**this is important as we prioritize the call backs  YOU WILL RECEIVE A CALL BACK THE SAME DAY AS LONG AS YOU CALL BEFORE 4:00 PM At the [facility], you and your health needs are our priority. As part of our continuing mission to provide you with exceptional heart care, we have created designated Provider Care Teams. These Care Teams include your primary Cardiologist (physician) and Advanced Practice Providers (APPs- Physician Assistants and Nurse Practitioners) who all work together to provide you with the care you need, when you need it.   You may see any of the following providers on your designated Care Team at your next follow up: Dr Toribio Fuel Dr [name] Dr. Morene Brownie Greig Mosses, NP Caffie Shed, [place] [facility], [place] Beckey Coe, NP Jordan Lee, NP Ellouise Class, NP Tinnie Redman, PharmD Jaun Bash, PharmD   Please be sure to bring in all your medications bottles to every appointment.    Thank you for choosing [facility]

## 2024-10-17 NOTE — Patient Instructions (Signed)
 Visit Information  Thank you for taking time to visit with me today. Please don't hesitate to contact me if I can be of assistance to you before our next scheduled telephone appointment.   Following is a copy of your care plan:   Goals Addressed             This Visit's Progress    VBCI Transitions of Care (TOC) Care Plan       Problems:  Recent Hospitalization for treatment of CHF Knowledge Deficit Related to CHF and Medication management barrier misunderstanding of which medications he should be taking  Goal:  Over the next 30 days, the patient will not experience hospital readmission  Interventions:  Transitions of Care: Doctor Visits  - discussed the importance of doctor visits Post discharge activity limitations prescribed by provider reviewed Medication review-advised taking all medications as directed, reviewed each medication in detail Assisted with rescheduling PCP appointment that was canceled due to weather, new appointment on 10/22/24-patient agrees Communication to Diabetic Education scheduler requesting assistance with rescheduling missed appointment Diet reviewed Reviewed the importance of daily weights Reviewed HF provider note and discussed Utilized Spanish Interpreter [phone] via [facility] [employer]   Patient Self Care Activities:  Attend all scheduled provider appointments Call pharmacy for medication refills 3-7 days in advance of running out of medications Call provider office for new concerns or questions  Notify RN Care Manager of TOC call rescheduling needs Participate in Transition of Care Program/Attend TOC scheduled calls Take medications as prescribed    Plan:  Telephone follow up appointment with care management team member scheduled for:  10/24/24 at 2pm        Patient verbalizes understanding of instructions and care plan provided today and agrees to view in MyChart. Active MyChart status and patient understanding of how to access  instructions and care plan via MyChart confirmed with patient.     Telephone follow up appointment with care management team member scheduled for:10/24/24 at 2pm  Please call the care guide team at [phone] if you need to cancel or reschedule your appointment.   Please call [phone] (toll free, 24 hour hotline) go to [facility] [address] [place] [phone]) call 911 if you are experiencing a Mental Health or Behavioral Health Crisis or need someone to talk to.  Andrea Dimes RN, BSN [place]  [facility] RN Care Manager [phone]

## 2024-10-17 NOTE — Progress Notes (Signed)
"   ReDS Vest / Clip - 10/17/24 0900       ReDS Vest / Clip   Station Marker D    Ruler Value 38    ReDS Value Range High volume overload    ReDS Actual Value 50          "

## 2024-10-17 NOTE — Transitions of Care (Post Inpatient/ED Visit) (Signed)
 " Transition of Care week 2  Visit Note  10/17/2024  Name: Justin [name] MRN: [id number]          DOB: [birth year]  Situation: Patient enrolled in [facility] 30-day program. Visit completed with [name] by telephone.   Call made while utilizing [employer] [phone], via [employer]  Background:   Initial Transition Care Management Follow-up Telephone Call Discharge Date and Diagnosis: 10/04/24, Acute on chronic systolic CHF   Past Medical History:  Diagnosis Date   Heart failure with reduced ejection fraction [facility]) 2017   [facility]   Hypertension 2017   [facility]    Assessment: Patient Reported Symptoms: Cognitive Cognitive Status: Able to follow simple commands, Alert and oriented to person, place, and time, Normal speech and language skills      Neurological Neurological Review of Symptoms: No symptoms reported    HEENT HEENT Symptoms Reported: No symptoms reported      Cardiovascular Cardiovascular Symptoms Reported: No symptoms reported Cardiovascular Management Strategies: Adequate rest, Coping strategies, Medical device, Medication therapy, Routine screening, Fluid modification Cardiovascular Self-Management Outcome: 4 (good) Cardiovascular Comment: Reviewed mediation changes made today during HF visit. Medications to be delivered to patient.  Respiratory Respiratory Symptoms Reported: No symptoms reported    Endocrine Endocrine Symptoms Reported: Not assessed    Gastrointestinal Gastrointestinal Symptoms Reported: No symptoms reported      Genitourinary Genitourinary Symptoms Reported: No symptoms reported    Integumentary Integumentary Symptoms Reported: No symptoms reported    Musculoskeletal Musculoskelatal Symptoms Reviewed: No symptoms reported        Psychosocial Psychosocial Symptoms Reported: Not assessed         There were no vitals filed for this visit. Pain Score: 0-No  pain  Medications Reviewed Today     Reviewed by Justin Andrea LABOR, RN (Registered Nurse) on 10/17/24 at 1220  Med List Status: <None>   Medication Order Taking? Sig Documenting Provider Last Dose Status Informant  amiodarone  (PACERONE ) 200 MG tablet [id number] Yes Take 1 tablet (200 mg total) by mouth at bedtime. [place], [place], [place]  Active   amLODipine  (NORVASC ) 10 MG tablet [id number] Yes Take 1 tablet (10 mg total) by mouth daily. [place], [place], [place]  Active   atorvastatin  (LIPITOR) 20 MG tablet [id number] Yes Take 1 tablet (20 mg total) by mouth daily. Glena Raisin [place], [place]  Active   Fingerstix Lancets MISC [id number]  Use as directed up to four times daily. Fairy Frames, MD  Active Self, Pharmacy Records  glimepiride  (AMARYL ) 2 MG tablet [id number] Yes Take 1 tablet (2 mg total) by mouth daily. Arrien, Elidia Sieving, MD  Active   glucose blood (TRUE METRIX PRO BLOOD GLUCOSE) test strip [id number]  Use up to four times daily as directed. (FOR ICD-10 E10.9, E11.9).  Patient not taking: Reported on 10/17/2024   Fairy Frames, MD  Active Self, Pharmacy Records  hydrALAZINE  (APRESOLINE ) 50 MG tablet [id number] Yes Take 1 tablet (50 mg total) by mouth 3 (three) times daily. [place], [place], [place]  Active   Insulin  Glargine (BASAGLAR  KWIKPEN) 100 UNIT/ML [id number] Yes Inject 30 Units into the skin daily. Arrien, Elidia Sieving, MD  Active            Med Note [facility], JEANNINE HERO   Fri Oct 10, 2024  9:57 AM) Patient takes 35 units Daily.  insulin  lispro (HUMALOG  KWIKPEN) 100 UNIT/ML KwikPen [id number] Yes Inject 5 Units into the skin 3 (three) times daily. Arrien,  Elidia Sieving, MD  Active   Insulin  Pen Needle (PEN NEEDLES) 32G X 4 MM MISC [id number]  Use on Insulin  pens  Patient not taking: Reported on 10/17/2024   Fairy Frames, MD  Active Self, Pharmacy Records  isosorbide  mononitrate (IMDUR ) 120 MG 24 hr tablet [id number] Yes Take 1 tablet (120 mg total) by mouth daily. Glena Raisin  [place], [place]  Active   Lancet Device MISC [id number]  Use as directed up to 4 times daily.  Patient not taking: Reported on 10/17/2024   Fairy Frames, MD  Active Self, Pharmacy Records  metolazone  (ZAROXOLYN ) 2.5 MG tablet [id number]  Take 1 tablet (2.5 mg total) by mouth daily. [place], Raisin HERO, [place]  Active   metoprolol  succinate (TOPROL -XL) 25 MG 24 hr tablet [id number] Yes Take 1 tablet (25 mg total) by mouth daily. [place], Raisin [place], [place]  Active   potassium chloride  SA (KLOR-CON  M) 20 MEQ tablet [id number]  Take 3 tablets (60 mEq total) by mouth every morning AND 3 tablets (60 mEq total) every evening. [place], [place], [place]  Active   Rivaroxaban  (XARELTO ) 15 MG TABS tablet [id number] Yes Take 1 tablet (15 mg total) by mouth daily with supper. Arrien, Elidia Sieving, MD  Active   torsemide  (DEMADEX ) 20 MG tablet [id number]  Take 5 tablets (100 mg total) by mouth every morning AND 5 tablets (100 mg total) every evening. Glena Raisin [place], [place]  Active             Recommendation:   Continue Current Plan of Care  Follow Up Plan:   Telephone follow-up in 1 week  Andrea Dimes RN, BSN [place]  [facility] RN Care Manager [phone]     "

## 2024-10-22 ENCOUNTER — Inpatient Hospital Stay (INDEPENDENT_AMBULATORY_CARE_PROVIDER_SITE_OTHER): Payer: Self-pay | Admitting: Primary Care

## 2024-10-24 ENCOUNTER — Telehealth: Payer: Self-pay | Admitting: *Deleted

## 2024-10-27 ENCOUNTER — Ambulatory Visit (HOSPITAL_COMMUNITY): Payer: Self-pay
